# Patient Record
Sex: Female | Born: 1950
Health system: Southern US, Community
[De-identification: ages and names within clinical notes are randomized; demographics above are authoritative.]

## PROBLEM LIST (undated history)

## (undated) DIAGNOSIS — F5104 Psychophysiologic insomnia: Secondary | ICD-10-CM

## (undated) DIAGNOSIS — G4730 Sleep apnea, unspecified: Secondary | ICD-10-CM

## (undated) DIAGNOSIS — F329 Major depressive disorder, single episode, unspecified: Secondary | ICD-10-CM

## (undated) DIAGNOSIS — F32A Depression, unspecified: Secondary | ICD-10-CM

## (undated) DIAGNOSIS — R569 Unspecified convulsions: Secondary | ICD-10-CM

## (undated) DIAGNOSIS — F419 Anxiety disorder, unspecified: Secondary | ICD-10-CM

## (undated) DIAGNOSIS — I10 Essential (primary) hypertension: Secondary | ICD-10-CM

## (undated) DIAGNOSIS — G009 Bacterial meningitis, unspecified: Secondary | ICD-10-CM

## (undated) DIAGNOSIS — E785 Hyperlipidemia, unspecified: Secondary | ICD-10-CM

## (undated) DIAGNOSIS — G459 Transient cerebral ischemic attack, unspecified: Secondary | ICD-10-CM

## (undated) DIAGNOSIS — H269 Unspecified cataract: Secondary | ICD-10-CM

## (undated) DIAGNOSIS — G4733 Obstructive sleep apnea (adult) (pediatric): Secondary | ICD-10-CM

## (undated) DIAGNOSIS — Z9289 Personal history of other medical treatment: Secondary | ICD-10-CM

## (undated) DIAGNOSIS — R269 Unspecified abnormalities of gait and mobility: Secondary | ICD-10-CM

## (undated) DIAGNOSIS — R413 Other amnesia: Secondary | ICD-10-CM

## (undated) HISTORY — DX: Other amnesia: R41.3

## (undated) HISTORY — DX: Major depressive disorder, single episode, unspecified: F32.9

## (undated) HISTORY — DX: Hyperlipidemia, unspecified: E78.5

## (undated) HISTORY — DX: Essential (primary) hypertension: I10

## (undated) HISTORY — DX: Anxiety disorder, unspecified: F41.9

## (undated) HISTORY — DX: Unspecified convulsions: R56.9

## (undated) HISTORY — DX: Unspecified abnormalities of gait and mobility: R26.9

## (undated) HISTORY — DX: Sleep apnea, unspecified: G47.30

## (undated) HISTORY — DX: Depression, unspecified: F32.A

## (undated) HISTORY — DX: Obstructive sleep apnea (adult) (pediatric): G47.33

## (undated) HISTORY — DX: Unspecified cataract: H26.9

## (undated) HISTORY — DX: Bacterial meningitis, unspecified: G00.9

## (undated) HISTORY — DX: Personal history of other medical treatment: Z92.89

## (undated) HISTORY — DX: Psychophysiologic insomnia: F51.04

## (undated) HISTORY — PX: NO PAST SURGERIES: SHX2092

---

## 1999-12-08 ENCOUNTER — Encounter: Admission: RE | Admit: 1999-12-08 | Discharge: 1999-12-08 | Payer: Self-pay | Admitting: Family Medicine

## 1999-12-08 ENCOUNTER — Encounter: Payer: Self-pay | Admitting: Family Medicine

## 2000-12-15 ENCOUNTER — Encounter: Admission: RE | Admit: 2000-12-15 | Discharge: 2000-12-15 | Payer: Self-pay | Admitting: Family Medicine

## 2000-12-15 ENCOUNTER — Encounter: Payer: Self-pay | Admitting: Family Medicine

## 2001-12-19 ENCOUNTER — Encounter: Payer: Self-pay | Admitting: Family Medicine

## 2001-12-19 ENCOUNTER — Encounter: Admission: RE | Admit: 2001-12-19 | Discharge: 2001-12-19 | Payer: Self-pay | Admitting: Family Medicine

## 2002-12-26 ENCOUNTER — Encounter: Payer: Self-pay | Admitting: Family Medicine

## 2002-12-26 ENCOUNTER — Encounter: Admission: RE | Admit: 2002-12-26 | Discharge: 2002-12-26 | Payer: Self-pay | Admitting: Family Medicine

## 2003-07-03 ENCOUNTER — Ambulatory Visit (HOSPITAL_COMMUNITY): Admission: RE | Admit: 2003-07-03 | Discharge: 2003-07-03 | Payer: Self-pay | Admitting: *Deleted

## 2004-01-01 ENCOUNTER — Ambulatory Visit (HOSPITAL_COMMUNITY): Admission: RE | Admit: 2004-01-01 | Discharge: 2004-01-01 | Payer: Self-pay | Admitting: Family Medicine

## 2005-01-21 ENCOUNTER — Encounter: Admission: RE | Admit: 2005-01-21 | Discharge: 2005-01-21 | Payer: Self-pay | Admitting: Internal Medicine

## 2005-03-12 ENCOUNTER — Ambulatory Visit: Payer: Self-pay | Admitting: Internal Medicine

## 2005-04-02 ENCOUNTER — Encounter (INDEPENDENT_AMBULATORY_CARE_PROVIDER_SITE_OTHER): Payer: Self-pay | Admitting: *Deleted

## 2005-04-02 ENCOUNTER — Ambulatory Visit: Payer: Self-pay | Admitting: Internal Medicine

## 2006-01-24 ENCOUNTER — Encounter: Admission: RE | Admit: 2006-01-24 | Discharge: 2006-01-24 | Payer: Self-pay | Admitting: Internal Medicine

## 2007-05-08 ENCOUNTER — Encounter: Admission: RE | Admit: 2007-05-08 | Discharge: 2007-05-08 | Payer: Self-pay | Admitting: Internal Medicine

## 2008-05-08 ENCOUNTER — Encounter: Admission: RE | Admit: 2008-05-08 | Discharge: 2008-05-08 | Payer: Self-pay | Admitting: Internal Medicine

## 2009-05-15 ENCOUNTER — Encounter: Admission: RE | Admit: 2009-05-15 | Discharge: 2009-05-15 | Payer: Self-pay | Admitting: Internal Medicine

## 2010-05-18 ENCOUNTER — Encounter
Admission: RE | Admit: 2010-05-18 | Discharge: 2010-05-18 | Payer: Self-pay | Source: Home / Self Care | Attending: Internal Medicine | Admitting: Internal Medicine

## 2011-04-14 ENCOUNTER — Encounter (INDEPENDENT_AMBULATORY_CARE_PROVIDER_SITE_OTHER): Payer: Self-pay | Admitting: Internal Medicine

## 2011-04-14 DIAGNOSIS — E7800 Pure hypercholesterolemia, unspecified: Secondary | ICD-10-CM

## 2011-04-14 DIAGNOSIS — Z0000 Encounter for general adult medical examination without abnormal findings: Secondary | ICD-10-CM

## 2011-04-14 DIAGNOSIS — I10 Essential (primary) hypertension: Secondary | ICD-10-CM

## 2011-04-21 ENCOUNTER — Other Ambulatory Visit: Payer: Self-pay | Admitting: Internal Medicine

## 2011-04-21 DIAGNOSIS — Z1231 Encounter for screening mammogram for malignant neoplasm of breast: Secondary | ICD-10-CM

## 2011-04-29 ENCOUNTER — Ambulatory Visit (HOSPITAL_COMMUNITY)
Admission: RE | Admit: 2011-04-29 | Discharge: 2011-04-29 | Disposition: A | Discharge status: Home / Self Care | Payer: Federal, State, Local not specified - PPO | Source: Ambulatory Visit | Attending: Internal Medicine | Admitting: Internal Medicine

## 2011-04-29 DIAGNOSIS — E785 Hyperlipidemia, unspecified: Secondary | ICD-10-CM | POA: Insufficient documentation

## 2011-04-29 DIAGNOSIS — R0989 Other specified symptoms and signs involving the circulatory and respiratory systems: Secondary | ICD-10-CM

## 2011-04-29 NOTE — Progress Notes (Signed)
*  PRELIMINARY RESULTS* Carotid Duplex completed. No evidence of hemodynamically significant stenosis. Vertebral arteries are patent and flow is antegrade.  [name] [name], [place] D 04/29/2011, 11:27 AM

## 2011-05-20 ENCOUNTER — Ambulatory Visit
Admission: RE | Admit: 2011-05-20 | Discharge: 2011-05-20 | Disposition: A | Discharge status: Home / Self Care | Payer: Self-pay | Source: Ambulatory Visit | Attending: Internal Medicine | Admitting: Internal Medicine

## 2011-05-20 DIAGNOSIS — Z1231 Encounter for screening mammogram for malignant neoplasm of breast: Secondary | ICD-10-CM

## 2011-10-20 ENCOUNTER — Encounter: Payer: Self-pay | Admitting: Internal Medicine

## 2011-10-20 ENCOUNTER — Ambulatory Visit (INDEPENDENT_AMBULATORY_CARE_PROVIDER_SITE_OTHER): Payer: Federal, State, Local not specified - PPO | Admitting: Internal Medicine

## 2011-10-20 VITALS — BP 128/78 | HR 60 | Temp 97.6°F | Resp 16 | Ht 65.0 in | Wt 196.0 lb

## 2011-10-20 DIAGNOSIS — R252 Cramp and spasm: Secondary | ICD-10-CM

## 2011-10-20 DIAGNOSIS — I10 Essential (primary) hypertension: Secondary | ICD-10-CM

## 2011-10-20 DIAGNOSIS — E785 Hyperlipidemia, unspecified: Secondary | ICD-10-CM | POA: Insufficient documentation

## 2011-10-20 DIAGNOSIS — L259 Unspecified contact dermatitis, unspecified cause: Secondary | ICD-10-CM

## 2011-10-20 DIAGNOSIS — R0989 Other specified symptoms and signs involving the circulatory and respiratory systems: Secondary | ICD-10-CM

## 2011-10-20 DIAGNOSIS — L309 Dermatitis, unspecified: Secondary | ICD-10-CM

## 2011-10-20 DIAGNOSIS — Z6832 Body mass index (BMI) 32.0-32.9, adult: Secondary | ICD-10-CM

## 2011-10-20 DIAGNOSIS — D649 Anemia, unspecified: Secondary | ICD-10-CM

## 2011-10-20 LAB — CBC WITH DIFFERENTIAL/PLATELET
Eosinophils Absolute: 0.1 10*3/uL (ref 0.0–0.7)
Eosinophils Relative: 5 % (ref 0–5)
HCT: 37.6 % (ref 36.0–46.0)
Hemoglobin: 12.4 g/dL (ref 12.0–15.0)
Lymphocytes Relative: 51 % — ABNORMAL HIGH (ref 12–46)
Lymphs Abs: 1.5 10*3/uL (ref 0.7–4.0)
MCH: 25.7 pg — ABNORMAL LOW (ref 26.0–34.0)
MCV: 78 fL (ref 78.0–100.0)
Monocytes Absolute: 0.2 10*3/uL (ref 0.1–1.0)
Monocytes Relative: 8 % (ref 3–12)
Platelets: 257 10*3/uL (ref 150–400)
RBC: 4.82 MIL/uL (ref 3.87–5.11)
WBC: 2.9 10*3/uL — ABNORMAL LOW (ref 4.0–10.5)

## 2011-10-20 MED ORDER — FLUOCINONIDE-E 0.05 % EX CREA: TOPICAL_CREAM | Freq: Two times a day (BID) | CUTANEOUS | Status: AC

## 2011-10-20 NOTE — Progress Notes (Signed)
  Subjective:    Patient ID: [name] [name], female    DOB: December 15, 1950, 61 y.o.   MRN: [id number]  HPIQUIT Smoking!!!! Gained 10 lbs but trying On half 40 zocor Lisinopril-no side effects except recently some leg cramps at night Also has a rash on her left wrist it happens every summer especially wearing watch Lesions above eyelids bilaterally present for many months and she wonders what they are Was not allowed to give blood at the [facility] one month ago? Low iron No tarry stools/no melena/No epigastric distress   Review of Systems No fatigue or night sweats No vision changes/no hearing loss no shortness of breath No chest pain or palpitations No problems with digestion or changes in stool No urinary symptoms No neurological changes    Objective:   Physical Exam  Vital signs normal except weight HEENT clear/? Xanthelasmas Heart regular No edema Patch of eczema on left wrist/slight eczema under each mandible Neurological intact      Assessment & Plan:   1. Hyperlipemia  CBC with Differential, Lipid panel  2. Anemia  CBC with Differential, Iron and TIBC, Hemoccult - Multi Cards (take home)  3. HTN (hypertension)  Comprehensive metabolic panel  4. Leg cramps    5. Eczema  fluocinonide-emollient (LIDEX-E) 0.05 % cream  We'll prescribe lisinopril and Zocor after labs Check FE TIBC Hemoccult Start a walking program Congratulations on stopping smoking

## 2011-10-21 ENCOUNTER — Encounter: Payer: Self-pay | Admitting: Internal Medicine

## 2011-10-21 LAB — COMPREHENSIVE METABOLIC PANEL
Alkaline Phosphatase: 63 U/L (ref 39–117)
BUN: 18 mg/dL (ref 6–23)
CO2: 26 mEq/L (ref 19–32)
Glucose, Bld: 86 mg/dL (ref 70–99)
Sodium: 141 mEq/L (ref 135–145)
Total Bilirubin: 0.4 mg/dL (ref 0.3–1.2)
Total Protein: 6.8 g/dL (ref 6.0–8.3)

## 2011-10-21 LAB — LIPID PANEL
HDL: 77 mg/dL (ref >39)
LDL Cholesterol: 95 mg/dL (ref 0–99)
Total CHOL/HDL Ratio: 2.4 Ratio
VLDL: 14 mg/dL (ref 0–40)

## 2011-10-21 LAB — IRON AND TIBC
Iron: 134 ug/dL (ref 42–145)
UIBC: 229 ug/dL (ref 125–400)

## 2011-11-17 ENCOUNTER — Telehealth: Payer: Self-pay

## 2011-11-17 DIAGNOSIS — R21 Rash and other nonspecific skin eruption: Secondary | ICD-10-CM

## 2011-11-17 NOTE — Telephone Encounter (Signed)
Please refer her to a derm of her choice - or you can use Dr Elease Etienne office.

## 2011-11-17 NOTE — Telephone Encounter (Signed)
PT STATES SHE WAS GIVEN SOME MEDICINE FOR A RASH ON HER WRIST AND IT WENT AWAY, NOW IT HAS COME BY AROUND HER EYES AND SHE WOULD LIKE TO  BE REFERRED TO A DERM. PLEASE CALL [phone]

## 2011-11-17 NOTE — Telephone Encounter (Signed)
Called pt did not get answer, have sent referral to referrals pool and will get set up, just need to advise patient

## 2011-11-22 ENCOUNTER — Other Ambulatory Visit: Payer: Self-pay | Admitting: Internal Medicine

## 2011-11-22 DIAGNOSIS — Z0000 Encounter for general adult medical examination without abnormal findings: Secondary | ICD-10-CM

## 2011-11-23 ENCOUNTER — Other Ambulatory Visit: Payer: Self-pay

## 2011-11-23 DIAGNOSIS — D649 Anemia, unspecified: Secondary | ICD-10-CM

## 2011-11-23 LAB — IFOBT (OCCULT BLOOD): IFOBT: NEGATIVE

## 2012-02-08 ENCOUNTER — Other Ambulatory Visit: Payer: Self-pay | Admitting: Internal Medicine

## 2012-02-08 NOTE — Telephone Encounter (Signed)
PATIENT CALLED REQUESTING RX REFILL FOR 90 DAYS BECAUSE SHE IS LEAVING TO GO OUT OF TOWN. DR. Merla Riches IS HER PCP. THANK YOU!

## 2012-02-10 ENCOUNTER — Telehealth: Payer: Self-pay | Admitting: *Deleted

## 2012-02-10 NOTE — Telephone Encounter (Signed)
[name] [name] 02/08/2012 1:51 PM Signed  PATIENT CALLED REQUESTING RX REFILL FOR 90 DAYS BECAUSE SHE IS LEAVING TO GO OUT OF TOWN. DR. Merla Riches IS HER PCP. THANK YOU!

## 2012-02-10 NOTE — Telephone Encounter (Signed)
Notified pt on VM that her Rxs were sent to pharmacy for 90-day supplies.

## 2012-04-10 ENCOUNTER — Other Ambulatory Visit: Payer: Self-pay | Admitting: Internal Medicine

## 2012-04-10 DIAGNOSIS — Z1231 Encounter for screening mammogram for malignant neoplasm of breast: Secondary | ICD-10-CM

## 2012-04-12 ENCOUNTER — Ambulatory Visit (INDEPENDENT_AMBULATORY_CARE_PROVIDER_SITE_OTHER): Payer: Federal, State, Local not specified - PPO | Admitting: Internal Medicine

## 2012-04-12 ENCOUNTER — Encounter: Payer: Self-pay | Admitting: Internal Medicine

## 2012-04-12 VITALS — BP 121/78 | HR 71 | Temp 98.0°F | Resp 16 | Ht 65.5 in | Wt 220.0 lb

## 2012-04-12 DIAGNOSIS — D649 Anemia, unspecified: Secondary | ICD-10-CM

## 2012-04-12 DIAGNOSIS — E785 Hyperlipidemia, unspecified: Secondary | ICD-10-CM

## 2012-04-12 DIAGNOSIS — Z6832 Body mass index (BMI) 32.0-32.9, adult: Secondary | ICD-10-CM

## 2012-04-12 DIAGNOSIS — D509 Iron deficiency anemia, unspecified: Secondary | ICD-10-CM

## 2012-04-12 DIAGNOSIS — Z0000 Encounter for general adult medical examination without abnormal findings: Secondary | ICD-10-CM

## 2012-04-12 DIAGNOSIS — I10 Essential (primary) hypertension: Secondary | ICD-10-CM

## 2012-04-12 DIAGNOSIS — D709 Neutropenia, unspecified: Secondary | ICD-10-CM

## 2012-04-12 LAB — CBC WITH DIFFERENTIAL/PLATELET
Basophils Absolute: 0 10*3/uL (ref 0.0–0.1)
HCT: 38.3 % (ref 36.0–46.0)
Lymphocytes Relative: 50 % — ABNORMAL HIGH (ref 12–46)
Lymphs Abs: 1.7 10*3/uL (ref 0.7–4.0)
MCV: 78.6 fL (ref 78.0–100.0)
Neutro Abs: 1.4 10*3/uL — ABNORMAL LOW (ref 1.7–7.7)
Platelets: 295 10*3/uL (ref 150–400)
RBC: 4.87 MIL/uL (ref 3.87–5.11)
RDW: 13.8 % (ref 11.5–15.5)
WBC: 3.5 10*3/uL — ABNORMAL LOW (ref 4.0–10.5)

## 2012-04-12 LAB — COMPREHENSIVE METABOLIC PANEL
Alkaline Phosphatase: 68 U/L (ref 39–117)
BUN: 16 mg/dL (ref 6–23)
CO2: 27 mEq/L (ref 19–32)
Creat: 0.8 mg/dL (ref 0.50–1.10)
Glucose, Bld: 92 mg/dL (ref 70–99)
Total Bilirubin: 0.4 mg/dL (ref 0.3–1.2)

## 2012-04-12 LAB — LIPID PANEL
HDL: 83 mg/dL (ref >39)
Total CHOL/HDL Ratio: 2.3 Ratio
Triglycerides: 98 mg/dL (ref <150)

## 2012-04-12 LAB — IRON AND TIBC
%SAT: 30 % (ref 20–55)
Iron: 118 ug/dL (ref 42–145)
TIBC: 398 ug/dL (ref 250–470)
UIBC: 280 ug/dL (ref 125–400)

## 2012-04-12 MED ORDER — SIMVASTATIN 20 MG PO TABS: 20.0000 mg | ORAL_TABLET | Freq: Every day | ORAL | Status: DC

## 2012-04-12 MED ORDER — LISINOPRIL 10 MG PO TABS: 10.0000 mg | ORAL_TABLET | Freq: Every day | ORAL | Status: DC

## 2012-04-12 NOTE — Progress Notes (Signed)
  Subjective:    Patient ID: [name] [name], female    DOB: [birth year], 61 y.o.   MRN: [id number]  HPI    Review of Systems  HENT: Positive for postnasal drip.   Gastrointestinal: Positive for blood in stool.  Psychiatric/Behavioral: Positive for dysphoric mood. The patient is nervous/anxious.        Objective:   Physical Exam        Assessment & Plan:

## 2012-04-12 NOTE — Progress Notes (Signed)
  Subjective:    Patient ID: [name] [name], female    DOB: 08-Aug-1950, 61 y.o.   MRN: [id number]  HPI Patient Active Problem List  Diagnosis  . Hyperlipemia  . Anemia  . HTN (hypertension)  . 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   Gastrointestinal: Positive for blood in stool. once-last week///hx anemia- was [facility] in June/but [facility]//Hemoccult negative in June Cramps in fingers recently, resolved w/ increased water intake Psychiatric/Behavioral: Positive for dysphoric mood. The patient is nervous/anxious.   this is a social anxiety and she prefers to spend her time alone Remainder of ros negative except   eye exam wnl x early cataracts and need for reading glasses  Objective:   Physical Exam Filed Vitals:   04/12/12 1127  BP: 121/78  Pulse: 71  Temp: 98 F (36.7 C)  Resp: 16   weight 220 pounds Skin clear HEENT clear with no thyromegaly or lymphadenopathy Heart regular without murmur rubs or gallops/no carotid bruits even though mild stenosis known on one side through life screening Lungs clear Abdomen benign with no organomegaly or masses Extremities with no edema/good peripheral pulses/no sensory or motor losses Mildly tender over the right lumbar area but straight leg raise to 90 negative bilaterally Neurological intact Psychological-affect is tearful when discussing her unhappiness/depression does not cloud her activities and she has no self injury or suicide ideation        Assessment & Plan:  Annual examination  Patient Active Problem List  Diagnosis  . Hyperlipemia  . Anemia  . HTN (hypertension)  . BMI 32.0-32.9,adult    -  early cataracts   -   Lumbosacral spasm-recurrent   -  History of anemia  Routine labs including iron studies Med refills Focuses on weight loss Discussed happiness/she is against therapy/she wants no medication Pneumovax next year

## 2012-04-14 ENCOUNTER — Encounter: Payer: Self-pay | Admitting: Internal Medicine

## 2012-04-14 DIAGNOSIS — D709 Neutropenia, unspecified: Secondary | ICD-10-CM | POA: Insufficient documentation

## 2012-04-14 NOTE — Addendum Note (Signed)
Addended by: Tonye Pearson on: 04/14/2012 01:24 PM   Modules accepted: Orders

## 2012-05-23 ENCOUNTER — Ambulatory Visit
Admission: RE | Admit: 2012-05-23 | Discharge: 2012-05-23 | Disposition: A | Discharge status: Home / Self Care | Payer: Federal, State, Local not specified - PPO | Source: Ambulatory Visit | Attending: Internal Medicine | Admitting: Internal Medicine

## 2012-05-23 DIAGNOSIS — Z1231 Encounter for screening mammogram for malignant neoplasm of breast: Secondary | ICD-10-CM

## 2012-08-04 ENCOUNTER — Ambulatory Visit (HOSPITAL_COMMUNITY)
Admission: RE | Admit: 2012-08-04 | Discharge: 2012-08-04 | Disposition: A | Discharge status: Home / Self Care | Payer: Federal, State, Local not specified - PPO | Source: Ambulatory Visit | Attending: Family Medicine | Admitting: Family Medicine

## 2012-08-04 ENCOUNTER — Ambulatory Visit (INDEPENDENT_AMBULATORY_CARE_PROVIDER_SITE_OTHER): Payer: Federal, State, Local not specified - PPO | Admitting: Family Medicine

## 2012-08-04 VITALS — BP 140/80 | HR 78 | Temp 98.0°F | Resp 16 | Ht 66.0 in | Wt 234.0 lb

## 2012-08-04 DIAGNOSIS — I10 Essential (primary) hypertension: Secondary | ICD-10-CM

## 2012-08-04 DIAGNOSIS — R601 Generalized edema: Secondary | ICD-10-CM

## 2012-08-04 DIAGNOSIS — E785 Hyperlipidemia, unspecified: Secondary | ICD-10-CM

## 2012-08-04 DIAGNOSIS — M7989 Other specified soft tissue disorders: Secondary | ICD-10-CM

## 2012-08-04 DIAGNOSIS — R609 Edema, unspecified: Secondary | ICD-10-CM

## 2012-08-04 DIAGNOSIS — R635 Abnormal weight gain: Secondary | ICD-10-CM

## 2012-08-04 LAB — POCT CBC
Granulocyte percent: 41.9 %G (ref 37–80)
MCH, POC: 25.4 pg — AB (ref 27–31.2)
MID (cbc): 0.3 (ref 0–0.9)
MPV: 8.6 fL (ref 0–99.8)
POC LYMPH PERCENT: 49.9 %L (ref 10–50)
POC MID %: 8.2 %M (ref 0–12)
Platelet Count, POC: 285 10*3/uL (ref 142–424)
RBC: 4.85 M/uL (ref 4.04–5.48)
RDW, POC: 13.9 %

## 2012-08-04 NOTE — Patient Instructions (Addendum)
Go to admitting at [facility] - 1st floor.  Let them know you are there for an ultrasound and they will call the technologist. We will know the results tonight.    Your should receive a call or letter about your lab results within the next week to 10 days, but likely sooner, but can decrease salt/sodium in diet for now.   Allegra, claritin or zyrtec once per day for possible allergy cause of rash and stop dryer sheets.  Return to the clinic or go to the nearest emergency room if any of your symptoms worsen or new symptoms occur.  Plan on follow up in the next 4-5 days - sooner if swelling is worsening.

## 2012-08-04 NOTE — Progress Notes (Signed)
Subjective:    Patient ID: [name] [name], female    DOB: 18-Feb-1951, 62 y.o.   MRN: [id number]  HPI [name] [name] is a 62 y.o. female Hx of HTN, hyperlipidemia, chronic neutropenia, obesity.   R greater than L ankle swelling - improves in am. Past week noticed swelling, intially in R ankle only, now in L since yesterday. Notes pulling feeling in R calf muscle past few weeks.  Swelling this week - worse past few days. Has had ankle sprain in R side with swelling at times in past, but usually if standing on feet for prolonged time.  No chest pain, palpitations or shortness of breath.   Taken alleve few times for pain in the R calf - pulled muscle feeling in R calf - noticed after train ride form [place] on March 13th. 101/2 hours sitting still most of the time. No hx of blood clots.  No known fh of clotting disorder.    Weights: 196 10/20/11, 220 04/12/12, now 234.  Has started new diet this week, but no regular exercise prior.   Rash on face - off and on since November. Comes and goes next day. Stopped foundation, may be dryer sheet allergy.    No personal hx of MI, but FH of MI - mom in 80's, dad at 80.  No chest pains.    Review of Systems  Constitutional: Positive for unexpected weight change. Negative for fever and chills.  HENT: Negative for facial swelling.   Respiratory: Negative for chest tightness and shortness of breath.   Cardiovascular: Negative for chest pain and palpitations.  Musculoskeletal: Positive for myalgias.  Skin: Positive for rash (face. ).        Objective:   Physical Exam  Vitals reviewed. Constitutional: She is oriented to person, place, and time. She appears well-developed and well-nourished.  HENT:  Head: Normocephalic and atraumatic.  Eyes: Conjunctivae and EOM are normal. Pupils are equal, round, and reactive to light.  Neck: Carotid bruit is not present.  Cardiovascular: Normal rate, regular rhythm, normal heart sounds and intact distal pulses.    Pulmonary/Chest: Effort normal and breath sounds normal.  Abdominal: Soft. She exhibits no pulsatile midline mass. There is no tenderness.  Musculoskeletal:       Right knee: She exhibits normal range of motion and no swelling. No tenderness found.       Left knee: She exhibits normal range of motion and no swelling. No tenderness found.       Right lower leg: She exhibits tenderness and swelling (42 cm circumference at 15cm below patella vs 41 on right. ttp mid calf, negative homan.  nonpitting edema, but edematous lower  R leg. to ankle more than Left. ).  Neurological: She is alert and oriented to person, place, and time.  Skin: Skin is warm and dry. No bruising and no petechiae noted.     Psychiatric: She has a normal mood and affect. Her behavior is normal.   EKG: sinus bradycardia, rate 58.  no acute findings otherwise.      Assessment & Plan:  [name] [name] is a 62 y.o. female  Leg swelling - Plan: EKG 12-Lead, TSH, Basic metabolic panel, Brain natriuretic peptide, POCT CBC, Lower Extremity Venous Duplex Right, CANCELED: TSH, CANCELED: US Venous Img Lower Unilateral Right  Generalized edema - Plan: TSH, Basic metabolic panel, Brain natriuretic peptide, POCT CBC, Lower Extremity Venous Duplex Right, CANCELED: US Venous Img Lower Unilateral Right  Weight gain - Plan: TSH,  Basic metabolic panel, Brain natriuretic peptide, POCT CBC, CANCELED: US Venous Img Lower Unilateral Right  HTN (hypertension) - Plan: TSH, Basic metabolic panel, Brain natriuretic peptide, POCT CBC, CANCELED: US Venous Img Lower Unilateral Right  Other and unspecified hyperlipidemia - Plan: TSH, Basic metabolic panel, Brain natriuretic peptide, POCT CBC, CANCELED: US Venous Img Lower Unilateral Right  Edema - predominant in R lower leg, but noted in other areas,  Subjectively increasing, and increase in weigh over past year by prior weights.  Some of this edema may be stasis, and weight related, but will check  CBC, TSH, BMP, BNP (lungs clear, EKG without acute findings - both reassuring), and watch sodium in diet. Will check doppler of R calf tonight given pain and prior long train ride. Recheck next week to discuss edema workup, includin possible echo, but BNP pending at this point. ER/RTC precautions discussed.   Face rash - ?allergy vs contact derm - recurrent. Trial of antihistamine and recheck at next ov.   Patient Instructions  Go to admitting at [facility] - 1st floor.  Let them know you are there for an ultrasound and they will call the technologist. We will know the results tonight.    Your should receive a call or letter about your lab results within the next week to 10 days, but likely sooner, but can decrease salt/sodium in diet for now.   Allegra, claritin or zyrtec once per day for possible allergy cause of rash and stop dryer sheets.  Return to the clinic or go to the nearest emergency room if any of your symptoms worsen or new symptoms occur.  Plan on follow up in the next 4-5 days - sooner if swelling is worsening.     2040 addendum - called patient, had ultrasound performed and told no blood clot, report not yet available. Advised to rtc next week as planned, and will call with lab results.

## 2012-08-05 LAB — BASIC METABOLIC PANEL
BUN: 18 mg/dL (ref 6–23)
Chloride: 104 mEq/L (ref 96–112)
Glucose, Bld: 91 mg/dL (ref 70–99)
Sodium: 140 mEq/L (ref 135–145)

## 2012-08-05 LAB — TSH: TSH: 1.977 u[IU]/mL (ref 0.350–4.500)

## 2012-08-10 ENCOUNTER — Telehealth: Payer: Self-pay | Admitting: Radiology

## 2012-08-10 ENCOUNTER — Other Ambulatory Visit: Payer: Self-pay | Admitting: Radiology

## 2012-08-10 ENCOUNTER — Ambulatory Visit (INDEPENDENT_AMBULATORY_CARE_PROVIDER_SITE_OTHER): Payer: Federal, State, Local not specified - PPO | Admitting: Family Medicine

## 2012-08-10 VITALS — BP 130/84 | HR 80 | Temp 98.4°F | Resp 18 | Wt 229.0 lb

## 2012-08-10 DIAGNOSIS — M7989 Other specified soft tissue disorders: Secondary | ICD-10-CM

## 2012-08-10 DIAGNOSIS — R609 Edema, unspecified: Secondary | ICD-10-CM

## 2012-08-10 DIAGNOSIS — R600 Localized edema: Secondary | ICD-10-CM

## 2012-08-10 NOTE — Progress Notes (Signed)
Subjective:    Patient ID: Foy Gu[place], female    DOB: Nov 05, 1950, 62 y.o.   MRN: [id number]  HPI LAMAE FOSCO is a 62 y.o. female Hx of HTN, hyperlipidemia, chronic neutropenia, obesity.  See last ov 08/04/12 - in summary -  R greater than L ankle swelling - improves in am. Prior week noticed swelling, intially in R ankle only, then in Left for 1 day then. Had ankle sprain in R side with swelling at times in past, but usually if standing on feet for prolonged time.  No chest pain, palpitations or shortness of breath.   Taken alleve few times for pain in the R calf - pulled muscle feeling in R calf - noticed after train ride form [place] on March 13th. 101/2 hours sitting still most of the time. No hx of blood clots.  No known fh of clotting disorder.    Weights: 196 10/20/11, 220 04/12/12, now 234.  Has started new diet prior week, but no regular exercise prior.   Results for orders placed in visit on 08/04/12  TSH      Result Value Range   TSH 1.977  0.350 - 4.500 uIU/mL  BASIC METABOLIC PANEL      Result Value Range   Sodium 140  135 - 145 mEq/L   Potassium 4.1  3.5 - 5.3 mEq/L   Chloride 104  96 - 112 mEq/L   CO2 27  19 - 32 mEq/L   Glucose, Bld 91  70 - 99 mg/dL   BUN 18  6 - 23 mg/dL   Creat 1.19  1.47 - 8.29 mg/dL   Calcium 9.6  8.4 - 56.2 mg/dL  BRAIN NATRIURETIC PEPTIDE      Result Value Range   Brain Natriuretic Peptide 50.4  0.0 - 100.0 pg/mL  POCT CBC      Result Value Range   WBC 4.1 (*) 4.6 - 10.2 K/uL   Lymph, poc 2.0  0.6 - 3.4   POC LYMPH PERCENT 49.9  10 - 50 %L   MID (cbc) 0.3  0 - 0.9   POC MID % 8.2  0 - 12 %M   POC Granulocyte 1.7 (*) 2 - 6.9   Granulocyte percent 41.9  37 - 80 %G   RBC 4.85  4.04 - 5.48 M/uL   Hemoglobin 12.3  12.2 - 16.2 g/dL   HCT, POC 13.0  86.5 - 47.9 %   MCV 82.9  80 - 97 fL   MCH, POC 25.4 (*) 27 - 31.2 pg   MCHC 30.6 (*) 31.8 - 35.4 g/dL   RDW, POC 78.4     Platelet Count, POC 285  142 - 424 K/uL   MPV 8.6  0 - 99.8  fL   Doppler of RLE negative for DVT by report 08/04/12.    Here for follow up. Weight 234 last ov - 229 today. Swelling has improved. Thinks cause was sea salt and saltine crackers to salads.  Has since cut back on sodium since yesterday.  Better in the mornings, and no swelling.  Slight swelling if on legs all day.  Pain in calf resolved, no chest pain/DOE/SOB.     Review of Systems  Respiratory: Negative for chest tightness and shortness of breath.   Cardiovascular: Negative for chest pain and palpitations. Leg swelling: as above.   Musculoskeletal: Negative for gait problem.       Objective:   Physical Exam  Vitals reviewed. Constitutional: She is  oriented to person, place, and time. She appears well-developed and well-nourished.  HENT:  Head: Normocephalic and atraumatic.  Eyes: Conjunctivae and EOM are normal. Pupils are equal, round, and reactive to light.  Neck: Carotid bruit is not present.  Cardiovascular: Normal rate, regular rhythm, normal heart sounds and intact distal pulses.  Exam reveals no gallop.   No murmur heard. Pulmonary/Chest: Effort normal and breath sounds normal.  Abdominal: She exhibits no pulsatile midline mass. There is no tenderness.  Musculoskeletal: She exhibits edema (trace non-pitting edema r greater than L ankle. ). She exhibits no tenderness (calves nt. ).  Neurological: She is alert and oriented to person, place, and time.  Skin: Skin is warm and dry.  Psychiatric: She has a normal mood and affect. Her behavior is normal.       Assessment & Plan:  RHYA SHAN is a 62 y.o. female Lower leg edema improving.  Suspect weight gain/obesity as contributor, and prior ankle injury for discrepancy of R vs. L sided predominance of edema. reassurring labs and doppler prior. Improving. Cont to watch salt as below, weight loss,  recheck with PCP in 2 months - sooner if any new/worseing of sx's. understanding expressed.   Patient Instructions  Continue  to watch sodium intake.  There is a low sodium diet below for your reference. Follow up with Dr. Merla Riches in the next 2 months. If the swelling is worsening, or feeling more short of breath, or any worsening- recheck sooner.  Return to the clinic or go to the nearest emergency room if any of your symptoms worsen or new symptoms occur.  DASH Diet The DASH diet stands for "Dietary Approaches to Stop Hypertension." It is a healthy eating plan that has been shown to reduce high blood pressure (hypertension) in as little as 14 days, while also possibly providing other significant health benefits. These other health benefits include reducing the risk of breast cancer after menopause and reducing the risk of type 2 diabetes, heart disease, colon cancer, and stroke. Health benefits also include weight loss and slowing kidney failure in patients with chronic kidney disease.  DIET GUIDELINES  Limit salt (sodium). Your diet should contain less than 1500 mg of sodium daily.  Limit refined or processed carbohydrates. Your diet should include mostly whole grains. Desserts and added sugars should be used sparingly.  Include small amounts of heart-healthy fats. These types of fats include nuts, oils, and tub margarine. Limit saturated and trans fats. These fats have been shown to be harmful in the body. CHOOSING FOODS  The following food groups are based on a 2000 calorie diet. See your Registered Dietitian for individual calorie needs. Grains and Grain Products (6 to 8 servings daily)  Eat More Often: Whole-wheat bread, brown rice, whole-grain or wheat pasta, quinoa, popcorn without added fat or salt (air popped).  Eat Less Often: White bread, white pasta, white rice, cornbread. Vegetables (4 to 5 servings daily)  Eat More Often: Fresh, frozen, and canned vegetables. Vegetables may be raw, steamed, roasted, or grilled with a minimal amount of fat.  Eat Less Often/Avoid: Creamed or fried vegetables.  Vegetables in a cheese sauce. Fruit (4 to 5 servings daily)  Eat More Often: All fresh, canned (in natural juice), or frozen fruits. Dried fruits without added sugar. One hundred percent fruit juice ( cup [237 mL] daily).  Eat Less Often: Dried fruits with added sugar. Canned fruit in light or heavy syrup. [employer], Fish, and Poultry (2 servings or less  daily. One serving is 3 to 4 oz [85-114 g]).  Eat More Often: Ninety percent or leaner ground beef, tenderloin, sirloin. Round cuts of beef, chicken breast, [place] breast. All fish. Grill, bake, or broil your meat. Nothing should be fried.  Eat Less Often/Avoid: Fatty cuts of meat, [place], or chicken leg, thigh, or wing. Fried cuts of meat or fish. Dairy (2 to 3 servings)  Eat More Often: Low-fat or fat-free milk, low-fat plain or light yogurt, reduced-fat or part-skim cheese.  Eat Less Often/Avoid: Milk (whole, 2%).Whole milk yogurt. Full-fat cheeses. Nuts, Seeds, and Legumes (4 to 5 servings per week)  Eat More Often: All without added salt.  Eat Less Often/Avoid: Salted nuts and seeds, canned beans with added salt. Fats and Sweets (limited)  Eat More Often: Vegetable oils, tub margarines without trans fats, sugar-free gelatin. Mayonnaise and salad dressings.  Eat Less Often/Avoid: Coconut oils, palm oils, butter, stick margarine, cream, half and half, cookies, candy, pie. FOR MORE INFORMATION The Dash Diet Eating Plan: www.dashdiet.org Document Released: 04/08/2011 Document Revised: 07/12/2011 Document Reviewed: 04/08/2011 [facility] Patient Information 2013 [place], [place].

## 2012-08-10 NOTE — Patient Instructions (Signed)
Continue to watch sodium intake.  There is a low sodium diet below for your reference. Follow up with Dr. Merla Riches in the next 2 months. If the swelling is worsening, or feeling more short of breath, or any worsening- recheck sooner.  Return to the clinic or go to the nearest emergency room if any of your symptoms worsen or new symptoms occur.  DASH Diet The DASH diet stands for "Dietary Approaches to Stop Hypertension." It is a healthy eating plan that has been shown to reduce high blood pressure (hypertension) in as little as 14 days, while also possibly providing other significant health benefits. These other health benefits include reducing the risk of breast cancer after menopause and reducing the risk of type 2 diabetes, heart disease, colon cancer, and stroke. Health benefits also include weight loss and slowing kidney failure in patients with chronic kidney disease.  DIET GUIDELINES  Limit salt (sodium). Your diet should contain less than 1500 mg of sodium daily.  Limit refined or processed carbohydrates. Your diet should include mostly whole grains. Desserts and added sugars should be used sparingly.  Include small amounts of heart-healthy fats. These types of fats include nuts, oils, and tub margarine. Limit saturated and trans fats. These fats have been shown to be harmful in the body. CHOOSING FOODS  The following food groups are based on a 2000 calorie diet. See your Registered Dietitian for individual calorie needs. Grains and Grain Products (6 to 8 servings daily)  Eat More Often: Whole-wheat bread, brown rice, whole-grain or wheat pasta, quinoa, popcorn without added fat or salt (air popped).  Eat Less Often: White bread, white pasta, white rice, cornbread. Vegetables (4 to 5 servings daily)  Eat More Often: Fresh, frozen, and canned vegetables. Vegetables may be raw, steamed, roasted, or grilled with a minimal amount of fat.  Eat Less Often/Avoid: Creamed or fried  vegetables. Vegetables in a cheese sauce. Fruit (4 to 5 servings daily)  Eat More Often: All fresh, canned (in natural juice), or frozen fruits. Dried fruits without added sugar. One hundred percent fruit juice ( cup [237 mL] daily).  Eat Less Often: Dried fruits with added sugar. Canned fruit in light or heavy syrup. [employer], Fish, and Poultry (2 servings or less daily. One serving is 3 to 4 oz [85-114 g]).  Eat More Often: Ninety percent or leaner ground beef, tenderloin, sirloin. Round cuts of beef, chicken breast, [place] breast. All fish. Grill, bake, or broil your meat. Nothing should be fried.  Eat Less Often/Avoid: Fatty cuts of meat, [place], or chicken leg, thigh, or wing. Fried cuts of meat or fish. Dairy (2 to 3 servings)  Eat More Often: Low-fat or fat-free milk, low-fat plain or light yogurt, reduced-fat or part-skim cheese.  Eat Less Often/Avoid: Milk (whole, 2%).Whole milk yogurt. Full-fat cheeses. Nuts, Seeds, and Legumes (4 to 5 servings per week)  Eat More Often: All without added salt.  Eat Less Often/Avoid: Salted nuts and seeds, canned beans with added salt. Fats and Sweets (limited)  Eat More Often: Vegetable oils, tub margarines without trans fats, sugar-free gelatin. Mayonnaise and salad dressings.  Eat Less Often/Avoid: Coconut oils, palm oils, butter, stick margarine, cream, half and half, cookies, candy, pie. FOR MORE INFORMATION The Dash Diet Eating Plan: www.dashdiet.org Document Released: 04/08/2011 Document Revised: 07/12/2011 Document Reviewed: 04/08/2011 [facility] Patient Information 2013 [place], [place].

## 2012-08-11 NOTE — Telephone Encounter (Signed)
Have discussed with vascular lab we can not see the report of the doppler study, they needed order put back in, states was cancelled, reordered so they can attatch the report.

## 2012-10-11 ENCOUNTER — Ambulatory Visit: Payer: Federal, State, Local not specified - PPO

## 2012-10-11 ENCOUNTER — Ambulatory Visit (INDEPENDENT_AMBULATORY_CARE_PROVIDER_SITE_OTHER): Payer: Federal, State, Local not specified - PPO | Admitting: Internal Medicine

## 2012-10-11 VITALS — BP 121/80 | HR 75 | Temp 98.3°F | Resp 18 | Ht 66.0 in | Wt 221.0 lb

## 2012-10-11 DIAGNOSIS — R0602 Shortness of breath: Secondary | ICD-10-CM

## 2012-10-11 DIAGNOSIS — E785 Hyperlipidemia, unspecified: Secondary | ICD-10-CM

## 2012-10-11 DIAGNOSIS — R5383 Other fatigue: Secondary | ICD-10-CM

## 2012-10-11 DIAGNOSIS — I10 Essential (primary) hypertension: Secondary | ICD-10-CM

## 2012-10-11 DIAGNOSIS — Z6832 Body mass index (BMI) 32.0-32.9, adult: Secondary | ICD-10-CM

## 2012-10-11 DIAGNOSIS — F329 Major depressive disorder, single episode, unspecified: Secondary | ICD-10-CM

## 2012-10-11 DIAGNOSIS — R0789 Other chest pain: Secondary | ICD-10-CM

## 2012-10-11 DIAGNOSIS — F341 Dysthymic disorder: Secondary | ICD-10-CM

## 2012-10-11 DIAGNOSIS — D649 Anemia, unspecified: Secondary | ICD-10-CM

## 2012-10-11 DIAGNOSIS — R5381 Other malaise: Secondary | ICD-10-CM
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
## 2012-10-12 DIAGNOSIS — F329 Major depressive disorder, single episode, unspecified: Secondary | ICD-10-CM | POA: Insufficient documentation

## 2012-10-12 LAB — LIPID PANEL
Cholesterol: 154 mg/dL (ref 0–200)
HDL: 68 mg/dL (ref >39)
Total CHOL/HDL Ratio: 2.3 Ratio
Triglycerides: 56 mg/dL (ref <150)
VLDL: 11 mg/dL (ref 0–40)

## 2012-10-12 LAB — COMPREHENSIVE METABOLIC PANEL
ALT: 14 U/L (ref 0–35)
AST: 17 U/L (ref 0–37)
Albumin: 4.3 g/dL (ref 3.5–5.2)
Alkaline Phosphatase: 59 U/L (ref 39–117)
Glucose, Bld: 92 mg/dL (ref 70–99)
Potassium: 4.3 mEq/L (ref 3.5–5.3)
Sodium: 140 mEq/L (ref 135–145)
Total Bilirubin: 0.4 mg/dL (ref 0.3–1.2)
Total Protein: 7.1 g/dL (ref 6.0–8.3)

## 2012-10-12 LAB — CBC WITH DIFFERENTIAL/PLATELET
Basophils Relative: 1 % (ref 0–1)
Eosinophils Absolute: 0.1 10*3/uL (ref 0.0–0.7)
Hemoglobin: 11.9 g/dL — ABNORMAL LOW (ref 12.0–15.0)
MCHC: 32 g/dL (ref 30.0–36.0)
Monocytes Relative: 11 % (ref 3–12)
Neutro Abs: 1.3 10*3/uL — ABNORMAL LOW (ref 1.7–7.7)
Neutrophils Relative %: 41 % — ABNORMAL LOW (ref 43–77)
Platelets: 285 10*3/uL (ref 150–400)
RBC: 4.8 MIL/uL (ref 3.87–5.11)

## 2012-10-12 MED ORDER — ESCITALOPRAM OXALATE 10 MG PO TABS: 10.0000 mg | ORAL_TABLET | Freq: Every day | ORAL | Status: DC

## 2012-10-15 ENCOUNTER — Encounter: Payer: Self-pay | Admitting: Internal Medicine

## 2012-10-19 ENCOUNTER — Telehealth: Payer: Self-pay

## 2012-10-19 NOTE — Telephone Encounter (Signed)
Called her. Patient states she has been crying a lot. She has not taken the medication because she is concerned about the package insert and she states she has taken Lexapro in the past with out problems I advised her the medication given is generic form of Lexapro. She is concerned about her blood pressure with this medication. She states she wants to see the cardiologist prior to starting the Lexapro.I told her this will be fine. She will speak to the cardiologist about it when she sees him.  To you FYI

## 2012-10-19 NOTE — Telephone Encounter (Signed)
Pt left a lengthy message afterhours at the billing office last night. Wanted to talk to Dr. Merla Riches as she has some concerns about a recent anxiety rx he prescribed and also about a referral they discussed.

## 2012-12-01 ENCOUNTER — Telehealth: Payer: Self-pay

## 2012-12-01 NOTE — Telephone Encounter (Signed)
Pt states that she pulled a muscle today and would like to have Dr.Doolittle call her in a muscle relaxer. Pharmacy:[facility] [phone]

## 2012-12-02 MED ORDER — CYCLOBENZAPRINE HCL 5 MG PO TABS: 5.0000 mg | ORAL_TABLET | Freq: Three times a day (TID) | ORAL | Status: DC | PRN

## 2012-12-11 ENCOUNTER — Ambulatory Visit (INDEPENDENT_AMBULATORY_CARE_PROVIDER_SITE_OTHER): Payer: Federal, State, Local not specified - PPO | Admitting: Cardiovascular Disease

## 2012-12-11 ENCOUNTER — Encounter: Payer: Self-pay | Admitting: Cardiovascular Disease

## 2012-12-11 ENCOUNTER — Ambulatory Visit (HOSPITAL_COMMUNITY): Payer: Federal, State, Local not specified - PPO | Attending: Cardiovascular Disease | Admitting: Radiology

## 2012-12-11 ENCOUNTER — Institutional Professional Consult (permissible substitution): Payer: Federal, State, Local not specified - PPO | Admitting: Cardiovascular Disease

## 2012-12-11 VITALS — BP 138/74 | HR 79 | Ht 66.0 in | Wt 225.0 lb

## 2012-12-11 DIAGNOSIS — Z87891 Personal history of nicotine dependence: Secondary | ICD-10-CM | POA: Insufficient documentation

## 2012-12-11 DIAGNOSIS — I10 Essential (primary) hypertension: Secondary | ICD-10-CM | POA: Insufficient documentation

## 2012-12-11 DIAGNOSIS — E785 Hyperlipidemia, unspecified: Secondary | ICD-10-CM | POA: Insufficient documentation

## 2012-12-11 DIAGNOSIS — R0600 Dyspnea, unspecified: Secondary | ICD-10-CM

## 2012-12-11 DIAGNOSIS — R079 Chest pain, unspecified: Secondary | ICD-10-CM

## 2012-12-11 DIAGNOSIS — R0602 Shortness of breath: Secondary | ICD-10-CM | POA: Insufficient documentation

## 2012-12-11 DIAGNOSIS — R0609 Other forms of dyspnea: Secondary | ICD-10-CM

## 2012-12-11 NOTE — Progress Notes (Signed)
Echocardiogram performed.  

## 2012-12-11 NOTE — Patient Instructions (Addendum)
Your physician recommends that you schedule a follow-up appointment in: about 6 weeks.   Your physician has requested that you have an echocardiogram. Echocardiography is a painless test that uses sound waves to create images of your heart. It provides your doctor with information about the size and shape of your heart and how well your heart's chambers and valves are working. This procedure takes approximately one hour. There are no restrictions for this procedure.   Your physician has requested that you have an exercise stress myoview. For further information please visit [email or web address]. Please follow instruction sheet, as given.  

## 2012-12-11 NOTE — Progress Notes (Addendum)
History of Present Illness: 62 yo female with history of depression and anxiety who is referred today for evaluation of fatigue, dyspnea and chest pressure. She tells me that she had swelling in her feet and hands in April 2014, lower ext venous dopplers negative at that time. She describes some chest pressure and dyspnea over the last year. She has a strong family history of CAD. She smoked for 40 years, stopping in 2013. No prior cardiac disease. No near syncope or syncope.   Primary Care Physician: Merla Riches  Last Lipid Profile:Lipid Panel     Component Value Date/Time   CHOL 154 10/11/2012 1211   TRIG 56 10/11/2012 1211   HDL 68 10/11/2012 1211   CHOLHDL 2.3 10/11/2012 1211   VLDL 11 10/11/2012 1211   LDLCALC 75 10/11/2012 1211     Past Medical History  Diagnosis Date  . Cataract   . Depression   . Anxiety   . HTN (hypertension)   . Hyperlipemia     Past Surgical History  Procedure Laterality Date  . Cyst removal bilateral wrists      Current Outpatient Prescriptions  Medication Sig Dispense Refill  . aspirin 81 MG tablet Take 81 mg by mouth daily.      . cyclobenzaprine (FLEXERIL) 5 MG tablet Take 1 tablet (5 mg total) by mouth 3 (three) times daily as needed for muscle spasms.  30 tablet  1  . escitalopram (LEXAPRO) 10 MG tablet Take 1 tablet (10 mg total) by mouth daily.  30 tablet  5  . lisinopril (PRINIVIL,ZESTRIL) 10 MG tablet Take 1 tablet (10 mg total) by mouth daily.  90 tablet  3  . Multiple Vitamin (MULTIVITAMIN) tablet Take 1 tablet by mouth daily.      . simvastatin (ZOCOR) 20 MG tablet Take 1 tablet (20 mg total) by mouth daily.  90 tablet  3   No current facility-administered medications for this visit.    No Known Allergies  History   Social History  . Marital Status: Divorced    Spouse Name: N/A    Number of Children: 1  . Years of Education: N/A   Occupational History  . Retired [employer]    Social History Main Topics  . Smoking status: Former  Smoker -- 0.50 packs/day for 40 years    Types: Cigarettes    Quit date: 07/09/2011  . Smokeless tobacco: Not on file  . Alcohol Use: No  . Drug Use: No  . Sexually Active: No   Other Topics Concern  . Not on file   Social History Narrative  . No narrative on file    Family History  Problem Relation Age of Onset  . Heart attack Mother 73  . Stroke Mother   . Hypertension Mother   . Heart attack Father 54    Review of Systems:  As stated in the HPI and otherwise negative.   BP 138/74  Pulse 79  Ht 5\' 6"  (1.676 m)  Wt 225 lb (102.059 kg)  BMI 36.33 kg/m2  Physical Examination: General: Well developed, well nourished, NAD HEENT: OP clear, mucus membranes moist SKIN: warm, dry. No rashes. Neuro: No focal deficits Musculoskeletal: Muscle strength 5/5 all ext Psychiatric: Mood and affect normal Neck: No JVD, no carotid bruits, no thyromegaly, no lymphadenopathy. Lungs:Clear bilaterally, no wheezes, rhonci, crackles Cardiovascular: Regular rate and rhythm. No murmurs, gallops or rubs. Abdomen:Soft. Bowel sounds present. Non-tender.  Extremities: No lower extremity edema. Pulses are 2 + in the bilateral  DP/PT.  EKG:  NSR, rate 72 bpm.   Assessment and Plan:   1. Chest pain: Many risk factors for CAD including HTN, HLD, tobacco abuse, FH of CAD, obesity. She has had some exertional chest pressure and dyspnea. Given her risk factors, will arrange exercise stress myoview to exclude ischemia and echo to exclude structural heart disease, assess LVEF.   2. Dyspnea: As above.

## 2012-12-13 ENCOUNTER — Ambulatory Visit (HOSPITAL_COMMUNITY): Payer: Federal, State, Local not specified - PPO | Attending: Cardiovascular Disease | Admitting: Radiology

## 2012-12-13 VITALS — BP 138/89 | HR 63 | Ht 66.0 in | Wt 226.0 lb

## 2012-12-13 DIAGNOSIS — R5381 Other malaise: Secondary | ICD-10-CM | POA: Insufficient documentation

## 2012-12-13 DIAGNOSIS — R0600 Dyspnea, unspecified: Secondary | ICD-10-CM

## 2012-12-13 DIAGNOSIS — R0789 Other chest pain: Secondary | ICD-10-CM | POA: Insufficient documentation

## 2012-12-13 DIAGNOSIS — I10 Essential (primary) hypertension: Secondary | ICD-10-CM | POA: Insufficient documentation

## 2012-12-13 DIAGNOSIS — R079 Chest pain, unspecified: Secondary | ICD-10-CM

## 2012-12-13 DIAGNOSIS — R5383 Other fatigue: Secondary | ICD-10-CM | POA: Insufficient documentation

## 2012-12-13 DIAGNOSIS — R0989 Other specified symptoms and signs involving the circulatory and respiratory systems: Secondary | ICD-10-CM | POA: Insufficient documentation

## 2012-12-13 DIAGNOSIS — E669 Obesity, unspecified: Secondary | ICD-10-CM | POA: Insufficient documentation

## 2012-12-13 DIAGNOSIS — E785 Hyperlipidemia, unspecified: Secondary | ICD-10-CM

## 2012-12-13 DIAGNOSIS — R0602 Shortness of breath: Secondary | ICD-10-CM | POA: Insufficient documentation

## 2012-12-13 DIAGNOSIS — R0609 Other forms of dyspnea: Secondary | ICD-10-CM | POA: Insufficient documentation

## 2012-12-13 MED ORDER — TECHNETIUM TC 99M SESTAMIBI GENERIC - CARDIOLITE
33.0000 | Freq: Once | INTRAVENOUS | Status: AC | PRN
  Administered 2012-12-13: 33 via INTRAVENOUS

## 2012-12-13 MED ORDER — TECHNETIUM TC 99M SESTAMIBI GENERIC - CARDIOLITE
11.0000 | Freq: Once | INTRAVENOUS | Status: AC | PRN
  Administered 2012-12-13: 11 via INTRAVENOUS

## 2012-12-13 NOTE — Progress Notes (Signed)
[facility] SITE 3 NUCLEAR MED [address] [place], [place] [address] [phone]    Cardiology Nuclear Med Study  [name] [name] is a 62 y.o. female     MRN : [id number]     DOB: 12/31/1950  Procedure Date: 12/13/2012  Nuclear Med Background Indication for Stress Test:  Evaluation for Ischemia History:  No previously documented CAD. Cardiac Risk Factors: Family History - CAD, History of Smoking, Hypertension, Lipids and Obesity  Symptoms:  Chest Pressure with Exertion (last episode of chest discomfort was in May of this year), DOE, Fatigue and SOB   Nuclear Pre-Procedure Caffeine/Decaff Intake:  None NPO After: 5 pm   Lungs:  Clear. O2 Sat: 97% on room air. IV 0.9% NS with Angio Cath:  22g  IV Site: R Hand  IV Started by:  Bonnita Levan, RN  Chest Size (in):  38 Cup Size: B  Height: 5\' 6"  (1.676 m)  Weight:  226 lb (102.513 kg)  BMI:  Body mass index is 36.49 kg/(m^2). Tech Comments:  N/A    Nuclear Med Study 1 or 2 day study: 1 day  Stress Test Type:  Stress  Reading MD: Charlton Haws, MD  Order Authorizing Provider:  Verne Carrow, MD  Resting Radionuclide: Technetium 59m Sestamibi  Resting Radionuclide Dose: 11.0 mCi   Stress Radionuclide:  Technetium 108m Sestamibi  Stress Radionuclide Dose: 33.0 mCi           Stress Protocol Rest HR: 63 Stress HR: 142  Rest BP: 138/89 Stress BP: 224/77  (pt. on med's)  Exercise Time (min): 7:00 METS: 7.0   Predicted Max HR: 159 bpm % Max HR: 89.31 bpm Rate Pressure Product: 57846   Dose of Adenosine (mg):  n/a Dose of Lexiscan: n/a mg  Dose of Atropine (mg): n/a Dose of Dobutamine: n/a mcg/kg/min (at max HR)  Stress Test Technologist: Smiley Houseman, CMA-N  Nuclear Technologist:  Doyne Keel, CNMT     Rest Procedure:  Myocardial perfusion imaging was performed at rest 45 minutes following the intravenous administration of Technetium 42m Sestamibi.  Rest ECG: NSR - Normal EKG  Stress Procedure:  The  patient exercised on the treadmill utilizing the Bruce Protocol for seven minutes. The patient stopped due to fatigue and denied any chest pain.  Technetium 36m Sestamibi was injected at peak exercise and myocardial perfusion imaging was performed after a brief delay.  Stress ECG: No significant change from baseline ECG  QPS Raw Data Images:  Normal; no motion artifact; normal heart/lung ratio. Stress Images:  Normal homogeneous uptake in all areas of the myocardium. Rest Images:  Normal homogeneous uptake in all areas of the myocardium. Subtraction (SDS):  Normal Transient Ischemic Dilatation (Normal <1.22):  n/a Lung/Heart Ratio (Normal <0.45):  0.38  Quantitative Gated Spect Images QGS EDV:  75 ml QGS ESV:  20 ml  Impression Exercise Capacity:  Fair exercise capacity. BP Response:  Hypertensive blood pressure response. Clinical Symptoms:  There is dyspnea. ECG Impression:  No significant ST segment change suggestive of ischemia. Comparison with Prior Nuclear Study: No previous nuclear study performed  Overall Impression:  Normal stress nuclear study.  LV Ejection Fraction: 74%.  LV Wall Motion:  NL LV Function; NL Wall Motion   Charlton Haws

## 2013-01-25 ENCOUNTER — Ambulatory Visit (INDEPENDENT_AMBULATORY_CARE_PROVIDER_SITE_OTHER): Payer: Federal, State, Local not specified - PPO | Admitting: Cardiovascular Disease

## 2013-01-25 ENCOUNTER — Encounter: Payer: Self-pay | Admitting: Cardiovascular Disease

## 2013-01-25 VITALS — BP 138/90 | HR 66 | Ht 66.0 in | Wt 230.8 lb

## 2013-01-25 DIAGNOSIS — R0600 Dyspnea, unspecified: Secondary | ICD-10-CM

## 2013-01-25 DIAGNOSIS — R079 Chest pain, unspecified: Secondary | ICD-10-CM

## 2013-01-25 DIAGNOSIS — R0609 Other forms of dyspnea: Secondary | ICD-10-CM

## 2013-01-25 NOTE — Patient Instructions (Addendum)
Your physician wants you to follow-up in:  12 months.  You will receive a reminder letter in the mail two months in advance. If you don't receive a letter, please call our office to schedule the follow-up appointment.   

## 2013-01-25 NOTE — Progress Notes (Signed)
History of Present Illness: 62 yo female with history of HTN, HLD, depression, former tobacco abuse and anxiety who is here today for cardiac follow up. I saw her as a new patient 12/11/12 for evaluation of fatigue, dyspnea and chest pressure. She told me that she had swelling in her feet and hands in April 2014, lower ext venous dopplers negative at that time. She described chest pressure and dyspnea over the last year. She has a strong family history of CAD. She smoked for 40 years, stopping in 2013. No prior cardiac disease. No near syncope or syncope. I arranged a stress myoview on 12/15/12 which showed no ischemia. Echo 12/11/12 with no valvular disease, normal LV function.   She is here today for follow up. She has been walking every day. No chest pain or SOB. Feeling great. She is anxious to lose weight.   Primary Care Physician: Merla Riches  Last Lipid Profile:Lipid Panel     Component Value Date/Time   CHOL 154 10/11/2012 1211   TRIG 56 10/11/2012 1211   HDL 68 10/11/2012 1211   CHOLHDL 2.3 10/11/2012 1211   VLDL 11 10/11/2012 1211   LDLCALC 75 10/11/2012 1211     Past Medical History  Diagnosis Date  . Cataract   . Depression   . Anxiety   . HTN (hypertension)   . Hyperlipemia     Past Surgical History  Procedure Laterality Date  . Cyst removal bilateral wrists      Current Outpatient Prescriptions  Medication Sig Dispense Refill  . aspirin 81 MG tablet Take 81 mg by mouth daily.      Marland Kitchen lisinopril (PRINIVIL,ZESTRIL) 10 MG tablet Take 1 tablet (10 mg total) by mouth daily.  90 tablet  3  . Multiple Vitamin (MULTIVITAMIN) tablet Take 1 tablet by mouth daily.      . simvastatin (ZOCOR) 20 MG tablet Take 1 tablet (20 mg total) by mouth daily.  90 tablet  3   No current facility-administered medications for this visit.    No Known Allergies  History   Social History  . Marital Status: Divorced    Spouse Name: N/A    Number of Children: 1  . Years of Education: N/A    Occupational History  . Retired [employer]    Social History Main Topics  . Smoking status: Former Smoker -- 0.50 packs/day for 40 years    Types: Cigarettes    Quit date: 07/09/2011  . Smokeless tobacco: Not on file  . Alcohol Use: No  . Drug Use: No  . Sexual Activity: No   Other Topics Concern  . Not on file   Social History Narrative  . No narrative on file    Family History  Problem Relation Age of Onset  . Heart attack Mother 31  . Stroke Mother   . Hypertension Mother   . Heart attack Father 81    Review of Systems:  As stated in the HPI and otherwise negative.   BP 138/90  Pulse 66  Ht 5\' 6"  (1.676 m)  Wt 230 lb 12.8 oz (104.69 kg)  BMI 37.27 kg/m2  Physical Examination: General: Well developed, well nourished, NAD HEENT: OP clear, mucus membranes moist SKIN: warm, dry. No rashes. Neuro: No focal deficits Musculoskeletal: Muscle strength 5/5 all ext Psychiatric: Mood and affect normal Neck: No JVD, no carotid bruits, no thyromegaly, no lymphadenopathy. Lungs:Clear bilaterally, no wheezes, rhonci, crackles Cardiovascular: Regular rate and rhythm. No murmurs, gallops or rubs. Abdomen:Soft. Bowel  sounds present. Non-tender.  Extremities: No lower extremity edema. Pulses are 2 + in the bilateral DP/PT.  Stress myoview 12/15/12: Stress Procedure: The patient exercised on the treadmill utilizing the Bruce Protocol for seven minutes. The patient stopped due to fatigue and denied any chest pain. Technetium 15m Sestamibi was injected at peak exercise and myocardial perfusion imaging was performed after a brief delay.  Stress ECG: No significant change from baseline ECG  QPS  Raw Data Images: Normal; no motion artifact; normal heart/lung ratio.  Stress Images: Normal homogeneous uptake in all areas of the myocardium.  Rest Images: Normal homogeneous uptake in all areas of the myocardium.  Subtraction (SDS): Normal  Transient Ischemic Dilatation (Normal <1.22): n/a   Lung/Heart Ratio (Normal <0.45): 0.38  Quantitative Gated Spect Images  QGS EDV: 75 ml  QGS ESV: 20 ml  Impression  Exercise Capacity: Fair exercise capacity.  BP Response: Hypertensive blood pressure response.  Clinical Symptoms: There is dyspnea.  ECG Impression: No significant ST segment change suggestive of ischemia.  Comparison with Prior Nuclear Study: No previous nuclear study performed  Overall Impression: Normal stress nuclear study.  LV Ejection Fraction: 74%. LV Wall Motion: NL LV Function; NL Wall Motion  Echo 12/11/12: Left ventricle: The cavity size was normal. Wall thickness was increased in a pattern of mild LVH. Systolic function was normal. The estimated ejection fraction was in the range of 55% to 60%. Doppler parameters are consistent with abnormal left ventricular relaxation (grade 1 diastolic dysfunction). - Left atrium: The atrium was mildly dilated.  Assessment and Plan:   1. Chest pain: Many risk factors for CAD including HTN, HLD, tobacco abuse, FH of CAD, obesity. Stress myoview without evidence of ischemia. Echo overall normal.    2. Dyspnea: Likely non-cardiac.

## 2013-04-18 ENCOUNTER — Ambulatory Visit (INDEPENDENT_AMBULATORY_CARE_PROVIDER_SITE_OTHER): Payer: Federal, State, Local not specified - PPO | Admitting: Internal Medicine

## 2013-04-18 ENCOUNTER — Other Ambulatory Visit: Payer: Self-pay

## 2013-04-18 ENCOUNTER — Encounter: Payer: Self-pay | Admitting: Internal Medicine

## 2013-04-18 VITALS — BP 162/79 | HR 58 | Temp 98.7°F | Resp 18 | Ht 65.5 in | Wt 232.0 lb

## 2013-04-18 DIAGNOSIS — Z23 Encounter for immunization: Secondary | ICD-10-CM

## 2013-04-18 DIAGNOSIS — M67431 Ganglion, right wrist: Secondary | ICD-10-CM

## 2013-04-18 DIAGNOSIS — E785 Hyperlipidemia, unspecified: Secondary | ICD-10-CM

## 2013-04-18 DIAGNOSIS — Z1231 Encounter for screening mammogram for malignant neoplasm of breast: Secondary | ICD-10-CM

## 2013-04-18 DIAGNOSIS — Z6832 Body mass index (BMI) 32.0-32.9, adult: Secondary | ICD-10-CM

## 2013-04-18 DIAGNOSIS — F329 Major depressive disorder, single episode, unspecified: Secondary | ICD-10-CM

## 2013-04-18 DIAGNOSIS — Z0000 Encounter for general adult medical examination without abnormal findings: Secondary | ICD-10-CM

## 2013-04-18 DIAGNOSIS — I10 Essential (primary) hypertension: Secondary | ICD-10-CM

## 2013-04-18 MED ORDER — LISINOPRIL 10 MG PO TABS: 10.0000 mg | ORAL_TABLET | Freq: Every day | ORAL | Status: DC

## 2013-04-18 MED ORDER — SIMVASTATIN 20 MG PO TABS: 20.0000 mg | ORAL_TABLET | Freq: Every day | ORAL | Status: DC

## 2013-04-18 NOTE — Progress Notes (Signed)
Subjective:    Patient ID: [name] [name], female    DOB: 29-Aug-1950, 62 y.o.   MRN: [id number]  HPI Here today for CPE Last colonoscopy- 05/13/2010 Mammo- scheduled for 04/18/13 Dentist- q 3 months Eye exam- annually Flu- 10/14  Had cardiology work up 8/14. Negative nuclear stress test and normal echo. Labs were done then and stable.  Stopped lexapro several years ago; restarted lexapro one month ago to help deal with stressors of demanding, sick cousin (whose cancer is now in remission) and son and his family leaving for [place]. Dealing with things better with less crying spells. Had one episode of feeling like it was difficult for her to get her words out when in a large, uncomfortable gathering. This lasted about 30 seconds and resolved spontaneously. She recalls a similar episode several years ago when under a lot of stress with her mother's illness. She denies any associated symptoms.  Walking 2-3 x week for an hour at a time. This helps her sleep better. Enjoying family, friends and church.  Nocturia 2-3 x . This is unchanged for her. She drinks soda and coffee with caffeine close to bed time.  Has episodic nasal congestion, especially in winter, with clear runny nose and nasal congestion. Occasional headache over right eye. Has been taking OTC decongestants.  Review of Systems  Constitutional: Negative.   HENT: Negative.   Eyes: Negative.   Respiratory: Negative.   Cardiovascular: Negative.   Gastrointestinal: Negative.   Genitourinary: Negative.   Musculoskeletal: Negative.   Skin: Negative.   Allergic/Immunologic: Negative.   Neurological: Negative.   Hematological: Negative.   Psychiatric/Behavioral: Negative.    No chest pain, no SOB, no constipation, no diarrhea.     Objective:   Physical Exam  Nursing note and vitals reviewed. Constitutional: She is oriented to person, place, and time. She appears well-developed and well-nourished.  HENT:  Head:  Normocephalic.  Right Ear: Tympanic membrane, external ear and ear canal normal.  Left Ear: Tympanic membrane, external ear and ear canal normal.  Nose: Nose normal.  Mouth/Throat: Oropharynx is clear and moist. No oropharyngeal exudate.  Eyes: Conjunctivae are normal. Pupils are equal, round, and reactive to light. Right eye exhibits no discharge. Left eye exhibits no discharge.  Neck: Normal range of motion. Neck supple. No thyromegaly present.  Cardiovascular: Normal rate, regular rhythm, normal heart sounds and intact distal pulses.  Exam reveals no gallop and no friction rub.   No murmur heard. Pulmonary/Chest: Effort normal and breath sounds normal. No respiratory distress. She has no wheezes. She has no rales.  Abdominal: Soft. Bowel sounds are normal. She exhibits no distension and no mass. There is no tenderness. There is no rebound and no guarding.  Genitourinary: Rectum normal, vagina normal and uterus normal. Pelvic exam was performed with patient prone. There is no rash, tenderness, lesion or injury on the right labia. There is no rash, tenderness, lesion or injury on the left labia. Cervix exhibits no motion tenderness, no discharge and no friability. Right adnexum displays no mass, no tenderness and no fullness. Left adnexum displays no mass, no tenderness and no fullness. No erythema, tenderness or bleeding around the vagina. No vaginal discharge found.  Musculoskeletal: Normal range of motion.  Lymphadenopathy:    She has no cervical adenopathy.  Neurological: She is alert and oriented to person, place, and time. She has normal reflexes.  Skin: Skin is warm and dry.  Right wrist with ganglion cyst, 1.5 cm, mobile, nontender.  Psychiatric:  She has a normal mood and affect. Her behavior is normal. Judgment and thought content normal.       Assessment & Plan:  Need for prophylactic vaccination against Streptococcus pneumoniae (pneumococcus) - Plan: Pneumococcal polysaccharide  vaccine 23-valent greater than or equal to 2yo subcutaneous/IM  Annual physical exam - Plan: Pap IG w/ reflex to HPV when ASC-U, CANCELED: Pap IG w/ reflex to HPV when ASC-U  Essential hypertension, benign- continue lisinopril, recheck BP in 6 months. Instructed to not use decongestants, can try OTC antihistamines and will consider steroid nasal spray if no improvement.  Other and unspecified hyperlipidemia- continue simvastatin and recheck lipids in 6 months.  BMI 32.0-32.9,adult  Ganglion cyst of wrist, right- patient does not currently wish to have removed. Last aspir didn't last long  Depression- doing much better on lexapro 10 mg.   Meds ordered this encounter  Medications  . escitalopram (LEXAPRO) 10 MG tablet    Sig: Take 10 mg by mouth daily.  Marland Kitchen lisinopril (PRINIVIL,ZESTRIL) 10 MG tablet    Sig: Take 1 tablet (10 mg total) by mouth daily.    Dispense:  90 tablet    Refill:  3  . simvastatin (ZOCOR) 20 MG tablet    Sig: Take 1 tablet (20 mg total) by mouth daily.    Dispense:  90 tablet    Refill:  3  . FLUVIRIN INJ injection    Sig:     -  Pvax  F/u 6 w/ labs/?dexa #1  I have completed the patient encounter in its entirety as documented by FNP Leone Payor, with editing by me where necessary. Savaya Hakes P. Merla Riches, M.D.

## 2013-04-18 NOTE — Patient Instructions (Signed)
Over the counter antihistamine for runny nose/congestion- xyrtec or claritin (store brand is fine)

## 2013-04-18 NOTE — Progress Notes (Deleted)
   Subjective:    Patient ID: [name] [name], female    DOB: Jul 14, 1950, 62 y.o.   MRN: [id number]  HPI    Review of Systems  HENT: Positive for postnasal drip and sinus pressure.        Objective:   Physical Exam        Assessment & Plan:

## 2013-04-19 LAB — PAP IG W/ RFLX HPV ASCU

## 2013-04-24 ENCOUNTER — Encounter: Payer: Self-pay | Admitting: Internal Medicine

## 2013-05-08 ENCOUNTER — Ambulatory Visit (INDEPENDENT_AMBULATORY_CARE_PROVIDER_SITE_OTHER): Payer: Federal, State, Local not specified - PPO | Admitting: Internal Medicine

## 2013-05-08 ENCOUNTER — Telehealth: Payer: Self-pay

## 2013-05-08 VITALS — BP 170/84 | HR 74 | Temp 99.0°F | Resp 16 | Ht 65.0 in | Wt 235.8 lb

## 2013-05-08 DIAGNOSIS — I10 Essential (primary) hypertension: Secondary | ICD-10-CM

## 2013-05-08 DIAGNOSIS — R4701 Aphasia: Secondary | ICD-10-CM
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
## 2013-05-08 NOTE — Telephone Encounter (Signed)
She also stated this has happened before. At least 2 times in 2014. She did discuss at last OV but she doesn't remember what Dr. Merla Riches told her.

## 2013-05-08 NOTE — Telephone Encounter (Signed)
Spoke with patient and she states when she was having lunch with friend she had an episode where she could not get words out or speak. Her friend said it lasted about a min. She is aware when this happens but cant get words out. She does have a HA to of eye. Reviewed with Benny Lennert PA-C and she was advised to go to ED, or call 911, to get evaluated and possibly a scan. She said she would go there or come here for eval and she will go for scan if needed.

## 2013-05-08 NOTE — Telephone Encounter (Signed)
Patient called and said she believes she just experienced a mini- stroke yesterday at a restaurant. Patient is very concerned. She says she was fully aware of her surroundings when it happened but had extreme difficulty speaking and getting her words out. She says this has happened several times before and discussed that with Dr. Merla Riches, but she says she does not remember entirely what she needed to do. She would like to speak to [place] or Eunice Blase- NP, to feel reassured. Patient also is concerned could it be the medication that she is on? Or something else. Please advise.   [phone]

## 2013-05-14 ENCOUNTER — Ambulatory Visit: Payer: Federal, State, Local not specified - PPO | Admitting: Neurology

## 2013-05-17 ENCOUNTER — Ambulatory Visit
Admission: RE | Admit: 2013-05-17 | Discharge: 2013-05-17 | Disposition: A | Discharge status: Home / Self Care | Payer: Federal, State, Local not specified - PPO | Source: Ambulatory Visit | Attending: Internal Medicine | Admitting: Internal Medicine

## 2013-05-17 DIAGNOSIS — R4701 Aphasia: Secondary | ICD-10-CM

## 2013-05-17 MED ORDER — GADOBENATE DIMEGLUMINE 529 MG/ML IV SOLN
19.0000 mL | Freq: Once | INTRAVENOUS | Status: AC | PRN
  Administered 2013-05-17: 19 mL via INTRAVENOUS

## 2013-05-22 ENCOUNTER — Encounter: Payer: Self-pay | Admitting: Neurology

## 2013-05-22 ENCOUNTER — Ambulatory Visit (INDEPENDENT_AMBULATORY_CARE_PROVIDER_SITE_OTHER): Payer: Federal, State, Local not specified - PPO | Admitting: Neurology

## 2013-05-22 VITALS — BP 142/77 | HR 66 | Ht 66.0 in | Wt 236.0 lb

## 2013-05-22 DIAGNOSIS — I10 Essential (primary) hypertension: Secondary | ICD-10-CM

## 2013-05-22 DIAGNOSIS — G459 Transient cerebral ischemic attack, unspecified: Secondary | ICD-10-CM

## 2013-05-22 NOTE — Patient Instructions (Signed)
Overall you are doing fairly well but I do want to suggest a few things today:   Remember to drink plenty of fluid, eat healthy meals and do not skip any meals. Try to eat protein with a every meal and eat a healthy snack such as fruit or nuts in between meals. Try to keep a regular sleep-wake schedule and try to exercise daily, particularly in the form of walking, 20-30 minutes a day, if you can.   As far as your medications are concerned, I would like to suggest the following: 1)Please increase your aspirin to 162mg  daily. You can try taking the enteric coated tablet to help relieve stomach discomfort  As far as diagnostic testing:  1)MRA of the head. We will call you to schedule this 2)Carotid Ultrasound. Please schedule this upon checking out.  Follow up as needed. Please call us with any interim questions, concerns, problems, updates or refill requests.   Please also call us for any test results so we can go over those with you on the phone.  My clinical assistant and will answer any of your questions and relay your messages to me and also relay most of my messages to you.   Our phone number is [phone]. We also have an after hours call service for urgent matters and there is a physician on-call for urgent questions. For any emergencies you know to call 911 or go to the nearest emergency room
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
## 2013-05-30 ENCOUNTER — Encounter (INDEPENDENT_AMBULATORY_CARE_PROVIDER_SITE_OTHER): Payer: Self-pay

## 2013-05-30 ENCOUNTER — Ambulatory Visit (INDEPENDENT_AMBULATORY_CARE_PROVIDER_SITE_OTHER): Payer: Federal, State, Local not specified - PPO

## 2013-05-30 DIAGNOSIS — G459 Transient cerebral ischemic attack, unspecified: Secondary | ICD-10-CM

## 2013-06-05 ENCOUNTER — Ambulatory Visit
Admission: RE | Admit: 2013-06-05 | Discharge: 2013-06-05 | Disposition: A | Discharge status: Home / Self Care | Payer: Federal, State, Local not specified - PPO | Source: Ambulatory Visit

## 2013-06-05 DIAGNOSIS — Z1231 Encounter for screening mammogram for malignant neoplasm of breast: Secondary | ICD-10-CM

## 2013-06-06 ENCOUNTER — Other Ambulatory Visit (INDEPENDENT_AMBULATORY_CARE_PROVIDER_SITE_OTHER): Payer: Federal, State, Local not specified - PPO

## 2013-06-06 ENCOUNTER — Encounter: Payer: Self-pay | Admitting: Internal Medicine

## 2013-06-06 DIAGNOSIS — G459 Transient cerebral ischemic attack, unspecified: Secondary | ICD-10-CM

## 2013-06-07 ENCOUNTER — Other Ambulatory Visit: Payer: Self-pay | Admitting: Neurology

## 2013-06-07 DIAGNOSIS — G459 Transient cerebral ischemic attack, unspecified: Secondary | ICD-10-CM

## 2013-06-08 ENCOUNTER — Telehealth: Payer: Self-pay | Admitting: Neurology

## 2013-06-08 NOTE — Progress Notes (Signed)
Called patient and gave her the results of her MRA.

## 2013-06-08 NOTE — Telephone Encounter (Signed)
Spoke with patient, informed patient of her MRA results, patient states that the carotid ultrasound is complete, can we send results to PCP Dr. Merla Riches at [facility] [phone]

## 2013-06-18 ENCOUNTER — Telehealth: Payer: Self-pay

## 2013-06-18 MED ORDER — LISINOPRIL 10 MG PO TABS: 20.0000 mg | ORAL_TABLET | Freq: Every day | ORAL | Status: DC

## 2013-06-18 NOTE — Telephone Encounter (Signed)
[facility] pharmacy has some questions about the patient doubling her Lisinopril RX. States that the insurance will not allow until another RX is written.   [phone]

## 2013-06-18 NOTE — Telephone Encounter (Signed)
Sent refill for Lisinopril 20mg  to pharmacy per last OV. Advised pt.

## 2013-06-20 MED ORDER — LISINOPRIL 20 MG PO TABS: 20.0000 mg | ORAL_TABLET | Freq: Every day | ORAL | Status: DC

## 2013-06-20 NOTE — Telephone Encounter (Signed)
Pt called back and has not picked up new Rx sent because she needs a Rx for 90 days and the one sent at 2 tablets daily would only cover 15 days. Again checked Dr Doolittle's note which instr'd pt to call for RFs at new dose of 20 mg daily. I will send in a Rx for the 20 mg tabs, 1 QD #90.

## 2013-06-20 NOTE — Telephone Encounter (Signed)
Notified pt of new Rx.  

## 2013-06-20 NOTE — Addendum Note (Signed)
Addended by: Sheppard Plumber A on: 06/20/2013 09:47 AM   Modules accepted: Orders

## 2013-06-22 ENCOUNTER — Telehealth: Payer: Self-pay

## 2013-06-22 NOTE — Telephone Encounter (Signed)
Needs a muscle relaxer that works.    Also, would like the results from Dr. Levada Schilling office.    [place] [facility]    [phone]

## 2013-06-22 NOTE — Telephone Encounter (Signed)
Tell her both the MRI and carotid ultrasounds were totally normal Dr. Levada Schilling office exam did not find any neurological deficit  When does she need muscle relaxer? For neck pain at bedtime?

## 2013-06-28 NOTE — Telephone Encounter (Signed)
It is for lower back spasms.  Cyclobenzaprine 5mg .  What is next step, she has not had an episode at all?  Should she wait until she see in June?

## 2013-06-29 MED ORDER — CYCLOBENZAPRINE HCL 10 MG PO TABS: 10.0000 mg | ORAL_TABLET | Freq: Three times a day (TID) | ORAL | Status: DC | PRN

## 2013-06-29 NOTE — Telephone Encounter (Signed)
Troubleshoot this--she's very anxiuos Can have copy of neuro note if desired i'll be happy to see her next weekifneeded Could double flex to 10 for now

## 2013-06-29 NOTE — Telephone Encounter (Signed)
Pt stated that she does not need to have a copy of neuro notes as long as Dr Merla Riches agrees that it is normal, and she is fine to wait until June as planned to see Dr Merla Riches. She agrees to try the Flexeril 10 mg but needs new Rx sent in. Done. Dr Merla Riches, [place].

## 2013-08-06 ENCOUNTER — Other Ambulatory Visit: Payer: Self-pay | Admitting: Internal Medicine

## 2013-10-17 ENCOUNTER — Encounter: Payer: Self-pay | Admitting: Internal Medicine

## 2013-10-17 ENCOUNTER — Ambulatory Visit (INDEPENDENT_AMBULATORY_CARE_PROVIDER_SITE_OTHER): Payer: Federal, State, Local not specified - PPO | Admitting: Internal Medicine

## 2013-10-17 VITALS — BP 162/90 | HR 66 | Temp 98.9°F | Resp 16 | Ht 66.5 in | Wt 238.0 lb

## 2013-10-17 DIAGNOSIS — F329 Major depressive disorder, single episode, unspecified: Secondary | ICD-10-CM

## 2013-10-17 DIAGNOSIS — Z6832 Body mass index (BMI) 32.0-32.9, adult: Secondary | ICD-10-CM

## 2013-10-17 DIAGNOSIS — F341 Dysthymic disorder: Secondary | ICD-10-CM

## 2013-10-17 DIAGNOSIS — M6740 Ganglion, unspecified site: Secondary | ICD-10-CM

## 2013-10-17 DIAGNOSIS — I10 Essential (primary) hypertension: Secondary | ICD-10-CM

## 2013-10-17 DIAGNOSIS — E785 Hyperlipidemia, unspecified: Secondary | ICD-10-CM

## 2013-10-17 LAB — POCT URINALYSIS DIPSTICK
Bilirubin, UA: NEGATIVE
Glucose, UA: NEGATIVE
Ketones, UA: NEGATIVE
Leukocytes, UA: NEGATIVE
NITRITE UA: NEGATIVE
PH UA: 7
Protein, UA: NEGATIVE
RBC UA: NEGATIVE
Spec Grav, UA: 1.015
Urobilinogen, UA: 0.2

## 2013-10-17 LAB — CBC WITH DIFFERENTIAL/PLATELET
BASOS PCT: 0 % (ref 0–1)
Basophils Absolute: 0 10*3/uL (ref 0.0–0.1)
Eosinophils Absolute: 0.1 10*3/uL (ref 0.0–0.7)
Eosinophils Relative: 3 % (ref 0–5)
HCT: 38.2 % (ref 36.0–46.0)
Hemoglobin: 12.3 g/dL (ref 12.0–15.0)
Lymphocytes Relative: 41 % (ref 12–46)
Lymphs Abs: 1.2 10*3/uL (ref 0.7–4.0)
MCH: 25.6 pg — ABNORMAL LOW (ref 26.0–34.0)
MCHC: 32.2 g/dL (ref 30.0–36.0)
MCV: 79.6 fL (ref 78.0–100.0)
Monocytes Absolute: 0.3 10*3/uL (ref 0.1–1.0)
Monocytes Relative: 9 % (ref 3–12)
NEUTROS PCT: 47 % (ref 43–77)
Neutro Abs: 1.4 10*3/uL — ABNORMAL LOW (ref 1.7–7.7)
PLATELETS: 263 10*3/uL (ref 150–400)
RBC: 4.8 MIL/uL (ref 3.87–5.11)
RDW: 13.7 % (ref 11.5–15.5)
WBC: 3 10*3/uL — ABNORMAL LOW (ref 4.0–10.5)

## 2013-10-17 MED ORDER — LISINOPRIL 20 MG PO TABS: 20.0000 mg | ORAL_TABLET | Freq: Every day | ORAL | Status: DC

## 2013-10-17 MED ORDER — ALPRAZOLAM 0.25 MG PO TABS: ORAL_TABLET | ORAL | Status: DC

## 2013-10-17 MED ORDER — ESCITALOPRAM OXALATE 10 MG PO TABS: 10.0000 mg | ORAL_TABLET | Freq: Every day | ORAL | Status: DC

## 2013-10-17 NOTE — Progress Notes (Signed)
This chart was scribed for Leandrew Koyanagi, MD by Einar Pheasant, ED Scribe. This patient was seen in room 24 and the patient's care was started at 11:40 AM.  Subjective:    Patient ID: [name] [name], female    DOB: 09-21-1950, 63 y.o.   MRN: [id number]  Chief Complaint  Patient presents with   Follow-up    cholesterol and medication refill - LISINOPRIL    HPI HPI Comments: [name] [name] is a 63 y.o. female who presents to the [facility] for her 6 month follow up for her cholesterol. Pt is also requesting a medication refill for her Lisinopril.   Today, pt states that now she only has coffee in the morning. She states that she has decreased her caffeine intake by not drinking as much [employer] soda. Pt states that her Asprin dosage was increased by another physician which caused her to develop an ulcer. When she went back to him he reduced the dosage.   Pt is complaining of her hypertension. She states that her  BP has not gone down, because her diastolic number is still hovering around 90. Pt states that she has gained weight because she has not been eating well lately. She also states that she's increased her salt intake so she is concerned that the increased salt intake is causing her hypertension.   Pt states that next weeks she is traveling to [place]. She states that flying makes her very nervous. Pt would like some medication to deal with the anxiety of of flying. She is also complaining of lower back spasm, which she was prescribed flexeril for.  [name] [name] states that her depression has been really well controlled with her prescribed medication. She states that she hasn't cried in a while.  She is also complaining of a ganglionic cyst to her right wrist. Getting larger over several weeks and now very uncomfortable. This has been aspirated in the past with success. She had aspiration of the left wrist ganglionic cyst in the past and it  resolved.  Patient Active Problem List   Diagnosis Date Noted   Depression, reactive 10/12/2012   Chronic neutropenia 04/14/2012   Hyperlipemia 10/20/2011   Anemia 10/20/2011   HTN (hypertension) 10/20/2011   BMI 32.0-32.9,adult 10/20/2011   Past Medical History  Diagnosis Date   Cataract    Depression    Anxiety    HTN (hypertension)    Hyperlipemia    Past Surgical History  Procedure Laterality Date   Cyst removal bilateral wrists     No Known Allergies Prior to Admission medications   Medication Sig Start Date End Date Taking? Authorizing Provider  aspirin 81 MG tablet Take 81 mg by mouth daily.   Yes Historical Provider, MD  cyclobenzaprine (FLEXERIL) 10 MG tablet Take 1 tablet (10 mg total) by mouth 3 (three) times daily as needed for muscle spasms. 06/29/13  Yes Leandrew Koyanagi, MD  escitalopram (LEXAPRO) 10 MG tablet Take 10 mg by mouth daily.   Yes Historical Provider, MD  lisinopril (PRINIVIL,ZESTRIL) 20 MG tablet Take 1 tablet (20 mg total) by mouth daily. 06/20/13  Yes Leandrew Koyanagi, MD  Multiple Vitamin (MULTIVITAMIN) tablet Take 1 tablet by mouth daily.   Yes Historical Provider, MD  simvastatin (ZOCOR) 20 MG tablet Take 1 tablet (20 mg total) by mouth daily. 04/18/13  Yes Leandrew Koyanagi, MD  escitalopram (LEXAPRO) 10 MG tablet TAKE 1 TABLET BY MOUTH EVERY DAY  Leandrew Koyanagi, MD  FLUVIRIN INJ injection  02/20/13   Historical Provider, MD   History   Social History   Marital Status: Divorced    Spouse Name: N/A    Number of Children: 1   Years of Education: college   Occupational History   Retired [employer]    Social History Main Topics   Smoking status: Former Smoker -- 0.50 packs/day for 40 years    Types: Cigarettes    Quit date: 07/09/2011   Smokeless tobacco: Not on file   Alcohol Use: No   Drug Use: No   Sexual Activity: No   Other Topics Concern   Not on file   Social History Narrative   Patient is single  lives at home alone, has 1 child   Patient is right handed   Education level is 1 year of college   Caffeine consumption is 3-4 cups daily     Review of Systems  Constitutional: Negative for appetite change and fatigue.  HENT: Negative for congestion, ear discharge and sinus pressure.   Eyes: Negative for discharge.  Respiratory: Negative for cough.   Cardiovascular: Negative for chest pain.  Gastrointestinal: Negative for abdominal pain and diarrhea.  Genitourinary: Negative for frequency and hematuria.  Musculoskeletal: Negative for back pain.  Skin: Negative for rash.  Neurological: Negative for seizures and headaches.  Psychiatric/Behavioral: Negative for hallucinations.       Objective:   Physical Exam  Nursing note and vitals reviewed. Constitutional: She appears well-developed and well-nourished. No distress.  HENT:  Head: Normocephalic and atraumatic.  Right Ear: External ear normal.  Left Ear: External ear normal.  Mouth/Throat: Oropharynx is clear and moist.  Eyes: Conjunctivae and EOM are normal. Pupils are equal, round, and reactive to light. Right eye exhibits no discharge. Left eye exhibits no discharge.  Neck: Neck supple. No thyromegaly present.  Cardiovascular: Normal rate, regular rhythm and normal heart sounds.  Exam reveals no gallop and no friction rub.   No murmur heard. Pulmonary/Chest: Effort normal and breath sounds normal. No respiratory distress.  Abdominal: Soft. She exhibits no distension. There is no tenderness.  Musculoskeletal: She exhibits no edema and no tenderness.  On the right wrist volar aspect there is a 1.5 cm fluctuant mass that is tender. Not red. Not pulsatile  Lymphadenopathy:    She has no cervical adenopathy.  Neurological: She is alert.  Skin: Skin is warm and dry.  Psychiatric: She has a normal mood and affect. Her behavior is normal. Thought content normal.    Filed Vitals:   10/17/13 1046  BP: 162/90  Pulse: 66  Temp:  98.9 F (37.2 C)  TempSrc: Oral  Resp: 16  Height: 5' 6.5" (1.689 m)  Weight: 238 lb (107.956 kg)  SpO2: 98%      Procedure= 1 cc 2% epinephrine for anesthesia allowed aspiration of the ganglionic cyst with an 18-gauge needle and several ccs  Of cleared gelatinous material is aspirated and then expressed with almost complete resolution of the cyst and with  resolution of the tenderness   Assessment & Plan:  Unspecified essential hypertension - Plan: POCT urinalysis dipstick, CBC with Differential, COMPLETE METABOLIC PANEL WITH GFR, TSH  Other and unspecified hyperlipidemia - Plan: CBC with Differential, Lipid panel, TSH  BMI 32.0-32.9,adult  Depression, reactive  Ganglion cyst  Results for orders placed in visit on 10/17/13  CBC WITH DIFFERENTIAL      Result Value Ref Range   WBC 3.0 (*) 4.0 -  10.5 K/uL   RBC 4.80  3.87 - 5.11 MIL/uL   Hemoglobin 12.3  12.0 - 15.0 g/dL   HCT 38.2  36.0 - 46.0 %   MCV 79.6  78.0 - 100.0 fL   MCH 25.6 (*) 26.0 - 34.0 pg   MCHC 32.2  30.0 - 36.0 g/dL   RDW 13.7  11.5 - 15.5 %   Platelets 263  150 - 400 K/uL   Neutrophils Relative % 47  43 - 77 %   Neutro Abs 1.4 (*) 1.7 - 7.7 K/uL   Lymphocytes Relative 41  12 - 46 %   Lymphs Abs 1.2  0.7 - 4.0 K/uL   Monocytes Relative 9  3 - 12 %   Monocytes Absolute 0.3  0.1 - 1.0 K/uL   Eosinophils Relative 3  0 - 5 %   Eosinophils Absolute 0.1  0.0 - 0.7 K/uL   Basophils Relative 0  0 - 1 %   Basophils Absolute 0.0  0.0 - 0.1 K/uL   Smear Review Criteria for review not met    COMPLETE METABOLIC PANEL WITH GFR      Result Value Ref Range   Sodium 140  135 - 145 mEq/L   Potassium 4.3  3.5 - 5.3 mEq/L   Chloride 104  96 - 112 mEq/L   CO2 26  19 - 32 mEq/L   Glucose, Bld 84  70 - 99 mg/dL   BUN 17  6 - 23 mg/dL   Creat 0.80  0.50 - 1.10 mg/dL   Total Bilirubin 0.3  0.2 - 1.2 mg/dL   Alkaline Phosphatase 81  39 - 117 U/L   AST 19  0 - 37 U/L   ALT 16  0 - 35 U/L   Total Protein 6.9  6.0 - 8.3  g/dL   Albumin 3.9  3.5 - 5.2 g/dL   Calcium 9.0  8.4 - 10.5 mg/dL   GFR, Est African American >89     GFR, Est Non African American 79    LIPID PANEL      Result Value Ref Range   Cholesterol 163  0 - 200 mg/dL   Triglycerides 62  <150 mg/dL   HDL 66  >39 mg/dL   Total CHOL/HDL Ratio 2.5     VLDL 12  0 - 40 mg/dL   LDL Cholesterol 85  0 - 99 mg/dL  TSH      Result Value Ref Range   TSH 1.390  0.350 - 4.500 uIU/mL  POCT URINALYSIS DIPSTICK      Result Value Ref Range   Color, UA yellow     Clarity, UA clear     Glucose, UA neg     Bilirubin, UA neg     Ketones, UA neg     Spec Grav, UA 1.015     Blood, UA neg     pH, UA 7.0     Protein, UA neg     Urobilinogen, UA 0.2     Nitrite, UA neg     Leukocytes, UA Negative     Continue simvastatin 20 mg, lisinopril 20 mg and follow home blood pressures closely-call if remain high after anxiety resolved, Lexapro 10 mg, and Flexeril 10 mg when necessary. Use Xanax 0.25-0.5 mg for flying Followup six-month     I personally performed the services described in this documentation, which was scribed in my presence. The recorded information has been reviewed and is accurate.

## 2013-10-18 ENCOUNTER — Encounter: Payer: Self-pay | Admitting: Internal Medicine

## 2013-10-18 LAB — COMPLETE METABOLIC PANEL WITH GFR
ALK PHOS: 81 U/L (ref 39–117)
ALT: 16 U/L (ref 0–35)
AST: 19 U/L (ref 0–37)
Albumin: 3.9 g/dL (ref 3.5–5.2)
BILIRUBIN TOTAL: 0.3 mg/dL (ref 0.2–1.2)
BUN: 17 mg/dL (ref 6–23)
CO2: 26 mEq/L (ref 19–32)
Calcium: 9 mg/dL (ref 8.4–10.5)
Chloride: 104 mEq/L (ref 96–112)
Creat: 0.8 mg/dL (ref 0.50–1.10)
GFR, EST NON AFRICAN AMERICAN: 79 mL/min
GFR, Est African American: >89 mL/min
Glucose, Bld: 84 mg/dL (ref 70–99)
Potassium: 4.3 mEq/L (ref 3.5–5.3)
SODIUM: 140 meq/L (ref 135–145)
TOTAL PROTEIN: 6.9 g/dL (ref 6.0–8.3)

## 2013-10-18 LAB — LIPID PANEL
CHOL/HDL RATIO: 2.5 ratio
Cholesterol: 163 mg/dL (ref 0–200)
HDL: 66 mg/dL (ref >39)
LDL Cholesterol: 85 mg/dL (ref 0–99)
Triglycerides: 62 mg/dL (ref <150)
VLDL: 12 mg/dL (ref 0–40)

## 2013-10-18 LAB — TSH: TSH: 1.39 u[IU]/mL (ref 0.350–4.500)

## 2014-01-18 ENCOUNTER — Inpatient Hospital Stay (HOSPITAL_COMMUNITY)
Admission: EM | Admit: 2014-01-18 | Discharge: 2014-01-29 | DRG: 871 | Disposition: A | Discharge status: Rehabilitation Facility | Payer: Federal, State, Local not specified - PPO | Attending: Family Medicine | Admitting: Family Medicine

## 2014-01-18 ENCOUNTER — Emergency Department (HOSPITAL_COMMUNITY): Payer: Federal, State, Local not specified - PPO

## 2014-01-18 ENCOUNTER — Encounter (HOSPITAL_COMMUNITY): Payer: Self-pay | Admitting: Emergency Medicine

## 2014-01-18 DIAGNOSIS — Z7982 Long term (current) use of aspirin: Secondary | ICD-10-CM | POA: Diagnosis not present

## 2014-01-18 DIAGNOSIS — R6520 Severe sepsis without septic shock: Secondary | ICD-10-CM | POA: Diagnosis present

## 2014-01-18 DIAGNOSIS — F329 Major depressive disorder, single episode, unspecified: Secondary | ICD-10-CM | POA: Diagnosis present

## 2014-01-18 DIAGNOSIS — I10 Essential (primary) hypertension: Secondary | ICD-10-CM

## 2014-01-18 DIAGNOSIS — E873 Alkalosis: Secondary | ICD-10-CM | POA: Diagnosis present

## 2014-01-18 DIAGNOSIS — IMO0002 Reserved for concepts with insufficient information to code with codable children: Secondary | ICD-10-CM | POA: Diagnosis present

## 2014-01-18 DIAGNOSIS — R4182 Altered mental status, unspecified: Secondary | ICD-10-CM | POA: Diagnosis present

## 2014-01-18 DIAGNOSIS — B370 Candidal stomatitis: Secondary | ICD-10-CM | POA: Diagnosis not present

## 2014-01-18 DIAGNOSIS — F3289 Other specified depressive episodes: Secondary | ICD-10-CM | POA: Diagnosis present

## 2014-01-18 DIAGNOSIS — Z79899 Other long term (current) drug therapy: Secondary | ICD-10-CM

## 2014-01-18 DIAGNOSIS — G002 Streptococcal meningitis: Secondary | ICD-10-CM | POA: Diagnosis present

## 2014-01-18 DIAGNOSIS — R778 Other specified abnormalities of plasma proteins: Secondary | ICD-10-CM

## 2014-01-18 DIAGNOSIS — J9819 Other pulmonary collapse: Secondary | ICD-10-CM | POA: Diagnosis present

## 2014-01-18 DIAGNOSIS — Z8249 Family history of ischemic heart disease and other diseases of the circulatory system: Secondary | ICD-10-CM | POA: Diagnosis not present

## 2014-01-18 DIAGNOSIS — I219 Acute myocardial infarction, unspecified: Secondary | ICD-10-CM | POA: Diagnosis not present

## 2014-01-18 DIAGNOSIS — B002 Herpesviral gingivostomatitis and pharyngotonsillitis: Secondary | ICD-10-CM | POA: Diagnosis present

## 2014-01-18 DIAGNOSIS — A419 Sepsis, unspecified organism: Secondary | ICD-10-CM | POA: Diagnosis not present

## 2014-01-18 DIAGNOSIS — R509 Fever, unspecified: Secondary | ICD-10-CM | POA: Diagnosis not present

## 2014-01-18 DIAGNOSIS — H532 Diplopia: Secondary | ICD-10-CM | POA: Diagnosis present

## 2014-01-18 DIAGNOSIS — I248 Other forms of acute ischemic heart disease: Secondary | ICD-10-CM | POA: Diagnosis present

## 2014-01-18 DIAGNOSIS — A491 Streptococcal infection, unspecified site: Secondary | ICD-10-CM | POA: Diagnosis not present

## 2014-01-18 DIAGNOSIS — A403 Sepsis due to Streptococcus pneumoniae: Secondary | ICD-10-CM | POA: Diagnosis present

## 2014-01-18 DIAGNOSIS — R400 Somnolence: Secondary | ICD-10-CM

## 2014-01-18 DIAGNOSIS — H9190 Unspecified hearing loss, unspecified ear: Secondary | ICD-10-CM | POA: Diagnosis present

## 2014-01-18 DIAGNOSIS — F341 Dysthymic disorder: Secondary | ICD-10-CM | POA: Diagnosis not present

## 2014-01-18 DIAGNOSIS — F411 Generalized anxiety disorder: Secondary | ICD-10-CM | POA: Diagnosis present

## 2014-01-18 DIAGNOSIS — R5381 Other malaise: Secondary | ICD-10-CM | POA: Diagnosis present

## 2014-01-18 DIAGNOSIS — R0609 Other forms of dyspnea: Secondary | ICD-10-CM | POA: Diagnosis present

## 2014-01-18 DIAGNOSIS — R410 Disorientation, unspecified: Secondary | ICD-10-CM

## 2014-01-18 DIAGNOSIS — D649 Anemia, unspecified: Secondary | ICD-10-CM | POA: Diagnosis not present

## 2014-01-18 DIAGNOSIS — R51 Headache: Secondary | ICD-10-CM | POA: Diagnosis not present

## 2014-01-18 DIAGNOSIS — Z87891 Personal history of nicotine dependence: Secondary | ICD-10-CM | POA: Diagnosis not present

## 2014-01-18 DIAGNOSIS — R0989 Other specified symptoms and signs involving the circulatory and respiratory systems: Secondary | ICD-10-CM | POA: Diagnosis present

## 2014-01-18 DIAGNOSIS — G009 Bacterial meningitis, unspecified: Secondary | ICD-10-CM

## 2014-01-18 DIAGNOSIS — G9341 Metabolic encephalopathy: Secondary | ICD-10-CM | POA: Diagnosis present

## 2014-01-18 DIAGNOSIS — E785 Hyperlipidemia, unspecified: Secondary | ICD-10-CM | POA: Diagnosis present

## 2014-01-18 DIAGNOSIS — R7989 Other specified abnormal findings of blood chemistry: Secondary | ICD-10-CM

## 2014-01-18 DIAGNOSIS — B955 Unspecified streptococcus as the cause of diseases classified elsewhere: Secondary | ICD-10-CM

## 2014-01-18 DIAGNOSIS — Z6832 Body mass index (BMI) 32.0-32.9, adult: Secondary | ICD-10-CM

## 2014-01-18 DIAGNOSIS — R7881 Bacteremia: Secondary | ICD-10-CM

## 2014-01-18 DIAGNOSIS — B953 Streptococcus pneumoniae as the cause of diseases classified elsewhere: Secondary | ICD-10-CM | POA: Diagnosis not present

## 2014-01-18 DIAGNOSIS — G039 Meningitis, unspecified: Secondary | ICD-10-CM | POA: Diagnosis not present

## 2014-01-18 DIAGNOSIS — I2489 Other forms of acute ischemic heart disease: Secondary | ICD-10-CM | POA: Diagnosis present

## 2014-01-18 HISTORY — DX: Bacterial meningitis, unspecified: G00.9

## 2014-01-18 LAB — CBC WITH DIFFERENTIAL/PLATELET
BASOS ABS: 0 10*3/uL (ref 0.0–0.1)
Basophils Relative: 0 % (ref 0–1)
EOS ABS: 0 10*3/uL (ref 0.0–0.7)
EOS PCT: 0 % (ref 0–5)
HEMATOCRIT: 43.2 % (ref 36.0–46.0)
Hemoglobin: 13.6 g/dL (ref 12.0–15.0)
Lymphocytes Relative: 2 % — ABNORMAL LOW (ref 12–46)
Lymphs Abs: 0.3 10*3/uL — ABNORMAL LOW (ref 0.7–4.0)
MCH: 25.9 pg — AB (ref 26.0–34.0)
MCHC: 31.5 g/dL (ref 30.0–36.0)
MCV: 82.1 fL (ref 78.0–100.0)
Monocytes Absolute: 0.8 10*3/uL (ref 0.1–1.0)
Monocytes Relative: 6 % (ref 3–12)
Neutro Abs: 12.8 10*3/uL — ABNORMAL HIGH (ref 1.7–7.7)
Neutrophils Relative %: 92 % — ABNORMAL HIGH (ref 43–77)
Platelets: 232 10*3/uL (ref 150–400)
RBC: 5.26 MIL/uL — ABNORMAL HIGH (ref 3.87–5.11)
RDW: 12.7 % (ref 11.5–15.5)
WBC: 13.9 10*3/uL — ABNORMAL HIGH (ref 4.0–10.5)

## 2014-01-18 LAB — MRSA PCR SCREENING: MRSA by PCR: NEGATIVE

## 2014-01-18 LAB — RAPID URINE DRUG SCREEN, HOSP PERFORMED
AMPHETAMINES: NOT DETECTED
BENZODIAZEPINES: NOT DETECTED
Barbiturates: NOT DETECTED
COCAINE: NOT DETECTED
OPIATES: NOT DETECTED
TETRAHYDROCANNABINOL: NOT DETECTED

## 2014-01-18 LAB — CSF CELL COUNT WITH DIFFERENTIAL
EOS CSF: 0 % (ref 0–1)
EOS CSF: 0 % (ref 0–1)
LYMPHS CSF: 1 % — AB (ref 40–80)
LYMPHS CSF: 1 % — AB (ref 40–80)
Monocyte-Macrophage-Spinal Fluid: 7 % — ABNORMAL LOW (ref 15–45)
Monocyte-Macrophage-Spinal Fluid: 9 % — ABNORMAL LOW (ref 15–45)
OTHER CELLS CSF: 0
OTHER CELLS CSF: 0
RBC Count, CSF: 585 /mm3 — ABNORMAL HIGH
RBC Count, CSF: 695 /mm3 — ABNORMAL HIGH
SEGMENTED NEUTROPHILS-CSF: 90 % — AB (ref 0–6)
Segmented Neutrophils-CSF: 92 % — ABNORMAL HIGH (ref 0–6)
TUBE #: 1
Tube #: 4
WBC, CSF: 11321 /mm3 (ref 0–5)
WBC, CSF: 7810 /mm3 (ref 0–5)

## 2014-01-18 LAB — AMMONIA: Ammonia: 47 umol/L (ref 11–60)

## 2014-01-18 LAB — I-STAT ARTERIAL BLOOD GAS, ED
ACID-BASE DEFICIT: 2 mmol/L (ref 0.0–2.0)
Bicarbonate: 20.6 mEq/L (ref 20.0–24.0)
O2 Saturation: 95 %
PO2 ART: 69 mmHg — AB (ref 80.0–100.0)
TCO2: 21 mmol/L (ref 0–100)
pCO2 arterial: 28.1 mmHg — ABNORMAL LOW (ref 35.0–45.0)
pH, Arterial: 7.474 — ABNORMAL HIGH (ref 7.350–7.450)

## 2014-01-18 LAB — COMPREHENSIVE METABOLIC PANEL
ALBUMIN: 3.9 g/dL (ref 3.5–5.2)
ALT: 25 U/L (ref 0–35)
AST: 33 U/L (ref 0–37)
Alkaline Phosphatase: 105 U/L (ref 39–117)
Anion gap: 18 — ABNORMAL HIGH (ref 5–15)
BUN: 16 mg/dL (ref 6–23)
CALCIUM: 9.6 mg/dL (ref 8.4–10.5)
CO2: 25 mEq/L (ref 19–32)
Chloride: 95 mEq/L — ABNORMAL LOW (ref 96–112)
Creatinine, Ser: 0.94 mg/dL (ref 0.50–1.10)
GFR calc Af Amer: 74 mL/min — ABNORMAL LOW (ref >90)
GFR calc non Af Amer: 64 mL/min — ABNORMAL LOW (ref >90)
Glucose, Bld: 185 mg/dL — ABNORMAL HIGH (ref 70–99)
Potassium: 3.3 mEq/L — ABNORMAL LOW (ref 3.7–5.3)
Sodium: 138 mEq/L (ref 137–147)
Total Bilirubin: 0.7 mg/dL (ref 0.3–1.2)
Total Protein: 8.6 g/dL — ABNORMAL HIGH (ref 6.0–8.3)

## 2014-01-18 LAB — INFLUENZA PANEL BY PCR (TYPE A & B)
H1N1 flu by pcr: NOT DETECTED
Influenza A By PCR: NEGATIVE
Influenza B By PCR: NEGATIVE

## 2014-01-18 LAB — PROTEIN, CSF

## 2014-01-18 LAB — URINALYSIS, ROUTINE W REFLEX MICROSCOPIC
Bilirubin Urine: NEGATIVE
Glucose, UA: NEGATIVE mg/dL
KETONES UR: NEGATIVE mg/dL
LEUKOCYTES UA: NEGATIVE
NITRITE: NEGATIVE
PH: 6.5 (ref 5.0–8.0)
Protein, ur: 100 mg/dL — AB
SPECIFIC GRAVITY, URINE: 1.016 (ref 1.005–1.030)
Urobilinogen, UA: 0.2 mg/dL (ref 0.0–1.0)

## 2014-01-18 LAB — APTT: APTT: 37 s (ref 24–37)

## 2014-01-18 LAB — GRAM STAIN

## 2014-01-18 LAB — URINE MICROSCOPIC-ADD ON

## 2014-01-18 LAB — ETHANOL: Alcohol, Ethyl (B): <11 mg/dL (ref 0–11)

## 2014-01-18 LAB — TSH: TSH: 2.21 u[IU]/mL (ref 0.350–4.500)

## 2014-01-18 LAB — I-STAT CG4 LACTIC ACID, ED
LACTIC ACID, VENOUS: 3.59 mmol/L — AB (ref 0.5–2.2)
Lactic Acid, Venous: 5.69 mmol/L — ABNORMAL HIGH (ref 0.5–2.2)

## 2014-01-18 LAB — SALICYLATE LEVEL: Salicylate Lvl: <2 mg/dL — ABNORMAL LOW (ref 2.8–20.0)

## 2014-01-18 LAB — GLUCOSE, CSF

## 2014-01-18 LAB — ACETAMINOPHEN LEVEL

## 2014-01-18 LAB — PROTIME-INR
INR: 1.21 (ref 0.00–1.49)
Prothrombin Time: 15.3 seconds — ABNORMAL HIGH (ref 11.6–15.2)

## 2014-01-18 LAB — I-STAT TROPONIN, ED: Troponin i, poc: 0.63 ng/mL (ref 0.00–0.08)

## 2014-01-18 MED ORDER — FENTANYL CITRATE 0.05 MG/ML IJ SOLN
50.0000 ug | Freq: Once | INTRAMUSCULAR | Status: AC
  Administered 2014-01-18: 50 ug via INTRAVENOUS
  Filled 2014-01-18: qty 2

## 2014-01-18 MED ORDER — DEXTROSE 5 % IV SOLN
10.0000 mg/kg | Freq: Three times a day (TID) | INTRAVENOUS | Status: DC
  Administered 2014-01-18 – 2014-01-19 (×2): 605 mg via INTRAVENOUS
  Filled 2014-01-18 (×5): qty 12.1

## 2014-01-18 MED ORDER — PIPERACILLIN-TAZOBACTAM 3.375 G IVPB 30 MIN
3.3750 g | Freq: Once | INTRAVENOUS | Status: AC
  Administered 2014-01-18: 3.375 g via INTRAVENOUS
  Filled 2014-01-18: qty 50

## 2014-01-18 MED ORDER — SODIUM CHLORIDE 0.9 % IV BOLUS (SEPSIS)
30.0000 mL/kg | Freq: Once | INTRAVENOUS | Status: AC
  Administered 2014-01-18: 3240 mL via INTRAVENOUS

## 2014-01-18 MED ORDER — SODIUM CHLORIDE 0.9 % IV BOLUS (SEPSIS)
1000.0000 mL | Freq: Once | INTRAVENOUS | Status: AC
  Administered 2014-01-18: 1000 mL via INTRAVENOUS

## 2014-01-18 MED ORDER — HEPARIN SODIUM (PORCINE) 5000 UNIT/ML IJ SOLN
5000.0000 [IU] | Freq: Three times a day (TID) | INTRAMUSCULAR | Status: DC
  Administered 2014-01-18 – 2014-01-29 (×33): 5000 [IU] via SUBCUTANEOUS
  Filled 2014-01-18 (×35): qty 1

## 2014-01-18 MED ORDER — LORAZEPAM 2 MG/ML IJ SOLN
1.0000 mg | Freq: Once | INTRAMUSCULAR | Status: AC
  Administered 2014-01-18: 1 mg via INTRAVENOUS
  Filled 2014-01-18: qty 1

## 2014-01-18 MED ORDER — VANCOMYCIN HCL IN DEXTROSE 1-5 GM/200ML-% IV SOLN
1000.0000 mg | Freq: Two times a day (BID) | INTRAVENOUS | Status: DC
  Administered 2014-01-19 – 2014-01-20 (×3): 1000 mg via INTRAVENOUS
  Filled 2014-01-18 (×6): qty 200

## 2014-01-18 MED ORDER — ACETAMINOPHEN 650 MG RE SUPP
650.0000 mg | Freq: Once | RECTAL | Status: AC
  Administered 2014-01-18: 650 mg via RECTAL
  Filled 2014-01-18: qty 1

## 2014-01-18 MED ORDER — ACETAMINOPHEN 325 MG PO TABS
650.0000 mg | ORAL_TABLET | Freq: Four times a day (QID) | ORAL | Status: DC | PRN
  Administered 2014-01-24 – 2014-01-26 (×5): 650 mg via ORAL
  Filled 2014-01-18 (×5): qty 2

## 2014-01-18 MED ORDER — PIPERACILLIN-TAZOBACTAM 3.375 G IVPB
3.3750 g | Freq: Three times a day (TID) | INTRAVENOUS | Status: DC
Start: 2014-01-19 — End: 2014-01-19
  Administered 2014-01-19: 3.375 g via INTRAVENOUS
  Filled 2014-01-18 (×3): qty 50

## 2014-01-18 MED ORDER — SODIUM CHLORIDE 0.9 % IV SOLN
1000.0000 mL | INTRAVENOUS | Status: DC
  Administered 2014-01-18: 1000 mL via INTRAVENOUS

## 2014-01-18 MED ORDER — ACETAMINOPHEN 650 MG RE SUPP
650.0000 mg | Freq: Four times a day (QID) | RECTAL | Status: DC | PRN
  Filled 2014-01-18: qty 1

## 2014-01-18 MED ORDER — VANCOMYCIN HCL IN DEXTROSE 1-5 GM/200ML-% IV SOLN
1000.0000 mg | Freq: Once | INTRAVENOUS | Status: AC
  Administered 2014-01-18: 1000 mg via INTRAVENOUS
  Filled 2014-01-18: qty 200

## 2014-01-18 NOTE — ED Notes (Signed)
Called floor lumbar puncture completed and transporting patient.

## 2014-01-18 NOTE — ED Notes (Signed)
Lab results reported to Dr.Docherty. 

## 2014-01-18 NOTE — ED Provider Notes (Signed)
CSN: [id number]     Arrival date & time 01/18/14  1557 History   First MD Initiated Contact with Patient 01/18/14 [phone]     Chief Complaint  Patient presents with  . Altered Mental Status     (Consider location/radiation/quality/duration/timing/severity/associated sxs/prior Treatment) HPI Comments: Patient is a 63 year old female with a past medical history depression, anxiety, hypertension and hyperlipidemia presents to the emergency department via EMS from home with altered mental status. Patient's sister is present in the room with her, the last spoke with patient at 9:00 PM last night. Her sister got concerned today when she had not heard from the patient and found out that she missed her hair appointment. The sister states patient lives alone and is very independent, she drives and is usually on time to everything. When she went to her home today, she found the patient sitting on a couch downstairs in her underpants and a T-shirt. It is abnormal for patient to go downstairs without getting dressed in regular clothes. Patient was making words that did not make sense. Sister reports pt has had a cold for the past week. Unknown if she'd had fevers. Over the past year, pt had a few episodes of where she would space out and be tremulous, had negative workups for stroke and seizures. Level V caveat due to AMS.  Patient is a 63 y.o. female presenting with altered mental status. The history is provided by a relative.  Altered Mental Status   Past Medical History  Diagnosis Date  . Cataract   . Depression   . Anxiety   . HTN (hypertension)   . Hyperlipemia    Past Surgical History  Procedure Laterality Date  . Cyst removal bilateral wrists     Family History  Problem Relation Age of Onset  . Heart attack Mother 62  . Stroke Mother   . Hypertension Mother   . Heart attack Father 42   History  Substance Use Topics  . Smoking status: Former Smoker -- 0.50 packs/day for 40 years     Types: Cigarettes    Quit date: 07/09/2011  . Smokeless tobacco: Not on file  . Alcohol Use: No   OB History   Grav Para Term Preterm Abortions TAB SAB Ect Mult Living                 Review of Systems  Unable to perform ROS: Mental status change      Allergies  Review of patient's allergies indicates no known allergies.  Home Medications   Prior to Admission medications   Medication Sig Start Date End Date Taking? Authorizing Provider  escitalopram (LEXAPRO) 10 MG tablet Take 10 mg by mouth daily.   Yes Historical Provider, MD  FLUVIRIN INJ injection Inject 0.5 mLs into the muscle once.  02/20/13  Yes Historical Provider, MD  lisinopril (PRINIVIL,ZESTRIL) 20 MG tablet Take 1 tablet (20 mg total) by mouth daily. 10/17/13  Yes Tonye Pearson, MD  simvastatin (ZOCOR) 20 MG tablet Take 1 tablet (20 mg total) by mouth daily. 04/18/13  Yes Tonye Pearson, MD  sodium fluoride (PREVIDENT 5000 PLUS) 1.1 % CREA dental cream Place 1 application onto teeth every evening.   Yes Historical Provider, MD   BP 158/98  Pulse 86  Temp(Src) 101.3 F (38.5 C) (Rectal)  Resp 20  Wt 242 lb 9 oz (110.026 kg)  SpO2 96% Physical Exam  Nursing note and vitals reviewed. Constitutional: She appears ill.  Flailing around.  HENT:  Head: Normocephalic and atraumatic.  Nose: Nose normal.  Dry MM.  Eyes: Conjunctivae are normal. Pupils are equal, round, and reactive to light.  Neck: Trachea normal. Neck supple.  Cardiovascular: Regular rhythm and intact distal pulses.  Tachycardia present.   Pulmonary/Chest: Effort normal and breath sounds normal. No respiratory distress.  Abdominal: Normal appearance. She exhibits no distension.  Musculoskeletal: She exhibits no edema.  Neurological: GCS eye subscore is 4. GCS verbal subscore is 3. GCS motor subscore is 4.  Skin:  Skin cool, clammy.    ED Course  LUMBAR PUNCTURE Date/Time: 01/18/2014 9:18 PM Performed by: Trevor Mace Authorized  by: Trevor Mace Consent: written consent obtained. Risks and benefits: risks, benefits and alternatives were discussed Consent given by: relative (sister) Procedure consent: procedure consent matches procedure scheduled Relevant documents: relevant documents present and verified Test results: test results available and properly labeled Site marked: the operative site was marked Patient identity confirmed: arm band Time out: Immediately prior to procedure a "time out" was called to verify the correct patient, procedure, equipment, support staff and site/side marked as required. Indications: evaluation for infection and evaluation for altered mental status Anesthesia: local infiltration Local anesthetic: lidocaine 1% without epinephrine Anesthetic total: 3 ml Patient sedated: no Preparation: Patient was prepped and draped in the usual sterile fashion. Lumbar space: L3-L4 interspace Patient's position: right lateral decubitus Needle gauge: 18 Needle type: spinal needle - Quincke tip Needle length: 3.5 in Number of attempts: 2 Fluid appearance: cloudy Tubes of fluid: 4 Total volume: 8 ml Post-procedure: site cleaned and adhesive bandage applied Patient tolerance: Patient tolerated the procedure well with no immediate complications.   (including critical care time)  CRITICAL CARE Performed by: Johnnette Gourd   Total critical care time: 90 minutes  Critical care time was exclusive of separately billable procedures and treating other patients.  Critical care was necessary to treat or prevent imminent or life-threatening deterioration.  Critical care was time spent personally by me on the following activities: development of treatment plan with patient and/or surrogate as well as nursing, discussions with consultants, evaluation of patient's response to treatment, examination of patient, obtaining history from patient or surrogate, ordering and performing treatments and  interventions, ordering and review of laboratory studies, ordering and review of radiographic studies, pulse oximetry and re-evaluation of patient's condition.  Labs Review Labs Reviewed  CBC WITH DIFFERENTIAL - Abnormal; Notable for the following:    WBC 13.9 (*)    RBC 5.26 (*)    MCH 25.9 (*)    Neutrophils Relative % 92 (*)    Neutro Abs 12.8 (*)    Lymphocytes Relative 2 (*)    Lymphs Abs 0.3 (*)    All other components within normal limits  COMPREHENSIVE METABOLIC PANEL - Abnormal; Notable for the following:    Potassium 3.3 (*)    Chloride 95 (*)    Glucose, Bld 185 (*)    Total Protein 8.6 (*)    GFR calc non Af Amer 64 (*)    GFR calc Af Amer 74 (*)    Anion gap 18 (*)    All other components within normal limits  URINALYSIS, ROUTINE W REFLEX MICROSCOPIC - Abnormal; Notable for the following:    Hgb urine dipstick MODERATE (*)    Protein, ur 100 (*)    All other components within normal limits  SALICYLATE LEVEL - Abnormal; Notable for the following:    Salicylate Lvl <2.0 (*)    All other components  within normal limits  I-STAT CG4 LACTIC ACID, ED - Abnormal; Notable for the following:    Lactic Acid, Venous 5.69 (*)    All other components within normal limits  I-STAT TROPOININ, ED - Abnormal; Notable for the following:    Troponin i, poc 0.63 (*)    All other components within normal limits  CULTURE, BLOOD (ROUTINE X 2)  CULTURE, BLOOD (ROUTINE X 2)  URINE CULTURE  ACETAMINOPHEN LEVEL  URINE RAPID DRUG SCREEN (HOSP PERFORMED)  URINE MICROSCOPIC-ADD ON  INFLUENZA PANEL BY PCR (TYPE A & B, H1N1)    Imaging Review Ct Head Wo Contrast  01/18/2014   CLINICAL DATA:  Indication a, acting inappropriately  EXAM: CT HEAD WITHOUT CONTRAST  TECHNIQUE: Contiguous axial images were obtained from the base of the skull through the vertex without intravenous contrast.  COMPARISON:  MRI brain 05/17/2013  FINDINGS: There is no evidence of mass effect, midline shift or  extra-axial fluid collections. There is no evidence of a space-occupying lesion or intracranial hemorrhage. There is no evidence of a cortical-based area of acute infarction. There is slight asymmetry of the cerebellar peduncles with the left cerebellar peduncle slightly lower in density relative to the right.  The ventricles and sulci are appropriate for the patient's age. The basal cisterns are patent.  Visualized portions of the orbits are unremarkable. The visualized portions of the paranasal sinuses and mastoid air cells are unremarkable.  The osseous structures are unremarkable.  IMPRESSION: There is slight asymmetry of the cerebellar peduncles with the left cerebellar peduncle slightly lower in density relative to the right. This may reflect normal variance versus an infarct. If there is further clinical concern recommend MRI.  Otherwise no acute intracranial pathology.   Electronically Signed   By: Elige Ko   On: 01/18/2014 17:28   Dg Chest Port 1 View  01/18/2014   CLINICAL DATA:  Altered mental status  EXAM: PORTABLE CHEST - 1 VIEW  COMPARISON:  10/11/2012  FINDINGS: Hypoventilation with bibasilar atelectasis. Mild cardiac enlargement without heart failure or effusion.  IMPRESSION: Hypoventilation with mild bibasilar atelectasis.   Electronically Signed   By: Marlan Palau M.D.   On: 01/18/2014 16:45     EKG Interpretation   Date/Time:  Friday January 18 2014 16:06:52 EDT Ventricular Rate:  114 PR Interval:  170 QRS Duration: 75 QT Interval:  337 QTC Calculation: 464 R Axis:   -4 Text Interpretation:  Sinus tachycardia Probable left atrial enlargement  Minimal ST depression, anterolateral leads ST elevation, consider inferior  injury in lead III Baseline wander in lead(s) III aVF V6 Confirmed by  DOCHERTY  MD, MEGAN ([id number]) on 01/18/2014 4:13:10 PM      MDM   Final diagnoses:  Sepsis, due to unspecified organism  Altered mental status, unspecified altered mental status type    Patient presenting with altered mental status. On arrival she is febrile and tachycardic. Level II code sepsis initiated. Given an initial EKG, troponin was obtained. During this EKG, patient was flailing around. Repeat EKG normal. Troponin noted to be elevated at 0.63. Urinalysis clear. Lactic acid 5.69. Leukocytosis of 13.9. Chest x-ray clear. Head CT showing concern for possible infarct versus normal variant. I spoke with Dr. Roseanne Reno with neurology who states this is most likely artifact versus infarct, however advise is to order MRI. I also spoke with Dr. Jens Som with cardiology regarding troponin, and with a normal EKG and sepsis, troponin most likely elevated due to sepsis. Unknown source of sepsis. Doubt  meningitis. Patient was flailing around, moving her neck and back without any difficulty. Broad-spectrum antibiotics given. Patient will be admitted for further care. Admission accepted by family practice to step down.  Case discussed with attending Dr. Micheline Maze who also evaluated patient and agrees with plan of care.  Addendum: LP requested by family medicine resident. Pt given ativan and fentanyl, was cooperative for LP. Performed by me with Dr. Micheline Maze at bedside. Cloudy CSF. Suspicion for meningitis now high on differential. Family medicine updated. Admitted to step-down.  Trevor Mace, PA-C 01/18/14 1831  Trevor Mace, PA-C 01/19/14 [phone]

## 2014-01-18 NOTE — ED Notes (Signed)
Lab results given to Robin, PA 

## 2014-01-18 NOTE — ED Notes (Signed)
EDP, PA, and Nurse at bedside when given result of i-stat cg4

## 2014-01-18 NOTE — ED Notes (Signed)
Admit Doctor at bedside while IV team and ED nurse attempting IV.  Stated the need of lumbar puncture to be performed before patient is transported to floor.

## 2014-01-18 NOTE — ED Notes (Signed)
Called and transported patient to CT by Nurse and Nurse tech.

## 2014-01-18 NOTE — ED Notes (Signed)
Attempted 3rd IV with [place] unable to obtain IV spoke with IV team who will attempt IV. Provider at bedside ordered ativan patient continued to flex arms.

## 2014-01-18 NOTE — ED Notes (Signed)
Provider at bedside

## 2014-01-18 NOTE — H&P (Signed)
Family Medicine Teaching [facility] Admission History and Physical Service Pager: [phone]  Patient name: [name] [name] Medical record number: [id number] Date of birth: [birth year] Age: 63 y.o. Gender: female  Primary Care Provider: Tonye Pearson, MD Consultants: Cardiology, Neurology  Code Status: full   Chief Complaint: Altered mental status  Assessment and Plan: [name] [name] is a 64 y.o. female presenting with altered mental status suspected due to meningitis. PMH is significant for depression, anxiety, hypertension and hyperlipidemia.   #Altered mental status 2/2 to sepsis with unclear source: Initially meeding SIRS with tachycardia (114), fever 101.3 with a mild leukocytosis (13.9).  CT head showed possible infarct versus normal variant. Dr. Roseanne Reno with neurology consulted in ED advised to order MRI. CXR and UA not showing any signs of infection. Lactic acid (5.69) elevated but trending down.  Flu PCR negative. PT/INR and APTT were within normal range. Tylenol and salicylate levels were normal. Ammonia levels were normal. TSH in normal range. ABG showing a respiratory alkalosis. UDS and ethanol level were normal. Patient is freely moving with no loss of coordination to suggest stroke.  Not appearing to be a seizure and not post ictal.  Initially tachycardic but now regular rate and no extra work of breathing or hemoptysis to suggest PE. Presuming bacterial meningitis with fever, change in mental status and pain with head movements.  - Admitted to step down, Dr. Lum Babe attending  - Appreciated Neurology rec's  - CSF cultures, gram stain, cell studies, enterovirus pcr, HSV PCR - Blood cultures  - urine cultures  - HIV, RPR  - Vanc, zosyn and acyclovir per pharmacy  - neuro check Q2 - neurovascular checks Q4  - received 2 L bolus in ED; bolus 1 L on floor - f/u MRI   #Elevated Troponin: Dr. Jens Som with cardiology, called in ED, thought elevated troponin, and with a  normal EKG and sepsis, it is most likely elevated due to sepsis. - appreciate Cardiology rec's  - EKG AM  - Cycle troponin's   #HTN: appears stable currently. Holding lisinopril 20 mg daily  #HLD: stable. On simvastatin 20 mg daily  #Depression/Anxiety: holding all home (lexapro 10 mg) medications.   FEN/GI: NPO, NS 100 mL/hr Prophylaxis: Heparin subq  Disposition: admitted to family medicine teaching service for treatment for meningitis  History of Present Illness: [name] [name] is a 63 y.o. female presenting with altered mental status.  She arrived to the ED via EMs after she was found at home at 3 pm today by her sister. Her sister last spoke with her around 9 pm the night before and she was talking normally. Her sister found her in her house today, in her underwear and a t-shirt. She was sitting in her own fecal matter and was not responding to stimuli.  She was only complaining of URI type symptoms during this past week but said she was feeling better last night. She denied any fevers or chills to her sister. She did complain of a headache to her sister during this past week. She has not traveled recently. Her sister doesn't report any rashes or bug bites.   Review Of Systems: Per HPI with the following additions: see HPI  Otherwise 12 point review of systems was performed and was unremarkable.  Patient Active Problem List   Diagnosis Date Noted  . Sepsis 01/18/2014  . Depression, reactive 10/12/2012  . Chronic neutropenia 04/14/2012  . Hyperlipemia 10/20/2011  . Anemia 10/20/2011  . HTN (hypertension) 10/20/2011  .  BMI 32.0-32.9,adult 10/20/2011   Past Medical History: Past Medical History  Diagnosis Date  . Cataract   . Depression   . Anxiety   . HTN (hypertension)   . Hyperlipemia    Past Surgical History: Past Surgical History  Procedure Laterality Date  . Cyst removal bilateral wrists     Social History: History  Substance Use Topics  . Smoking status: Former  Smoker -- 0.50 packs/day for 40 years    Types: Cigarettes    Quit date: 07/09/2011  . Smokeless tobacco: Not on file  . Alcohol Use: No   Additional social history: quit smoking a few years ago, never drank EtOH or used illicit drugs.  Please also refer to relevant sections of EMR.  Family History: Family History  Problem Relation Age of Onset  . Heart attack Mother 62  . Stroke Mother   . Hypertension Mother   . Heart attack Father 20   Allergies and Medications: No Known Allergies No current facility-administered medications on file prior to encounter.   Current Outpatient Prescriptions on File Prior to Encounter  Medication Sig Dispense Refill  . escitalopram (LEXAPRO) 10 MG tablet Take 10 mg by mouth daily.      Marland Kitchen FLUVIRIN INJ injection Inject 0.5 mLs into the muscle once.       Marland Kitchen lisinopril (PRINIVIL,ZESTRIL) 20 MG tablet Take 1 tablet (20 mg total) by mouth daily.  90 tablet  1  . simvastatin (ZOCOR) 20 MG tablet Take 1 tablet (20 mg total) by mouth daily.  90 tablet  3    Objective: BP 158/98  Pulse 86  Temp(Src) 101.3 F (38.5 C) (Rectal)  Resp 20  Wt 242 lb 9 oz (110.026 kg)  SpO2 96% Exam: General: flailing around, appears toxic  HEENT: pupils sluggish to light, patient with no gag reflex, brudzinski's sign positive, pupils were equal, dry MM, oropharynx clear, TM's unable to examined.  Cardiovascular: Tachycardic, regular rhythm, S1S2, no m,r,g Respiratory: CTAB, no extra work of breathing, on Room air,  Abdomen: soft, NTND, +BS, no HSM  Extremities: moving all extremities freely, +2 pulses in UE/LE b/l, no edema, 5/5 strength in UE/LE b/l, negative babinski's sign  Skin: no rashes, cool and clammy  Neuro: moving or pressing on patient's head causes and elucidates a response. Otherwise she was not responding to stimuli from sternal rub, gag reflex.   Labs and Imaging: CBC BMET   Recent Labs Lab 01/18/14 1615  WBC 13.9*  HGB 13.6  HCT 43.2  PLT 232     Recent Labs Lab 01/18/14 1615  NA 138  K 3.3*  CL 95*  CO2 25  BUN 16  CREATININE 0.94  GLUCOSE 185*  CALCIUM 9.6      Recent Labs Lab 01/18/14 2011  PHART 7.474*  PCO2ART 28.1*  PO2ART 69.0*  HCO3 20.6  TCO2 21  O2SAT 95.0   Urinalysis    Component Value Date/Time   COLORURINE YELLOW 01/18/2014 1632   APPEARANCEUR CLEAR 01/18/2014 1632   LABSPEC 1.016 01/18/2014 1632   PHURINE 6.5 01/18/2014 1632   GLUCOSEU NEGATIVE 01/18/2014 1632   HGBUR MODERATE* 01/18/2014 1632   BILIRUBINUR NEGATIVE 01/18/2014 1632   BILIRUBINUR neg 10/17/2013 1327   KETONESUR NEGATIVE 01/18/2014 1632   PROTEINUR 100* 01/18/2014 1632   PROTEINUR neg 10/17/2013 1327   UROBILINOGEN 0.2 01/18/2014 1632   UROBILINOGEN 0.2 10/17/2013 1327   NITRITE NEGATIVE 01/18/2014 1632   NITRITE neg 10/17/2013 1327   LEUKOCYTESUR NEGATIVE 01/18/2014 1632  Drugs of Abuse     Component Value Date/Time   LABOPIA NONE DETECTED 01/18/2014 1632   COCAINSCRNUR NONE DETECTED 01/18/2014 1632   LABBENZ NONE DETECTED 01/18/2014 1632   AMPHETMU NONE DETECTED 01/18/2014 1632   THCU NONE DETECTED 01/18/2014 1632   LABBARB NONE DETECTED 01/18/2014 1632    Influenza PCR: negative     CT head:  IMPRESSION:  There is slight asymmetry of the cerebellar peduncles with the left  cerebellar peduncle slightly lower in density relative to the right.  This may reflect normal variance versus an infarct. If there is  further clinical concern recommend MRI.  Otherwise no acute intracranial pathology.  CXR:  IMPRESSION:  Hypoventilation with mild bibasilar atelectasis.  EKG: sinus tachycardia   Myra Rude, MD 01/18/2014, 6:30 PM PGY-2, [facility] Family Medicine FPTS Intern pager: [phone], text pages welcome

## 2014-01-18 NOTE — ED Notes (Signed)
First set of blood cultures x1 at 1649

## 2014-01-18 NOTE — ED Notes (Signed)
Returned from CT.

## 2014-01-18 NOTE — ED Notes (Signed)
Pt to ED via [facility] from home.  Pt was found by family at 1430 today- pt noted to be aphasic and acting inappropriately.  Pt lives by herself at home- family last spoke to pt was last night at 9pm.  Pt currently responding to pain and moving all extremities.  CBG 170.  Family at bedside.

## 2014-01-18 NOTE — Progress Notes (Addendum)
ANTIBIOTIC CONSULT NOTE - INITIAL  Pharmacy Consult for Vancomycin / Zosyn / Acyclovir Indication: rule out sepsis  No Known Allergies  Patient Measurements: Weight: 238 lb 1.6 oz (108 kg)  Medical History: Past Medical History  Diagnosis Date  . Cataract   . Depression   . Anxiety   . HTN (hypertension)   . Hyperlipemia    Assessment: 63 year old female found at home by family.  Starting broad spectrum antibiotics for ? Sepsis  Goal of Therapy:  Vancomycin trough level 15-20 mcg/ml Appropriate Zosyn dosing  Plan:  Zosyn 3.375 grams iv Q 8 hours - 4 hr infusion Vancomycin 1 gram iv Q 12 hours Follow up Scr, progress, fever trend, cultures  Thank you. Okey Regal, PharmD [phone]  01/18/2014,4:23 PM  Adding acyclovir  Acyclovir 605 mg iv Q 8 hours (based on IBW)  Thank you. Okey Regal, PharmD

## 2014-01-19 DIAGNOSIS — R509 Fever, unspecified: Secondary | ICD-10-CM

## 2014-01-19 DIAGNOSIS — E785 Hyperlipidemia, unspecified: Secondary | ICD-10-CM

## 2014-01-19 DIAGNOSIS — I10 Essential (primary) hypertension: Secondary | ICD-10-CM

## 2014-01-19 DIAGNOSIS — F329 Major depressive disorder, single episode, unspecified: Secondary | ICD-10-CM

## 2014-01-19 DIAGNOSIS — R4182 Altered mental status, unspecified: Secondary | ICD-10-CM

## 2014-01-19 DIAGNOSIS — R51 Headache: Secondary | ICD-10-CM

## 2014-01-19 DIAGNOSIS — I519 Heart disease, unspecified: Secondary | ICD-10-CM

## 2014-01-19 DIAGNOSIS — R7989 Other specified abnormal findings of blood chemistry: Secondary | ICD-10-CM

## 2014-01-19 DIAGNOSIS — F3289 Other specified depressive episodes: Secondary | ICD-10-CM

## 2014-01-19 LAB — BASIC METABOLIC PANEL
Anion gap: 17 — ABNORMAL HIGH (ref 5–15)
BUN: 16 mg/dL (ref 6–23)
CALCIUM: 8.1 mg/dL — AB (ref 8.4–10.5)
CO2: 17 mEq/L — ABNORMAL LOW (ref 19–32)
Chloride: 105 mEq/L (ref 96–112)
Creatinine, Ser: 0.67 mg/dL (ref 0.50–1.10)
GLUCOSE: 122 mg/dL — AB (ref 70–99)
Potassium: 3.7 mEq/L (ref 3.7–5.3)
Sodium: 139 mEq/L (ref 137–147)

## 2014-01-19 LAB — CBC
HCT: 37.2 % (ref 36.0–46.0)
HEMOGLOBIN: 11.8 g/dL — AB (ref 12.0–15.0)
MCH: 25.4 pg — AB (ref 26.0–34.0)
MCHC: 31.7 g/dL (ref 30.0–36.0)
MCV: 80 fL (ref 78.0–100.0)
Platelets: 213 10*3/uL (ref 150–400)
RBC: 4.65 MIL/uL (ref 3.87–5.11)
RDW: 12.7 % (ref 11.5–15.5)
WBC: 16.7 10*3/uL — ABNORMAL HIGH (ref 4.0–10.5)

## 2014-01-19 LAB — STREP PNEUMONIAE URINARY ANTIGEN: Strep Pneumo Urinary Antigen: POSITIVE — AB

## 2014-01-19 LAB — TROPONIN I
Troponin I: 2.83 ng/mL (ref <0.30)
Troponin I: 3.27 ng/mL (ref <0.30)
Troponin I: 5.31 ng/mL (ref <0.30)

## 2014-01-19 LAB — HEMOGLOBIN A1C
HEMOGLOBIN A1C: 6.3 % — AB (ref <5.7)
Mean Plasma Glucose: 134 mg/dL — ABNORMAL HIGH (ref <117)

## 2014-01-19 LAB — HERPES SIMPLEX VIRUS(HSV) DNA BY PCR
HSV 1 DNA: NOT DETECTED
HSV 2 DNA: NOT DETECTED

## 2014-01-19 LAB — LACTIC ACID, PLASMA: LACTIC ACID, VENOUS: 1.5 mmol/L (ref 0.5–2.2)

## 2014-01-19 LAB — URINE CULTURE
CULTURE: NO GROWTH
Colony Count: NO GROWTH

## 2014-01-19 LAB — GLUCOSE, CAPILLARY: Glucose-Capillary: 109 mg/dL — ABNORMAL HIGH (ref 70–99)

## 2014-01-19 LAB — HIV ANTIBODY (ROUTINE TESTING W REFLEX): HIV 1&2 Ab, 4th Generation: NONREACTIVE

## 2014-01-19 LAB — RPR

## 2014-01-19 MED ORDER — SODIUM CHLORIDE 0.9 % IV SOLN
2.0000 g | INTRAVENOUS | Status: DC
Start: 2014-01-19 — End: 2014-01-19
  Administered 2014-01-19: 2 g via INTRAVENOUS
  Filled 2014-01-19 (×4): qty 2000

## 2014-01-19 MED ORDER — SODIUM CHLORIDE 0.9 % IV SOLN
2.0000 g | INTRAVENOUS | Status: DC
  Administered 2014-01-19 – 2014-01-20 (×5): 2 g via INTRAVENOUS
  Filled 2014-01-19 (×9): qty 2000

## 2014-01-19 MED ORDER — MORPHINE SULFATE 2 MG/ML IJ SOLN
1.0000 mg | INTRAMUSCULAR | Status: DC | PRN
Start: 2014-01-19 — End: 2014-01-29
  Administered 2014-01-19 – 2014-01-23 (×7): 1 mg via INTRAVENOUS
  Filled 2014-01-19 (×7): qty 1

## 2014-01-19 MED ORDER — DEXTROSE 5 % IV SOLN
2.0000 g | Freq: Two times a day (BID) | INTRAVENOUS | Status: DC
  Administered 2014-01-19 – 2014-01-29 (×21): 2 g via INTRAVENOUS
  Filled 2014-01-19 (×25): qty 2

## 2014-01-19 MED ORDER — DEXAMETHASONE SODIUM PHOSPHATE 10 MG/ML IJ SOLN
10.0000 mg | Freq: Four times a day (QID) | INTRAMUSCULAR | Status: DC
  Administered 2014-01-19 – 2014-01-23 (×15): 10 mg via INTRAVENOUS
  Filled 2014-01-19 (×18): qty 1

## 2014-01-19 MED ORDER — LORAZEPAM 2 MG/ML IJ SOLN
1.0000 mg | Freq: Four times a day (QID) | INTRAMUSCULAR | Status: DC | PRN
  Administered 2014-01-19 – 2014-01-24 (×8): 1 mg via INTRAVENOUS
  Filled 2014-01-19 (×9): qty 1

## 2014-01-19 MED ORDER — SODIUM CHLORIDE 0.9 % IV SOLN
INTRAVENOUS | Status: DC
  Administered 2014-01-21: 1 mL via INTRAVENOUS
  Administered 2014-01-21 – 2014-01-23 (×3): via INTRAVENOUS

## 2014-01-19 MED ORDER — CETYLPYRIDINIUM CHLORIDE 0.05 % MT LIQD
7.0000 mL | Freq: Two times a day (BID) | OROMUCOSAL | Status: DC
  Administered 2014-01-19 – 2014-01-29 (×19): 7 mL via OROMUCOSAL

## 2014-01-19 MED ORDER — LORAZEPAM 2 MG/ML IJ SOLN
1.0000 mg | Freq: Once | INTRAMUSCULAR | Status: AC
  Administered 2014-01-19: 1 mg via INTRAVENOUS
  Filled 2014-01-19: qty 1

## 2014-01-19 NOTE — Consult Note (Signed)
CONSULT NOTE  Date: 01/19/2014               Patient Name:  [name] [name] MRN: [id number]  DOB: 1951/04/25 Age / Sex: 63 y.o., female        PCP: Tonye Pearson Primary Cardiologist: Clifton James            Referring Physician: Lum Babe              Reason for Consult: + Troponin            History of Present Illness: Patient is a 63 y.o. female with a PMHx of hypertension, hyperlipidemia, history of cigarette smoking, who was admitted to [facility] on 01/18/2014 for evaluation of altered mental status. She was diagnosed as having meningitis.  She was found to have a positive troponin level and we were asked to see her in consultation.  No history was obtainable from the patient . I was able to talk with her daughter. She's not had any episodes of chest pain or shortness of breath. She has a headache and a bad cold for the past week.    She reported had some chest pain back in August, 2014. A stress Myoview study was negative for ischemia.  2 normal left ventricular systolic function with an ejection fraction of 74%.  Medications: Outpatient medications: Prescriptions prior to admission  Medication Sig Dispense Refill  . diphenhydramine-acetaminophen (TYLENOL PM) 25-500 MG TABS Take 2 tablets by mouth at bedtime as needed (for sleep).      Marland Kitchen escitalopram (LEXAPRO) 10 MG tablet Take 10 mg by mouth daily.      Marland Kitchen lisinopril (PRINIVIL,ZESTRIL) 20 MG tablet Take 1 tablet (20 mg total) by mouth daily.  90 tablet  1  . simvastatin (ZOCOR) 20 MG tablet Take 1 tablet (20 mg total) by mouth daily.  90 tablet  3    Current medications: Current Facility-Administered Medications  Medication Dose Route Frequency Provider Last Rate Last Dose  . 0.9 %  sodium chloride infusion   Intravenous Continuous Tommie Sams, DO 100 mL/hr at 01/19/14 1000    . acetaminophen (TYLENOL) tablet 650 mg  650 mg Oral Q6H PRN Myra Rude, MD       Or  . acetaminophen (TYLENOL) suppository 650 mg  650 mg  Rectal Q6H PRN Myra Rude, MD      . ampicillin (OMNIPEN) 2 g in sodium chloride 0.9 % 50 mL IVPB  2 g Intravenous 6 times per day Margie Billet, RPH      . antiseptic oral rinse (CPC / CETYLPYRIDINIUM CHLORIDE 0.05%) solution 7 mL  7 mL Mouth Rinse BID Myra Rude, MD   7 mL at 01/19/14 0800  . cefTRIAXone (ROCEPHIN) 2 g in dextrose 5 % 50 mL IVPB  2 g Intravenous Q12H Jayce G Cook, DO   2 g at 01/19/14 1236  . dexamethasone (DECADRON) injection 10 mg  10 mg Intravenous 4 times per day Judyann Munson, MD      . heparin injection 5,000 Units  5,000 Units Subcutaneous 3 times per day Myra Rude, MD   5,000 Units at 01/19/14 [phone]  . LORazepam (ATIVAN) injection 1 mg  1 mg Intravenous Q6H PRN Tommie Sams, DO      . vancomycin (VANCOCIN) IVPB 1000 mg/200 mL premix  1,000 mg Intravenous Q12H Toy Cookey, MD   1,000 mg at 01/19/14 0430     No Known  Allergies   Past Medical History  Diagnosis Date  . Cataract   . Depression   . Anxiety   . HTN (hypertension)   . Hyperlipemia     Past Surgical History  Procedure Laterality Date  . Cyst removal bilateral wrists      Family History  Problem Relation Age of Onset  . Heart attack Mother 33  . Stroke Mother   . Hypertension Mother   . Heart attack Father 21    Social History:  reports that she quit smoking about 2 years ago. Her smoking use included Cigarettes. She has a 20 pack-year smoking history. She does not have any smokeless tobacco history on file. She reports that she does not drink alcohol or use illicit drugs.   Review of Systems: ROS was unobtainable because of her mental status changes.   Physical Exam: BP 143/55  Pulse 41  Temp(Src) 100.3 F (37.9 C) (Oral)  Resp 25  Ht 5' 6.53" (1.69 m)  Wt 242 lb 9 oz (110.026 kg)  BMI 38.52 kg/m2  SpO2 100%  Wt Readings from Last 3 Encounters:  01/18/14 242 lb 9 oz (110.026 kg)  10/17/13 238 lb (107.956 kg)  05/22/13 236 lb (107.049 kg)    General:  Vital signs reviewed and noted. somulent  Head: Normocephalic, atraumatic, sclera anicteric,   Neck: Supple. Negative for carotid bruits. No JVD   Lungs:  Clear bilaterally, no  wheezes, rales, or rhonchi. Breathing is normal   Heart: RRR with S1 S2. No murmurs, rubs, or gallops   Abdomen:  Soft, non-tender, non-distended with normoactive bowel sounds. No hepatomegaly. No rebound/guarding. No obvious abdominal masses   MSK: Strength and the appear normal for age.   Extremities: No clubbing or cyanosis. No edema.  Distal pedal pulses are 2+ and equal   Neurologic: Lethargic, unresponsive   Psych: NA    Lab results: Basic Metabolic Panel:  Recent Labs Lab 01/18/14 1615 01/19/14 0253  NA 138 139  K 3.3* 3.7  CL 95* 105  CO2 25 17*  GLUCOSE 185* 122*  BUN 16 16  CREATININE 0.94 0.67  CALCIUM 9.6 8.1*    Liver Function Tests:  Recent Labs Lab 01/18/14 1615  AST 33  ALT 25  ALKPHOS 105  BILITOT 0.7  PROT 8.6*  ALBUMIN 3.9   No results found for this basename: LIPASE, AMYLASE,  in the last 168 hours  Recent Labs Lab 01/18/14 1939  AMMONIA 47    CBC:  Recent Labs Lab 01/18/14 1615 01/19/14 0253  WBC 13.9* 16.7*  NEUTROABS 12.8*  --   HGB 13.6 11.8*  HCT 43.2 37.2  MCV 82.1 80.0  PLT 232 213    Cardiac Enzymes:  Recent Labs Lab 01/18/14 2338 01/19/14 0253  TROPONINI 2.83* 3.27*    BNP: No components found with this basename: POCBNP,   CBG:  Recent Labs Lab 01/19/14 0024  GLUCAP 109*    Coagulation Studies:  Recent Labs  01/18/14 1943  LABPROT 15.3*  INR 1.21     Other results:  EKG: Normal sinus rhythm at 97. She has nonspecific ST-T wave changes.   Imaging: Ct Head Wo Contrast  01/18/2014   CLINICAL DATA:  Indication a, acting inappropriately  EXAM: CT HEAD WITHOUT CONTRAST  TECHNIQUE: Contiguous axial images were obtained from the base of the skull through the vertex without intravenous contrast.  COMPARISON:  MRI brain  05/17/2013  FINDINGS: There is no evidence of mass effect, midline shift or extra-axial  fluid collections. There is no evidence of a space-occupying lesion or intracranial hemorrhage. There is no evidence of a cortical-based area of acute infarction. There is slight asymmetry of the cerebellar peduncles with the left cerebellar peduncle slightly lower in density relative to the right.  The ventricles and sulci are appropriate for the patient's age. The basal cisterns are patent.  Visualized portions of the orbits are unremarkable. The visualized portions of the paranasal sinuses and mastoid air cells are unremarkable.  The osseous structures are unremarkable.  IMPRESSION: There is slight asymmetry of the cerebellar peduncles with the left cerebellar peduncle slightly lower in density relative to the right. This may reflect normal variance versus an infarct. If there is further clinical concern recommend MRI.  Otherwise no acute intracranial pathology.   Electronically Signed   By: Elige Ko   On: 01/18/2014 17:28   Dg Chest Port 1 View  01/18/2014   CLINICAL DATA:  Altered mental status  EXAM: PORTABLE CHEST - 1 VIEW  COMPARISON:  10/11/2012  FINDINGS: Hypoventilation with bibasilar atelectasis. Mild cardiac enlargement without heart failure or effusion.  IMPRESSION: Hypoventilation with mild bibasilar atelectasis.   Electronically Signed   By: Marlan Palau M.D.   On: 01/18/2014 16:45      Assessment & Plan:  1. Positive troponin levels:   patient has a history of cigarette smoking in the past. She also has a history of hypertension and hyperlipidemia. She denies any episodes of angina. She had a negative Myoview study in August, 2014. I suspect that her troponin elevation is do to demand ischemia.  At this point she still has severely altered mental status it is not a candidate for catheterization. Her EKG appears to be nonacute.  When she recovers, we can decide whether she needs a repeat Myoview  study or needs to have a heart catheterization.   Would recommend an echo to assess LV function.    2. Hypertension: The patient's blood pressure is fairly well controlled.  3. Meningitis:  Blood cultures show GPC in pairs.  CSF is no growth yet.  Plans per medicine service.     Vesta Mixer, Montez Hageman., MD, [facility] 01/19/2014, 1:23 PM Office - [phone] Pager [phone]

## 2014-01-19 NOTE — Progress Notes (Signed)
Family Medicine Teaching Service Daily Progress Note Intern Pager: [phone]  Patient name: [name] [name] Medical record number: [id number] Date of birth: 09-Jul-1950 Age: 63 y.o. Gender: female  Primary Care Provider: Tonye Pearson, MD Consultants: Cardiology, Neurology Code Status: Full Code  Pt Overview and Major Events to Date:  9/18 - Admitted for suspected meningitis 9/19 - Coverage changed to Vanc/CTX/Amp, ID consulted  Micro: Blood cx (9/18) CSF cx (9/18) Urine cx (9/18)  Abx: Vanc (9/18 >>>) Zosyn (9/18 - 9/19) CTX (9/19 >>>) Ampicillin (9/19>>>) Acyclovir (9/18 - 9/19)  Assessment and Plan: MALLI VALK is a 63 y.o. female presenting with altered mental status suspected due to meningitis. PMH is significant for depression, anxiety, hypertension and hyperlipidemia.   #Acute encephalopathy secondary to Sepsis from meningitis - Work up consistent with bacterial meningitis (CSF - >600 protein, <2 glucose, WBC 11321 (90% neutrophils).  - Patient continues to be altered and ill appearing - Empiric coverage overnight (Vanc/Zosyn/Acyclovir).  Changed to Vanc/CTX/Ampicillin.  Discussed with ID today who recommended D/C of Acyclovir.  ID to see today. - Awaiting cultures  - Blood, CSF, Urine, enterovirus pcr, HSV PCR  - HIV, RPR pending - Will continue neuro checks - Will need MRI during hospitalization (pending clinical improvement)  #Elevated Troponin - Likely secondary to demand ischemia in setting of Sepsis - Troponin trending up (see below) - Cardiology consulted this am. We appreciate their help in managing this patient.  #HTN - BP stable this am - Holding home ACEI  #HLD:  On simvastatin 20 mg daily  - Holding in setting of critical illness and NPO status.  #Depression/Anxiety: holding all home (lexapro 10 mg) medications.   FEN/GI: NPO, NS @ 100 mL/hr PPx: Heparin  Disposition: Stepdown. Patient ill appearing. Continuing Abx and will monitor  closely for improvement.   Subjective:  Unresponsive to commands. No acute overnight events. Family reports that she remains very agitated.  Objective: Temp:  [98.7 F (37.1 C)-101.3 F (38.5 C)] 99.4 F (37.4 C) (09/19 0300) Pulse Rate:  [41-104] 89 (09/19 0300) Resp:  [16-41] 26 (09/19 0600) BP: (124-168)/(73-126) 153/82 mmHg (09/19 0600) SpO2:  [94 %-100 %] 94 % (09/19 0000) Weight:  [238 lb 1.6 oz (108 kg)-242 lb 9 oz (110.026 kg)] 242 lb 9 oz (110.026 kg) (09/18 1630)  Physical Exam: General: ill appearing female, restless.  HEENT: PERRLA. Leftward gaze noted.  Cardiovascular: Tachycardic. No murmur noted. Respiratory: Increased RR. No adventitious lung sounds noted. Abdomen: soft, nontender, nondistended. Extremities: warm, well perfused. No edema noted.  Neuro: PEERLA. Leftward gaze.  Moves all extremities freely. Not following commands.   Laboratory:  Recent Labs Lab 01/18/14 1615 01/19/14 0253  WBC 13.9* 16.7*  HGB 13.6 11.8*  HCT 43.2 37.2  PLT 232 213    Recent Labs Lab 01/18/14 1615 01/19/14 0253  NA 138 139  K 3.3* 3.7  CL 95* 105  CO2 25 17*  BUN 16 16  CREATININE 0.94 0.67  CALCIUM 9.6 8.1*  PROT 8.6*  --   BILITOT 0.7  --   ALKPHOS 105  --   ALT 25  --   AST 33  --   GLUCOSE 185* 122*   POC Troponin - 0.63  Cardiac Panel (last 3 results)  Recent Labs  01/18/14 2338 01/19/14 0253  TROPONINI 2.83* 3.27*   Lactic acid - 3.27.  CSF - >600 protein, <2 glucose, WBC 11321 (90% neutrophils).  Micro: Blood cx (9/18) CSF cx (9/18) Urine cx (9/18)  Imaging/Diagnostic Tests:  Ct Head Wo Contrast 01/18/2014    IMPRESSION: There is slight asymmetry of the cerebellar peduncles with the left cerebellar peduncle slightly lower in density relative to the right. This may reflect normal variance versus an infarct. If there is further clinical concern recommend MRI.  Otherwise no acute intracranial pathology.    In ED, findings were discussed  with Neurologist Dr. Roseanne Reno.  He felt that this was artifact. Recommended MRI.  Dg Chest Port 1 View 01/18/2014   CLINICAL DATA:  Altered mental status  EXAM: PORTABLE CHEST - 1 VIEW  COMPARISON:  10/11/2012  FINDINGS: Hypoventilation with bibasilar atelectasis. Mild cardiac enlargement without heart failure or effusion.  IMPRESSION: Hypoventilation with mild bibasilar atelectasis.   Tommie Sams, DO 01/19/2014, 8:57 AM PGY-3, [place] [facility] FPTS Intern pager: [phone], text pages welcome

## 2014-01-19 NOTE — H&P (Signed)
[facility] ATTENDING ADMISSION NOTE Dellis Voght,MD I  have seen and examined this patient, reviewed their chart. I have discussed this patient with the resident. I agree with the resident's findings, assessment and care plan.  63 Y/O F with Pmx of HTN, HLD, Depression, Anemia presenting with change in mental status since yesterday. She was found at home by her sister. As per her sister she had URI which she has been battling for about one week associated with headache and orbital pain with sneezing, her sister is uncertain whether she had fever or not. There is hx of sick contact with a baby with URI, her sis also had URI but theirs resolved with no sequelae.Patient's sister mentioned that in Feb she had an episode of change in mental status where she will just gaze into the space not responding to people, she had MRI done then which was normal, all test done were normal then.  No current facility-administered medications on file prior to encounter.   Current Outpatient Prescriptions on File Prior to Encounter  Medication Sig Dispense Refill  . escitalopram (LEXAPRO) 10 MG tablet Take 10 mg by mouth daily.      Marland Kitchen lisinopril (PRINIVIL,ZESTRIL) 20 MG tablet Take 1 tablet (20 mg total) by mouth daily.  90 tablet  1  . simvastatin (ZOCOR) 20 MG tablet Take 1 tablet (20 mg total) by mouth daily.  90 tablet  3   Past Medical History  Diagnosis Date  . Cataract   . Depression   . Anxiety   . HTN (hypertension)   . Hyperlipemia    Filed Vitals:   01/19/14 0300 01/19/14 0400 01/19/14 0500 01/19/14 0600  BP: 124/74 137/73 146/73 153/82  Pulse: 89     Temp: 99.4 F (37.4 C)     TempSrc: Oral     Resp: 30 39 23 26  Height:      Weight:      SpO2:       Exam: Gen: Drowsy but arousable. A little restless as well. Neuro: Drowsy as above,does not respond to name call but moves my hand away when chest pressure was applied. She squeezed my hand with command. ++ DTR. Resp: Air entry equal and clear  B/L CV: S1 S2 normal, no murmur. Abd: Soft, NT/ND, BS+ normal. Ext: No edema.  A/P: 63 Y/O F with 1. AMS: /Sepsis/Meningitis vs CVA.     LP performed yesterday with WBC of 35701, protein > 600, Gluc <2 and PMN 92, this fit pic of bacterial meningitis.     Temp of 101.3 on admission and elevated WBC.     CFS culture and gram stain pending.     Blood cx pending.     Covered with Vanc and Zosyn and Acyclovir initially but may adjust this based on CFS analysis report.     Recommended HIV test in addition.     CT head reviewed ( Cannot r/o ischemia) I agree with obtaining MRI brain.     Neuro checks, fall precautions.     Neurology consult.  2. Elevated troponin: This might be due to increased cardiac demand from sepsis. EKG reviewed;not suggestive of ischemia.     Cycle troponin.      Repeat EKG. Continue telemetry monitoring, currently at step down for close monitoring.      Will benefit from ASA and Statin once able to swallow.  3. HTN/HLD: Permissible HTN in case of stroke but not > 180/100.     Start Statin when  awake and able to take oral agents.

## 2014-01-19 NOTE — Progress Notes (Signed)
  Echocardiogram 2D Echocardiogram has been performed.  Arvil Chaco 01/19/2014, 3:34 PM

## 2014-01-19 NOTE — Progress Notes (Signed)
CRITICAL VALUE ALERT  Critical value received:  Troponins 2.83  Date of notification:  01/19/14  Time of notification:  0100  Critical value read back:Yes.    Nurse who received alert:  Bolivar Haw, RN  MD notified (1st page):  Dr. Jordan Likes  Time of first page:  0130

## 2014-01-19 NOTE — Progress Notes (Signed)
CRITICAL VALUE ALERT  Critical value received:  Glucose CSF <<2    Date of notification:  01/18/14  Time of notification:  2350  Critical value read back:Yes.    Nurse who received alert:  Bufford Lope, RN  MD notified (1st page):  Dr. Macy Mis  Time of first page:  01/19/14 0016

## 2014-01-19 NOTE — Progress Notes (Signed)
Utilization Review Completed.[name] [name] T9/19/2015  

## 2014-01-19 NOTE — Progress Notes (Signed)
[facility]S ATTENDING  NOTE [name] Eniola,MD I  have seen and examined this patient, reviewed their chart. I have discussed this patient with the resident. I agree with the resident's findings, assessment and care plan. 

## 2014-01-19 NOTE — Progress Notes (Signed)
CRITICAL VALUE ALERT  Critical value received:  WBC's from CSF 11,321  Date of notification:  01/19/14  Time of notification:  1115  Critical value read back:Yes.    Nurse who received alert:  Bufford Lope, RN  MD notified (1st page):  Dr. Macy Mis  Time of first page:  1115-MD was at bedside

## 2014-01-19 NOTE — H&P (Addendum)
[facility]  Total days of antibiotics 2        Day 2 vanco        Day 2 ampicillin        Day 1 ceftriaxone (recvd 1 dose piptazo, acy)       Reason for Consult: bacterial meningitis    Referring Physician: eniola  Active Problems:   Sepsis   Altered mental status    HPI: [name] [name] is a 63 y.o. female  with HTN, HLD, depression presents with 1 day history of altered mental status, headache, with 1 week prodrome of URI symptoms. She was found to difficult to arousal/encephalopathic with nuchal rigidity,with fever of 101.3. Albs show leukocytosis of 16.3, LA of 5.6, LP shows neutrophilic pleocytosis of WBC 11,300 with 92%N, elevated protein ,and low glucose gram stain negative for organisms but still very concerning for bacterial meningitis. 2 of 2 sets of Blood cx + within 24hrs showing GPC in pairs. Remains critically ill still encephalopathic. Having elevated troponins in setting sepsis, likely demand ischemia per cardiology. Family reports last seen normal yesterday morning at 9am. Family reports that she has not been coherent since they saw her yesterday morning, has been moving all extremities, but right arm less so.  Past Medical History  Diagnosis Date  . Cataract   . Depression   . Anxiety   . HTN (hypertension)   . Hyperlipemia     Allergies: No Known Allergies  Current antibiotics:   MEDICATIONS: . ampicillin (OMNIPEN) IV  2 g Intravenous 6 times per day  . antiseptic oral rinse  7 mL Mouth Rinse BID  . cefTRIAXone (ROCEPHIN)  IV  2 g Intravenous Q12H  . dexamethasone  10 mg Intravenous 4 times per day  . heparin  5,000 Units Subcutaneous 3 times per day  . vancomycin  1,000 mg Intravenous Q12H    History  Substance Use Topics  . Smoking status: Former Smoker -- 0.50 packs/day for 40 years    Types: Cigarettes    Quit date: 07/09/2011  . Smokeless tobacco: Not on file  . Alcohol Use: No    Family History  Problem Relation  Age of Onset  . Heart attack Mother 27  . Stroke Mother   . Hypertension Mother   . Heart attack Father 30    Review of Systems -  Unable to obtain due to being encephalopathic  OBJECTIVE: Temp:  [98.7 F (37.1 C)-101.3 F (38.5 C)] 99.4 F (37.4 C) (09/19 0300) Pulse Rate:  [41-104] 89 (09/19 0300) Resp:  [16-41] 26 (09/19 0600) BP: (124-168)/(73-126) 153/82 mmHg (09/19 0600) SpO2:  [94 %-100 %] 94 % (09/19 0000) Weight:  [238 lb 1.6 oz (108 kg)-242 lb 9 oz (110.026 kg)] 242 lb 9 oz (110.026 kg) (09/18 1630) Physical Exam  Constitutional:  Appears stated age, appears mildly uncomfortable since she is moving/twisting in bed HENT: no thrush Neck = supple Mouth/Throat: Oropharynx is clear and moist. No oropharyngeal exudate.  Cardiovascular: Normal rate, regular rhythm and normal heart sounds. Exam reveals no gallop and no friction rub.  No murmur heard.  Pulmonary/Chest: Effort normal and breath sounds normal. No respiratory distress.  has no wheezes.  Abdominal: Soft. Bowel sounds are normal.  exhibits no distension. There is no tenderness.  Lymphadenopathy: no cervical adenopathy.  Neurological: encephalopathic, no following directions, moves all appendages. Opened eyes spontaneously Skin: Skin is warm and dry. No rash noted. No erythema. Cool feet and right arm,  possibly due to being uncovered from bedding. Not mottled   LABS: Results for orders placed during the hospital encounter of 01/18/14 (from the past 48 hour(s))  CBC WITH DIFFERENTIAL     Status: Abnormal   Collection Time    01/18/14  4:15 PM      Result Value Ref Range   WBC 13.9 (*) 4.0 - 10.5 K/uL   RBC 5.26 (*) 3.87 - 5.11 MIL/uL   Hemoglobin 13.6  12.0 - 15.0 g/dL   HCT 43.2  36.0 - 46.0 %   MCV 82.1  78.0 - 100.0 fL   MCH 25.9 (*) 26.0 - 34.0 pg   MCHC 31.5  30.0 - 36.0 g/dL   RDW 12.7  11.5 - 15.5 %   Platelets 232  150 - 400 K/uL   Neutrophils Relative % 92 (*) 43 - 77 %   Neutro Abs 12.8 (*) 1.7 -  7.7 K/uL   Lymphocytes Relative 2 (*) 12 - 46 %   Lymphs Abs 0.3 (*) 0.7 - 4.0 K/uL   Monocytes Relative 6  3 - 12 %   Monocytes Absolute 0.8  0.1 - 1.0 K/uL   Eosinophils Relative 0  0 - 5 %   Eosinophils Absolute 0.0  0.0 - 0.7 K/uL   Basophils Relative 0  0 - 1 %   Basophils Absolute 0.0  0.0 - 0.1 K/uL  COMPREHENSIVE METABOLIC PANEL     Status: Abnormal   Collection Time    01/18/14  4:15 PM      Result Value Ref Range   Sodium 138  137 - 147 mEq/L   Potassium 3.3 (*) 3.7 - 5.3 mEq/L   Chloride 95 (*) 96 - 112 mEq/L   CO2 25  19 - 32 mEq/L   Glucose, Bld 185 (*) 70 - 99 mg/dL   BUN 16  6 - 23 mg/dL   Creatinine, Ser 0.94  0.50 - 1.10 mg/dL   Calcium 9.6  8.4 - 10.5 mg/dL   Total Protein 8.6 (*) 6.0 - 8.3 g/dL   Albumin 3.9  3.5 - 5.2 g/dL   AST 33  0 - 37 U/L   ALT 25  0 - 35 U/L   Alkaline Phosphatase 105  39 - 117 U/L   Total Bilirubin 0.7  0.3 - 1.2 mg/dL   GFR calc non Af Amer 64 (*) >90 mL/min   GFR calc Af Amer 74 (*) >90 mL/min   Comment: (NOTE)     The eGFR has been calculated using the CKD EPI equation.     This calculation has not been validated in all clinical situations.     eGFR's persistently <90 mL/min signify possible Chronic Kidney     Disease.   Anion gap 18 (*) 5 - 15  SALICYLATE LEVEL     Status: Abnormal   Collection Time    01/18/14  4:15 PM      Result Value Ref Range   Salicylate Lvl <6.3 (*) 2.8 - 20.0 mg/dL  ACETAMINOPHEN LEVEL     Status: None   Collection Time    01/18/14  4:15 PM      Result Value Ref Range   Acetaminophen (Tylenol), Serum <15.0  10 - 30 ug/mL   Comment:            THERAPEUTIC CONCENTRATIONS VARY     SIGNIFICANTLY. A RANGE OF 10-30     ug/mL MAY BE AN EFFECTIVE     CONCENTRATION FOR  MANY PATIENTS.     HOWEVER, SOME ARE BEST TREATED     AT CONCENTRATIONS OUTSIDE THIS     RANGE.     ACETAMINOPHEN CONCENTRATIONS     >150 ug/mL AT 4 HOURS AFTER     INGESTION AND >50 ug/mL AT 12     HOURS AFTER INGESTION ARE      OFTEN ASSOCIATED WITH TOXIC     REACTIONS.  I-STAT CG4 LACTIC ACID, ED     Status: Abnormal   Collection Time    01/18/14  4:25 PM      Result Value Ref Range   Lactic Acid, Venous 5.69 (*) 0.5 - 2.2 mmol/L  URINALYSIS, ROUTINE W REFLEX MICROSCOPIC     Status: Abnormal   Collection Time    01/18/14  4:32 PM      Result Value Ref Range   Color, Urine YELLOW  YELLOW   APPearance CLEAR  CLEAR   Specific Gravity, Urine 1.016  1.005 - 1.030   pH 6.5  5.0 - 8.0   Glucose, UA NEGATIVE  NEGATIVE mg/dL   Hgb urine dipstick MODERATE (*) NEGATIVE   Bilirubin Urine NEGATIVE  NEGATIVE   Ketones, ur NEGATIVE  NEGATIVE mg/dL   Protein, ur 100 (*) NEGATIVE mg/dL   Urobilinogen, UA 0.2  0.0 - 1.0 mg/dL   Nitrite NEGATIVE  NEGATIVE   Leukocytes, UA NEGATIVE  NEGATIVE  URINE RAPID DRUG SCREEN (HOSP PERFORMED)     Status: None   Collection Time    01/18/14  4:32 PM      Result Value Ref Range   Opiates NONE DETECTED  NONE DETECTED   Cocaine NONE DETECTED  NONE DETECTED   Benzodiazepines NONE DETECTED  NONE DETECTED   Amphetamines NONE DETECTED  NONE DETECTED   Tetrahydrocannabinol NONE DETECTED  NONE DETECTED   Barbiturates NONE DETECTED  NONE DETECTED   Comment:            DRUG SCREEN FOR MEDICAL PURPOSES     ONLY.  IF CONFIRMATION IS NEEDED     FOR ANY PURPOSE, NOTIFY LAB     WITHIN 5 DAYS.                LOWEST DETECTABLE LIMITS     FOR URINE DRUG SCREEN     Drug Class       Cutoff (ng/mL)     Amphetamine      1000     Barbiturate      200     Benzodiazepine   242     Tricyclics       353     Opiates          300     Cocaine          300     THC              50  URINE MICROSCOPIC-ADD ON     Status: None   Collection Time    01/18/14  4:32 PM      Result Value Ref Range   Squamous Epithelial / LPF RARE  RARE   WBC, UA 0-2  <3 WBC/hpf   RBC / HPF 11-20  <3 RBC/hpf   Bacteria, UA RARE  RARE  CULTURE, BLOOD (ROUTINE X 2)     Status: None   Collection Time    01/18/14  4:49 PM       Result Value Ref Range   Specimen Description BLOOD ARM RIGHT  Special Requests BOTTLES DRAWN AEROBIC AND ANAEROBIC 5CC     Culture  Setup Time       Value: 01/18/2014 21:55     Performed at [facility]   Culture       Value: GRAM POSITIVE COCCI IN PAIRS     Note: Gram Stain Report Called to,Read Back By and Verified With: CHRIS A@ [id number] ON [id number] BY [facility]     Performed at [facility]   Report Status PENDING    Randolm Idol, ED     Status: Abnormal   Collection Time    01/18/14  5:07 PM      Result Value Ref Range   Troponin i, poc 0.63 (*) 0.00 - 0.08 ng/mL   Comment NOTIFIED PHYSICIAN     Comment 3            Comment: Due to the release kinetics of cTnI,     a negative result within the first hours     of the onset of symptoms does not rule out     myocardial infarction with certainty.     If myocardial infarction is still suspected,     repeat the test at appropriate intervals.  INFLUENZA PANEL BY PCR (TYPE A & B, H1N1)     Status: None   Collection Time    01/18/14  5:29 PM      Result Value Ref Range   Influenza A By PCR NEGATIVE  NEGATIVE   Influenza B By PCR NEGATIVE  NEGATIVE   H1N1 flu by pcr NOT DETECTED  NOT DETECTED   Comment:            The Xpert Flu assay (FDA approved for     nasal aspirates or washes and     nasopharyngeal swab specimens), is     intended as an aid in the diagnosis of     influenza and should not be used as     a sole basis for treatment.  AMMONIA     Status: None   Collection Time    01/18/14  7:39 PM      Result Value Ref Range   Ammonia 47  11 - 60 umol/L  ETHANOL     Status: None   Collection Time    01/18/14  7:43 PM      Result Value Ref Range   Alcohol, Ethyl (B) <11  0 - 11 mg/dL   Comment:            LOWEST DETECTABLE LIMIT FOR     SERUM ALCOHOL IS 11 mg/dL     FOR MEDICAL PURPOSES ONLY  PROTIME-INR     Status: Abnormal   Collection Time    01/18/14  7:43 PM      Result Value Ref Range    Prothrombin Time 15.3 (*) 11.6 - 15.2 seconds   INR 1.21  0.00 - 1.49  APTT     Status: None   Collection Time    01/18/14  7:43 PM      Result Value Ref Range   aPTT 37  24 - 37 seconds   Comment:            IF BASELINE aPTT IS ELEVATED,     SUGGEST PATIENT RISK ASSESSMENT     BE USED TO DETERMINE APPROPRIATE     ANTICOAGULANT THERAPY.  TSH     Status: None   Collection Time    01/18/14  7:45  PM      Result Value Ref Range   TSH 2.210  0.350 - 4.500 uIU/mL  I-STAT ARTERIAL BLOOD GAS, ED     Status: Abnormal   Collection Time    01/18/14  8:11 PM      Result Value Ref Range   pH, Arterial 7.474 (*) 7.350 - 7.450   pCO2 arterial 28.1 (*) 35.0 - 45.0 mmHg   pO2, Arterial 69.0 (*) 80.0 - 100.0 mmHg   Bicarbonate 20.6  20.0 - 24.0 mEq/L   TCO2 21  0 - 100 mmol/L   O2 Saturation 95.0     Acid-base deficit 2.0  0.0 - 2.0 mmol/L   Collection site RADIAL, ALLEN'S TEST ACCEPTABLE     Drawn by Operator     Sample type ARTERIAL    I-STAT CG4 LACTIC ACID, ED     Status: Abnormal   Collection Time    01/18/14  9:09 PM      Result Value Ref Range   Lactic Acid, Venous 3.59 (*) 0.5 - 2.2 mmol/L  CSF CELL COUNT WITH DIFFERENTIAL     Status: Abnormal   Collection Time    01/18/14  9:20 PM      Result Value Ref Range   Tube # 1     Color, CSF STRAW (*) COLORLESS   Appearance, CSF CLOUDY (*) CLEAR   Supernatant NOT INDICATED     RBC Count, CSF 695 (*) 0 /cu mm   WBC, CSF 7810 (*) 0 - 5 /cu mm   Comment: CRITICAL RESULT CALLED TO, READ BACK BY AND VERIFIED WITH:     A WEATHERFORD,RN [id number] 01/18/14 WBOND   Segmented Neutrophils-CSF 90 (*) 0 - 6 %   Lymphs, CSF 1 (*) 40 - 80 %   Monocyte-Macrophage-Spinal Fluid 9 (*) 15 - 45 %   Eosinophils, CSF 0  0 - 1 %   Other Cells, CSF 0    CSF CELL COUNT WITH DIFFERENTIAL     Status: Abnormal   Collection Time    01/18/14  9:20 PM      Result Value Ref Range   Tube # 4     Color, CSF STRAW (*) COLORLESS   Appearance, CSF CLOUDY (*) CLEAR    Supernatant NOT INDICATED     RBC Count, CSF 585 (*) 0 /cu mm   WBC, CSF 11321 (*) 0 - 5 /cu mm   Comment: CRITICAL RESULT CALLED TO, READ BACK BY AND VERIFIED WITH:     A WEATERFORD,RN 2257 01/18/14 WBOND   Segmented Neutrophils-CSF 92 (*) 0 - 6 %   Lymphs, CSF 1 (*) 40 - 80 %   Monocyte-Macrophage-Spinal Fluid 7 (*) 15 - 45 %   Eosinophils, CSF 0  0 - 1 %   Other Cells, CSF 0    CSF CULTURE     Status: None   Collection Time    01/18/14  9:20 PM      Result Value Ref Range   Specimen Description CSF     Special Requests NO 2 2CC     Gram Stain       Value: WBC PRESENT,BOTH PMN AND MONONUCLEAR     NO ORGANISMS SEEN     CYTOSPIN Performed at [facility]     Performed at [facility]   Culture       Value: NO GROWTH     Performed at [facility]   Report Status PENDING  GRAM STAIN     Status: None   Collection Time    01/18/14  9:20 PM      Result Value Ref Range   Specimen Description CSF     Special Requests NO 2 2CC     Gram Stain       Value: WBC PRESENT,BOTH PMN AND MONONUCLEAR     NO ORGANISMS SEEN     CYTOSPIN SLIDE   Report Status 01/18/2014 FINAL    GLUCOSE, CSF     Status: Abnormal   Collection Time    01/18/14  9:20 PM      Result Value Ref Range   Glucose, CSF <2 (*) 43 - 76 mg/dL   Comment: CRITICAL RESULT CALLED TO, READ BACK BY AND VERIFIED WITH:     A WEATHERFORD,RN [id number] [address]  PROTEIN, CSF     Status: Abnormal   Collection Time    01/18/14  9:20 PM      Result Value Ref Range   Total  Protein, CSF >600 (*) 15 - 45 mg/dL  MRSA PCR SCREENING     Status: None   Collection Time    01/18/14 10:18 PM      Result Value Ref Range   MRSA by PCR NEGATIVE  NEGATIVE   Comment:            The GeneXpert MRSA Assay (FDA     approved for NASAL specimens     only), is one component of a     comprehensive MRSA colonization     surveillance program. It is not     intended to diagnose MRSA     infection nor to guide or      monitor treatment for     MRSA infections.  TROPONIN I     Status: Abnormal   Collection Time    01/18/14 11:38 PM      Result Value Ref Range   Troponin I 2.83 (*) <0.30 ng/mL   Comment:            Due to the release kinetics of cTnI,     a negative result within the first hours     of the onset of symptoms does not rule out     myocardial infarction with certainty.     If myocardial infarction is still suspected,     repeat the test at appropriate intervals.     CRITICAL RESULT CALLED TO, READ BACK BY AND VERIFIED WITH:     Ollen Gross [id number] [address]  GLUCOSE, CAPILLARY     Status: Abnormal   Collection Time    01/19/14 12:24 AM      Result Value Ref Range   Glucose-Capillary 109 (*) 70 - 99 mg/dL   Comment 1 Capillary Sample    CBC     Status: Abnormal   Collection Time    01/19/14  2:53 AM      Result Value Ref Range   WBC 16.7 (*) 4.0 - 10.5 K/uL   RBC 4.65  3.87 - 5.11 MIL/uL   Hemoglobin 11.8 (*) 12.0 - 15.0 g/dL   HCT 37.2  36.0 - 46.0 %   MCV 80.0  78.0 - 100.0 fL   MCH 25.4 (*) 26.0 - 34.0 pg   MCHC 31.7  30.0 - 36.0 g/dL   RDW 12.7  11.5 - 15.5 %   Platelets 213  150 - 400 K/uL  BASIC METABOLIC PANEL     Status: Abnormal  Collection Time    01/19/14  2:53 AM      Result Value Ref Range   Sodium 139  137 - 147 mEq/L   Potassium 3.7  3.7 - 5.3 mEq/L   Chloride 105  96 - 112 mEq/L   Comment: DELTA CHECK NOTED   CO2 17 (*) 19 - 32 mEq/L   Glucose, Bld 122 (*) 70 - 99 mg/dL   BUN 16  6 - 23 mg/dL   Creatinine, Ser 0.67  0.50 - 1.10 mg/dL   Calcium 8.1 (*) 8.4 - 10.5 mg/dL   GFR calc non Af Amer >90  >90 mL/min   GFR calc Af Amer >90  >90 mL/min   Comment: (NOTE)     The eGFR has been calculated using the CKD EPI equation.     This calculation has not been validated in all clinical situations.     eGFR's persistently <90 mL/min signify possible Chronic Kidney     Disease.   Anion gap 17 (*) 5 - 15  TROPONIN I     Status: Abnormal   Collection Time      01/19/14  2:53 AM      Result Value Ref Range   Troponin I 3.27 (*) <0.30 ng/mL   Comment:            Due to the release kinetics of cTnI,     a negative result within the first hours     of the onset of symptoms does not rule out     myocardial infarction with certainty.     If myocardial infarction is still suspected,     repeat the test at appropriate intervals.     CRITICAL VALUE NOTED.  VALUE IS CONSISTENT WITH PREVIOUSLY REPORTED AND CALLED VALUE.  LACTIC ACID, PLASMA     Status: None   Collection Time    01/19/14  2:53 AM      Result Value Ref Range   Lactic Acid, Venous 1.5  0.5 - 2.2 mmol/L  HEMOGLOBIN A1C     Status: Abnormal   Collection Time    01/19/14  6:02 AM      Result Value Ref Range   Hemoglobin A1C 6.3 (*) <5.7 %   Comment: (NOTE)                                                                               According to the ADA Clinical Practice Recommendations for 2011, when     HbA1c is used as a screening test:      >=6.5%   Diagnostic of Diabetes Mellitus               (if abnormal result is confirmed)     5.7-6.4%   Increased risk of developing Diabetes Mellitus     References:Diagnosis and Classification of Diabetes Mellitus,Diabetes     BDZH,2992,42(ASTMH 1):S62-S69 and Standards of Medical Care in             Diabetes - 2011,Diabetes Care,2011,34 (Suppl 1):S11-S61.   Mean Plasma Glucose 134 (*) <117 mg/dL   Comment: Performed at [facility]: 9/18 blood cx GPC in prs 9/18 csf  pending IMAGING: Ct Head Wo Contrast  01/18/2014   CLINICAL DATA:  Indication a, acting inappropriately  EXAM: CT HEAD WITHOUT CONTRAST  TECHNIQUE: Contiguous axial images were obtained from the base of the skull through the vertex without intravenous contrast.  COMPARISON:  MRI brain 05/17/2013  FINDINGS: There is no evidence of mass effect, midline shift or extra-axial fluid collections. There is no evidence of a space-occupying lesion or intracranial  hemorrhage. There is no evidence of a cortical-based area of acute infarction. There is slight asymmetry of the cerebellar peduncles with the left cerebellar peduncle slightly lower in density relative to the right.  The ventricles and sulci are appropriate for the patient's age. The basal cisterns are patent.  Visualized portions of the orbits are unremarkable. The visualized portions of the paranasal sinuses and mastoid air cells are unremarkable.  The osseous structures are unremarkable.  IMPRESSION: There is slight asymmetry of the cerebellar peduncles with the left cerebellar peduncle slightly lower in density relative to the right. This may reflect normal variance versus an infarct. If there is further clinical concern recommend MRI.  Otherwise no acute intracranial pathology.   Electronically Signed   By: Kathreen Devoid   On: 01/18/2014 17:28   Dg Chest Port 1 View  01/18/2014   CLINICAL DATA:  Altered mental status  EXAM: PORTABLE CHEST - 1 VIEW  COMPARISON:  10/11/2012  FINDINGS: Hypoventilation with bibasilar atelectasis. Mild cardiac enlargement without heart failure or effusion.  IMPRESSION: Hypoventilation with mild bibasilar atelectasis.   Electronically Signed   By: Franchot Gallo M.D.   On: 01/18/2014 16:45    HISTORICAL MICRO/IMAGING  Assessment/Plan:  63yo F with HTN, HLD, depression presents with 1 day history of altered mental status, headache, with 1 week prodrome of URI symptoms. She was found to difficult to arouse/encephalopathic/with nuchal rigidity,with fever of 101.3. Albs show leukocytosis of 16.3, LA of 5.6, LP shows neutrophilic pleocytosis of WBC 11,300 with 92%N, elevated protein ,and low glucose concerning for bacterial meningitis. Also has evidence of bacteremia Blood cx showing GPC in pairs.  - continue on vancomycin, ceftriaxone and ampicillin for bacterial meningitis - will add steroids, although it is typically started prior to antibiotics. Since GPC in prs are found in  blood culture, concern that it may be due to strep pneumo related meningitis. We will continue steroids for now. - will repeat blood cx in 48hr to document clearance of bacteremia - await csf and blood culture results to narrow regimen - can discontinue acyclovir since this is likely bacterial meningitis rather than viral. - recommend 2 D echo, and may need to do TEE depending on further blood culture results - will recommend MRI if neuro exam shows focal deficits, concerning for stroke 2/2 septic emboli. It does not appear that she has visible deficit at this time.  Elzie [facility] [phone]

## 2014-01-19 NOTE — Progress Notes (Signed)
Pt admitted from ED to unit. Pt fidgety, restless, and unable to speak. BP in 160's. Pt on RA with O2 sats in 90's; HR in 100's. Pt continues to move around in bed. Will continue to monitor. Family is at bedside.

## 2014-01-19 NOTE — ED Provider Notes (Signed)
Medical screening examination/treatment/procedure(s) were conducted as a shared visit with non-physician practitioner(s) and myself.  I personally evaluated the patient during the encounter. Pt presents w/ AMS, meeting sepsis criteria due to fever, tachycardia, tachypnea. She is ill appearing, moaning. Airway protected.  GCS 4, 2, 5, moving all extremities equally. She appears agitated, is rolling around in the bed. Sister reports she was last seen nml about 9pm yesterday and had been complaining of flu-like symptoms recently, but had otherwise been well. Vanc/zosyn started empirically after code sepsis initiated.  No clear source of infection found on CXR or UA, but both LA, and WBC elevated.  CT head w/ possible infarct versus normal variant with asymmetry of cerebellar peduncles, however Dr. Roseanne Reno with neurology felt this was likely artifact and rec MRI when able.  Trop elevated, but repeat EKG w/o acute ischemic changes. I feel trop elevation secondary to sepsis.  PA Mathis Fare spoke w/ family medicine RE admission to stepdown, they requested LP which we considered to be difficult given agitation and inability to follow commands. IV acyclovir to be added. Initially meningitis thought to be low on differential given pt did not appears to have dec ROM of head/neck as she was failing about in the bed. I spoke w/ radiology about possibility for sedation and LP under fluoro, but radiology stating that would not be available until tomorrow morning. LP attempt supervised by myself was successful after sedation with ativan/fentanyl in ED several hours after ABx administration and revealed cloudy CSF concerning for meningitis.  Pt admitted to stepdown directly after LP. Second LA improved after 2L NS, however mental status unchanged.   1. Sepsis, due to unspecified organism   2. Altered mental status, unspecified altered mental status type   3. BMI 32.0-32.9,adult   4. Essential hypertension   5. Hyperlipemia   6.  Meningitis, bacterial       EKG Interpretation   Date/Time:  Friday January 18 2014 16:06:52 EDT Ventricular Rate:  114 PR Interval:  170 QRS Duration: 75 QT Interval:  337 QTC Calculation: 464 R Axis:   -4 Text Interpretation:  Sinus tachycardia Probable left atrial enlargement  Minimal ST depression, anterolateral leads ST elevation, consider inferior  injury in lead III Baseline wander in lead(s) III aVF V6 Confirmed by  Cecille Mcclusky  MD, Jonice Cerra ([id number]) on 01/18/2014 4:13:10 PM        Toy Cookey, MD 01/19/14 1041

## 2014-01-20 DIAGNOSIS — G039 Meningitis, unspecified: Secondary | ICD-10-CM

## 2014-01-20 DIAGNOSIS — R7881 Bacteremia: Secondary | ICD-10-CM

## 2014-01-20 LAB — DIFFERENTIAL
BASOS ABS: 0 10*3/uL (ref 0.0–0.1)
BASOS PCT: 0 % (ref 0–1)
Eosinophils Absolute: 0 10*3/uL (ref 0.0–0.7)
Eosinophils Relative: 0 % (ref 0–5)
Lymphocytes Relative: 4 % — ABNORMAL LOW (ref 12–46)
Lymphs Abs: 0.8 10*3/uL (ref 0.7–4.0)
MONO ABS: 0.8 10*3/uL (ref 0.1–1.0)
Monocytes Relative: 4 % (ref 3–12)
NEUTROS ABS: 17 10*3/uL — AB (ref 1.7–7.7)
Neutrophils Relative %: 92 % — ABNORMAL HIGH (ref 43–77)

## 2014-01-20 LAB — BASIC METABOLIC PANEL
Anion gap: 15 (ref 5–15)
BUN: 16 mg/dL (ref 6–23)
CALCIUM: 8.5 mg/dL (ref 8.4–10.5)
CO2: 21 mEq/L (ref 19–32)
Chloride: 107 mEq/L (ref 96–112)
Creatinine, Ser: 0.74 mg/dL (ref 0.50–1.10)
GFR calc Af Amer: >90 mL/min (ref >90)
GFR, EST NON AFRICAN AMERICAN: 89 mL/min — AB (ref >90)
GLUCOSE: 118 mg/dL — AB (ref 70–99)
Potassium: 3.4 mEq/L — ABNORMAL LOW (ref 3.7–5.3)
SODIUM: 143 meq/L (ref 137–147)

## 2014-01-20 LAB — CBC
HCT: 35.5 % — ABNORMAL LOW (ref 36.0–46.0)
HEMOGLOBIN: 11.4 g/dL — AB (ref 12.0–15.0)
MCH: 25.8 pg — AB (ref 26.0–34.0)
MCHC: 32.1 g/dL (ref 30.0–36.0)
MCV: 80.3 fL (ref 78.0–100.0)
Platelets: 206 10*3/uL (ref 150–400)
RBC: 4.42 MIL/uL (ref 3.87–5.11)
RDW: 12.9 % (ref 11.5–15.5)
WBC: 17.9 10*3/uL — ABNORMAL HIGH (ref 4.0–10.5)

## 2014-01-20 LAB — TROPONIN I: Troponin I: 1.52 ng/mL (ref <0.30)

## 2014-01-20 LAB — VANCOMYCIN, TROUGH: Vancomycin Tr: 8 ug/mL — ABNORMAL LOW (ref 10.0–20.0)

## 2014-01-20 MED ORDER — VANCOMYCIN HCL IN DEXTROSE 1-5 GM/200ML-% IV SOLN
1000.0000 mg | Freq: Three times a day (TID) | INTRAVENOUS | Status: DC
  Administered 2014-01-21 (×2): 1000 mg via INTRAVENOUS
  Filled 2014-01-20 (×3): qty 200

## 2014-01-20 MED ORDER — VANCOMYCIN HCL IN DEXTROSE 1-5 GM/200ML-% IV SOLN
1000.0000 mg | INTRAVENOUS | Status: AC
  Administered 2014-01-20: 1000 mg via INTRAVENOUS
  Filled 2014-01-20: qty 200

## 2014-01-20 NOTE — Progress Notes (Signed)
FMTS ATTENDING  NOTE [name],MD I  have seen and examined this patient, reviewed their chart. I have discussed this patient with the resident. I agree with the resident's findings, assessment and care plan. 

## 2014-01-20 NOTE — Progress Notes (Addendum)
[facility]    Date of Admission:  01/18/2014   Total days of antibiotics 3        Day 3 vanco        Day 2 ctx        Day 2 amp   ID: [name] [name] is a 63 y.o. female presents with sepsis from bacteremia and meningitis  Active Problems:   Sepsis   Altered mental status    Subjective: Afebrile, but remains encephalopathic, now intermittently opens eyes recognizes family  Medications:  . ampicillin (OMNIPEN) IV  2 g Intravenous 6 times per day  . antiseptic oral rinse  7 mL Mouth Rinse BID  . cefTRIAXone (ROCEPHIN)  IV  2 g Intravenous Q12H  . dexamethasone  10 mg Intravenous 4 times per day  . heparin  5,000 Units Subcutaneous 3 times per day  . vancomycin  1,000 mg Intravenous Q12H    Objective: Vital signs in last 24 hours: Temp:  [97.4 F (36.3 C)-100.3 F (37.9 C)] 97.4 F (36.3 C) (09/20 0711) Pulse Rate:  [87-104] 87 (09/20 0711) Resp:  [18-20] 18 (09/20 0404) BP: (137-165)/(55-92) 137/75 mmHg (09/20 0711) SpO2:  [97 %-100 %] 99 % (09/20 0711)  Physical Exam  Constitutional:   appears well-developed and well-nourished. No distress. Does not follow commands consistently HENT:  Neck: + mild nuchal rigiditiy Cardiovascular: Normal rate, regular rhythm and normal heart sounds. Exam reveals no gallop and no friction rub.  No murmur heard.  Pulmonary/Chest: Effort normal and breath sounds normal. No respiratory distress.  has no wheezes.  Abdominal: Soft. Bowel sounds are decreased.  exhibits no distension. There is no tenderness.  Lymphadenopathy: no cervical adenopathy.   Skin: Skin is warm and dry. No rash noted. No erythema. echymosis to left dorsum of hand   Lab Results  Recent Labs  01/19/14 0253 01/20/14 0335  WBC 16.7* 17.9*  HGB 11.8* 11.4*  HCT 37.2 35.5*  NA 139 143  K 3.7 3.4*  CL 105 107  CO2 17* 21  BUN 16 16  CREATININE 0.67 0.74   Liver Panel  Recent Labs  01/18/14 1615  PROT 8.6*  ALBUMIN 3.9   AST 33  ALT 25  ALKPHOS 105  BILITOT 0.7    Microbiology: 9/18 blood cx + strep species - alpha strep family per discussion with micro lab 9/19 urine - + strep pneumoniae antigen 9/18 csf cx still NGTD in 36hr. 9/18 hsv pcr on CSF NEGATIVE  Studies/Results: Ct Head Wo Contrast  01/18/2014   CLINICAL DATA:  Indication a, acting inappropriately  EXAM: CT HEAD WITHOUT CONTRAST  TECHNIQUE: Contiguous axial images were obtained from the base of the skull through the vertex without intravenous contrast.  COMPARISON:  MRI brain 05/17/2013  FINDINGS: There is no evidence of mass effect, midline shift or extra-axial fluid collections. There is no evidence of a space-occupying lesion or intracranial hemorrhage. There is no evidence of a cortical-based area of acute infarction. There is slight asymmetry of the cerebellar peduncles with the left cerebellar peduncle slightly lower in density relative to the right.  The ventricles and sulci are appropriate for the patient's age. The basal cisterns are patent.  Visualized portions of the orbits are unremarkable. The visualized portions of the paranasal sinuses and mastoid air cells are unremarkable.  The osseous structures are unremarkable.  IMPRESSION: There is slight asymmetry of the cerebellar peduncles with the left cerebellar peduncle slightly lower in density relative  to the right. This may reflect normal variance versus an infarct. If there is further clinical concern recommend MRI.  Otherwise no acute intracranial pathology.   Electronically Signed   By: Elige Ko   On: 01/18/2014 17:28   Dg Chest Port 1 View  01/18/2014   CLINICAL DATA:  Altered mental status  EXAM: PORTABLE CHEST - 1 VIEW  COMPARISON:  10/11/2012  FINDINGS: Hypoventilation with bibasilar atelectasis. Mild cardiac enlargement without heart failure or effusion.  IMPRESSION: Hypoventilation with mild bibasilar atelectasis.   Electronically Signed   By: Marlan Palau M.D.   On:  01/18/2014 16:45    Assessment/Plan: Bacteremia = per discussion with microbiologist appears to be c/w strep pneumo which would clinically make sense with the patients prodrome of uri, and her urine antigen being positive. Will continue with vanco and ceftriaxone for now and discontinue ampicillin. Will repeat blood cultures today. 2 D echo suggest no murmurs/vegetations  Meningitis = likely 2nd process, still no growth on culture at 36hr. Will discontinue ampicillin since it is not likely to be listeria. Continue with steroids for a total of 4 days, currently on day 2. Continue on vancomycin and ceftriaxone until we have identification/sensitivities from blood  And csf cultures. This is not meningococcal meningitis, so staff do not need prophylaxis. Still encephalopathic. Would recommend keeping in step down and reassess in the morning. Can discontinue respiratory precautions.  Demand ischemia/nstemi = will add troponin to labs to see if she has peaked  [facility], [facility] for Infectious Diseases Cell: [phone] Pager: [phone]  01/20/2014, 10:46 AM

## 2014-01-20 NOTE — Progress Notes (Signed)
ANTIBIOTIC CONSULT NOTE - FOLLOW UP  Pharmacy Consult for vancomycin Indication: sepsis and meningitis  No Known Allergies  Patient Measurements: Height: 5' 6.53" (169 cm) Weight: 242 lb 9 oz (110.026 kg) IBW/kg (Calculated) : 60.53  Vital Signs: Temp: 98 F (36.7 C) (09/20 1707) Temp src: Oral (09/20 1707) BP: 146/103 mmHg (09/20 1707) Pulse Rate: 88 (09/20 1707) Intake/Output from previous day: 09/19 0701 - 09/20 0700 In: 2425 [I.V.:1725; IV Piggyback:700] Out: 2475 [Urine:2475] Intake/Output from this shift: Total I/O In: 900 [I.V.:800; IV Piggyback:100] Out: 625 [Urine:625]  Labs:  Recent Labs  01/18/14 1615 01/19/14 0253 01/20/14 0335  WBC 13.9* 16.7* 17.9*  HGB 13.6 11.8* 11.4*  PLT 232 213 206  CREATININE 0.94 0.67 0.74   Estimated Creatinine Clearance: 92.4 ml/min (by C-G formula based on Cr of 0.74).  Recent Labs  01/20/14 1626  VANCOTROUGH 8.0*     Microbiology: Recent Results (from the past 720 hour(s))  URINE CULTURE     Status: None   Collection Time    01/18/14  4:32 PM      Result Value Ref Range Status   Specimen Description URINE, CATHETERIZED   Final   Special Requests NONE   Final   Culture  Setup Time     Final   Value: 01/18/2014 17:43     Performed at [facility] Count     Final   Value: NO GROWTH     Performed at [facility]   Culture     Final   Value: NO GROWTH     Performed at [facility]   Report Status 01/19/2014 FINAL   Final  CULTURE, BLOOD (ROUTINE X 2)     Status: None   Collection Time    01/18/14  4:49 PM      Result Value Ref Range Status   Specimen Description BLOOD ARM RIGHT   Final   Special Requests BOTTLES DRAWN AEROBIC AND ANAEROBIC 5CC   Final   Culture  Setup Time     Final   Value: 01/18/2014 21:55     Performed at [facility]   Culture     Final   Value: STREPTOCOCCUS SPECIES     Note: Gram Stain Report Called to,Read Back By and Verified With: CHRIS A@  1034 ON [id number] BY [facility]     Performed at [facility]   Report Status PENDING   Incomplete  CULTURE, BLOOD (ROUTINE X 2)     Status: None   Collection Time    01/18/14  5:01 PM      Result Value Ref Range Status   Specimen Description BLOOD HAND RIGHT   Final   Special Requests BOTTLES DRAWN AEROBIC AND ANAEROBIC 4CC   Final   Culture  Setup Time     Final   Value: 01/18/2014 21:57     Performed at [facility]   Culture     Final   Value: STREPTOCOCCUS SPECIES     Note: Gram Stain Report Called to,Read Back By and Verified With: CHRIS A@ 1034 ON [id number] BY [facility]     Performed at [facility]   Report Status PENDING   Incomplete  CSF CULTURE     Status: None   Collection Time    01/18/14  9:20 PM      Result Value Ref Range Status   Specimen Description CSF   Final   Special Requests NO 2 2CC  Final   Gram Stain     Final   Value: WBC PRESENT,BOTH PMN AND MONONUCLEAR     NO ORGANISMS SEEN     CYTOSPIN Performed at [facility]     Performed at [facility]   Culture     Final   Value: NO GROWTH 2 DAYS     Performed at [facility]   Report Status PENDING   Incomplete  GRAM STAIN     Status: None   Collection Time    01/18/14  9:20 PM      Result Value Ref Range Status   Specimen Description CSF   Final   Special Requests NO 2 2CC   Final   Gram Stain     Final   Value: WBC PRESENT,BOTH PMN AND MONONUCLEAR     NO ORGANISMS SEEN     CYTOSPIN SLIDE   Report Status 01/18/2014 FINAL   Final  MRSA PCR SCREENING     Status: None   Collection Time    01/18/14 10:18 PM      Result Value Ref Range Status   MRSA by PCR NEGATIVE  NEGATIVE Final   Comment:            The GeneXpert MRSA Assay (FDA     approved for NASAL specimens     only), is one component of a     comprehensive MRSA colonization     surveillance program. It is not     intended to diagnose MRSA     infection nor to guide or     monitor treatment for     MRSA  infections.    Anti-infectives   Start     Dose/Rate Route Frequency Ordered Stop   01/19/14 1115  ampicillin (OMNIPEN) 2 g in sodium chloride 0.9 % 50 mL IVPB  Status:  Discontinued     2 g 150 mL/hr over 20 Minutes Intravenous 6 times per day 01/19/14 1107 01/20/14 1056   01/19/14 1000  cefTRIAXone (ROCEPHIN) 2 g in dextrose 5 % 50 mL IVPB     2 g 100 mL/hr over 30 Minutes Intravenous Every 12 hours 01/19/14 0827     01/19/14 0830  ampicillin (OMNIPEN) 2 g in sodium chloride 0.9 % 50 mL IVPB  Status:  Discontinued     2 g 150 mL/hr over 20 Minutes Intravenous 6 times per day 01/19/14 0828 01/19/14 1107   01/19/14 0500  vancomycin (VANCOCIN) IVPB 1000 mg/200 mL premix     1,000 mg 200 mL/hr over 60 Minutes Intravenous Every 12 hours 01/18/14 1627     01/19/14 0000  piperacillin-tazobactam (ZOSYN) IVPB 3.375 g  Status:  Discontinued     3.375 g 12.5 mL/hr over 240 Minutes Intravenous Every 8 hours 01/18/14 1627 01/19/14 0827   01/18/14 2100  acyclovir (ZOVIRAX) 605 mg in dextrose 5 % 100 mL IVPB  Status:  Discontinued     10 mg/kg  60.5 kg (Ideal) 112.1 mL/hr over 60 Minutes Intravenous 3 times per day 01/18/14 2000 01/19/14 0911   01/18/14 1630  piperacillin-tazobactam (ZOSYN) IVPB 3.375 g     3.375 g 100 mL/hr over 30 Minutes Intravenous  Once 01/18/14 1616 01/18/14 1815   01/18/14 1630  vancomycin (VANCOCIN) IVPB 1000 mg/200 mL premix     1,000 mg 200 mL/hr over 60 Minutes Intravenous  Once 01/18/14 1616 01/18/14 2050      Assessment: 63 y/o female on day 2 vancomycin and ceftriaxone  for presumed Strep pneumo bacteremia and meningitis. Strep species growing in blodd cultures. Vancomycin trough is subtherapeutic at 8. Renal function is stable.  Goal of Therapy:  Vancomycin trough level 15-20 mcg/ml Eradication of infection  Plan:  - Increase vancomycin to 1 g IV q8h - Monitor renal function and culture data  [facility], Pharm.D., BCPS Clinical Pharmacist Pager:  [phone] 01/20/2014 6:08 PM

## 2014-01-20 NOTE — Consult Note (Signed)
CONSULT NOTE  Date: 01/20/2014               Patient Name:  [name] [name] MRN: [id number]  DOB: Mar 15, 1951 Age / Sex: 63 y.o., female        PCP: Tonye Pearson Primary Cardiologist: Clifton James            Referring Physician: Lum Babe              Reason for Consult: + Troponin            History of Present Illness: Patient is a 63 y.o. female with a PMHx of hypertension, hyperlipidemia, history of cigarette smoking, who was admitted to [facility] on 01/18/2014 for evaluation of altered mental status. She was diagnosed as having meningitis.  She was found to have a positive troponin level and we were asked to see her in consultation.  No history was obtainable from the patient . I was able to talk with her daughter. She's not had any episodes of chest pain or shortness of breath. She has a headache and a bad cold for the past week.    She reported had some chest pain back in August, 2014. A stress Myoview study was negative for ischemia.  She had  normal left ventricular systolic function with an ejection fraction of 74%.  Echo yesterday shows EF 55-60%.   She remains minimally responsive  Medications: Outpatient medications: Prescriptions prior to admission  Medication Sig Dispense Refill  . aspirin 81 MG chewable tablet Chew 81 mg by mouth daily.      . diphenhydramine-acetaminophen (TYLENOL PM) 25-500 MG TABS Take 2 tablets by mouth at bedtime as needed (for sleep).      Marland Kitchen escitalopram (LEXAPRO) 10 MG tablet Take 10 mg by mouth daily.      Marland Kitchen lisinopril (PRINIVIL,ZESTRIL) 20 MG tablet Take 20 mg by mouth daily.      . Multiple Vitamins-Minerals (MULTIVITAMIN PO) Take 1 tablet by mouth daily.      . simvastatin (ZOCOR) 20 MG tablet Take 20 mg by mouth daily.        Current medications: Current Facility-Administered Medications  Medication Dose Route Frequency Provider Last Rate Last Dose  . 0.9 %  sodium chloride infusion   Intravenous Continuous Tommie Sams, DO 100  mL/hr at 01/19/14 2000    . acetaminophen (TYLENOL) tablet 650 mg  650 mg Oral Q6H PRN Myra Rude, MD       Or  . acetaminophen (TYLENOL) suppository 650 mg  650 mg Rectal Q6H PRN Myra Rude, MD      . ampicillin (OMNIPEN) 2 g in sodium chloride 0.9 % 50 mL IVPB  2 g Intravenous 6 times per day Margie Billet, RPH   2 g at 01/20/14 4098  . antiseptic oral rinse (CPC / CETYLPYRIDINIUM CHLORIDE 0.05%) solution 7 mL  7 mL Mouth Rinse BID Myra Rude, MD   7 mL at 01/20/14 1000  . cefTRIAXone (ROCEPHIN) 2 g in dextrose 5 % 50 mL IVPB  2 g Intravenous Q12H Jayce G Cook, DO   2 g at 01/20/14 1032  . dexamethasone (DECADRON) injection 10 mg  10 mg Intravenous 4 times per day Judyann Munson, MD   10 mg at 01/20/14 0608  . heparin injection 5,000 Units  5,000 Units Subcutaneous 3 times per day Myra Rude, MD   5,000 Units at 01/20/14 [phone]  . LORazepam (ATIVAN) injection 1  mg  1 mg Intravenous Q6H PRN Tommie Sams, DO   1 mg at 01/20/14 0431  . morphine 2 MG/ML injection 1 mg  1 mg Intravenous Q2H PRN Tommie Sams, DO   1 mg at 01/20/14 0432  . vancomycin (VANCOCIN) IVPB 1000 mg/200 mL premix  1,000 mg Intravenous Q12H Toy Cookey, MD   1,000 mg at 01/20/14 0431     No Known Allergies   Past Medical History  Diagnosis Date  . Cataract   . Depression   . Anxiety   . HTN (hypertension)   . Hyperlipemia     Past Surgical History  Procedure Laterality Date  . Cyst removal bilateral wrists      Family History  Problem Relation Age of Onset  . Heart attack Mother 25  . Stroke Mother   . Hypertension Mother   . Heart attack Father 72    Social History:  reports that she quit smoking about 2 years ago. Her smoking use included Cigarettes. She has a 20 pack-year smoking history. She does not have any smokeless tobacco history on file. She reports that she does not drink alcohol or use illicit drugs.   Physical Exam: BP 137/75  Pulse 87  Temp(Src) 97.4 F (36.3  C) (Oral)  Resp 18  Ht 5' 6.53" (1.69 m)  Wt 242 lb 9 oz (110.026 kg)  BMI 38.52 kg/m2  SpO2 99%  Wt Readings from Last 3 Encounters:  01/18/14 242 lb 9 oz (110.026 kg)  10/17/13 238 lb (107.956 kg)  05/22/13 236 lb (107.049 kg)    General: Vital signs reviewed and noted. Somulent/ lethargic   Head: Normocephalic, atraumatic, sclera anicteric,   Neck: Supple. Negative for carotid bruits. No JVD   Lungs:  Clear bilaterally, no  wheezes, rales, or rhonchi. Breathing is normal   Heart: RRR with S1 S2. No murmurs, rubs, or gallops   Abdomen:  Soft, non-tender, non-distended with normoactive bowel sounds. No hepatomegaly. No rebound/guarding. No obvious abdominal masses   MSK: Strength and the appear normal for age.   Extremities: No clubbing or cyanosis. No edema.  Distal pedal pulses are 2+ and equal   Neurologic: Lethargic, minimally responsive   Psych: NA    Lab results: Basic Metabolic Panel:  Recent Labs Lab 01/18/14 1615 01/19/14 0253 01/20/14 0335  NA 138 139 143  K 3.3* 3.7 3.4*  CL 95* 105 107  CO2 25 17* 21  GLUCOSE 185* 122* 118*  BUN CREATININE 0.94 0.67 0.74  CALCIUM 9.6 8.1* 8.5    Liver Function Tests:  Recent Labs Lab 01/18/14 1615  AST 33  ALT 25  ALKPHOS 105  BILITOT 0.7  PROT 8.6*  ALBUMIN 3.9   No results found for this basename: LIPASE, AMYLASE,  in the last 168 hours  Recent Labs Lab 01/18/14 1939  AMMONIA 47    CBC:  Recent Labs Lab 01/18/14 1615 01/19/14 0253 01/20/14 0335  WBC 13.9* 16.7* 17.9*  NEUTROABS 12.8*  --   --   HGB 13.6 11.8* 11.4*  HCT 43.2 37.2 35.5*  MCV 82.1 80.0 80.3  PLT 232 213 206    Cardiac Enzymes:  Recent Labs Lab 01/18/14 2338 01/19/14 0253 01/19/14 1320  TROPONINI 2.83* 3.27* 5.31*    BNP: No components found with this basename: POCBNP,   CBG:  Recent Labs Lab 01/19/14 0024  GLUCAP 109*    Coagulation Studies:  Recent Labs  01/18/14 1943  LABPROT 15.3*  INR  1.21     Other results:  EKG: Normal sinus rhythm at 97. She has nonspecific ST-T wave changes.   Imaging: Ct Head Wo Contrast  01/18/2014   CLINICAL DATA:  Indication a, acting inappropriately  EXAM: CT HEAD WITHOUT CONTRAST  TECHNIQUE: Contiguous axial images were obtained from the base of the skull through the vertex without intravenous contrast.  COMPARISON:  MRI brain 05/17/2013  FINDINGS: There is no evidence of mass effect, midline shift or extra-axial fluid collections. There is no evidence of a space-occupying lesion or intracranial hemorrhage. There is no evidence of a cortical-based area of acute infarction. There is slight asymmetry of the cerebellar peduncles with the left cerebellar peduncle slightly lower in density relative to the right.  The ventricles and sulci are appropriate for the patient's age. The basal cisterns are patent.  Visualized portions of the orbits are unremarkable. The visualized portions of the paranasal sinuses and mastoid air cells are unremarkable.  The osseous structures are unremarkable.  IMPRESSION: There is slight asymmetry of the cerebellar peduncles with the left cerebellar peduncle slightly lower in density relative to the right. This may reflect normal variance versus an infarct. If there is further clinical concern recommend MRI.  Otherwise no acute intracranial pathology.   Electronically Signed   By: Elige Ko   On: 01/18/2014 17:28   Dg Chest Port 1 View  01/18/2014   CLINICAL DATA:  Altered mental status  EXAM: PORTABLE CHEST - 1 VIEW  COMPARISON:  10/11/2012  FINDINGS: Hypoventilation with bibasilar atelectasis. Mild cardiac enlargement without heart failure or effusion.  IMPRESSION: Hypoventilation with mild bibasilar atelectasis.   Electronically Signed   By: Marlan Palau M.D.   On: 01/18/2014 16:45     Assessment & Plan:  1. Positive troponin levels:   patient has a history of cigarette smoking in the past. She also has a history of  hypertension and hyperlipidemia. She denies any episodes of angina. She had a negative Myoview study in August, 2014. I suspect that her troponin elevation is do to demand ischemia.  At this point she still has severely altered mental status it is not a candidate for catheterization. Her EKG appears to be nonacute.  When she recovers, we can decide whether she needs a repeat Myoview study or needs to have a heart catheterization.   He echo reveals normal LV function.  No segmental wall motion abnormalities. The EF is lower than the previous evaluation done by [facility] but I do not think we can make anything significant from this change because it was done with a different techniques.    She needs to make significant improvement before we can decide how aggressive to be in our evaluation of her coronary status.    2. Hypertension: The patient's blood pressure is fairly well controlled.  3. Meningitis:  Blood cultures show GPC in pairs.  CSF is no growth yet.  Plans per medicine service.     Vesta Mixer, Montez Hageman., MD, [facility] 01/20/2014, 10:48 AM Office - [phone] Pager [phone]

## 2014-01-20 NOTE — Progress Notes (Signed)
Family Medicine Teaching Service Daily Progress Note Intern Pager: [phone]  Patient name: [name] [name] Medical record number: [id number] Date of birth: 1950/10/13 Age: 63 y.o. Gender: female  Primary Care Provider: Tonye Pearson, MD Consultants: Cardiology, Neurology, ID Code Status: Full Code  Pt Overview and Major Events to Date:  9/18 - Admitted for suspected meningitis 9/19 - Coverage changed to Vanc/CTX/Amp, ID consulted  Assessment and Plan: [name] [name] is a 63 y.o. female presenting with altered mental status suspected due to meningitis. PMH is significant for depression, anxiety, hypertension and hyperlipidemia.   #Acute encephalopathy secondary to Sepsis from meningitis - Work up consistent with bacterial meningitis (CSF - >600 protein, <2 glucose, WBC 11321 (90% neutrophils).  - Initially covered with Vanc/Zosyn/Acyclovir.  Changed to Vanc/CTX/Ampicillin on 9/19.  Ampicillin discontinued on 9/20.  ID consulted and following recs.  - Vanc (9/18 >>>)  - CTX (9/19 >>>)  - Ampicillin (9/19-9/20)  - Zosyn (9/18 - 9/19)  - Acyclovir (9/18 - 9/19) - Steroids started on 9/19  -Day #2 of 4 day regimen -Micro:  - Blood Cx:  Gram Positive Cocci in Pairs, concerning for Strep. pneumo   -Repeat Blood Cx today   -Awaiting sensitivities to narrow abx regimen  - Urine Cx- no growth to date  - CSF Cx- WBC present both PMN and mononuclear; no organisms seen  - RPR- nonreactive  - HIV- nonreactive  - Strep Pneumo Urinary Antigen- Postive  - HSV- none detected  - Influenza- negative - Will continue neuro checks - Echo showed no signs of murmurs or vegetations - Will need MRI during hospitalization (pending clinical improvement)  #Elevated Troponin - Likely secondary to demand ischemia in setting of Sepsis - Troponin trending down--1.52 today down from 5.31 yesterday  - Negative Myoview study in August 2014 - Cardiology consulted and appreciate recs  -Consider repeat  Myoview vs. Catheterization pending recovery from acute illness  -Echo to determine LV function: EF 55-60%; abnormal left ventricular relaxation (grade 1 diastolic dysfunction).  #HTN - BP stable this am - Holding home ACEI  #HLD:  On simvastatin 20 mg daily  - Holding in setting of critical illness and NPO status.  #Depression/Anxiety: holding all home (lexapro 10 mg) medications.   FEN/GI: NPO, NS @ 100 mL/hr PPx: Heparin  Disposition: Stepdown. Patient ill appearing. Continuing Abx and will monitor closely for improvement.   Subjective:  No acute complaints overnight.  Family states she has woken up several times and asked where she was and why she was at the hospital.  Family states she seems to open her left eye wider and while she is moving both arms, she seems to be able to squeeze better with the left.  No further complaints.  Objective: Temp:  [97.4 F (36.3 C)-100.3 F (37.9 C)] 97.4 F (36.3 C) (09/20 0711) Pulse Rate:  [41-104] 87 (09/20 0711) Resp:  [18-20] 18 (09/20 0404) BP: (120-165)/(55-92) 137/75 mmHg (09/20 0711) SpO2:  [16 %-100 %] 99 % (09/20 0711)  Physical Exam: General: 62yo female resting comfortably; arousable; nonverbal HEENT: PERRLA. Dry mucous membranes. Cardiovascular: S1 and S2 noted. No murmurs noted. Regular rhythm. Tachycardic.  Respiratory: Appears clear, but difficult to auscultate secondary to snoring. No increased work of breathing noted. Abdomen: Bowel sounds noted. Soft and non-distended. No tenderness or masses to palpation. Extremities: Warm and well perfused. No edema noted.  Neuro: PEERLA. Moves all extremities freely. Not following commands. Normal Babinski's.  Laboratory:  Recent Labs Lab 01/18/14 1615  01/19/14 0253 01/20/14 0335  WBC 13.9* 16.7* 17.9*  HGB 13.6 11.8* 11.4*  HCT 43.2 37.2 35.5*  PLT 232 213 206    Recent Labs Lab 01/18/14 1615 01/19/14 0253 01/20/14 0335  NA 138 139 143  K 3.3* 3.7 3.4*  CL 95*  105 107  CO2 25 17* 21  BUN CREATININE 0.94 0.67 0.74  CALCIUM 9.6 8.1* 8.5  PROT 8.6*  --   --   BILITOT 0.7  --   --   ALKPHOS 105  --   --   ALT 25  --   --   AST 33  --   --   GLUCOSE 185* 122* 118*   POC Troponin - 0.63  Cardiac Panel (last 3 results)  Recent Labs  01/18/14 2338 01/19/14 0253 01/19/14 1320  TROPONINI 2.83* 3.27* 5.31*   Lactic acid - 3.27  CSF - >600 protein, <2 glucose, WBC 11321 (90% neutrophils).  Micro: Blood cx (9/18) CSF cx (9/18) Urine cx (9/18) RPR- nonreactive HIV- nonreactive Strep Pneumo urinary antigen- positive  Hemoglobin A1C- 6.3  Imaging/Diagnostic Tests:  Ct Head Wo Contrast 01/18/2014    IMPRESSION: There is slight asymmetry of the cerebellar peduncles with the left cerebellar peduncle slightly lower in density relative to the right. This may reflect normal variance versus an infarct. If there is further clinical concern recommend MRI.  Otherwise no acute intracranial pathology.    In ED, findings were discussed with Neurologist Dr. Roseanne Reno.  He felt that this was artifact. Recommended MRI.  Dg Chest Port 1 View 01/18/2014   CLINICAL DATA:  Altered mental status  EXAM: PORTABLE CHEST - 1 VIEW  COMPARISON:  10/11/2012  FINDINGS: Hypoventilation with bibasilar atelectasis. Mild cardiac enlargement without heart failure or effusion.  IMPRESSION: Hypoventilation with mild bibasilar atelectasis.   Echo- Left ventricle: EF 55-60%; abnormal left ventricular relaxation (grade 1 diastolic dysfunction).  [address] [place], [place] 01/20/2014, 7:22 AM PGY-1, [facility] Family Medicine FPTS Intern pager: [phone], text pages welcome

## 2014-01-21 ENCOUNTER — Inpatient Hospital Stay (HOSPITAL_COMMUNITY): Payer: Federal, State, Local not specified - PPO

## 2014-01-21 ENCOUNTER — Encounter: Payer: Federal, State, Local not specified - PPO | Admitting: Cardiovascular Disease

## 2014-01-21 DIAGNOSIS — A491 Streptococcal infection, unspecified site: Secondary | ICD-10-CM

## 2014-01-21 DIAGNOSIS — G009 Bacterial meningitis, unspecified: Secondary | ICD-10-CM

## 2014-01-21 DIAGNOSIS — A419 Sepsis, unspecified organism: Secondary | ICD-10-CM

## 2014-01-21 LAB — CULTURE, BLOOD (ROUTINE X 2)

## 2014-01-21 LAB — BASIC METABOLIC PANEL
ANION GAP: 14 (ref 5–15)
BUN: 25 mg/dL — ABNORMAL HIGH (ref 6–23)
CALCIUM: 8.4 mg/dL (ref 8.4–10.5)
CO2: 20 mEq/L (ref 19–32)
Chloride: 105 mEq/L (ref 96–112)
Creatinine, Ser: 0.66 mg/dL (ref 0.50–1.10)
GFR calc Af Amer: >90 mL/min (ref >90)
GFR calc non Af Amer: >90 mL/min (ref >90)
Glucose, Bld: 153 mg/dL — ABNORMAL HIGH (ref 70–99)
Potassium: 3.8 mEq/L (ref 3.7–5.3)
Sodium: 139 mEq/L (ref 137–147)

## 2014-01-21 LAB — CBC
HCT: 35.7 % — ABNORMAL LOW (ref 36.0–46.0)
Hemoglobin: 11.4 g/dL — ABNORMAL LOW (ref 12.0–15.0)
MCH: 25.4 pg — ABNORMAL LOW (ref 26.0–34.0)
MCHC: 31.9 g/dL (ref 30.0–36.0)
MCV: 79.7 fL (ref 78.0–100.0)
PLATELETS: 249 10*3/uL (ref 150–400)
RBC: 4.48 MIL/uL (ref 3.87–5.11)
RDW: 13 % (ref 11.5–15.5)
WBC: 15.4 10*3/uL — AB (ref 4.0–10.5)

## 2014-01-21 LAB — GLUCOSE, CAPILLARY: Glucose-Capillary: 188 mg/dL — ABNORMAL HIGH (ref 70–99)

## 2014-01-21 MED ORDER — ASPIRIN 300 MG RE SUPP
300.0000 mg | Freq: Every day | RECTAL | Status: DC
  Filled 2014-01-21: qty 1

## 2014-01-21 MED ORDER — ASPIRIN 81 MG PO CHEW
81.0000 mg | CHEWABLE_TABLET | Freq: Every day | ORAL | Status: DC
  Administered 2014-01-21 – 2014-01-29 (×9): 81 mg via ORAL
  Filled 2014-01-21 (×9): qty 1

## 2014-01-21 NOTE — Progress Notes (Signed)
CONSULT NOTE  Date: 01/21/2014               Patient Name:  [name] [name] MRN: [id number]  DOB: Dec 09, 1950 Age / Sex: 63 y.o., female        PCP: Tonye Pearson Primary Cardiologist: Clifton James            Referring Physician: Lum Babe              Reason for Consult: + Troponin            History of Present Illness: Patient is a 63 y.o. female with a PMHx of hypertension, hyperlipidemia, history of cigarette smoking, who was admitted to [facility] on 01/18/2014 for evaluation of altered mental status. She was diagnosed as having meningitis.  She was found to have a positive troponin level and we were asked to see her in consultation.  No history was obtainable from the patient . I was able to talk with her daughter. She's not had any episodes of chest pain or shortness of breath. She has a headache and a bad cold for the past week.    She reported had some chest pain back in August, 2014. A stress Myoview study was negative for ischemia.  She had  normal left ventricular systolic function with an ejection fraction of 74%.  Echo yesterday shows EF 55-60%.   She remains minimally responsive - a bit better today   Medications: Outpatient medications: Prescriptions prior to admission  Medication Sig Dispense Refill  . aspirin 81 MG chewable tablet Chew 81 mg by mouth daily.      . diphenhydramine-acetaminophen (TYLENOL PM) 25-500 MG TABS Take 2 tablets by mouth at bedtime as needed (for sleep).      Marland Kitchen escitalopram (LEXAPRO) 10 MG tablet Take 10 mg by mouth daily.      Marland Kitchen lisinopril (PRINIVIL,ZESTRIL) 20 MG tablet Take 20 mg by mouth daily.      . Multiple Vitamins-Minerals (MULTIVITAMIN PO) Take 1 tablet by mouth daily.      . simvastatin (ZOCOR) 20 MG tablet Take 20 mg by mouth daily.        Current medications: Current Facility-Administered Medications  Medication Dose Route Frequency Provider Last Rate Last Dose  . 0.9 %  sodium chloride infusion   Intravenous Continuous  Tommie Sams, DO 100 mL/hr at 01/21/14 0430 1 mL at 01/21/14 0430  . acetaminophen (TYLENOL) tablet 650 mg  650 mg Oral Q6H PRN Myra Rude, MD       Or  . acetaminophen (TYLENOL) suppository 650 mg  650 mg Rectal Q6H PRN Myra Rude, MD      . antiseptic oral rinse (CPC / CETYLPYRIDINIUM CHLORIDE 0.05%) solution 7 mL  7 mL Mouth Rinse BID Myra Rude, MD   7 mL at 01/21/14 1000  . aspirin suppository 300 mg  300 mg Rectal Daily Chalfant N Rumley, DO      . cefTRIAXone (ROCEPHIN) 2 g in dextrose 5 % 50 mL IVPB  2 g Intravenous Q12H Tommie Sams, DO   2 g at 01/21/14 0959  . dexamethasone (DECADRON) injection 10 mg  10 mg Intravenous 4 times per day Judyann Munson, MD   10 mg at 01/21/14 0555  . heparin injection 5,000 Units  5,000 Units Subcutaneous 3 times per day Myra Rude, MD   5,000 Units at 01/21/14 0555  . LORazepam (ATIVAN) injection 1 mg  1 mg Intravenous Q6H  PRN Tommie Sams, DO   1 mg at 01/20/14 2247  . morphine 2 MG/ML injection 1 mg  1 mg Intravenous Q2H PRN Tommie Sams, DO   1 mg at 01/21/14 0209     No Known Allergies   Past Medical History  Diagnosis Date  . Cataract   . Depression   . Anxiety   . HTN (hypertension)   . Hyperlipemia     Past Surgical History  Procedure Laterality Date  . Cyst removal bilateral wrists      Family History  Problem Relation Age of Onset  . Heart attack Mother 89  . Stroke Mother   . Hypertension Mother   . Heart attack Father 24    Social History:  reports that she quit smoking about 2 years ago. Her smoking use included Cigarettes. She has a 20 pack-year smoking history. She does not have any smokeless tobacco history on file. She reports that she does not drink alcohol or use illicit drugs.   Physical Exam: BP 149/73  Pulse 77  Temp(Src) 98.4 F (36.9 C) (Oral)  Resp 20  Ht 5' 6.53" (1.69 m)  Wt 242 lb 9 oz (110.026 kg)  BMI 38.52 kg/m2  SpO2 96%  Wt Readings from Last 3 Encounters:  01/18/14  242 lb 9 oz (110.026 kg)  10/17/13 238 lb (107.956 kg)  05/22/13 236 lb (107.049 kg)    General: Vital signs reviewed and noted. Somulent/ lethargic   Head: Normocephalic, atraumatic, sclera anicteric,   Neck: Supple. Negative for carotid bruits. No JVD   Lungs:  Clear bilaterally, no  wheezes, rales, or rhonchi. Breathing is normal   Heart: RRR with S1 S2. No murmurs, rubs, or gallops   Abdomen:  Soft, non-tender, non-distended with normoactive bowel sounds. No hepatomegaly. No rebound/guarding. No obvious abdominal masses   MSK: Strength and the appear normal for age.   Extremities: No clubbing or cyanosis. No edema.  Distal pedal pulses are 2+ and equal   Neurologic: Lethargic, minimally responsive   Psych: NA    Lab results: Basic Metabolic Panel:  Recent Labs Lab 01/19/14 0253 01/20/14 0335 01/21/14 0315  NA 139 143 139  K 3.7 3.4* 3.8  CL 105 107 105  CO2 17* 21 20  GLUCOSE 122* 118* 153*  BUN 16 16 25*  CREATININE 0.67 0.74 0.66  CALCIUM 8.1* 8.5 8.4    Liver Function Tests:  Recent Labs Lab 01/18/14 1615  AST 33  ALT 25  ALKPHOS 105  BILITOT 0.7  PROT 8.6*  ALBUMIN 3.9   No results found for this basename: LIPASE, AMYLASE,  in the last 168 hours  Recent Labs Lab 01/18/14 1939  AMMONIA 47    CBC:  Recent Labs Lab 01/18/14 1615 01/19/14 0253 01/20/14 0335 01/20/14 1222 01/21/14 0315  WBC 13.9* 16.7* 17.9*  --  15.4*  NEUTROABS 12.8*  --   --  17.0*  --   HGB 13.6 11.8* 11.4*  --  11.4*  HCT 43.2 37.2 35.5*  --  35.7*  MCV 82.1 80.0 80.3  --  79.7  PLT 232 213 206  --  249    Cardiac Enzymes:  Recent Labs Lab 01/18/14 2338 01/19/14 0253 01/19/14 1320 01/20/14 1222  TROPONINI 2.83* 3.27* 5.31* 1.52*    BNP: No components found with this basename: POCBNP,   CBG:  Recent Labs Lab 01/18/14 1557 01/19/14 0024  GLUCAP 188* 109*    Coagulation Studies:  Recent Labs  01/18/14  1943  LABPROT 15.3*  INR 1.21     Other  results:  EKG: Normal sinus rhythm at 97. She has nonspecific ST-T wave changes.   Imaging: No results found.   Assessment & Plan:  1. Positive troponin levels:   patient has a history of cigarette smoking in the past. She also has a history of hypertension and hyperlipidemia. She denies any episodes of angina. She had a negative Myoview study in August, 2014. I suspect that her troponin elevation is do to demand ischemia.  At this point she still has severely altered mental status it is not a candidate for catheterization. Her EKG appears to be nonacute.  When she recovers, we can decide whether she needs a repeat Myoview study or needs to have a heart catheterization.   He echo reveals normal LV function.  No segmental wall motion abnormalities. The EF is lower than the previous evaluation done by [facility] but I do not think we can make anything significant from this change because it was done with a different techniques.    She needs to make significant improvement before we can decide how aggressive to be in our evaluation of her coronary status.    She is waking up some but still needs to make significant progress before we can make a decision on her work up  She may go down to MRI off tele.   2. Hypertension: The patient's blood pressure is fairly well controlled.  3. Meningitis:  Blood cultures show GPC in pairs.  CSF is no growth yet.  Plans per medicine service.     Vesta Mixer, Montez Hageman., MD, [facility] 01/21/2014, 10:54 AM Office - [phone] Pager [phone]

## 2014-01-21 NOTE — Progress Notes (Signed)
FMTS Attending Note Patient seen and examined by me, discussed with resident team.  The patient is accompanied by sister at bedside, who reports that patient is making progress and is more alert today than previously. Still sleeps a lot. Is alert and able to respond verbally to basic questions.  Continue treatment for Strep pneumo meningitis; MRI/MRA brain done today with findings consistent with CNS infection.  Cardiology workup as patient continues to improve from infectious point of view. Begin to think about eventual disposition. To progress to out-of-chair when able to tolerate, PT/OT etc. Paula Compton, MD

## 2014-01-21 NOTE — Evaluation (Signed)
Clinical/Bedside Swallow Evaluation Patient Details  Name: DHRUVI PFLANZ MRN: [id number] Date of Birth: Jul 21, 1950  Today's Date: 01/21/2014 Time: 1430-1510 SLP Time Calculation (min): 40 min  Past Medical History:  Past Medical History  Diagnosis Date  . Cataract   . Depression   . Anxiety   . HTN (hypertension)   . Hyperlipemia    Past Surgical History:  Past Surgical History  Procedure Laterality Date  . Cyst removal bilateral wrists     HPI:  63 year old female admitted 01/18/14 due to AMS, sepsis due to meningitis. PMH significant for depression, anxiety, HTN. MRI revealed acute meningitis with possible right inferior cerebellar infarct.   Assessment / Plan / Recommendation Clinical Impression  Oral care completed with assistance. Oral motor strength and function appear adequate. No overt s/s aspiration observed with any consistency tested. Pt's sister present, and reported loose teeth and painful gums.  For this reason, will begin dys 2 (finely chopped) diet with thin liquids. Anticipate advancing diet as tolerated. No further ST intervention recommended at this time. Please reconsult if needs arise.    Aspiration Risk  Mild    Diet Recommendation Dysphagia 2 (Fine chop);Thin liquid   Liquid Administration via: Straw;Spoon Medication Administration: Whole meds with liquid Supervision: Patient able to self feed;Staff to assist with self feeding Compensations: Slow rate;Small sips/bites Postural Changes and/or Swallow Maneuvers: Seated upright 90 degrees    Other  Recommendations Oral Care Recommendations: Oral care BID Other Recommendations: Clarify dietary restrictions   Follow Up Recommendations  None    Frequency and Duration        Pertinent Vitals/Pain VSS, no pain reported    SLP Swallow Goals  n/a   Swallow Study Prior Functional Status   No history of swallowing difficulty    General Date of Onset: 01/18/14 HPI: 63 year old female admitted  01/18/14 due to AMS, sepsis due to meningitis. PMH significant for depression, anxiety, HTN. MRI revealed acute meningitis with possible right inferior cerebellar infarct. Type of Study: Bedside swallow evaluation Previous Swallow Assessment: none Diet Prior to this Study: NPO Temperature Spikes Noted: No Respiratory Status: Room air History of Recent Intubation: No Behavior/Cognition: Alert;Hard of hearing;Cooperative;Pleasant mood Oral Cavity - Dentition: Adequate natural dentition Self-Feeding Abilities: Able to feed self;Needs assist Patient Positioning: Upright in bed Baseline Vocal Quality: Clear Volitional Cough: Weak Volitional Swallow: Able to elicit    Oral/Motor/Sensory Function Overall Oral Motor/Sensory Function: Appears within functional limits for tasks assessed   Ice Chips Ice chips: Within functional limits Presentation: Spoon   Thin Liquid Thin Liquid: Within functional limits Presentation: Straw;Cup    Nectar Thick Nectar Thick Liquid: Not tested   Honey Thick Honey Thick Liquid: Not tested   Puree Puree: Within functional limits Presentation: Spoon   Solid   GO   Celia B. Bueche, MSP, CCC-SLP [phone] Solid: Within functional limits       Leigh Aurora 01/21/2014,3:10 PM

## 2014-01-21 NOTE — Progress Notes (Signed)
Family Medicine Teaching Service Daily Progress Note Intern Pager: [phone]  Patient name: [name] [name] Medical record number: [id number] Date of birth: [birth year] Age: 63 y.o. Gender: female  Primary Care Provider: Tonye Pearson, MD Consultants: Cardiology, Neurology, ID Code Status: Full Code  Pt Overview and Major Events to Date:  9/18 - Admitted for suspected meningitis 9/19 - Coverage changed to Vanc/CTX/Amp, ID consulted 9/20 - Coverage changed to Vanc/CTX per ID  Assessment and Plan: [name] [name] is a 63 y.o. female presenting with altered mental status suspected due to meningitis. PMH is significant for depression, anxiety, hypertension and hyperlipidemia.   #Acute encephalopathy secondary to Sepsis from meningitis - Work up consistent with bacterial meningitis (CSF - >600 protein, <2 glucose, WBC 11321 (90% neutrophils).  - Initially covered with Vanc/Zosyn/Acyclovir.  Changed to Vanc/CTX/Ampicillin on 9/19.  Ampicillin discontinued on 9/20.  ID consulted and following recs.  - CTX (9/19 >>>) (Day#3)  - Vanc (9/18-9/21)- stopped today by ID  - Ampicillin (9/19-9/20)  - Zosyn (9/18 - 9/19)  - Acyclovir (9/18 - 9/19) - Steroids started on 9/19  -Day #3 of 4 day regimen  -Glucose of 153 today -Micro:  - Blood Cx:  Gram Positive Cocci in Pairs, concerning for Strep. pneumo   -Repeat Blood Cx drawn on 9/20   -Sensitive to Ceftriaxone (0.19) and Levofloxacin (1.5)  - Urine Cx- no growth to date  - CSF Cx- WBC present both PMN and mononuclear; no organisms seen  - RPR- nonreactive  - HIV- nonreactive  - Strep Pneumo Urinary Antigen- Postive  - HSV- none detected  - Influenza- negative  -HIV pending - WBC trended down to 15.4 from 17.9 yesterday - Will continue neuro checks - Echo showed no signs of murmurs or vegetations - Attempt MRI today - Speech evaluation today for possible initiation of diet  #Elevated Troponin - Likely secondary to demand ischemia  in setting of Sepsis - Troponin trending down--1.52 yesterday down from 5.31   - Negative Myoview study in August 2014 - Cardiology consulted and appreciate recs  -Consider repeat Myoview vs. Catheterization pending recovery from acute illness  -Echo to determine LV function: EF 55-60%; abnormal left ventricular relaxation (grade 1 diastolic dysfunction).  #HTN- 24hr range of 141-158 / 83-126 - BP mildly elevated this am at 158/86 - Holding home ACEI  #HLD:  On simvastatin 20 mg daily  - Holding in setting of critical illness and NPO status.  #Depression/Anxiety: holding all home (lexapro 10 mg) medications.   FEN/GI: NPO, NS @ 100 mL/hr PPx: Heparin  Disposition: Stepdown. Patient ill appearing. Continuing Abx and will monitor closely for improvement.   Subjective:  No acute complaints overnight.  Family states she has been communicating with them periodically and seems to be moving both upper and lower extremities equally.  They have noted increased difficulty in hearing. Per nursing, when lucid [name] [name] is noted to be AAOx3. No further complaints today.  Objective: Temp:  [97.8 F (36.6 C)-98.1 F (36.7 C)] 98 F (36.7 C) (09/21 0000) Pulse Rate:  [83-88] 86 (09/20 2200) BP: (141-158)/(83-126) 158/86 mmHg (09/21 0400) SpO2:  [94 %-97 %] 95 % (09/21 0400)  Physical Exam: General: 62yo female resting comfortably; arousable; nonverbal Cardiovascular: S1 and S2 noted. No murmurs noted. Regular rhythm. Tachycardic.  Respiratory: Appears clear, but difficult to auscultate secondary to snoring. No increased work of breathing noted. Abdomen: Bowel sounds noted. Soft and non-distended. No tenderness or masses to palpation. Extremities: Warm and well  perfused. No edema noted.  Neuro: Awakes and opens eyes when prompted, but not following commands   Laboratory:  Recent Labs Lab 01/19/14 0253 01/20/14 0335 01/21/14 0315  WBC 16.7* 17.9* 15.4*  HGB 11.8* 11.4* 11.4*  HCT  37.2 35.5* 35.7*  PLT 213 206 249    Recent Labs Lab 01/18/14 1615 01/19/14 0253 01/20/14 0335 01/21/14 0315  NA 138 139 143 139  K 3.3* 3.7 3.4* 3.8  CL 95* 105 107 105  CO2 25 17* 21 20  BUN 25*  CREATININE 0.94 0.67 0.74 0.66  CALCIUM 9.6 8.1* 8.5 8.4  PROT 8.6*  --   --   --   BILITOT 0.7  --   --   --   ALKPHOS 105  --   --   --   ALT 25  --   --   --   AST 33  --   --   --   GLUCOSE 185* 122* 118* 153*   POC Troponin - 0.63  Cardiac Panel (last 3 results)  Recent Labs  01/19/14 0253 01/19/14 1320 01/20/14 1222  TROPONINI 3.27* 5.31* 1.52*   Lactic acid - 3.27  CSF - >600 protein, <2 glucose, WBC 11321 (90% neutrophils).  Micro: Blood cx (9/18)- gram positive cocci; Strep pneumonia CSF cx (9/18)- WBC present both PMN and mononuclear; no organisms seen Urine cx (9/18)- no growth to date RPR- nonreactive HIV- nonreactive Strep Pneumo urinary antigen- positive  Hemoglobin A1C- 6.3  Imaging/Diagnostic Tests:  Ct Head Wo Contrast 01/18/2014    IMPRESSION: There is slight asymmetry of the cerebellar peduncles with the left cerebellar peduncle slightly lower in density relative to the right. This may reflect normal variance versus an infarct. If there is further clinical concern recommend MRI.  Otherwise no acute intracranial pathology.    In ED, findings were discussed with Neurologist Dr. Roseanne Reno.  He felt that this was artifact. Recommended MRI.  Dg Chest Port 1 View 01/18/2014   CLINICAL DATA:  Altered mental status  EXAM: PORTABLE CHEST - 1 VIEW  COMPARISON:  10/11/2012  FINDINGS: Hypoventilation with bibasilar atelectasis. Mild cardiac enlargement without heart failure or effusion.  IMPRESSION: Hypoventilation with mild bibasilar atelectasis.   Echo- Left ventricle: EF 55-60%; abnormal left ventricular relaxation (grade 1 diastolic dysfunction).  [address] [place], [place] 01/21/2014, 8:00 AM PGY-1, [facility] Family Medicine FPTS Intern pager:  [phone], text pages welcome

## 2014-01-21 NOTE — Progress Notes (Signed)
c 

## 2014-01-21 NOTE — Progress Notes (Addendum)
INFECTIOUS DISEASE PROGRESS NOTE  ID: [name] [name] is a 63 y.o. female with  Active Problems:   Sepsis   Altered mental status  Subjective: Fatigued. Difficulty with hearing in R ear.  Still some weakness in R hand.   Abtx:  Anti-infectives   Start     Dose/Rate Route Frequency Ordered Stop   01/21/14 0200  vancomycin (VANCOCIN) IVPB 1000 mg/200 mL premix     1,000 mg 200 mL/hr over 60 Minutes Intravenous Every 8 hours 01/20/14 1811     01/20/14 1815  vancomycin (VANCOCIN) IVPB 1000 mg/200 mL premix     1,000 mg 200 mL/hr over 60 Minutes Intravenous NOW 01/20/14 1812 01/20/14 1930   01/19/14 1115  ampicillin (OMNIPEN) 2 g in sodium chloride 0.9 % 50 mL IVPB  Status:  Discontinued     2 g 150 mL/hr over 20 Minutes Intravenous 6 times per day 01/19/14 1107 01/20/14 1056   01/19/14 1000  cefTRIAXone (ROCEPHIN) 2 g in dextrose 5 % 50 mL IVPB     2 g 100 mL/hr over 30 Minutes Intravenous Every 12 hours 01/19/14 0827     01/19/14 0830  ampicillin (OMNIPEN) 2 g in sodium chloride 0.9 % 50 mL IVPB  Status:  Discontinued     2 g 150 mL/hr over 20 Minutes Intravenous 6 times per day 01/19/14 0828 01/19/14 1107   01/19/14 0500  vancomycin (VANCOCIN) IVPB 1000 mg/200 mL premix  Status:  Discontinued     1,000 mg 200 mL/hr over 60 Minutes Intravenous Every 12 hours 01/18/14 1627 01/20/14 1811   01/19/14 0000  piperacillin-tazobactam (ZOSYN) IVPB 3.375 g  Status:  Discontinued     3.375 g 12.5 mL/hr over 240 Minutes Intravenous Every 8 hours 01/18/14 1627 01/19/14 0827   01/18/14 2100  acyclovir (ZOVIRAX) 605 mg in dextrose 5 % 100 mL IVPB  Status:  Discontinued     10 mg/kg  60.5 kg (Ideal) 112.1 mL/hr over 60 Minutes Intravenous 3 times per day 01/18/14 2000 01/19/14 0911   01/18/14 1630  piperacillin-tazobactam (ZOSYN) IVPB 3.375 g     3.375 g 100 mL/hr over 30 Minutes Intravenous  Once 01/18/14 1616 01/18/14 1815   01/18/14 1630  vancomycin (VANCOCIN) IVPB 1000 mg/200 mL premix      1,000 mg 200 mL/hr over 60 Minutes Intravenous  Once 01/18/14 1616 01/18/14 2050      Medications:  Scheduled: . antiseptic oral rinse  7 mL Mouth Rinse BID  . aspirin  300 mg Rectal Daily  . cefTRIAXone (ROCEPHIN)  IV  2 g Intravenous Q12H  . dexamethasone  10 mg Intravenous 4 times per day  . heparin  5,000 Units Subcutaneous 3 times per day  . vancomycin  1,000 mg Intravenous Q8H    Objective: Vital signs in last 24 hours: Temp:  [97.8 F (36.6 C)-98.4 F (36.9 C)] 98.4 F (36.9 C) (09/21 0800) Pulse Rate:  [77-88] 77 (09/21 1000) BP: (141-170)/(73-126) 149/73 mmHg (09/21 1000) SpO2:  [94 %-97 %] 96 % (09/21 1000)   General appearance: fatigued and no distress Neck: resists movement of her neck Resp: clear to auscultation bilaterally Cardio: regular rate and rhythm GI: normal findings: bowel sounds normal and soft, non-tender  Lab Results  Recent Labs  01/20/14 0335 01/21/14 0315  WBC 17.9* 15.4*  HGB 11.4* 11.4*  HCT 35.5* 35.7*  NA 143 139  K 3.4* 3.8  CL 107 105  CO2 21 20  BUN 16 25*  CREATININE  0.74 0.66   Liver Panel  Recent Labs  01/18/14 1615  PROT 8.6*  ALBUMIN 3.9  AST 33  ALT 25  ALKPHOS 105  BILITOT 0.7   Sedimentation Rate No results found for this basename: ESRSEDRATE,  in the last 72 hours C-Reactive Protein No results found for this basename: CRP,  in the last 72 hours  Microbiology: Recent Results (from the past 240 hour(s))  URINE CULTURE     Status: None   Collection Time    01/18/14  4:32 PM      Result Value Ref Range Status   Specimen Description URINE, CATHETERIZED   Final   Special Requests NONE   Final   Culture  Setup Time     Final   Value: 01/18/2014 17:43     Performed at [facility] Count     Final   Value: NO GROWTH     Performed at [facility]   Culture     Final   Value: NO GROWTH     Performed at [facility]   Report Status 01/19/2014 FINAL   Final  CULTURE,  BLOOD (ROUTINE X 2)     Status: None   Collection Time    01/18/14  4:49 PM      Result Value Ref Range Status   Specimen Description BLOOD ARM RIGHT   Final   Special Requests BOTTLES DRAWN AEROBIC AND ANAEROBIC 5CC   Final   Culture  Setup Time     Final   Value: 01/18/2014 21:55     Performed at [facility]   Culture     Final   Value: STREPTOCOCCUS PNEUMONIAE     Note: Gram Stain Report Called to,Read Back By and Verified With: CHRIS A@ 1034 ON [id number] BY [facility]     Performed at [facility]   Report Status 01/21/2014 FINAL   Final   Organism ID, Bacteria STREPTOCOCCUS PNEUMONIAE   Final  CULTURE, BLOOD (ROUTINE X 2)     Status: None   Collection Time    01/18/14  5:01 PM      Result Value Ref Range Status   Specimen Description BLOOD HAND RIGHT   Final   Special Requests BOTTLES DRAWN AEROBIC AND ANAEROBIC 4CC   Final   Culture  Setup Time     Final   Value: 01/18/2014 21:57     Performed at [facility]   Culture     Final   Value: STREPTOCOCCUS PNEUMONIAE     Note: SUSCEPTIBILITIES PERFORMED ON PREVIOUS CULTURE WITHIN THE LAST 5 DAYS.     Note: Gram Stain Report Called to,Read Back By and Verified With: CHRIS A@ 1034 ON [id number] BY [facility]     Performed at [facility]   Report Status 01/21/2014 FINAL   Final  CSF CULTURE     Status: None   Collection Time    01/18/14  9:20 PM      Result Value Ref Range Status   Specimen Description CSF   Final   Special Requests NO 2 2CC   Final   Gram Stain     Final   Value: WBC PRESENT,BOTH PMN AND MONONUCLEAR     NO ORGANISMS SEEN     CYTOSPIN Performed at [facility]     Performed at [facility]   Culture     Final   Value: NO GROWTH 2 DAYS  Performed at [facility]   Report Status PENDING   Incomplete  GRAM STAIN     Status: None   Collection Time    01/18/14  9:20 PM      Result Value Ref Range Status   Specimen Description CSF   Final   Special Requests NO 2 2CC    Final   Gram Stain     Final   Value: WBC PRESENT,BOTH PMN AND MONONUCLEAR     NO ORGANISMS SEEN     CYTOSPIN SLIDE   Report Status 01/18/2014 FINAL   Final  MRSA PCR SCREENING     Status: None   Collection Time    01/18/14 10:18 PM      Result Value Ref Range Status   MRSA by PCR NEGATIVE  NEGATIVE Final   Comment:            The GeneXpert MRSA Assay (FDA     approved for NASAL specimens     only), is one component of a     comprehensive MRSA colonization     surveillance program. It is not     intended to diagnose MRSA     infection nor to guide or     monitor treatment for     MRSA infections.  CULTURE, BLOOD (ROUTINE X 2)     Status: None   Collection Time    01/20/14 12:22 PM      Result Value Ref Range Status   Specimen Description BLOOD RIGHT ARM   Final   Special Requests BOTTLES DRAWN AEROBIC ONLY 8CC   Final   Culture  Setup Time     Final   Value: 01/20/2014 18:15     Performed at [facility]   Culture     Final   Value:        BLOOD CULTURE RECEIVED NO GROWTH TO DATE CULTURE WILL BE HELD FOR 5 DAYS BEFORE ISSUING A FINAL NEGATIVE REPORT     Performed at [facility]   Report Status PENDING   Incomplete  CULTURE, BLOOD (ROUTINE X 2)     Status: None   Collection Time    01/20/14  2:10 PM      Result Value Ref Range Status   Specimen Description BLOOD RIGHT ARM   Final   Special Requests BOTTLES DRAWN AEROBIC AND ANAEROBIC 5CC   Final   Culture  Setup Time     Final   Value: 01/20/2014 18:14     Performed at [facility]   Culture     Final   Value:        BLOOD CULTURE RECEIVED NO GROWTH TO DATE CULTURE WILL BE HELD FOR 5 DAYS BEFORE ISSUING A FINAL NEGATIVE REPORT     Performed at [facility]   Report Status PENDING   Incomplete    Studies/Results: No results found.   Assessment/Plan: Meningitis Bacteremia Sepsis  Total days of antibiotics: 4 ceftriaxone/vanco/steroids  Isolate is pneumococcus (I- PEN) Will  stop vanco Will watch her course. Her hearing loss may be an ongoing issue. Does she need MRI? Check HIV         Johny Sax Infectious Diseases (pager) [phone] [email or web address] 01/21/2014, 10:39 AM  LOS: 3 days

## 2014-01-22 LAB — BASIC METABOLIC PANEL
Anion gap: 11 (ref 5–15)
BUN: 31 mg/dL — AB (ref 6–23)
CHLORIDE: 108 meq/L (ref 96–112)
CO2: 23 meq/L (ref 19–32)
CREATININE: 0.83 mg/dL (ref 0.50–1.10)
Calcium: 8.5 mg/dL (ref 8.4–10.5)
GFR calc Af Amer: 86 mL/min — ABNORMAL LOW (ref >90)
GFR calc non Af Amer: 74 mL/min — ABNORMAL LOW (ref >90)
GLUCOSE: 127 mg/dL — AB (ref 70–99)
POTASSIUM: 3.6 meq/L — AB (ref 3.7–5.3)
Sodium: 142 mEq/L (ref 137–147)

## 2014-01-22 LAB — PATHOLOGIST SMEAR REVIEW

## 2014-01-22 LAB — CBC
HEMATOCRIT: 36.2 % (ref 36.0–46.0)
HEMOGLOBIN: 11.6 g/dL — AB (ref 12.0–15.0)
MCH: 25.7 pg — ABNORMAL LOW (ref 26.0–34.0)
MCHC: 32 g/dL (ref 30.0–36.0)
MCV: 80.1 fL (ref 78.0–100.0)
Platelets: 280 10*3/uL (ref 150–400)
RBC: 4.52 MIL/uL (ref 3.87–5.11)
RDW: 13 % (ref 11.5–15.5)
WBC: 11.9 10*3/uL — AB (ref 4.0–10.5)

## 2014-01-22 LAB — CSF CULTURE W GRAM STAIN: Culture: NO GROWTH

## 2014-01-22 MED ORDER — LISINOPRIL 20 MG PO TABS
20.0000 mg | ORAL_TABLET | Freq: Every day | ORAL | Status: DC
  Administered 2014-01-22 – 2014-01-23 (×2): 20 mg via ORAL
  Filled 2014-01-22 (×4): qty 1

## 2014-01-22 MED ORDER — LISINOPRIL 20 MG PO TABS: 20.0000 mg | ORAL_TABLET | Freq: Every day | ORAL | Status: DC

## 2014-01-22 MED ORDER — POTASSIUM CHLORIDE CRYS ER 20 MEQ PO TBCR
40.0000 meq | EXTENDED_RELEASE_TABLET | Freq: Once | ORAL | Status: AC
  Administered 2014-01-22: 40 meq via ORAL
  Filled 2014-01-22: qty 2

## 2014-01-22 NOTE — Progress Notes (Signed)
CONSULT NOTE  Date: 01/22/2014               Patient Name:  [name] [name] MRN: [id number]  DOB: Oct 29, 1950 Age / Sex: 63 y.o., female        PCP: Tonye Pearson Primary Cardiologist: Clifton James            Referring Physician: Lum Babe              Reason for Consult: + Troponin            History of Present Illness: Patient is a 63 y.o. female with a PMHx of hypertension, hyperlipidemia, history of cigarette smoking, who was admitted to [facility] on 01/18/2014 for evaluation of altered mental status. She was diagnosed as having meningitis.  She was found to have a positive troponin level and we were asked to see her in consultation.  No history was obtainable from the patient . I was able to talk with her daughter. She's not had any episodes of chest pain or shortness of breath. She has a headache and a bad cold for the past week.    She reported had some chest pain back in August, 2014. A stress Myoview study was negative for ischemia.  She had  normal left ventricular systolic function with an ejection fraction of 74%.  Echo yesterday shows EF 55-60%.   Her neuro status is clearly better today.  Still too early to consider cardiac work up.  Medications: Outpatient medications: Prescriptions prior to admission  Medication Sig Dispense Refill  . aspirin 81 MG chewable tablet Chew 81 mg by mouth daily.      . diphenhydramine-acetaminophen (TYLENOL PM) 25-500 MG TABS Take 2 tablets by mouth at bedtime as needed (for sleep).      Marland Kitchen escitalopram (LEXAPRO) 10 MG tablet Take 10 mg by mouth daily.      Marland Kitchen lisinopril (PRINIVIL,ZESTRIL) 20 MG tablet Take 20 mg by mouth daily.      . Multiple Vitamins-Minerals (MULTIVITAMIN PO) Take 1 tablet by mouth daily.      . simvastatin (ZOCOR) 20 MG tablet Take 20 mg by mouth daily.        Current medications: Current Facility-Administered Medications  Medication Dose Route Frequency Provider Last Rate Last Dose  . 0.9 %  sodium chloride  infusion   Intravenous Continuous Tommie Sams, DO 100 mL/hr at 01/21/14 2143    . acetaminophen (TYLENOL) tablet 650 mg  650 mg Oral Q6H PRN Myra Rude, MD       Or  . acetaminophen (TYLENOL) suppository 650 mg  650 mg Rectal Q6H PRN Myra Rude, MD      . antiseptic oral rinse (CPC / CETYLPYRIDINIUM CHLORIDE 0.05%) solution 7 mL  7 mL Mouth Rinse BID Myra Rude, MD   7 mL at 01/21/14 2200  . aspirin chewable tablet 81 mg  81 mg Oral Daily Jacquiline Doe, MD   81 mg at 01/21/14 1523  . cefTRIAXone (ROCEPHIN) 2 g in dextrose 5 % 50 mL IVPB  2 g Intravenous Q12H Tommie Sams, DO   2 g at 01/21/14 2113  . dexamethasone (DECADRON) injection 10 mg  10 mg Intravenous 4 times per day Judyann Munson, MD   10 mg at 01/22/14 0500  . heparin injection 5,000 Units  5,000 Units Subcutaneous 3 times per day Myra Rude, MD   5,000 Units at 01/22/14 0500  . LORazepam (ATIVAN) injection 1  mg  1 mg Intravenous Q6H PRN Tommie Sams, DO   1 mg at 01/21/14 2256  . morphine 2 MG/ML injection 1 mg  1 mg Intravenous Q2H PRN Tommie Sams, DO   1 mg at 01/22/14 0207     No Known Allergies   Past Medical History  Diagnosis Date  . Cataract   . Depression   . Anxiety   . HTN (hypertension)   . Hyperlipemia     Past Surgical History  Procedure Laterality Date  . Cyst removal bilateral wrists      Family History  Problem Relation Age of Onset  . Heart attack Mother 20  . Stroke Mother   . Hypertension Mother   . Heart attack Father 76    Social History:  reports that she quit smoking about 2 years ago. Her smoking use included Cigarettes. She has a 20 pack-year smoking history. She does not have any smokeless tobacco history on file. She reports that she does not drink alcohol or use illicit drugs.   Physical Exam: BP 164/72  Pulse 71  Temp(Src) 98.8 F (37.1 C) (Oral)  Resp 20  Ht 5' 6.53" (1.69 m)  Wt 242 lb 9 oz (110.026 kg)  BMI 38.52 kg/m2  SpO2 92%  Wt Readings from  Last 3 Encounters:  01/18/14 242 lb 9 oz (110.026 kg)  10/17/13 238 lb (107.956 kg)  05/22/13 236 lb (107.049 kg)    General: Vital signs reviewed and noted.    Head: Normocephalic, atraumatic, sclera anicteric,   Neck: Supple. Negative for carotid bruits. No JVD   Lungs:  Clear bilaterally, no  wheezes, rales, or rhonchi. Breathing is normal   Heart: RRR with S1 S2. No murmurs, rubs, or gallops   Abdomen:  Soft, non-tender, non-distended with normoactive bowel sounds. No hepatomegaly. No rebound/guarding. No obvious abdominal masses   MSK: Strength and the appear normal for age.   Extremities: No clubbing or cyanosis. No edema.  Distal pedal pulses are 2+ and equal   Neurologic: Awake, answers questions with some help / coaching    Psych:     Lab results: Basic Metabolic Panel:  Recent Labs Lab 01/20/14 0335 01/21/14 0315 01/22/14 0227  NA 143 139 142  K 3.4* 3.8 3.6*  CL 107 105 108  CO2 21 20 23   GLUCOSE 118* 153* 127*  BUN 16 25* 31*  CREATININE 0.74 0.66 0.83  CALCIUM 8.5 8.4 8.5    Liver Function Tests:  Recent Labs Lab 01/18/14 1615  AST 33  ALT 25  ALKPHOS 105  BILITOT 0.7  PROT 8.6*  ALBUMIN 3.9   No results found for this basename: LIPASE, AMYLASE,  in the last 168 hours  Recent Labs Lab 01/18/14 1939  AMMONIA 47    CBC:  Recent Labs Lab 01/18/14 1615 01/19/14 0253 01/20/14 0335 01/20/14 1222 01/21/14 0315 01/22/14 0227  WBC 13.9* 16.7* 17.9*  --  15.4* 11.9*  NEUTROABS 12.8*  --   --  17.0*  --   --   HGB 13.6 11.8* 11.4*  --  11.4* 11.6*  HCT 43.2 37.2 35.5*  --  35.7* 36.2  MCV 82.1 80.0 80.3  --  79.7 80.1  PLT 232 213 206  --  249 280    Cardiac Enzymes:  Recent Labs Lab 01/18/14 2338 01/19/14 0253 01/19/14 1320 01/20/14 1222  TROPONINI 2.83* 3.27* 5.31* 1.52*    BNP: No components found with this basename: POCBNP,   CBG:  Recent Labs Lab 01/18/14 1557 01/19/14 0024  GLUCAP 188* 109*    Coagulation  Studies: No results found for this basename: LABPROT, INR,  in the last 72 hours   Other results:  EKG: Normal sinus rhythm at 97. She has nonspecific ST-T wave changes.   Imaging: Mr Shirlee Latch GN Contrast  01/21/2014   ADDENDUM REPORT: 01/21/2014 14:06  ADDENDUM: Study discussed by telephone with House Staff Dr. Jimmey Ralph On 01/21/2014 at 1354 hours. He acknowledges a clinical diagnosis of Streptococcus pneumoniae sepsis and meningitis.  We discussed repeat brain MRI without and with contrast if the patient does not improve as expected on treatment.   Electronically Signed   By: Augusto Gamble M.D.   On: 01/21/2014 14:06   01/21/2014   CLINICAL DATA:  63 year old female with altered mental status, headache, fever of 101, encephalopathy. Upper respiratory infection prodrome. Initial encounter.  EXAM: MRI HEAD WITHOUT CONTRAST  MRA HEAD WITHOUT CONTRAST  TECHNIQUE: Multiplanar, multiecho pulse sequences of the brain and surrounding structures were obtained without intravenous contrast. Angiographic images of the head were obtained using MRA technique without contrast.  COMPARISON:  Head CT without contrast 918 1,015. Brain MRI 05/17/2013.  FINDINGS: MRI HEAD FINDINGS  Major intracranial vascular flow voids are stable.  There are multiple scattered small foci of restricted diffusion, seemingly not corresponding to vascular territories. These include areas around the atria of the lateral ventricles (series 3, image 16) and anterior to both frontal horns (seen on series 4, image 28). Also there is suggestion of numerous small foci of restricted diffusion in the subarachnoid spaces at the vertex (series 3, image 27).  There is also a more confluent small focus of restricted diffusion in the inferior right cerebellar tonsil laterally. However, there was some preexisting T2 hyperintensity in this region previously. This seems to be near a small emissary vein or venous lake in the overlying bone (see series 2, image 11 of  the recent CT).  At the same time there is suggestion of some T2 hypo intense material with in the atria of the lateral ventricle, and more convincingly within the chronically enlarged left Meckel cave (series 5, image 7). The latter also shows restricted diffusion (series 3, image 8).  However, there is no convincing cerebral edema. No intracranial mass effect. No acute intracranial hemorrhage identified. Grossly Stable pituitary. Grossly negative visualized cervical spine.  New left greater than right mastoid fluid, sphenoid sinus fluid levels and mucosal thickening, and other paranasal sinus mucosal thickening. Visualized orbit soft tissues are within normal limits. Bone marrow signal is stable and within normal limits. No acute scalp soft tissue finding identified.  MRA HEAD FINDINGS  Antegrade flow in the posterior circulation with codominant distal vertebral arteries. Normal vertebrobasilar junction. Normal left PICA origin. Dominant appearing right AICA. No basilar artery stenosis. SCA and PCA origins are within normal limits. Diminutive posterior communicating arteries. Bilateral PCA branches are within normal limits.  Antegrade flow in both ICA siphons. No siphon stenosis. There are 2 mm or smaller superior hypophyseal infundibula or small aneurysms (e.g. on the right). See series 7, image 68.  Patent carotid termini. MCA and ACA origins are patent. Motion artifact degrades some detail of the bilateral MCA and ACA branches, which appear to remain within normal limits.  IMPRESSION: 1. Diffusion and other more subtle signal abnormalities in the brain, new compared to 05/17/2013, most suggestive of acute meningitis / ventriculitis in this setting. 2. It is possible there is a superimposed small right  inferior cerebellar infarct located near an emissary vein. However, favor instead this is infectious and related to #1. 3. No intracranial mass effect or ventriculomegaly. 4. Negative intracranial MRA, except for  ICA small 2 mm superior hypophyseal aneurysms or infundibula.  Electronically Signed: By: Augusto Gamble M.D. On: 01/21/2014 13:39   Mr Brain Wo Contrast  01/21/2014   ADDENDUM REPORT: 01/21/2014 14:06  ADDENDUM: Study discussed by telephone with House Staff Dr. Jimmey Ralph On 01/21/2014 at 1354 hours. He acknowledges a clinical diagnosis of Streptococcus pneumoniae sepsis and meningitis.  We discussed repeat brain MRI without and with contrast if the patient does not improve as expected on treatment.   Electronically Signed   By: Augusto Gamble M.D.   On: 01/21/2014 14:06   01/21/2014   CLINICAL DATA:  63 year old female with altered mental status, headache, fever of 101, encephalopathy. Upper respiratory infection prodrome. Initial encounter.  EXAM: MRI HEAD WITHOUT CONTRAST  MRA HEAD WITHOUT CONTRAST  TECHNIQUE: Multiplanar, multiecho pulse sequences of the brain and surrounding structures were obtained without intravenous contrast. Angiographic images of the head were obtained using MRA technique without contrast.  COMPARISON:  Head CT without contrast 918 1,015. Brain MRI 05/17/2013.  FINDINGS: MRI HEAD FINDINGS  Major intracranial vascular flow voids are stable.  There are multiple scattered small foci of restricted diffusion, seemingly not corresponding to vascular territories. These include areas around the atria of the lateral ventricles (series 3, image 16) and anterior to both frontal horns (seen on series 4, image 28). Also there is suggestion of numerous small foci of restricted diffusion in the subarachnoid spaces at the vertex (series 3, image 27).  There is also a more confluent small focus of restricted diffusion in the inferior right cerebellar tonsil laterally. However, there was some preexisting T2 hyperintensity in this region previously. This seems to be near a small emissary vein or venous lake in the overlying bone (see series 2, image 11 of the recent CT).  At the same time there is suggestion of some  T2 hypo intense material with in the atria of the lateral ventricle, and more convincingly within the chronically enlarged left Meckel cave (series 5, image 7). The latter also shows restricted diffusion (series 3, image 8).  However, there is no convincing cerebral edema. No intracranial mass effect. No acute intracranial hemorrhage identified. Grossly Stable pituitary. Grossly negative visualized cervical spine.  New left greater than right mastoid fluid, sphenoid sinus fluid levels and mucosal thickening, and other paranasal sinus mucosal thickening. Visualized orbit soft tissues are within normal limits. Bone marrow signal is stable and within normal limits. No acute scalp soft tissue finding identified.  MRA HEAD FINDINGS  Antegrade flow in the posterior circulation with codominant distal vertebral arteries. Normal vertebrobasilar junction. Normal left PICA origin. Dominant appearing right AICA. No basilar artery stenosis. SCA and PCA origins are within normal limits. Diminutive posterior communicating arteries. Bilateral PCA branches are within normal limits.  Antegrade flow in both ICA siphons. No siphon stenosis. There are 2 mm or smaller superior hypophyseal infundibula or small aneurysms (e.g. on the right). See series 7, image 68.  Patent carotid termini. MCA and ACA origins are patent. Motion artifact degrades some detail of the bilateral MCA and ACA branches, which appear to remain within normal limits.  IMPRESSION: 1. Diffusion and other more subtle signal abnormalities in the brain, new compared to 05/17/2013, most suggestive of acute meningitis / ventriculitis in this setting. 2. It is possible there is a superimposed  small right inferior cerebellar infarct located near an emissary vein. However, favor instead this is infectious and related to #1. 3. No intracranial mass effect or ventriculomegaly. 4. Negative intracranial MRA, except for ICA small 2 mm superior hypophyseal aneurysms or infundibula.   Electronically Signed: By: Augusto Gamble M.D. On: 01/21/2014 13:39     Assessment & Plan:  1. Positive troponin levels:   patient has a history of cigarette smoking in the past. She also has a history of hypertension and hyperlipidemia. She denies any episodes of angina. She had a negative Myoview study in August, 2014. I suspect that her troponin elevation is do to demand ischemia.  At this point she still has severely altered mental status it is not a candidate for catheterization. Her EKG appears to be nonacute.  At this point, she is completely asymptomatic from a cardiac standpoint.    She needs to make significant improvement before we can decide how aggressive to be in our evaluation of her coronary status.   Fortunately, she is very stable from a cardiology standpoint and there is no urgent need to do the [facility]  / or cath.   I would wait until she has recovered and is ambulatory.     Vesta Mixer, Montez Hageman., MD, [facility] 01/22/2014, 9:55 AM Office - [phone] Pager [phone]

## 2014-01-22 NOTE — Progress Notes (Signed)
Family Medicine Teaching Service Daily Progress Note Intern Pager: [phone]  Patient name: [name] [name] Medical record number: [id number] Date of birth: Jan 12, 1951 Age: 63 y.o. Gender: female  Primary Care Provider: Tonye Pearson, MD Consultants: Cardiology, Neurology, ID Code Status: Full Code  Pt Overview and Major Events to Date:  9/18 - Admitted for suspected meningitis 9/19 - Coverage changed to Vanc/CTX/Amp, ID consulted 9/20 - Coverage changed to Vanc/CTX per ID  Assessment and Plan: [name] [name] is a 63 y.o. female presenting with altered mental status suspected due to meningitis. PMH is significant for depression, anxiety, hypertension and hyperlipidemia.   #Acute encephalopathy secondary to Sepsis from meningitis - Work up consistent with bacterial meningitis (CSF - >600 protein, <2 glucose, WBC 11321 (90% neutrophils).  - Abx: CTX (9/19 >>>) (Day#3)  - Vanc (9/18-9/21)- stopped on 9/21  - Ampicillin (9/19-9/20)  - Zosyn (9/18 - 9/19)  - Acyclovir (9/18 - 9/19) - Steroids started on 9/19  -Day #4 of 4 day regimen  -Glucose of 127 today -Micro:  - Blood Cx:  Gram Positive Cocci in Pairs, concerning for Strep. pneumo   -Repeat Blood Cx drawn on 9/20   -Sensitive to Ceftriaxone (0.19) and Levofloxacin (1.5)  - Urine Cx- no growth to date  - CSF Cx- WBC present both PMN and mononuclear; no organisms seen  - RPR- nonreactive  - HIV- nonreactive  - Strep Pneumo Urinary Antigen- Postive  - HSV- none detected  - Influenza- negative - WBC trended down to 11.9 from 15.4 yesterday - Will continue neuro checks - Echo showed no signs of murmurs or vegetations - MRI- Diffusion and other more subtle signal abnormalities in the brain, new compared to 05/17/2013, most suggestive of acute meningitis / ventriculitis in this setting. It is possible there is a superimposed small right inferior  cerebellar infarct located near an emissary vein. However, favor  instead this  is infectious and related to #1.  - Speech Eval- Dys 2 (Finely Chopped) Diet with thin liquids. Advance as tolerated.  #Elevated Troponin - Likely secondary to demand ischemia in setting of Sepsis - Troponin trending down to 1.52 - Negative Myoview study in August 2014 - Cardiology consulted and appreciate recs  -Consider repeat Myoview vs. Catheterization pending recovery from acute illness  -Echo to determine LV function: EF 55-60%; abnormal left ventricular relaxation (grade 1 diastolic dysfunction).  #HTN- 24hr range of 149-181 / 72-98 - BP mildly elevated this am at 164/72 - Holding home ACEI  #HLD:  On simvastatin 20 mg daily  - Holding in setting of critical illness and NPO status.  #Depression/Anxiety: holding all home (lexapro 10 mg) medications.   FEN/GI: Dys 2 (Finely Chopped) Diet with thin liquids, NS @ 100 mL/hr PPx: Heparin  Disposition: Stepdown. Patient ill appearing. Continuing Abx and will monitor closely for improvement. Most likely IR vs. SNF at discharge.  Subjective:  No acute complaints overnight.  She is much more alert today.  Complains of pain in neck and back.  She continues to have hearing loss and complains of seeing double today.  Sisters are present in room and states she is doing much better today.  Objective: Temp:  [98.1 F (36.7 C)-98.4 F (36.9 C)] 98.2 F (36.8 C) (09/22 0400) Pulse Rate:  [71-81] 71 (09/22 0400) BP: (149-181)/(72-102) 164/72 mmHg (09/22 0400) SpO2:  [94 %-96 %] 94 % (09/22 0400)  Physical Exam: General: 62yo female resting comfortably and in no apparent distress. Alert and conversing today.  Cardiovascular: S1 and S2 noted. No murmurs noted. Regular rhythm. Tachycardic.  Respiratory: Clear to auscultation bialterally. No wheezing noted. No increased work of breathing noted. Abdomen: Bowel sounds noted. Soft and non-distended. No tenderness or masses to palpation. Extremities: Warm and well perfused. No edema noted.   Neuro: Muscle strength equal bilaterally in upper and lower extremities.  No sensory deficits noted.   Laboratory:  Recent Labs Lab 01/20/14 0335 01/21/14 0315 01/22/14 0227  WBC 17.9* 15.4* 11.9*  HGB 11.4* 11.4* 11.6*  HCT 35.5* 35.7* 36.2  PLT 206 249 280    Recent Labs Lab 01/18/14 1615  01/20/14 0335 01/21/14 0315 01/22/14 0227  NA 138  < > 143 139 142  K 3.3*  < > 3.4* 3.8 3.6*  CL 95*  < > 107 105 108  CO2 25  < > 21 20 23   BUN 16  < > 16 25* 31*  CREATININE 0.94  < > 0.74 0.66 0.83  CALCIUM 9.6  < > 8.5 8.4 8.5  PROT 8.6*  --   --   --   --   BILITOT 0.7  --   --   --   --   ALKPHOS 105  --   --   --   --   ALT 25  --   --   --   --   AST 33  --   --   --   --   GLUCOSE 185*  < > 118* 153* 127*  < > = values in this interval not displayed. POC Troponin - 0.63  Cardiac Panel (last 3 results)  Recent Labs  01/19/14 1320 01/20/14 1222  TROPONINI 5.31* 1.52*   Lactic acid - 3.27  CSF - >600 protein, <2 glucose, WBC 11321 (90% neutrophils).  Micro: Blood cx (9/18)- gram positive cocci; Strep pneumonia CSF cx (9/18)- WBC present both PMN and mononuclear; no organisms seen Urine cx (9/18)- no growth to date RPR- nonreactive HIV- nonreactive Strep Pneumo urinary antigen- positive  Hemoglobin A1C- 6.3  Imaging/Diagnostic Tests:  Ct Head Wo Contrast 01/18/2014    IMPRESSION: There is slight asymmetry of the cerebellar peduncles with the left cerebellar peduncle slightly lower in density relative to the right. This may reflect normal variance versus an infarct. If there is further clinical concern recommend MRI.  Otherwise no acute intracranial pathology.    In ED, findings were discussed with Neurologist Dr. Roseanne Reno.  He felt that this was artifact. Recommended MRI.  Dg Chest Port 1 View 01/18/2014   CLINICAL DATA:  Altered mental status  EXAM: PORTABLE CHEST - 1 VIEW  COMPARISON:  10/11/2012  FINDINGS: Hypoventilation with bibasilar atelectasis. Mild  cardiac enlargement without heart failure or effusion.  IMPRESSION: Hypoventilation with mild bibasilar atelectasis.   Echo- Left ventricle: EF 55-60%; abnormal left ventricular relaxation (grade 1 diastolic dysfunction).  01/21/2014:  - MRI Head- Diffusion and other more subtle signal abnormalities in the brain, new compared to 05/17/2013, most suggestive of acute meningitis / ventriculitis in this setting. It is possible there is a superimposed small right inferior  cerebellar infarct located near an emissary vein. However, favor  instead this is infectious and related to #1.   [address] [place], [place] 01/22/2014, 7:24 AM PGY-1, [facility] Family Medicine FPTS Intern pager: [phone], text pages welcome

## 2014-01-22 NOTE — Progress Notes (Signed)
FMTS attending note Patient seen and examined by me today, discussed with resident team and I agree with Dr Richarda Overlie assessment/plan. Patient to be transferred from SDU to telemetry. To complete her abx/steroid course as per ID recs. For Cardiology workup as determined by the Cardiology service.  Paula Compton, MD

## 2014-01-22 NOTE — Progress Notes (Signed)
INFECTIOUS DISEASE PROGRESS NOTE  ID: [name] [name] is a 63 y.o. female with  Active Problems:   Sepsis   Altered mental status  Subjective: More talkative this AM.   Abtx:  Anti-infectives   Start     Dose/Rate Route Frequency Ordered Stop   01/21/14 0200  vancomycin (VANCOCIN) IVPB 1000 mg/200 mL premix  Status:  Discontinued     1,000 mg 200 mL/hr over 60 Minutes Intravenous Every 8 hours 01/20/14 1811 01/21/14 1050   01/20/14 1815  vancomycin (VANCOCIN) IVPB 1000 mg/200 mL premix     1,000 mg 200 mL/hr over 60 Minutes Intravenous NOW 01/20/14 1812 01/20/14 1930   01/19/14 1115  ampicillin (OMNIPEN) 2 g in sodium chloride 0.9 % 50 mL IVPB  Status:  Discontinued     2 g 150 mL/hr over 20 Minutes Intravenous 6 times per day 01/19/14 1107 01/20/14 1056   01/19/14 1000  cefTRIAXone (ROCEPHIN) 2 g in dextrose 5 % 50 mL IVPB     2 g 100 mL/hr over 30 Minutes Intravenous Every 12 hours 01/19/14 0827     01/19/14 0830  ampicillin (OMNIPEN) 2 g in sodium chloride 0.9 % 50 mL IVPB  Status:  Discontinued     2 g 150 mL/hr over 20 Minutes Intravenous 6 times per day 01/19/14 0828 01/19/14 1107   01/19/14 0500  vancomycin (VANCOCIN) IVPB 1000 mg/200 mL premix  Status:  Discontinued     1,000 mg 200 mL/hr over 60 Minutes Intravenous Every 12 hours 01/18/14 1627 01/20/14 1811   01/19/14 0000  piperacillin-tazobactam (ZOSYN) IVPB 3.375 g  Status:  Discontinued     3.375 g 12.5 mL/hr over 240 Minutes Intravenous Every 8 hours 01/18/14 1627 01/19/14 0827   01/18/14 2100  acyclovir (ZOVIRAX) 605 mg in dextrose 5 % 100 mL IVPB  Status:  Discontinued     10 mg/kg  60.5 kg (Ideal) 112.1 mL/hr over 60 Minutes Intravenous 3 times per day 01/18/14 2000 01/19/14 0911   01/18/14 1630  piperacillin-tazobactam (ZOSYN) IVPB 3.375 g     3.375 g 100 mL/hr over 30 Minutes Intravenous  Once 01/18/14 1616 01/18/14 1815   01/18/14 1630  vancomycin (VANCOCIN) IVPB 1000 mg/200 mL premix     1,000  mg 200 mL/hr over 60 Minutes Intravenous  Once 01/18/14 1616 01/18/14 2050      Medications:  Scheduled: . antiseptic oral rinse  7 mL Mouth Rinse BID  . aspirin  81 mg Oral Daily  . cefTRIAXone (ROCEPHIN)  IV  2 g Intravenous Q12H  . dexamethasone  10 mg Intravenous 4 times per day  . heparin  5,000 Units Subcutaneous 3 times per day    Objective: Vital signs in last 24 hours: Temp:  [98.1 F (36.7 C)-98.8 F (37.1 C)] 98.8 F (37.1 C) (09/22 0800) Pulse Rate:  [65-82] 82 (09/22 1000) BP: (156-185)/(72-124) 185/124 mmHg (09/22 0800) SpO2:  [89 %-96 %] 90 % (09/22 1000)   General appearance: alert, cooperative and no distress Neck: no adenopathy, supple, symmetrical, trachea midline and FROM laterally, limited flexion Resp: clear to auscultation bilaterally Cardio: regular rate and rhythm GI: normal findings: bowel sounds normal and soft, non-tender RUE grip strength much better  Lab Results  Recent Labs  01/21/14 0315 01/22/14 0227  WBC 15.4* 11.9*  HGB 11.4* 11.6*  HCT 35.7* 36.2  NA 139 142  K 3.8 3.6*  CL 105 108  CO2 20 23  BUN 25* 31*  CREATININE 0.66 0.83  Liver Panel No results found for this basename: PROT, ALBUMIN, AST, ALT, ALKPHOS, BILITOT, BILIDIR, IBILI,  in the last 72 hours Sedimentation Rate No results found for this basename: ESRSEDRATE,  in the last 72 hours C-Reactive Protein No results found for this basename: CRP,  in the last 72 hours  Microbiology: Recent Results (from the past 240 hour(s))  URINE CULTURE     Status: None   Collection Time    01/18/14  4:32 PM      Result Value Ref Range Status   Specimen Description URINE, CATHETERIZED   Final   Special Requests NONE   Final   Culture  Setup Time     Final   Value: 01/18/2014 17:43     Performed at [facility] Count     Final   Value: NO GROWTH     Performed at [facility]   Culture     Final   Value: NO GROWTH     Performed at [facility]   Report Status 01/19/2014 FINAL   Final  CULTURE, BLOOD (ROUTINE X 2)     Status: None   Collection Time    01/18/14  4:49 PM      Result Value Ref Range Status   Specimen Description BLOOD ARM RIGHT   Final   Special Requests BOTTLES DRAWN AEROBIC AND ANAEROBIC 5CC   Final   Culture  Setup Time     Final   Value: 01/18/2014 21:55     Performed at [facility]   Culture     Final   Value: STREPTOCOCCUS PNEUMONIAE     Note: Gram Stain Report Called to,Read Back By and Verified With: CHRIS A@ 1034 ON [id number] BY [facility]     Performed at [facility]   Report Status 01/21/2014 FINAL   Final   Organism ID, Bacteria STREPTOCOCCUS PNEUMONIAE   Final  CULTURE, BLOOD (ROUTINE X 2)     Status: None   Collection Time    01/18/14  5:01 PM      Result Value Ref Range Status   Specimen Description BLOOD HAND RIGHT   Final   Special Requests BOTTLES DRAWN AEROBIC AND ANAEROBIC 4CC   Final   Culture  Setup Time     Final   Value: 01/18/2014 21:57     Performed at [facility]   Culture     Final   Value: STREPTOCOCCUS PNEUMONIAE     Note: SUSCEPTIBILITIES PERFORMED ON PREVIOUS CULTURE WITHIN THE LAST 5 DAYS.     Note: Gram Stain Report Called to,Read Back By and Verified With: CHRIS A@ 1034 ON [id number] BY [facility]     Performed at [facility]   Report Status 01/21/2014 FINAL   Final  CSF CULTURE     Status: None   Collection Time    01/18/14  9:20 PM      Result Value Ref Range Status   Specimen Description CSF   Final   Special Requests NO 2 2CC   Final   Gram Stain     Final   Value: WBC PRESENT,BOTH PMN AND MONONUCLEAR     NO ORGANISMS SEEN     CYTOSPIN Performed at [facility]     Performed at [facility]   Culture     Final   Value: NO GROWTH 3 DAYS     Performed at [facility]   Report Status  01/22/2014 FINAL   Final  GRAM STAIN     Status: None   Collection Time    01/18/14  9:20 PM      Result Value Ref Range  Status   Specimen Description CSF   Final   Special Requests NO 2 2CC   Final   Gram Stain     Final   Value: WBC PRESENT,BOTH PMN AND MONONUCLEAR     NO ORGANISMS SEEN     CYTOSPIN SLIDE   Report Status 01/18/2014 FINAL   Final  VIRAL CULTURE VIRC     Status: None   Collection Time    01/18/14  9:20 PM      Result Value Ref Range Status   Specimen Description CSF   Final   Special Requests NONE   Final   Culture     Final   Value: Culture has been initiated.     Performed at [facility]   Report Status PENDING   Incomplete  MRSA PCR SCREENING     Status: None   Collection Time    01/18/14 10:18 PM      Result Value Ref Range Status   MRSA by PCR NEGATIVE  NEGATIVE Final   Comment:            The GeneXpert MRSA Assay (FDA     approved for NASAL specimens     only), is one component of a     comprehensive MRSA colonization     surveillance program. It is not     intended to diagnose MRSA     infection nor to guide or     monitor treatment for     MRSA infections.  CULTURE, BLOOD (ROUTINE X 2)     Status: None   Collection Time    01/20/14 12:22 PM      Result Value Ref Range Status   Specimen Description BLOOD RIGHT ARM   Final   Special Requests BOTTLES DRAWN AEROBIC ONLY 8CC   Final   Culture  Setup Time     Final   Value: 01/20/2014 18:15     Performed at [facility]   Culture     Final   Value:        BLOOD CULTURE RECEIVED NO GROWTH TO DATE CULTURE WILL BE HELD FOR 5 DAYS BEFORE ISSUING A FINAL NEGATIVE REPORT     Performed at [facility]   Report Status PENDING   Incomplete  CULTURE, BLOOD (ROUTINE X 2)     Status: None   Collection Time    01/20/14  2:10 PM      Result Value Ref Range Status   Specimen Description BLOOD RIGHT ARM   Final   Special Requests BOTTLES DRAWN AEROBIC AND ANAEROBIC 5CC   Final   Culture  Setup Time     Final   Value: 01/20/2014 18:14     Performed at [facility]   Culture     Final   Value:         BLOOD CULTURE RECEIVED NO GROWTH TO DATE CULTURE WILL BE HELD FOR 5 DAYS BEFORE ISSUING A FINAL NEGATIVE REPORT     Performed at [facility]   Report Status PENDING   Incomplete    Studies/Results: Mr Shirlee Latch ZO Contrast  01/21/2014   ADDENDUM REPORT: 01/21/2014 14:06  ADDENDUM: Study discussed by telephone with House Staff Dr. Jimmey Ralph On 01/21/2014 at 1354 hours. He acknowledges  a clinical diagnosis of Streptococcus pneumoniae sepsis and meningitis.  We discussed repeat brain MRI without and with contrast if the patient does not improve as expected on treatment.   Electronically Signed   By: Augusto Gamble M.D.   On: 01/21/2014 14:06   01/21/2014   CLINICAL DATA:  63 year old female with altered mental status, headache, fever of 101, encephalopathy. Upper respiratory infection prodrome. Initial encounter.  EXAM: MRI HEAD WITHOUT CONTRAST  MRA HEAD WITHOUT CONTRAST  TECHNIQUE: Multiplanar, multiecho pulse sequences of the brain and surrounding structures were obtained without intravenous contrast. Angiographic images of the head were obtained using MRA technique without contrast.  COMPARISON:  Head CT without contrast 918 1,015. Brain MRI 05/17/2013.  FINDINGS: MRI HEAD FINDINGS  Major intracranial vascular flow voids are stable.  There are multiple scattered small foci of restricted diffusion, seemingly not corresponding to vascular territories. These include areas around the atria of the lateral ventricles (series 3, image 16) and anterior to both frontal horns (seen on series 4, image 28). Also there is suggestion of numerous small foci of restricted diffusion in the subarachnoid spaces at the vertex (series 3, image 27).  There is also a more confluent small focus of restricted diffusion in the inferior right cerebellar tonsil laterally. However, there was some preexisting T2 hyperintensity in this region previously. This seems to be near a small emissary vein or venous lake in the overlying bone  (see series 2, image 11 of the recent CT).  At the same time there is suggestion of some T2 hypo intense material with in the atria of the lateral ventricle, and more convincingly within the chronically enlarged left Meckel cave (series 5, image 7). The latter also shows restricted diffusion (series 3, image 8).  However, there is no convincing cerebral edema. No intracranial mass effect. No acute intracranial hemorrhage identified. Grossly Stable pituitary. Grossly negative visualized cervical spine.  New left greater than right mastoid fluid, sphenoid sinus fluid levels and mucosal thickening, and other paranasal sinus mucosal thickening. Visualized orbit soft tissues are within normal limits. Bone marrow signal is stable and within normal limits. No acute scalp soft tissue finding identified.  MRA HEAD FINDINGS  Antegrade flow in the posterior circulation with codominant distal vertebral arteries. Normal vertebrobasilar junction. Normal left PICA origin. Dominant appearing right AICA. No basilar artery stenosis. SCA and PCA origins are within normal limits. Diminutive posterior communicating arteries. Bilateral PCA branches are within normal limits.  Antegrade flow in both ICA siphons. No siphon stenosis. There are 2 mm or smaller superior hypophyseal infundibula or small aneurysms (e.g. on the right). See series 7, image 68.  Patent carotid termini. MCA and ACA origins are patent. Motion artifact degrades some detail of the bilateral MCA and ACA branches, which appear to remain within normal limits.  IMPRESSION: 1. Diffusion and other more subtle signal abnormalities in the brain, new compared to 05/17/2013, most suggestive of acute meningitis / ventriculitis in this setting. 2. It is possible there is a superimposed small right inferior cerebellar infarct located near an emissary vein. However, favor instead this is infectious and related to #1. 3. No intracranial mass effect or ventriculomegaly. 4. Negative  intracranial MRA, except for ICA small 2 mm superior hypophyseal aneurysms or infundibula.  Electronically Signed: By: Augusto Gamble M.D. On: 01/21/2014 13:39   Mr Brain Wo Contrast  01/21/2014   ADDENDUM REPORT: 01/21/2014 14:06  ADDENDUM: Study discussed by telephone with House Staff Dr. Jimmey Ralph On 01/21/2014 at 1354 hours.  He acknowledges a clinical diagnosis of Streptococcus pneumoniae sepsis and meningitis.  We discussed repeat brain MRI without and with contrast if the patient does not improve as expected on treatment.   Electronically Signed   By: Augusto Gamble M.D.   On: 01/21/2014 14:06   01/21/2014   CLINICAL DATA:  63 year old female with altered mental status, headache, fever of 101, encephalopathy. Upper respiratory infection prodrome. Initial encounter.  EXAM: MRI HEAD WITHOUT CONTRAST  MRA HEAD WITHOUT CONTRAST  TECHNIQUE: Multiplanar, multiecho pulse sequences of the brain and surrounding structures were obtained without intravenous contrast. Angiographic images of the head were obtained using MRA technique without contrast.  COMPARISON:  Head CT without contrast 918 1,015. Brain MRI 05/17/2013.  FINDINGS: MRI HEAD FINDINGS  Major intracranial vascular flow voids are stable.  There are multiple scattered small foci of restricted diffusion, seemingly not corresponding to vascular territories. These include areas around the atria of the lateral ventricles (series 3, image 16) and anterior to both frontal horns (seen on series 4, image 28). Also there is suggestion of numerous small foci of restricted diffusion in the subarachnoid spaces at the vertex (series 3, image 27).  There is also a more confluent small focus of restricted diffusion in the inferior right cerebellar tonsil laterally. However, there was some preexisting T2 hyperintensity in this region previously. This seems to be near a small emissary vein or venous lake in the overlying bone (see series 2, image 11 of the recent CT).  At the same time  there is suggestion of some T2 hypo intense material with in the atria of the lateral ventricle, and more convincingly within the chronically enlarged left Meckel cave (series 5, image 7). The latter also shows restricted diffusion (series 3, image 8).  However, there is no convincing cerebral edema. No intracranial mass effect. No acute intracranial hemorrhage identified. Grossly Stable pituitary. Grossly negative visualized cervical spine.  New left greater than right mastoid fluid, sphenoid sinus fluid levels and mucosal thickening, and other paranasal sinus mucosal thickening. Visualized orbit soft tissues are within normal limits. Bone marrow signal is stable and within normal limits. No acute scalp soft tissue finding identified.  MRA HEAD FINDINGS  Antegrade flow in the posterior circulation with codominant distal vertebral arteries. Normal vertebrobasilar junction. Normal left PICA origin. Dominant appearing right AICA. No basilar artery stenosis. SCA and PCA origins are within normal limits. Diminutive posterior communicating arteries. Bilateral PCA branches are within normal limits.  Antegrade flow in both ICA siphons. No siphon stenosis. There are 2 mm or smaller superior hypophyseal infundibula or small aneurysms (e.g. on the right). See series 7, image 68.  Patent carotid termini. MCA and ACA origins are patent. Motion artifact degrades some detail of the bilateral MCA and ACA branches, which appear to remain within normal limits.  IMPRESSION: 1. Diffusion and other more subtle signal abnormalities in the brain, new compared to 05/17/2013, most suggestive of acute meningitis / ventriculitis in this setting. 2. It is possible there is a superimposed small right inferior cerebellar infarct located near an emissary vein. However, favor instead this is infectious and related to #1. 3. No intracranial mass effect or ventriculomegaly. 4. Negative intracranial MRA, except for ICA small 2 mm superior  hypophyseal aneurysms or infundibula.  Electronically Signed: By: Augusto Gamble M.D. On: 01/21/2014 13:39     Assessment/Plan: Meningitis  Bacteremia- pneumococcus Sepsis   MRI: acute meningitis / ventriculitis   Total days of antibiotics: 5 ceftriaxone/steroids   Isolate  is pneumococcus (I- PEN) Would continue current rx.  Stop droplet isolation Family concerned about CVA (hx of ? TIA in February), diplopia today. I reassured them that her MRI did not conclusively show CVA, a repeat may be helpful.              Johny Sax Infectious Diseases (pager) [phone] [email or web address] 01/22/2014, 11:18 AM  LOS: 4 days

## 2014-01-22 NOTE — Progress Notes (Addendum)
Patient developed skin sore from paper tape used by Phleb. Tech. Dressing was carefull removed, site was cleaned and mepilex dressing applied to RAC site were blood was drawn from.

## 2014-01-22 NOTE — Progress Notes (Signed)
Patient tx to 2w8 with family. Patient tolerated tx well.

## 2014-01-23 DIAGNOSIS — B953 Streptococcus pneumoniae as the cause of diseases classified elsewhere: Secondary | ICD-10-CM

## 2014-01-23 DIAGNOSIS — I219 Acute myocardial infarction, unspecified: Secondary | ICD-10-CM

## 2014-01-23 LAB — BASIC METABOLIC PANEL
Anion gap: 14 (ref 5–15)
BUN: 22 mg/dL (ref 6–23)
CO2: 19 meq/L (ref 19–32)
CREATININE: 0.57 mg/dL (ref 0.50–1.10)
Calcium: 8.6 mg/dL (ref 8.4–10.5)
Chloride: 106 mEq/L (ref 96–112)
GFR calc non Af Amer: >90 mL/min (ref >90)
GLUCOSE: 128 mg/dL — AB (ref 70–99)
Potassium: 3.8 mEq/L (ref 3.7–5.3)
Sodium: 139 mEq/L (ref 137–147)

## 2014-01-23 LAB — ENTEROVIRUS PCR: Enterovirus PCR: NOT DETECTED

## 2014-01-23 LAB — CBC
HEMATOCRIT: 41.4 % (ref 36.0–46.0)
HEMOGLOBIN: 13.5 g/dL (ref 12.0–15.0)
MCH: 25.7 pg — ABNORMAL LOW (ref 26.0–34.0)
MCHC: 32.6 g/dL (ref 30.0–36.0)
MCV: 78.9 fL (ref 78.0–100.0)
Platelets: 319 10*3/uL (ref 150–400)
RBC: 5.25 MIL/uL — AB (ref 3.87–5.11)
RDW: 13 % (ref 11.5–15.5)
WBC: 13.9 10*3/uL — AB (ref 4.0–10.5)

## 2014-01-23 LAB — VIRAL CULTURE VIRC: CULTURE: NOT DETECTED

## 2014-01-23 MED ORDER — ENSURE COMPLETE PO LIQD
237.0000 mL | Freq: Two times a day (BID) | ORAL | Status: DC
  Administered 2014-01-23 – 2014-01-29 (×11): 237 mL via ORAL

## 2014-01-23 MED ORDER — DEXAMETHASONE SODIUM PHOSPHATE 4 MG/ML IJ SOLN
10.0000 mg | Freq: Four times a day (QID) | INTRAMUSCULAR | Status: AC
  Administered 2014-01-23: 10 mg via INTRAVENOUS
  Filled 2014-01-23: qty 2.5

## 2014-01-23 MED ORDER — HYDRALAZINE HCL 20 MG/ML IJ SOLN: 5.0000 mg | Freq: Four times a day (QID) | INTRAMUSCULAR | Status: DC | PRN

## 2014-01-23 MED ORDER — HYDRALAZINE HCL 20 MG/ML IJ SOLN
5.0000 mg | Freq: Once | INTRAMUSCULAR | Status: AC
  Administered 2014-01-23: 5 mg via INTRAVENOUS
  Filled 2014-01-23: qty 1

## 2014-01-23 MED ORDER — SENNA 8.6 MG PO TABS
1.0000 | ORAL_TABLET | Freq: Every day | ORAL | Status: DC
  Administered 2014-01-23 – 2014-01-26 (×4): 8.6 mg via ORAL
  Filled 2014-01-23 (×4): qty 1

## 2014-01-23 MED ORDER — LISINOPRIL 40 MG PO TABS
40.0000 mg | ORAL_TABLET | Freq: Every day | ORAL | Status: DC
  Filled 2014-01-23: qty 1

## 2014-01-23 MED ORDER — MUPIROCIN CALCIUM 2 % EX CREA
TOPICAL_CREAM | Freq: Three times a day (TID) | CUTANEOUS | Status: DC
  Administered 2014-01-23 (×2): via TOPICAL
  Filled 2014-01-23: qty 15

## 2014-01-23 MED ORDER — CARVEDILOL 3.125 MG PO TABS
3.1250 mg | ORAL_TABLET | Freq: Two times a day (BID) | ORAL | Status: DC
  Administered 2014-01-23 – 2014-01-29 (×12): 3.125 mg via ORAL
  Filled 2014-01-23 (×16): qty 1

## 2014-01-23 MED ORDER — POTASSIUM CHLORIDE CRYS ER 20 MEQ PO TBCR
40.0000 meq | EXTENDED_RELEASE_TABLET | Freq: Once | ORAL | Status: AC
  Administered 2014-01-23: 40 meq via ORAL
  Filled 2014-01-23: qty 2

## 2014-01-23 NOTE — Evaluation (Signed)
Physical Therapy Evaluation Patient Details Name: [name] [name] MRN: [id number] DOB: Apr 05, 1951 Today's Date: 01/23/2014   History of Present Illness  63 y.o. female with a PMHx of hypertension, hyperlipidemia, history of cigarette smoking, who was admitted to [facility] on 01/18/2014 for evaluation of altered mental status. She was diagnosed as having meningitis and sepsis.  Clinical Impression  Pt admitted with above. Pt currently with functional limitations due to the deficits listed below (see PT Problem List). Pt fatigued quickly today and was worn out after sitting EOB, transferring to 3N1 and sitting for 20 minutes on 3N1.  Had to get extra help to get pt back to bed with Stedy.  Pt has not been sleeping per family and nursing.  Feel that pt is a good rehab candidate as she most likely will progress with therapy.   Pt will benefit from skilled PT to increase their independence and safety with mobility to allow discharge to the venue listed below.     Follow Up Recommendations CIR;Supervision/Assistance - 24 hour    Equipment Recommendations  Other (comment) (TBA)    Recommendations for Other Services Rehab consult     Precautions / Restrictions Precautions Precautions: Fall Restrictions Weight Bearing Restrictions: No      Mobility  Bed Mobility Overal bed mobility: +2 for physical assistance;Needs Assistance Bed Mobility: Supine to Sit;Sit to Supine     Supine to sit: Mod assist;+2 for physical assistance Sit to supine: Total assist;+2 for physical assistance   General bed mobility comments: Pt needed assist to move LEs and for elevation of trunk.  Delayed in following commands.  Pt initiating sit - sdielyingOverall delay in initiation of movement needing incr cues and time to perform activities.  Also fatigued quickly.   Transfers Overall transfer level: Needs assistance Equipment used: Rolling walker (2 wheeled) Transfers: Sit to/from [employer] Sit to  Stand: Total assist;+2 physical assistance Stand pivot transfers: Mod assist;+2 physical assistance       General transfer comment: Pt able to stand with mod assist initially and use RW to take pivotal steps to get to 3N1.  Once on 3N1 pt became more and more lethargic.  VSS.  After sitting 20 min on 3N1 pt states she finished.  Could not get pt up to RW with 2 persons.  Obtained Stedy and with 3 persons was able to get pt to semi upright to use Stedy to get pt back in bed.  Pt with delayed initiation of all movements to include standing, moving LEs, etc.    Ambulation/Gait                Stairs            Wheelchair Mobility    Modified Rankin (Stroke Patients Only)       Balance Overall balance assessment: Needs assistance;History of Falls Sitting-balance support: Bilateral upper extremity supported;Feet supported Sitting balance-Leahy Scale: Zero Sitting balance - Comments: Needed max to mod assist for sitting EOB initially.  Did get to a point where pt sat with min guard asssit for 45 seconds.   Postural control: Posterior lean Standing balance support: Bilateral upper extremity supported;During functional activity Standing balance-Leahy Scale: Poor Standing balance comment: Needed mod assist for standing with RW.                              Pertinent Vitals/Pain Pain Assessment: No/denies pain VSS    Home Living Family/patient  expects to be discharged to:: Inpatient rehab Living Arrangements: Alone Available Help at Discharge: Family;Available PRN/intermittently Type of Home: House Home Access: Stairs to enter Entrance Stairs-Rails: None Entrance Stairs-Number of Steps: 4 Home Layout: Multi-level Home Equipment: None      Prior Function Level of Independence: Independent               Hand Dominance   Dominant Hand: Right    Extremity/Trunk Assessment   Upper Extremity Assessment: Defer to OT evaluation RUE Deficits / Details:  difficulty lifting BUE against gravity due to apparent weakness.Able to grasp bar of lift. Able to grasp cup. difficulty with achieving hand to mouth pattern         Lower Extremity Assessment: RLE deficits/detail RLE Deficits / Details: grossly 2-/5    Cervical / Trunk Assessment: Kyphotic;Other exceptions (unable to maintain midline)  Communication   Communication: Expressive difficulties;Other (comment) (most likely due to lethargy)  Cognition Arousal/Alertness: Lethargic Behavior During Therapy: Flat affect Overall Cognitive Status: Impaired/Different from baseline Area of Impairment: Orientation;Attention;Memory;Following commands;Awareness;Safety/judgement;Problem solving Orientation Level: Disoriented to;Place;Time;Situation Current Attention Level: Focused Memory: Decreased short-term memory Following Commands: Follows one step commands inconsistently;Follows one step commands with increased time Safety/Judgement: Decreased awareness of safety;Decreased awareness of deficits   Problem Solving: Slow processing;Decreased initiation;Difficulty sequencing;Requires verbal cues;Requires tactile cues General Comments: greater than 10 second delay in processing. very lethargic. Family states pt did not sleep well last night    General Comments      Exercises General Exercises - Lower Extremity Ankle Circles/Pumps: AROM;Both;10 reps;Supine Long Arc Quad: AROM;Both;10 reps;Seated      Assessment/Plan    PT Assessment Patient needs continued PT services  PT Diagnosis Generalized weakness   PT Problem List Decreased activity tolerance;Decreased strength;Decreased balance;Decreased mobility;Decreased knowledge of use of DME;Decreased safety awareness;Decreased knowledge of precautions  PT Treatment Interventions DME instruction;Gait training;Functional mobility training;Therapeutic activities;Therapeutic exercise;Balance training;Patient/family education   PT Goals (Current  goals can be found in the Care Plan section) Acute Rehab PT Goals Patient Stated Goal: unable to state PT Goal Formulation: With patient/family Time For Goal Achievement: 02/06/14 Potential to Achieve Goals: Good    Frequency Min 3X/week   Barriers to discharge        Co-evaluation PT/OT/SLP Co-Evaluation/Treatment: Yes Reason for Co-Treatment: Complexity of the patient's impairments (multi-system involvement) PT goals addressed during session: Mobility/safety with mobility OT goals addressed during session: ADL's and self-care       End of Session Equipment Utilized During Treatment: Gait belt Activity Tolerance: Patient limited by fatigue Patient left: with call bell/phone within reach;with family/visitor present;in bed Nurse Communication: Mobility status         Time: 8756-4332 PT Time Calculation (min): 51 min   Charges:   PT Evaluation $Initial PT Evaluation Tier I: 1 Procedure PT Treatments $Therapeutic Exercise: 8-22 mins $Therapeutic Activity: 8-22 mins $Self Care/Home Management: 8-22   PT G Codes:          INGOLD,Avrian Delfavero 28-Jan-2014, 1:32 PM [facility] Acute Rehabilitation [phone] (pager)

## 2014-01-23 NOTE — Progress Notes (Signed)
FMTS Attending Note Patient seen and examined by me, discussed with resident team and I agree with Dr Richarda Overlie note for today. She is more awake and alert for my exam today.  Skin eruption philtrum under nose and around mouth, ?impetigo. Agree with mupirocin topical.   Persistence of diplopia and decreased hearing acuity. MRI 09/21 findings noted.  If related to patient's strep pneumo I am unclear as to how long these may persist, or if another explanation for these shouuld be sought. Paula Compton, MD

## 2014-01-23 NOTE — Progress Notes (Addendum)
INFECTIOUS DISEASE PROGRESS NOTE  ID: [name] [name] is a 63 y.o. female with  Active Problems:   Sepsis   Altered mental status   Demand myocardial ischemic related infarction  Subjective: "Are you my doctor?"  "have I seen you before?"  Abtx:  Anti-infectives   Start     Dose/Rate Route Frequency Ordered Stop   01/21/14 0200  vancomycin (VANCOCIN) IVPB 1000 mg/200 mL premix  Status:  Discontinued     1,000 mg 200 mL/hr over 60 Minutes Intravenous Every 8 hours 01/20/14 1811 01/21/14 1050   01/20/14 1815  vancomycin (VANCOCIN) IVPB 1000 mg/200 mL premix     1,000 mg 200 mL/hr over 60 Minutes Intravenous NOW 01/20/14 1812 01/20/14 1930   01/19/14 1115  ampicillin (OMNIPEN) 2 g in sodium chloride 0.9 % 50 mL IVPB  Status:  Discontinued     2 g 150 mL/hr over 20 Minutes Intravenous 6 times per day 01/19/14 1107 01/20/14 1056   01/19/14 1000  cefTRIAXone (ROCEPHIN) 2 g in dextrose 5 % 50 mL IVPB     2 g 100 mL/hr over 30 Minutes Intravenous Every 12 hours 01/19/14 0827     01/19/14 0830  ampicillin (OMNIPEN) 2 g in sodium chloride 0.9 % 50 mL IVPB  Status:  Discontinued     2 g 150 mL/hr over 20 Minutes Intravenous 6 times per day 01/19/14 0828 01/19/14 1107   01/19/14 0500  vancomycin (VANCOCIN) IVPB 1000 mg/200 mL premix  Status:  Discontinued     1,000 mg 200 mL/hr over 60 Minutes Intravenous Every 12 hours 01/18/14 1627 01/20/14 1811   01/19/14 0000  piperacillin-tazobactam (ZOSYN) IVPB 3.375 g  Status:  Discontinued     3.375 g 12.5 mL/hr over 240 Minutes Intravenous Every 8 hours 01/18/14 1627 01/19/14 0827   01/18/14 2100  acyclovir (ZOVIRAX) 605 mg in dextrose 5 % 100 mL IVPB  Status:  Discontinued     10 mg/kg  60.5 kg (Ideal) 112.1 mL/hr over 60 Minutes Intravenous 3 times per day 01/18/14 2000 01/19/14 0911   01/18/14 1630  piperacillin-tazobactam (ZOSYN) IVPB 3.375 g     3.375 g 100 mL/hr over 30 Minutes Intravenous  Once 01/18/14 1616 01/18/14 1815   01/18/14  1630  vancomycin (VANCOCIN) IVPB 1000 mg/200 mL premix     1,000 mg 200 mL/hr over 60 Minutes Intravenous  Once 01/18/14 1616 01/18/14 2050      Medications:  Scheduled: . antiseptic oral rinse  7 mL Mouth Rinse BID  . aspirin  81 mg Oral Daily  . carvedilol  3.125 mg Oral BID WC  . cefTRIAXone (ROCEPHIN)  IV  2 g Intravenous Q12H  . feeding supplement (ENSURE COMPLETE)  237 mL Oral BID BM  . heparin  5,000 Units Subcutaneous 3 times per day  . [START ON 01/24/2014] lisinopril  40 mg Oral Daily  . mupirocin cream   Topical TID  . senna  1 tablet Oral Daily    Objective: Vital signs in last 24 hours: Temp:  [97.9 F (36.6 C)-98.7 F (37.1 C)] 98.7 F (37.1 C) (09/23 1405) Pulse Rate:  [65-73] 67 (09/23 1405) Resp:  [17-18] 17 (09/23 1405) BP: (145-179)/(85-113) 145/86 mmHg (09/23 1405) SpO2:  [95 %-98 %] 98 % (09/23 1405) Weight:  [113.7 kg (250 lb 10.6 oz)] 113.7 kg (250 lb 10.6 oz) (09/23 0331)   General appearance: alert, cooperative, no distress and confused Resp: clear to auscultation bilaterally Cardio: regular rate and rhythm GI:  normal findings: bowel sounds normal and soft, non-tender Strength 5/5 RUE. Hearing better  Lab Results  Recent Labs  01/22/14 0227 01/23/14 1220  WBC 11.9* 13.9*  HGB 11.6* 13.5  HCT 36.2 41.4  NA 142 139  K 3.6* 3.8  CL 108 106  CO2 23 19  BUN 31* 22  CREATININE 0.83 0.57   Liver Panel No results found for this basename: PROT, ALBUMIN, AST, ALT, ALKPHOS, BILITOT, BILIDIR, IBILI,  in the last 72 hours Sedimentation Rate No results found for this basename: ESRSEDRATE,  in the last 72 hours C-Reactive Protein No results found for this basename: CRP,  in the last 72 hours  Microbiology: Recent Results (from the past 240 hour(s))  URINE CULTURE     Status: None   Collection Time    01/18/14  4:32 PM      Result Value Ref Range Status   Specimen Description URINE, CATHETERIZED   Final   Special Requests NONE   Final    Culture  Setup Time     Final   Value: 01/18/2014 17:43     Performed at [facility] Count     Final   Value: NO GROWTH     Performed at [facility]   Culture     Final   Value: NO GROWTH     Performed at [facility]   Report Status 01/19/2014 FINAL   Final  CULTURE, BLOOD (ROUTINE X 2)     Status: None   Collection Time    01/18/14  4:49 PM      Result Value Ref Range Status   Specimen Description BLOOD ARM RIGHT   Final   Special Requests BOTTLES DRAWN AEROBIC AND ANAEROBIC 5CC   Final   Culture  Setup Time     Final   Value: 01/18/2014 21:55     Performed at [facility]   Culture     Final   Value: STREPTOCOCCUS PNEUMONIAE     Note: Gram Stain Report Called to,Read Back By and Verified With: CHRIS A@ 1034 ON [id number] BY [facility]     Performed at [facility]   Report Status 01/21/2014 FINAL   Final   Organism ID, Bacteria STREPTOCOCCUS PNEUMONIAE   Final  CULTURE, BLOOD (ROUTINE X 2)     Status: None   Collection Time    01/18/14  5:01 PM      Result Value Ref Range Status   Specimen Description BLOOD HAND RIGHT   Final   Special Requests BOTTLES DRAWN AEROBIC AND ANAEROBIC 4CC   Final   Culture  Setup Time     Final   Value: 01/18/2014 21:57     Performed at [facility]   Culture     Final   Value: STREPTOCOCCUS PNEUMONIAE     Note: SUSCEPTIBILITIES PERFORMED ON PREVIOUS CULTURE WITHIN THE LAST 5 DAYS.     Note: Gram Stain Report Called to,Read Back By and Verified With: CHRIS A@ 1034 ON [id number] BY [facility]     Performed at [facility]   Report Status 01/21/2014 FINAL   Final  CSF CULTURE     Status: None   Collection Time    01/18/14  9:20 PM      Result Value Ref Range Status   Specimen Description CSF   Final   Special Requests NO 2 2CC   Final   Gram Stain     Final  Value: WBC PRESENT,BOTH PMN AND MONONUCLEAR     NO ORGANISMS SEEN     CYTOSPIN Performed at [facility]     Performed at  [facility]   Culture     Final   Value: NO GROWTH 3 DAYS     Performed at [facility]   Report Status 01/22/2014 FINAL   Final  GRAM STAIN     Status: None   Collection Time    01/18/14  9:20 PM      Result Value Ref Range Status   Specimen Description CSF   Final   Special Requests NO 2 2CC   Final   Gram Stain     Final   Value: WBC PRESENT,BOTH PMN AND MONONUCLEAR     NO ORGANISMS SEEN     CYTOSPIN SLIDE   Report Status 01/18/2014 FINAL   Final  VIRAL CULTURE VIRC     Status: None   Collection Time    01/18/14  9:20 PM      Result Value Ref Range Status   Specimen Description CSF   Final   Special Requests NONE   Final   Culture     Final   Value: Culture has been initiated.     Performed at [facility]   Report Status PENDING   Incomplete  MRSA PCR SCREENING     Status: None   Collection Time    01/18/14 10:18 PM      Result Value Ref Range Status   MRSA by PCR NEGATIVE  NEGATIVE Final   Comment:            The GeneXpert MRSA Assay (FDA     approved for NASAL specimens     only), is one component of a     comprehensive MRSA colonization     surveillance program. It is not     intended to diagnose MRSA     infection nor to guide or     monitor treatment for     MRSA infections.  CULTURE, BLOOD (ROUTINE X 2)     Status: None   Collection Time    01/20/14 12:22 PM      Result Value Ref Range Status   Specimen Description BLOOD RIGHT ARM   Final   Special Requests BOTTLES DRAWN AEROBIC ONLY 8CC   Final   Culture  Setup Time     Final   Value: 01/20/2014 18:15     Performed at [facility]   Culture     Final   Value:        BLOOD CULTURE RECEIVED NO GROWTH TO DATE CULTURE WILL BE HELD FOR 5 DAYS BEFORE ISSUING A FINAL NEGATIVE REPORT     Performed at [facility]   Report Status PENDING   Incomplete  CULTURE, BLOOD (ROUTINE X 2)     Status: None   Collection Time    01/20/14  2:10 PM      Result Value Ref Range  Status   Specimen Description BLOOD RIGHT ARM   Final   Special Requests BOTTLES DRAWN AEROBIC AND ANAEROBIC 5CC   Final   Culture  Setup Time     Final   Value: 01/20/2014 18:14     Performed at [facility]   Culture     Final   Value:        BLOOD CULTURE RECEIVED NO GROWTH TO DATE CULTURE WILL BE HELD FOR  5 DAYS BEFORE ISSUING A FINAL NEGATIVE REPORT     Performed at [facility]   Report Status PENDING   Incomplete    Studies/Results: No results found.   Assessment/Plan: Meningitis  Bacteremia- pneumococcus  Sepsis  MRI: acute meningitis / ventriculitis  Total days of antibiotics: 6 ceftriaxone  Isolate is pneumococcus (I- PEN)  Would continue her current rx (ceftriaxone, steroids completed) Repeat BCx 9-20 are ngtd Appreciate neuro eval for thoughts on diplopia, need for repeat MRI         Johny Sax Infectious Diseases (pager) [phone] [email or web address] 01/23/2014, 2:42 PM  LOS: 5 days

## 2014-01-23 NOTE — Progress Notes (Signed)
Patient is a 63 y.o. female with a PMHx of hypertension, hyperlipidemia, history of cigarette smoking, who was admitted to [facility] on 01/18/2014 for evaluation of altered mental status. She was diagnosed as having meningitis. She was found to have a positive troponin level and we were asked to see her in consultation.  No history was obtainable from the patient . I was able to talk with her daughter. She's not had any episodes of chest pain or shortness of breath. She has a headache and a bad cold for the past week.  She reported had some chest pain back in August, 2014. A stress Myoview study was negative for ischemia. She had normal left ventricular systolic function with an ejection fraction of 74%.  Echo yesterday shows EF 55-60%.  Her neuro status is clearly better today. Still too early to consider cardiac work up.   Subjective: Denies chest pain  Objective: Vital signs in last 24 hours: Temp:  [97.9 F (36.6 C)-98.2 F (36.8 C)] 98 F (36.7 C) (09/23 1000) Pulse Rate:  [65-73] 71 (09/23 1000) Resp:  [18] 18 (09/23 0331) BP: (137-179)/(81-113) 161/94 mmHg (09/23 1000) SpO2:  [93 %-96 %] 96 % (09/23 1000) Weight:  [250 lb 10.6 oz (113.7 kg)] 250 lb 10.6 oz (113.7 kg) (09/23 0331) Weight change:  Last BM Date: 01/22/14 Intake/Output from previous day: -1210 09/22 0701 - 09/23 0700 In: 840 [P.O.:240; I.V.:600] Out: 2050 [Urine:2050] Intake/Output this shift: Total I/O In: 120 [P.O.:120] Out: 650 [Urine:650]  PE: General:lethargic but answers questions when asked Skin:Warm and dry, brisk capillary refill HEENT:normocephalic, sclera clear, mucus membranes moist Heart:S1S2 RRR without murmur, gallup, rub or click Lungs:clear ant. without rales, rhonchi, or wheezes ZOX:WRUE, non tender, + BS, do not palpate liver spleen or masses Ext:no lower ext edema, 2+ pedal pulses, 2+ radial pulses Neuro:alert and oriented, MAE, follows commands, + facial symmetry  tele:  SR Lab  Results:  Recent Labs  01/21/14 0315 01/22/14 0227  WBC 15.4* 11.9*  HGB 11.4* 11.6*  HCT 35.7* 36.2  PLT 249 280   BMET  Recent Labs  01/21/14 0315 01/22/14 0227  NA 139 142  K 3.8 3.6*  CL 105 108  CO2 20 23  GLUCOSE 153* 127*  BUN 25* 31*  CREATININE 0.66 0.83  CALCIUM 8.4 8.5    Recent Labs  01/20/14 1222  TROPONINI 1.52*    Lab Results  Component Value Date   CHOL 163 10/17/2013   HDL 66 10/17/2013   LDLCALC 85 10/17/2013   TRIG 62 10/17/2013   CHOLHDL 2.5 10/17/2013   Lab Results  Component Value Date   HGBA1C 6.3* 01/19/2014     Lab Results  Component Value Date   TSH 2.210 01/18/2014     Studies/Results: Mr Maxine Glenn Head Wo Contrast  01/21/2014   ADDENDUM REPORT: 01/21/2014 14:06  ADDENDUM: Study discussed by telephone with House Staff Dr. Jimmey Ralph On 01/21/2014 at 1354 hours. He acknowledges a clinical diagnosis of Streptococcus pneumoniae sepsis and meningitis.  We discussed repeat brain MRI without and with contrast if the patient does not improve as expected on treatment.   Electronically Signed   By: Augusto Gamble M.D.   On: 01/21/2014 14:06   01/21/2014   CLINICAL DATA:  63 year old female with altered mental status, headache, fever of 101, encephalopathy. Upper respiratory infection prodrome. Initial encounter.  EXAM: MRI HEAD WITHOUT CONTRAST  MRA HEAD WITHOUT CONTRAST  TECHNIQUE: Multiplanar, multiecho pulse sequences of the  brain and surrounding structures were obtained without intravenous contrast. Angiographic images of the head were obtained using MRA technique without contrast.  COMPARISON:  Head CT without contrast 918 1,015. Brain MRI 05/17/2013.  FINDINGS: MRI HEAD FINDINGS  Major intracranial vascular flow voids are stable.  There are multiple scattered small foci of restricted diffusion, seemingly not corresponding to vascular territories. These include areas around the atria of the lateral ventricles (series 3, image 16) and anterior to both frontal  horns (seen on series 4, image 28). Also there is suggestion of numerous small foci of restricted diffusion in the subarachnoid spaces at the vertex (series 3, image 27).  There is also a more confluent small focus of restricted diffusion in the inferior right cerebellar tonsil laterally. However, there was some preexisting T2 hyperintensity in this region previously. This seems to be near a small emissary vein or venous lake in the overlying bone (see series 2, image 11 of the recent CT).  At the same time there is suggestion of some T2 hypo intense material with in the atria of the lateral ventricle, and more convincingly within the chronically enlarged left Meckel cave (series 5, image 7). The latter also shows restricted diffusion (series 3, image 8).  However, there is no convincing cerebral edema. No intracranial mass effect. No acute intracranial hemorrhage identified. Grossly Stable pituitary. Grossly negative visualized cervical spine.  New left greater than right mastoid fluid, sphenoid sinus fluid levels and mucosal thickening, and other paranasal sinus mucosal thickening. Visualized orbit soft tissues are within normal limits. Bone marrow signal is stable and within normal limits. No acute scalp soft tissue finding identified.  MRA HEAD FINDINGS  Antegrade flow in the posterior circulation with codominant distal vertebral arteries. Normal vertebrobasilar junction. Normal left PICA origin. Dominant appearing right AICA. No basilar artery stenosis. SCA and PCA origins are within normal limits. Diminutive posterior communicating arteries. Bilateral PCA branches are within normal limits.  Antegrade flow in both ICA siphons. No siphon stenosis. There are 2 mm or smaller superior hypophyseal infundibula or small aneurysms (e.g. on the right). See series 7, image 68.  Patent carotid termini. MCA and ACA origins are patent. Motion artifact degrades some detail of the bilateral MCA and ACA branches, which appear  to remain within normal limits.  IMPRESSION: 1. Diffusion and other more subtle signal abnormalities in the brain, new compared to 05/17/2013, most suggestive of acute meningitis / ventriculitis in this setting. 2. It is possible there is a superimposed small right inferior cerebellar infarct located near an emissary vein. However, favor instead this is infectious and related to #1. 3. No intracranial mass effect or ventriculomegaly. 4. Negative intracranial MRA, except for ICA small 2 mm superior hypophyseal aneurysms or infundibula.  Electronically Signed: By: Augusto Gamble M.D. On: 01/21/2014 13:39   Mr Brain Wo Contrast  01/21/2014   ADDENDUM REPORT: 01/21/2014 14:06  ADDENDUM: Study discussed by telephone with House Staff Dr. Jimmey Ralph On 01/21/2014 at 1354 hours. He acknowledges a clinical diagnosis of Streptococcus pneumoniae sepsis and meningitis.  We discussed repeat brain MRI without and with contrast if the patient does not improve as expected on treatment.   Electronically Signed   By: Augusto Gamble M.D.   On: 01/21/2014 14:06   01/21/2014   CLINICAL DATA:  63 year old female with altered mental status, headache, fever of 101, encephalopathy. Upper respiratory infection prodrome. Initial encounter.  EXAM: MRI HEAD WITHOUT CONTRAST  MRA HEAD WITHOUT CONTRAST  TECHNIQUE: Multiplanar, multiecho pulse sequences  of the brain and surrounding structures were obtained without intravenous contrast. Angiographic images of the head were obtained using MRA technique without contrast.  COMPARISON:  Head CT without contrast 918 1,015. Brain MRI 05/17/2013.  FINDINGS: MRI HEAD FINDINGS  Major intracranial vascular flow voids are stable.  There are multiple scattered small foci of restricted diffusion, seemingly not corresponding to vascular territories. These include areas around the atria of the lateral ventricles (series 3, image 16) and anterior to both frontal horns (seen on series 4, image 28). Also there is suggestion of  numerous small foci of restricted diffusion in the subarachnoid spaces at the vertex (series 3, image 27).  There is also a more confluent small focus of restricted diffusion in the inferior right cerebellar tonsil laterally. However, there was some preexisting T2 hyperintensity in this region previously. This seems to be near a small emissary vein or venous lake in the overlying bone (see series 2, image 11 of the recent CT).  At the same time there is suggestion of some T2 hypo intense material with in the atria of the lateral ventricle, and more convincingly within the chronically enlarged left Meckel cave (series 5, image 7). The latter also shows restricted diffusion (series 3, image 8).  However, there is no convincing cerebral edema. No intracranial mass effect. No acute intracranial hemorrhage identified. Grossly Stable pituitary. Grossly negative visualized cervical spine.  New left greater than right mastoid fluid, sphenoid sinus fluid levels and mucosal thickening, and other paranasal sinus mucosal thickening. Visualized orbit soft tissues are within normal limits. Bone marrow signal is stable and within normal limits. No acute scalp soft tissue finding identified.  MRA HEAD FINDINGS  Antegrade flow in the posterior circulation with codominant distal vertebral arteries. Normal vertebrobasilar junction. Normal left PICA origin. Dominant appearing right AICA. No basilar artery stenosis. SCA and PCA origins are within normal limits. Diminutive posterior communicating arteries. Bilateral PCA branches are within normal limits.  Antegrade flow in both ICA siphons. No siphon stenosis. There are 2 mm or smaller superior hypophyseal infundibula or small aneurysms (e.g. on the right). See series 7, image 68.  Patent carotid termini. MCA and ACA origins are patent. Motion artifact degrades some detail of the bilateral MCA and ACA branches, which appear to remain within normal limits.  IMPRESSION: 1. Diffusion and  other more subtle signal abnormalities in the brain, new compared to 05/17/2013, most suggestive of acute meningitis / ventriculitis in this setting. 2. It is possible there is a superimposed small right inferior cerebellar infarct located near an emissary vein. However, favor instead this is infectious and related to #1. 3. No intracranial mass effect or ventriculomegaly. 4. Negative intracranial MRA, except for ICA small 2 mm superior hypophyseal aneurysms or infundibula.  Electronically Signed: By: Augusto Gamble M.D. On: 01/21/2014 13:39   2D Echo: Study Conclusions - Left ventricle: The cavity size was normal. Systolic function was normal. The estimated ejection fraction was in the range of 55% to 60%. Wall motion was normal; there were no regional wall motion abnormalities. Doppler parameters are consistent with abnormal left ventricular relaxation (grade 1 diastolic dysfunction   Medications: I have reviewed the patient's current medications. Scheduled Meds: . antiseptic oral rinse  7 mL Mouth Rinse BID  . aspirin  81 mg Oral Daily  . cefTRIAXone (ROCEPHIN)  IV  2 g Intravenous Q12H  . heparin  5,000 Units Subcutaneous 3 times per day  . lisinopril  20 mg Oral Daily  . senna  1 tablet Oral Daily   Continuous Infusions: . sodium chloride 50 mL/hr (01/23/14 1003)   PRN Meds:.acetaminophen, acetaminophen, LORazepam, morphine injection  Assessment/Plan: 1. Positive troponin levels:  Pk troponin is 5.31 patient has a history of cigarette smoking in the past. She also has a history of hypertension and hyperlipidemia. She denies any episodes of angina. She had a negative Myoview study in August, 2014 & relatively normal Echo this hospitalization. It is suspected that her troponin elevation is do to demand ischemia. At this point she still has  altered mental status from meningitis, though improving but not yet a candidate for catheterization. Her EKG appears to be nonacute.   She needs to make  significant improvement before we can decide how aggressive to be in our evaluation of her coronary status.  Fortunately, she is very stable from a cardiology standpoint and there is no urgent need to do the [facility] / or cath. -- can even consider OP Myoview after d/c.  2. Meningitis 3.  Bacteremia- pneumococcus 4. HTN stable, on ACE-I, would add BB given +Troponin. 5. HLD- on simvastatin 20 mg daily on hold for nwo.    LOS: 5 days   Time spent with pt. :15 minutes. [facility] R  Nurse Practitioner Certified Pager [phone] or after 5pm and on weekends call [phone] 01/23/2014, 10:39 AM   I have seen and evaluated the patient this AM along with Nada Boozer, NP. I agree with her findings, examination as well as impression recommendations.  At this point, Demand Ischemia  (Deman Infarction) is a more accurate diagnosis than NSTEMI.  + Troponin remains a poor prognostic indicator. At this point, she is completely asymptomatic from a cardiac standpoint. Would plan on OP Myoview.  Add BB for cardioprotective effect.  Is already on ACE-I.   Statin on hold per Primary Svc.   We will be available for ?s & acute issues.  Will see every other day, unless acute issues arise. Will help arrange OP f/u & Stress Test.   Marykay Lex, M.D., M.S. Interventional Cardiologist   Pager # [phone]

## 2014-01-23 NOTE — Progress Notes (Signed)
INITIAL NUTRITION ASSESSMENT  DOCUMENTATION CODES Per approved criteria  -Obesity Unspecified   INTERVENTION: Ensure Complete po BID, each supplement provides 350 kcal and 13 grams of protein RD to follow for nutrition care plan  NUTRITION DIAGNOSIS: Inadequate oral intake related to poor appetite as evidenced by PO intake 10%  Goal: Pt to meet >/= 90% of their estimated nutrition needs   Monitor:  PO & supplemental intake, weight, labs, I/O's  Reason for Assessment: Low Braden  64 y.o. female  Admitting Dx: altered mental status  ASSESSMENT: 63 y.o. Female with HTN, HLD, depression presents with 1 day history of altered mental status, headache; was found difficult to arouse/encephalopathic with nuchal rigidity and fever of 101.3; suspected meningitis.  Pt sleeping soundly upon RD visit; unable to wake; RD unable to obtain nutrition hx; s/p bedside swallow evaluation 9/21 -- no signs/symptoms of aspiration, however, pt's sister reported loose teeth & painful gums; PO intake very poor at 10% per flowsheet records; would benefit from addition of oral nutrition supplements; RD to order.  Low braden score places patient at risk for skin breakdown.  No muscle or subcutaneous fat depletion noticed.  Height: Ht Readings from Last 1 Encounters:  01/18/14 5' 6.53" (1.69 m)    Weight: Wt Readings from Last 1 Encounters:  01/23/14 250 lb 10.6 oz (113.7 kg)    Ideal Body Weight: 130 lb  % Ideal Body Weight: 192%  Wt Readings from Last 10 Encounters:  01/23/14 250 lb 10.6 oz (113.7 kg)  10/17/13 238 lb (107.956 kg)  05/22/13 236 lb (107.049 kg)  05/08/13 235 lb 12.8 oz (106.958 kg)  04/18/13 232 lb (105.235 kg)  01/25/13 230 lb 12.8 oz (104.69 kg)  12/13/12 226 lb (102.513 kg)  12/11/12 225 lb (102.059 kg)  10/11/12 221 lb (100.245 kg)  08/10/12 229 lb (103.874 kg)    Usual Body Weight: 238 lb  % Usual Body Weight: 105%  BMI:  Body mass index is 39.81  kg/(m^2).  Estimated Nutritional Needs: Kcal: 1600-1800 Protein: 80-90 gm Fluid: 1.6-1.8 L  Skin: Intact  Diet Order: Dysphagia 2, thin liquids  EDUCATION NEEDS: -No education needs identified at this time   Intake/Output Summary (Last 24 hours) at 01/23/14 1405 Last data filed at 01/23/14 1025  Gross per 24 hour  Intake    560 ml  Output   2200 ml  Net  -1640 ml    Labs:   Recent Labs Lab 01/21/14 0315 01/22/14 0227 01/23/14 1220  NA 139 142 139  K 3.8 3.6* 3.8  CL 105 108 106  CO2 20 23 19   BUN 25* 31* 22  CREATININE 0.66 0.83 0.57  CALCIUM 8.4 8.5 8.6  GLUCOSE 153* 127* 128*    Scheduled Meds: . antiseptic oral rinse  7 mL Mouth Rinse BID  . aspirin  81 mg Oral Daily  . carvedilol  3.125 mg Oral BID WC  . cefTRIAXone (ROCEPHIN)  IV  2 g Intravenous Q12H  . heparin  5,000 Units Subcutaneous 3 times per day  . [START ON 01/24/2014] lisinopril  40 mg Oral Daily  . mupirocin cream   Topical TID  . senna  1 tablet Oral Daily    Continuous Infusions: . sodium chloride 10 mL/hr (01/23/14 1209)    Past Medical History  Diagnosis Date  . Cataract   . Depression   . Anxiety   . HTN (hypertension)   . Hyperlipemia     Past Surgical History  Procedure Laterality Date  .  Cyst removal bilateral wrists      Maureen Chatters, RD, LDN Pager #: [phone] After-Hours Pager #: [phone]

## 2014-01-23 NOTE — Progress Notes (Signed)
Family Medicine Teaching Service Daily Progress Note Intern Pager: [phone]  Patient name: [name] [name] Medical record number: [id number] Date of birth: 1950-07-05 Age: 63 y.o. Gender: female  Primary Care Provider: Tonye Pearson, MD Consultants: Cardiology, Neurology, ID Code Status: Full Code  Pt Overview and Major Events to Date:  9/18 - Admitted for suspected meningitis 9/19 - Coverage changed to Vanc/CTX/Amp, ID consulted 9/20 - Coverage changed to Vanc/CTX per ID  Assessment and Plan: [name] [name] is a 63 y.o. female presenting with altered mental status suspected due to meningitis. PMH is significant for depression, anxiety, hypertension and hyperlipidemia.   #Acute encephalopathy secondary to Sepsis from meningitis - Work up consistent with bacterial meningitis (CSF - >600 protein, <2 glucose, WBC 11321 (90% neutrophils).  - Abx: CTX (9/19 >>>) (Day#5)  - Vanc (9/18-9/21)  - Ampicillin (9/19-9/20)  - Zosyn (9/18 - 9/19)  - Acyclovir (9/18 - 9/19) - Steroids started on 9/19 (Day #5) -Micro:  - Blood Cx:  Gram Positive Cocci in Pairs, concerning for Strep. pneumo   -Repeat Blood Cx drawn on 9/20   -Sensitive to Ceftriaxone (0.19) and Levofloxacin (1.5)  - Urine Cx- no growth to date  - CSF Cx- WBC present both PMN and mononuclear; no organisms seen  - RPR- nonreactive  - HIV- nonreactive  - Strep Pneumo Urinary Antigen- Postive  - HSV- none detected  - Influenza- negative - WBC  11.9 yesterday - Will continue neuro checks - Echo showed no signs of murmurs or vegetations - MRI- Diffusion and other more subtle signal abnormalities in the brain, new compared to 05/17/2013, most suggestive of acute meningitis / ventriculitis in this setting. It is possible there is a superimposed small right inferior  cerebellar infarct located near an emissary vein. However, favor  instead this is infectious and related to #1.  - Speech Eval- Dys 2 (Finely Chopped) Diet with  thin liquids. Advance as tolerated.  #Elevated Troponin - Likely secondary to demand ischemia in setting of Sepsis - Troponin trended down to 1.52 - Negative Myoview study in August 2014 - Cardiology consulted and appreciate recs  -Consider repeat Myoview vs. Catheterization pending recovery from acute illness  -Echo to determine LV function: EF 55-60%; abnormal left ventricular relaxation (grade 1 diastolic dysfunction).  #HTN- 24hr range of 137-185 / 81-124 - BP mildly elevated this am at 164/72 - Restarted home Lisinopril on 9/23 - Given a dose of Hydralazine this morning  #HLD:  On simvastatin 20 mg daily  - Holding in setting of critical illness and NPO status.  #Depression/Anxiety: holding all home (lexapro 10 mg) medications.   FEN/GI: Dys 2 (Finely Chopped) Diet with thin liquids, NS @ 50 mL/hr, Senna PPx: Heparin  Disposition: Stepdown. Patient ill appearing. Continuing Abx and will monitor closely for improvement. Most likely IR vs. SNF at discharge.  Subjective:  Complains of high blood pressure over night and this morning and given hydralazine.  Also notes frequent urination at night, urinating every 10-15 minutes, which nursing staff verifies. Family also notes new rash around mouth and up into right nostril.  [name] [name] states rash is painful.  Trouble sleeping last night secondary to increase in urination and resulting in increased restlessness today. Continues to note decreased hearing and double vision (although improved). Continues to note neck pain.  No bowel movements in five days.  No further complaints today.  Objective: Temp:  [97.9 F (36.6 C)-98.8 F (37.1 C)] 98.2 F (36.8 C) (09/23 0331) Pulse  Rate:  [65-82] 68 (09/23 0503) Resp:  [18] 18 (09/23 0331) BP: (137-185)/(81-124) 170/93 mmHg (09/23 0503) SpO2:  [89 %-96 %] 95 % (09/23 0331) Weight:  [250 lb 10.6 oz (113.7 kg)] 250 lb 10.6 oz (113.7 kg) (09/23 0331)  Physical Exam: General: 62yo female  resting comfortably and in no apparent distress. Alert and conversing today. Neck: tender to palpation Cardiovascular: S1 and S2 noted. No murmurs noted. Regular rhythm. Tachycardic.  Respiratory: Clear to auscultation bialterally. No wheezing noted. No increased work of breathing noted. Abdomen: Bowel sounds noted. Soft and non-distended. No tenderness or masses to palpation. Extremities: Warm and well perfused. No edema noted.  Neuro: Muscle strength equal bilaterally in upper and lower extremities.  No sensory deficits noted. Double vision with distance improved since yesterday. Skin: erythematous and blistering rash noted around mouth and up into right nostril  Laboratory:  Recent Labs Lab 01/20/14 0335 01/21/14 0315 01/22/14 0227  WBC 17.9* 15.4* 11.9*  HGB 11.4* 11.4* 11.6*  HCT 35.5* 35.7* 36.2  PLT 206 249 280    Recent Labs Lab 01/18/14 1615  01/20/14 0335 01/21/14 0315 01/22/14 0227  NA 138  < > 143 139 142  K 3.3*  < > 3.4* 3.8 3.6*  CL 95*  < > 107 105 108  CO2 25  < > BUN 16  < > 16 25* 31*  CREATININE 0.94  < > 0.74 0.66 0.83  CALCIUM 9.6  < > 8.5 8.4 8.5  PROT 8.6*  --   --   --   --   BILITOT 0.7  --   --   --   --   ALKPHOS 105  --   --   --   --   ALT 25  --   --   --   --   AST 33  --   --   --   --   GLUCOSE 185*  < > 118* 153* 127*  < > = values in this interval not displayed. POC Troponin - 0.63  Cardiac Panel (last 3 results)  Recent Labs  01/20/14 1222  TROPONINI 1.52*   Lactic acid - 3.27  CSF - >600 protein, <2 glucose, WBC 11321 (90% neutrophils).  Micro: Blood cx (9/18)- gram positive cocci; Strep pneumonia CSF cx (9/18)- WBC present both PMN and mononuclear; no organisms seen Urine cx (9/18)- no growth to date RPR- nonreactive HIV- nonreactive Strep Pneumo urinary antigen- positive  Hemoglobin A1C- 6.3  Imaging/Diagnostic Tests:  Ct Head Wo Contrast 01/18/2014    IMPRESSION: There is slight asymmetry of the  cerebellar peduncles with the left cerebellar peduncle slightly lower in density relative to the right. This may reflect normal variance versus an infarct. If there is further clinical concern recommend MRI.  Otherwise no acute intracranial pathology.    In ED, findings were discussed with Neurologist Dr. Roseanne Reno.  He felt that this was artifact. Recommended MRI.  Dg Chest Port 1 View 01/18/2014   CLINICAL DATA:  Altered mental status  EXAM: PORTABLE CHEST - 1 VIEW  COMPARISON:  10/11/2012  FINDINGS: Hypoventilation with bibasilar atelectasis. Mild cardiac enlargement without heart failure or effusion.  IMPRESSION: Hypoventilation with mild bibasilar atelectasis.   Echo- Left ventricle: EF 55-60%; abnormal left ventricular relaxation (grade 1 diastolic dysfunction).  01/21/2014:  - MRI Head- Diffusion and other more subtle signal abnormalities in the brain, new compared to 05/17/2013, most suggestive of acute meningitis / ventriculitis in this setting.  It is possible there is a superimposed small right inferior  cerebellar infarct located near an emissary vein. However, favor  instead this is infectious and related to #1.   [address] [place], [place] 01/23/2014, 7:15 AM PGY-1, [facility] Family Medicine FPTS Intern pager: [phone], text pages welcome

## 2014-01-23 NOTE — Progress Notes (Signed)
Inpatient Rehabilitation  We received a screen request for possible CIR consult.  Note that cardiology is awaiting her progression for determining how aggressive to be in evaluation of her cardiac status.  We will await further progress in her therapies and decision on cardiac workup  before requesting PMR consult.  Will keep a watchful eye on her progress.    Weldon Picking PT Inpatient Rehab Admissions Coordinator Cell [phone] Office [phone]

## 2014-01-23 NOTE — Progress Notes (Signed)
Occupational Therapy Evaluation Patient Details Name: [name] [name] MRN: [id number] DOB: [birth year] Today's Date: 01/23/2014    History of Present Illness 63 y.o. female with a PMHx of hypertension, hyperlipidemia, history of cigarette smoking, who was admitted to [facility] on 01/18/2014 for evaluation of altered mental status. She was diagnosed as having meningitis   Clinical Impression   PTA, pt lived alone and was independent with ADL and mobility. Very active per friends. Pt currently requires total A +2 with mobility and total A with ADL. Per family. Pt did not sleep last night and was lethargic during evaluation. At this time, if pt progresses, recommend CIR; however, unsure of available support. Pt will benefit from skilled OT services to facilitate D/C to next venue due to below deficits.    Follow Up Recommendations  CIR;Supervision/Assistance - 24 hour    Equipment Recommendations  3 in 1 bedside comode;Tub/shower bench    Recommendations for Other Services Rehab consult     Precautions / Restrictions Precautions Precautions: Fall      Mobility Bed Mobility Overal bed mobility: +2 for physical assistance;Needs Assistance Bed Mobility: Sit to Supine       Sit to supine: Total assist;+2 for physical assistance   General bed mobility comments: Pt initiating sit - sdielying  Transfers Overall transfer level: Needs assistance   Transfers: Sit to/from Stand Sit to Stand: Total assist;+2 physical assistance         General transfer comment: using "bariatric technique with gait belt and Stedy    Balance Overall balance assessment: Needs assistance Sitting-balance support: Feet supported;Bilateral upper extremity supported Sitting balance-Leahy Scale: Zero   Postural control: Posterior lean Standing balance support: During functional activity;Bilateral upper extremity supported Standing balance-Leahy Scale: Zero                              ADL  Overall ADL's : Needs assistance/impaired Eating/Feeding: Maximal assistance Eating/Feeding Details (indicate cue type and reason): family trying to feed pt. Educated family/friends on not trying to feed pt when she is so lethargic due to risk of aspiration. Friends verbalized understanding. Encouraged family to "assist" pt by having pt sit completely upright and have pt try to feed self and hold cup, etc. Grooming: Maximal assistance;Sitting (supported)   Upper Body Bathing: Total assistance;Bed level   Lower Body Bathing: Total assistance;Bed level   Upper Body Dressing : Total assistance;Sitting   Lower Body Dressing: Total assistance;Bed level   Toilet Transfer: +2 for physical assistance;+2 for safety/equipment;Stand-pivot;Total assistance Toilet Transfer Details (indicate cue type and reason): use of Stedy Toileting- Clothing Manipulation and Hygiene: Total assistance;+2 for physical assistance       Functional mobility during ADLs: Total assistance;+2 for physical assistance (with use of Stedy) General ADL Comments: Per PT, pt participating at beginning of session. Pt easily fatigued and became less able to participate.     Vision                 Additional Comments: will assess   Perception Perception Comments: will assess   Praxis Praxis Praxis tested?: Deficits Deficits: Initiation (> 10 second delay)    Pertinent Vitals/Pain Pain Assessment: No/denies pain     Hand Dominance Right   Extremity/Trunk Assessment Upper Extremity Assessment Upper Extremity Assessment: RUE deficits/detail;LUE deficits/detail RUE Deficits / Details: difficulty lifting BUE against gravity due to apparent weakness.Able to grasp bar of lift. Able to grasp cup. difficulty with achieving hand to  mouth pattern RUE Coordination: decreased fine motor;decreased gross motor LUE Coordination: decreased fine motor;decreased gross motor   Lower Extremity Assessment Lower Extremity  Assessment: Defer to PT evaluation   Cervical / Trunk Assessment Cervical / Trunk Assessment: Kyphotic;Other exceptions (unable to maintain midline)   Communication Communication Communication: Expressive difficulties;Other (comment) (most likely due to lethargy)   Cognition Arousal/Alertness: Lethargic Behavior During Therapy: Flat affect Overall Cognitive Status: Impaired/Different from baseline Area of Impairment: Orientation;Attention;Memory;Following commands;Awareness;Safety/judgement;Problem solving Orientation Level: Disoriented to;Place;Time;Situation Current Attention Level: Focused Memory: Decreased short-term memory Following Commands: Follows one step commands inconsistently;Follows one step commands with increased time Safety/Judgement: Decreased awareness of safety;Decreased awareness of deficits   Problem Solving: Slow processing;Decreased initiation;Difficulty sequencing;Requires verbal cues;Requires tactile cues General Comments: greater than 10 second delay in processing. very lethargic. Family states pt did not sleep well last night; had been on [facility] for @ 20 minutes prior to entering room   General Comments       Exercises       Shoulder Instructions      Home Living Family/patient expects to be discharged to:: Inpatient rehab Living Arrangements: Alone                                      Prior Functioning/Environment Level of Independence: Independent             OT Diagnosis: Generalized weakness;Cognitive deficits;Altered mental status   OT Problem List: Decreased strength;Decreased range of motion;Decreased activity tolerance;Impaired balance (sitting and/or standing);Decreased coordination;Decreased cognition;Decreased safety awareness;Decreased knowledge of use of DME or AE;Impaired tone;Obesity;Impaired UE functional use   OT Treatment/Interventions: Self-care/ADL training;Therapeutic exercise;DME and/or AE  instruction;Therapeutic activities;Cognitive remediation/compensation;Patient/family education;Balance training    OT Goals(Current goals can be found in the care plan section) Acute Rehab OT Goals Patient Stated Goal: unable to state OT Goal Formulation: With family Time For Goal Achievement: 02/06/14 Potential to Achieve Goals: Good  OT Frequency: Min 2X/week   Barriers to D/C: Other (comment) (unsure of available support)          Co-evaluation PT/OT/SLP Co-Evaluation/Treatment: Yes Reason for Co-Treatment: Complexity of the patient's impairments (multi-system involvement);Necessary to address cognition/behavior during functional activity;For patient/therapist safety   OT goals addressed during session: ADL's and self-care      End of Session Equipment Utilized During Treatment: Gait belt;Other (comment) Antony Salmon) Nurse Communication: Mobility status;Need for lift equipment (need to use maximove at this time)  Activity Tolerance: Patient limited by lethargy;Patient limited by fatigue Patient left: in bed;with call bell/phone within reach;with family/visitor present   Time: 8119-1478 OT Time Calculation (min): 17 min Charges:  OT General Charges $OT Visit: 1 Procedure OT Evaluation $Initial OT Evaluation Tier I: 1 Procedure OT Treatments $Self Care/Home Management : 8-22 mins G-Codes:    Jayliana Valencia,HILLARY February 22, 2014, 12:31 PM   [facility], OTR/L  [phone] 22-Feb-2014

## 2014-01-23 NOTE — Consult Note (Addendum)
NEURO HOSPITALIST CONSULT NOTE    Reason for Consult: Decreased hearing, double vision and right arm weakness.   HPI:                                                                                                                                          [name] [name] is an 63 y.o. female who was admitted to the hospital secondary to AMS and found to have bacterial meningitis. Patient was initially placed on Vancomycin, CTX, Amp but later changed to Vancomycin and CTX.  Currently only on Ceftriaxone. Per family patient was admitted on Friday.  On admission she was not coherent and extremely lethargic. On Monday when she was more awake they noted she was complaining of double vision at times.  The double vision was more noticeable when looking at objects and people far away. Family also noted she had decreased hearing compared to usual. (that said, her sister states she often asks people to repeat themselves prior to being admitted and feels she will zone out and not listen to what people are stating). Over the last few days family also feels she is weaker on the right arm and seems to not use the arm as much as usual.  When patient is asked about these symptoms, she denies right arm weakness but feels generally weak all over.  She stats her hearing might be a little decreased but is not "really sure if it has worsened." As far as her double vision, she admits to having double vision but states this is only intermittent.  She denies any past injury to her right shoulder or arm, denies ringing or rushing sounds in her ears, denies prior episodes of double vision in the past.    Past Medical History  Diagnosis Date  . Cataract   . Depression   . Anxiety   . HTN (hypertension)   . Hyperlipemia     Past Surgical History  Procedure Laterality Date  . Cyst removal bilateral wrists      Family History  Problem Relation Age of Onset  . Heart attack Mother 50  . Stroke Mother    . Hypertension Mother   . Heart attack Father 58     Social History:  reports that she quit smoking about 2 years ago. Her smoking use included Cigarettes. She has a 20 pack-year smoking history. She does not have any smokeless tobacco history on file. She reports that she does not drink alcohol or use illicit drugs.  No Known Allergies  MEDICATIONS:  Prior to Admission:  Prescriptions prior to admission  Medication Sig Dispense Refill  . aspirin 81 MG chewable tablet Chew 81 mg by mouth daily.      . diphenhydramine-acetaminophen (TYLENOL PM) 25-500 MG TABS Take 2 tablets by mouth at bedtime as needed (for sleep).      Marland Kitchen escitalopram (LEXAPRO) 10 MG tablet Take 10 mg by mouth daily.      Marland Kitchen lisinopril (PRINIVIL,ZESTRIL) 20 MG tablet Take 20 mg by mouth daily.      . Multiple Vitamins-Minerals (MULTIVITAMIN PO) Take 1 tablet by mouth daily.      . simvastatin (ZOCOR) 20 MG tablet Take 20 mg by mouth daily.       Scheduled: . antiseptic oral rinse  7 mL Mouth Rinse BID  . aspirin  81 mg Oral Daily  . carvedilol  3.125 mg Oral BID WC  . cefTRIAXone (ROCEPHIN)  IV  2 g Intravenous Q12H  . feeding supplement (ENSURE COMPLETE)  237 mL Oral BID BM  . heparin  5,000 Units Subcutaneous 3 times per day  . [START ON 01/24/2014] lisinopril  40 mg Oral Daily  . mupirocin cream   Topical TID  . senna  1 tablet Oral Daily     ROS:                                                                                                                                       History obtained from the patient and and family  General ROS: negative for - chills, fatigue, fever, night sweats, weight gain or weight loss Psychological ROS: negative for - behavioral disorder, hallucinations, memory difficulties, mood swings or suicidal ideation Ophthalmic ROS: positive for -  double vision, ENT  ROS: negative for - epistaxis, nasal discharge, oral lesions, sore throat, tinnitus or vertigo Allergy and Immunology ROS: negative for - hives or itchy/watery eyes Hematological and Lymphatic ROS: negative for - bleeding problems, bruising or swollen lymph nodes Endocrine ROS: negative for - galactorrhea, hair pattern changes, polydipsia/polyuria or temperature intolerance Respiratory ROS: negative for - cough, hemoptysis, shortness of breath or wheezing Cardiovascular ROS: negative for - chest pain, dyspnea on exertion, edema or irregular heartbeat Gastrointestinal ROS: negative for - abdominal pain, diarrhea, hematemesis, nausea/vomiting or stool incontinence Genito-Urinary ROS: negative for - dysuria, hematuria, incontinence or urinary frequency/urgency Musculoskeletal ROS: negative for - joint swelling or muscular weakness Neurological ROS: as noted in HPI Dermatological ROS: negative for rash and skin lesion changes   Blood pressure 145/86, pulse 67, temperature 98.7 F (37.1 C), temperature source Oral, resp. rate 17, height 5' 6.54" (1.69 m), weight 113.7 kg (250 lb 10.6 oz), SpO2 98.00%.   Neurologic Examination:  General: Generally fatigued Mental Status: Alert, oriented, thought content appropriate.  Speech fluent without evidence of aphasia.  Able to follow 3 step commands without difficulty. Cranial Nerves: II: Discs flat bilaterally; Visual fields grossly normal, pupils equal, round, reactive to light and accommodation III,IV, VI: ptosis not present, extra-ocular motions intact bilaterally V,VII: smile symmetric, facial light touch sensation normal bilaterally VIII: had difficulty hearing me when speaking in low voice,  BC<AC, Webber test normal IX,X: gag reflex present XI: bilateral shoulder shrug XII: midline tongue extension without atrophy or  fasciculations  Motor: Right : Upper extremity   4/5    Left:     Upper extremity   4/5  Lower extremity   5/5     Lower extremity   5/5 Tone and bulk:normal tone throughout; no atrophy noted Sensory: Pinprick and light touch intact throughout, bilaterally Deep Tendon Reflexes:  Right: Upper Extremity   Left: Upper extremity   biceps (C-5 to C-6) 2/4   biceps (C-5 to C-6) 2/4 tricep (C7) 2/4    triceps (C7) 2/4 Brachioradialis (C6) 2/4  Brachioradialis (C6) 2/4  Lower Extremity Lower Extremity  quadriceps (L-2 to L-4) 2/4   quadriceps (L-2 to L-4) 2/4 Achilles (S1) 1/4   Achilles (S1) 1/4  Plantars: Right: downgoing   Left: up going Cerebellar: normal finger-to-nose,  Normal heel-to-shin CV: pulses palpable throughout    Lab Results: Basic Metabolic Panel:  Recent Labs Lab 01/19/14 0253 01/20/14 0335 01/21/14 0315 01/22/14 0227 01/23/14 1220  NA 139 143 139 142 139  K 3.7 3.4* 3.8 3.6* 3.8  CL 105 107 105 108 106  CO2 17* 21 20 23 19   GLUCOSE 122* 118* 153* 127* 128*  BUN 16 16 25* 31* 22  CREATININE 0.67 0.74 0.66 0.83 0.57  CALCIUM 8.1* 8.5 8.4 8.5 8.6    Liver Function Tests:  Recent Labs Lab 01/18/14 1615  AST 33  ALT 25  ALKPHOS 105  BILITOT 0.7  PROT 8.6*  ALBUMIN 3.9   No results found for this basename: LIPASE, AMYLASE,  in the last 168 hours  Recent Labs Lab 01/18/14 1939  AMMONIA 47    CBC:  Recent Labs Lab 01/18/14 1615 01/19/14 0253 01/20/14 0335 01/20/14 1222 01/21/14 0315 01/22/14 0227 01/23/14 1220  WBC 13.9* 16.7* 17.9*  --  15.4* 11.9* 13.9*  NEUTROABS 12.8*  --   --  17.0*  --   --   --   HGB 13.6 11.8* 11.4*  --  11.4* 11.6* 13.5  HCT 43.2 37.2 35.5*  --  35.7* 36.2 41.4  MCV 82.1 80.0 80.3  --  79.7 80.1 78.9  PLT 232 213 206  --  249 280 319    Cardiac Enzymes:  Recent Labs Lab 01/18/14 2338 01/19/14 0253 01/19/14 1320 01/20/14 1222  TROPONINI 2.83* 3.27* 5.31* 1.52*    Lipid Panel: No results found  for this basename: CHOL, TRIG, HDL, CHOLHDL, VLDL, LDLCALC,  in the last 168 hours  CBG:  Recent Labs Lab 01/18/14 1557 01/19/14 0024  GLUCAP 188* 109*    Microbiology: Results for orders placed during the hospital encounter of 01/18/14  URINE CULTURE     Status: None   Collection Time    01/18/14  4:32 PM      Result Value Ref Range Status   Specimen Description URINE, CATHETERIZED   Final   Special Requests NONE   Final   Culture  Setup Time     Final   Value: 01/18/2014  17:43     Performed at [facility] Count     Final   Value: NO GROWTH     Performed at [facility]   Culture     Final   Value: NO GROWTH     Performed at [facility]   Report Status 01/19/2014 FINAL   Final  CULTURE, BLOOD (ROUTINE X 2)     Status: None   Collection Time    01/18/14  4:49 PM      Result Value Ref Range Status   Specimen Description BLOOD ARM RIGHT   Final   Special Requests BOTTLES DRAWN AEROBIC AND ANAEROBIC 5CC   Final   Culture  Setup Time     Final   Value: 01/18/2014 21:55     Performed at [facility]   Culture     Final   Value: STREPTOCOCCUS PNEUMONIAE     Note: Gram Stain Report Called to,Read Back By and Verified With: CHRIS A@ 1034 ON [id number] BY [facility]     Performed at [facility]   Report Status 01/21/2014 FINAL   Final   Organism ID, Bacteria STREPTOCOCCUS PNEUMONIAE   Final  CULTURE, BLOOD (ROUTINE X 2)     Status: None   Collection Time    01/18/14  5:01 PM      Result Value Ref Range Status   Specimen Description BLOOD HAND RIGHT   Final   Special Requests BOTTLES DRAWN AEROBIC AND ANAEROBIC 4CC   Final   Culture  Setup Time     Final   Value: 01/18/2014 21:57     Performed at [facility]   Culture     Final   Value: STREPTOCOCCUS PNEUMONIAE     Note: SUSCEPTIBILITIES PERFORMED ON PREVIOUS CULTURE WITHIN THE LAST 5 DAYS.     Note: Gram Stain Report Called to,Read Back By and Verified With: CHRIS A@  1034 ON [id number] BY [facility]     Performed at [facility]   Report Status 01/21/2014 FINAL   Final  CSF CULTURE     Status: None   Collection Time    01/18/14  9:20 PM      Result Value Ref Range Status   Specimen Description CSF   Final   Special Requests NO 2 2CC   Final   Gram Stain     Final   Value: WBC PRESENT,BOTH PMN AND MONONUCLEAR     NO ORGANISMS SEEN     CYTOSPIN Performed at [facility]     Performed at [facility]   Culture     Final   Value: NO GROWTH 3 DAYS     Performed at [facility]   Report Status 01/22/2014 FINAL   Final  GRAM STAIN     Status: None   Collection Time    01/18/14  9:20 PM      Result Value Ref Range Status   Specimen Description CSF   Final   Special Requests NO 2 2CC   Final   Gram Stain     Final   Value: WBC PRESENT,BOTH PMN AND MONONUCLEAR     NO ORGANISMS SEEN     CYTOSPIN SLIDE   Report Status 01/18/2014 FINAL   Final  VIRAL CULTURE VIRC     Status: None   Collection Time    01/18/14  9:20 PM      Result Value Ref Range Status  Specimen Description CSF   Final   Special Requests NONE   Final   Culture     Final   Value: No Herpes Simplex Detected in Cell Culture     Performed at [facility]   Report Status 01/23/2014 FINAL   Final  MRSA PCR SCREENING     Status: None   Collection Time    01/18/14 10:18 PM      Result Value Ref Range Status   MRSA by PCR NEGATIVE  NEGATIVE Final   Comment:            The GeneXpert MRSA Assay (FDA     approved for NASAL specimens     only), is one component of a     comprehensive MRSA colonization     surveillance program. It is not     intended to diagnose MRSA     infection nor to guide or     monitor treatment for     MRSA infections.  CULTURE, BLOOD (ROUTINE X 2)     Status: None   Collection Time    01/20/14 12:22 PM      Result Value Ref Range Status   Specimen Description BLOOD RIGHT ARM   Final   Special Requests BOTTLES DRAWN AEROBIC  ONLY 8CC   Final   Culture  Setup Time     Final   Value: 01/20/2014 18:15     Performed at [facility]   Culture     Final   Value:        BLOOD CULTURE RECEIVED NO GROWTH TO DATE CULTURE WILL BE HELD FOR 5 DAYS BEFORE ISSUING A FINAL NEGATIVE REPORT     Performed at [facility]   Report Status PENDING   Incomplete  CULTURE, BLOOD (ROUTINE X 2)     Status: None   Collection Time    01/20/14  2:10 PM      Result Value Ref Range Status   Specimen Description BLOOD RIGHT ARM   Final   Special Requests BOTTLES DRAWN AEROBIC AND ANAEROBIC 5CC   Final   Culture  Setup Time     Final   Value: 01/20/2014 18:14     Performed at [facility]   Culture     Final   Value:        BLOOD CULTURE RECEIVED NO GROWTH TO DATE CULTURE WILL BE HELD FOR 5 DAYS BEFORE ISSUING A FINAL NEGATIVE REPORT     Performed at [facility]   Report Status PENDING   Incomplete    Coagulation Studies: No results found for this basename: LABPROT, INR,  in the last 72 hours  Imaging: MRI of brain without contrast (01/21/2014):  IMPRESSION:  1. Diffusion and other more subtle signal abnormalities in the  brain, new compared to 05/17/2013, most suggestive of acute  meningitis / ventriculitis in this setting.  2. It is possible there is a superimposed small right inferior  cerebellar infarct located near an emissary vein. However, favor  instead this is infectious and related to #1.  3. No intracranial mass effect or ventriculomegaly.  4. Negative intracranial MRA, except for ICA small 2 mm superior  hypophyseal aneurysms or infundibula.     Felicie Morn PA-C [facility] [phone]  01/23/2014, 5:24 PM  Patient seen and examined.  Clinical course and management discussed.  Necessary edits performed.  I agree with the above.  Assessment and plan of care developed and discussed  below.     Assessment/Plan: 63 year old female diagnosed with meningitis.  MRI of the  brain reviewed and is consistent with meningitis and coexistent ventriculitis.  Contrast was not administered for better visualization of the meninges.  Has improved since initiation of antibiotics but has complaints of diplopia, hearing loss and right arm weakness.  This is likely secondary to meningeal involvement of her infection.  These symptoms can be commonly seen in meningitis.  There is no way to determine at this point if they will further improve or if they are indicative of permanent neurological damage which can occur with meningitis as well.  This was explained to family.  Patient on appropriate antibiotics and on Decadron as well.    Recommendations: 1.  Agree with current treatment.  No further neurological intervention recommended at this time.    Thana Farr, MD [facility] [phone]  01/23/2014  5:26 PM

## 2014-01-24 DIAGNOSIS — B002 Herpesviral gingivostomatitis and pharyngotonsillitis: Secondary | ICD-10-CM

## 2014-01-24 DIAGNOSIS — G8250 Quadriplegia, unspecified: Secondary | ICD-10-CM

## 2014-01-24 DIAGNOSIS — A403 Sepsis due to Streptococcus pneumoniae: Principal | ICD-10-CM

## 2014-01-24 LAB — BASIC METABOLIC PANEL
Anion gap: 14 (ref 5–15)
BUN: 28 mg/dL — ABNORMAL HIGH (ref 6–23)
CALCIUM: 8.6 mg/dL (ref 8.4–10.5)
CO2: 21 mEq/L (ref 19–32)
CREATININE: 0.66 mg/dL (ref 0.50–1.10)
Chloride: 106 mEq/L (ref 96–112)
GFR calc Af Amer: >90 mL/min (ref >90)
GLUCOSE: 99 mg/dL (ref 70–99)
Potassium: 5.1 mEq/L (ref 3.7–5.3)
SODIUM: 141 meq/L (ref 137–147)

## 2014-01-24 LAB — CBC
HEMATOCRIT: 43.6 % (ref 36.0–46.0)
Hemoglobin: 13.9 g/dL (ref 12.0–15.0)
MCH: 25.4 pg — ABNORMAL LOW (ref 26.0–34.0)
MCHC: 31.9 g/dL (ref 30.0–36.0)
MCV: 79.7 fL (ref 78.0–100.0)
PLATELETS: 296 10*3/uL (ref 150–400)
RBC: 5.47 MIL/uL — ABNORMAL HIGH (ref 3.87–5.11)
RDW: 13.1 % (ref 11.5–15.5)
WBC: 13.9 10*3/uL — AB (ref 4.0–10.5)

## 2014-01-24 MED ORDER — VALACYCLOVIR HCL 1 G PO TABS
1000.0000 mg | ORAL_TABLET | Freq: Three times a day (TID) | ORAL | Status: AC
Start: 2014-01-24 — End: 2014-01-30

## 2014-01-24 MED ORDER — LISINOPRIL 20 MG PO TABS
20.0000 mg | ORAL_TABLET | Freq: Every day | ORAL | Status: DC
  Administered 2014-01-24 – 2014-01-29 (×6): 20 mg via ORAL
  Filled 2014-01-24 (×6): qty 1

## 2014-01-24 MED ORDER — VITAMINS A & D EX OINT
TOPICAL_OINTMENT | CUTANEOUS | Status: DC | PRN
  Administered 2014-01-26: 21:00:00 via TOPICAL
  Filled 2014-01-24: qty 5

## 2014-01-24 MED ORDER — SENNA 8.6 MG PO TABS: 1.0000 | ORAL_TABLET | Freq: Every day | ORAL | Status: DC

## 2014-01-24 MED ORDER — DEXTROSE 5 % IV SOLN: 2.0000 g | Freq: Two times a day (BID) | INTRAVENOUS | Status: AC

## 2014-01-24 MED ORDER — VALACYCLOVIR HCL 500 MG PO TABS
1000.0000 mg | ORAL_TABLET | Freq: Three times a day (TID) | ORAL | Status: DC
  Administered 2014-01-24 – 2014-01-29 (×16): 1000 mg via ORAL
  Filled 2014-01-24 (×19): qty 2

## 2014-01-24 MED ORDER — SIMVASTATIN 20 MG PO TABS
20.0000 mg | ORAL_TABLET | Freq: Every day | ORAL | Status: DC
  Administered 2014-01-24 – 2014-01-28 (×5): 20 mg via ORAL
  Filled 2014-01-24 (×6): qty 1

## 2014-01-24 MED ORDER — CARVEDILOL 3.125 MG PO TABS: 3.1250 mg | ORAL_TABLET | Freq: Two times a day (BID) | ORAL | Status: DC

## 2014-01-24 MED ORDER — VITAMINS A & D EX OINT: TOPICAL_OINTMENT | CUTANEOUS | Status: DC | PRN

## 2014-01-24 MED ORDER — NYSTATIN 100000 UNIT/ML MT SUSP: 5.0000 mL | Freq: Four times a day (QID) | OROMUCOSAL | Status: DC

## 2014-01-24 MED ORDER — ESCITALOPRAM OXALATE 10 MG PO TABS
10.0000 mg | ORAL_TABLET | Freq: Every day | ORAL | Status: DC
  Administered 2014-01-24 – 2014-01-29 (×6): 10 mg via ORAL
  Filled 2014-01-24 (×6): qty 1

## 2014-01-24 MED ORDER — NYSTATIN 100000 UNIT/ML MT SUSP
5.0000 mL | Freq: Four times a day (QID) | OROMUCOSAL | Status: DC
  Administered 2014-01-24 – 2014-01-26 (×9): 500000 [IU] via ORAL
  Filled 2014-01-24 (×12): qty 5

## 2014-01-24 NOTE — Discharge Summary (Signed)
[facility] Discharge Summary  Patient name: [name] [name] Medical record number: [id number] Date of birth: August 30, 1950 Age: 63 y.o. Gender: female Date of Admission: 01/18/2014  Date of Discharge: 01/29/14 Admitting Physician: Andrena Mews, MD  Primary Care Provider: Leandrew Koyanagi, MD Consultants: Infectious Disease, Cardiology, Neurology  Indication for Hospitalization: Bacterial Meningitis  Discharge Diagnoses/Problem List:  Bacterial Meningitis Sepsis secondary to Streptococcus Pneumonia Hypertension Elevated Troponin Depression/Anxiety HSV Stomatitis Thrush Hyperlipidemia  Disposition: Discharge to CIR  Discharge Condition: Stable  Brief Hospital Course:  Mrs. Alviar is a 63yo female with history of depression, anxiety, HTN, and hyperlipidemia who presented to the ED on 01/18/14 with altered mental status and history of headache for one week.  She met SIRS criteria at presentation with tachycardia of 114, fever of 101.3, and leukocytosis of 13.9.  CT of the head showed possible infarct vs. normal variant and neurology recommended MRI.  CXR and UA were clear. Lactic Acid elevated at 5.69. Flu PCR was negative.Tylenol and salicylate levels were normal. UDS and ethanol level was normal. Ammonia level was normal. TSH normal. Initial troponin was negative and Cardiology was consulted; believed to be elevated secondary to sepsis. ABG showed respiratory alkalosis.  Lumbar puncture showed >600protein, <2 glucose, and WBC 11321 (90% neutrophils), which is consistent with bacterial meningitis. She was admitted to the Step Down Unit by the [facility] Medicine Teaching Service.  Empiric coverage was initiated on 9/18 with Vancomycin, Zosyn, and Acyclovir.  Infectious Disease was consulted and antibiotic regimen was changed to Vancomycin, Ceftriaxone, and Ampicillin on 9/19.  Steroids were added on 9/19 and continued for a five day course.  Blood culture showed  Gram positive cocci in pairs and grew Streptococcus pneumonia. Ampicillin was discontinued on 9/20 due to decreased likelihood of Listeria. CSF culture showed WBC present with both PMN and mononuclear, but no organisms were seen.  Urine culture showed no growth to date.  Strep pneumoni urinary antigen was positive.  HIV and RPR were both nonreactive.   Influenza was negative. Culture sensitivities returned on 9/21 showing sensitivity to Ceftriaxone, so Vancomycin was discontinued.  Infectious disease recommended continuing course of Ceftriaxone for 14 days.  Cardiology was consulted concerning elevated troponins of 5.31.  Troponins trended down to 1.52 by end of hospitalization  Due to altered mental status, Mrs. Horsford was not initially a candidate for heart catherization. Echo was recommended and showed no signs of murmurs or vegetations, EF of 55-60%, and abnormal left ventricular relaxation concerning for grade 1 diastolic dysfunction.  Recommend Myoview as an outpatient.  Mrs. Emanuele had a speech evaluation on 9/21 and she was paced on a finely chopped diet with thin liquids.  MRI on 9/22 showed diffusion and more subtle signal abnormalities in the brain suggestive and acute meningitis/ventriculitis. Follow up MRI on 9/27 showed findings consistent with known meningitis and no signs of infarct. Home lisinopril was initiated on 9/23.  Coreg was added on 9/24 to further control blood pressure. Her home medication Lexapro 36m was reinitiated on 9/24. Rash appeared around mouth up to right nostril concerning for Impetigo vs. HSV Stomatitis.  Initiated on Mupirocin on 9/23, but this was discontinued on 9/24 when it was determined stomatitis was more likely and Mrs. Chura was started on Valtrex for a seven day course at this time.  Concern for thrush on 9/24 so initiated on Nystatin mouthwash.  Mrs. Volante was evaluated by Inpatient Rehab on 9/24 and then again on 9/28 and was determined to  be an appropriate  candidate for Inpatient Rehab.  Issues for Follow Up:  - Cardiology recommends Myoview as an outpatient. - Initiated on Coreg 3.186m.  If BP is still elevated, consider increasing dose of Lisinopril vs. Coreg. - Continue Ceftriaxone until 10/1 - Discharged on seven day course of Valtrex for HSV stomatitis. Last dose on 9/30. - A1C 6.3.  Counsel on diet and exercise to prevent progression into diabetes. - Debrox given for cerumen in left ear.   Significant Procedures: None  Significant Labs and Imaging:   Recent Labs Lab 01/24/14 0425 01/26/14 0825 01/28/14 1118  WBC 13.9* 10.0 7.1  HGB 13.9 12.2 11.8*  HCT 43.6 37.7 37.2  PLT 296 245 278    Recent Labs Lab 01/23/14 1220 01/24/14 0425 01/26/14 0825 01/28/14 1118  NA 139 141 137 134*  K 3.8 5.1 4.1 4.4  CL 106 106 100 101  CO2 19 21 26 24   GLUCOSE 128* 99 97 104*  BUN 22 28* 22 18  CREATININE 0.57 0.66 0.68 0.62  CALCIUM 8.6 8.6 8.3* 8.4   Troponin: 2.83>3.27>5.31>1.52 Ammonia- 47 Lactic Acid- 3.59 A1C 6.3 CSF - >600 protein, <2 glucose, WBC 11321 (90% neutrophils).  Micro:   -Blood cx (9/18)- gram positive cocci; Strep pneumonia   -CSF cx (9/18)- WBC present both PMN and mononuclear; no organisms seen   -Urine cx (9/18)- no growth to date   -RPR- nonreactive   -HIV- nonreactive   -Strep Pneumo urinary antigen- positive   -Flu negative  -Enterovirus not detected  -HSV not detected Hemoglobin A1C- 6.3   Imaging/Diagnostic Tests:  Ct Head Wo Contrast  01/18/2014   CLINICAL DATA:  Indication a, acting inappropriately  EXAM: CT HEAD WITHOUT CONTRAST  TECHNIQUE: Contiguous axial images were obtained from the base of the skull through the vertex without intravenous contrast.  COMPARISON:  MRI brain 05/17/2013  FINDINGS: There is no evidence of mass effect, midline shift or extra-axial fluid collections. There is no evidence of a space-occupying lesion or intracranial hemorrhage. There is no evidence of a  cortical-based area of acute infarction. There is slight asymmetry of the cerebellar peduncles with the left cerebellar peduncle slightly lower in density relative to the right.  The ventricles and sulci are appropriate for the patient's age. The basal cisterns are patent.  Visualized portions of the orbits are unremarkable. The visualized portions of the paranasal sinuses and mastoid air cells are unremarkable.  The osseous structures are unremarkable.  IMPRESSION: There is slight asymmetry of the cerebellar peduncles with the left cerebellar peduncle slightly lower in density relative to the right. This may reflect normal variance versus an infarct. If there is further clinical concern recommend MRI.  Otherwise no acute intracranial pathology.   Electronically Signed   By: HKathreen Devoid  On: 01/18/2014 17:28   Mr MJodene NamHead Wo Contrast  01/21/2014   ADDENDUM REPORT: 01/21/2014 14:06  ADDENDUM: Study discussed by telephone with House Staff Dr. PJerline PainOn 01/21/2014 at 1354 hours. He acknowledges a clinical diagnosis of Streptococcus pneumoniae sepsis and meningitis.  We discussed repeat brain MRI without and with contrast if the patient does not improve as expected on treatment.   Electronically Signed   By: LLars PinksM.D.   On: 01/21/2014 14:06   01/21/2014   CLINICAL DATA:  63year old female with altered mental status, headache, fever of 101, encephalopathy. Upper respiratory infection prodrome. Initial encounter.  EXAM: MRI HEAD WITHOUT CONTRAST  MRA HEAD WITHOUT CONTRAST  TECHNIQUE: Multiplanar,  multiecho pulse sequences of the brain and surrounding structures were obtained without intravenous contrast. Angiographic images of the head were obtained using MRA technique without contrast.  COMPARISON:  Head CT without contrast 918 1,015. Brain MRI 05/17/2013.  FINDINGS: MRI HEAD FINDINGS  Major intracranial vascular flow voids are stable.  There are multiple scattered small foci of restricted diffusion, seemingly  not corresponding to vascular territories. These include areas around the atria of the lateral ventricles (series 3, image 16) and anterior to both frontal horns (seen on series 4, image 28). Also there is suggestion of numerous small foci of restricted diffusion in the subarachnoid spaces at the vertex (series 3, image 27).  There is also a more confluent small focus of restricted diffusion in the inferior right cerebellar tonsil laterally. However, there was some preexisting T2 hyperintensity in this region previously. This seems to be near a small emissary vein or venous lake in the overlying bone (see series 2, image 11 of the recent CT).  At the same time there is suggestion of some T2 hypo intense material with in the atria of the lateral ventricle, and more convincingly within the chronically enlarged left Meckel cave (series 5, image 7). The latter also shows restricted diffusion (series 3, image 8).  However, there is no convincing cerebral edema. No intracranial mass effect. No acute intracranial hemorrhage identified. Grossly Stable pituitary. Grossly negative visualized cervical spine.  New left greater than right mastoid fluid, sphenoid sinus fluid levels and mucosal thickening, and other paranasal sinus mucosal thickening. Visualized orbit soft tissues are within normal limits. Bone marrow signal is stable and within normal limits. No acute scalp soft tissue finding identified.  MRA HEAD FINDINGS  Antegrade flow in the posterior circulation with codominant distal vertebral arteries. Normal vertebrobasilar junction. Normal left PICA origin. Dominant appearing right AICA. No basilar artery stenosis. SCA and PCA origins are within normal limits. Diminutive posterior communicating arteries. Bilateral PCA branches are within normal limits.  Antegrade flow in both ICA siphons. No siphon stenosis. There are 2 mm or smaller superior hypophyseal infundibula or small aneurysms (e.g. on the right). See series 7,  image 68.  Patent carotid termini. MCA and ACA origins are patent. Motion artifact degrades some detail of the bilateral MCA and ACA branches, which appear to remain within normal limits.  IMPRESSION: 1. Diffusion and other more subtle signal abnormalities in the brain, new compared to 05/17/2013, most suggestive of acute meningitis / ventriculitis in this setting. 2. It is possible there is a superimposed small right inferior cerebellar infarct located near an emissary vein. However, favor instead this is infectious and related to #1. 3. No intracranial mass effect or ventriculomegaly. 4. Negative intracranial MRA, except for ICA small 2 mm superior hypophyseal aneurysms or infundibula.  Electronically Signed: By: Lars Pinks M.D. On: 01/21/2014 13:39   Mr Brain Wo Contrast  01/21/2014   ADDENDUM REPORT: 01/21/2014 14:06  ADDENDUM: Study discussed by telephone with House Staff Dr. Jerline Pain On 01/21/2014 at 1354 hours. He acknowledges a clinical diagnosis of Streptococcus pneumoniae sepsis and meningitis.  We discussed repeat brain MRI without and with contrast if the patient does not improve as expected on treatment.   Electronically Signed   By: Lars Pinks M.D.   On: 01/21/2014 14:06   01/21/2014   CLINICAL DATA:  63 year old female with altered mental status, headache, fever of 101, encephalopathy. Upper respiratory infection prodrome. Initial encounter.  EXAM: MRI HEAD WITHOUT CONTRAST  MRA HEAD WITHOUT CONTRAST  TECHNIQUE: Multiplanar, multiecho pulse sequences of the brain and surrounding structures were obtained without intravenous contrast. Angiographic images of the head were obtained using MRA technique without contrast.  COMPARISON:  Head CT without contrast 918 1,015. Brain MRI 05/17/2013.  FINDINGS: MRI HEAD FINDINGS  Major intracranial vascular flow voids are stable.  There are multiple scattered small foci of restricted diffusion, seemingly not corresponding to vascular territories. These include areas  around the atria of the lateral ventricles (series 3, image 16) and anterior to both frontal horns (seen on series 4, image 28). Also there is suggestion of numerous small foci of restricted diffusion in the subarachnoid spaces at the vertex (series 3, image 27).  There is also a more confluent small focus of restricted diffusion in the inferior right cerebellar tonsil laterally. However, there was some preexisting T2 hyperintensity in this region previously. This seems to be near a small emissary vein or venous lake in the overlying bone (see series 2, image 11 of the recent CT).  At the same time there is suggestion of some T2 hypo intense material with in the atria of the lateral ventricle, and more convincingly within the chronically enlarged left Meckel cave (series 5, image 7). The latter also shows restricted diffusion (series 3, image 8).  However, there is no convincing cerebral edema. No intracranial mass effect. No acute intracranial hemorrhage identified. Grossly Stable pituitary. Grossly negative visualized cervical spine.  New left greater than right mastoid fluid, sphenoid sinus fluid levels and mucosal thickening, and other paranasal sinus mucosal thickening. Visualized orbit soft tissues are within normal limits. Bone marrow signal is stable and within normal limits. No acute scalp soft tissue finding identified.  MRA HEAD FINDINGS  Antegrade flow in the posterior circulation with codominant distal vertebral arteries. Normal vertebrobasilar junction. Normal left PICA origin. Dominant appearing right AICA. No basilar artery stenosis. SCA and PCA origins are within normal limits. Diminutive posterior communicating arteries. Bilateral PCA branches are within normal limits.  Antegrade flow in both ICA siphons. No siphon stenosis. There are 2 mm or smaller superior hypophyseal infundibula or small aneurysms (e.g. on the right). See series 7, image 68.  Patent carotid termini. MCA and ACA origins are  patent. Motion artifact degrades some detail of the bilateral MCA and ACA branches, which appear to remain within normal limits.  IMPRESSION: 1. Diffusion and other more subtle signal abnormalities in the brain, new compared to 05/17/2013, most suggestive of acute meningitis / ventriculitis in this setting. 2. It is possible there is a superimposed small right inferior cerebellar infarct located near an emissary vein. However, favor instead this is infectious and related to #1. 3. No intracranial mass effect or ventriculomegaly. 4. Negative intracranial MRA, except for ICA small 2 mm superior hypophyseal aneurysms or infundibula.  Electronically Signed: By: Lars Pinks M.D. On: 01/21/2014 13:39   Mr Jeri Cos EP Contrast  01/27/2014   CLINICAL DATA:  Acute encephalopathy secondary to sepsis from bacterial meningitis.  EXAM: MRI HEAD WITHOUT AND WITH CONTRAST  TECHNIQUE: Multiplanar, multiecho pulse sequences of the brain and surrounding structures were obtained without and with intravenous contrast.  CONTRAST:  73m MULTIHANCE GADOBENATE DIMEGLUMINE 529 MG/ML IV SOLN  COMPARISON:  Noncontrast brain MRI 01/21/2014  FINDINGS: Small, scattered foci of restricted diffusion at the atria of the lateral ventricles and right occipital horn, in the frontal lobes, and in the subarachnoid spaces, greatest at the vertex, overall appear stable to slightly decreased in conspicuity. Small focus of restricted diffusion  in the inferior right cerebellum appears to have essentially resolved. No new areas of parenchymal restricted diffusion are identified.  There is no intracranial hemorrhage, mass, or midline shift. Small, scattered foci of T2 hyperintensity throughout both cerebral hemispheres appear mildly increased from 05/17/2013 and also likely slightly increased from 01/21/2014, although the most recent prior study was degraded by motion. Small focus of T2 hyperintensity in the inferior right cerebellum is unchanged, and minimal  patchy T2 hyperintensity is again seen in the pons.  Small focus of enhancement is seen in the inferior right cerebellum at the site of previously described diffusion-weighted signal abnormality. There is mild, diffuse dural thickening and enhancement. Leptomeningeal enhancement is a less dominant finding, most notable at the vertex. Mild ependymal enhancement is noted involving the atria and occipital horns of the lateral ventricles. There is no brain parenchymal abscess. Ventricles are unchanged in size. [place] are again noted to be enlarged, left greater than right, with some abnormal/complex material again seen on the left.  Orbits are unremarkable. Right sphenoid sinus fluid is noted. There is a is small to moderate-sized left mastoid effusion, similar to prior. Major intracranial vascular flow voids are preserved.  IMPRESSION: Findings consistent with known meningitis. Scattered foci of diffusion restriction are stable to slightly decreased in conspicuity. No evidence of acute infarct or brain abscess.   Electronically Signed   By: Logan Bores   On: 01/27/2014 11:51   Dg Chest Port 1 View  01/18/2014   CLINICAL DATA:  Altered mental status  EXAM: PORTABLE CHEST - 1 VIEW  COMPARISON:  10/11/2012  FINDINGS: Hypoventilation with bibasilar atelectasis. Mild cardiac enlargement without heart failure or effusion.  IMPRESSION: Hypoventilation with mild bibasilar atelectasis.   Electronically Signed   By: Franchot Gallo M.D.   On: 01/18/2014 16:45   Results/Tests Pending at Time of Discharge: Blood Culture  Discharge Medications:    Medication List         aspirin 81 MG chewable tablet  Chew 81 mg by mouth daily.     carvedilol 3.125 MG tablet  Commonly known as:  COREG  Take 1 tablet (3.125 mg total) by mouth 2 (two) times daily with a meal.     cefTRIAXone 2 g in dextrose 5 % 50 mL  Inject 2 g into the vein every 12 (twelve) hours.     diphenhydramine-acetaminophen 25-500 MG Tabs   Commonly known as:  TYLENOL PM  Take 2 tablets by mouth at bedtime as needed (for sleep).     escitalopram 10 MG tablet  Commonly known as:  LEXAPRO  Take 10 mg by mouth daily.     lisinopril 20 MG tablet  Commonly known as:  PRINIVIL,ZESTRIL  Take 20 mg by mouth daily.     MULTIVITAMIN PO  Take 1 tablet by mouth daily.     nystatin 100000 UNIT/ML suspension  Commonly known as:  MYCOSTATIN  Take 5 mLs (500,000 Units total) by mouth 4 (four) times daily.     senna 8.6 MG Tabs tablet  Commonly known as:  SENOKOT  Take 1 tablet (8.6 mg total) by mouth daily.     simvastatin 20 MG tablet  Commonly known as:  ZOCOR  Take 20 mg by mouth daily.     valACYclovir 1000 MG tablet  Commonly known as:  VALTREX  Take 1 tablet (1,000 mg total) by mouth 3 (three) times daily.     vitamin A & D ointment  Apply topically as needed for  dry skin.       Discharge Instructions: Please refer to Patient Instructions section of EMR for full details.  Patient was counseled important signs and symptoms that should prompt return to medical care, changes in medications, dietary instructions, activity restrictions, and follow up appointments.   Follow-Up Appointments: Follow-up Information   Follow up with DOOLITTLE, Linton Ham, MD. (Please follow up upon discharge from Inpatient Rehab for Hospital Follow up)    Specialties:  Internal Medicine, Adolescent Medicine   Contact information:   [place] [address] [phone]       Follow up with Richardson Dopp, PA-C On 03/06/2014. (11:30 am (with Dr. Camillia Herter PA))    Specialty:  Physician Assistant   Contact information:   [address] [place] [address] [phone]      Lorna Few, [place] 01/29/2014, 12:26 PM PGY-1, [facility]

## 2014-01-24 NOTE — Consult Note (Signed)
Physical Medicine and Rehabilitation Consult  Reason for Consult: Bacterial meningitis with mental status changes, RUE weakness, double vision and difficulty walking Referring Physician: Dr. Lum Babe   HPI: [name] [name] is a 63 y.o. female with history of HTN, depression, anxiety disorder who was admitted on 01/18/14 with lethargy, fever 101.3 with leucocytosis and elevated troponin levels. She was started on IV antibiotics for SIRS and blood cultures done positive for strep Pneumo.  LP with elevated protein >600 and WBC 11321. She was started on steroids as well as Vanc, Ceftriaxone and ampicillin for bacterial meningitis.  CT head with slight asymmetry of cerebellar peduncles and Dr. Roseanne Reno recommended MRI brain for work up.  MRI brain with diffusion abnormality around atrial of lateral ventricles and anterior  to both frontal horns suggestive of meningitis and ventriculitis. ID consulted for input and recommends 14 days of IV ceftriaxone  for treatment of Meningitis, ventriculitis  and pneumococcal bacteremia. 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  Cardiology feels that patient with demand ischemia rather than NSTEMI. Patient with normal Myoview in August and 2 D echo with EF 55-60% with no wall abnormality and recommends outpatient Myoview as patient asymptomatic.   Patient is up in a chair but tired and dosing off. Sisters are in the room.   Review of Systems  HENT: Negative for hearing loss.   Eyes: Positive for blurred vision and double vision. Negative for photophobia.  Respiratory: Negative for cough and shortness of breath.   Cardiovascular: Negative for chest pain and palpitations.  Gastrointestinal: Negative for heartburn, nausea and abdominal pain.  Musculoskeletal: Positive for myalgias.  Neurological: Negative for dizziness and headaches.  Psychiatric/Behavioral: Positive for memory loss. The patient is not nervous/anxious.      Past Medical History    Diagnosis Date  . Cataract   . Depression   . Anxiety   . HTN (hypertension)   . Hyperlipemia    Past Surgical History  Procedure Laterality Date  . Cyst removal bilateral wrists     Family History  Problem Relation Age of Onset  . Heart attack Mother 45  . Stroke Mother   . Hypertension Mother   . Heart attack Father 62   Social History:  Lives alone. Has two sisters in town. Retired--used to work for [employer]. She  reports that she quit smoking about 2 years ago. Her smoking use included Cigarettes. She has a 20 pack-year smoking history. She does not have any smokeless tobacco history on file. She reports that she does not drink alcohol or use illicit drugs.   Allergies: No Known Allergies Medications Prior to Admission  Medication Sig Dispense Refill  . aspirin 81 MG chewable tablet Chew 81 mg by mouth daily.      . diphenhydramine-acetaminophen (TYLENOL PM) 25-500 MG TABS Take 2 tablets by mouth at bedtime as needed (for sleep).      Marland Kitchen escitalopram (LEXAPRO) 10 MG tablet Take 10 mg by mouth daily.      Marland Kitchen lisinopril (PRINIVIL,ZESTRIL) 20 MG tablet Take 20 mg by mouth daily.      . Multiple Vitamins-Minerals (MULTIVITAMIN PO) Take 1 tablet by mouth daily.      . simvastatin (ZOCOR) 20 MG tablet Take 20 mg by mouth daily.        Home: Home Living Family/patient expects to be discharged to:: Inpatient rehab Living Arrangements: Alone Available Help at Discharge: Family;Available PRN/intermittently Type of Home: House Home Access: Stairs to enter [employer]  of Steps: 4 Entrance Stairs-Rails: None Home Layout: Multi-level Alternate Level Stairs-Number of Steps: 8 Home Equipment: None  Functional History: Prior Function Level of Independence: Independent Functional Status:  Mobility: Bed Mobility Overal bed mobility: Needs Assistance;+ 2 for safety/equipment Bed Mobility: Supine to Sit Supine to sit: Min assist;+2 for safety/equipment Sit to supine: Total  assist;+2 for physical assistance General bed mobility comments: Pt needed assist to move LEs and for elevation of trunk.  Delayed in following commands.  Pt initiating sit - sdielyingOverall delay in initiation of movement needing incr cues and time to perform activities.  Also fatigued quickly.  Transfers Overall transfer level: Needs assistance Equipment used:  (Stedy used) Transfers: Sit to/from [employer] to Stand: Mod assist;+2 physical assistance;From elevated surface Stand pivot transfers:  (used Stedy) General transfer comment: Pt able to stand with mod assist initially and use and use Stedy to stand but had difficulty standing fully upright until tactilely cues as well as verbally cued.  Pt stood for 35 seconds to put the flaps under her bottom.  Then she rested for 2 minutes and stood again but only for 10 seconds.  On last attempt at stand, moved flaps and pt stood 35 seconds and then sat back onto chair.  Pt fully fatigued at this point and was falling asleep as well as needed max verbal cues to scoot back in chair.  Pt continues with  delayed initiation of all movements to include standing, moving LEs, etc.        ADL: ADL Overall ADL's : Needs assistance/impaired Eating/Feeding: Maximal assistance Eating/Feeding Details (indicate cue type and reason): family trying to feed pt. Educated family/friends on not trying to feed pt when she is so lethargic due to risk of aspiration. Friends verbalized understanding. Encouraged family to "assist" pt by having pt sit completely upright and have pt try to feed self and hold cup, etc. Grooming: Maximal assistance;Sitting (supported) Upper Body Bathing: Total assistance;Bed level Lower Body Bathing: Total assistance;Bed level Upper Body Dressing : Total assistance;Sitting Lower Body Dressing: Total assistance;Bed level Toilet Transfer: +2 for physical assistance;+2 for safety/equipment;Stand-pivot;Total assistance Toilet Transfer Details  (indicate cue type and reason): use of Stedy Toileting- Clothing Manipulation and Hygiene: Total assistance;+2 for physical assistance Functional mobility during ADLs: Total assistance;+2 for physical assistance (with use of Stedy) General ADL Comments: Per PT, pt participating at beginning of session. Pt easily fatigued and became less able to participate.  Cognition: Cognition Overall Cognitive Status: Impaired/Different from baseline Orientation Level: Oriented to person;Oriented to place;Oriented to time;Disoriented to situation Cognition Arousal/Alertness: Lethargic Behavior During Therapy: Flat affect Overall Cognitive Status: Impaired/Different from baseline Area of Impairment: Orientation;Attention;Memory;Following commands;Awareness;Safety/judgement;Problem solving Orientation Level: Disoriented to;Place;Time;Situation Current Attention Level: Focused Memory: Decreased short-term memory Following Commands: Follows one step commands inconsistently;Follows one step commands with increased time Safety/Judgement: Decreased awareness of safety;Decreased awareness of deficits Problem Solving: Slow processing;Decreased initiation;Difficulty sequencing;Requires verbal cues;Requires tactile cues General Comments: greater than 10 second delay in processing. very lethargic. Family states pt did not sleep well last night.  Pt family actually shared that pt has trouble sleeping at night at home and sleeps during day even when home.    Blood pressure 141/69, pulse 67, temperature 97.5 F (36.4 C), temperature source Oral, resp. rate 18, height 5' 6.54" (1.69 m), weight 113.7 kg (250 lb 10.6 oz), SpO2 97.00%. Physical Exam  Nursing note and vitals reviewed. Constitutional: She is oriented to person, place, and time. She appears well-developed and well-nourished.  HENT:  Head: Normocephalic and atraumatic.  Eyes: Conjunctivae are normal. Pupils are equal, round, and reactive to light.  Neck:  Normal range of motion. Neck supple.  Cardiovascular: Normal rate and regular rhythm.   Respiratory: Effort normal and breath sounds normal. No respiratory distress. She has no wheezes.  GI: Soft. Bowel sounds are normal. She exhibits no distension. There is no tenderness.  Musculoskeletal: She exhibits no edema and no tenderness.  Neurological: She is alert and oriented to person, place, and time.  Slow measured speech. Able to follow one and two step commands.  Inconsistent answers. Question sensory deficits BLE.   Skin: Skin is warm and dry.   motor strength is 4/5 bilateral deltoid, biceps, triceps, grip 3 minus bilateral hip flexors knee extensors ankle dorsiflexors and plantar flexors Sensation intact to light touch bilateral upper and lower limbs Due to reflexes 3+ bilateral biceps, triceps, brachioradialis, patellar, 1+ at the ankles  Results for orders placed during the hospital encounter of 01/18/14 (from the past 24 hour(s))  BASIC METABOLIC PANEL     Status: Abnormal   Collection Time    01/24/14  4:25 AM      Result Value Ref Range   Sodium 141  137 - 147 mEq/L   Potassium 5.1  3.7 - 5.3 mEq/L   Chloride 106  96 - 112 mEq/L   CO2 21  19 - 32 mEq/L   Glucose, Bld 99  70 - 99 mg/dL   BUN 28 (*) 6 - 23 mg/dL   Creatinine, Ser 1.61  0.50 - 1.10 mg/dL   Calcium 8.6  8.4 - 09.6 mg/dL   GFR calc non Af Amer >90  >90 mL/min   GFR calc Af Amer >90  >90 mL/min   Anion gap 14  5 - 15  CBC     Status: Abnormal   Collection Time    01/24/14  4:25 AM      Result Value Ref Range   WBC 13.9 (*) 4.0 - 10.5 K/uL   RBC 5.47 (*) 3.87 - 5.11 MIL/uL   Hemoglobin 13.9  12.0 - 15.0 g/dL   HCT 04.5  40.9 - 81.1 %   MCV 79.7  78.0 - 100.0 fL   MCH 25.4 (*) 26.0 - 34.0 pg   MCHC 31.9  30.0 - 36.0 g/dL   RDW 91.4  78.2 - 95.6 %   Platelets 296  150 - 400 K/uL   No results found.  Assessment/Plan: Diagnosis: Bacterial meningitis with decreased balance and lower shimmy strength 1. Does  the need for close, 24 hr/day medical supervision in concert with the patient's rehab needs make it unreasonable for this patient to be served in a less intensive setting? Yes 2. Co-Morbidities requiring supervision/potential complications: Hypertension, demand ischemia, anemia 3. Due to bladder management, bowel management, safety, skin/wound care, disease management, medication administration, pain management and patient education, does the patient require 24 hr/day rehab nursing? Yes 4. Does the patient require coordinated care of a physician, rehab nurse, PT (1-2 hrs/day, 5 days/week), OT (1-2 hrs/day, 5 days/week) and SLP (0.5-1 hrs/day, 5 days/week) to address physical and functional deficits in the context of the above medical diagnosis(es)? Yes Addressing deficits in the following areas: balance, endurance, locomotion, strength, transferring, bathing, dressing, feeding, grooming, toileting and cognition 5. Can the patient actively participate in an intensive therapy program of at least 3 hrs of therapy per day at least 5 days per week? Potentially and May need to 15/7 schedule at first  6. The potential for patient to make measurable gains while on inpatient rehab is good 7. Anticipated functional outcomes upon discharge from inpatient rehab are supervision  with PT, supervision with OT, supervision with SLP. 8. Estimated rehab length of stay to reach the above functional goals is: 14-18 days 9. Does the patient have adequate social supports to accommodate these discharge functional goals? Potentially 10. Anticipated D/C setting: Home 11. Anticipated post D/C treatments: HH therapy 12. Overall Rehab/Functional Prognosis: good  RECOMMENDATIONS: This patient's condition is appropriate for continued rehabilitative care in the following setting: CIR Patient has agreed to participate in recommended program. Yes Note that insurance prior authorization may be required for reimbursement for  recommended care.  Comment:     01/24/2014

## 2014-01-24 NOTE — Progress Notes (Signed)
Physical Therapy Treatment Patient Details Name: [name] [name] MRN: [id number] DOB: 08/03/1950 Today's Date: 01/24/2014    History of Present Illness 63 y.o. female with a PMHx of hypertension, hyperlipidemia, history of cigarette smoking, who was admitted to [facility] on 01/18/2014 for evaluation of altered mental status. She was diagnosed as having meningitis    PT Comments    Pt admitted with above. Pt currently with functional limitations due to lethargy affecting mobility as well as pt with weakness.  Pt did do slightly better today.  Pt will benefit from skilled PT to increase their independence and safety with mobility to allow discharge to the venue listed below.   Follow Up Recommendations  CIR;Supervision/Assistance - 24 hour     Equipment Recommendations  Other (comment) (TBA)    Recommendations for Other Services Rehab consult     Precautions / Restrictions Precautions Precautions: Fall Restrictions Weight Bearing Restrictions: No    Mobility  Bed Mobility Overal bed mobility: Needs Assistance;+ 2 for safety/equipment Bed Mobility: Supine to Sit     Supine to sit: Min assist;+2 for safety/equipment     General bed mobility comments: Pt needed assist to move LEs and for elevation of trunk.  Delayed in following commands.  Pt initiating sit - sdielyingOverall delay in initiation of movement needing incr cues and time to perform activities.  Also fatigued quickly.   Transfers Overall transfer level: Needs assistance Equipment used:  (Stedy used) Transfers: Sit to/from [employer] to Stand: Mod assist;+2 physical assistance;From elevated surface Stand pivot transfers:  (used Stedy)       General transfer comment: Pt able to stand with mod assist initially and use and use Stedy to stand but had difficulty standing fully upright until tactilely cues as well as verbally cued.  Pt stood for 35 seconds to put the flaps under her bottom.  Then she rested for 2 minutes and  stood again but only for 10 seconds.  On last attempt at stand, moved flaps and pt stood 35 seconds and then sat back onto chair.  Pt fully fatigued at this point and was falling asleep as well as needed max verbal cues to scoot back in chair.  Pt continues with  delayed initiation of all movements to include standing, moving LEs, etc.    Ambulation/Gait                 Stairs            Wheelchair Mobility    Modified Rankin (Stroke Patients Only)       Balance Overall balance assessment: Needs assistance;History of Falls Sitting-balance support: Bilateral upper extremity supported;Feet supported Sitting balance-Leahy Scale: Poor Sitting balance - Comments: Needed mod to min assist for sitting EOB for 4 minutes.   Postural control: Posterior lean Standing balance support: Bilateral upper extremity supported;During functional activity Standing balance-Leahy Scale: Poor Standing balance comment: Needed mod assist to stand with STedy.                    Cognition Arousal/Alertness: Lethargic Behavior During Therapy: Flat affect Overall Cognitive Status: Impaired/Different from baseline Area of Impairment: Orientation;Attention;Memory;Following commands;Awareness;Safety/judgement;Problem solving Orientation Level: Disoriented to;Place;Time;Situation Current Attention Level: Focused Memory: Decreased short-term memory Following Commands: Follows one step commands inconsistently;Follows one step commands with increased time Safety/Judgement: Decreased awareness of safety;Decreased awareness of deficits   Problem Solving: Slow processing;Decreased initiation;Difficulty sequencing;Requires verbal cues;Requires tactile cues General Comments: greater than 10 second delay in processing. very lethargic. Family states  pt did not sleep well last night.  Pt family actually shared that pt has trouble sleeping at night at home and sleeps during day even when home.       Exercises General Exercises - Upper Extremity Shoulder Flexion: AROM;Both;10 reps;Seated Shoulder ABduction: AROM;Both;10 reps;Seated Elbow Flexion: AROM;Both;10 reps;Seated General Exercises - Lower Extremity Ankle Circles/Pumps: AROM;Both;10 reps;Supine Short Arc Quad: AROM;Both;10 reps;Supine Long Arc Quad: AROM;Both;10 reps;Seated Heel Slides: AROM;Both;10 reps;Supine Hip ABduction/ADduction: AROM;Both;10 reps;Supine Hip Flexion/Marching: AROM;Both;10 reps;Seated    General Comments General comments (skin integrity, edema, etc.): Gave pt and sisters handouts with exercises on them.  Reviewed to do exercises 3xday.Discussed to perform when pt is alert and cue pt as needed as she needed a lot of cues to stay awake when doing them today.        Pertinent Vitals/Pain Pain Assessment: No/denies pain VSS    Home Living                      Prior Function            PT Goals (current goals can now be found in the care plan section) Progress towards PT goals: Progressing toward goals    Frequency  Min 3X/week    PT Plan Current plan remains appropriate    Co-evaluation             End of Session Equipment Utilized During Treatment: Gait belt Activity Tolerance: Patient limited by fatigue Patient left: in chair;with call bell/phone within reach;with family/visitor present     Time: 1410-3013 PT Time Calculation (min): 49 min  Charges:  $Therapeutic Exercise: 8-22 mins $Therapeutic Activity: 23-37 mins                    G Codes:      [name] [name] [name] 18-Feb-2014, 2:23 PM [facility] Acute Rehabilitation [phone] (pager)

## 2014-01-24 NOTE — Progress Notes (Signed)
Family Medicine Teaching Service Daily Progress Note Intern Pager: [phone]  Patient name: [name] [name] Medical record number: [id number] Date of birth: [birth year] Age: 63 y.o. Gender: female  Primary Care Provider: Tonye Pearson, MD Consultants: Cardiology, Neurology, ID Code Status: Full Code  Assessment and Plan: [name] [name] is a 63 y.o. female presenting with altered mental status suspected due to meningitis. PMH is significant for depression, anxiety, hypertension and hyperlipidemia.   #Acute encephalopathy secondary to Sepsis from meningitis - Work up consistent with bacterial meningitis (CSF - >600 protein, <2 glucose, WBC 11321 (90% neutrophils).  - Abx: CTX (9/19 >>>) (Day#7 of 14 day course)  - Vanc (9/18-9/21)  - Ampicillin (9/19-9/20)  - Zosyn (9/18 - 9/19)  - Acyclovir (9/18 - 9/19) - Steroids stopped on 9/23 -Micro:  - Blood Cx:  Gram Positive Cocci in Pairs, concerning for Strep. pneumo   -Repeat Blood Cx drawn on 9/20   -Sensitive to Ceftriaxone (0.19) and Levofloxacin (1.5)  - Urine Cx- no growth to date  - CSF Cx- WBC present both PMN and mononuclear; no organisms seen  - RPR- nonreactive  - HIV- nonreactive  - Strep Pneumo Urinary Antigen- Postive  - HSV- none detected  - Influenza- negative - WBC  13.9 today - Neurology consulted: continue with current treatment - Echo showed no signs of murmurs or vegetations - MRI- Diffusion and other more subtle signal abnormalities in the brain, new compared to 05/17/2013, most suggestive of acute meningitis / ventriculitis in this setting. It is possible there is a superimposed small right inferior  cerebellar infarct located near an emissary vein. However, favor instead this is infectious and related to #1.  -Repeat MRI in 5-7 days from MRI (9/21)  - Speech Eval- Dys 2 (Finely Chopped) Diet with thin liquids. Advance as tolerated.  #Impetigo vs. HSV Stomatitis- noted around mouth and right nostril -  Mupirocin 2% TID - ID consulted- Valtrex x7days  #Thrush- Nystatin mouthwash   #Elevated Troponin - Likely secondary to demand ischemia in setting of Sepsis - Troponin trended down to 1.52 - Negative Myoview study in August 2014 - Cardiology consulted and appreciate recs  -Plan for Myoview as outpatient.  -Echo to determine LV function: EF 55-60%; abnormal left ventricular relaxation (grade 1 diastolic dysfunction).  #HTN- 24hr range of 145-161 / 74-94 - BP mildly elevated this am at 164/72 - Restarted home Lisinopril on 9/23.   -Initially increased dose of Lisinopril to .  Cardiology recommended initiation of Carvedilol at 3.125mg , so Lisinopril was decreased back to home dose of  until effect of Coreg on BP can be evaluated.  Consider increase of Lisinopril if BP not controlled by Coreg. - Given a dose of Hydralazine this morning  #HLD:  On simvastatin 20 mg daily  - Holding in setting of critical illness and NPO status.  #Depression/Anxiety: holding all home (lexapro 10 mg) medications.   FEN/GI: Dys 2 (Finely Chopped) Diet with thin liquids, NS KVO, Senna PPx: Heparin  Disposition: Transfer to CIR at discharge.  Subjective:  No acute complaints overnight.  States her urination is much improved and she was able to get some sleep last night.  Hearing is improved and double vision while still present is also improved.  Mouth is sore secondary to the rash and she notes a new rash on the upper roof of her mouth.  No further complaints today.  Objective: Temp:  [97.5 F (36.4 C)-98.7 F (37.1 C)] 97.5 F (36.4 C) (09/24  0440) Pulse Rate:  [67-77] 67 (09/24 0440) Resp:  [17-18] 18 (09/24 0440) BP: (145-161)/(74-94) 153/90 mmHg (09/24 0440) SpO2:  [95 %-98 %] 97 % (09/24 0440)  Physical Exam: General: 62yo female resting comfortably and in no apparent distress. Alert and conversing today. Neck: tender to palpation HEENT:  Thrush noted on upper roof of  mouth Cardiovascular: S1 and S2 noted. No murmurs noted. Regular rhythm. Tachycardic.  Respiratory: Clear to auscultation bialterally. No wheezing noted. No increased work of breathing noted. Abdomen: Bowel sounds noted. Soft and non-distended. No tenderness or masses to palpation. Extremities: Warm and well perfused. No edema noted.  Neuro: Muscle strength equal bilaterally in upper and lower extremities.  No sensory deficits noted. Double vision with distance improved since yesterday. Skin: erythematous and blistering rash noted around mouth and up into right nostril, less erythematous than last exam  Laboratory:  Recent Labs Lab 01/22/14 0227 01/23/14 1220 01/24/14 0425  WBC 11.9* 13.9* 13.9*  HGB 11.6* 13.5 13.9  HCT 36.2 41.4 43.6  PLT 280 319 296    Recent Labs Lab 01/18/14 1615  01/22/14 0227 01/23/14 1220 01/24/14 0425  NA 138  < > 142 139 141  K 3.3*  < > 3.6* 3.8 5.1  CL 95*  < > 108 106 106  CO2 25  < > BUN 16  < > 31* 22 28*  CREATININE 0.94  < > 0.83 0.57 0.66  CALCIUM 9.6  < > 8.5 8.6 8.6  PROT 8.6*  --   --   --   --   BILITOT 0.7  --   --   --   --   ALKPHOS 105  --   --   --   --   ALT 25  --   --   --   --   AST 33  --   --   --   --   GLUCOSE 185*  < > 127* 128* 99  < > = values in this interval not displayed. POC Troponin - 0.63  Cardiac Panel (last 3 results) No results found for this basename: CKTOTAL, CKMB, TROPONINI, RELINDX,  in the last 72 hours Lactic acid - 3.27  CSF - >600 protein, <2 glucose, WBC 11321 (90% neutrophils).  Micro: Blood cx (9/18)- gram positive cocci; Strep pneumonia CSF cx (9/18)- WBC present both PMN and mononuclear; no organisms seen Urine cx (9/18)- no growth to date RPR- nonreactive HIV- nonreactive Strep Pneumo urinary antigen- positive  Hemoglobin A1C- 6.3  Imaging/Diagnostic Tests:  Ct Head Wo Contrast 01/18/2014    IMPRESSION: There is slight asymmetry of the cerebellar peduncles with the left  cerebellar peduncle slightly lower in density relative to the right. This may reflect normal variance versus an infarct. If there is further clinical concern recommend MRI.  Otherwise no acute intracranial pathology.    In ED, findings were discussed with Neurologist Dr. Roseanne Reno.  He felt that this was artifact. Recommended MRI.  Dg Chest Port 1 View 01/18/2014   CLINICAL DATA:  Altered mental status  EXAM: PORTABLE CHEST - 1 VIEW  COMPARISON:  10/11/2012  FINDINGS: Hypoventilation with bibasilar atelectasis. Mild cardiac enlargement without heart failure or effusion.  IMPRESSION: Hypoventilation with mild bibasilar atelectasis.   Echo- Left ventricle: EF 55-60%; abnormal left ventricular relaxation (grade 1 diastolic dysfunction).  01/21/2014:  - MRI Head- Diffusion and other more subtle signal abnormalities in the brain, new compared to 05/17/2013, most suggestive of acute meningitis /  ventriculitis in this setting. It is possible there is a superimposed small right inferior  cerebellar infarct located near an emissary vein. However, favor  instead this is infectious and related to #1.   [id number] [place], [place] 01/24/2014, 7:24 AM PGY-1, [facility] FPTS Intern pager: [phone], text pages welcome

## 2014-01-24 NOTE — Progress Notes (Signed)
FMTS Attending Note Patient seen and examined by me, discussed with resident team and I agree with Dr Richarda Overlie note for today. Patient is much more awake and alert, conversant.  Does not appear to have difficulties hearing in casual conversation.  Reports improvement in diplopia this morning.  Appreciate ID and Neurology consults.  A/P: Strep pneumo meningitis, continuing to improve. Remains on ceftriaxone, to complete a 14-day course.  For follow up MRI at 7 days (last MRI brain 09/21) if continues with sequelae.  For evaluation for possible CIR.  Cardiology has indicated outpatient cardiac workup is appropriate.  Paula Compton, MD

## 2014-01-24 NOTE — Progress Notes (Signed)
INFECTIOUS DISEASE PROGRESS NOTE  ID: [name] [name] is a 63 y.o. female with  Active Problems:   Sepsis   Altered mental status   Demand myocardial ischemic related infarction  Subjective: C/o mouth pain.   Abtx:  Anti-infectives   Start     Dose/Rate Route Frequency Ordered Stop   01/21/14 0200  vancomycin (VANCOCIN) IVPB 1000 mg/200 mL premix  Status:  Discontinued     1,000 mg 200 mL/hr over 60 Minutes Intravenous Every 8 hours 01/20/14 1811 01/21/14 1050   01/20/14 1815  vancomycin (VANCOCIN) IVPB 1000 mg/200 mL premix     1,000 mg 200 mL/hr over 60 Minutes Intravenous NOW 01/20/14 1812 01/20/14 1930   01/19/14 1115  ampicillin (OMNIPEN) 2 g in sodium chloride 0.9 % 50 mL IVPB  Status:  Discontinued     2 g 150 mL/hr over 20 Minutes Intravenous 6 times per day 01/19/14 1107 01/20/14 1056   01/19/14 1000  cefTRIAXone (ROCEPHIN) 2 g in dextrose 5 % 50 mL IVPB     2 g 100 mL/hr over 30 Minutes Intravenous Every 12 hours 01/19/14 0827     01/19/14 0830  ampicillin (OMNIPEN) 2 g in sodium chloride 0.9 % 50 mL IVPB  Status:  Discontinued     2 g 150 mL/hr over 20 Minutes Intravenous 6 times per day 01/19/14 0828 01/19/14 1107   01/19/14 0500  vancomycin (VANCOCIN) IVPB 1000 mg/200 mL premix  Status:  Discontinued     1,000 mg 200 mL/hr over 60 Minutes Intravenous Every 12 hours 01/18/14 1627 01/20/14 1811   01/19/14 0000  piperacillin-tazobactam (ZOSYN) IVPB 3.375 g  Status:  Discontinued     3.375 g 12.5 mL/hr over 240 Minutes Intravenous Every 8 hours 01/18/14 1627 01/19/14 0827   01/18/14 2100  acyclovir (ZOVIRAX) 605 mg in dextrose 5 % 100 mL IVPB  Status:  Discontinued     10 mg/kg  60.5 kg (Ideal) 112.1 mL/hr over 60 Minutes Intravenous 3 times per day 01/18/14 2000 01/19/14 0911   01/18/14 1630  piperacillin-tazobactam (ZOSYN) IVPB 3.375 g     3.375 g 100 mL/hr over 30 Minutes Intravenous  Once 01/18/14 1616 01/18/14 1815   01/18/14 1630  vancomycin (VANCOCIN)  IVPB 1000 mg/200 mL premix     1,000 mg 200 mL/hr over 60 Minutes Intravenous  Once 01/18/14 1616 01/18/14 2050      Medications:  Scheduled: . antiseptic oral rinse  7 mL Mouth Rinse BID  . aspirin  81 mg Oral Daily  . carvedilol  3.125 mg Oral BID WC  . cefTRIAXone (ROCEPHIN)  IV  2 g Intravenous Q12H  . feeding supplement (ENSURE COMPLETE)  237 mL Oral BID BM  . heparin  5,000 Units Subcutaneous 3 times per day  . lisinopril  20 mg Oral Daily  . mupirocin cream   Topical TID  . senna  1 tablet Oral Daily    Objective: Vital signs in last 24 hours: Temp:  [97.5 F (36.4 C)-98.7 F (37.1 C)] 97.5 F (36.4 C) (09/24 0440) Pulse Rate:  [67-77] 67 (09/24 0440) Resp:  [17-18] 18 (09/24 0440) BP: (145-161)/(74-94) 153/90 mmHg (09/24 0440) SpO2:  [95 %-98 %] 97 % (09/24 0440)   General appearance: alert, cooperative and no distress Throat: vesicles on mouth, lips, nares Resp: clear to auscultation bilaterally Cardio: regular rate and rhythm GI: normal findings: bowel sounds normal and soft, non-tender  Lab Results  Recent Labs  01/23/14 1220 01/24/14 0425  WBC 13.9* 13.9*  HGB 13.5 13.9  HCT 41.4 43.6  NA 139 141  K 3.8 5.1  CL 106 106  CO2 19 21  BUN 22 28*  CREATININE 0.57 0.66   Liver Panel No results found for this basename: PROT, ALBUMIN, AST, ALT, ALKPHOS, BILITOT, BILIDIR, IBILI,  in the last 72 hours Sedimentation Rate No results found for this basename: ESRSEDRATE,  in the last 72 hours C-Reactive Protein No results found for this basename: CRP,  in the last 72 hours  Microbiology: Recent Results (from the past 240 hour(s))  URINE CULTURE     Status: None   Collection Time    01/18/14  4:32 PM      Result Value Ref Range Status   Specimen Description URINE, CATHETERIZED   Final   Special Requests NONE   Final   Culture  Setup Time     Final   Value: 01/18/2014 17:43     Performed at [facility] Count     Final   Value: NO  GROWTH     Performed at [facility]   Culture     Final   Value: NO GROWTH     Performed at [facility]   Report Status 01/19/2014 FINAL   Final  CULTURE, BLOOD (ROUTINE X 2)     Status: None   Collection Time    01/18/14  4:49 PM      Result Value Ref Range Status   Specimen Description BLOOD ARM RIGHT   Final   Special Requests BOTTLES DRAWN AEROBIC AND ANAEROBIC 5CC   Final   Culture  Setup Time     Final   Value: 01/18/2014 21:55     Performed at [facility]   Culture     Final   Value: STREPTOCOCCUS PNEUMONIAE     Note: Gram Stain Report Called to,Read Back By and Verified With: CHRIS A@ 1034 ON [id number] BY [facility]     Performed at [facility]   Report Status 01/21/2014 FINAL   Final   Organism ID, Bacteria STREPTOCOCCUS PNEUMONIAE   Final  CULTURE, BLOOD (ROUTINE X 2)     Status: None   Collection Time    01/18/14  5:01 PM      Result Value Ref Range Status   Specimen Description BLOOD HAND RIGHT   Final   Special Requests BOTTLES DRAWN AEROBIC AND ANAEROBIC 4CC   Final   Culture  Setup Time     Final   Value: 01/18/2014 21:57     Performed at [facility]   Culture     Final   Value: STREPTOCOCCUS PNEUMONIAE     Note: SUSCEPTIBILITIES PERFORMED ON PREVIOUS CULTURE WITHIN THE LAST 5 DAYS.     Note: Gram Stain Report Called to,Read Back By and Verified With: CHRIS A@ 1034 ON [id number] BY [facility]     Performed at [facility]   Report Status 01/21/2014 FINAL   Final  CSF CULTURE     Status: None   Collection Time    01/18/14  9:20 PM      Result Value Ref Range Status   Specimen Description CSF   Final   Special Requests NO 2 2CC   Final   Gram Stain     Final   Value: WBC PRESENT,BOTH PMN AND MONONUCLEAR     NO ORGANISMS SEEN     CYTOSPIN Performed at [facility]  Performed at [facility]     Final   Value: NO GROWTH 3 DAYS     Performed at [facility]   Report Status 01/22/2014  FINAL   Final  GRAM STAIN     Status: None   Collection Time    01/18/14  9:20 PM      Result Value Ref Range Status   Specimen Description CSF   Final   Special Requests NO 2 2CC   Final   Gram Stain     Final   Value: WBC PRESENT,BOTH PMN AND MONONUCLEAR     NO ORGANISMS SEEN     CYTOSPIN SLIDE   Report Status 01/18/2014 FINAL   Final  VIRAL CULTURE VIRC     Status: None   Collection Time    01/18/14  9:20 PM      Result Value Ref Range Status   Specimen Description CSF   Final   Special Requests NONE   Final   Culture     Final   Value: No Herpes Simplex Detected in Cell Culture     Performed at [facility]   Report Status 01/23/2014 FINAL   Final  MRSA PCR SCREENING     Status: None   Collection Time    01/18/14 10:18 PM      Result Value Ref Range Status   MRSA by PCR NEGATIVE  NEGATIVE Final   Comment:            The GeneXpert MRSA Assay (FDA     approved for NASAL specimens     only), is one component of a     comprehensive MRSA colonization     surveillance program. It is not     intended to diagnose MRSA     infection nor to guide or     monitor treatment for     MRSA infections.  CULTURE, BLOOD (ROUTINE X 2)     Status: None   Collection Time    01/20/14 12:22 PM      Result Value Ref Range Status   Specimen Description BLOOD RIGHT ARM   Final   Special Requests BOTTLES DRAWN AEROBIC ONLY 8CC   Final   Culture  Setup Time     Final   Value: 01/20/2014 18:15     Performed at [facility]   Culture     Final   Value:        BLOOD CULTURE RECEIVED NO GROWTH TO DATE CULTURE WILL BE HELD FOR 5 DAYS BEFORE ISSUING A FINAL NEGATIVE REPORT     Performed at [facility]   Report Status PENDING   Incomplete  CULTURE, BLOOD (ROUTINE X 2)     Status: None   Collection Time    01/20/14  2:10 PM      Result Value Ref Range Status   Specimen Description BLOOD RIGHT ARM   Final   Special Requests BOTTLES DRAWN AEROBIC AND ANAEROBIC 5CC   Final    Culture  Setup Time     Final   Value: 01/20/2014 18:14     Performed at [facility]   Culture     Final   Value:        BLOOD CULTURE RECEIVED NO GROWTH TO DATE CULTURE WILL BE HELD FOR 5 DAYS BEFORE ISSUING A FINAL NEGATIVE REPORT     Performed at [facility]   Report Status PENDING  Incomplete    Studies/Results: No results found.   Assessment/Plan: Meningitis  Bacteremia- pneumococcus  Sepsis  MRI: acute meningitis / ventriculitis  HSV stomatitis  Total days of antibiotics: 7 ceftriaxone  Isolate is pneumococcus (I- PEN)  Will add valtrex for 7 days.  My great appreciation to Dr Thad Ranger.  No change in anbx, plan for 14 days.   Available if questions         [name] [name] Infectious Diseases (pager) [phone] [email or web address] 01/24/2014, 9:35 AM  LOS: 6 days

## 2014-01-24 NOTE — Care Management Note (Signed)
    Page 1 of 1   01/29/2014     2:04:38 PM CARE MANAGEMENT NOTE 01/29/2014  Patient:  [name] [name], [name] [name]   Account Number:  [id number]  Date Initiated:  01/24/2014  Documentation initiated by:  Anica Alcaraz  Subjective/Objective Assessment:   Pt adm on 01/18/14 with acute encephalopathy secondary to sepsis from meningitis.  PTA, pt resided at home alone.     Action/Plan:   Will follow for dc needs as pt progresses.  CIR consult in progress.  CSW to follow for SNF backup should pt not be approved for CIR.   Anticipated DC Date:  01/29/2014   Anticipated DC Plan:  IP REHAB FACILITY  In-house referral  Clinical Social Worker      DC Planning Services  CM consult      Choice offered to / List presented to:             Status of service:  Completed, signed off Medicare Important Message given?   (If response is "NO", the following Medicare IM given date fields will be blank) Date Medicare IM given:   Medicare IM given by:   Date Additional Medicare IM given:   Additional Medicare IM given by:    Discharge Disposition:  IP REHAB FACILITY  Per UR Regulation:  Reviewed for med. necessity/level of care/duration of stay  If discussed at Long Length of Stay Meetings, dates discussed:   01/24/2014  01/29/2014    Comments:  01/29/14 Sidney Ace, RN, BSN [phone] CIR approved by pt's insurance; pt to dc to IP rehab today.

## 2014-01-24 NOTE — Discharge Instructions (Signed)
Bacterial Meningitis °Meningitis is an inflammation of the fluid and membranes surrounding the spinal cord and brain. It is sometimes referred to as spinal meningitis. Bacterial meningitis is caused by a bacterial infection. It is important to know whether a virus or bacterium is causing your meningitis. This is because the severity of illness and the treatment differ. Viral meningitis is generally less severe and can go away without specific treatment. Bacterial meningitis is treated with antibiotics. Meningitis can also be caused by fungi, parasites, certain medicines, cancer, and autoimmune disorders. °People with lowered immune systems are at greater risk for poor outcomes from meningitis. It is important to get treatment as soon as possible to minimize the impact of a meningitis infection. Long-term complications could include seizures, hearing loss, weakness, paralysis, blindness, or cognitive impairment. °CAUSES °Bacterial meningitis occurs when certain bacteria reach the membranes covering the brain. These bacteria may enter the bloodstream and travel to the brain. They may also reach the brain directly through the nasal cavity or through an open wound in the skull. It is important to know which type of bacteria is causing the meningitis. Antibiotics can prevent some types of meningitis from spreading and infecting other people. °The types of bacteria that commonly cause meningitis vary by age group. Common causes by age group are listed below. °· Premature babies and newborns up to 3 months: °¨ Group B streptococcus, which exists in the vaginal tract. °¨ Escherichia coli, which exists in the digestive tract. °¨ Listeria monocytogenes. °· Older children: °¨ Neisseria meningitidis, often referred to as meningococcus. °¨ Streptococcus pneumoniae. °¨ Haemophilus influenzae type B, in children under 5 years old. °· Adults: °¨ Neisseria meningitidis. °¨ Streptococcus pneumoniae. °¨ Listeria monocytogenes, in  adults over 50 years old. °People who have a cerebral shunt, cochlear implant, or any similar device are at increased risk of infection. Rarely, an infection in the head and neck area (such as otitis media or mastoiditis) can lead to meningitis. Tuberculous meningitis is caused by an infection with Mycobacterium tuberculosis. This type of meningitis is more common in countries where tuberculosis is common. It may also occur in people with immune system problems, such as AIDS.  °SYMPTOMS  °Symptoms can develop over several hours. They may even take 1 to 2 days to develop. Common symptoms of meningitis in people over the age of 2 years include: °· High fever. °· Headache. °· Stiff neck. °· Irritability. °· Nausea and vomiting. °· Decreased appetite. °· Fatigue. °· Discomfort from exposure to light. °· Discomfort from exposure to loud noise. °· Trouble walking. °· Altered mental status. °· Seizures. °A rash may occur in severe cases of meningitis. Meningitis caused by the bacterium Neisseria meningitidis (known as "meningococcal meningitis") may cause a rapidly spreading rash, which appears before other symptoms. The rash consists of many small, irregular purple or red spots (petechiae) on the trunk, lower extremities, mucous membranes, conjunctiva of the eye, and sometimes on the palms of the hands or soles of the feet. °DIAGNOSIS  °Early diagnosis and treatment are very important. If you have symptoms, you should see your caregiver right away. Your evaluation may include lab tests of your blood and spinal fluid. A spinal fluid sample is taken through a procedure called a lumbar puncture or spinal tap. During the procedure, a needle is inserted into an area in the lower back. The fluid is examined and cultures are done. The type of bacteria causing the infection will be identified. You may also have a CT scan of   your brain as part of your evaluation. If there is suspicion of an infection or inflammation of the brain  (encephalitis), an MRI scan may be done. °PREVENTION °Bacterial meningitis is contagious. The bacteria spread from person to person in secretions from the lungs and throat. This may happen when you cough, sneeze, or kiss someone. None of the bacteria that cause meningitis are as contagious as the common cold or the flu. The bacteria are not spread by simply breathing the same air as a person who has meningitis. °The bacteria that cause meningitis can spread through close or prolonged contact with a patient who has certain types of bacterial meningitis. People who are at high risk should receive antibiotics to help prevent them from getting the disease. People at increased risk of getting the infection include: °· Health care workers. °· People who share a household with the patient. °· Day care center workers. °· Anyone who has direct contact with the patient's oral secretions. °Some forms of meningitis (such as those associated with meningococcus,  Haemophilus influenzae type B, pneumococci, or mumps virus infections) may be prevented by immunization. Talk to your caregiver about specific vaccines that are recommended for adults and children.  °TREATMENT  °Bacterial meningitis can be treated with many antibiotics. Treatment must be started early in the course of the disease. Antibiotics must be started immediately, even before the exact cause is determined. You will be given specific antibiotics once the exact cause is known. Steroids may also be used to limit swelling of the brain. °HOME CARE INSTRUCTIONS  °· Rest. °· Drink enough water and fluids to keep your urine clear or pale yellow. °· Take all medicine as prescribed. °· Practice good hygiene to prevent others from getting sick. °· Follow up with your caregiver as directed. °SEEK IMMEDIATE MEDICAL CARE IF:  °· You develop a high fever, neck stiffness, or confusion. °· You have a headache that becomes severe or does not respond to pain medicine. °· You develop  dizziness. °· You have a fast heartbeat. °· You have a seizure. °· You develop a rash. °· You are taking antibiotics and are not getting better. °· You develop severe vomiting or are unable to tolerate food or fluids. °· You have any worsening symptoms. °MAKE SURE YOU:  °· Understand these instructions. °· Will watch your condition. °· Will get help right away if you are not doing well or get worse. °Document Released: 06/28/2001 Document Revised: 09/03/2013 Document Reviewed: 08/05/2010 °Exit[employer]® Patient Information ©2015 [employer]. This information is not intended to replace advice given to you by your health care provider. Make sure you discuss any questions you have with your health care provider. ° °

## 2014-01-25 MED ORDER — MAGIC MOUTHWASH W/LIDOCAINE
2.0000 mL | Freq: Three times a day (TID) | ORAL | Status: DC | PRN
Start: 2014-01-25 — End: 2014-01-26
  Administered 2014-01-25: 2 mL via ORAL
  Filled 2014-01-25 (×2): qty 5

## 2014-01-25 NOTE — Progress Notes (Signed)
I met with pt, her son, and sister at bedside. We discussed an inpt rehab admission pending [employer] approval and bed availability early next week. They are in agreement. I will begin authorization and follow up on Monday. [phone]

## 2014-01-25 NOTE — Progress Notes (Signed)
Family Medicine Teaching Service Daily Progress Note Intern Pager: [phone]  Patient name: [name] [name] Medical record number: [id number] Date of birth: 1950-05-27 Age: 63 y.o. Gender: female  Primary Care Provider: Tonye Pearson, MD Consultants: Cardiology, Neurology, ID Code Status: Full Code  Assessment and Plan: [name] [name] is a 64 y.o. female presenting with altered mental status suspected due to meningitis. PMH is significant for depression, anxiety, hypertension and hyperlipidemia.   #Acute encephalopathy secondary to Sepsis from meningitis - Work up consistent with bacterial meningitis (CSF - >600 protein, <2 glucose, WBC 11321 (90% neutrophils).  - Abx: CTX (9/19 >>>) (Day#7)  -Antibiotic Day #8 of of 14 day course; last dose due 10/1  - Vanc (9/18-9/21)  - Ampicillin (9/19-9/20)  - Zosyn (9/18 - 9/19)  - Acyclovir (9/18 - 9/19) - Steroids stopped on 9/23 -Micro:  - Blood Cx:  Gram Positive Cocci in Pairs, concerning for Strep. pneumo   -Repeat Blood Cx drawn on 9/20   -Sensitive to Ceftriaxone (0.19) and Levofloxacin (1.5)  - Urine Cx- no growth to date  - CSF Cx- WBC present both PMN and mononuclear; no organisms seen  - RPR- nonreactive  - HIV- nonreactive  - Strep Pneumo Urinary Antigen- Postive  - HSV- none detected  - Influenza- negative - Neurology consulted: continue with current treatment - Echo showed no signs of murmurs or vegetations - MRI- Diffusion and other more subtle signal abnormalities in the brain, new compared to 05/17/2013, most suggestive of acute meningitis / ventriculitis in this setting. It is possible there is a superimposed small right inferior  cerebellar infarct located near an emissary vein. However, favor instead this is infectious and related to #1.  -Repeat MRI in 5-7 days from MRI (9/21)  - Speech Eval- Dys 2 (Finely Chopped) Diet with thin liquids. Advance as tolerated.  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  -Echo to determine LV function: EF 55-60%; abnormal left ventricular relaxation (grade 1 diastolic dysfunction).  #HTN- 24hr range of 127-143 / 67-86 - Last measured: 143/86 - Restarted home Lisinopril on 9/23.  - Coreg initiated by Cardiology on 9/23  #HLD:  On simvastatin 20 mg daily  - Holding in setting of critical illness and NPO status.  #Depression/Anxiety: restarted home medication lexapro 10 mg on 9/24  FEN/GI: Dys 2 (Finely Chopped) Diet with thin liquids, NS KVO, Senna PPx: Heparin  Disposition: Transfer to CIR at discharge once bed is available.  Subjective:  No acute complaints overnight.  Notes headache and pain in mouth today. Family states rash around mouth has improved.  States diplopia has resolved. No complaints with urination and states she is no longer constipated. IR nurse in room discussing disposition with family and anticipated placement in CIR next week. No further complaints today.  Objective: Temp:  [97.6 F (36.4 C)-98 F (36.7 C)] 97.6 F (36.4 C) (09/25 0500) Pulse Rate:  [67-74] 70 (09/25 0500) Resp:  [17-18] 18 (09/25 0500) BP: (127-143)/(67-86) 143/86 mmHg (09/25 0500) SpO2:  [99 %] 99 % (09/25 0500) Weight:  [246 lb 7.6 oz (111.8 kg)] 246 lb 7.6 oz (111.8 kg) (09/25 0500)  Physical Exam: General: 62yo female  resting comfortably and in no apparent distress. Alert and conversing today. Neck: tender to palpation HEENT:  Thrush noted on upper roof of mouth Cardiovascular: S1 and S2 noted. No murmurs noted. Regular rhythm.  Respiratory: Clear to auscultation bialterally. No wheezing noted. No increased work of breathing  noted. Abdomen: Bowel sounds noted. Soft and non-distended. No tenderness or masses to palpation.  Extremities: Warm and well perfused. No edema noted.  Neuro: Muscle strength equal bilaterally in upper and lower extremities.  No sensory deficits noted.No double vision noted. Skin: erythematous and blistering rash noted around mouth and up into right nostril, less erythematous and swollen since last exam  Laboratory:  Recent Labs Lab 01/22/14 0227 01/23/14 1220 01/24/14 0425  WBC 11.9* 13.9* 13.9*  HGB 11.6* 13.5 13.9  HCT 36.2 41.4 43.6  PLT 280 319 296    Recent Labs Lab 01/18/14 1615  01/22/14 0227 01/23/14 1220 01/24/14 0425  NA 138  < > 142 139 141  K 3.3*  < > 3.6* 3.8 5.1  CL 95*  < > 108 106 106  CO2 25  < > 23 19 21   BUN 16  < > 31* 22 28*  CREATININE 0.94  < > 0.83 0.57 0.66  CALCIUM 9.6  < > 8.5 8.6 8.6  PROT 8.6*  --   --   --   --   BILITOT 0.7  --   --   --   --   ALKPHOS 105  --   --   --   --   ALT 25  --   --   --   --   AST 33  --   --   --   --   GLUCOSE 185*  < > 127* 128* 99  < > = values in this interval not displayed. POC Troponin - 0.63  Cardiac Panel (last 3 results) No results found for this basename: CKTOTAL, CKMB, TROPONINI, RELINDX,  in the last 72 hours Lactic acid - 3.27  CSF - >600 protein, <2 glucose, WBC 11321 (90% neutrophils).  Micro: Blood cx (9/18)- gram positive cocci; Strep pneumonia CSF cx (9/18)- WBC present both PMN and mononuclear; no organisms seen Urine cx (9/18)- no growth to date RPR- nonreactive HIV- nonreactive Strep Pneumo urinary antigen- positive  Hemoglobin A1C- 6.3  Imaging/Diagnostic Tests:  Ct Head Wo Contrast 01/18/2014    IMPRESSION: There is slight asymmetry of the cerebellar peduncles with the left cerebellar peduncle slightly lower in density relative to the right. This may reflect normal variance versus an infarct. If there is further clinical concern recommend MRI.  Otherwise no acute  intracranial pathology.    In ED, findings were discussed with Neurologist Dr. Roseanne Reno.  He felt that this was artifact. Recommended MRI.  Dg Chest Port 1 View 01/18/2014   CLINICAL DATA:  Altered mental status  EXAM: PORTABLE CHEST - 1 VIEW  COMPARISON:  10/11/2012  FINDINGS: Hypoventilation with bibasilar atelectasis. Mild cardiac enlargement without heart failure or effusion.  IMPRESSION: Hypoventilation with mild bibasilar atelectasis.   Echo- Left ventricle: EF 55-60%; abnormal left ventricular relaxation (grade 1 diastolic dysfunction).  01/21/2014:  - MRI Head- Diffusion and other more subtle signal abnormalities in the brain, new compared to 05/17/2013, most suggestive of acute meningitis / ventriculitis in this setting. It is possible there is a superimposed small right inferior  cerebellar infarct located near an emissary vein. However, favor  instead this is infectious and related to #1.   [facility]  Bonnell Public, DO 01/25/2014, 7:14 AM PGY-1, [facility] FPTS Intern pager: [phone], text pages welcome

## 2014-01-25 NOTE — Progress Notes (Signed)
Patient Profile: PMHx of hypertension, hyperlipidemia, history of cigarette smoking, who was admitted to [facility] on 01/18/2014 for evaluation of altered mental status and diagnosed with meningitis. She was found to have a positive troponin level and we were asked to see her in consultation.   Cardiac history is significant for negative Myoview in August 2014 with normal EF at that time. 2D echo this admission on 9/19 with normal EF at 55-60% w/o WMA. No history of chest pain.    Subjective: Somnolent this am. Sister and son both by bedside. No complaints.   Objective: Vital signs in last 24 hours: Temp:  [97.6 F (36.4 C)-98 F (36.7 C)] 97.6 F (36.4 C) (09/25 0500) Pulse Rate:  [67-74] 70 (09/25 0500) Resp:  [17-18] 18 (09/25 0500) BP: (127-143)/(67-86) 143/86 mmHg (09/25 0500) SpO2:  [99 %] 99 % (09/25 0500) Weight:  [246 lb 7.6 oz (111.8 kg)] 246 lb 7.6 oz (111.8 kg) (09/25 0500) Last BM Date: 01/24/14  Intake/Output from previous day: 09/24 0701 - 09/25 0700 In: 480 [P.O.:480] Out: 400 [Urine:400] Intake/Output this shift: Total I/O In: -  Out: 125 [Urine:125]  Medications Current Facility-Administered Medications  Medication Dose Route Frequency Provider Last Rate Last Dose  . 0.9 %  sodium chloride infusion   Intravenous Continuous Myra Rude, MD 10 mL/hr at 01/23/14 1209 10 mL/hr at 01/23/14 1209  . acetaminophen (TYLENOL) tablet 650 mg  650 mg Oral Q6H PRN Myra Rude, MD   650 mg at 01/25/14 5465   Or  . acetaminophen (TYLENOL) suppository 650 mg  650 mg Rectal Q6H PRN Myra Rude, MD      . antiseptic oral rinse (CPC / CETYLPYRIDINIUM CHLORIDE 0.05%) solution 7 mL  7 mL Mouth Rinse BID Myra Rude, MD   7 mL at 01/24/14 1000  . aspirin chewable tablet 81 mg  81 mg Oral Daily Jacquiline Doe, MD   81 mg at 01/24/14 1106  . carvedilol (COREG) tablet 3.125 mg  3.125 mg Oral BID WC Marykay Lex, MD   3.125 mg at 01/24/14 1725  . cefTRIAXone  (ROCEPHIN) 2 g in dextrose 5 % 50 mL IVPB  2 g Intravenous Q12H Tommie Sams, DO   2 g at 01/24/14 2308  . escitalopram (LEXAPRO) tablet 10 mg  10 mg Oral Daily [facility], DO   10 mg at 01/24/14 1117  . feeding supplement (ENSURE COMPLETE) (ENSURE COMPLETE) liquid 237 mL  237 mL Oral BID BM Ailene Ards, RD   237 mL at 01/24/14 1535  . heparin injection 5,000 Units  5,000 Units Subcutaneous 3 times per day Myra Rude, MD   5,000 Units at 01/25/14 [phone]  . hydrALAZINE (APRESOLINE) injection 5 mg  5 mg Intravenous Q6H PRN [place] N Rum[place], DO      . lisinopril (PRINIVIL,ZESTRIL) tablet 20 mg  20 mg Oral Daily [place] N Rumley, DO   20 mg at 01/24/14 1106  . LORazepam (ATIVAN) injection 1 mg  1 mg Intravenous Q6H PRN Tommie Sams, DO   1 mg at 01/24/14 0025  . magic mouthwash w/lidocaine  2 mL Oral TID PRN Araceli Bouche, DO      . morphine 2 MG/ML injection 1 mg  1 mg Intravenous Q2H PRN Tommie Sams, DO   1 mg at 01/23/14 0016  . nystatin (MYCOSTATIN) 100000 UNIT/ML suspension 500,000 Units  5 mL Oral QID [place] N Rumley, DO   500,000 Units at  01/24/14 2309  . senna (SENOKOT) tablet 8.6 mg  1 tablet Oral Daily [place] N Rumley, DO   8.6 mg at 01/24/14 1106  . simvastatin (ZOCOR) tablet 20 mg  20 mg Oral q1800 [facility], DO   20 mg at 01/24/14 1725  . valACYclovir (VALTREX) tablet 1,000 mg  1,000 mg Oral TID Ginnie Smart, MD   1,000 mg at 01/24/14 2308  . vitamin A & D ointment   Topical PRN Janit Pagan, MD        PE: General appearance: alert, cooperative and no distress Lungs: clear to auscultation bilaterally Heart: regular rate and rhythm Extremities: no LEE Pulses: 2+ and symmetric Skin: warm and dry Neurologic: Grossly normal  Lab Results:   Recent Labs  01/23/14 1220 01/24/14 0425  WBC 13.9* 13.9*  HGB 13.5 13.9  HCT 41.4 43.6  PLT 319 296   BMET  Recent Labs  01/23/14 1220 01/24/14 0425  NA 139 141  K 3.8 5.1  CL 106 106  CO2  19 21  GLUCOSE 128* 99  BUN 22 28*  CREATININE 0.57 0.66  CALCIUM 8.6 8.6   Assessment/Plan    Active Problems:   Sepsis   Altered mental status   Demand myocardial ischemic related infarction  1. Elevated Troponin: Felt due to demand ischemia. Myoview in 2014 was negative for ischemia. 2D echo this admission with normal EF and no WMA. Patient has been w/o chest pain. HR and BP have been stable. No events on telemetry. She is stable from a cardiac standpoint. No further inpatient cardiac w/u is indicated. We will sign off for now and will arrange for OP f/u with her primary cardiologist Dr. Clifton James. Recommend continuation of ASA, BB, ACE and statin for primary prevention.    LOS: 7 days    Brittainy M. Delmer Islam 01/25/2014 10:31 AM  I have seen and evaluated the patient today along with Boyce Medici, PA-C. The patient remained stable from a cardiac standpoint. 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  In the setting of acute illness she did have positive troponin levels consistent with demand ischemia. No complaints of chest tightness or pressure to suggest angina. Echocardiographically her LV function appears to be stable with no regional wall motion abnormalities which would argue against a large infarct.  At this point would not pursue any further cardiac evaluation during hospitalization. She is on carvedilol and lisinopril as well as aspirin and statin for cardiac protection.  We will arrange outpatient followup. We'll defer actually to Dr. Clifton James as to whether he would consider an outpatient Myoview. She will need several months to recover from this acute illness, and if she does not have any active anginal symptoms, there would be no clear indication to check a stress test and based on the positive troponins.  Cardiology we'll sign off now. Remains stable from cardiac standpoint. Followup will be arranged.  Please do not hesitate  to contact us over the weekend if need.     Marykay Lex, M.D., M.S. Interventional Cardiologist   Pager # [phone]

## 2014-01-25 NOTE — Progress Notes (Signed)
FMTS Attending Note Patient seen and examined by me today, discussed with resident team and I agree with Dr Richarda Overlie note for today.  Patient reports resolution of diplopia, improvement in hearing. Appreciate CIR consult, plans for transfer when arrangements in order.  Plan for continuation of abx therapy for total 14 days.  Paula Compton, MD

## 2014-01-26 LAB — CULTURE, BLOOD (ROUTINE X 2)
Culture: NO GROWTH
Culture: NO GROWTH

## 2014-01-26 LAB — BASIC METABOLIC PANEL
Anion gap: 11 (ref 5–15)
BUN: 22 mg/dL (ref 6–23)
CALCIUM: 8.3 mg/dL — AB (ref 8.4–10.5)
CO2: 26 mEq/L (ref 19–32)
Chloride: 100 mEq/L (ref 96–112)
Creatinine, Ser: 0.68 mg/dL (ref 0.50–1.10)
GLUCOSE: 97 mg/dL (ref 70–99)
Potassium: 4.1 mEq/L (ref 3.7–5.3)
SODIUM: 137 meq/L (ref 137–147)

## 2014-01-26 LAB — CBC
HCT: 37.7 % (ref 36.0–46.0)
Hemoglobin: 12.2 g/dL (ref 12.0–15.0)
MCH: 26 pg (ref 26.0–34.0)
MCHC: 32.4 g/dL (ref 30.0–36.0)
MCV: 80.2 fL (ref 78.0–100.0)
PLATELETS: 245 10*3/uL (ref 150–400)
RBC: 4.7 MIL/uL (ref 3.87–5.11)
RDW: 13.1 % (ref 11.5–15.5)
WBC: 10 10*3/uL (ref 4.0–10.5)

## 2014-01-26 MED ORDER — MAGIC MOUTHWASH W/LIDOCAINE
2.0000 mL | Freq: Three times a day (TID) | ORAL | Status: DC
  Administered 2014-01-26 – 2014-01-29 (×9): 2 mL via ORAL
  Filled 2014-01-26 (×11): qty 5

## 2014-01-26 MED ORDER — SENNA 8.6 MG PO TABS
1.0000 | ORAL_TABLET | Freq: Every day | ORAL | Status: DC | PRN
  Filled 2014-01-26: qty 1

## 2014-01-26 MED ORDER — ACYCLOVIR 5 % EX OINT
TOPICAL_OINTMENT | CUTANEOUS | Status: DC
Start: 2014-01-26 — End: 2014-01-29
  Administered 2014-01-26 (×3): via TOPICAL
  Administered 2014-01-26: 1 via TOPICAL
  Administered 2014-01-26: 21:00:00 via TOPICAL
  Administered 2014-01-27: 1 via TOPICAL
  Administered 2014-01-27 (×2): via TOPICAL
  Administered 2014-01-27: 1 via TOPICAL
  Administered 2014-01-27 (×3): via TOPICAL
  Administered 2014-01-28 (×3): 1 via TOPICAL
  Administered 2014-01-28: 13:00:00 via TOPICAL
  Administered 2014-01-28: 1 via TOPICAL
  Administered 2014-01-28: 17:00:00 via TOPICAL
  Administered 2014-01-29: 1 via TOPICAL
  Administered 2014-01-29: 12:00:00 via TOPICAL
  Administered 2014-01-29: 1 via TOPICAL
  Administered 2014-01-29 (×2): via TOPICAL
  Filled 2014-01-26 (×2): qty 30

## 2014-01-26 NOTE — Progress Notes (Signed)
Family Medicine Teaching Service Daily Progress Note Intern Pager: [phone]  Patient name: [name] [name] Medical record number: [id number] Date of birth: [birth year] Age: 63 y.o. Gender: female  Primary Care Provider: Tonye Pearson, MD Consultants: Cardiology, Neurology, ID Code Status: Full Code  Assessment and Plan: [name] [name] is a 63 y.o. female presenting with altered mental status suspected due to meningitis. PMH is significant for depression, anxiety, hypertension and hyperlipidemia.   #Acute encephalopathy secondary to Sepsis from meningitis - Work up consistent with bacterial meningitis (CSF - >600 protein, <2 glucose, WBC 11321 (90% neutrophils).  - Abx: CTX (9/19 >>>) (Day#8)  -Antibiotic Day #9 of of 14 day course; last dose due 10/1  - Vanc (9/18-9/21)  - Ampicillin (9/19-9/20)  - Zosyn (9/18 - 9/19)  - Acyclovir (9/18 - 9/19) - Steroids stopped on 9/23 -Micro:  - Blood Cx:  Gram Positive Cocci in Pairs, concerning for Strep. pneumo   -Repeat Blood Cx drawn on 9/20   -Sensitive to Ceftriaxone (0.19) and Levofloxacin (1.5)  - Urine Cx- no growth to date  - CSF Cx- WBC present both PMN and mononuclear; no organisms seen  - RPR- nonreactive  - HIV- nonreactive  - Strep Pneumo Urinary Antigen- Postive  - HSV- none detected  - Influenza- negative - Neurology consulted: continue with current treatment - Echo showed no signs of murmurs or vegetations - MRI- Diffusion and other more subtle signal abnormalities in the brain, new compared to 05/17/2013, most suggestive of acute meningitis / ventriculitis in this setting. It is possible there is a superimposed small right inferior  cerebellar infarct located near an emissary vein. However, favor instead this is infectious and related to #1.  -Repeat MRI in 5-7 days from MRI on 9/21 (plan for 9/28)  - Speech Eval- Dys 2 (Finely Chopped) Diet with thin liquids. Advance as tolerated.  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  -Echo to determine LV function: EF 55-60%; abnormal left ventricular relaxation (grade 1 diastolic dysfunction).  #HTN- 24hr range of 120-137 / 49-75 - Last measured: 137/69 - Restarted home Lisinopril on 9/23.  - Coreg initiated by Cardiology on 9/23  FEN/GI: Dys 2 (Finely Chopped) Diet with thin liquids, NS KVO, Senna PRN PPx: Heparin  Disposition: Transfer to CIR at discharge once bed is available.  Subjective:  No acute complaints overnight.  States headache has improved. Continues to complain of mouth pain. Continues to feel weak; sisters appear enthusiastic about working with her to increase her strength.  Complains of nonproductive cough since yesterday.  No problems with urination and states she is having 2-3 loose stools/day.  No further complaints today.  Objective: Temp:  [97 F (36.1 C)-98.7 F (37.1 C)] 98.1 F (36.7 C) (09/26 0521) Pulse Rate:  [65-73] 73 (09/26 0521) Resp:  [17-18] 18 (09/26 0521) BP: (120-137)/(49-75) 137/69 mmHg (09/26 0521) SpO2:  [98 %-100 %] 99 % (09/26 0521)  Physical Exam: General: 62yo female resting comfortably and in no apparent distress. Alert and conversing today. Neck: tender to palpation Cardiovascular: S1 and S2 noted. No murmurs noted. Regular rhythm.  Respiratory: Clear to auscultation bialterally. No wheezing noted. No increased  work of breathing noted. Abdomen: Bowel sounds noted. Soft and non-distended. No tenderness or masses to palpation.  Extremities: Warm and well perfused. No edema noted.  Skin: Crusty vesicular rash noted around mouth and up to right nostril  Laboratory:  Recent Labs Lab  01/22/14 0227 01/23/14 1220 01/24/14 0425  WBC 11.9* 13.9* 13.9*  HGB 11.6* 13.5 13.9  HCT 36.2 41.4 43.6  PLT 280 319 296    Recent Labs Lab 01/22/14 0227 01/23/14 1220 01/24/14 0425  NA 142 139 141  K 3.6* 3.8 5.1  CL 108 106 106  CO2 23 19 21   BUN 31* 22 28*  CREATININE 0.83 0.57 0.66  CALCIUM 8.5 8.6 8.6  GLUCOSE 127* 128* 99   POC Troponin - 0.63  Cardiac Panel (last 3 results) No results found for this basename: CKTOTAL, CKMB, TROPONINI, RELINDX,  in the last 72 hours Lactic acid - 3.27  CSF - >600 protein, <2 glucose, WBC 11321 (90% neutrophils).  Micro: Blood cx (9/18)- gram positive cocci; Strep pneumonia CSF cx (9/18)- WBC present both PMN and mononuclear; no organisms seen Urine cx (9/18)- no growth to date RPR- nonreactive HIV- nonreactive Strep Pneumo urinary antigen- positive  Hemoglobin A1C- 6.3  Imaging/Diagnostic Tests:  Ct Head Wo Contrast 01/18/2014    IMPRESSION: There is slight asymmetry of the cerebellar peduncles with the left cerebellar peduncle slightly lower in density relative to the right. This may reflect normal variance versus an infarct. If there is further clinical concern recommend MRI.  Otherwise no acute intracranial pathology.    In ED, findings were discussed with Neurologist Dr. Roseanne Reno.  He felt that this was artifact. Recommended MRI.  Dg Chest Port 1 View 01/18/2014   CLINICAL DATA:  Altered mental status  EXAM: PORTABLE CHEST - 1 VIEW  COMPARISON:  10/11/2012  FINDINGS: Hypoventilation with bibasilar atelectasis. Mild cardiac enlargement without heart failure or effusion.  IMPRESSION: Hypoventilation with mild bibasilar atelectasis.   Echo- Left ventricle: EF 55-60%; abnormal left ventricular relaxation (grade 1 diastolic dysfunction).  01/21/2014:  - MRI Head- Diffusion and other more subtle signal abnormalities in the brain, new compared to 05/17/2013, most suggestive of acute meningitis / ventriculitis in this setting. It  is possible there is a superimposed small right inferior  cerebellar infarct located near an emissary vein. However, favor  instead this is infectious and related to #1.   [address] [place], [place] 01/26/2014, 7:28 AM PGY-1, [facility] FPTS Intern pager: [phone], text pages welcome

## 2014-01-26 NOTE — Progress Notes (Signed)
FMTS Attending Note Patient seen and examined by me, discussed with resident team and I agree with Dr Richarda Overlie note of today. Patient is awake and alert, reports resolution in diplopia.  Speaking more fluidly, better enunciation.  Crusting of HSV stomatitis.  Plan to continue abx treatment as per ID recommendations. Valtrex for HSV stomatitis. PT for strengthening until transfer to CIR. Paula Compton, MD

## 2014-01-27 ENCOUNTER — Inpatient Hospital Stay (HOSPITAL_COMMUNITY): Payer: Federal, State, Local not specified - PPO

## 2014-01-27 MED ORDER — GADOBENATE DIMEGLUMINE 529 MG/ML IV SOLN
20.0000 mL | Freq: Once | INTRAVENOUS | Status: AC | PRN
  Administered 2014-01-27: 20 mL via INTRAVENOUS

## 2014-01-27 NOTE — Progress Notes (Signed)
Family Medicine Teaching Service Daily Progress Note Intern Pager: [phone]  Patient name: [name] [name] Medical record number: [id number] Date of birth: [birth year] Age: 63 y.o. Gender: female  Primary Care Provider: Tonye Pearson, MD Consultants: Cardiology, Neurology, ID Code Status: Full Code  Assessment and Plan: [name] [name] is a 63 y.o. female presenting with altered mental status suspected due to meningitis. PMH is significant for depression, anxiety, hypertension and hyperlipidemia.   #Acute encephalopathy secondary to Sepsis from meningitis - Work up consistent with bacterial meningitis (CSF - >600 protein, <2 glucose, WBC 11321 (90% neutrophils).  - Abx: CTX (9/19 >>>) (Day#9)  -Antibiotic Day #10 of of 14 day course; last dose due 10/1  - Vanc (9/18-9/21)  - Ampicillin (9/19-9/20)  - Zosyn (9/18 - 9/19)  - Acyclovir (9/18 - 9/19) - Steroids stopped on 9/23 - Neurology consulted: continue with current treatment -Repeat MRI in 5-7 days from MRI on 9/21 (MRI today: 01/27/14)  - Speech Eval- Dys 2 (Finely Chopped) Diet with thin liquids. - Advance diet as tolerated  # HSV Stomatitis- noted around mouth and right nostril - ID consulted- Valtrex Day #4 of 7 day course - Magic Mouthwash TID for pain  #Elevated Troponin - Likely secondary to demand ischemia in setting of Sepsis - Troponin trended down to 1.52 - Negative Myoview study in August 2014 - Cardiology consulted and appreciate recs  -Plan for Myoview as outpatient.  -Echo to determine LV function: EF 55-60%; abnormal left ventricular relaxation (grade 1 diastolic dysfunction).  #HTN- normotensive - continue home Lisinopril  - continue Coreg  FEN/GI: Dys 2 (Finely Chopped) Diet with thin liquids, NS KVO, Senna PRN PPx: Heparin  Disposition: Transfer to CIR at discharge once bed is available.  Subjective:  No acute complaints overnight. Son at bedside. Overall, mobility and strength improving. No  diplopia. Eating better today. Has been ambulating to commode and to recliner with assistance. She has some mouth pain. Overall, she, and her son, feel like she is doing better.  Objective: Temp:  [97.7 F (36.5 C)-99 F (37.2 C)] 97.7 F (36.5 C) (09/27 0426) Pulse Rate:  [65-71] 65 (09/27 0426) Resp:  [18] 18 (09/27 0426) BP: (123-144)/(62-66) 134/62 mmHg (09/27 0426) SpO2:  [99 %-100 %] 100 % (09/27 0426) Weight:  [239 lb 3.2 oz (108.5 kg)] 239 lb 3.2 oz (108.5 kg) (09/27 0137)  Physical Exam: General: 63yo female resting comfortably and in no apparent distress. Performing a crossword puzzle. She is alert and conversing today. Son at bedside Cardiovascular: S1 and S2 noted. No murmurs noted. Regular rhythm.  Respiratory: Transmitted upper airway sounds heard bilaterally that are loudest around nose and neck. No increased work of breathing noted. Abdomen: Bowel sounds noted. Soft and non-distended. No tenderness or masses to palpation.  Extremities: Warm and well perfused. No edema noted.  Neuro: 5/5 strength of upper extremity. 3/5 strength of bilateral lower extremity Skin: Crusted rash noted around mouth and up to right nostril  Laboratory:  Recent Labs Lab 01/23/14 1220 01/24/14 0425 01/26/14 0825  WBC 13.9* 13.9* 10.0  HGB 13.5 13.9 12.2  HCT 41.4 43.6 37.7  PLT 319 296 245    Recent Labs Lab 01/23/14 1220 01/24/14 0425 01/26/14 0825  NA 139 141 137  K 3.8 5.1 4.1  CL 106 106 100  CO2 19 21 26   BUN 22 28* 22  CREATININE 0.57 0.66 0.68  CALCIUM 8.6 8.6 8.3*  GLUCOSE 128* 99 97   Lactic acid -  3.27  CSF - >600 protein, <2 glucose, WBC 11321 (90% neutrophils).  Micro: Blood cx (9/18)- gram positive cocci; Strep pneumonia CSF cx (9/18)- WBC present both PMN and mononuclear; no organisms seen Urine cx (9/18)- no growth to date RPR- nonreactive HIV- nonreactive Strep Pneumo urinary antigen- positive  Hemoglobin A1C- 6.3  Imaging/Diagnostic  Tests:    Jacquelin Hawking, MD 01/27/2014, 8:28 AM PGY-2, [facility] FPTS Intern pager: [phone], text pages welcome

## 2014-01-27 NOTE — Progress Notes (Signed)
FMTS Attending Note Patient seen and examined by me, discussed with resident team and I agree with Dr Dennison Nancy note for today. Patient is continuing to make improvements. Speech more fluid.  For repeat MRI today to compare with prior on 09/21, with findings thought most likely consistent with bacterial meningitis at that time.  Plan for CIR when arrangements are made.  Paula Compton, MD

## 2014-01-28 LAB — CBC
HCT: 37.2 % (ref 36.0–46.0)
HEMOGLOBIN: 11.8 g/dL — AB (ref 12.0–15.0)
MCH: 26 pg (ref 26.0–34.0)
MCHC: 31.7 g/dL (ref 30.0–36.0)
MCV: 82.1 fL (ref 78.0–100.0)
Platelets: 278 10*3/uL (ref 150–400)
RBC: 4.53 MIL/uL (ref 3.87–5.11)
RDW: 13.2 % (ref 11.5–15.5)
WBC: 7.1 10*3/uL (ref 4.0–10.5)

## 2014-01-28 LAB — BASIC METABOLIC PANEL
Anion gap: 9 (ref 5–15)
BUN: 18 mg/dL (ref 6–23)
CHLORIDE: 101 meq/L (ref 96–112)
CO2: 24 meq/L (ref 19–32)
Calcium: 8.4 mg/dL (ref 8.4–10.5)
Creatinine, Ser: 0.62 mg/dL (ref 0.50–1.10)
GFR calc Af Amer: >90 mL/min (ref >90)
Glucose, Bld: 104 mg/dL — ABNORMAL HIGH (ref 70–99)
Potassium: 4.4 mEq/L (ref 3.7–5.3)
SODIUM: 134 meq/L — AB (ref 137–147)

## 2014-01-28 NOTE — Progress Notes (Signed)
Seen and examined.  Agree with the documentation and management by Dr. Caroleen Hamman.  [name] [name] continues to make steady progress.  Ready for transfer to CIR when bed available to complete antibiotic RX.

## 2014-01-28 NOTE — Progress Notes (Addendum)
I await [employer] approval for a possible inpt rehab \\admission  today. I have asked P.T. For updated assessment to send to [facility] today. [phone]

## 2014-01-28 NOTE — Progress Notes (Signed)
Physical Therapy Treatment Patient Details Name: [name] [name] MRN: [id number] DOB: [birth year] Today's Date: 01/28/2014    History of Present Illness 63 y.o. female with a PMHx of hypertension, hyperlipidemia, history of cigarette smoking, who was admitted to [facility] on 01/18/2014 for evaluation of altered mental status. She was diagnosed as having meningitis    PT Comments    Pt much more alert today with improved sitting balance.  Able to initiate gait training with close recliner follow due to fatigue.  Sisters present and supportive. Con't to recommend CIR for continued rehab.  Follow Up Recommendations  CIR;Supervision/Assistance - 24 hour     Equipment Recommendations  None recommended by PT    Recommendations for Other Services       Precautions / Restrictions Precautions Precautions: Fall    Mobility  Bed Mobility Overal bed mobility: Needs Assistance Bed Mobility: Supine to Sit     Supine to sit: Min assist;HOB elevated     General bed mobility comments: ABle to move legs and needed A to get trunk upright.  Transfers Overall transfer level: Needs assistance Equipment used: Rolling walker (2 wheeled) Transfers: Sit to/from Stand Sit to Stand: Min assist;+2 safety/equipment         General transfer comment: Cues for proper hand placement  Ambulation/Gait Ambulation/Gait assistance: Min assist;+2 safety/equipment Ambulation Distance (Feet): 12 Feet Assistive device: Rolling walker (2 wheeled) Gait Pattern/deviations: Decreased step length - right;Decreased step length - left;Trunk flexed Gait velocity: decreased   General Gait Details: A with maneuvering RW with turn around bed.  Cues to look up.  Fatigued after gait.   Stairs            Wheelchair Mobility    Modified Rankin (Stroke Patients Only)       Balance Overall balance assessment: Needs assistance Sitting-balance support: Bilateral upper extremity supported Sitting balance-Leahy  Scale: Fair     Standing balance support: Bilateral upper extremity supported Standing balance-Leahy Scale: Fair Standing balance comment: fair balance with RW                    Cognition Arousal/Alertness: Awake/alert Behavior During Therapy: Flat affect Overall Cognitive Status: Impaired/Different from baseline Area of Impairment: Memory     Memory: Decreased short-term memory Following Commands: Follows one step commands with increased time (Pt also HOH)     Problem Solving: Slow processing;Decreased initiation;Difficulty sequencing;Requires verbal cues;Requires tactile cues      Exercises      General Comments General comments (skin integrity, edema, etc.): Sores on mouth that have scabbed up.      Pertinent Vitals/Pain Pain Assessment: Faces Faces Pain Scale: Hurts a little bit Pain Location: R hip    Home Living                      Prior Function            PT Goals (current goals can now be found in the care plan section) Acute Rehab PT Goals PT Goal Formulation: With patient/family Time For Goal Achievement: 02/06/14 Potential to Achieve Goals: Good Progress towards PT goals: Progressing toward goals    Frequency  Min 3X/week    PT Plan Current plan remains appropriate    Co-evaluation             End of Session Equipment Utilized During Treatment: Gait belt Activity Tolerance: Patient tolerated treatment well;Patient limited by fatigue Patient left: in chair;with call bell/phone within reach;with family/visitor  present     Time: 1322-1336 PT Time Calculation (min): 14 min  Charges:  $Gait Training: 8-22 mins                    G Codes:      Kushi Kun LUBECK 01/28/2014, 1:45 PM

## 2014-01-28 NOTE — Progress Notes (Signed)
Occupational Therapy Treatment Patient Details Name: [name] [name] MRN: [id number] DOB: 11-29-1950 Today's Date: 01/28/2014    History of present illness 63 y.o. female with a PMHx of hypertension, hyperlipidemia, history of cigarette smoking, who was admitted to [facility] on 01/18/2014 for evaluation of altered mental status. She was diagnosed as having meningitis   OT comments  Pt demonstrates significant improvements with activity tolerance.  She was able to perform toilet transfer to [facility] and toileting hygiene with min a.  She requires several rest breaks, but is very motivated to continue to work with OT.  She will be an excellent rehab candidate, and should progress nicely to allow her to return home modified independently   Follow Up Recommendations  CIR;Supervision/Assistance - 24 hour    Equipment Recommendations  3 in 1 bedside comode;Tub/shower bench    Recommendations for Other Services Rehab consult    Precautions / Restrictions Precautions Precautions: Fall       Mobility Bed Mobility                  Transfers Overall transfer level: Needs assistance Equipment used: 1 person hand held assist Transfers: Sit to/from [employer] Sit to Stand: Min assist Stand pivot transfers: Min assist       General transfer comment: cues for hand placement.  Pt stood x 4.5 mins    Balance Overall balance assessment: Needs assistance Sitting-balance support: Feet supported Sitting balance-Leahy Scale: Fair     Standing balance support: During functional activity Standing balance-Leahy Scale: Poor                     ADL                           Toilet Transfer: Minimal assistance;Stand-pivot;BSC Toilet Transfer Details (indicate cue type and reason): increased time and asisst for balance  Toileting- Clothing Manipulation and Hygiene: Minimal assistance;Sit to/from stand       Functional mobility during ADLs: Minimal  assistance (stand pivot transfers) General ADL Comments: Pt significantly improved.  She is very motivated.  Requires several rest breaks, but motivated to continue      Vision Eye Alignment: Within Functional Limits   Ocular Range of Motion: Within Functional Limits Tracking/Visual Pursuits: Able to track stimulus in all quads without difficulty Saccades: Within functional limits           Perception     Praxis      Cognition   Behavior During Therapy: Flat affect Overall Cognitive Status: Impaired/Different from baseline Area of Impairment: Memory;Problem solving     Memory: Decreased short-term memory  Following Commands: Follows one step commands consistently;Follows one step commands with increased time     Problem Solving: Slow processing;Decreased initiation      Extremity/Trunk Assessment               Exercises General Exercises - Upper Extremity Shoulder Flexion: AROM;Both;10 reps   Shoulder Instructions       General Comments      Pertinent Vitals/ Pain       Pain Assessment: Faces Faces Pain Scale: No hurt  Home Living                                          Prior Functioning/Environment  Frequency Min 2X/week     Progress Toward Goals  OT Goals(current goals can now be found in the care plan section)  Progress towards OT goals: Progressing toward goals  ADL Goals Pt Will Perform Grooming: sitting;with supervision Pt Will Perform Upper Body Bathing: with supervision;sitting Pt Will Transfer to Toilet: with mod assist;bedside commode;stand pivot transfer Additional ADL Goal #1: Demonstrate emergent awreness during ADL task in non distracting environment with min redirectional cues.  Plan Discharge plan remains appropriate    Co-evaluation                 End of Session     Activity Tolerance Patient tolerated treatment well   Patient Left in chair;with call bell/phone within  reach;with family/visitor present   Nurse Communication Mobility status        Time: 8453-6468 OT Time Calculation (min): 24 min  Charges: OT General Charges $OT Visit: 1 Procedure OT Treatments $Self Care/Home Management : 8-22 mins $Therapeutic Activity: 8-22 mins  Dylin Breeden M 01/28/2014, 9:20 PM

## 2014-01-28 NOTE — Progress Notes (Signed)
Family Medicine Teaching Service Daily Progress Note Intern Pager: [phone]  Patient name: [name] [name] Medical record number: [id number] Date of birth: June 04, 1950 Age: 63 y.o. Gender: female  Primary Care Provider: Tonye Pearson, MD Consultants: Cardiology, Neurology, ID Code Status: Full Code  Assessment and Plan: [name] [name] is a 63 y.o. female presenting with altered mental status suspected due to meningitis. PMH is significant for depression, anxiety, hypertension and hyperlipidemia.   #Acute encephalopathy secondary to Sepsis from meningitis - Work up consistent with bacterial meningitis (CSF - >600 protein, <2 glucose, WBC 11321 (90% neutrophils).  - Abx: CTX (9/19 >>>) (Day#10)  -Antibiotic Day #11 of of 14 day course; last dose due 10/1  - Vanc (9/18-9/21)  - Ampicillin (9/19-9/20)  - Zosyn (9/18 - 9/19)  - Acyclovir (9/18 - 9/19) - Steroids stopped on 9/23 - Neurology consulted: continue with current treatment - MRI- Findings consistent with known meningitis. Scattered foci of diffusion restriction are stable to slightly decreased in conspicuity. No evidence of acute infarct or brain abscess - Speech Eval- Dys 2 (Finely Chopped) Diet with thin liquids. 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  -Echo to determine LV function: EF 55-60%; abnormal left ventricular relaxation (grade 1 diastolic dysfunction).  #HTN- normotensive - continue home Lisinopril  - continue Coreg  FEN/GI: Dys 2 (Finely Chopped) Diet with thin liquids, NS KVO, Senna PRN PPx: Heparin  Disposition: Transfer to CIR at discharge once bed is  available.  Subjective:  No acute complaints overnight. States cough has improved.  No bowel movements in two days. Complains of new right sided hip pain since last night.  Complains of "thumping" in left ear.  Family feels that hearing has gotten mildly worse. Has been ambulating to bed-side toilet and to chair. No further complaints today.  Objective: Temp:  [98.3 F (36.8 C)-99 F (37.2 C)] 98.3 F (36.8 C) (09/28 0519) Pulse Rate:  [64-69] 64 (09/28 0519) Resp:  [18] 18 (09/28 0519) BP: (127-145)/(58-60) 127/58 mmHg (09/28 0519) SpO2:  [98 %-100 %] 98 % (09/28 0519)  Physical Exam: General: 62yo female resting comfortably and in no apparent distress. Performing a crossword puzzle. She is alert and conversing today. Son at bedside Cardiovascular: S1 and S2 noted. No murmurs noted. Regular rhythm.  Respiratory: Transmitted upper airway sounds heard bilaterally that are loudest around nose and neck. No increased work of breathing noted. Abdomen: Bowel sounds noted. Soft and non-distended. No tenderness or masses to palpation.  Extremities: Warm and well perfused. No edema noted.  Neuro: 5/5 strength of upper extremity. 3/5 strength of bilateral lower extremity Skin: Crusted rash noted around mouth and up to right nostril. Area of right hip examined and showed no signs of pressure ulcer.   Laboratory:  Recent Labs Lab 01/23/14 1220 01/24/14 0425 01/26/14 0825  WBC 13.9* 13.9* 10.0  HGB 13.5 13.9 12.2  HCT 41.4 43.6 37.7  PLT 319 296 245    Recent Labs Lab 01/23/14 1220 01/24/14 0425 01/26/14 0825  NA 139 141 137  K 3.8 5.1 4.1  CL 106 106 100  CO2 BUN 22  28* 22  CREATININE 0.57 0.66 0.68  CALCIUM 8.6 8.6 8.3*  GLUCOSE 128* 99 97   Lactic acid - 3.27  CSF - >600 protein, <2 glucose, WBC 11321 (90% neutrophils).  Micro: Blood cx (9/18)- gram positive cocci; Strep pneumonia CSF cx (9/18)- WBC present both PMN and mononuclear; no organisms seen Urine cx  (9/18)- no growth to date RPR- nonreactive HIV- nonreactive Strep Pneumo urinary antigen- positive  Hemoglobin A1C- 6.3  Imaging/Diagnostic Tests: --01/18/2014 Head CT with Contrast: There is slight asymmetry of the cerebellar peduncles with the left cerebellar peduncle slightly lower in density relative to the right. This may reflect normal variance versus an infarct. If there is further clinical concern recommend MRI. Otherwise no acute intracranial pathology.   --01/18/2014 CXR: Hypoventilation with mild bibasilar atelectasis.   --Echo- Left ventricle: EF 55-60%; abnormal left ventricular relaxation (grade 1 diastolic dysfunction).   --01/21/2014 MRI Head- Diffusion and other more subtle signal abnormalities in the brain, new compared to 05/17/2013, most suggestive of acute meningitis / ventriculitis in this setting. It is possible there is a superimposed small right inferior cerebellar infarct located near an emissary vein. However, favor instead this is infectious and related to #1.   --01/27/14 MRI- Findings consistent with known meningitis. Scattered foci of diffusion restriction are stable to slightly decreased in conspicuity. No evidence of acute infarct or brain abscess  Araceli Bouche, DO 01/28/2014, 7:22 AM PGY-2, [facility] Family Medicine FPTS Intern pager: [phone], text pages welcome

## 2014-01-28 NOTE — PMR Pre-admission (Signed)
PMR Admission Coordinator Pre-Admission Assessment  Patient: [name] [name] is an 63 y.o., female MRN: [id number] DOB: 15-Oct-1950 Height: 5' 6.53" (169 cm) Weight: 108.5 kg (239 lb 3.2 oz)              Insurance Information HMO:     PPO:      PCP:      IPA:      80/20:      OTHER: Fee for service/standard option PRIMARY: Sharen Counter      Policy#: [id number]      Subscriber: pt CM Name: Maryruth Hancock      Phone#: [phone]     Fax#: [phone] Pre-Cert#: [id number]      Employer: retired IRS cert for 7 days Benefits:  Phone #: [phone]     Name: 9/25 Eff. Date: 05/2006     Deduct: $350      Out of Pocket Max: $5000      Life Max: none CIR: $250 copay per admission and then covers 100%      SNF: no benefit Outpatient: $20 copay per visit     Co-Pay: 75 combined visits Home Health: 85%      Co-Pay: 50 visits combined DME: 85%     Co-Pay: 15% Providers: in network  SECONDARY: none       Medicaid Application Date:       Case Manager:  Disability Application Date:       Case Worker:   Emergency Conservator, museum/gallery Information   Name Relation Home Work Mobile   [place] Sister [phone]  [phone]   Roberson,Reginald Relative   [phone]     Current Medical History  Patient Admitting Diagnosis:Bacterial Meningitis  History of Present Illness: [name] [name] is a 63 y.o. female with history of HTN, depression, anxiety disorder who was admitted on 01/18/14 with lethargy, fever 101.3 with leucocytosis and elevated troponin levels. She was started on IV antibiotics for SIRS and blood cultures done positive for strep Pneumo. LP with elevated protein >600 and WBC 11321. She was started on steroids as well as Vanc, Ceftriaxone and ampicillin for bacterial meningitis. CT head with slight asymmetry of cerebellar peduncles and Dr. Roseanne Reno recommended MRI brain for work up. MRI brain with diffusion abnormality around atrial of lateral ventricles and anterior to both  frontal horns suggestive of meningitis and ventriculitis. ID consulted for input and recommends 14 days of IV ceftriaxone for treatment of Meningitis, ventriculitis and pneumococcal bacteremia. 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 Cardiology feels that patient with demand ischemia rather than NSTEMI. Patient with normal Myoview in August and 2 D echo with EF 55-60% with no wall abnormality and recommends outpatient Myoview as patient asymptomatic.   Antibioitic day 11 of 14 day course 9/28. Steroids stopped 9/23. MRI- Findings consistent with known meningitis. Scattered foci of diffusion restriction are stable to slightly decreased in conspicuity. No evidence of acute infarct or brain abscess. On 01/27/14.   Past Medical History  Past Medical History  Diagnosis Date  . Cataract   . Depression   . Anxiety   . HTN (hypertension)   . Hyperlipemia     Family History  family history includes Heart attack (age of onset: 54) in her father; Heart attack (age of onset: 67) in her mother; Hypertension in her mother; Stroke in her mother.  Prior Rehab/Hospitalizations: none   Current Medications  Current facility-administered medications:0.9 %  sodium chloride infusion, , Intravenous, Continuous, Myra Rude, MD, Last Rate:  10 mL/hr at 01/23/14 1209, 10 mL/hr at 01/23/14 1209;  acetaminophen (TYLENOL) suppository 650 mg, 650 mg, Rectal, Q6H PRN, Myra Rude, MD;  acetaminophen (TYLENOL) tablet 650 mg, 650 mg, Oral, Q6H PRN, Myra Rude, MD, 650 mg at 01/26/14 1514 acyclovir ointment (ZOVIRAX) 5 %, , Topical, Q3H, Jinger Neighbors, NP;  antiseptic oral rinse (CPC / CETYLPYRIDINIUM CHLORIDE 0.05%) solution 7 mL, 7 mL, Mouth Rinse, BID, Myra Rude, MD, 7 mL at 01/29/14 1000;  aspirin chewable tablet 81 mg, 81 mg, Oral, Daily, Jacquiline Doe, MD, 81 mg at 01/29/14 1044;  carbamide peroxide (DEBROX) 6.5 % otic solution 5 drop, 5 drop, Left Ear, BID, [place] N Rumley, DO carvedilol  (COREG) tablet 3.125 mg, 3.125 mg, Oral, BID WC, Marykay Lex, MD, 3.125 mg at 01/29/14 1610;  cefTRIAXone (ROCEPHIN) 2 g in dextrose 5 % 50 mL IVPB, 2 g, Intravenous, Q12H, Jayce G Cook, DO, 2 g at 01/29/14 1045;  escitalopram (LEXAPRO) tablet 10 mg, 10 mg, Oral, Daily, [place] N Rumley, DO, 10 mg at 01/29/14 1044 feeding supplement (ENSURE COMPLETE) (ENSURE COMPLETE) liquid 237 mL, 237 mL, Oral, BID BM, Ailene Ards, RD, 237 mL at 01/29/14 1000;  heparin injection 5,000 Units, 5,000 Units, Subcutaneous, 3 times per day, Myra Rude, MD, 5,000 Units at 01/29/14 0631;  hydrALAZINE (APRESOLINE) injection 5 mg, 5 mg, Intravenous, Q6H PRN, [place] N Rumley, DO lisinopril (PRINIVIL,ZESTRIL) tablet 20 mg, 20 mg, Oral, Daily, [place] N Rumley, DO, 20 mg at 01/29/14 1044;  LORazepam (ATIVAN) injection 1 mg, 1 mg, Intravenous, Q6H PRN, Tommie Sams, DO, 1 mg at 01/24/14 0025;  magic mouthwash w/lidocaine, 2 mL, Oral, TID, [place] N Rumley, DO, 2 mL at 01/29/14 1000;  morphine 2 MG/ML injection 1 mg, 1 mg, Intravenous, Q2H PRN, Tommie Sams, DO, 1 mg at 01/23/14 0016 senna (SENOKOT) tablet 8.6 mg, 1 tablet, Oral, Daily PRN, [place] N Rumley, DO;  simvastatin (ZOCOR) tablet 20 mg, 20 mg, Oral, q1800, [place] N Rumley, DO, 20 mg at 01/28/14 1759;  valACYclovir (VALTREX) tablet 1,000 mg, 1,000 mg, Oral, TID, Ginnie Smart, MD, 1,000 mg at 01/29/14 1045;  vitamin A & D ointment, , Topical, PRN, Janit Pagan, MD  Patients Current Diet: Dysphagia 2 diet with thin liquids  Precautions / Restrictions Precautions Precautions: Fall Restrictions Weight Bearing Restrictions: No   Prior Activity Level Community (5-7x/wk): independent and living alone pta; driving  Journalist, newspaper / Equipment Home Assistive Devices/Equipment: None Home Equipment: None  Prior Functional Level Prior Function Level of Independence: Independent  Current Functional Level Cognition  Overall Cognitive  Status: Impaired/Different from baseline Current Attention Level: Focused Orientation Level: Oriented X4 Following Commands: Follows one step commands consistently;Follows one step commands with increased time Safety/Judgement: Decreased awareness of safety;Decreased awareness of deficits General Comments: greater than 10 second delay in processing. very lethargic. Family states pt did not sleep well last night.  Pt family actually shared that pt has trouble sleeping at night at home and sleeps during day even when home.      Extremity Assessment (includes Sensation/Coordination)          ADLs  Overall ADL's : Needs assistance/impaired Eating/Feeding: Maximal assistance Eating/Feeding Details (indicate cue type and reason): family trying to feed pt. Educated family/friends on not trying to feed pt when she is so lethargic due to risk of aspiration. Friends verbalized understanding. Encouraged family to "assist" pt by having pt sit completely upright and have pt try  to feed self and hold cup, etc. Grooming: Maximal assistance;Sitting (supported) Upper Body Bathing: Total assistance;Bed level Lower Body Bathing: Total assistance;Bed level Upper Body Dressing : Total assistance;Sitting Lower Body Dressing: Total assistance;Bed level Toilet Transfer: Minimal assistance;Stand-pivot;BSC Toilet Transfer Details (indicate cue type and reason): increased time and asisst for balance  Toileting- Clothing Manipulation and Hygiene: Minimal assistance;Sit to/from stand Functional mobility during ADLs: Minimal assistance (stand pivot transfers) General ADL Comments: Pt significantly improved.  She is very motivated.  Requires several rest breaks, but motivated to continue    Mobility  Overal bed mobility: Needs Assistance Bed Mobility: Supine to Sit Supine to sit: Supervision;HOB elevated Sit to supine: Total assist;+2 for physical assistance General bed mobility comments: Able to initiate movement  with rail and HOB elevated    Transfers  Overall transfer level: Needs assistance Equipment used: Rolling walker (2 wheeled) Transfers: Sit to/from Stand Sit to Stand: Min assist Stand pivot transfers: Min assist General transfer comment: cueing for proper hand placement.  Pt with decreased recall for hand placement within session.    Ambulation / Gait / Stairs / Wheelchair Mobility  Ambulation/Gait Ambulation/Gait assistance: Architect (Feet): 25 Feet Assistive device: Rolling walker (2 wheeled) Gait Pattern/deviations: Step-to pattern;Decreased step length - right;Decreased step length - left;Trunk flexed Gait velocity: decreased General Gait Details: Slow cadence with a definite step to/ start-stop pattern    Posture / Balance Dynamic Sitting Balance Sitting balance - Comments: Needed mod to min assist for sitting EOB for 4 minutes.      Special needs/care consideration Skin blistering around lips, now drying. On Valtrex Bowel mgmt: continent Bladder mgmt: continent Pt noted HOH new in left ear. Question wax build up. Iv antibiotics day 8 of planned 14. Sister questions if she will be on home anitbiotics per discussion with Dr. Leveda Anna on 01/29/2104   Previous Home Environment Living Arrangements: Alone  Lives With: Alone Available Help at Discharge: Family;Friend(s);Available 24 hours/day Type of Home: House Home Layout: Two level;Bed/bath upstairs;1/2 bath on main level Alternate Level Stairs-Rails: Right;Left Alternate Level Stairs-Number of Steps: 5 steps  a landing and then 8 steps Home Access: Stairs to enter Entrance Stairs-Rails: None Entrance Stairs-Number of Steps: 1 step from the garage into the kitchen Bathroom Shower/Tub: Engineer, manufacturing systems: Standard Bathroom Accessibility: Yes How Accessible: Accessible via walker Home Care Services: No Additional Comments: 1/2 bath downstairs  Discharge Living Setting Plans for Discharge  Living Setting: Patient's home;Alone Type of Home at Discharge: House Discharge Home Layout: Two level;1/2 bath on main level;Bed/bath upstairs Alternate Level Stairs-Rails: Right;Left Alternate Level Stairs-Number of Steps: 5 steps then a landing and then 8 steps Discharge Home Access: Stairs to enter Entrance Stairs-Rails: None Entrance Stairs-Number of Steps: 1 step entry from the garage into the kitchen Discharge Bathroom Shower/Tub: Tub/shower unit Discharge Bathroom Toilet: Standard Discharge Bathroom Accessibility: Yes How Accessible: Accessible via walker Does the patient have any problems obtaining your medications?: No  Social/Family/Support Systems Patient Roles: Parent Contact Information: Rosaura Carpenter, sister Anticipated Caregiver: sisters in pt's home Anticipated Caregiver's Contact Information: see above Ability/Limitations of Caregiver: sisters will care for pt in her own home Caregiver Availability: Other (Comment) (but Dondra Spry seems hesitant to totally commit to 24/7. Lives clo) Discharge Plan Discussed with Primary Caregiver: Yes Is Caregiver In Agreement with Plan?: Yes Does Caregiver/Family have Issues with Lodging/Transportation while Pt is in Rehab?: No Sister, Dondra Spry, lives close by. She seems hesitant to commit to 24/7 supervision, but pt's mentation  has greatly improved over past three days  Goals/Additional Needs Patient/Family Goal for Rehab: supervision wiht PT, OT and SLP Expected length of stay: ELOS 14-18 days Special Service Needs: sisters have been stayin gwith pt 24/7. Dondra Spry from [place], another sister from [facility], son is here for a week who is a Arts development officer stationed in [place] Pt/Family Agrees to Admission and willing to participate: Yes Program Orientation Provided & Reviewed with Pt/Caregiver Including Roles  & Responsibilities: Yes  Decrease burden of Care through IP rehab admission: n/a  Possible need for SNF placement upon discharge:not  anticipated  Patient Condition: This patient's medical and functional status has changed since the consult dated: 01/24/2014 in which the Rehabilitation Physician determined and documented that the patient's condition is appropriate for intensive rehabilitative care in an inpatient rehabilitation facility. See "History of Present Illness" (above) for medical update. Functional changes are: overall min to mod assist. Patient's medical and functional status update has been discussed with the Rehabilitation physician and patient remains appropriate for inpatient rehabilitation. Will admit to inpatient rehab today.  Preadmission Screen Completed By:  Clois Dupes, 01/29/2014 12:04 PM ______________________________________________________________________   Discussed status with Dr. Riley Kill on 01/29/2014 at  1206 and received telephone approval for admission today.  Admission Coordinator:  Clois Dupes, time 1610 Date 01/29/2014.

## 2014-01-29 ENCOUNTER — Inpatient Hospital Stay (HOSPITAL_COMMUNITY)
Admission: RE | Admit: 2014-01-29 | Discharge: 2014-02-09 | DRG: 947 | Disposition: A | Discharge status: Home Health | Payer: Federal, State, Local not specified - PPO | Source: Intra-hospital | Attending: Physical Medicine & Rehabilitation | Admitting: Physical Medicine & Rehabilitation

## 2014-01-29 ENCOUNTER — Encounter (HOSPITAL_COMMUNITY): Payer: Self-pay | Admitting: *Deleted

## 2014-01-29 DIAGNOSIS — I951 Orthostatic hypotension: Secondary | ICD-10-CM | POA: Diagnosis not present

## 2014-01-29 DIAGNOSIS — R5381 Other malaise: Secondary | ICD-10-CM | POA: Diagnosis not present

## 2014-01-29 DIAGNOSIS — K5900 Constipation, unspecified: Secondary | ICD-10-CM | POA: Diagnosis not present

## 2014-01-29 DIAGNOSIS — B002 Herpesviral gingivostomatitis and pharyngotonsillitis: Secondary | ICD-10-CM

## 2014-01-29 DIAGNOSIS — F329 Major depressive disorder, single episode, unspecified: Secondary | ICD-10-CM | POA: Diagnosis not present

## 2014-01-29 DIAGNOSIS — E871 Hypo-osmolality and hyponatremia: Secondary | ICD-10-CM | POA: Diagnosis not present

## 2014-01-29 DIAGNOSIS — H40009 Preglaucoma, unspecified, unspecified eye: Secondary | ICD-10-CM

## 2014-01-29 DIAGNOSIS — I10 Essential (primary) hypertension: Secondary | ICD-10-CM

## 2014-01-29 DIAGNOSIS — B9689 Other specified bacterial agents as the cause of diseases classified elsewhere: Secondary | ICD-10-CM

## 2014-01-29 DIAGNOSIS — D509 Iron deficiency anemia, unspecified: Secondary | ICD-10-CM

## 2014-01-29 DIAGNOSIS — G009 Bacterial meningitis, unspecified: Secondary | ICD-10-CM

## 2014-01-29 DIAGNOSIS — F341 Dysthymic disorder: Secondary | ICD-10-CM | POA: Diagnosis not present

## 2014-01-29 DIAGNOSIS — I248 Other forms of acute ischemic heart disease: Secondary | ICD-10-CM

## 2014-01-29 DIAGNOSIS — Z79899 Other long term (current) drug therapy: Secondary | ICD-10-CM

## 2014-01-29 DIAGNOSIS — R413 Other amnesia: Secondary | ICD-10-CM

## 2014-01-29 DIAGNOSIS — Z87891 Personal history of nicotine dependence: Secondary | ICD-10-CM | POA: Diagnosis not present

## 2014-01-29 DIAGNOSIS — D649 Anemia, unspecified: Secondary | ICD-10-CM

## 2014-01-29 DIAGNOSIS — A403 Sepsis due to Streptococcus pneumoniae: Secondary | ICD-10-CM | POA: Diagnosis not present

## 2014-01-29 DIAGNOSIS — R531 Weakness: Principal | ICD-10-CM

## 2014-01-29 DIAGNOSIS — I219 Acute myocardial infarction, unspecified: Secondary | ICD-10-CM | POA: Diagnosis present

## 2014-01-29 DIAGNOSIS — R262 Difficulty in walking, not elsewhere classified: Secondary | ICD-10-CM

## 2014-01-29 DIAGNOSIS — E785 Hyperlipidemia, unspecified: Secondary | ICD-10-CM | POA: Diagnosis not present

## 2014-01-29 DIAGNOSIS — Z7982 Long term (current) use of aspirin: Secondary | ICD-10-CM | POA: Diagnosis not present

## 2014-01-29 MED ORDER — VITAMINS A & D EX OINT
TOPICAL_OINTMENT | CUTANEOUS | Status: DC | PRN
  Filled 2014-01-29: qty 5

## 2014-01-29 MED ORDER — VALACYCLOVIR HCL 500 MG PO TABS
1000.0000 mg | ORAL_TABLET | Freq: Three times a day (TID) | ORAL | Status: DC
  Administered 2014-01-29 – 2014-01-30 (×4): 1000 mg via ORAL
  Filled 2014-01-29 (×8): qty 2

## 2014-01-29 MED ORDER — CARBAMIDE PEROXIDE 6.5 % OT SOLN
5.0000 [drp] | Freq: Two times a day (BID) | OTIC | Status: DC
  Administered 2014-01-29 – 2014-02-09 (×22): 5 [drp] via OTIC
  Filled 2014-01-29: qty 15

## 2014-01-29 MED ORDER — DIPHENHYDRAMINE HCL 12.5 MG/5ML PO ELIX: 12.5000 mg | ORAL_SOLUTION | Freq: Four times a day (QID) | ORAL | Status: DC | PRN

## 2014-01-29 MED ORDER — TRAZODONE HCL 50 MG PO TABS
25.0000 mg | ORAL_TABLET | Freq: Every evening | ORAL | Status: DC | PRN
  Administered 2014-02-03 – 2014-02-04 (×2): 50 mg via ORAL
  Filled 2014-01-29 (×2): qty 1

## 2014-01-29 MED ORDER — ENSURE COMPLETE PO LIQD
237.0000 mL | Freq: Two times a day (BID) | ORAL | Status: DC
  Administered 2014-01-30 – 2014-02-05 (×2): 237 mL via ORAL

## 2014-01-29 MED ORDER — SENNA 8.6 MG PO TABS: 1.0000 | ORAL_TABLET | Freq: Every day | ORAL | Status: DC | PRN

## 2014-01-29 MED ORDER — ASPIRIN 81 MG PO CHEW
81.0000 mg | CHEWABLE_TABLET | Freq: Every day | ORAL | Status: DC
Start: 2014-01-30 — End: 2014-02-09
  Administered 2014-01-30 – 2014-02-09 (×11): 81 mg via ORAL
  Filled 2014-01-29 (×11): qty 1

## 2014-01-29 MED ORDER — PROCHLORPERAZINE EDISYLATE 5 MG/ML IJ SOLN
5.0000 mg | Freq: Four times a day (QID) | INTRAMUSCULAR | Status: DC | PRN
  Filled 2014-01-29: qty 2

## 2014-01-29 MED ORDER — MAGIC MOUTHWASH W/LIDOCAINE
2.0000 mL | Freq: Three times a day (TID) | ORAL | Status: DC
  Administered 2014-01-29 – 2014-02-09 (×32): 2 mL via ORAL
  Filled 2014-01-29 (×35): qty 5

## 2014-01-29 MED ORDER — ENOXAPARIN SODIUM 40 MG/0.4ML ~~LOC~~ SOLN
40.0000 mg | SUBCUTANEOUS | Status: DC
  Administered 2014-01-29 – 2014-02-08 (×11): 40 mg via SUBCUTANEOUS
  Filled 2014-01-29 (×12): qty 0.4

## 2014-01-29 MED ORDER — ESCITALOPRAM OXALATE 10 MG PO TABS
10.0000 mg | ORAL_TABLET | Freq: Every day | ORAL | Status: DC
  Administered 2014-01-30 – 2014-02-09 (×11): 10 mg via ORAL
  Filled 2014-01-29 (×13): qty 1

## 2014-01-29 MED ORDER — ACYCLOVIR 5 % EX OINT
TOPICAL_OINTMENT | CUTANEOUS | Status: AC
  Administered 2014-01-29 – 2014-01-31 (×15): via TOPICAL
  Filled 2014-01-29 (×2): qty 30

## 2014-01-29 MED ORDER — CETYLPYRIDINIUM CHLORIDE 0.05 % MT LIQD
7.0000 mL | Freq: Two times a day (BID) | OROMUCOSAL | Status: DC
  Administered 2014-01-29 – 2014-02-09 (×21): 7 mL via OROMUCOSAL

## 2014-01-29 MED ORDER — ACETAMINOPHEN 325 MG PO TABS
325.0000 mg | ORAL_TABLET | ORAL | Status: DC | PRN
  Administered 2014-01-31: 650 mg via ORAL
  Filled 2014-01-29 (×2): qty 2

## 2014-01-29 MED ORDER — SIMVASTATIN 20 MG PO TABS
20.0000 mg | ORAL_TABLET | Freq: Every day | ORAL | Status: DC
  Administered 2014-01-29 – 2014-02-08 (×11): 20 mg via ORAL
  Filled 2014-01-29 (×12): qty 1

## 2014-01-29 MED ORDER — ALUM & MAG HYDROXIDE-SIMETH 200-200-20 MG/5ML PO SUSP: 30.0000 mL | ORAL | Status: DC | PRN

## 2014-01-29 MED ORDER — PROCHLORPERAZINE 25 MG RE SUPP
12.5000 mg | Freq: Four times a day (QID) | RECTAL | Status: DC | PRN
  Filled 2014-01-29: qty 1

## 2014-01-29 MED ORDER — CARVEDILOL 3.125 MG PO TABS
3.1250 mg | ORAL_TABLET | Freq: Two times a day (BID) | ORAL | Status: DC
  Administered 2014-01-29 – 2014-02-09 (×21): 3.125 mg via ORAL
  Filled 2014-01-29 (×24): qty 1

## 2014-01-29 MED ORDER — ACYCLOVIR 5 % EX OINT: TOPICAL_OINTMENT | CUTANEOUS | Status: DC

## 2014-01-29 MED ORDER — DEXTROSE 5 % IV SOLN
2.0000 g | Freq: Two times a day (BID) | INTRAVENOUS | Status: AC
  Administered 2014-01-29 – 2014-01-31 (×5): 2 g via INTRAVENOUS
  Filled 2014-01-29 (×8): qty 2

## 2014-01-29 MED ORDER — GUAIFENESIN-DM 100-10 MG/5ML PO SYRP: 5.0000 mL | ORAL_SOLUTION | Freq: Four times a day (QID) | ORAL | Status: DC | PRN

## 2014-01-29 MED ORDER — FLEET ENEMA 7-19 GM/118ML RE ENEM: 1.0000 | ENEMA | Freq: Once | RECTAL | Status: AC | PRN

## 2014-01-29 MED ORDER — MAGIC MOUTHWASH W/LIDOCAINE: 2.0000 mL | Freq: Three times a day (TID) | ORAL | Status: DC

## 2014-01-29 MED ORDER — PROCHLORPERAZINE MALEATE 5 MG PO TABS
5.0000 mg | ORAL_TABLET | Freq: Four times a day (QID) | ORAL | Status: DC | PRN
  Filled 2014-01-29: qty 2

## 2014-01-29 MED ORDER — BISACODYL 10 MG RE SUPP
10.0000 mg | Freq: Every day | RECTAL | Status: DC | PRN
  Filled 2014-01-29: qty 1

## 2014-01-29 MED ORDER — CARBAMIDE PEROXIDE 6.5 % OT SOLN
5.0000 [drp] | Freq: Two times a day (BID) | OTIC | Status: DC
  Administered 2014-01-29: 5 [drp] via OTIC
  Filled 2014-01-29 (×2): qty 15

## 2014-01-29 MED ORDER — LISINOPRIL 20 MG PO TABS
20.0000 mg | ORAL_TABLET | Freq: Every day | ORAL | Status: DC
  Administered 2014-01-30: 20 mg via ORAL
  Filled 2014-01-29 (×3): qty 1

## 2014-01-29 MED ORDER — CARBAMIDE PEROXIDE 6.5 % OT SOLN: 5.0000 [drp] | Freq: Two times a day (BID) | OTIC | Status: DC

## 2014-01-29 MED ORDER — ACYCLOVIR 5 % EX OINT: TOPICAL_OINTMENT | CUTANEOUS | Status: AC

## 2014-01-29 NOTE — Progress Notes (Signed)
I have insurance approval to admit pt to inpt rehab and will make the arrangements to admit today. RN CM and SW are aware. [phone]

## 2014-01-29 NOTE — Progress Notes (Signed)
Family Medicine Teaching Service Daily Progress Note Intern Pager: [phone]  Patient name: [name] [name] Medical record number: [id number] Date of birth: [birth year] Age: 63 y.o. Gender: female  Primary Care Provider: Tonye Pearson, MD Consultants: Cardiology, Neurology, ID Code Status: Full Code  Assessment and Plan: [name] [name] is a 63 y.o. female presenting with altered mental status suspected due to meningitis. PMH is significant for depression, anxiety, hypertension and hyperlipidemia.   #Acute encephalopathy secondary to Sepsis from meningitis - Work up consistent with bacterial meningitis (CSF - >600 protein, <2 glucose, WBC 11321 (90% neutrophils).  - Abx: CTX (9/19 >>>) (Day#11)  -Antibiotic Day #12 of of 14 day course; last dose due 10/1  - Vanc (9/18-9/21)  - Ampicillin (9/19-9/20)  - Zosyn (9/18 - 9/19)  - Acyclovir (9/18 - 9/19) - Steroids stopped on 9/23 - Neurology consulted: continue with current treatment - MRI- Findings consistent with known meningitis. Scattered foci of diffusion restriction are stable to slightly decreased in conspicuity. No evidence of acute infarct or brain abscess - Speech Eval- Dys 2 (Finely Chopped) Diet with thin liquids. - Advance diet as tolerated  # HSV Stomatitis- noted around mouth and right nostril - ID consulted- Valtrex Day #6 of 7 day course - Magic Mouthwash TID for pain  #Elevated Troponin - Likely secondary to demand ischemia in setting of Sepsis - Troponin trended down to 1.52 - Negative Myoview study in August 2014 - Cardiology consulted and appreciate recs  -Plan for Myoview as outpatient.  -Echo to determine LV function: EF 55-60%; abnormal left ventricular relaxation (grade 1 diastolic dysfunction).  #HTN- normotensive - continue home Lisinopril  - continue Coreg  FEN/GI: Dys 2 (Finely Chopped) Diet with thin liquids, NS KVO, Senna PRN PPx: Heparin  Disposition: Transfer to CIR at discharge once bed is  available.  Subjective:  No acute complaints overnight. Right sided hip pain has improved since yesterday. "Thumping" in left ear has resolved, however she still notes muffled sounds in left ear.  Has been ambulating to bed-side toilet and to chair. No further complaints today.  Objective: Temp:  [97.9 F (36.6 C)-98.2 F (36.8 C)] 97.9 F (36.6 C) (09/29 0528) Pulse Rate:  [62-66] 63 (09/29 0528) Resp:  [18] 18 (09/29 0528) BP: (125-137)/(49-78) 137/77 mmHg (09/29 0528) SpO2:  [99 %-100 %] 100 % (09/29 0528)  Physical Exam: General: 62yo female resting comfortably and in no apparent distress. Performing a crossword puzzle. She is alert and conversing today. Son at bedside HEENT: Hard cerumen noted in left ear, left tympanic membrane not visualized. Right tympanic membrane normal. Cardiovascular: S1 and S2 noted. No murmurs noted. Regular rhythm.  Respiratory: Transmitted upper airway sounds heard bilaterally that are loudest around nose and neck. No increased work of breathing noted. Abdomen: Bowel sounds noted. Soft and non-distended. No tenderness or masses to palpation.  Extremities: Warm and well perfused. No edema noted.  Neuro: 5/5 strength of upper extremity. 3/5 strength of bilateral lower extremity Skin: Crusted rash noted around mouth and up to right nostril. Area of right hip examined and showed no signs of pressure ulcer.   Laboratory:  Recent Labs Lab 01/24/14 0425 01/26/14 0825 01/28/14 1118  WBC 13.9* 10.0 7.1  HGB 13.9 12.2 11.8*  HCT 43.6 37.7 37.2  PLT 296 245 278    Recent Labs Lab 01/24/14 0425 01/26/14 0825 01/28/14 1118  NA 141 137 134*  K 5.1 4.1 4.4  CL 106 100 101  CO2 21 26 24  BUN 28* 22 18  CREATININE 0.66 0.68 0.62  CALCIUM 8.6 8.3* 8.4  GLUCOSE 99 97 104*   Lactic acid - 3.27  CSF - >600 protein, <2 glucose, WBC 11321 (90% neutrophils).  Micro: Blood cx (9/18)- gram positive cocci; Strep pneumonia CSF cx (9/18)- WBC present both  PMN and mononuclear; no organisms seen Urine cx (9/18)- no growth to date RPR- nonreactive HIV- nonreactive Strep Pneumo urinary antigen- positive  Hemoglobin A1C- 6.3  Imaging/Diagnostic Tests: --01/18/2014 Head CT with Contrast: There is slight asymmetry of the cerebellar peduncles with the left cerebellar peduncle slightly lower in density relative to the right. This may reflect normal variance versus an infarct. If there is further clinical concern recommend MRI. Otherwise no acute intracranial pathology.   --01/18/2014 CXR: Hypoventilation with mild bibasilar atelectasis.   --Echo- Left ventricle: EF 55-60%; abnormal left ventricular relaxation (grade 1 diastolic dysfunction).   --01/21/2014 MRI Head- Diffusion and other more subtle signal abnormalities in the brain, new compared to 05/17/2013, most suggestive of acute meningitis / ventriculitis in this setting. It is possible there is a superimposed small right inferior cerebellar infarct located near an emissary vein. However, favor instead this is infectious and related to #1.   --01/27/14 MRI- Findings consistent with known meningitis. Scattered foci of diffusion restriction are stable to slightly decreased in conspicuity. No evidence of acute infarct or brain abscess  [name] Bouchealeigh N Rumley, DO 01/29/2014, 9:31 AM PGY-1, [facility] FPTS Intern pager: [phone], text pages welcome

## 2014-01-29 NOTE — Progress Notes (Signed)
Physical Therapy Treatment Patient Details Name: [name] [name] MRN: [id number] DOB: 11/26/1950 Today's Date: 01/29/2014    History of Present Illness 63 y.o. female with a PMHx of hypertension, hyperlipidemia, history of cigarette smoking, who was admitted to [facility] on 01/18/2014 for evaluation of altered mental status. She was diagnosed as having meningitis    PT Comments    Pt progressing well but with decreased processing time.  Cues for safety with hand placement with transfers and slow step to gait pattern. Con't to recommend CIR.   Follow Up Recommendations  CIR;Supervision/Assistance - 24 hour     Equipment Recommendations  None recommended by PT    Recommendations for Other Services       Precautions / Restrictions Precautions Precautions: Fall Restrictions Weight Bearing Restrictions: No    Mobility  Bed Mobility   Bed Mobility: Supine to Sit     Supine to sit: Supervision;HOB elevated     General bed mobility comments: Able to initiate movement with rail and HOB elevated  Transfers Overall transfer level: Needs assistance Equipment used: Rolling walker (2 wheeled) Transfers: Sit to/from Stand Sit to Stand: Min assist Stand pivot transfers: Min assist       General transfer comment: cueing for proper hand placement.  Pt with decreased recall for hand placement within session.  Ambulation/Gait Ambulation/Gait assistance: Min assist Ambulation Distance (Feet): 25 Feet Assistive device: Rolling walker (2 wheeled) Gait Pattern/deviations: Step-to pattern;Decreased step length - right;Decreased step length - left;Trunk flexed Gait velocity: decreased   General Gait Details: Slow cadence with a definite step to/ start-stop pattern   Stairs            Wheelchair Mobility    Modified Rankin (Stroke Patients Only)       Balance   Sitting-balance support: Feet supported Sitting balance-Leahy Scale: Fair     Standing balance support:  During functional activity Standing balance-Leahy Scale: Fair Standing balance comment: Able to clean self at [facility] with 1 UE on RW.                    Cognition Arousal/Alertness: Awake/alert Behavior During Therapy: Flat affect Overall Cognitive Status: Impaired/Different from baseline Area of Impairment: Memory;Problem solving     Memory: Decreased short-term memory Following Commands: Follows one step commands consistently;Follows one step commands with increased time     Problem Solving: Slow processing      Exercises General Exercises - Lower Extremity Ankle Circles/Pumps: Both;15 reps;Seated Long Arc Quad: AROM;Both;15 reps;Seated Hip ABduction/ADduction: AROM;Both;15 reps;Seated Hip Flexion/Marching: AROM;Both;15 reps;Seated    General Comments General comments (skin integrity, edema, etc.): Pt reported some dizziness at end of gait that subsided with sitting in recliner.      Pertinent Vitals/Pain Pain Assessment: Faces Pain Score: 0-No pain Faces Pain Scale: Hurts a little bit Pain Location: R hip with ther ex Pain Descriptors / Indicators: Other (Comment) (grabbing) Pain Intervention(s): Monitored during session    Home Living                      Prior Function            PT Goals (current goals can now be found in the care plan section) Acute Rehab PT Goals Patient Stated Goal: to go to rehab PT Goal Formulation: With patient/family Time For Goal Achievement: 02/06/14 Potential to Achieve Goals: Good Progress towards PT goals: Progressing toward goals    Frequency  Min 3X/week    PT Plan  Current plan remains appropriate    Co-evaluation             End of Session Equipment Utilized During Treatment: Gait belt Activity Tolerance: Patient tolerated treatment well Patient left: in chair;with call bell/phone within reach;with family/visitor present     Time: [id number] PT Time Calculation (min): 29 min  Charges:  $Gait  Training: 8-22 mins $Therapeutic Exercise: 8-22 mins                    G Codes:      Rayfield Beem LUBECK 01/29/2014, 11:01 AM

## 2014-01-29 NOTE — Progress Notes (Signed)
Patient arrived to [address] at [address] with her two sisters. Explained rehab routines to patient and safety plan explained. Patient verbalized understanding. No c/o pain. Cont plan of care. Batul Diego, Phill Mutter

## 2014-01-29 NOTE — Progress Notes (Signed)
Subjective: Patient continues to improve per patient and family.  She has been able to transfer independently.  Seems cognitively improved.  Does complain of some decreased hearing from the left ear.    Objective: Current vital signs: BP 137/77  Pulse 63  Temp(Src) 97.9 F (36.6 C) (Oral)  Resp 18  Ht 5' 6.53" (1.69 m)  Wt 108.5 kg (239 lb 3.2 oz)  BMI 37.99 kg/m2  SpO2 100% Vital signs in last 24 hours: Temp:  [97.9 F (36.6 C)-98.2 F (36.8 C)] 97.9 F (36.6 C) (09/29 0528) Pulse Rate:  [62-66] 63 (09/29 0528) Resp:  [18] 18 (09/29 0528) BP: (125-137)/(49-78) 137/77 mmHg (09/29 0528) SpO2:  [99 %-100 %] 100 % (09/29 0528)  Intake/Output from previous day: 09/28 0701 - 09/29 0700 In: 360 [P.O.:360] Out: 1050 [Urine:1050] Intake/Output this shift:   Nutritional status: Dysphagia  Neurologic Exam: Mental Status:  Alert, oriented, thought content appropriate. Speech fluent without evidence of aphasia. Able to follow 3 step commands without difficulty.  Cranial Nerves:  II: Discs flat bilaterally; Visual fields grossly normal, pupils equal, round, reactive to light and accommodation  III,IV, VI: ptosis not present, extra-ocular motions intact bilaterally  V,VII: smile symmetric, facial light touch sensation normal bilaterally  VIII: decreased from the left ear IX,X: gag reflex present  XI: bilateral shoulder shrug  XII: midline tongue extension without atrophy or fasciculations  Motor:  Patient able to lift all extremities against gravity Sensory: Pinprick and light touch intact throughout, bilaterally  Deep Tendon Reflexes:  2+ throughout with 1+ AJ's bilaterally  Plantars:  Right: downgoing   Left: up going   Lab Results: Basic Metabolic Panel:  Recent Labs Lab 01/23/14 1220 01/24/14 0425 01/26/14 0825 01/28/14 1118  NA 139 141 137 134*  K 3.8 5.1 4.1 4.4  CL 106 106 100 101  CO2 19 21 26 24   GLUCOSE 128* 99 97 104*  BUN 22 28* 22 18  CREATININE 0.57  0.66 0.68 0.62  CALCIUM 8.6 8.6 8.3* 8.4    Liver Function Tests: No results found for this basename: AST, ALT, ALKPHOS, BILITOT, PROT, ALBUMIN,  in the last 168 hours No results found for this basename: LIPASE, AMYLASE,  in the last 168 hours No results found for this basename: AMMONIA,  in the last 168 hours  CBC:  Recent Labs Lab 01/23/14 1220 01/24/14 0425 01/26/14 0825 01/28/14 1118  WBC 13.9* 13.9* 10.0 7.1  HGB 13.5 13.9 12.2 11.8*  HCT 41.4 43.6 37.7 37.2  MCV 78.9 79.7 80.2 82.1  PLT 319 296 245 278    Cardiac Enzymes: No results found for this basename: CKTOTAL, CKMB, CKMBINDEX, TROPONINI,  in the last 168 hours  Lipid Panel: No results found for this basename: CHOL, TRIG, HDL, CHOLHDL, VLDL, LDLCALC,  in the last 168 hours  CBG: No results found for this basename: GLUCAP,  in the last 168 hours  Microbiology: Results for orders placed during the hospital encounter of 01/18/14  URINE CULTURE     Status: None   Collection Time    01/18/14  4:32 PM      Result Value Ref Range Status   Specimen Description URINE, CATHETERIZED   Final   Special Requests NONE   Final   Culture  Setup Time     Final   Value: 01/18/2014 17:43     Performed at [facility]   Colony Count     Final   Value: NO GROWTH  Performed at [facility]     Final   Value: NO GROWTH     Performed at [facility]   Report Status 01/19/2014 FINAL   Final  CULTURE, BLOOD (ROUTINE X 2)     Status: None   Collection Time    01/18/14  4:49 PM      Result Value Ref Range Status   Specimen Description BLOOD ARM RIGHT   Final   Special Requests BOTTLES DRAWN AEROBIC AND ANAEROBIC 5CC   Final   Culture  Setup Time     Final   Value: 01/18/2014 21:55     Performed at [facility]   Culture     Final   Value: STREPTOCOCCUS PNEUMONIAE     Note: Gram Stain Report Called to,Read Back By and Verified With: CHRIS A@ 1034 ON [id number] BY [facility]     Performed  at [facility]   Report Status 01/21/2014 FINAL   Final   Organism ID, Bacteria STREPTOCOCCUS PNEUMONIAE   Final  CULTURE, BLOOD (ROUTINE X 2)     Status: None   Collection Time    01/18/14  5:01 PM      Result Value Ref Range Status   Specimen Description BLOOD HAND RIGHT   Final   Special Requests BOTTLES DRAWN AEROBIC AND ANAEROBIC 4CC   Final   Culture  Setup Time     Final   Value: 01/18/2014 21:57     Performed at [facility]   Culture     Final   Value: STREPTOCOCCUS PNEUMONIAE     Note: SUSCEPTIBILITIES PERFORMED ON PREVIOUS CULTURE WITHIN THE LAST 5 DAYS.     Note: Gram Stain Report Called to,Read Back By and Verified With: CHRIS A@ 1034 ON [id number] BY [facility]     Performed at [facility]   Report Status 01/21/2014 FINAL   Final  CSF CULTURE     Status: None   Collection Time    01/18/14  9:20 PM      Result Value Ref Range Status   Specimen Description CSF   Final   Special Requests NO 2 2CC   Final   Gram Stain     Final   Value: WBC PRESENT,BOTH PMN AND MONONUCLEAR     NO ORGANISMS SEEN     CYTOSPIN Performed at [facility]     Performed at [facility]   Culture     Final   Value: NO GROWTH 3 DAYS     Performed at [facility]   Report Status 01/22/2014 FINAL   Final  GRAM STAIN     Status: None   Collection Time    01/18/14  9:20 PM      Result Value Ref Range Status   Specimen Description CSF   Final   Special Requests NO 2 2CC   Final   Gram Stain     Final   Value: WBC PRESENT,BOTH PMN AND MONONUCLEAR     NO ORGANISMS SEEN     CYTOSPIN SLIDE   Report Status 01/18/2014 FINAL   Final  VIRAL CULTURE VIRC     Status: None   Collection Time    01/18/14  9:20 PM      Result Value Ref Range Status   Specimen Description CSF   Final   Special Requests NONE   Final   Culture     Final   Value: No  Herpes Simplex Detected in Cell Culture     Performed at [facility]   Report Status 01/23/2014 FINAL    Final  MRSA PCR SCREENING     Status: None   Collection Time    01/18/14 10:18 PM      Result Value Ref Range Status   MRSA by PCR NEGATIVE  NEGATIVE Final   Comment:            The GeneXpert MRSA Assay (FDA     approved for NASAL specimens     only), is one component of a     comprehensive MRSA colonization     surveillance program. It is not     intended to diagnose MRSA     infection nor to guide or     monitor treatment for     MRSA infections.  CULTURE, BLOOD (ROUTINE X 2)     Status: None   Collection Time    01/20/14 12:22 PM      Result Value Ref Range Status   Specimen Description BLOOD RIGHT ARM   Final   Special Requests BOTTLES DRAWN AEROBIC ONLY 8CC   Final   Culture  Setup Time     Final   Value: 01/20/2014 18:15     Performed at [facility]   Culture     Final   Value: NO GROWTH 5 DAYS     Performed at [facility]   Report Status 01/26/2014 FINAL   Final  CULTURE, BLOOD (ROUTINE X 2)     Status: None   Collection Time    01/20/14  2:10 PM      Result Value Ref Range Status   Specimen Description BLOOD RIGHT ARM   Final   Special Requests BOTTLES DRAWN AEROBIC AND ANAEROBIC 5CC   Final   Culture  Setup Time     Final   Value: 01/20/2014 18:14     Performed at [facility]   Culture     Final   Value: NO GROWTH 5 DAYS     Performed at [facility]   Report Status 01/26/2014 FINAL   Final    Coagulation Studies: No results found for this basename: LABPROT, INR,  in the last 72 hours  Imaging: Mr Laqueta Jean Wo Contrast  01/27/2014   CLINICAL DATA:  Acute encephalopathy secondary to sepsis from bacterial meningitis.  EXAM: MRI HEAD WITHOUT AND WITH CONTRAST  TECHNIQUE: Multiplanar, multiecho pulse sequences of the brain and surrounding structures were obtained without and with intravenous contrast.  CONTRAST:  75mL MULTIHANCE GADOBENATE DIMEGLUMINE 529 MG/ML IV SOLN  COMPARISON:  Noncontrast brain MRI 01/21/2014  FINDINGS:  Small, scattered foci of restricted diffusion at the atria of the lateral ventricles and right occipital horn, in the frontal lobes, and in the subarachnoid spaces, greatest at the vertex, overall appear stable to slightly decreased in conspicuity. Small focus of restricted diffusion in the inferior right cerebellum appears to have essentially resolved. No new areas of parenchymal restricted diffusion are identified.  There is no intracranial hemorrhage, mass, or midline shift. Small, scattered foci of T2 hyperintensity throughout both cerebral hemispheres appear mildly increased from 05/17/2013 and also likely slightly increased from 01/21/2014, although the most recent prior study was degraded by motion. Small focus of T2 hyperintensity in the inferior right cerebellum is unchanged, and minimal patchy T2 hyperintensity is again seen in the pons.  Small focus of enhancement is seen in the inferior right  cerebellum at the site of previously described diffusion-weighted signal abnormality. There is mild, diffuse dural thickening and enhancement. Leptomeningeal enhancement is a less dominant finding, most notable at the vertex. Mild ependymal enhancement is noted involving the atria and occipital horns of the lateral ventricles. There is no brain parenchymal abscess. Ventricles are unchanged in size. Meckel's caves are again noted to be enlarged, left greater than right, with some abnormal/complex material again seen on the left.  Orbits are unremarkable. Right sphenoid sinus fluid is noted. There is a is small to moderate-sized left mastoid effusion, similar to prior. Major intracranial vascular flow voids are preserved.  IMPRESSION: Findings consistent with known meningitis. Scattered foci of diffusion restriction are stable to slightly decreased in conspicuity. No evidence of acute infarct or brain abscess.   Electronically Signed   By: Sebastian Ache   On: 01/27/2014 11:51    Medications:  I have reviewed the  patient's current medications. Scheduled: . acyclovir ointment   Topical Q3H  . antiseptic oral rinse  7 mL Mouth Rinse BID  . aspirin  81 mg Oral Daily  . carbamide peroxide  5 drop Left Ear BID  . carvedilol  3.125 mg Oral BID WC  . cefTRIAXone (ROCEPHIN)  IV  2 g Intravenous Q12H  . escitalopram  10 mg Oral Daily  . feeding supplement (ENSURE COMPLETE)  237 mL Oral BID BM  . heparin  5,000 Units Subcutaneous 3 times per day  . lisinopril  20 mg Oral Daily  . magic mouthwash w/lidocaine  2 mL Oral TID  . simvastatin  20 mg Oral q1800  . valACYclovir  1,000 mg Oral TID    Assessment/Plan: Patient doing quite well.  Is not completely back to baseline but this is to be expected.  Family questions addressed.  Recommendations: 1.  PT 2.  No further neurologic intervention is recommended at this time.  If further questions arise, please call or page at that time.  Thank you for allowing neurology to participate in the care of this patient.    LOS: 11 days   Thana Farr, MD [facility] [phone] 01/29/2014  10:34 AM

## 2014-01-29 NOTE — H&P (Signed)
Physical Medicine and Rehabilitation Admission H&P  Chief Complaint   Patient presents with   .  Bacterial meningitis with mental status changes, BLE weakness, difficulty walking   HPI: [name] [name] is a 63 y.o. female with history of HTN, depression, anxiety disorder who was admitted on 01/18/14 with lethargy, fever 101.3 with leucocytosis and elevated troponin levels. She was started on IV antibiotics for SIRS and blood cultures done positive for strep Pneumo. LP with elevated protein >600 and WBC 11321. She was started on steroids as well as Vanc, Ceftriaxone and ampicillin for bacterial meningitis. CT head with slight asymmetry of cerebellar peduncles and Dr. Nicole Kindred recommended MRI brain for work up. MRI brain with diffusion abnormality around atrial of lateral ventricles and anterior to both frontal horns suggestive of meningitis and ventriculitis. ID consulted for input and recommends 14 days of IV ceftriaxone for treatment of Meningitis, ventriculitis and pneumococcal bacteremia. 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 Cardiology feels that patient with demand ischemia rather than NSTEMI. Patient with normal Myoview in August and 2 D echo with EF 55-60% with no wall abnormality and recommends outpatient Myoview as patient asymptomatic. Follow up MRI brain done 09/28 with slight improvement in scattered restriction foci and no acute infarct or abscess.    Review of Systems  HENT: Positive for hearing loss (left ear ).  Eyes: Negative for blurred vision and double vision.  Respiratory: Negative for cough and shortness of breath.  Cardiovascular: Negative for chest pain and palpitations.  Gastrointestinal: Positive for diarrhea. Negative for heartburn and nausea.  Genitourinary: Positive for urgency and frequency.  Musculoskeletal: Positive for back pain and myalgias.  Neurological: Negative for dizziness, tingling and headaches.  Psychiatric/Behavioral: The patient has insomnia  (chronic).   Past Medical History   Diagnosis  Date   .  Cataract    .  Depression    .  Anxiety    .  HTN (hypertension)    .  Hyperlipemia     Past Surgical History   Procedure  Laterality  Date   .  Cyst removal bilateral wrists      Family History   Problem  Relation  Age of Onset   .  Heart attack  Mother  68   .  Stroke  Mother    .  Hypertension  Mother    .  Heart attack  Father  35    Social History: Lives alone. Has two sisters in town. Retired--used to work for [employer]. She reports that she quit smoking about 2 years ago. Her smoking use included Cigarettes. She has a 20 pack-year smoking history. She does not have any smokeless tobacco history on file. She reports that she does not drink alcohol or use illicit drugs.    Allergies: No Known Allergies  Medications Prior to Admission   Medication  Sig  Dispense  Refill   .  aspirin 81 MG chewable tablet  Chew 81 mg by mouth daily.     .  diphenhydramine-acetaminophen (TYLENOL PM) 25-500 MG TABS  Take 2 tablets by mouth at bedtime as needed (for sleep).     Marland Kitchen  escitalopram (LEXAPRO) 10 MG tablet  Take 10 mg by mouth daily.     Marland Kitchen  lisinopril (PRINIVIL,ZESTRIL) 20 MG tablet  Take 20 mg by mouth daily.     .  Multiple Vitamins-Minerals (MULTIVITAMIN PO)  Take 1 tablet by mouth daily.     .  simvastatin (ZOCOR) 20 MG tablet  Take 20 mg by mouth daily.      Home:  Home Living  Family/patient expects to be discharged to:: Inpatient rehab  Living Arrangements: Alone  Available Help at Discharge: Family;Friend(s);Available 24 hours/day  Type of Home: House  Home Access: Stairs to enter  [employer] of Steps: 1 step from the garage into the kitchen  Entrance Stairs-Rails: None  Home Layout: Two level;Bed/bath upstairs;1/2 bath on main level  Alternate Level Stairs-Number of Steps: 5 steps a landing and then 8 steps  Alternate Level Stairs-Rails: Right;Left  Home Equipment: None  Additional Comments: 1/2 bath  downstairs  Lives With: Alone  Functional History:  Prior Function  Level of Independence: Independent  Functional Status:  Mobility:  Bed Mobility  Overal bed mobility: Needs Assistance  Bed Mobility: Supine to Sit  Supine to sit: Supervision;HOB elevated  Sit to supine: Total assist;+2 for physical assistance  General bed mobility comments: Able to initiate movement with rail and HOB elevated  Transfers  Overall transfer level: Needs assistance  Equipment used: Rolling walker (2 wheeled)  Transfers: Sit to/from Stand  Sit to Stand: Min assist  Stand pivot transfers: Min assist  General transfer comment: cueing for proper hand placement. Pt with decreased recall for hand placement within session.  Ambulation/Gait  Ambulation/Gait assistance: Min assist  Ambulation Distance (Feet): 25 Feet  Assistive device: Rolling walker (2 wheeled)  Gait Pattern/deviations: Step-to pattern;Decreased step length - right;Decreased step length - left;Trunk flexed  Gait velocity: decreased  General Gait Details: Slow cadence with a definite step to/ start-stop pattern   ADL:  ADL  Overall ADL's : Needs assistance/impaired  Eating/Feeding: Maximal assistance  Eating/Feeding Details (indicate cue type and reason): family trying to feed pt. Educated family/friends on not trying to feed pt when she is so lethargic due to risk of aspiration. Friends verbalized understanding. Encouraged family to "assist" pt by having pt sit completely upright and have pt try to feed self and hold cup, etc.  Grooming: Maximal assistance;Sitting (supported)  Upper Body Bathing: Total assistance;Bed level  Lower Body Bathing: Total assistance;Bed level  Upper Body Dressing : Total assistance;Sitting  Lower Body Dressing: Total assistance;Bed level  Toilet Transfer: Minimal assistance;Stand-pivot;BSC  Toilet Transfer Details (indicate cue type and reason): increased time and asisst for balance  Toileting- Clothing  Manipulation and Hygiene: Minimal assistance;Sit to/from stand  Functional mobility during ADLs: Minimal assistance (stand pivot transfers)  General ADL Comments: Pt significantly improved. She is very motivated. Requires several rest breaks, but motivated to continue  Cognition:  Cognition  Overall Cognitive Status: Impaired/Different from baseline  Orientation Level: Oriented X4  Cognition  Arousal/Alertness: Awake/alert  Behavior During Therapy: Flat affect  Overall Cognitive Status: Impaired/Different from baseline  Area of Impairment: Memory;Problem solving  Orientation Level: Disoriented to;Place;Time;Situation  Current Attention Level: Focused  Memory: Decreased short-term memory  Following Commands: Follows one step commands consistently;Follows one step commands with increased time  Safety/Judgement: Decreased awareness of safety;Decreased awareness of deficits  Problem Solving: Slow processing  General Comments: greater than 10 second delay in processing. very lethargic. Family states pt did not sleep well last night. Pt family actually shared that pt has trouble sleeping at night at home and sleeps during day even when home.    Physical Exam:  Blood pressure 137/77, pulse 63, temperature 97.9 F (36.6 C), temperature source Oral, resp. rate 18, height 5' 6.54" (1.69 m), weight 108.5 kg (239 lb 3.2 oz), SpO2 100.00%.   Constitutional: She is oriented to  person, place, and time. She appears well-developed and well-nourished. Multiple scabbed herpetic lesions on lips.  HENT: oral mucosa pink and moist Head: Normocephalic and atraumatic.  Eyes: Conjunctivae are normal. Pupils are equal, round, and reactive to light.  Neck: Normal range of motion. Neck supple.  Cardiovascular: Normal rate and regular rhythm.  Respiratory: Effort normal and breath sounds normal. No respiratory distress. She has no wheezes.  GI: Soft. Bowel sounds are normal. She exhibits no distension. There is  no tenderness.  Musculoskeletal: She exhibits no edema and no tenderness.  Neurological: She is alert and oriented to person, place, and time.  Speech more spontaneous today. Improved insight and awareness. motor strength is 4/5 bilateral deltoid, biceps, triceps, grip. 3 minus bilateral hip flexors, 3 knee extensors ankle dorsiflexors and plantar flexors  Sensation intact to light touch bilateral upper and lower limbs  Deep tendon reflexes 3+ bilateral biceps, triceps, brachioradialis, patellar, 1+ at the ankles  Skin: Skin is warm and dry.  Results for orders placed during the hospital encounter of 01/18/14 (from the past 48 hour(s))   CBC Status: Abnormal    Collection Time    01/28/14 11:18 AM   Result  Value  Ref Range    WBC  7.1  4.0 - 10.5 K/uL    RBC  4.53  3.87 - 5.11 MIL/uL    Hemoglobin  11.8 (*)  12.0 - 15.0 g/dL    HCT  37.2  36.0 - 46.0 %    MCV  82.1  78.0 - 100.0 fL    MCH  26.0  26.0 - 34.0 pg    MCHC  31.7  30.0 - 36.0 g/dL    RDW  13.2  11.5 - 15.5 %    Platelets  278  150 - 400 K/uL   BASIC METABOLIC PANEL Status: Abnormal    Collection Time    01/28/14 11:18 AM   Result  Value  Ref Range    Sodium  134 (*)  137 - 147 mEq/L    Potassium  4.4  3.7 - 5.3 mEq/L    Chloride  101  96 - 112 mEq/L    CO2  24  19 - 32 mEq/L    Glucose, Bld  104 (*)  70 - 99 mg/dL    BUN  18  6 - 23 mg/dL    Creatinine, Ser  0.62  0.50 - 1.10 mg/dL    Calcium  8.4  8.4 - 10.5 mg/dL    GFR calc non Af Amer  >90  >90 mL/min    GFR calc Af Amer  >90  >90 mL/min    Comment:  (NOTE)     The eGFR has been calculated using the CKD EPI equation.     This calculation has not been validated in all clinical situations.     eGFR's persistently <90 mL/min signify possible Chronic Kidney     Disease.    Anion gap  9  5 - 15    No results found.  Medical Problem List and Plan:  1. Functional deficits secondary to Bacterial meningitis with decreased balance and LE strength.  2. DVT  Prophylaxis/Anticoagulation: Pharmaceutical: Lovenox  3. Pain Management: Tylenol prn pain.  4. Mood: Will continue Lexapro. LCSW to follow for evaluation and support.  5. Neuropsych: This patient is capable of making decisions on her own behalf.  6. Skin/Wound Care: Routine pressure measures. Maintain adequate nutrition and hydration status.  7. Fluids/Electrolytes/Nutrition: Monitor I/O. Continue  ensure supplements bid. Check lytes in am.  8. Sepsis due to bacterial meningitis: IV ceftriaxone to continue thorough 01/31/14  9. HSV stomatitis: Continue Valtrex D # 6 of 7  10. HTN: Monitor BP every 8 hours. Continue lisinopril and coreg.  11. Elevated troponin: Negatie Myoview study Aug 2014. Follow up study past discharge.  12. Hyponatermia: Will recheck lytes in am.  Post Admission Physician Evaluation:  1. Functional deficits secondary to Bacterial meningitis.  2. Patient is admitted to receive collaborative, interdisciplinary care between the physiatrist, rehab nursing staff, and therapy team. 3. Patient's level of medical complexity and substantial therapy needs in context of that medical necessity cannot be provided at a lesser intensity of care such as a SNF. 4. Patient has experienced substantial functional loss from his/her baseline which was documented above under the "Functional History" and "Functional Status" headings. Judging by the patient's diagnosis, physical exam, and functional history, the patient has potential for functional progress which will result in measurable gains while on inpatient rehab. These gains will be of substantial and practical use upon discharge in facilitating mobility and self-care at the household level. 5. Physiatrist will provide 24 hour management of medical needs as well as oversight of the therapy plan/treatment and provide guidance as appropriate regarding the interaction of the two. 6. 24 hour rehab nursing will assist with bladder management, bowel  management, safety, skin/wound care, disease management, medication administration, pain management and patient education and help integrate therapy concepts, techniques,education, etc. 7. PT will assess and treat for/with: Lower extremity strength, range of motion, stamina, balance, functional mobility, safety, adaptive techniques and equipment, NMR, cognitive perceptual awareness, family education, ego support, leisure awareness. Goals are: mod I to supervision . 8. OT will assess and treat for/with: ADL's, functional mobility, safety, upper extremity strength, adaptive techniques and equipment, NMR, cognitive perceptual awareness, community reintegration. Goals are: mod I to supervision. Therapy may proceed with showering this patient. 9. SLP will assess and treat for/with: cognition, communication. Goals are: mod I to supervision. 10. Case Management and Social Worker will assess and treat for psychological issues and discharge planning. 11. Team conference will be held weekly to assess progress toward goals and to determine barriers to discharge. 12. Patient will receive at least 3 hours of therapy per day at least 5 days per week. 13. ELOS: 10-13 days  14. Prognosis: excellent  Meredith Staggers, MD, [facility] 01/29/2014   01/29/2014

## 2014-01-29 NOTE — Progress Notes (Signed)
Seen and examined.  Agree with the documentation and management of Dr. Caroleen Hamman.  Patient is stable and ready for CIR when insurance approved and bed available.  Can complete antibiotic course at CIR.

## 2014-01-30 ENCOUNTER — Inpatient Hospital Stay (HOSPITAL_COMMUNITY): Payer: Federal, State, Local not specified - PPO

## 2014-01-30 ENCOUNTER — Inpatient Hospital Stay (HOSPITAL_COMMUNITY): Payer: Federal, State, Local not specified - PPO | Admitting: Speech Pathology

## 2014-01-30 ENCOUNTER — Inpatient Hospital Stay (HOSPITAL_COMMUNITY): Payer: Federal, State, Local not specified - PPO | Admitting: Occupational Therapy

## 2014-01-30 DIAGNOSIS — D649 Anemia, unspecified: Secondary | ICD-10-CM

## 2014-01-30 DIAGNOSIS — I10 Essential (primary) hypertension: Secondary | ICD-10-CM

## 2014-01-30 DIAGNOSIS — G009 Bacterial meningitis, unspecified: Secondary | ICD-10-CM

## 2014-01-30 LAB — COMPREHENSIVE METABOLIC PANEL
ALT: 33 U/L (ref 0–35)
AST: 22 U/L (ref 0–37)
Albumin: 2.6 g/dL — ABNORMAL LOW (ref 3.5–5.2)
Alkaline Phosphatase: 77 U/L (ref 39–117)
Anion gap: 12 (ref 5–15)
BUN: 16 mg/dL (ref 6–23)
CALCIUM: 9 mg/dL (ref 8.4–10.5)
CO2: 24 meq/L (ref 19–32)
CREATININE: 0.72 mg/dL (ref 0.50–1.10)
Chloride: 101 mEq/L (ref 96–112)
GLUCOSE: 123 mg/dL — AB (ref 70–99)
Potassium: 4.1 mEq/L (ref 3.7–5.3)
SODIUM: 137 meq/L (ref 137–147)
Total Bilirubin: 0.4 mg/dL (ref 0.3–1.2)
Total Protein: 6.7 g/dL (ref 6.0–8.3)

## 2014-01-30 LAB — CBC WITH DIFFERENTIAL/PLATELET
Basophils Absolute: 0 10*3/uL (ref 0.0–0.1)
Basophils Relative: 0 % (ref 0–1)
Eosinophils Absolute: 0 10*3/uL (ref 0.0–0.7)
Eosinophils Relative: 1 % (ref 0–5)
HEMATOCRIT: 37 % (ref 36.0–46.0)
HEMOGLOBIN: 11.6 g/dL — AB (ref 12.0–15.0)
LYMPHS ABS: 1.4 10*3/uL (ref 0.7–4.0)
LYMPHS PCT: 26 % (ref 12–46)
MCH: 25.9 pg — ABNORMAL LOW (ref 26.0–34.0)
MCHC: 31.4 g/dL (ref 30.0–36.0)
MCV: 82.6 fL (ref 78.0–100.0)
MONOS PCT: 10 % (ref 3–12)
Monocytes Absolute: 0.6 10*3/uL (ref 0.1–1.0)
Neutro Abs: 3.6 10*3/uL (ref 1.7–7.7)
Neutrophils Relative %: 63 % (ref 43–77)
Platelets: 306 10*3/uL (ref 150–400)
RBC: 4.48 MIL/uL (ref 3.87–5.11)
RDW: 13.3 % (ref 11.5–15.5)
WBC: 5.6 10*3/uL (ref 4.0–10.5)

## 2014-01-30 MED ORDER — SORBITOL 70 % SOLN
30.0000 mL | Status: AC
  Administered 2014-01-30: 30 mL via ORAL
  Filled 2014-01-30: qty 30

## 2014-01-30 NOTE — Progress Notes (Signed)
Physical Medicine and Rehabilitation Consult   Reason for Consult: Bacterial meningitis with mental status changes, RUE weakness, double vision and difficulty walking Referring Physician: Dr. Lum Babe     HPI: [name] [name] is a 63 y.o. female with history of HTN, depression, anxiety disorder who was admitted on 01/18/14 with lethargy, fever 101.3 with leucocytosis and elevated troponin levels. She was started on IV antibiotics for SIRS and blood cultures done positive for strep Pneumo.  LP with elevated protein >600 and WBC 11321. She was started on steroids as well as Vanc, Ceftriaxone and ampicillin for bacterial meningitis.  CT head with slight asymmetry of cerebellar peduncles and Dr. Roseanne Reno recommended MRI brain for work up.  MRI brain with diffusion abnormality around atrial of lateral ventricles and anterior  to both frontal horns suggestive of meningitis and ventriculitis. ID consulted for input and recommends 14 days of IV ceftriaxone  for treatment of Meningitis, ventriculitis  and pneumococcal bacteremia. 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  Cardiology feels that patient with demand ischemia rather than NSTEMI. Patient with normal Myoview in August and 2 D echo with EF 55-60% with no wall abnormality and recommends outpatient Myoview as patient asymptomatic.    Patient is up in a chair but tired and dosing off. Sisters are in the room.   Review of Systems  HENT: Negative for hearing loss.   Eyes: Positive for blurred vision and double vision. Negative for photophobia.  Respiratory: Negative for cough and shortness of breath.   Cardiovascular: Negative for chest pain and palpitations.  Gastrointestinal: Negative for heartburn, nausea and abdominal pain.  Musculoskeletal: Positive for myalgias.  Neurological: Negative for dizziness and headaches.  Psychiatric/Behavioral: Positive for memory loss. The patient is not nervous/anxious.        Past Medical History    Diagnosis  Date   .  Cataract     .  Depression     .  Anxiety     .  HTN (hypertension)     .  Hyperlipemia      Past Surgical History   Procedure  Laterality  Date   .  Cyst removal bilateral wrists        Family History   Problem  Relation  Age of Onset   .  Heart attack  Mother  30   .  Stroke  Mother     .  Hypertension  Mother     .  Heart attack  Father  48    Social History:  Lives alone. Has two sisters in town. Retired--used to work for [employer]. She reports that she quit smoking about 2 years ago. Her smoking use included Cigarettes. She has a 20 pack-year smoking history. She does not have any smokeless tobacco history on file. She reports that she does not drink alcohol or use illicit drugs.     Allergies: No Known Allergies Medications Prior to Admission   Medication  Sig  Dispense  Refill   .  aspirin 81 MG chewable tablet  Chew 81 mg by mouth daily.         .  diphenhydramine-acetaminophen (TYLENOL PM) 25-500 MG TABS  Take 2 tablets by mouth at bedtime as needed (for sleep).         Marland Kitchen  escitalopram (LEXAPRO) 10 MG tablet  Take 10 mg by mouth daily.         Marland Kitchen  lisinopril (PRINIVIL,ZESTRIL) 20 MG tablet  Take 20 mg by mouth daily.         Marland Kitchen  Multiple Vitamins-Minerals (MULTIVITAMIN PO)  Take 1 tablet by mouth daily.         .  simvastatin (ZOCOR) 20 MG tablet  Take 20 mg by mouth daily.            Home: Home Living Family/patient expects to be discharged to:: Inpatient rehab Living Arrangements: Alone Available Help at Discharge: Family;Available PRN/intermittently Type of Home: House Home Access: Stairs to enter Entrance Stairs-Number of Steps: 4 Entrance Stairs-Rails: None Home Layout: Multi-level Alternate Level Stairs-Number of Steps: 8 Home Equipment: None   Functional History: Prior Function Level of Independence: Independent Functional Status:   Mobility: Bed Mobility Overal bed mobility: Needs Assistance;+ 2 for safety/equipment Bed Mobility:  Supine to Sit Supine to sit: Min assist;+2 for safety/equipment Sit to supine: Total assist;+2 for physical assistance General bed mobility comments: Pt needed assist to move LEs and for elevation of trunk.  Delayed in following commands.  Pt initiating sit - sdielyingOverall delay in initiation of movement needing incr cues and time to perform activities.  Also fatigued quickly.  Transfers Overall transfer level: Needs assistance Equipment used:  (Stedy used) Transfers: Sit to/from [employer] to Stand: Mod assist;+2 physical assistance;From elevated surface Stand pivot transfers:  (used Stedy) General transfer comment: Pt able to stand with mod assist initially and use and use Stedy to stand but had difficulty standing fully upright until tactilely cues as well as verbally cued.  Pt stood for 35 seconds to put the flaps under her bottom.  Then she rested for 2 minutes and stood again but only for 10 seconds.  On last attempt at stand, moved flaps and pt stood 35 seconds and then sat back onto chair.  Pt fully fatigued at this point and was falling asleep as well as needed max verbal cues to scoot back in chair.  Pt continues with  delayed initiation of all movements to include standing, moving LEs, etc.     ADL: ADL Overall ADL's : Needs assistance/impaired Eating/Feeding: Maximal assistance Eating/Feeding Details (indicate cue type and reason): family trying to feed pt. Educated family/friends on not trying to feed pt when she is so lethargic due to risk of aspiration. Friends verbalized understanding. Encouraged family to "assist" pt by having pt sit completely upright and have pt try to feed self and hold cup, etc. Grooming: Maximal assistance;Sitting (supported) Upper Body Bathing: Total assistance;Bed level Lower Body Bathing: Total assistance;Bed level Upper Body Dressing : Total assistance;Sitting Lower Body Dressing: Total assistance;Bed level Toilet Transfer: +2 for physical  assistance;+2 for safety/equipment;Stand-pivot;Total assistance Toilet Transfer Details (indicate cue type and reason): use of Stedy Toileting- Clothing Manipulation and Hygiene: Total assistance;+2 for physical assistance Functional mobility during ADLs: Total assistance;+2 for physical assistance (with use of Stedy) General ADL Comments: Per PT, pt participating at beginning of session. Pt easily fatigued and became less able to participate.   Cognition: Cognition Overall Cognitive Status: Impaired/Different from baseline Orientation Level: Oriented to person;Oriented to place;Oriented to time;Disoriented to situation Cognition Arousal/Alertness: Lethargic Behavior During Therapy: Flat affect Overall Cognitive Status: Impaired/Different from baseline Area of Impairment: Orientation;Attention;Memory;Following commands;Awareness;Safety/judgement;Problem solving Orientation Level: Disoriented to;Place;Time;Situation Current Attention Level: Focused Memory: Decreased short-term memory Following Commands: Follows one step commands inconsistently;Follows one step commands with increased time Safety/Judgement: Decreased awareness of safety;Decreased awareness of deficits Problem Solving: Slow processing;Decreased initiation;Difficulty sequencing;Requires verbal cues;Requires tactile cues General Comments: greater than 10 second delay in processing. very lethargic. Family states pt did not sleep well last night.  Pt family actually  shared that pt has trouble sleeping at night at home and sleeps during day even when home.     Blood pressure 141/69, pulse 67, temperature 97.5 F (36.4 C), temperature source Oral, resp. rate 18, height 5' 6.54" (1.69 m), weight 113.7 kg (250 lb 10.6 oz), SpO2 97.00%. Physical Exam  Nursing note and vitals reviewed. Constitutional: She is oriented to person, place, and time. She appears well-developed and well-nourished.  HENT:   Head: Normocephalic and  atraumatic.  Eyes: Conjunctivae are normal. Pupils are equal, round, and reactive to light.  Neck: Normal range of motion. Neck supple.  Cardiovascular: Normal rate and regular rhythm.   Respiratory: Effort normal and breath sounds normal. No respiratory distress. She has no wheezes.  GI: Soft. Bowel sounds are normal. She exhibits no distension. There is no tenderness.  Musculoskeletal: She exhibits no edema and no tenderness.  Neurological: She is alert and oriented to person, place, and time.  Slow measured speech. Able to follow one and two step commands.  Inconsistent answers. Question sensory deficits BLE.   Skin: Skin is warm and dry.  motor strength is 4/5 bilateral deltoid, biceps, triceps, grip 3 minus bilateral hip flexors knee extensors ankle dorsiflexors and plantar flexors Sensation intact to light touch bilateral upper and lower limbs Due to reflexes 3+ bilateral biceps, triceps, brachioradialis, patellar, 1+ at the ankles    Results for orders placed during the hospital encounter of 01/18/14 (from the past 24 hour(s))   BASIC METABOLIC PANEL     Status: Abnormal     Collection Time      01/24/14  4:25 AM       Result  Value  Ref Range     Sodium  141   137 - 147 mEq/L     Potassium  5.1   3.7 - 5.3 mEq/L     Chloride  106   96 - 112 mEq/L     CO2  21   19 - 32 mEq/L     Glucose, Bld  99   70 - 99 mg/dL     BUN  28 (*)  6 - 23 mg/dL     Creatinine, Ser  1.610.66   0.50 - 1.10 mg/dL     Calcium  8.6   8.4 - 10.5 mg/dL     GFR calc non Af Amer  >90   >90 mL/min     GFR calc Af Amer  >90   >90 mL/min     Anion gap  14   5 - 15   CBC     Status: Abnormal     Collection Time      01/24/14  4:25 AM       Result  Value  Ref Range     WBC  13.9 (*)  4.0 - 10.5 K/uL     RBC  5.47 (*)  3.87 - 5.11 MIL/uL     Hemoglobin  13.9   12.0 - 15.0 g/dL     HCT  09.643.6   04.536.0 - 46.0 %     MCV  79.7   78.0 - 100.0 fL     MCH  25.4 (*)  26.0 - 34.0 pg     MCHC  31.9   30.0 - 36.0 g/dL      RDW  40.913.1   81.111.5 - 15.5 %     Platelets  296   150 - 400 K/uL    No results found.  Assessment/Plan: Diagnosis: Bacterial meningitis with decreased balance and lower shimmy strength Does the need for close, 24 hr/day medical supervision in concert with the patient's rehab needs make it unreasonable for this patient to be served in a less intensive setting? Yes Co-Morbidities requiring supervision/potential complications: Hypertension, demand ischemia, anemia Due to bladder management, bowel management, safety, skin/wound care, disease management, medication administration, pain management and patient education, does the patient require 24 hr/day rehab nursing? Yes Does the patient require coordinated care of a physician, rehab nurse, PT (1-2 hrs/day, 5 days/week), OT (1-2 hrs/day, 5 days/week) and SLP (0.5-1 hrs/day, 5 days/week) to address physical and functional deficits in the context of the above medical diagnosis(es)? Yes Addressing deficits in the following areas: balance, endurance, locomotion, strength, transferring, bathing, dressing, feeding, grooming, toileting and cognition Can the patient actively participate in an intensive therapy program of at least 3 hrs of therapy per day at least 5 days per week? Potentially and May need to 15/7 schedule at first The potential for patient to make measurable gains while on inpatient rehab is good Anticipated functional outcomes upon discharge from inpatient rehab are supervision with PT, supervision with OT, supervision with SLP. Estimated rehab length of stay to reach the above functional goals is: 14-18 days Does the patient have adequate social supports to accommodate these discharge functional goals? Potentially Anticipated D/C setting: Home Anticipated post D/C treatments: HH therapy Overall Rehab/Functional Prognosis: good   RECOMMENDATIONS: This patient's condition is appropriate for continued rehabilitative care in the following  setting: CIR Patient has agreed to participate in recommended program. Yes Note that insurance prior authorization may be required for reimbursement for recommended care.   Comment:        01/24/2014    Revision History...     Date/Time User Action   01/24/2014 3:46 PM Erick Colace, MD Sign   01/24/2014 2:32 PM Jacquelynn Cree, PA-C Share  View Details Report   Routing History...     Date/Time From To Method   01/24/2014 3:46 PM Erick Colace, MD Erick Colace, MD In Basket   01/24/2014 3:46 PM Erick Colace, MD Tonye Pearson, MD In [facility]

## 2014-01-30 NOTE — Progress Notes (Signed)
[facility] PHYSICAL MEDICINE & REHABILITATION     PROGRESS NOTE    Subjective/Complaints: No new issues overnight. Appears to have slept well. No bm  Objective: Vital Signs: Blood pressure 118/59, pulse 76, temperature 98.6 F (37 C), temperature source Oral, resp. rate 18, height 5\' 6"  (1.676 m), weight 105.87 kg (233 lb 6.4 oz), SpO2 97.00%. No results found.  Recent Labs  01/28/14 1118  WBC 7.1  HGB 11.8*  HCT 37.2  PLT 278    Recent Labs  01/28/14 1118  NA 134*  K 4.4  CL 101  GLUCOSE 104*  BUN 18  CREATININE 0.62  CALCIUM 8.4   CBG (last 3)  No results found for this basename: GLUCAP,  in the last 72 hours  Wt Readings from Last 3 Encounters:  01/29/14 105.87 kg (233 lb 6.4 oz)  01/27/14 108.5 kg (239 lb 3.2 oz)  10/17/13 107.956 kg (238 lb)    Physical Exam:   Constitutional: She is oriented to person, place, and time. She appears well-developed and well-nourished. Multiple scabbed herpetic lesions on lips.  HENT: oral mucosa pink and moist  Head: Normocephalic and atraumatic.  Eyes: Conjunctivae are normal. Pupils are equal, round, and reactive to light.  Neck: Normal range of motion. Neck supple.  Cardiovascular: Normal rate and regular rhythm.  Respiratory: Effort normal and breath sounds normal. No respiratory distress. She has no wheezes.  GI: Soft. Bowel sounds are normal. She exhibits no distension. There is no tenderness.  Musculoskeletal: She exhibits no edema and no tenderness.  Neurological: She is alert and oriented to person, place, and time.  Speech normal.  Improved insight and awareness.  motor strength is 4/5 bilateral deltoid, biceps, triceps, grip. 3 minus bilateral hip flexors, 3 knee extensors ankle dorsiflexors and plantar flexors  Sensation intact to light touch bilateral upper and lower limbs  Deep tendon reflexes 3+ bilateral biceps, triceps, brachioradialis, patellar, 1+ at the ankles  Skin: Skin is warm and dry.    Assessment/Plan: 1. Functional deficits secondary to bacterial meningitis which require 3+ hours per day of interdisciplinary therapy in a comprehensive inpatient rehab setting. Physiatrist is providing close team supervision and 24 hour management of active medical problems listed below. Physiatrist and rehab team continue to assess barriers to discharge/monitor patient progress toward functional and medical goals. FIM:                   Comprehension Comprehension Mode: Auditory Comprehension: 5-Understands complex 90% of the time/Cues < 10% of the time  Expression Expression Mode: Verbal Expression: 5-Expresses basic needs/ideas: With no assist  Social Interaction Social Interaction: 6-Interacts appropriately with others with medication or extra time (anti-anxiety, antidepressant).        Medical Problem List and Plan:  1. Functional deficits secondary to Bacterial meningitis with decreased balance and LE strength.  2. DVT Prophylaxis/Anticoagulation: Pharmaceutical: Lovenox  3. Pain Management: Tylenol prn pain.  4. Mood: Will continue Lexapro. LCSW to follow for evaluation and support.  5. Neuropsych: This patient is capable of making decisions on her own behalf.  6. Skin/Wound Care: Routine pressure measures. Maintain adequate nutrition and hydration status.  7. Fluids/Electrolytes/Nutrition: Monitor I/O. Continue ensure supplements bid. Check lytes today.  8. Sepsis due to bacterial meningitis: IV ceftriaxone to continue thorough 01/31/14  9. HSV stomatitis: Continue Valtrex D # 7 of 7  10. HTN: Monitor BP every 8 hours. Continue lisinopril and coreg.  11. Elevated troponin: Negatie Myoview study Aug 2014. Follow up  study past discharge.  12. Hyponatermia: Will recheck lytes today 13. Constipation: rx   LOS (Days) 1 A FACE TO FACE EVALUATION WAS PERFORMED  SWARTZ,ZACHARY T 01/30/2014 8:10 AM

## 2014-01-30 NOTE — Discharge Summary (Signed)
Seen and examined on the day of DC.  Agree with the discharge and with the management and documentation of Dr. Caroleen Hamman.

## 2014-01-30 NOTE — Evaluation (Signed)
Occupational Therapy Assessment and Plan  Patient Details  Name: [name] [name] MRN: [id number] Date of Birth: [birth year]  OT Diagnosis: cognitive deficits and muscle weakness (generalized) Rehab Potential: Rehab Potential: Excellent ELOS: 7-10 days   Today's Date: 01/30/2014 OT Individual Time: 1100-1200 OT Individual Time Calculation (min): 60 min     Problem List:  Patient Active Problem List   Diagnosis Date Noted  . Meningitis due to bacteria 01/29/2014  . Demand myocardial ischemic related infarction 01/23/2014    Class: Diagnosis of  . Sepsis 01/18/2014  . Altered mental status 01/18/2014  . Depression, reactive 10/12/2012  . Chronic neutropenia 04/14/2012  . Hyperlipemia 10/20/2011  . Anemia 10/20/2011  . HTN (hypertension) 10/20/2011  . BMI 32.0-32.9,adult 10/20/2011    Past Medical History:  Past Medical History  Diagnosis Date  . Cataract   . Depression   . Anxiety   . HTN (hypertension)   . Hyperlipemia    Past Surgical History:  Past Surgical History  Procedure Laterality Date  . Cyst removal bilateral wrists      Assessment & Plan Clinical Impression: [name] [name] is a 63 y.o. female with history of HTN, depression, anxiety disorder who was admitted on 01/18/14 with lethargy, fever 101.3 with leucocytosis and elevated troponin levels. She was started on IV antibiotics for SIRS and blood cultures done positive for strep Pneumo. LP with elevated protein >600 and WBC 11321. She was started on steroids as well as Vanc, Ceftriaxone and ampicillin for bacterial meningitis. CT head with slight asymmetry of cerebellar peduncles and Dr. Nicole Kindred recommended MRI brain for work up. MRI brain with diffusion abnormality around atrial of lateral ventricles and anterior to both frontal horns suggestive of meningitis and ventriculitis. ID consulted for input and recommends 14 days of IV ceftriaxone for treatment of Meningitis, ventriculitis and pneumococcal bacteremia.  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 Cardiology feels that patient with demand ischemia rather than NSTEMI. Patient with normal Myoview in August and 2 D echo with EF 55-60% with no wall abnormality and recommends outpatient Myoview as patient asymptomatic. Follow up MRI brain done 09/28 with slight improvement in scattered restriction foci and no acute infarct or abscess. Patient transferred to CIR on 01/29/2014 .    Patient currently requires min with basic self-care skills secondary to muscle weakness, decreased safety awareness and decreased standing balance.  Prior to hospitalization, patient could complete ADLs and IADLs with independent .  Patient will benefit from skilled intervention to increase independence with basic self-care skills and increase level of independence with iADL prior to discharge home independently.  Anticipate patient will require intermittent supervision and no further OT follow recommended.  OT - End of Session Activity Tolerance: Tolerates 30+ min activity with multiple rests Endurance Deficit: Yes OT Assessment Rehab Potential: Excellent OT Patient demonstrates impairments in the following area(s): Balance;Cognition;Endurance;Safety;Skin Integrity OT Basic ADL's Functional Problem(s): Grooming;Bathing;Dressing;Toileting OT Advanced ADL's Functional Problem(s): Simple Meal Preparation;Laundry OT Transfers Functional Problem(s): Toilet;Tub/Shower OT Plan OT Intensity: Minimum of 1-2 x/day, 45 to 90 minutes OT Frequency: 5 out of 7 days OT Duration/Estimated Length of Stay: 7-10 days OT Treatment/Interventions: [employer] reintegration;Discharge planning;Cognitive remediation/compensation;Psychosocial support;Functional mobility training;DME/adaptive equipment instruction;Patient/family education;Self Care/advanced ADL retraining;Therapeutic Activities OT Basic Self-Care Anticipated Outcome(s): Mod I OT Toileting Anticipated Outcome(s): Mod I OT Bathroom  Transfers Anticipated Outcome(s): Mod I OT Recommendation Patient destination: Home Follow Up Recommendations: None Equipment Recommended: To be determined   Skilled Therapeutic Intervention Pt supine in bed with 2 sisters in  room when arriving. Pt reported 0/10 pain. Pt reported poor hearing, particularly in L ear, and that she had "foggy" vision this morning that resolved w/ time. Pt t/f from supine to sit and then reported light-headedness, particularly when looking up, RN notified. Pt t/f from sit to stand and ambulated to sink and performed hand washing using RW. Pt ambulated and t/f to toilet using walker and grab bar. Pt voided in toilet and completed hygiene and stood w/ RW. Pt ambulated to bed and sat to complete UB/LB dressing. Pt ambulated to sink and completed grooming tasks of washing hands and washing face while standing w./ RW. Pt sat in w/c and brushed hair and brushed teeth. Pt walked slowly w/ RW throughout session and demonstrated decreased dynamic standing balance. Pt educated on safe use of RW throughout session, including hand placement when sitting and standing. Pt seated in w/c w/ all needs nearby when leaving.  OT Evaluation General Chart Reviewed: Yes Family/Caregiver Present: Yes  Home Living/Prior Functioning Home Living Living Arrangements: Alone Available Help at Discharge: Family (sister can help 24 hours/day initially, intermittent after that) Type of Home: House (Townhouse) Home Access: Stairs to enter [employer] of Steps: 1 step from the garage into the kitchen Entrance Stairs-Rails: None Home Layout: Two level;Bed/bath upstairs;1/2 bath on main level Alternate Level Stairs-Number of Steps: 5 steps to landing and then 8 steps Alternate Level Stairs-Rails: Right;Left Additional Comments: 1/2 bath downstairs  Lives With: Alone IADL History Homemaking Responsibilities: Yes Meal Prep Responsibility: Primary (Makes breakfast, eats out regularly  for other meals) Laundry Responsibility: Primary Cleaning Responsibility: Primary Bill Paying/Finance Responsibility: Primary Shopping Responsibility: Primary Child Care Responsibility: No Current License: Yes Mode of Transportation: Car Education: 1 year college Occupation: Retired Type of Occupation: [employer] Leisure and Hobbies: cross word puzzles, reading, watching old movies  Prior Function Level of Independence: Independent with basic ADLs;Independent with homemaking with ambulation  Able to Take Stairs?: Yes Driving: Yes Vocation: Retired Leisure: Hobbies-yes (Comment)  ADL See FIM  Vision/Perception  Vision- History Baseline Vision/History: Wears glasses Wears Glasses: Reading only Patient Visual Report: Other (comment);Diplopia ("foggy" in the morning but then it went away, double vision went away) Vision- Assessment Vision Assessment?: No apparent visual deficits   Cognition Overall Cognitive Status: Impaired/Different from baseline Arousal/Alertness: Awake/alert Orientation Level: Oriented X4 Attention: Alternating Sustained Attention: Appears intact Alternating Attention: Appears intact Memory: Impaired Memory Impairment: Decreased recall of new information;Decreased short term memory;Storage deficit;Retrieval deficit Decreased Short Term Memory: Verbal basic;Functional basic Awareness: Impaired Awareness Impairment: Emergent impairment Problem Solving: Appears intact Safety/Judgment: Appears intact  Sensation Sensation Light Touch: Not tested Stereognosis: Not tested Hot/Cold: Not tested Proprioception: Not tested Coordination Gross Motor Movements are Fluid and Coordinated: Not tested Fine Motor Movements are Fluid and Coordinated: Not tested  Motor  Motor Motor: Within Functional Limits  Mobility  Bed Mobility Bed Mobility: Rolling Right;Supine to Sit Rolling Right: 5: Supervision Sit to Supine: 4: Min guard Transfers Transfers: Sit to  Stand;Stand to Sit Sit to Stand: 4: Min assist Sit to Stand Details: Verbal cues for safe use of DME/AE Stand to Sit: 4: Min guard;To bed;To toilet   Trunk/Postural Assessment  See PT evaluation note.  Balance See PT evaluation note  Extremity/Trunk Assessment RUE Assessment RUE Assessment: Within Functional Limits LUE Assessment LUE Assessment: Within Functional Limits  FIM:  FIM - Eating Eating Activity: 6: Swallowing techniques: self-managed FIM - Grooming Grooming Steps: Wash, rinse, dry face;Wash, rinse, dry hands;Oral care, brush teeth, clean dentures;Brush,  comb hair Grooming: 5: Set-up assist to obtain items FIM - Upper Body Dressing/Undressing Upper body dressing/undressing steps patient completed: Thread/unthread right bra strap;Thread/unthread left bra strap;Hook/unhook bra;Thread/unthread right sleeve of pullover shirt/dresss;Thread/unthread left sleeve of pullover shirt/dress;Put head through opening of pull over shirt/dress;Pull shirt over trunk Upper body dressing/undressing: 5: Set-up assist to: Obtain clothing/put away FIM - Lower Body Dressing/Undressing Lower body dressing/undressing steps patient completed: Thread/unthread right pants leg;Thread/unthread left pants leg;Pull pants up/down;Don/Doff right sock;Don/Doff left sock Lower body dressing/undressing: 5: Set-up assist to: Obtain clothing FIM - Toileting Toileting steps completed by patient: Adjust clothing prior to toileting;Performs perineal hygiene;Adjust clothing after toileting Toileting Assistive Devices: Grab bar or rail for support Toileting: 4: Steadying assist FIM - Control and instrumentation engineer Devices: Walker;Bed rails Bed/Chair Transfer: 4: Supine > Sit: Min A (steadying Pt. > 75%/lift 1 leg);4: Bed > Chair or W/C: Min A (steadying Pt. > 75%) FIM - Radio producer Devices: Mining engineer Transfers: 4-To toilet/BSC: Min A (steadying Pt. >  75%);4-From toilet/BSC: Min A (steadying Pt. > 75%)   Refer to Care Plan for Long Term Goals  Recommendations for other services: Neuropsych  Discharge Criteria: Patient will be discharged from OT if patient refuses treatment 3 consecutive times without medical reason, if treatment goals not met, if there is a change in medical status, if patient makes no progress towards goals or if patient is discharged from hospital.  The above assessment, treatment plan, treatment alternatives and goals were discussed and mutually agreed upon: by patient  Nahomi Hegner [name] 01/30/2014, 1:16 PM

## 2014-01-30 NOTE — Evaluation (Signed)
Evaluation note and session note reviewed and both accurately reflect assessment & plan and treatment session.    

## 2014-01-30 NOTE — Progress Notes (Signed)
PMR Admission Coordinator Pre-Admission Assessment  Patient: [name] [name] is an 63 y.o., female  MRN: [id number]  DOB: 24-May-1950  Height: 5' 6.53" (169 cm)  Weight: 108.5 kg (239 lb 3.2 oz)  Insurance Information  HMO: PPO: PCP: IPA: 80/20: OTHER: Fee for service/standard option  PRIMARY: Sharen CounterFederal BCBS Policy#: [id number] Subscriber: pt  CM Name: Maryruth Hancocknn Marie Phone#: [phone] Fax#: [phone]  Pre-Cert#: [id number] Employer: retired IRS cert for 7 days  Benefits: Phone #: [phone] Name: 9/25  Eff. Date: 05/2006 Deduct: $350 Out of Pocket Max: $5000 Life Max: none  CIR: $250 copay per admission and then covers 100% SNF: no benefit  Outpatient: $20 copay per visit Co-Pay: 75 combined visits  Home Health: 85% Co-Pay: 50 visits combined  DME: 85% Co-Pay: 15%  Providers: in network   SECONDARY: none  Medicaid Application Date: Case Manager:  Disability Application Date: Case Worker:  Emergency Games developerContact Information    Contact Information     Name  Relation  Home  Work  Mobile     [place] Arrowhead[place]erson,Gail  Sister  [phone]   [phone]     Roberson,Reginald  Relative    [phone]        Current Medical History  Patient Admitting Diagnosis:Bacterial Meningitis  History of Present Illness: [name] [name] is a 63 y.o. female with history of HTN, depression, anxiety disorder who was admitted on 01/18/14 with lethargy, fever 101.3 with leucocytosis and elevated troponin levels. She was started on IV antibiotics for SIRS and blood cultures done positive for strep Pneumo. LP with elevated protein >600 and WBC 11321. She was started on steroids as well as Vanc, Ceftriaxone and ampicillin for bacterial meningitis. CT head with slight asymmetry of cerebellar peduncles and Dr. Roseanne RenoStewart recommended MRI brain for work up. MRI brain with diffusion abnormality around atrial of lateral ventricles and anterior to both frontal horns suggestive of meningitis and ventriculitis. ID consulted for input and  recommends 14 days of IV ceftriaxone for treatment of Meningitis, ventriculitis and pneumococcal bacteremia. 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 Cardiology feels that patient with demand ischemia rather than NSTEMI. Patient with normal Myoview in August and 2 D echo with EF 55-60% with no wall abnormality and recommends outpatient Myoview as patient asymptomatic.  Antibioitic day 11 of 14 day course 9/28. Steroids stopped 9/23. MRI- Findings consistent with known meningitis. Scattered foci of diffusion restriction are stable to slightly decreased in conspicuity. No evidence of acute infarct or brain abscess. On 01/27/14.  Past Medical History    Past Medical History    Diagnosis  Date    .  Cataract     .  Depression     .  Anxiety     .  HTN (hypertension)     .  Hyperlipemia      Family History  family history includes Heart attack (age of onset: 5558) in her father; Heart attack (age of onset: 6970) in her mother; Hypertension in her mother; Stroke in her mother.  Prior Rehab/Hospitalizations: none  Current Medications  Current facility-administered medications:0.9 % sodium chloride infusion, , Intravenous, Continuous, Myra RudeJeremy E Schmitz, MD, Last Rate: 10 mL/hr at 01/23/14 1209, 10 mL/hr at 01/23/14 1209; acetaminophen (TYLENOL) suppository 650 mg, 650 mg, Rectal, Q6H PRN, Myra RudeJeremy E Schmitz, MD; acetaminophen (TYLENOL) tablet 650 mg, 650 mg, Oral, Q6H PRN, Myra RudeJeremy E Schmitz, MD, 650 mg at 01/26/14 1514  acyclovir ointment (ZOVIRAX) 5 %, , Topical, Q3H, Jinger NeighborsMary A Lynch, NP; antiseptic oral rinse (CPC /  CETYLPYRIDINIUM CHLORIDE 0.05%) solution 7 mL, 7 mL, Mouth Rinse, BID, Myra Rude, MD, 7 mL at 01/29/14 1000; aspirin chewable tablet 81 mg, 81 mg, Oral, Daily, Jacquiline Doe, MD, 81 mg at 01/29/14 1044; carbamide peroxide (DEBROX) 6.5 % otic solution 5 drop, 5 drop, Left Ear, BID, [place] N Rumley, DO  carvedilol (COREG) tablet 3.125 mg, 3.125 mg, Oral, BID WC, Marykay Lex, MD, 3.125 mg at  01/29/14 0240; cefTRIAXone (ROCEPHIN) 2 g in dextrose 5 % 50 mL IVPB, 2 g, Intravenous, Q12H, Jayce G Cook, DO, 2 g at 01/29/14 1045; escitalopram (LEXAPRO) tablet 10 mg, 10 mg, Oral, Daily, [place] N Rumley, DO, 10 mg at 01/29/14 1044  feeding supplement (ENSURE COMPLETE) (ENSURE COMPLETE) liquid 237 mL, 237 mL, Oral, BID BM, Ailene Ards, RD, 237 mL at 01/29/14 1000; heparin injection 5,000 Units, 5,000 Units, Subcutaneous, 3 times per day, Myra Rude, MD, 5,000 Units at 01/29/14 0631; hydrALAZINE (APRESOLINE) injection 5 mg, 5 mg, Intravenous, Q6H PRN, [place] N Rumley, DO  lisinopril (PRINIVIL,ZESTRIL) tablet 20 mg, 20 mg, Oral, Daily, [place] N Rumley, DO, 20 mg at 01/29/14 1044; LORazepam (ATIVAN) injection 1 mg, 1 mg, Intravenous, Q6H PRN, Tommie Sams, DO, 1 mg at 01/24/14 0025; magic mouthwash w/lidocaine, 2 mL, Oral, TID, [place] N Rumley, DO, 2 mL at 01/29/14 1000; morphine 2 MG/ML injection 1 mg, 1 mg, Intravenous, Q2H PRN, Tommie Sams, DO, 1 mg at 01/23/14 0016  senna (SENOKOT) tablet 8.6 mg, 1 tablet, Oral, Daily PRN, [place] N Rumley, DO; simvastatin (ZOCOR) tablet 20 mg, 20 mg, Oral, q1800, [place] N Rumley, DO, 20 mg at 01/28/14 1759; valACYclovir (VALTREX) tablet 1,000 mg, 1,000 mg, Oral, TID, Ginnie Smart, MD, 1,000 mg at 01/29/14 1045; vitamin A & D ointment, , Topical, PRN, Janit Pagan, MD  Patients Current Diet: Dysphagia 2 diet with thin liquids  Precautions / Restrictions  Precautions  Precautions: Fall  Restrictions  Weight Bearing Restrictions: No  Prior Activity Level  Community (5-7x/wk): independent and living alone pta; driving  Journalist, newspaper / Equipment  Home Assistive Devices/Equipment: None  Home Equipment: None  Prior Functional Level  Prior Function  Level of Independence: Independent  Current Functional Level    Cognition  Overall Cognitive Status: Impaired/Different from baseline  Current Attention Level: Focused  Orientation  Level: Oriented X4  Following Commands: Follows one step commands consistently;Follows one step commands with increased time  Safety/Judgement: Decreased awareness of safety;Decreased awareness of deficits  General Comments: greater than 10 second delay in processing. very lethargic. Family states pt did not sleep well last night. Pt family actually shared that pt has trouble sleeping at night at home and sleeps during day even when home.    Extremity Assessment  (includes Sensation/Coordination)      ADLs  Overall ADL's : Needs assistance/impaired  Eating/Feeding: Maximal assistance  Eating/Feeding Details (indicate cue type and reason): family trying to feed pt. Educated family/friends on not trying to feed pt when she is so lethargic due to risk of aspiration. Friends verbalized understanding. Encouraged family to "assist" pt by having pt sit completely upright and have pt try to feed self and hold cup, etc.  Grooming: Maximal assistance;Sitting (supported)  Upper Body Bathing: Total assistance;Bed level  Lower Body Bathing: Total assistance;Bed level  Upper Body Dressing : Total assistance;Sitting  Lower Body Dressing: Total assistance;Bed level  Toilet Transfer: Minimal assistance;Stand-pivot;BSC  Toilet Transfer Details (indicate cue type and reason): increased time and asisst for  balance  Toileting- Clothing Manipulation and Hygiene: Minimal assistance;Sit to/from stand  Functional mobility during ADLs: Minimal assistance (stand pivot transfers)  General ADL Comments: Pt significantly improved. She is very motivated. Requires several rest breaks, but motivated to continue    Mobility  Overal bed mobility: Needs Assistance  Bed Mobility: Supine to Sit  Supine to sit: Supervision;HOB elevated  Sit to supine: Total assist;+2 for physical assistance  General bed mobility comments: Able to initiate movement with rail and HOB elevated    Transfers  Overall transfer level: Needs assistance   Equipment used: Rolling walker (2 wheeled)  Transfers: Sit to/from Stand  Sit to Stand: Min assist  Stand pivot transfers: Min assist  General transfer comment: cueing for proper hand placement. Pt with decreased recall for hand placement within session.    Ambulation / Gait / Stairs / Wheelchair Mobility  Ambulation/Gait  Ambulation/Gait assistance: Occupational psychologist (Feet): 25 Feet  Assistive device: Rolling walker (2 wheeled)  Gait Pattern/deviations: Step-to pattern;Decreased step length - right;Decreased step length - left;Trunk flexed  Gait velocity: decreased  General Gait Details: Slow cadence with a definite step to/ start-stop pattern    Posture / Balance  Dynamic Sitting Balance  Sitting balance - Comments: Needed mod to min assist for sitting EOB for 4 minutes.    Special needs/care consideration  Skin blistering around lips, now drying. On Valtrex  Bowel mgmt: continent  Bladder mgmt: continent  Pt noted HOH new in left ear. Question wax build up.  Iv antibiotics day 8 of planned 14. Sister questions if she will be on home anitbiotics per discussion with Dr. Leveda Anna on 01/29/2104    Previous Home Environment  Living Arrangements: Alone  Lives With: Alone  Available Help at Discharge: Family;Friend(s);Available 24 hours/day  Type of Home: House  Home Layout: Two level;Bed/bath upstairs;1/2 bath on main level  Alternate Level Stairs-Rails: Right;Left  Alternate Level Stairs-Number of Steps: 5 steps a landing and then 8 steps  Home Access: Stairs to enter  Entrance Stairs-Rails: None  Entrance Stairs-Number of Steps: 1 step from the garage into the kitchen  Bathroom Shower/Tub: Medical sales representative: Standard  Bathroom Accessibility: Yes  How Accessible: Accessible via walker  Home Care Services: No  Additional Comments: 1/2 bath downstairs  Discharge Living Setting  Plans for Discharge Living Setting: Patient's home;Alone  Type of Home at  Discharge: House  Discharge Home Layout: Two level;1/2 bath on main level;Bed/bath upstairs  Alternate Level Stairs-Rails: Right;Left  Alternate Level Stairs-Number of Steps: 5 steps then a landing and then 8 steps  Discharge Home Access: Stairs to enter  Entrance Stairs-Rails: None  Entrance Stairs-Number of Steps: 1 step entry from the garage into the kitchen  Discharge Bathroom Shower/Tub: Tub/shower unit  Discharge Bathroom Toilet: Standard  Discharge Bathroom Accessibility: Yes  How Accessible: Accessible via walker  Does the patient have any problems obtaining your medications?: No  Social/Family/Support Systems  Patient Roles: Parent  Contact Information: Rosaura Carpenter, sister  Anticipated Caregiver: sisters in pt's home  Anticipated Caregiver's Contact Information: see above  Ability/Limitations of Caregiver: sisters will care for pt in her own home  Caregiver Availability: Other (Comment) (but Dondra Spry seems hesitant to totally commit to 24/7. Lives clo)  Discharge Plan Discussed with Primary Caregiver: Yes  Is Caregiver In Agreement with Plan?: Yes  Does Caregiver/Family have Issues with Lodging/Transportation while Pt is in Rehab?: No  Sister, Dondra Spry, lives close by. She seems hesitant to commit to  24/7 supervision, but pt's mentation has greatly improved over past three days  Goals/Additional Needs  Patient/Family Goal for Rehab: supervision wiht PT, OT and SLP  Expected length of stay: ELOS 14-18 days  Special Service Needs: sisters have been stayin gwith pt 24/7. Dondra Spry from [place], another sister from [facility] [place], son is here for a week who is a Arts development officer stationed in [place]  Pt/Family Agrees to Admission and willing to participate: Yes  Program Orientation Provided & Reviewed with Pt/Caregiver Including Roles & Responsibilities: Yes  Decrease burden of Care through IP rehab admission: n/a  Possible need for SNF placement upon discharge:not anticipated  Patient Condition: This patient's  medical and functional status has changed since the consult dated: 01/24/2014 in which the Rehabilitation Physician determined and documented that the patient's condition is appropriate for intensive rehabilitative care in an inpatient rehabilitation facility. See "History of Present Illness" (above) for medical update. Functional changes are: overall min to mod assist. Patient's medical and functional status update has been discussed with the Rehabilitation physician and patient remains appropriate for inpatient rehabilitation. Will admit to inpatient rehab today.  Preadmission Screen Completed By: Clois Dupes, 01/29/2014 12:04 PM  ______________________________________________________________________  Discussed status with Dr. Riley Kill on 01/29/2014 at 1206 and received telephone approval for admission today.  Admission Coordinator: Clois Dupes, time 4098 Date 01/29/2014.    Cosigned by: Ranelle Oyster, MD [01/29/2014 12:40 PM]

## 2014-01-30 NOTE — Evaluation (Signed)
Speech Language Pathology Assessment and Plan  Patient Details  Name: [name] [name] MRN: [id number] Date of Birth: 1951/02/13  SLP Diagnosis: Cognitive Impairments  Rehab Potential: Excellent ELOS: 7-10 days    Today's Date: 01/30/2014 SLP Individual Time: 0800-0900 SLP Individual Time Calculation (min): 60 min   Problem List:  Patient Active Problem List   Diagnosis Date Noted  . Meningitis due to bacteria 01/29/2014  . Demand myocardial ischemic related infarction 01/23/2014    Class: Diagnosis of  . Sepsis 01/18/2014  . Altered mental status 01/18/2014  . Depression, reactive 10/12/2012  . Chronic neutropenia 04/14/2012  . Hyperlipemia 10/20/2011  . Anemia 10/20/2011  . HTN (hypertension) 10/20/2011  . BMI 32.0-32.9,adult 10/20/2011   Past Medical History:  Past Medical History  Diagnosis Date  . Cataract   . Depression   . Anxiety   . HTN (hypertension)   . Hyperlipemia    Past Surgical History:  Past Surgical History  Procedure Laterality Date  . Cyst removal bilateral wrists      Assessment / Plan / Recommendation Clinical Impression [name] [name] is a 63 y.o. female with history of HTN, depression, anxiety disorder who was admitted on 01/18/14 with lethargy, fever 101.3 with leucocytosis and elevated troponin levels. She was started on IV antibiotics for SIRS and blood cultures done positive for strep Pneumo. LP with elevated protein >600 and WBC 11321. She was started on steroids as well as Vanc, Ceftriaxone and ampicillin for bacterial meningitis. CT head with slight asymmetry of cerebellar peduncles and Dr. Nicole Kindred recommended MRI brain for work up. MRI brain with diffusion abnormality around atrial of lateral ventricles and anterior to both frontal horns suggestive of meningitis and ventriculitis. ID consulted for input and recommends 14 days of IV ceftriaxone for treatment of Meningitis, ventriculitis and pneumococcal bacteremia. 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 Cardiology feels that patient with demand ischemia rather than NSTEMI. Patient with normal Myoview in August and 2 D echo with EF 55-60% with no wall abnormality and recommends outpatient Myoview as patient asymptomatic. Follow up MRI brain done 09/28 with slight improvement in scattered restriction foci and no acute infarct or abscess. Patient admitted to CIR on 01/29/14 and was administered a cognitive-linguistic evaluation and demonstrated mild cognitive impairments characterized by decreased emergent awareness and working memory which impacts her overall safety with functional tasks. Patient was also administered a BSE and did not demonstrate any overt s/s of aspiration with thin liquids via straw or trials of Dys. 3 textures. Patient demonstrated efficient mastication, however, reported sensitive gums and dentition due to a previous surgery and requested softer textures, therefore, recommend upgrading diet to Dys. 3 textures with thin liquids. Patient would benefit from skilled SLP intervention to maximize memory and awareness in order to maximize her functional independence prior to discharge.  Skilled Therapeutic Interventions          Patient administered a cognitive-linguistics and bedside swallow evaluation. Please see above for details. Patient educated in regards to current cognitive function and goals of skilled SLP intervention. Patient verbalized understanding.   SLP Assessment  Patient will need skilled Speech Lanaguage Pathology Services during CIR admission    Recommendations  Diet Recommendations: Dysphagia 3 (Mechanical Soft);Thin liquid (per patient preference) Liquid Administration via: Straw;Cup Medication Administration: Whole meds with liquid Supervision: Patient able to self feed;Staff to assist with self feeding Compensations: Slow rate;Small sips/bites Postural Changes and/or Swallow Maneuvers: Seated upright 90 degrees Oral Care Recommendations:  Oral care BID Recommendations  for Other Services: Neuropsych consult Patient destination: Home Follow up Recommendations: None Equipment Recommended: None recommended by SLP    SLP Frequency 3 out of 7 days   SLP Treatment/Interventions Cognitive remediation/compensation;Cueing hierarchy;Internal/external aids    Pain Pain Assessment Pain Assessment: No/denies pain  Prior Functioning Type of Home: House (Townhouse)  Lives With: Alone Available Help at Discharge: Family (sister can help 24 hours/day initially, intermittent after that) Education: 1 year college Vocation: Retired  Industrial/product designer Term Goals: Week 1: SLP Short Term Goal 1 (Week 1): Patient will utilize memory compensatory strategies to recall new, daily information with Mod I. SLP Short Term Goal 2 (Week 1): Patient will demonstrate emergent awareness of current functional level by identifying appropriate strategies to increase overall safety with functional tasks with Mod I.  See FIM for current functional status Refer to Care Plan for Long Term Goals  Recommendations for other services: Neuropsych  Discharge Criteria: Patient will be discharged from SLP if patient refuses treatment 3 consecutive times without medical reason, if treatment goals not met, if there is a change in medical status, if patient makes no progress towards goals or if patient is discharged from hospital.  The above assessment, treatment plan, treatment alternatives and goals were discussed and mutually agreed upon: by patient  Deshawn Skelley 01/30/2014, 12:17 PM

## 2014-01-30 NOTE — Progress Notes (Signed)
Patient information reviewed and entered into eRehab system by Birda Didonato, RN, CRRN, PPS Coordinator.  Information including medical coding and functional independence measure will be reviewed and updated through discharge.    

## 2014-01-30 NOTE — Evaluation (Signed)
Physical Therapy Assessment and Plan  Patient Details  Name: [name] [name] MRN: [id number] Date of Birth: [birth year]  PT Diagnosis: Difficulty walking, Edema and Muscle weakness Rehab Potential: Good ELOS: 7 days   Today's Date: 01/30/2014 PT Individual Time: 1400-1500 PT Individual Time Calculation (min): 60 min    Problem List:  Patient Active Problem List   Diagnosis Date Noted  . Meningitis due to bacteria 01/29/2014  . Demand myocardial ischemic related infarction 01/23/2014    Class: Diagnosis of  . Sepsis 01/18/2014  . Altered mental status 01/18/2014  . Depression, reactive 10/12/2012  . Chronic neutropenia 04/14/2012  . Hyperlipemia 10/20/2011  . Anemia 10/20/2011  . HTN (hypertension) 10/20/2011  . BMI 32.0-32.9,adult 10/20/2011    Past Medical History:  Past Medical History  Diagnosis Date  . Cataract   . Depression   . Anxiety   . HTN (hypertension)   . Hyperlipemia    Past Surgical History:  Past Surgical History  Procedure Laterality Date  . Cyst removal bilateral wrists      Assessment & Plan Clinical Impression: Patient is a 63 y.o. year old female with recent admission to the hospital history of HTN, depression, anxiety disorder who was admitted on 01/18/14 with lethargy, fever 101.3 with leucocytosis and elevated troponin levels. She was started on IV antibiotics for SIRS and blood cultures done positive for strep Pneumo. LP with elevated protein >600 and WBC 11321. She was started on steroids as well as Vanc, Ceftriaxone and ampicillin for bacterial meningitis. CT head with slight asymmetry of cerebellar peduncles and Dr. Nicole Kindred recommended MRI brain for work up. MRI brain with diffusion abnormality around atrial of lateral ventricles and anterior to both frontal horns suggestive of meningitis and ventriculitis. ID consulted for input and recommends 14 days of IV ceftriaxone for treatment of Meningitis, ventriculitis and pneumococcal bacteremia.  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 Cardiology feels that patient with demand ischemia rather than NSTEMI. Patient with normal Myoview in August and 2 D echo with EF 55-60% with no wall abnormality and recommends outpatient Myoview as patient asymptomatic. Follow up MRI brain done 09/28 with slight improvement in scattered restriction foci and no acute infarct or abscess.  Patient transferred to CIR on 01/29/2014 .   Patient currently requires min with mobility secondary to muscle weakness, decreased cardiorespiratoy endurance and decreased sitting balance, decreased standing balance and decreased balance strategies.  Prior to hospitalization, patient was independent  with mobility and lived with Alone in a House home.  Home access is 1 step from the garage into the kitchenStairs to enter.  Patient will benefit from skilled PT intervention to maximize safe functional mobility, minimize fall risk and decrease caregiver burden for planned discharge home with 24 hour supervision intially and then will have intermittent A.  Anticipate patient will benefit from follow up [place] at discharge.  PT - End of Session Activity Tolerance: Decreased this session (Pt orthostatic throughout session) Endurance Deficit: Yes PT Assessment Rehab Potential: Good PT Patient demonstrates impairments in the following area(s): Balance;Edema;Endurance;Other (comment) (strength) PT Transfers Functional Problem(s): Bed Mobility;Bed to Chair;Car;Furniture PT Locomotion Functional Problem(s): Ambulation;Stairs PT Plan PT Intensity: Minimum of 1-2 x/day ,45 to 90 minutes PT Frequency: 5 out of 7 days PT Duration Estimated Length of Stay: 7 days PT Treatment/Interventions: Ambulation/gait training;Balance/vestibular training;Community reintegration;Discharge planning;Disease management/prevention;DME/adaptive equipment instruction;Functional mobility training;Neuromuscular re-education;Pain management;Patient/family  education;Psychosocial support;Stair training;Therapeutic Activities;Therapeutic Exercise;UE/LE Strength taining/ROM;UE/LE Coordination activities;Wheelchair propulsion/positioning PT Transfers Anticipated Outcome(s): mod I  PT Locomotion Anticipated  Outcome(s): mod I gait; S stairs PT Recommendation Follow Up Recommendations: Home health PT Patient destination: Home Equipment Recommended: Rolling walker with 5" wheels  Skilled Therapeutic Intervention Individual treatment initiated with focus on transfer technique with cues for hand placement during sit to stands, gait training with RW, stair negotiation to prepare for home (bedroom on second level), and overall activity tolerance. Pt requiring rest breaks due to orthostatic symptoms (vitals taken. See below) and limited standing tolerance. End of session completed supine therex for LE strengthening including heel slides, hip abduction, bridging, and SAQ x 10 reps x 2 sets each BLE.  PT Evaluation Precautions/Restrictions Precautions Precautions: Fall Restrictions Weight Bearing Restrictions: No Pain  No complaints of pain. Vital Signs 110/37 mmHg seated (symptomatic) 102/61 mmHg seated rest break (symptoms improved) 97/62 mmHg after stairs (asymptomatic) Home Living/Prior Functioning Home Living Available Help at Discharge: Family (pt's sister can help 24/7 initially and then intermittent A) Type of Home: House Home Access: Stairs to enter [employer] of Steps: 1 step from the garage into the kitchen Entrance Stairs-Rails: None Home Layout: Two level;Bed/bath upstairs;1/2 bath on main level Alternate Level Stairs-Number of Steps: 5 steps to landing and then 8 steps Alternate Level Stairs-Rails: Right;Left  Lives With: Alone Prior Function Level of Independence: Independent with basic ADLs;Independent with homemaking with ambulation Driving: Yes  Cognition Overall Cognitive Status: Within Functional Limits for tasks  assessed Arousal/Alertness: Awake/alert Orientation Level: Oriented X4 Safety/Judgment: Appears intact Sensation Sensation Light Touch: Appears Intact Stereognosis: Not tested Hot/Cold: Not tested Proprioception: Not tested Coordination Gross Motor Movements are Fluid and Coordinated: Yes Fine Motor Movements are Fluid and Coordinated: Not tested Motor  Motor Motor: Within Functional Limits (general deconditioning)  Locomotion  Ambulation Ambulation/Gait Assistance: 4: Min guard  Trunk/Postural Assessment  Cervical Assessment Cervical Assessment: Within Functional Limits Thoracic Assessment Thoracic Assessment:  (slightly flexed posture ) Lumbar Assessment Lumbar Assessment:  (posterior pelvic tilt) Postural Control Postural Control: Within Functional Limits  Balance Balance Balance Assessed: Yes Static Sitting Balance Static Sitting - Level of Assistance: 5: Stand by assistance Dynamic Sitting Balance Dynamic Sitting - Level of Assistance: 5: Stand by assistance Static Standing Balance Static Standing - Level of Assistance: 4: Min assist Dynamic Standing Balance Dynamic Standing - Level of Assistance: 4: Min assist Extremity Assessment  RUE Assessment RUE Assessment: Within Functional Limits LUE Assessment LUE Assessment: Within Functional Limits RLE Assessment RLE Assessment: Exceptions to [facility] (grossly 4/5; decreased muscular endurance) LLE Assessment LLE Assessment: Exceptions to [facility] (grossly 4/5; decreased muscular endurance)  FIM:  FIM - Bed/Chair Transfer Bed/Chair Transfer Assistive Devices: Copy: 5: Supine > Sit: Supervision (verbal cues/safety issues);5: Sit > Supine: Supervision (verbal cues/safety issues);4: Bed > Chair or W/C: Min A (steadying Pt. > 75%);4: Chair or W/C > Bed: Min A (steadying Pt. > 75%) FIM - Locomotion: Wheelchair Locomotion: Wheelchair: 2: Travels 50 - 149 ft with supervision, cueing or coaxing FIM -  Locomotion: Ambulation Locomotion: Ambulation Assistive Devices: Administrator Ambulation/Gait Assistance: 4: Min guard Locomotion: Ambulation: 2: Travels 50 - 149 ft with minimal assistance (Pt.>75%) FIM - Locomotion: Stairs Locomotion: Scientist, physiological: Hand rail - 2 Locomotion: Stairs: 1: Up and Down < 4 stairs with minimal assistance (Pt.>75%)   Refer to Care Plan for Long Term Goals  Recommendations for other services: None  Discharge Criteria: Patient will be discharged from PT if patient refuses treatment 3 consecutive times without medical reason, if treatment goals not met, if there is a change  in medical status, if patient makes no progress towards goals or if patient is discharged from hospital.  The above assessment, treatment plan, treatment alternatives and goals were discussed and mutually agreed upon: by patient and by family  Allayne Gitelman 01/30/2014, 4:00 PM

## 2014-01-31 ENCOUNTER — Inpatient Hospital Stay (HOSPITAL_COMMUNITY): Payer: Federal, State, Local not specified - PPO

## 2014-01-31 ENCOUNTER — Inpatient Hospital Stay (HOSPITAL_COMMUNITY): Payer: Federal, State, Local not specified - PPO | Admitting: Speech Pathology

## 2014-01-31 ENCOUNTER — Encounter (HOSPITAL_COMMUNITY): Payer: Federal, State, Local not specified - PPO | Admitting: Occupational Therapy

## 2014-01-31 DIAGNOSIS — I951 Orthostatic hypotension: Secondary | ICD-10-CM

## 2014-01-31 DIAGNOSIS — I10 Essential (primary) hypertension: Secondary | ICD-10-CM

## 2014-01-31 DIAGNOSIS — F341 Dysthymic disorder: Secondary | ICD-10-CM

## 2014-01-31 MED ORDER — SORBITOL 70 % SOLN
60.0000 mL | Status: AC
  Administered 2014-01-31: 60 mL via ORAL
  Filled 2014-01-31: qty 60

## 2014-01-31 NOTE — Progress Notes (Signed)
Occupational Therapy Session Note  Patient Details  Name: [name] [name] MRN: [id number] Date of Birth: 01-20-1951  Today's Date: 01/31/2014 OT Individual Time: 1000-1100 OT Individual Time Calculation (min): 60 min    Short Term Goals: Week 1:  OT Short Term Goal 1 (Week 1): Short term goals= long term goals  Skilled Therapeutic Interventions/Progress Updates:  Pt seated in recliner and SW in room when arriving. Pt reporting no pain. Pt ambulated to bathroom w/ RW and t/f to toilet and voided. Pt doffed pants, underwear, and socks and ambulated and t/f to TTB in shower. Pt completed UB/LB bathing completing 2 sit to stand t/f. Pt ambulated to toilet, voided, and performed hygiene. Pt ambulated to room w/ RW and performed stand to sit t/f to bed. Pt performed UB/LB dressing, completing 2 sit to stand t/f. Pt brushed hair while seated on bed and ambulated to recliner w/ RW. Pt given 3 words to remember and was able to verbalize 3/3 after therapist stated them but remembered only 2/3 words 10 minutes later. Pt seated in recliner w/ all needs nearby and RN in room when leaving.   Therapy Documentation Precautions:  Precautions Precautions: Fall Restrictions Weight Bearing Restrictions: No  See FIM for current functional status  Therapy/Group: Individual Therapy  Sultan Pargas [name] 01/31/2014, 12:08 PM

## 2014-01-31 NOTE — Evaluation (Signed)
The skilled treatment note has been reviewed and SLP is in agreement.  Morningstar Toft, M.A., CCC-SLP  [phone]   

## 2014-01-31 NOTE — Progress Notes (Signed)
Speech Language Pathology Daily Session Note  Patient Details  Name: [name] [name] MRN: [id number] Date of Birth: 1950/09/10  Today's Date: 01/31/2014 SLP Individual Time: 1300-1400 SLP Individual Time Calculation (min): 60 min  Short Term Goals: Week 1: SLP Short Term Goal 1 (Week 1): Patient will utilize memory compensatory strategies to recall new, daily information with Mod I. SLP Short Term Goal 2 (Week 1): Patient will demonstrate emergent awareness of current functional level by identifying appropriate strategies to increase overall safety with functional tasks with Mod I.  Skilled Therapeutic Interventions: Skilled treatment session focused on cognitive-linguistic goals. Patient administered Montreal Cognitive Assessment [facility]) and scored a 24 out of 30 points which is indicative of mild cognitive impairments which was most evident in recall and short term memory. Patient reports baseline memory impairments with use of compensatory strategies at home and family members agreed. Patient independently reported need to use commode and required supervision verbal cues for safety with self care tasks. Continue with current plan of care.    FIM:  Comprehension Comprehension Mode: Auditory Comprehension: 5-Understands complex 90% of the time/Cues < 10% of the time Expression Expression Mode: Verbal Expression: 5-Expresses complex 90% of the time/cues < 10% of the time Social Interaction Social Interaction: 6-Interacts appropriately with others with medication or extra time (anti-anxiety, antidepressant). Problem Solving Problem Solving: 5-Solves basic 90% of the time/requires cueing < 10% of the time Memory Memory: 5-Recognizes or recalls 90% of the time/requires cueing < 10% of the time  Pain Pain Assessment Pain Assessment: No/denies pain  Therapy/Group: Individual Therapy  Welborn Keena 01/31/2014, 3:26 PM

## 2014-01-31 NOTE — Progress Notes (Signed)
[facility] Individual Statement of Services  Patient Name:  [name] [name]  Date:  01/31/2014  Welcome to the [facility].  Our goal is to provide you with an individualized program based on your diagnosis and situation, designed to meet your specific needs.  With this comprehensive rehabilitation program, you will be expected to participate in at least 3 hours of rehabilitation therapies Monday-Friday, with modified therapy programming on the weekends.  Your rehabilitation program will include the following services:  Physical Therapy (PT), Occupational Therapy (OT), Speech Therapy (ST), 24 hour per day rehabilitation nursing, Neuropsychology, Case Management (Social Worker), Rehabilitation Medicine, Nutrition Services and Pharmacy Services  Weekly team conferences will be held on Tuesdays to discuss your progress.  Your Social Worker will talk with you frequently to get your input and to update you on team discussions.  Team conferences with you and your family in attendance may also be held.  Expected length of stay:  7 to 10 days Overall anticipated outcome:  Modified Independent with supervision for car transfers and stairs  Depending on your progress and recovery, your program may change. Your Social Worker will coordinate services and will keep you informed of any changes. Your Social Worker's name and contact numbers are listed  below.  The following services may also be recommended but are not provided by the [facility]:   Driving Evaluations  Home Health Rehabiltiation Services  Outpatient Rehabilitation Services   Arrangements will be made to provide these services after discharge if needed.  Arrangements include referral to agencies that provide these services.  Your insurance has been verified to be:  [employer] Your primary doctor is:  Dr. Ellamae Sia  Pertinent information will be shared with your doctor  and your insurance company.  Social Worker:  Staci Acosta, LCSW  [phone] or ([phone]  Information discussed with and copy given to patient by: Elvera Lennox, 01/31/2014, 12:38 PM

## 2014-01-31 NOTE — Progress Notes (Signed)
The skilled treatment note has been reviewed and SLP is in agreement.  Selby Slovacek, M.A., CCC-SLP  [phone]   

## 2014-01-31 NOTE — IPOC Note (Signed)
Overall Plan of Care [facility]) Patient Details Name: MYIA KEGEL MRN: [id number] DOB: 1950/05/18  Admitting Diagnosis: Bacterial meningitis  Hospital Problems: Active Problems:   Meningitis due to bacteria     Functional Problem List: Nursing Medication Management;Endurance;Safety;Skin Integrity  PT Balance;Edema;Endurance;Other (comment) (strength)  OT Balance;Cognition;Endurance;Safety;Skin Integrity  SLP Cognition  TR         Basic ADL's: OT Grooming;Bathing;Dressing;Toileting     Advanced  ADL's: OT Simple Meal Preparation;Laundry     Transfers: PT Bed Mobility;Bed to Chair;Car;Furniture  OT Toilet;Tub/Shower     Locomotion: PT Ambulation;Stairs     Additional Impairments: OT    SLP Social Cognition   Memory;Awareness  TR      Anticipated Outcomes Item Anticipated Outcome  Self Feeding    Swallowing      Basic self-care  Mod I  Toileting  Mod I   Bathroom Transfers Mod I  Bowel/Bladder  MOD I  Transfers  mod I   Locomotion  mod I gait; S stairs  Communication     Cognition  Mod I   Pain  Pain less than 3  Safety/Judgment  Remain safe while on the unit   Therapy Plan: PT Intensity: Minimum of 1-2 x/day ,45 to 90 minutes PT Frequency: 5 out of 7 days PT Duration Estimated Length of Stay: 7 days OT Intensity: Minimum of 1-2 x/day, 45 to 90 minutes OT Frequency: 5 out of 7 days OT Duration/Estimated Length of Stay: 7-10 days SLP Intensity: Minumum of 1-2 x/day, 30 to 90 minutes SLP Frequency: 3 out of 7 days SLP Duration/Estimated Length of Stay: 7-10 days       Team Interventions: Nursing Interventions Patient/Family Education;Medication Management;Skin Care/Wound Management;Discharge Planning;Psychosocial Support;Dysphagia/Aspiration Precaution Training  PT interventions Ambulation/gait training;Balance/vestibular training;Community reintegration;Discharge planning;Disease management/prevention;DME/adaptive equipment  instruction;Functional mobility training;Neuromuscular re-education;Pain management;Patient/family education;Psychosocial support;Stair training;Therapeutic Activities;Therapeutic Exercise;UE/LE Strength taining/ROM;UE/LE Coordination activities;Wheelchair propulsion/positioning  OT Interventions Community reintegration;Discharge planning;Cognitive remediation/compensation;Psychosocial support;Functional mobility training;DME/adaptive equipment instruction;Patient/family education;Self Care/advanced ADL retraining;Therapeutic Activities  SLP Interventions Cognitive remediation/compensation;Cueing hierarchy;Internal/external aids  TR Interventions    SW/CM Interventions Discharge Planning;Psychosocial Support;Patient/Family Education    Team Discharge Planning: Destination: PT-Home ,OT- Home , SLP-Home Projected Follow-up: PT-Home health PT, OT-  None, SLP-None Projected Equipment Needs: PT-Rolling walker with 5" wheels, OT- To be determined, SLP-None recommended by SLP Equipment Details: PT- , OT-  Patient/family involved in discharge planning: PT- Patient;Family member/caregiver,  OT-Patient, SLP-Patient  MD ELOS: 7-10 days Medical Rehab Prognosis:  Excellent Assessment: The patient has been admitted for CIR therapies with the diagnosis of bacterial meningitis. The team will be addressing functional mobility, strength, stamina, balance, safety, adaptive techniques and equipment, self-care, bowel and bladder mgt, patient and caregiver education, NMR, cognitive perceptual rx, pain control, community reintegration, . Goals have been set at mod I or mobility, self-care and cognition. She has a supportive family unit.Ranelle Oyster, MD, FAAPMR      See Team Conference Notes for weekly updates to the plan of care

## 2014-01-31 NOTE — Progress Notes (Signed)
Social Work Assessment and Plan  Patient Details  Name: [name] [name] MRN: [id number] Date of Birth: [birth year]  Today's Date: 01/31/2014  Problem List:  Patient Active Problem List   Diagnosis Date Noted  . Meningitis due to bacteria 01/29/2014  . Demand myocardial ischemic related infarction 01/23/2014    Class: Diagnosis of  . Sepsis 01/18/2014  . Altered mental status 01/18/2014  . Depression, reactive 10/12/2012  . Chronic neutropenia 04/14/2012  . Hyperlipemia 10/20/2011  . Anemia 10/20/2011  . HTN (hypertension) 10/20/2011  . BMI 32.0-32.9,adult 10/20/2011   Past Medical History:  Past Medical History  Diagnosis Date  . Cataract   . Depression   . Anxiety   . HTN (hypertension)   . Hyperlipemia    Past Surgical History:  Past Surgical History  Procedure Laterality Date  . Cyst removal bilateral wrists     Social History:  reports that she quit smoking about 2 years ago. Her smoking use included Cigarettes. She has a 20 pack-year smoking history. She does not have any smokeless tobacco history on file. She reports that she does not drink alcohol or use illicit drugs.  Family / Support Systems Marital Status: Divorced How Long?: over 30 years Patient Roles: Parent (son is a Company secretary stationed in [place]) Other Supports: Maxine Glenn - sister  [phone] Anticipated Caregiver: sisters in pt's home, although pt's goals are mostly mod I Ability/Limitations of Caregiver: sisters will care for pt in her own home, if she needs assistance, but pt does not want to be a burden Caregiver Availability: Intermittent Family Dynamics: large, close family, but many members live in other cities/states  Social History Preferred language: English Religion: Baptist Read: Yes Write: Yes Employment Status: Retired Date Retired/Disabled/Unemployed: January 2008 Age Retired: 89 Public relations account executive Issues: None reported Guardian/Conservator: N/A   Abuse/Neglect Physical  Abuse: Denies Verbal Abuse: Denies Sexual Abuse: Denies Exploitation of patient/patient's resources: Denies Self-Neglect: Denies  Emotional Status Pt's affect, behavior and adjustment status: Pt was tearful as she thought about being a burden to her sisters.  Otherwise, she is taking things as they come. Recent Psychosocial Issues: None reported Psychiatric History: Pt with a hx of depression and anxiety and is on Lexapro. Substance Abuse History: None reported  Patient / Family Perceptions, Expectations & Goals Pt/Family understanding of illness & functional limitations: Pt reports a good understanding of her illness and is rightfully frustrated by it. Premorbid pt/family roles/activities: Pt enjoys reading, going to lunch with friends, doing puzzles, attending and volunteering at church.  She just started going to an exercise class with a neighbor. Anticipated changes in roles/activities/participation: Pt wants to do her activities as she is able. Pt/family expectations/goals: Pt doesn't "want to rush things."  She is glad to have the opportunity to receive rehabilitation.  Community Resources Express Scripts: None Premorbid Home Care/DME Agencies: None Transportation available at discharge: family Resource referrals recommended: Neuropsychology;Support group (specify)  Discharge Planning Living Arrangements: Alone Support Systems: Children;Other relatives Type of Residence: Private residence Insurance Resources: Multimedia programmer (specify) Cabin crew) Museum/gallery curator Resources: Fish farm manager;Other (Comment) (retirement) Financial Screen Referred: No Living Expenses: Mortgage Money Management: Patient Does the patient have any problems obtaining your medications?: No Home Management: Pt has been doing the inside home management.  She lives in a townhome, so they take care of the exterior. Patient/Family Preliminary Plans: Pt plans to return her home with support from her  sisters, as needed, but she does not want to be a burden to them. Barriers  to Discharge: Steps Social Work Anticipated Follow Up Needs: HH/OP Expected length of stay: ELOS 14-18 days  Clinical Impression CSW met with pt to introduce self and role of CSW, as well as complete assessment.  Pt was appreciative of CSW visit and was tearful as she talked about being worried she was going to be a burden to her family.  She is used to being independent and wants to be a burden to no one.  CSW provided support and acknowledged her feelings.  Pt's goals are set for mostly mod I, so pt's needs for her family will hopefully be minimal.  Neuropsychology set to see pt next week.  CSW will continue to follow pt and assist as needed.    Mare Ludtke, Silvestre Mesi 01/31/2014, 2:45 PM

## 2014-01-31 NOTE — Progress Notes (Signed)
Note reviewed and accurately reflects treatment session.   

## 2014-01-31 NOTE — Progress Notes (Signed)
Physical Therapy Session Note  Patient Details  Name: [name] [name] MRN: [id number] Date of Birth: August 02, 1950  Today's Date: 01/31/2014 PT Individual Time:  -      Short Term Goals: Week 1:  PT Short Term Goal 1 (Week 1): = LTGs due to short LOS  Skilled Therapeutic Interventions/Progress Updates:  Therapeutic activity: sitting, donning shoes, socks, pants, bra and top in sitting> standing.  SOBOE 3/4 requiring rest breaks.  Recliner>< w/c>< mat transfers, stand pivot with min guard assist.  Scooting forward/backward on mat x 3 for transfer training. Standing during bil hand task, x 2 minutes for standing tolerance.  Standing at end of session x 1 minute during R hand task- BP dropped.  C/o worse lightheadedness  Pt c/o lightheadedness throughout tx, worse with standing.  See vitals.  Therapeutic exercise performed with trunk and LEs to increase strength for functional mobility: 10 x 1 each:bil hip adduction in sitting, marching in standing, R and L knee long arc quad extension with 2# ankle weights, DF and isometric quad ext at end range.  Diaphragmatic breathing instruction; pt performed x 5 each.  Sidestepping at railing in hallway, x 5 steps R and L each.  Pt exhausted, mod assist to sit due to poor eccentric control.  Returned to room; stand pivot transfer to recliner with min/mod assist, due to slight LOB.  Legs elevated and all needs placed within reach.  Pt reported that she had stood up from the toilet by herself this AM, because she was left alone on the toilet.  Fall precautions discussed, and RN informed.    Therapy Documentation Precautions:  Precautions Precautions: Fall Restrictions Weight Bearing Restrictions: No   Vital Signs: Therapy Vitals Pulse Rate: 77 BP: 115/57 mmHg, later 106/61 in sitting Patient Position (if appropriate): Sitting Pain: Pain Assessment Pain Assessment: No/denies pain Pain Score: 0-No pain      See FIM for current functional  status  Therapy/Group: Individual Therapy  Elleanor Guyett 01/31/2014, 8:25 AM

## 2014-01-31 NOTE — Progress Notes (Signed)
[facility] PHYSICAL MEDICINE & REHABILITATION     PROGRESS NOTE    Subjective/Complaints: Overall feeling well. Had some low bp's yesterday.   Objective: Vital Signs: Blood pressure 99/64, pulse 83, temperature 98.2 F (36.8 C), temperature source Oral, resp. rate 19, height 5\' 6"  (1.676 m), weight 106.414 kg (234 lb 9.6 oz), SpO2 100.00%. No results found.  Recent Labs  01/28/14 1118 01/30/14 0810  WBC 7.1 5.6  HGB 11.8* 11.6*  HCT 37.2 37.0  PLT 278 306    Recent Labs  01/28/14 1118 01/30/14 0810  NA 134* 137  K 4.4 4.1  CL 101 101  GLUCOSE 104* 123*  BUN 18 16  CREATININE 0.62 0.72  CALCIUM 8.4 9.0   CBG (last 3)  No results found for this basename: GLUCAP,  in the last 72 hours  Wt Readings from Last 3 Encounters:  01/30/14 106.414 kg (234 lb 9.6 oz)  01/27/14 108.5 kg (239 lb 3.2 oz)  10/17/13 107.956 kg (238 lb)    Physical Exam:   Constitutional: She is oriented to person, place, and time. She appears well-developed and well-nourished. Multiple scabbed herpetic lesions on lips.  HENT: oral mucosa pink and moist  Head: Normocephalic and atraumatic.  Eyes: Conjunctivae are normal. Pupils are equal, round, and reactive to light.  Neck: Normal range of motion. Neck supple.  Cardiovascular: Normal rate and regular rhythm.  Respiratory: Effort normal and breath sounds normal. No respiratory distress. She has no wheezes.  GI: Soft. Bowel sounds are normal. She exhibits no distension. There is no tenderness.  Musculoskeletal: She exhibits no edema and no tenderness.  Neurological: She is alert and oriented to person, place, and time.  Speech normal.  Improved insight and awareness.  motor strength is 4/5 bilateral deltoid, biceps, triceps, grip. 3 minus bilateral hip flexors, 3 knee extensors ankle dorsiflexors and plantar flexors  Sensation intact to light touch bilateral upper and lower limbs  Deep tendon reflexes 3+ bilateral biceps, triceps,  brachioradialis, patellar, 1+ at the ankles  Skin: Skin is warm and dry.   Assessment/Plan: 1. Functional deficits secondary to bacterial meningitis which require 3+ hours per day of interdisciplinary therapy in a comprehensive inpatient rehab setting. Physiatrist is providing close team supervision and 24 hour management of active medical problems listed below. Physiatrist and rehab team continue to assess barriers to discharge/monitor patient progress toward functional and medical goals. FIM:    FIM - Upper Body Dressing/Undressing Upper body dressing/undressing steps patient completed: Thread/unthread right bra strap;Thread/unthread left bra strap;Hook/unhook bra;Thread/unthread right sleeve of pullover shirt/dresss;Thread/unthread left sleeve of pullover shirt/dress;Put head through opening of pull over shirt/dress;Pull shirt over trunk Upper body dressing/undressing: 5: Set-up assist to: Obtain clothing/put away FIM - Lower Body Dressing/Undressing Lower body dressing/undressing steps patient completed: Thread/unthread right pants leg;Thread/unthread left pants leg;Pull pants up/down;Don/Doff right sock;Don/Doff left sock Lower body dressing/undressing: 5: Set-up assist to: Obtain clothing  FIM - Toileting Toileting steps completed by patient: Adjust clothing prior to toileting;Performs perineal hygiene;Adjust clothing after toileting Toileting Assistive Devices: Grab bar or rail for support Toileting: 5: Supervision: Safety issues/verbal cues  FIM - Diplomatic Services operational officerToilet Transfers Toilet Transfers Assistive Devices: Best boyWalker;Grab bars Toilet Transfers: 4-To toilet/BSC: Min A (steadying Pt. > 75%);4-From toilet/BSC: Min A (steadying Pt. > 75%)  FIM - Bed/Chair Transfer Bed/Chair Transfer Assistive Devices: Therapist, occupationalWalker Bed/Chair Transfer: 5: Supine > Sit: Supervision (verbal cues/safety issues);5: Sit > Supine: Supervision (verbal cues/safety issues);4: Bed > Chair or W/C: Min A (steadying Pt. > 75%);4:  Chair  or W/C > Bed: Min A (steadying Pt. > 75%)  FIM - Locomotion: Wheelchair Locomotion: Wheelchair: 2: Travels 50 - 149 ft with supervision, cueing or coaxing FIM - Locomotion: Ambulation Locomotion: Ambulation Assistive Devices: Designer, industrial/product Ambulation/Gait Assistance: 4: Min guard Locomotion: Ambulation: 2: Travels 50 - 149 ft with minimal assistance (Pt.>75%)  Comprehension Comprehension Mode: Auditory Comprehension: 5-Understands complex 90% of the time/Cues < 10% of the time  Expression Expression Mode: Verbal Expression: 5-Expresses basic needs/ideas: With no assist  Social Interaction Social Interaction: 6-Interacts appropriately with others with medication or extra time (anti-anxiety, antidepressant).  Problem Solving Problem Solving: 5-Solves complex 90% of the time/cues < 10% of the time  Memory Memory: 5-Recognizes or recalls 90% of the time/requires cueing < 10% of the time  Medical Problem List and Plan:  1. Functional deficits secondary to Bacterial meningitis with decreased balance and LE strength.  2. DVT Prophylaxis/Anticoagulation: Pharmaceutical: Lovenox  3. Pain Management: Tylenol prn pain.  4. Mood: Will continue Lexapro. LCSW to follow for evaluation and support.  5. Neuropsych: This patient is capable of making decisions on her own behalf.  6. Skin/Wound Care: Routine pressure measures. Maintain adequate nutrition and hydration status.  7. Fluids/Electrolytes/Nutrition: Monitor I/O. Continue ensure supplements bid. Check lytes today.  8. Sepsis due to bacterial meningitis: IV ceftriaxone to continue thorough today 9. HSV stomatitis: Continue Valtrex D # 7 of 7   10. HTN: Monitor BP every 8 hours. Continue   coreg. Lisinopril held 11. Elevated troponin: Negatie Myoview study Aug 2014. Follow up study past discharge.  12. Hyponatermia: labs improved 13. Constipation: rx with sorbitol 60 today, f/u with sse if needed  LOS (Days) 2 A FACE TO FACE  EVALUATION WAS PERFORMED  SWARTZ,ZACHARY T 01/31/2014 11:15 AM

## 2014-02-01 ENCOUNTER — Encounter (HOSPITAL_COMMUNITY): Payer: Federal, State, Local not specified - PPO | Admitting: Occupational Therapy

## 2014-02-01 ENCOUNTER — Inpatient Hospital Stay (HOSPITAL_COMMUNITY): Payer: Federal, State, Local not specified - PPO | Admitting: Occupational Therapy

## 2014-02-01 ENCOUNTER — Inpatient Hospital Stay (HOSPITAL_COMMUNITY): Payer: Federal, State, Local not specified - PPO

## 2014-02-01 ENCOUNTER — Inpatient Hospital Stay (HOSPITAL_COMMUNITY): Payer: Federal, State, Local not specified - PPO | Admitting: Speech Pathology

## 2014-02-01 DIAGNOSIS — R5381 Other malaise: Secondary | ICD-10-CM | POA: Diagnosis present

## 2014-02-01 MED ORDER — SENNOSIDES-DOCUSATE SODIUM 8.6-50 MG PO TABS
2.0000 | ORAL_TABLET | Freq: Every day | ORAL | Status: DC
  Administered 2014-02-01 – 2014-02-07 (×7): 2 via ORAL
  Filled 2014-02-01 (×8): qty 2

## 2014-02-01 NOTE — Progress Notes (Deleted)
Patient Details  Name: [name] [name]  MRN: [id number]  Date of Birth: Feb 14, 1951   Today's Date: 02/01/2014  OT Individual Time: 2:30-3:00  OT Individual Time Calculation (min): 30 min   Short Term Goals:  Week 1: OT Short Term Goal 1 (Week 1): Short term goals= long term goals   Skilled Therapeutic Interventions/Progress Updates:  Pt seated in recliner w/ sister in room when arriving. Pt doffed shirt, donned bra, and donned shirt while seated in recliner. Pt ambulated to w/c using RW and self-propelled w/c down hallway until reaching door outside of apartment. Pt brought downstairs to gift shop and ambulated throughout aisles using RW, to work on endurance, safety w/ RW, navigating in community w/ RW, and dynamic standing balance. Pt had 1 LOB when sidestepping through aisle. Pt educated on using RW safely in community and deciding when sidestepping through a space, such as a bathroom door, would be necessary. Pt reported lightheadedness and fatigue and required 2 seated rest breaks. Pt brought to 4th floor and self-propelled down hallway until needing a break. Pt brought to room and ambulated to bathroom using RW, completed toilet t/f using grab bars, voided, ambulated to sink and washed hands, and ambulated to recliner. Pt demonstrated problems w/ short term memory throughout session, including being unable to recall therapist's name, which direction rehab gym was in hallway, and if she had any following sessions. Pt seated in recliner w/ all needs within reach when leaving.   Therapy Documentation  Precautions:  Precautions  Precautions: Fall  Restrictions  Weight Bearing Restrictions: No   See FIM for current functional status   Therapy/Group: Individual Therapy  Stephanee Barcomb [name]  02/01/2014, 3:03 PM

## 2014-02-01 NOTE — Progress Notes (Signed)
Occupational Therapy Session Note  Patient Details  Name: [name] [name] MRN: [id number] Date of Birth: 1950-12-19  Today's Date: 02/01/2014 OT Individual Time: 10:00-11:00 OT Individual Calculated Time (min): 60 min   Short Term Goals: Week 1:  OT Short Term Goal 1 (Week 1): Short term goals= long term goals  Skilled Therapeutic Interventions/Progress Updates:  Pt seated in recliner when arriving. Pt reporting no pain. Pt recalled 1/3 words given to her yesterday (tree, cat, paper), however recalled 2 words that were given to her by SLP. Pt demonstrating decreased short term memory, as requiring min v/c for recalling when next session would be, even when reminded by therapist. Pt donned socks and ambulated to toilet, voided, and ambulated to TTB in shower using RW. Pt completed UB/LB bathing, performing 2 sit to stand t/f, and ambulated to bed to complete UB/LB dressing. Pt gathered clothing from closet while ambulating to her bed. Pt ambulated to w/c and completed grooming tasks of brushing teeth and brushing hair while seated in w/c at sink. Pt self-propelled w/c to tub room and ambulated to TTB using RW. Pt became lightheaded and rested while seated on TTB for 5 min. Lightheadedness subsided, and complained of fatigue. Pt completed shower t/f on TTB and was educated on shower safety, including installing grab bars in shower at home. Pt t/f out of tub and ambulated to w/c, was brought to room, and ambulated to recliner using RW. Pt seated in recliner w/ all needs nearby when leaving.   Therapy Documentation Precautions:  Precautions Precautions: Fall Restrictions Weight Bearing Restrictions: No  See FIM for current functional status  Therapy/Group: Individual Therapy  Jevan Gaunt [name] 02/01/2014, 11:03 AM

## 2014-02-01 NOTE — Progress Notes (Signed)
Note reviewed and accurately reflects treatment session.   

## 2014-02-01 NOTE — Progress Notes (Signed)
Patient Details  Name: [name] [name]  MRN: [id number]  Date of Birth: [birth year]   Today's Date: 02/01/2014  OT Individual Time: 2:00-2:30  OT Individual Time Calculation (min): 30 min   Short Term Goals:  Week 1: OT Short Term Goal 1 (Week 1): Short term goals= long term goals   Skilled Therapeutic Interventions/Progress Updates:  Pt seated in recliner w/ sister in room when arriving. Pt doffed shirt, donned bra, and donned shirt while seated in recliner. Pt ambulated to w/c using RW and self-propelled w/c down hallway until reaching door outside of apartment. Pt brought downstairs to gift shop and ambulated throughout aisles using RW, to work on endurance, safety w/ RW, navigating in community w/ RW, and dynamic standing balance. Pt had 1 LOB when sidestepping through aisle. Pt educated on using RW safely in community and deciding when sidestepping through a space, such as a bathroom door, would be necessary. Pt reported lightheadedness and fatigue and required 2 seated rest breaks. Pt brought to 4th floor and self-propelled down hallway until needing a break. Pt brought to room and ambulated to bathroom using RW, completed toilet t/f using grab bars, voided, ambulated to sink and washed hands, and ambulated to recliner. Pt demonstrated problems w/ short term memory throughout session, including being unable to recall therapist's name, which direction rehab gym was in hallway, and if she had any following sessions. Pt seated in recliner w/ all needs within reach when leaving.   Therapy Documentation  Precautions:  Precautions  Precautions: Fall  Restrictions  Weight Bearing Restrictions: No   See FIM for current functional status  Therapy/Group: Individual Therapy  Akshat Minehart [name]  02/01/2014, 3:03 PM

## 2014-02-01 NOTE — Progress Notes (Signed)
Speech Language Pathology Session Note & Discharge Summary  Patient Details  Name: [name] [name] MRN: [id number] Date of Birth: 07-Jun-1950  Today's Date: 02/01/2014 SLP Individual Time: 0900-0930 SLP Individual Time Calculation (min): 30 min   Skilled Therapeutic Interventions:  Skilled treatment session focused on cognitive-linguistic goals. Patient was Mod I for problem solving and organization with a complex money management task and recalled events from previous therapy sessions with Mod I. Patient and family report that the patient is at her baseline level of cognitive function, therefore, skilled SLP intervention is no longer warranted at this time.   Patient has met 2 of 2 long term goals.  Patient to discharge at overall Modified Independent level.   Reasons goals not met: N/A   Clinical Impression/Discharge Summary: Patient has made functional gains and has met 2 of 2 LTG's this admission. Currently, patient is at her baseline level of cognitive-linguistic function and is overall Mod I to perform functional and familiar tasks safely. Patient is also consuming Dys. 3 textures with without difficulty and reported she would prefer to consume these textures for comfort due to a prior oral surgery.  Patient/family education complete and patient will be discharged from skilled SLP intervention due to patient being at her baseline level of cognitive functioning and all current goals being met.  Skilled f/u SLP intervention is not warranted at this time.    Recommendation:  None      Equipment: N/A   Reasons for discharge: Treatment goals met   Patient/Family Agrees with Progress Made and Goals Achieved: Yes   See FIM for current functional status  Roen Macgowan 02/01/2014, 9:45 AM

## 2014-02-01 NOTE — Progress Notes (Signed)
Physical Therapy Session Note  Patient Details  Name: [name] [name] MRN: [id number] Date of Birth: 12/04/1950  Today's Date: 02/01/2014 PT Individual Time:0805  - 0905, 60 min    Short Term Goals: Week 1:  PT Short Term Goal 1 (Week 1): = LTGs due to short LOS  Skilled Therapeutic Interventions/Progress Updates:  Therapeutic activities of dressing in sitting and standing with extra time and supervision; toilet transfer in room.  See FIM.  SOBOE of dressing, requiring sitting rest break.  Toilet transfer and toileting- see FIM.  Gait in room and gym on level tile, x 20', x 70' with min guard assist.  C/o lightheadedness, no worse when ambulating.  See vitals. Up/down 3 (5" ) steps, 2 rails, step to method leading with R foot, min guard assist, without either knee buckling.  Transfer to NuStep without use of RW; slight LOB when turning to place hand on armrest of NuStep, min assist to recover. Activity tolerance on NuStep at level 4, rated 12 on BORG scale, x 8 minutes, very slowly.  Returned to room; pt transferred to recliner with min assist.  All needs left within reach.    Therapy Documentation Precautions:  Precautions Precautions: Fall Restrictions Weight Bearing Restrictions: No   Vital Signs: Pulse Rate: 66 BP: 125/44 mmHg Patient Position (if appropriate): Sitting Oxygen Therapy SpO2: 100 % O2 Device: None (Room air) Pain:- none reported      See FIM for current functional status  Therapy/Group: Individual Therapy  Savan Ruta 02/01/2014, 5:06 PM

## 2014-02-01 NOTE — Progress Notes (Deleted)
Occupational Therapy Session Note  Patient Details  Name: [name] [name] MRN: [id number] Date of Birth: 1950/09/15  Today's Date: 02/01/2014 OT Individual Time: 2:30-3:30 OT Individual Time Calculation (min): 60 min    Short Term Goals: Week 1:  OT Short Term Goal 1 (Week 1): Short term goals= long term goals  Skilled Therapeutic Interventions/Progress Updates:  Pt seated in recliner w/ sister in room when arriving. Pt doffed shirt, donned bra, and donned shirt while seated in recliner. Pt ambulated to w/c using RW and self-propelled w/c down hallway until reaching door outside of apartment. Pt brought downstairs to gift shop and ambulated throughout aisles using RW, to work on endurance, safety w/ RW, navigating in community w/ RW, and dynamic standing balance. Pt had 1 LOB when sidestepping through aisle. Pt educated on using RW safely in community and deciding when sidestepping through a space, such as a bathroom door, would be necessary. Pt reported lightheadedness and fatigue and required 2 seated rest breaks. Pt brought to 4th floor and self-propelled down hallway until needing a break. Pt brought to room and ambulated to bathroom using RW, completed toilet t/f using grab bars, voided, ambulated to sink and washed hands, and ambulated to recliner. Pt demonstrated problems w/ short term memory throughout session, including being unable to recall therapist's name, which direction rehab gym was in hallway, and if she had any following sessions. Pt seated in recliner w/ all needs within reach when leaving.   Therapy Documentation Precautions:  Precautions Precautions: Fall Restrictions Weight Bearing Restrictions: No  See FIM for current functional status  Therapy/Group: Individual Therapy  [name] [name] 02/01/2014, 3:03 PM

## 2014-02-01 NOTE — Progress Notes (Signed)
[place] PHYSICAL MEDICINE & REHABILITATION     PROGRESS NOTE    Subjective/Complaints: bp's better today. Had a good day with therapies yesterday    Objective: Vital Signs: Blood pressure 126/70, pulse 74, temperature 98.3 F (36.8 C), temperature source Oral, resp. rate 18, height 5\' 6"  (1.676 m), weight 106.414 kg (234 lb 9.6 oz), SpO2 99.00%. No results found.  Recent Labs  01/30/14 0810  WBC 5.6  HGB 11.6*  HCT 37.0  PLT 306    Recent Labs  01/30/14 0810  NA 137  K 4.1  CL 101  GLUCOSE 123*  BUN 16  CREATININE 0.72  CALCIUM 9.0   CBG (last 3)  No results found for this basename: GLUCAP,  in the last 72 hours  Wt Readings from Last 3 Encounters:  01/30/14 106.414 kg (234 lb 9.6 oz)  01/27/14 108.5 kg (239 lb 3.2 oz)  10/17/13 107.956 kg (238 lb)    Physical Exam:   Constitutional: She is oriented to person, place, and time. She appears well-developed and well-nourished. Multiple scabbed herpetic lesions on lips.  HENT: oral mucosa pink and moist  Head: Normocephalic and atraumatic.  Eyes: Conjunctivae are normal. Pupils are equal, round, and reactive to light.  Neck: Normal range of motion. Neck supple.  Cardiovascular: Normal rate and regular rhythm.  Respiratory: Effort normal and breath sounds normal. No respiratory distress. She has no wheezes.  GI: Soft. Bowel sounds are normal. She exhibits no distension. There is no tenderness.  Musculoskeletal: She exhibits no edema and no tenderness.  Neurological: She is alert and oriented to person, place, and time.  Speech normal.  Improved insight and awareness.  motor strength is 4/5 bilateral deltoid, biceps, triceps, grip. 3 minus bilateral hip flexors, 3 knee extensors ankle dorsiflexors and plantar flexors  Sensation intact to light touch bilateral upper and lower limbs  Deep tendon reflexes 3+ bilateral biceps, triceps, brachioradialis, patellar, 1+ at the ankles  Skin: Skin is warm and dry.    Assessment/Plan: 1. Functional deficits secondary to bacterial meningitis which require 3+ hours per day of interdisciplinary therapy in a comprehensive inpatient rehab setting. Physiatrist is providing close team supervision and 24 hour management of active medical problems listed below. Physiatrist and rehab team continue to assess barriers to discharge/monitor patient progress toward functional and medical goals. FIM: FIM - Bathing Bathing Steps Patient Completed: Chest;Right Arm;Left Arm;Abdomen;Front perineal area;Buttocks;Right upper leg;Left upper leg;Right lower leg (including foot);Left lower leg (including foot) Bathing: 4: Steadying assist  FIM - Upper Body Dressing/Undressing Upper body dressing/undressing steps patient completed: Thread/unthread right bra strap;Thread/unthread left bra strap;Hook/unhook bra;Thread/unthread right sleeve of pullover shirt/dresss;Thread/unthread left sleeve of pullover shirt/dress;Put head through opening of pull over shirt/dress;Pull shirt over trunk Upper body dressing/undressing: 5: Set-up assist to: Obtain clothing/put away FIM - Lower Body Dressing/Undressing Lower body dressing/undressing steps patient completed: Thread/unthread right pants leg;Thread/unthread left pants leg;Pull pants up/down;Don/Doff right sock;Don/Doff left sock;Thread/unthread left underwear leg;Thread/unthread right underwear leg;Pull underwear up/down Lower body dressing/undressing: 5: Set-up assist to: Obtain clothing  FIM - Toileting Toileting steps completed by patient: Adjust clothing prior to toileting;Adjust clothing after toileting;Performs perineal hygiene Toileting Assistive Devices: Grab bar or rail for support Toileting: 5: Supervision: Safety issues/verbal cues  FIM - Diplomatic Services operational officer Devices: Best boy Transfers: 5-To toilet/BSC: Supervision (verbal cues/safety issues);5-From toilet/BSC: Supervision (verbal  cues/safety issues)  FIM - Bed/Chair Transfer Bed/Chair Transfer Assistive Devices: Manufacturing systems engineer Transfer: 5: Supine > Sit: Supervision (verbal cues/safety issues);5: Sit >  Supine: Supervision (verbal cues/safety issues);4: Bed > Chair or W/C: Min A (steadying Pt. > 75%);4: Chair or W/C > Bed: Min A (steadying Pt. > 75%)  FIM - Locomotion: Wheelchair Locomotion: Wheelchair: 2: Travels 50 - 149 ft with supervision, cueing or coaxing FIM - Locomotion: Ambulation Locomotion: Ambulation Assistive Devices: Designer, industrial/productWalker - Rolling Ambulation/Gait Assistance: 4: Min guard Locomotion: Ambulation: 2: Travels 50 - 149 ft with minimal assistance (Pt.>75%)  Comprehension Comprehension Mode: Auditory Comprehension: 6-Follows complex conversation/direction: With extra time/assistive device  Expression Expression Mode: Verbal Expression: 6-Expresses complex ideas: With extra time/assistive device  Social Interaction Social Interaction: 6-Interacts appropriately with others with medication or extra time (anti-anxiety, antidepressant).  Problem Solving Problem Solving: 6-Solves complex problems: With extra time  Memory Memory: 6-More than reasonable amt of time  Medical Problem List and Plan:  1. Functional deficits secondary to Bacterial meningitis with decreased balance and LE strength.  2. DVT Prophylaxis/Anticoagulation: Pharmaceutical: Lovenox  3. Pain Management: Tylenol prn pain.  4. Mood: Will continue Lexapro. LCSW to follow for evaluation and support.  5. Neuropsych: This patient is capable of making decisions on her own behalf.  6. Skin/Wound Care: Routine pressure measures. Maintain adequate nutrition and hydration status.  7. Fluids/Electrolytes/Nutrition: Monitor I/O. Continue ensure supplements bid. Check lytes today.  8. Sepsis due to bacterial meningitis: IV ceftriaxone to continue thorough today 9. HSV stomatitis: valtrex completed   10. HTN: Monitor BP every 8 hours. Continue    coreg. Lisinopril held 11. Elevated troponin: Negatie Myoview study Aug 2014. Follow up study after discharge.  12. Hyponatermia: labs improved 13. Constipation: + results yesterday  -maintenance medications  LOS (Days) 3 A FACE TO FACE EVALUATION WAS PERFORMED  Lynnann Knudsen T 02/01/2014 10:07 AM

## 2014-02-02 ENCOUNTER — Inpatient Hospital Stay (HOSPITAL_COMMUNITY): Payer: Federal, State, Local not specified - PPO | Admitting: Physical Therapy

## 2014-02-02 ENCOUNTER — Inpatient Hospital Stay (HOSPITAL_COMMUNITY): Payer: Federal, State, Local not specified - PPO | Admitting: Occupational Therapy

## 2014-02-02 ENCOUNTER — Inpatient Hospital Stay (HOSPITAL_COMMUNITY): Payer: Federal, State, Local not specified - PPO | Admitting: *Deleted

## 2014-02-02 NOTE — Progress Notes (Signed)
Occupational Therapy Session Note  Patient Details  Name: ADDELINE KASTLE MRN: [id number] Date of Birth: 04/02/1951  Today's Date: 02/02/2014 OT Individual Time: 1140-1210 OT Individual Time Calculation (min): 30 min    Skilled Therapeutic Interventions/Progress Updates: Patient completed dynamic standing activities and turning and reaching actitivities to work on balance and possible vestibular light headness symptoms (she also stated she has "wax build up" in her left ear).   Patient with no losses of balance.  Complained of fatigue and light headedness.    Therapy Documentation Precautions:  Precautions Precautions: Fall Restrictions Weight Bearing Restrictions: No  Pain: Pain Assessment Pain Assessment: No/denies pain  See FIM for current functional status  Therapy/Group: Individual Therapy  Bud Face [facility] 02/02/2014, 3:16 PM

## 2014-02-02 NOTE — Progress Notes (Signed)
Occupational Therapy Session Note  Patient Details  Name: [name] [name] MRN: [id number] Date of Birth: 11-01-1950  Today's Date: 02/02/2014 OT Individual Time: 4097-3532 OT Individual Time Calculation (min): 45 min    Skilled Therapeutic Interventions/Progress Updates: Patient c/o fatigue and light headedness.  Seated BP taken as follows:  BP taken per monitor=128/41  However when RN alerted and taken=128/58 10 minutes later.   Patient completed toileting with S walker level and UE exercises seated in recliner as she stated she was very tired.    Therapy Documentation Precautions:  Precautions Precautions: Fall Restrictions Weight Bearing Restrictions: No Pain: Pain Assessment Pain Assessment: No/denies pain  See FIM for current functional status  Therapy/Group: Individual Therapy  Bud Face [facility] 02/02/2014, 3:20 PM

## 2014-02-02 NOTE — Plan of Care (Signed)
Problem: RH KNOWLEDGE DEFICIT Goal: RH STG INCREASE KNOWLEDGE OF HYPERTENSION Patient verbalizes management of hypertension with min assistance  Outcome: Progressing Pt is keeping track of how her BP is running and asking questions about meds and fluctuations in BP.

## 2014-02-02 NOTE — Progress Notes (Signed)
Physical Therapy Session Note  Patient Details  Name: [name] [name] MRN: [id number] Date of Birth: 1951/01/15  Today's Date: 02/02/2014 PT Individual Time: 1410-1457 PT Individual Time Calculation (min): 47 min   Short Term Goals: Week 1:  PT Short Term Goal 1 (Week 1): = LTGs due to short LOS  Skilled Therapeutic Interventions/Progress Updates:    Pt received seated in recliner; agreeable to therapy. Pt reporting "lightheadedness" and "hazy vision" in seated and standing today. Pt suspects that BP is decreasing when standing up; however, no significant change in BP, lightheadedness with sit>stand at this time. See vital signs below for detailed readings. Per RN, pt has been receiving medication in L ear to address congestion. Brief oculomotor and vestibular assessment revealed the following: L Head Thrust Test (+) and symptomatic (consistent with L unilateral vestibular hypofunction); dynamic visual acuity abnormal with increased symptoms of visual "haziness". No abnormal oculomotor findings. Explained, demonstrated dynamic visual acuity exercises and provided paper handout and eye charts for carryover. Pt gave effective return demonstration of exercises. Pt ambulated to/from bathroom with rolling walker and close supervision to min guard (to negotiate small threshold in bathroom). Departed with pt semi reclined in bed with 3 rails up, family members present, and all needs within reach.  Therapy Documentation Precautions:  Precautions Precautions: Fall Restrictions Weight Bearing Restrictions: No Vital Signs: Therapy Vitals Sitting: Pulse Rate: 62 BP: 122/52 mmHg Standing: Pulse Rate: 85 BP: 113/65 mmHg Pain: Pain Assessment Pain Assessment: No/denies pain Locomotion : Ambulation Ambulation/Gait Assistance: 5: Supervision;4: Min guard   See FIM for current functional status  Therapy/Group: Individual Therapy  Hobble, Lorenda Ishihara 02/02/2014, 3:16 PM

## 2014-02-02 NOTE — Progress Notes (Signed)
Occupational Therapy Session Note  Patient Details  Name: [name] [name] MRN: [id number] Date of Birth: 10/16/1950  Today's Date: 02/02/2014 OT Individual Time: 1600-1700  (60 min)  OT Individual Time Calculation (min): 60 min    Short Term Goals: Week 1:  OT Short Term Goal 1 (Week 1): Short term goals= long term goals      Skilled Therapeutic Interventions/Progress Updates:    Pt. Lying in bed upon OT arrival.  BP=  135/75  Supine.  BP sitting= 133/71.  Transferred to wc with min assist.  Propelled wc to bym.  Transferred to NuStep.  Did 5 minutes for 18=94 step at 2 workload.  BP= 126/60.  Did 5 more minutes on nustep for 205 steps.  BP=  129/54.  Propelled pt back to room.  Pt. Sat in wc and washed face at sink and positioned wc for dinner tray.    Therapy Documentation Precautions:  Precautions Precautions: Fall Restrictions Weight Bearing Restrictions: No Vital Signs:  Pain: Pain Assessment Pain Assessment: No/denies pain           See FIM for current functional status  Therapy/Group: Individual Therapy  Humberto Seals 02/02/2014, 4:45 PM

## 2014-02-02 NOTE — Plan of Care (Signed)
Problem: RH BOWEL ELIMINATION Goal: RH STG MANAGE BOWEL W/MEDICATION W/ASSISTANCE STG Manage Bowel with Medication with Assistance. MOD I  Outcome: Not Progressing LBM 10/1 after SSE per notes

## 2014-02-02 NOTE — Progress Notes (Signed)
[place] PHYSICAL MEDICINE & REHABILITATION     PROGRESS NOTE    Subjective/Complaints: Had a good day yesterday. Pt noted that bp's were up. Denies dizzines    Objective: Vital Signs: Blood pressure 120/47, pulse 65, temperature 97.9 F (36.6 C), temperature source Oral, resp. rate 18, height 5\' 6"  (1.676 m), weight 106.414 kg (234 lb 9.6 oz), SpO2 97.00%. No results found. No results found for this basename: WBC, HGB, HCT, PLT,  in the last 72 hours No results found for this basename: NA, K, CL, CO, GLUCOSE, BUN, CREATININE, CALCIUM,  in the last 72 hours CBG (last 3)  No results found for this basename: GLUCAP,  in the last 72 hours  Wt Readings from Last 3 Encounters:  01/30/14 106.414 kg (234 lb 9.6 oz)  01/27/14 108.5 kg (239 lb 3.2 oz)  10/17/13 107.956 kg (238 lb)    Physical Exam:   Constitutional: She is oriented to person, place, and time. She appears well-developed and well-nourished. Multiple scabbed herpetic lesions on lips have almost resolved HENT: oral mucosa pink and moist  Head: Normocephalic and atraumatic.  Eyes: Conjunctivae are normal. Pupils are equal, round, and reactive to light.  Neck: Normal range of motion. Neck supple.  Cardiovascular: Normal rate and regular rhythm.  Respiratory: Effort normal and breath sounds normal. No respiratory distress. She has no wheezes.  GI: Soft. Bowel sounds are normal. She exhibits no distension. There is no tenderness.  Musculoskeletal: She exhibits no edema and no tenderness.  Neurological: She is alert and oriented to person, place, and time.  Speech normal.  Improved insight and awareness.  motor strength is 4/5 bilateral deltoid, biceps, triceps, grip. 3 minus bilateral hip flexors, 3 knee extensors ankle dorsiflexors and plantar flexors  Sensation intact to light touch bilateral upper and lower limbs  Deep tendon reflexes 3+ bilateral biceps, triceps, brachioradialis, patellar, 1+ at the ankles  Skin: Skin  is warm and dry.   Assessment/Plan: 1. Functional deficits secondary to bacterial meningitis which require 3+ hours per day of interdisciplinary therapy in a comprehensive inpatient rehab setting. Physiatrist is providing close team supervision and 24 hour management of active medical problems listed below. Physiatrist and rehab team continue to assess barriers to discharge/monitor patient progress toward functional and medical goals. FIM: FIM - Bathing Bathing Steps Patient Completed: Chest;Right Arm;Left Arm;Abdomen;Front perineal area;Buttocks;Right upper leg;Left upper leg;Right lower leg (including foot);Left lower leg (including foot) Bathing: 5: Supervision: Safety issues/verbal cues  FIM - Upper Body Dressing/Undressing Upper body dressing/undressing steps patient completed: Thread/unthread right bra strap;Thread/unthread left bra strap;Hook/unhook bra;Thread/unthread right sleeve of pullover shirt/dresss;Thread/unthread left sleeve of pullover shirt/dress;Put head through opening of pull over shirt/dress;Pull shirt over trunk Upper body dressing/undressing: 5: Supervision: Safety issues/verbal cues FIM - Lower Body Dressing/Undressing Lower body dressing/undressing steps patient completed: Thread/unthread right pants leg;Thread/unthread left pants leg;Pull pants up/down;Don/Doff right sock;Don/Doff left sock;Thread/unthread left underwear leg;Thread/unthread right underwear leg;Pull underwear up/down Lower body dressing/undressing: 5: Supervision: Safety issues/verbal cues  FIM - Toileting Toileting steps completed by patient: Performs perineal hygiene;Adjust clothing prior to toileting;Adjust clothing after toileting Toileting Assistive Devices: Grab bar or rail for support Toileting: 5: Supervision: Safety issues/verbal cues  FIM - Diplomatic Services operational officer Devices: Grab bars Toilet Transfers: 5-From toilet/BSC: Supervision (verbal cues/safety issues);5-To  toilet/BSC: Supervision (verbal cues/safety issues)  FIM - Banker Devices: Manufacturing systems engineer Transfer: 4: Chair or W/C > Bed: Min A (steadying Pt. > 75%);4: Bed > Chair or W/C: Min  A (steadying Pt. > 75%)  FIM - Locomotion: Wheelchair Locomotion: Wheelchair: 0: Activity did not occur FIM - Locomotion: Ambulation Locomotion: Ambulation Assistive Devices: Designer, industrial/productWalker - Rolling Ambulation/Gait Assistance: 4: Min guard Locomotion: Ambulation: 2: Travels 50 - 149 ft with minimal assistance (Pt.>75%)  Comprehension Comprehension Mode: Auditory Comprehension: 6-Follows complex conversation/direction: With extra time/assistive device  Expression Expression Mode: Verbal Expression: 6-Expresses complex ideas: With extra time/assistive device  Social Interaction Social Interaction: 6-Interacts appropriately with others with medication or extra time (anti-anxiety, antidepressant).  Problem Solving Problem Solving: 6-Solves complex problems: With extra time  Memory Memory: 5-Recognizes or recalls 90% of the time/requires cueing < 10% of the time  Medical Problem List and Plan:  1. Functional deficits secondary to Bacterial meningitis with decreased balance and LE strength.  2. DVT Prophylaxis/Anticoagulation: Pharmaceutical: Lovenox  3. Pain Management: Tylenol prn pain.  4. Mood: Will continue Lexapro. LCSW to follow for evaluation and support.  5. Neuropsych: This patient is capable of making decisions on her own behalf.  6. Skin/Wound Care: Routine pressure measures. Maintain adequate nutrition and hydration status.  7. Fluids/Electrolytes/Nutrition: Monitor I/O. Continue ensure supplements bid.   8. Sepsis due to bacterial meningitis: IV ceftriaxone to continue thorough today 9. HSV stomatitis: valtrex completed   10. HTN: Monitor BP every 8 hours. Continue   coreg. Lisinopril held--no need to restart yet 11. Elevated troponin: Negatie Myoview study  Aug 2014. Follow up study after discharge.  12. Hyponatermia: labs improved 13. Constipation:    -maintenance medications to continue  LOS (Days) 4 A FACE TO FACE EVALUATION WAS PERFORMED  SWARTZ,ZACHARY T 02/02/2014 9:36 AM

## 2014-02-02 NOTE — Plan of Care (Signed)
Problem: RH SKIN INTEGRITY Goal: RH STG SKIN FREE OF INFECTION/BREAKDOWN Skin free of infection/breakdown with min assistance  Outcome: Progressing Valtrex course complete for ulcerations on lips. Areas crusted over.

## 2014-02-03 ENCOUNTER — Inpatient Hospital Stay (HOSPITAL_COMMUNITY): Payer: Federal, State, Local not specified - PPO | Admitting: Occupational Therapy

## 2014-02-03 NOTE — Progress Notes (Signed)
Occupational Therapy Session Note  Patient Details  Name: [name] [name] MRN: [id number] Date of Birth: [birth year]  Today's Date: 02/03/2014 OT Individual Time: 1345-1500 OT Individual Time Calculation (min): 75 min     Skilled Therapeutic Interventions/Progress Updates: Patient not scheduled for shower but asked this clinician to assist her with one.   She completed the shower and transfer via tub transfer bench, RW, long handled hose with close S and with extra time due to c/o fatigue.   She also completed toileting with overall distant S via grab bars and walker for support.  She dressed with distant S as well.   Her sister who plans to help her upon d/c was present for the session until [name] went to shower.   Focus was on endurance and lower body dressing.   Her BP sitting EOB per autommatic BP monitor was 131/66 (as compared to 128/41 around the same therapy time on yesterday).    Patient stated she was not light headed today as compared to yesterday.    Therapy Documentation Precautions:  Precautions Precautions: Fall Restrictions Weight Bearing Restrictions: No    Vital Signs: 131/66 sitting EOB with automated BP monitor   Pain: stated her low back hurt today for the first time (intermittent) but did not voice need for pain meds.,  She did not rate her pain level   See FIM for current functional status  Therapy/Group: Individual Therapy  Bud Face [facility] 02/03/2014, 3:29 PM

## 2014-02-03 NOTE — Progress Notes (Signed)
[place] PHYSICAL MEDICINE & REHABILITATION     PROGRESS NOTE    Subjective/Complaints: No new issues. Sleeping well  Objective: Vital Signs: Blood pressure 151/62, pulse 65, temperature 98.6 F (37 C), temperature source Oral, resp. rate 18, height 5\' 6"  (1.676 m), weight 106.414 kg (234 lb 9.6 oz), SpO2 98.00%. No results found. No results found for this basename: WBC, HGB, HCT, PLT,  in the last 72 hours No results found for this basename: NA, K, CL, CO, GLUCOSE, BUN, CREATININE, CALCIUM,  in the last 72 hours CBG (last 3)  No results found for this basename: GLUCAP,  in the last 72 hours  Wt Readings from Last 3 Encounters:  01/30/14 106.414 kg (234 lb 9.6 oz)  01/27/14 108.5 kg (239 lb 3.2 oz)  10/17/13 107.956 kg (238 lb)    Physical Exam:   Constitutional: She is oriented to person, place, and time. She appears well-developed and well-nourished. Multiple scabbed herpetic lesions on lips have almost resolved HENT: oral mucosa pink and moist  Head: Normocephalic and atraumatic.  Eyes: Conjunctivae are normal. Pupils are equal, round, and reactive to light.  Neck: Normal range of motion. Neck supple.  Cardiovascular: Normal rate and regular rhythm.  Respiratory: Effort normal and breath sounds normal. No respiratory distress. She has no wheezes.  GI: Soft. Bowel sounds are normal. She exhibits no distension. There is no tenderness.  Musculoskeletal: She exhibits no edema and no tenderness.  Neurological: She is alert and oriented to person, place, and time.  Speech normal.  Improved insight and awareness.  motor strength is 4/5 bilateral deltoid, biceps, triceps, grip. 3 minus bilateral hip flexors, 3 knee extensors ankle dorsiflexors and plantar flexors  Sensation intact to light touch bilateral upper and lower limbs  Deep tendon reflexes 3+ bilateral biceps, triceps, brachioradialis, patellar, 1+ at the ankles  Skin: Skin is warm and dry.   Assessment/Plan: 1.  Functional deficits secondary to bacterial meningitis which require 3+ hours per day of interdisciplinary therapy in a comprehensive inpatient rehab setting. Physiatrist is providing close team supervision and 24 hour management of active medical problems listed below. Physiatrist and rehab team continue to assess barriers to discharge/monitor patient progress toward functional and medical goals. FIM: FIM - Bathing Bathing Steps Patient Completed: Chest;Right Arm;Left Arm;Abdomen;Front perineal area;Buttocks;Right upper leg;Left upper leg;Right lower leg (including foot);Left lower leg (including foot) Bathing: 5: Supervision: Safety issues/verbal cues  FIM - Upper Body Dressing/Undressing Upper body dressing/undressing steps patient completed: Thread/unthread right bra strap;Thread/unthread left bra strap;Hook/unhook bra;Thread/unthread right sleeve of pullover shirt/dresss;Thread/unthread left sleeve of pullover shirt/dress;Put head through opening of pull over shirt/dress;Pull shirt over trunk Upper body dressing/undressing: 5: Supervision: Safety issues/verbal cues FIM - Lower Body Dressing/Undressing Lower body dressing/undressing steps patient completed: Thread/unthread right pants leg;Thread/unthread left pants leg;Pull pants up/down;Don/Doff right sock;Don/Doff left sock;Thread/unthread left underwear leg;Thread/unthread right underwear leg;Pull underwear up/down Lower body dressing/undressing: 5: Supervision: Safety issues/verbal cues  FIM - Toileting Toileting steps completed by patient: Adjust clothing prior to toileting;Performs perineal hygiene;Adjust clothing after toileting Toileting Assistive Devices: Grab bar or rail for support Toileting: 5: Supervision: Safety issues/verbal cues  FIM - Diplomatic Services operational officerToilet Transfers Toilet Transfers Assistive Devices: Grab bars;Walker Toilet Transfers: 5-From toilet/BSC: Supervision (verbal cues/safety issues);5-To toilet/BSC: Supervision (verbal  cues/safety issues)  FIM - BankerBed/Chair Transfer Bed/Chair Transfer Assistive Devices: Therapist, occupationalWalker Bed/Chair Transfer: 5: Chair or W/C > Bed: Supervision (verbal cues/safety issues);5: Bed > Chair or W/C: Supervision (verbal cues/safety issues)  FIM - Locomotion: Wheelchair Locomotion: Wheelchair: 0: Activity  did not occur FIM - Locomotion: Ambulation Locomotion: Ambulation Assistive Devices: Walker - Rolling Ambulation/Gait Assistance: 5: Supervision;4: Min guard Locomotion: Ambulation: 1: Travels less than 50 ft with minimal assistance (Pt.>75%)  Comprehension Comprehension Mode: Auditory Comprehension: 6-Follows complex conversation/direction: With extra time/assistive device  Expression Expression Mode: Verbal Expression: 6-Expresses complex ideas: With extra time/assistive device  Social Interaction Social Interaction: 6-Interacts appropriately with others with medication or extra time (anti-anxiety, antidepressant).  Problem Solving Problem Solving: 6-Solves complex problems: With extra time  Memory Memory: 5-Recognizes or recalls 90% of the time/requires cueing < 10% of the time  Medical Problem List and Plan:  1. Functional deficits secondary to Bacterial meningitis with decreased balance and LE strength.  2. DVT Prophylaxis/Anticoagulation: Pharmaceutical: Lovenox  3. Pain Management: Tylenol prn pain.  4. Mood: Will continue Lexapro. LCSW to follow for evaluation and support.  5. Neuropsych: This patient is capable of making decisions on her own behalf.  6. Skin/Wound Care: Routine pressure measures. Maintain adequate nutrition and hydration status.  7. Fluids/Electrolytes/Nutrition: Monitor I/O. Continue ensure supplements bid.   8. Sepsis due to bacterial meningitis: IV ceftriaxone to continue thorough today 9. HSV stomatitis: valtrex completed   10. HTN: Monitor BP every 8 hours. Continue   coreg. Lisinopril held-will resume at 10mg  daily 11. Elevated troponin: Negatie  Myoview study Aug 2014. Follow up study after discharge.  12. Hyponatermia: labs improved 13. Constipation:    -maintenance medications to continue  LOS (Days) 5 A FACE TO FACE EVALUATION WAS PERFORMED  SWARTZ,ZACHARY T 02/03/2014 8:33 AM

## 2014-02-04 ENCOUNTER — Inpatient Hospital Stay (HOSPITAL_COMMUNITY): Payer: Federal, State, Local not specified - PPO

## 2014-02-04 DIAGNOSIS — R5381 Other malaise: Secondary | ICD-10-CM

## 2014-02-04 DIAGNOSIS — G009 Bacterial meningitis, unspecified: Secondary | ICD-10-CM

## 2014-02-04 MED ORDER — LISINOPRIL 10 MG PO TABS
10.0000 mg | ORAL_TABLET | Freq: Every day | ORAL | Status: DC
  Administered 2014-02-04 – 2014-02-09 (×6): 10 mg via ORAL
  Filled 2014-02-04 (×7): qty 1

## 2014-02-04 NOTE — Progress Notes (Signed)
[place] PHYSICAL MEDICINE & REHABILITATION     PROGRESS NOTE    Subjective/Complaints: Complains of some fullness behind eyes. No blurred or double vision. bp's up a little higher. Tired from all the visitors she had yesterday  Objective: Vital Signs: Blood pressure 125/52, pulse 72, temperature 98.9 F (37.2 C), temperature source Oral, resp. rate 20, height 5\' 6"  (1.676 m), weight 106.414 kg (234 lb 9.6 oz), SpO2 100.00%. No results found. No results found for this basename: WBC, HGB, HCT, PLT,  in the last 72 hours No results found for this basename: NA, K, CL, CO, GLUCOSE, BUN, CREATININE, CALCIUM,  in the last 72 hours CBG (last 3)  No results found for this basename: GLUCAP,  in the last 72 hours  Wt Readings from Last 3 Encounters:  01/30/14 106.414 kg (234 lb 9.6 oz)  01/27/14 108.5 kg (239 lb 3.2 oz)  10/17/13 107.956 kg (238 lb)    Physical Exam:   Constitutional: She is oriented to person, place, and time. She appears well-developed and well-nourished. Multiple scabbed herpetic lesions on lips have almost resolved HENT: oral mucosa pink and moist. ?ear wax Head: Normocephalic and atraumatic.  Eyes: Conjunctivae are normal. Pupils are equal, round, and reactive to light. Anicteric. Visual acuity intact Neck: Normal range of motion. Neck supple.  Cardiovascular: Normal rate and regular rhythm.  Respiratory: Effort normal and breath sounds normal. No respiratory distress. She has no wheezes.  GI: Soft. Bowel sounds are normal. She exhibits no distension. There is no tenderness.  Musculoskeletal: She exhibits no edema and no tenderness.  Neurological: She is alert and oriented to person, place, and time.  Speech normal.  Improved insight and awareness.  motor strength is 4/5 bilateral deltoid, biceps, triceps, grip. 3 minus bilateral hip flexors, 3 knee extensors ankle dorsiflexors and plantar flexors  Sensation intact to light touch bilateral upper and lower limbs   Deep tendon reflexes 3+ bilateral biceps, triceps, brachioradialis, patellar, 1+ at the ankles  Skin: Skin is warm and dry.   Assessment/Plan: 1. Functional deficits secondary to bacterial meningitis which require 3+ hours per day of interdisciplinary therapy in a comprehensive inpatient rehab setting. Physiatrist is providing close team supervision and 24 hour management of active medical problems listed below. Physiatrist and rehab team continue to assess barriers to discharge/monitor patient progress toward functional and medical goals. FIM: FIM - Bathing Bathing Steps Patient Completed: Chest;Right Arm;Left Arm;Abdomen;Front perineal area;Buttocks;Right upper leg;Left upper leg;Right lower leg (including foot);Left lower leg (including foot) Bathing: 5: Supervision: Safety issues/verbal cues  FIM - Upper Body Dressing/Undressing Upper body dressing/undressing steps patient completed: Thread/unthread right bra strap;Thread/unthread left bra strap;Hook/unhook bra;Thread/unthread right sleeve of pullover shirt/dresss;Thread/unthread left sleeve of pullover shirt/dress;Put head through opening of pull over shirt/dress;Pull shirt over trunk Upper body dressing/undressing: 5: Supervision: Safety issues/verbal cues FIM - Lower Body Dressing/Undressing Lower body dressing/undressing steps patient completed: Thread/unthread right pants leg;Thread/unthread left pants leg;Pull pants up/down;Don/Doff right sock;Don/Doff left sock;Thread/unthread left underwear leg;Thread/unthread right underwear leg;Pull underwear up/down Lower body dressing/undressing: 5: Supervision: Safety issues/verbal cues  FIM - Toileting Toileting steps completed by patient: Adjust clothing prior to toileting;Performs perineal hygiene;Adjust clothing after toileting Toileting Assistive Devices: Grab bar or rail for support Toileting: 5: Supervision: Safety issues/verbal cues  FIM - Scientist, research (physical sciences) Devices: Grab bars;Walker Toilet Transfers: 5-From toilet/BSC: Supervision (verbal cues/safety issues);5-To toilet/BSC: Supervision (verbal cues/safety issues)  FIM - Press photographer Assistive Devices: Manufacturing systems engineer Transfer: 5: Chair or W/C >  Bed: Supervision (verbal cues/safety issues);5: Bed > Chair or W/C: Supervision (verbal cues/safety issues)  FIM - Locomotion: Wheelchair Locomotion: Wheelchair: 0: Activity did not occur FIM - Locomotion: Ambulation Locomotion: Ambulation Assistive Devices: Designer, industrial/productWalker - Rolling Ambulation/Gait Assistance: 5: Supervision;4: Min guard Locomotion: Ambulation: 1: Travels less than 50 ft with minimal assistance (Pt.>75%)  Comprehension Comprehension Mode: Auditory Comprehension: 6-Follows complex conversation/direction: With extra time/assistive device  Expression Expression Mode: Verbal Expression: 6-Expresses complex ideas: With extra time/assistive device  Social Interaction Social Interaction: 6-Interacts appropriately with others with medication or extra time (anti-anxiety, antidepressant).  Problem Solving Problem Solving: 6-Solves complex problems: With extra time  Memory Memory: 5-Recognizes or recalls 90% of the time/requires cueing < 10% of the time  Medical Problem List and Plan:  1. Functional deficits secondary to Bacterial meningitis with decreased balance and LE strength.  2. DVT Prophylaxis/Anticoagulation: Pharmaceutical: Lovenox  3. Pain Management: Tylenol prn pain.  4. Mood: Will continue Lexapro. LCSW to follow for evaluation and support.  5. Neuropsych: This patient is capable of making decisions on her own behalf.  6. Skin/Wound Care: Routine pressure measures. Maintain adequate nutrition and hydration status.  7. Fluids/Electrolytes/Nutrition: Monitor I/O. Continue ensure supplements bid.   8. Sepsis due to bacterial meningitis: IV ceftriaxone completed. No oral abx course 9. HSV  stomatitis: valtrex completed   10. HTN: Monitor BP every 8 hours. Continue   coreg. Lisinopril resumed at 10mg  daily 11. Elevated troponin: Negative Myoview study Aug 2014. Follow up study after discharge.  12. Hyponatermia: labs improved 13. Constipation:    -maintenance medications to continue 14. Eye pressure--?borderline glaucoma---outpt follow up  LOS (Days) 6 A FACE TO FACE EVALUATION WAS PERFORMED  Deyci Gesell T 02/04/2014 8:12 AM

## 2014-02-04 NOTE — Progress Notes (Signed)
Occupational Therapy Session Note  Patient Details  Name: [name] [name] MRN: [id number] Date of Birth: 1951-02-20  Today's Date: 02/04/2014 OT Individual Time: 1100-1200 and 1400-1500 OT Individual Time Calculation (min): 60 min and 60 min     Short Term Goals: Week 1:  OT Short Term Goal 1 (Week 1): Short term goals= long term goals  Skilled Therapeutic Interventions/Progress Updates:    Session 1: Pt seen for ADL retraining with focus on functional mobility, dynamic standing balance, and activity tolerance. Pt received sitting in recliner chair. Ambulated throughout room and bathroom with RW at supervision to retrieve clothing items and complete bathing. Completed bathing at supervision with pt demonstrating good safety awareness (e.g placing towel at feet following shower, keeping socks donned). Completed dressing sitting EOB with increased time d/t rest breaks. Pt completed oral care at sink in standing at supervision level. Discussed home setup and completing "dry runs" of transfers to toilet and shower upon returning home to increase safety. Completed BUE strengthening exercises while seated with use of green theraband. Pt reporting some dizziness and BP 126/61 with HR 63. Pt reporting dizziness subsided within 1 min. Returned to exercises and no episodes of dizziness reported. Pt left sitting in recliner chair with all needs in reach.   Session 2: Pt seen for 1:1 OT session with focus on functional transfers, activity tolerance, safety awareness, and standing balance/tolerance. Pt received sitting in recliner. Ambulated to bathroom and completed toilet task at supervision level. Practiced tub transfer using TTB at supervision level with min cues for safety with RW. Engaged standing balance task in kitchen, reaching into cabinets out of BOS at supervision level. Provided pt with 3 items to recall before going to gift shop. Pt recalled 3/3 items approx 15 min later at gift shop. Pt propelled  self throughout gift shop with emphasis on activity tolerance and awareness. Engaged in standing balance/tolerance task of watering plants up to 8 min at close supervision level with pt side stepping to challenge balance at times. Pt returned to room and left sitting in recliner chair with all needs in reach.   Therapy Documentation Precautions:  Precautions Precautions: Fall Precaution Comments: HOH L ear since meningitis Restrictions Weight Bearing Restrictions: No General:   Vital Signs: Therapy Vitals Pulse Rate: 63 BP: 126/61 mmHg Patient Position (if appropriate): Sitting Pain: Pain Assessment Pain Assessment: No/denies pain Pain Score: 0-No pain  See FIM for current functional status  Therapy/Group: Individual Therapy  Daneil Dan 02/04/2014, 12:04 PM

## 2014-02-04 NOTE — Progress Notes (Signed)
Physical Therapy Weekly Progress Note  Patient Details  Name: [name] [name] MRN: [id number] Date of Birth: November 04, 1950  Beginning of progress report period: January 29, 2014 End of progress report period: February 04, 2014  Today's Date: 02/04/2014 PT Individual Time: 0800-0900 PT Individual Time Calculation (min): 60 min   Patient has met 0 of 8 long term goals.  Short term goals not set due to estimated length of stay.   Patient continues to demonstrate the following deficits: activity tolerance, balance, vestibular, strength,  and therefore will continue to benefit from skilled PT intervention to enhance overall performance with activity tolerance, balance, postural control, ability to compensate for deficits and functional use of  right upper extremity, right lower extremity, left upper extremity and left lower extremity.  See Patient's Care Plan for progression toward long term goals.  Patient progressing toward long term goals..  Continue plan of care. Pt's sister plans to stay with her,  at least at night, at d/c.  Skilled Therapeutic Interventions/Progress Updates:  Therapeutic activity: dressing on EO recliner with supervision, extra time; basic transfers with supervision, without cues. Toilet transfer as per FIM.  Pt continues to use wall grab bar, but does not have one at home.  Unsure if pt has a counter or some other surface she can use sit>< stand from toilet at home.  (see note from [employer], PT for brief vestibular eval). neuromuscular re-education for gaze stabilization: in sitting, reading eye chart aloud while moving head horizontally and vertically, with report of slight blurriness, but 100% accurate on top 7 lines at arm's length.  Gait with Rw in room and in PT area, x 20' and x 36' with supervision.  Velocity quite slow: 1.1 '/second. Down/up 8 steps sideways in stair well, using both hands on L rail descending, R ascending, with min assist.  Pt very anxious and  tearful when faced with flight down.  Emotional support provided, and option of not doing steps offered, but pt wanted to attempt them.  Pt L knee buckled slightly on first step ascending, recovered independently.    Upon return to room, pt transferred to recliner; all needs within reach.      Therapy Documentation Precautions:  Precautions Precautions: Fall Precaution Comments: HOH L ear since meningitis Restrictions Weight Bearing Restrictions: No   Vital Signs: Therapy Vitals Pulse Rate: 71 BP: 142/70 mmHg Patient Position (if appropriate): Sitting after w/c propulsion Pain:- none reported     Locomotion : Ambulation Ambulation/Gait Assistance: 5: Supervision    See FIM for current functional status Therapy/Group: Individual Therapy  Danesha Kirchoff 02/04/2014, 1:35 PM

## 2014-02-04 NOTE — Consult Note (Signed)
INITIAL DIAGNOSTIC EVALUATION - [facility]   MEDICAL NECESSITY:  [name] [name] was seen on the [facility] Unit for an initial diagnostic evaluation owing to the patient's diagnosis of bacterial meningitis.   According to medical records, Mrs. Jeangilles was admitted to the rehab unit owing to "Functional deficits secondary to bacterial meningitis."  She has a history of.  "HTN, depression, anxiety disorder who was admitted on 01/18/14 with lethargy, fever 101.3 with leucocytosis and elevated troponin levels. She was started on IV antibiotics for SIRS and blood cultures done positive for strep Pneumo. LP with elevated protein >600 and WBC 11321. She was started on steroids as well as Vanc, Ceftriaxone and ampicillin for bacterial meningitis. CT head with slight asymmetry of cerebellar peduncles and Dr. Roseanne Reno recommended MRI brain for work up. MRI brain with diffusion abnormality around atrial of lateral ventricles and anterior to both frontal horns suggestive of meningitis and ventriculitis. ID consulted for input and recommends 14 days of IV ceftriaxone for treatment of Meningitis, ventriculitis and pneumococcal bacteremia. 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 Cardiology feels that patient with demand ischemia rather than NSTEMI.Follow up MRI brain done 09/28 with slight improvement in scattered restriction foci and no acute infarct or abscess."  During today's visit, Mrs. Raysor reported experiencing mild memory loss prior to suffering bacterial meningitis. She feels that these symptoms have since worsened. No other cognitive difficulties endorsed. From an emotional standpoint, Mrs. Galliano reported being treated for well-controlled depression via an antidepressant. She described her current situation as "depressing" but does not feel that her mood symptoms are above what would be considered normal for the situation. I concur.  No adjustment issues endorsed. Suicidal/homicidal ideation, plan or intent was denied. No manic or hypomanic episodes were reported. The patient denied ever experiencing any auditory/visual hallucinations. No major behavioral or personality changes were endorsed.   In regard to therapy, Mrs. Ederer feels that she has been making progress and she described the rehab staff as "wonderful." No barriers to therapy identified except that she exhibits a lack of confidence in her abilities that seems to be causing her to not want to discharge home yet. She has an abundance of family and friend to support her throughout this endeavor.   PROCEDURES ADMINISTERED: [1 unit [id number] on 02/04/14] Diagnostic clinical interview  Review of available records  Behavioral Evaluation: Mrs. Lanius was appropriately dressed for season and situation, and she appeared tidy and well-groomed. Normal posture was noted. She was friendly and rapport was easily established. Her speech was as expected and she was able to express ideas effectively. Her affect was appropriately modulated. Attention and motivation were good.    SUMMARY & IMPRESSION: Overall, Mrs. Demario endorsed suffering from mild memory troubles. No major mood symptoms reported except for normal depressive symptoms in reaction to an unfortunate situation, as well as a loss in confidence about her physical abilities. Overall, Mrs. Limback depression appears to be well controlled via her antidepressant.   I spent time providing psychoeducation and support, as well as discussing possibly undergoing neuropsychological assessment as an outpatient. Time was also spent reinforcing to the patient that her care providers would not discharge her home until she is ready (or without the necessary resources). Staff may wish to follow suit. At this time, no formal follow-up required unless requested by the staff or patient. Patient plans to contact us if she wishes her memory to be  evaluated upon discharge.    RECOMMENDATIONS  Continued follow-up regarding her antidepressant.    Patient will contact me if she wishes to undergo cognitive testing for perceived memory loss.   DIAGNOSES:  Bacterial meningitis Memory lapses or loss    Debbe MountsAdam T. Katerina Zurn, Psy.D.  Clinical Neuropsychologist  Rehabilitation psychologist

## 2014-02-05 ENCOUNTER — Inpatient Hospital Stay (HOSPITAL_COMMUNITY): Payer: Federal, State, Local not specified - PPO

## 2014-02-05 ENCOUNTER — Inpatient Hospital Stay (HOSPITAL_COMMUNITY): Payer: Federal, State, Local not specified - PPO | Admitting: *Deleted

## 2014-02-05 ENCOUNTER — Inpatient Hospital Stay (HOSPITAL_COMMUNITY): Payer: Federal, State, Local not specified - PPO | Admitting: Occupational Therapy

## 2014-02-05 NOTE — Progress Notes (Signed)
Physical Therapy Session Note  Patient Details  Name: [name] [name] MRN: [id number] Date of Birth: [birth year]  Today's Date: 02/05/2014 PT Individual Time:  13:00-14:00 ( )     Skilled Therapeutic Interventions/Progress Updates:  Tx focused on Berg balance test, gait with RW, visual acuity testing, and balance training.  Pt up in recliner, agreeable to PT. Sister arrived end of tx, and observed pt on stairs and gait; planning to attend family training 1 day prior to DC.   Gait training in controlled setting with RW 2x150' with S only and cues for posture. Stair training x5 with 1 rail (R ascending, L descending) sidestepping in step-to-pattern. Pt needed only close S, and had no anxiety in this condition of just a few steps. Pt declined attempting flight today, reporting being too fatigued.   Administered Berg balance assessment; see score below. Educated pt on increased risk of falls, functional implications, and targeted goals of testing. Pt had most difficulty with dynamic movements to L side, as well as tasks with narrow BOS and SLS. Pt very steady in static conditions. Pt will benefit from continued dynamic balance challenges.  Dynamic gait and balance performed to challenge pt's functional balance including HHA for forward walking, retro walking, and side-stepping (at rail) x25' each. Pt had most difficulty with retro walking, needing Min A for 1 LOB when step too long.   Performed neuromuscular re-education for gaze stabilization: in sitting and standing, reading eye chart aloud while moving head horizontally and vertically, with report of slight blurriness, but 100% accurate on top 7 lines at arm's length.   Pt left up in recliner with all needs in reach.        Therapy Documentation Precautions:  Precautions Precautions: Fall Precaution Comments: HOH L ear since meningitis Restrictions Weight Bearing Restrictions: No Pain: Pain Assessment Pain Assessment: No/denies  pain  Balance: Berg Balance Test Sit to Stand: Able to stand  independently using hands Standing Unsupported: Able to stand 2 minutes with supervision Sitting with Back Unsupported but Feet Supported on Floor or Stool: Able to sit safely and securely 2 minutes Stand to Sit: Controls descent by using hands Transfers: Able to transfer safely, definite need of hands Standing Unsupported with Eyes Closed: Able to stand 10 seconds safely Standing Ubsupported with Feet Together: Able to place feet together independently and stand for 1 minute with supervision From Standing, Reach Forward with Outstretched Arm: Can reach confidently >25 cm (10") From Standing Position, Pick up Object from Floor: Unable to try/needs assist to keep balance From Standing Position, Turn to Look Behind Over each Shoulder: Turn sideways only but maintains balance Turn 360 Degrees: Needs close supervision or verbal cueing Standing Unsupported, Alternately Place Feet on Step/Stool: Needs assistance to keep from falling or unable to try Standing Unsupported, One Foot in Front: Able to take small step independently and hold 30 seconds Standing on One Leg: Tries to lift leg/unable to hold 3 seconds but remains standing independently Total Score: 33  See FIM for current functional status  Therapy/Group: Individual Therapy Clydene Laming, PT, DPT  02/05/2014, 1:33 PM

## 2014-02-05 NOTE — Progress Notes (Signed)
Occupational Therapy Session Note  Patient Details  Name: [name] [name] MRN: [id number] Date of Birth: June 24, 1950  Today's Date: 02/05/2014 OT Individual Time: 0900-1000 OT Individual Time Calculation (min): 60 min    Short Term Goals: Week 1:  OT Short Term Goal 1 (Week 1): Short term goals= long term goals  Skilled Therapeutic Interventions/Progress Updates:    Pt amb with RW from room to ADL apartment.  Pt engaged in cooking scrambled eggs including gathering supplies and cleaning up.  Pt completed all tasks at supervision level.  Pt amb with RW in bedroom to complete home mgmt tasks. Pt educated on RW safety in kitchen. Pt required appropriate rest breaks throughout session.  Continued discharge planning and recommendations.  Pt stated she is anxious about going home because she doesn't want to be a burden on family members.    Therapy Documentation Precautions:  Precautions Precautions: Fall Precaution Comments: HOH L ear since meningitis Restrictions Weight Bearing Restrictions: No   Pain: Pain Assessment Pain Assessment: No/denies pain Pain Score: 0-No pain  See FIM for current functional status  Therapy/Group: Individual Therapy  Rich Brave 02/05/2014, 10:05 AM

## 2014-02-05 NOTE — Progress Notes (Signed)
[facility]     PROGRESS NOTE    Subjective/Complaints: Had a great day with therapy. Still some wax in ears. Slept well last night.  Objective: Vital Signs: Blood pressure 139/61, pulse 78, temperature 98.6 F (37 C), temperature source Oral, resp. rate 18, height 5\' 6"  (1.676 m), weight 106.414 kg (234 lb 9.6 oz), SpO2 98.00%. No results found. No results found for this basename: WBC, HGB, HCT, PLT,  in the last 72 hours No results found for this basename: NA, K, CL, CO, GLUCOSE, BUN, CREATININE, CALCIUM,  in the last 72 hours CBG (last 3)  No results found for this basename: GLUCAP,  in the last 72 hours  Wt Readings from Last 3 Encounters:  01/30/14 106.414 kg (234 lb 9.6 oz)  01/27/14 108.5 kg (239 lb 3.2 oz)  10/17/13 107.956 kg (238 lb)    Physical Exam:   Constitutional: She is oriented to person, place, and time. She appears well-developed and well-nourished. Multiple scabbed herpetic lesions on lips have almost resolved HENT: oral mucosa pink and moist. ?ear wax Head: Normocephalic and atraumatic.  Eyes: Conjunctivae are normal. Pupils are equal, round, and reactive to light. Anicteric. Visual acuity intact Neck: Normal range of motion. Neck supple.  Cardiovascular: Normal rate and regular rhythm.  Respiratory: Effort normal and breath sounds normal. No respiratory distress. She has no wheezes.  GI: Soft. Bowel sounds are normal. She exhibits no distension. There is no tenderness.  Musculoskeletal: She exhibits no edema and no tenderness.  Neurological: She is alert and oriented to person, place, and time.  Speech normal.     motor strength is 4/5 bilateral deltoid, biceps, triceps, grip. 4 minus bilateral hip flexors, 4 to 4+ knee extensors ankle dorsiflexors and plantar flexors  Sensation intact to light touch bilateral upper and lower limbs  Deep tendon reflexes 2+ bilateral biceps, triceps, brachioradialis, patellar, 1+ at the  ankles  Skin: Skin is warm and dry.   Assessment/Plan: 1. Functional deficits secondary to bacterial meningitis which require 3+ hours per day of interdisciplinary therapy in a comprehensive inpatient rehab setting. Physiatrist is providing close team supervision and 24 hour management of active medical problems listed below. Physiatrist and rehab team continue to assess barriers to discharge/monitor patient progress toward functional and medical goals. FIM: FIM - Bathing Bathing Steps Patient Completed: Chest;Right Arm;Left Arm;Abdomen;Front perineal area;Buttocks;Right upper leg;Left upper leg;Right lower leg (including foot);Left lower leg (including foot) Bathing: 5: Supervision: Safety issues/verbal cues  FIM - Upper Body Dressing/Undressing Upper body dressing/undressing steps patient completed: Thread/unthread right bra strap;Thread/unthread left bra strap;Hook/unhook bra;Thread/unthread right sleeve of pullover shirt/dresss;Thread/unthread left sleeve of pullover shirt/dress;Put head through opening of pull over shirt/dress;Pull shirt over trunk Upper body dressing/undressing: 5: Supervision: Safety issues/verbal cues FIM - Lower Body Dressing/Undressing Lower body dressing/undressing steps patient completed: Thread/unthread right pants leg;Thread/unthread left pants leg;Pull pants up/down;Don/Doff right sock;Don/Doff left sock;Thread/unthread left underwear leg;Thread/unthread right underwear leg;Pull underwear up/down Lower body dressing/undressing: 5: Supervision: Safety issues/verbal cues  FIM - Toileting Toileting steps completed by patient: Adjust clothing prior to toileting;Performs perineal hygiene;Adjust clothing after toileting Toileting Assistive Devices: Grab bar or rail for support Toileting: 5: Supervision: Safety issues/verbal cues  FIM - Diplomatic Services operational officer Devices: Grab bars;Walker Toilet Transfers: 5-From toilet/BSC: Supervision (verbal  cues/safety issues);5-To toilet/BSC: Supervision (verbal cues/safety issues)  FIM - Bed/Chair Transfer Bed/Chair Transfer Assistive Devices: Manufacturing systems engineer Transfer: 5: Bed > Chair or W/C: Supervision (verbal cues/safety issues);5: Chair or W/C >  Bed: Supervision (verbal cues/safety issues)  FIM - Locomotion: Wheelchair Distance: 150 Locomotion: Wheelchair: 5: Travels 150 ft or more: maneuvers on rugs and over door sills with supervision, cueing or coaxing FIM - Locomotion: Ambulation Locomotion: Ambulation Assistive Devices: Designer, industrial/productWalker - Rolling Ambulation/Gait Assistance: 5: Supervision Locomotion: Ambulation: 2: Travels 50 - 149 ft with supervision/safety issues  Comprehension Comprehension Mode: Auditory Comprehension: 6-Follows complex conversation/direction: With extra time/assistive device  Expression Expression Mode: Verbal Expression: 6-Expresses complex ideas: With extra time/assistive device  Social Interaction Social Interaction: 6-Interacts appropriately with others with medication or extra time (anti-anxiety, antidepressant).  Problem Solving Problem Solving: 6-Solves complex problems: With extra time  Memory Memory: 5-Recognizes or recalls 90% of the time/requires cueing < 10% of the time  Medical Problem List and Plan:  1. Functional deficits secondary to Bacterial meningitis with decreased balance and LE strength.  2. DVT Prophylaxis/Anticoagulation: Pharmaceutical: Lovenox  3. Pain Management: Tylenol prn pain.  4. Mood: Will continue Lexapro. LCSW to follow for evaluation and support.  5. Neuropsych: This patient is capable of making decisions on her own behalf.  6. Skin/Wound Care: Routine pressure measures. Maintain adequate nutrition and hydration status.  7. Fluids/Electrolytes/Nutrition: Monitor I/O. Continue ensure supplements bid.   8. Sepsis due to bacterial meningitis: IV ceftriaxone completed. No oral abx course recommended 9. HSV stomatitis: valtrex  completed   10. HTN: Monitor BP every 8 hours. Continue   coreg. Lisinopril resumed at 10mg  daily with some improvement 11. Elevated troponin: Negative Myoview study Aug 2014. Follow up study after discharge.  12. Hyponatermia: labs improved 13. Constipation:    -maintenance medications to continue 14. Eye pressure--?borderline glaucoma---outpt follow up  LOS (Days) 7 A FACE TO FACE EVALUATION WAS PERFORMED  Konstantin Lehnen T 02/05/2014 8:08 AM

## 2014-02-05 NOTE — Progress Notes (Signed)
Recreational Therapy Session Note  Patient Details  Name: TEQUIA WOLMAN MRN: [id number] Date of Birth: 1950/07/17 Today's Date: 02/05/2014  Pt with anticipated discharge date of 10/10.  Met with pt and discussed TR services.  No formal TR treatment plan implemented.  Will monitor through team for community reintegration as appropriate.  Aislynn Cifelli 02/05/2014, 4:53 PM

## 2014-02-05 NOTE — Progress Notes (Signed)
Occupational Therapy Session Note  Patient Details  Name: [name] [name] MRN: [id number] Date of Birth: 02-08-1951  Today's Date: 02/05/2014 OT Individual Time: 4944-9675 OT Individual Time Calculation (min): 60 min    Short Term Goals: Week 1:  OT Short Term Goal 1 (Week 1): Short term goals= long term goals  Skilled Therapeutic Interventions/Progress Updates:   Received Pt sitting in chair recliner and agreeable to treatment session consisting of ADL self-care tasks.  Ambulated using RW within room to complete toileting task and to shower bench for bathing task, as well as to retrieve clothing items before dressing task while seated at EOB.  Pt took her time with ambulation, transfers, and while engaging in tasks while demonstrating safety awareness at all times.  Pt c/o slight dizziness after completing ADL self-care tasks, encouraged Pt to take short rest breaks as needed and until dizziness was no longer present.  Participated in UB strengthening exercises using theraband while seated in chair recliner.  Discussed home environment upon d/c and addressed areas of concern regarding safety and with homemaking tasks.   Therapy Documentation Precautions:  Precautions Precautions: Fall Precaution Comments: HOH L ear since meningitis Restrictions Weight Bearing Restrictions: No General:   Vital Signs:   Pain: Pain Assessment Pain Assessment: No/denies pain Pain Score: 0-No pain ADL:    See FIM for current functional status  Therapy/Group: Individual Therapy  Rowe Warman 02/05/2014, 10:43 AM

## 2014-02-05 NOTE — Progress Notes (Signed)
I have reviewed and agree with the attached treatment note.  Jahni Paul, OTR/L 

## 2014-02-06 ENCOUNTER — Encounter (HOSPITAL_COMMUNITY): Payer: Federal, State, Local not specified - PPO | Admitting: Occupational Therapy

## 2014-02-06 ENCOUNTER — Inpatient Hospital Stay (HOSPITAL_COMMUNITY): Payer: Federal, State, Local not specified - PPO

## 2014-02-06 ENCOUNTER — Inpatient Hospital Stay (HOSPITAL_COMMUNITY): Payer: Federal, State, Local not specified - PPO | Admitting: Physical Therapy

## 2014-02-06 NOTE — Progress Notes (Signed)
Occupational Therapy Session Note  Patient Details  Name: [name] [name] MRN: [id number] Date of Birth: Sep 03, 1950  Today's Date: 02/06/2014 OT Individual Time: 2707-8675 OT Individual Time Calculation (min): 60 min    Short Term Goals: Week 1:  OT Short Term Goal 1 (Week 1): Short term goals= long term goals  Skilled Therapeutic Interventions/Progress Updates:  Pt supine in bed and awake when arriving. Pt reporting no pain but did state that she was very tired. Pt t/f to EOB, donned socks, and ambulated to toilet w/ RW. Pt voided, doffed clothing, and ambulated to TTB w/ RW. Pt performed UB/LB dressing, w/ 1 sit<>stand t/f. Pt gathered clothing using RW and sat on EOB to complete UB/LB dressing. Pt educated on safety w/ walker and to use walker bag to store objects instead of carrying them when ambulating w/ rolling walker. Pt ambulated to recliner and donned shoes and brushed hair. Pt then ambulated down hallway, requiring one rest break and complaining of light headedness, BP at 128/61 after sitting. Pt ambulated back to room using RW. Pt seated in recliner and set-up w/ breakfast and all needs nearby when leaving.   Therapy Documentation Precautions:  Precautions Precautions: Fall Precaution Comments: HOH L ear since meningitis Restrictions Weight Bearing Restrictions: No  See FIM for current functional status  Therapy/Group: Individual Therapy  Elanora Quin [name] 02/06/2014, 11:41 AM

## 2014-02-06 NOTE — Progress Notes (Signed)
Physical Therapy Session Note  Patient Details  Name: [name] [name] MRN: [id number] Date of Birth: [birth year]  Today's Date: 02/06/2014 PT Individual Time: 0905-1005 PT Individual Time Calculation (min): 60 min   Short Term Goals: Week 1:  PT Short Term Goal 1 (Week 1): = LTGs due to short LOS  Skilled Therapeutic Interventions/Progress Updates:  Gait in room with RW to/from sink.  Gait x 150' x 2 with supervision, cues for upright posture, forward gaze and decreased pressure on bil UEs.  Standing endurance at sink, leaning against it lgithly, x 5 minutes during oral car. Toilet transfer in room at end of session- see FIM. Retrieving items from floor from standing, x 8 with min guard assist, no LOB. Sit>< stand throughout session without use of hands, focusing on forward wt shifting.   neuromuscular re-education for balance strategies via forced use, demo, VCs for [place] A exs: calf raises with L or R arm raised, visually tracking hand while raising up, mini squats, toe raises- al 10 x 1 each, R and L hip abduction "nudging" BOSU ball x 10; marching in place . Visual gaze stabilization activity: reading cards at 3' away (no reading glasses) during vertical and horizontal head movements.  Constant cues to keep moving head, and c/o haziness of vision throughout. When stepping backwards x 2 step before sitting, LOB backwards, requiring assist to prevent fall 1/3 occurences.    Therapy Documentation Precautions:  Precautions Precautions: Fall Precaution Comments: HOH L ear since meningitis Restrictions Weight Bearing Restrictions: No Pain: Pain Assessment Pain Assessment: No/denies pain Pain Score: 0-No pain   Locomotion : Ambulation Ambulation/Gait Assistance: 5: Supervision     See FIM for current functional status  Therapy/Group: Individual Therapy  [name] [name] 02/06/2014, 10:16 AM

## 2014-02-06 NOTE — Progress Notes (Signed)
Physical Therapy Session Note  Patient Details  Name: [name] [name] MRN: [id number] Date of Birth: 1951/01/09  Today's Date: 02/06/2014 PT Individual Time: 1400-1500 PT Individual Time Calculation (min): 60 min   Short Term Goals: Week 1:  PT Short Term Goal 1 (Week 1): = LTGs due to short LOS  Skilled Therapeutic Interventions/Progress Updates:    Pt received seated in recliner; agreeable to therapy. Session focused on adaptation to peripheral vestibular impairments, increasing gait stability. Pt performed functional ambulation 150' x2 in controlled environment with rolling walker and supervision. With encouragement, pt performed gait x30' in controlled environment with L HHA demonstrating limited lateral weight shift to L side, decreased RLE step length. Pt with increased anxiety performing functional mobility without rolling walker. Provided education on the importance of removing somatosensory input (rolling walker, noncompliant surfaces) during therapies to increase reliance on vestibular input. Explained the progression from using compensatory strategies for safety to then challenging vestibular system while on rehab unit. Pt verbalized understanding. See below for detailed description of NMR interventions. Ambulated to/from bathroom with rolling walker and supervision, cueing to maintain walker in contact with ground during turning. Toilet transfer with rolling walker and supervision. Departed with pt seated in recliner with all needs within reach.  Therapy Documentation Precautions:  Precautions Precautions: Fall Precaution Comments: HOH L ear since meningitis Restrictions Weight Bearing Restrictions: No General:   Vital Signs: Therapy Vitals Temp: 98.8 F (37.1 C) Temp Source: Oral Pulse Rate: 80 Resp: 17 BP: 110/62 mmHg Patient Position (if appropriate): Sitting Oxygen Therapy SpO2: 100 % O2 Device: None (Room air) Pain:  Pt reports no pain during PT  session. Locomotion : Ambulation Ambulation/Gait Assistance: 4: Min assist;5: Supervision  Trunk/Postural Assessment : Postural Control Righting Reactions: With posterior LOB, ankle strategy intact but no presence of hip or stepping strategies. With anterior LOB, ankle and stepping strategies intact, but no presence of hip strategy.  NMR: Focused on decreasing visual and somatosensory input to increase reliance on vestibular input. At parallel bars, pt performed multiple trials of static standing on Airex foam with eyes closed without UE support with close supervision to min guard. Longest trial: x32 seconds prior to LOB to L side. Also performed semi tandem on Airex foam without UE support >30 seconds with close supervision with eyes open.  See FIM for current functional status  Therapy/Group: Individual Therapy  [name] [name], [name] [name] 02/06/2014, 7:02 PM

## 2014-02-06 NOTE — Progress Notes (Signed)
Social Work Patient ID: [name] [name], female   DOB: Feb 23, 1951, 63 y.o.   MRN: [id number]  CSW met with pt and her sister, Baker Janus, and her cousin from [place] to update them on team conference discussion.  Pt's d/c date was set for Saturday with family education to occur on Friday.  Pt's sister asked a lot of questions about DME and follow up care needs.  CSW and PT both explained that pt is not going to need 24/7 supervision.  She will need supervision with car transfers and stairs, but can otherwise be modified independent at time of d/c.  CSW will work on getting pt necessary DME and f/u therapies.  CSW will also ask OT to tell sister where grab bars are recommended in the bathrooms and will alert team to family education schedule.

## 2014-02-06 NOTE — Progress Notes (Signed)
[facility]ABILIT[facility]     PROGRESS NOTE    Subjective/Complaints: Had a busy day. Has more confidence in her abilities.  Objective: Vital Signs: Blood pressure 140/69, pulse 68, temperature 98.3 F (36.8 C), temperature source Oral, resp. rate 18, height 5\' 6"  (1.676 m), weight 106.414 kg (234 lb 9.6 oz), SpO2 100.00%. No results found. No results found for this basename: WBC, HGB, HCT, PLT,  in the last 72 hours No results found for this basename: NA, K, CL, CO, GLUCOSE, BUN, CREATININE, CALCIUM,  in the last 72 hours CBG (last 3)  No results found for this basename: GLUCAP,  in the last 72 hours  Wt Readings from Last 3 Encounters:  01/30/14 106.414 kg (234 lb 9.6 oz)  01/27/14 108.5 kg (239 lb 3.2 oz)  10/17/13 107.956 kg (238 lb)    Physical Exam:   Constitutional: She is oriented to person, place, and time. She appears well-developed and well-nourished. Multiple scabbed herpetic lesions on lips have almost resolved HENT: oral mucosa pink and moist. Lesions on lips resolving Head: Normocephalic and atraumatic.  Eyes: Conjunctivae are normal. Pupils are equal, round, and reactive to light. Anicteric. Visual acuity intact Neck: Normal range of motion. Neck supple.  Cardiovascular: Normal rate and regular rhythm.  Respiratory: Effort normal and breath sounds normal. No respiratory distress. She has no wheezes.  GI: Soft. Bowel sounds are normal. She exhibits no distension. There is no tenderness.  Musculoskeletal: She exhibits no edema and no tenderness.  Neurological: She is alert and oriented to person, place, and time.  Speech normal.     motor strength is 4/5 bilateral deltoid, biceps, triceps, grip. 4 minus bilateral hip flexors, 4 to 4+ knee extensors ankle dorsiflexors and plantar flexors  Sensation intact to light touch bilateral upper and lower limbs  Deep tendon reflexes 2+ bilateral biceps, triceps, brachioradialis, patellar, 1+ at the  ankles  Skin: Skin is warm and dry.   Assessment/Plan: 1. Functional deficits secondary to bacterial meningitis which require 3+ hours per day of interdisciplinary therapy in a comprehensive inpatient rehab setting. Physiatrist is providing close team supervision and 24 hour management of active medical problems listed below. Physiatrist and rehab team continue to assess barriers to discharge/monitor patient progress toward functional and medical goals.  FIM: FIM - Bathing Bathing Steps Patient Completed: Chest;Right Arm;Left Arm;Abdomen;Front perineal area;Buttocks;Right upper leg;Left upper leg;Right lower leg (including foot);Left lower leg (including foot) Bathing: 5: Supervision: Safety issues/verbal cues  FIM - Upper Body Dressing/Undressing Upper body dressing/undressing steps patient completed: Thread/unthread right bra strap;Thread/unthread left bra strap;Hook/unhook bra;Thread/unthread right sleeve of pullover shirt/dresss;Thread/unthread left sleeve of pullover shirt/dress;Put head through opening of pull over shirt/dress;Pull shirt over trunk Upper body dressing/undressing: 5: Supervision: Safety issues/verbal cues FIM - Lower Body Dressing/Undressing Lower body dressing/undressing steps patient completed: Thread/unthread right underwear leg;Thread/unthread left underwear leg;Pull underwear up/down;Thread/unthread right pants leg;Thread/unthread left pants leg;Pull pants up/down;Don/Doff right sock;Don/Doff left sock;Don/Doff right shoe;Don/Doff left shoe;Fasten/unfasten right shoe;Fasten/unfasten left shoe Lower body dressing/undressing: 5: Supervision: Safety issues/verbal cues  FIM - Toileting Toileting steps completed by patient: Adjust clothing prior to toileting;Performs perineal hygiene;Adjust clothing after toileting Toileting Assistive Devices: Grab bar or rail for support Toileting: 6: Assistive device: No helper  FIM - Diplomatic Services operational officer  Devices: Grab bars;Walker Toilet Transfers: 5-To toilet/BSC: Supervision (verbal cues/safety issues);5-From toilet/BSC: Supervision (verbal cues/safety issues)  FIM - Banker Devices: Walker;Arm rests Bed/Chair Transfer: 5: Bed > Chair or W/C: Supervision (  verbal cues/safety issues);5: Chair or W/C > Bed: Supervision (verbal cues/safety issues)  FIM - Locomotion: Wheelchair Distance: 150 Locomotion: Wheelchair: 0: Activity did not occur FIM - Locomotion: Ambulation Locomotion: Ambulation Assistive Devices: Designer, industrial/productWalker - Rolling Ambulation/Gait Assistance: 5: Supervision Locomotion: Ambulation: 5: Travels 150 ft or more with supervision/safety issues  Comprehension Comprehension Mode: Auditory Comprehension: 6-Follows complex conversation/direction: With extra time/assistive device  Expression Expression Mode: Verbal Expression: 6-Expresses complex ideas: With extra time/assistive device  Social Interaction Social Interaction: 6-Interacts appropriately with others with medication or extra time (anti-anxiety, antidepressant).  Problem Solving Problem Solving: 6-Solves complex problems: With extra time  Memory Memory: 5-Recognizes or recalls 90% of the time/requires cueing < 10% of the time  Medical Problem List and Plan:  1. Functional deficits secondary to Bacterial meningitis with decreased balance and LE strength.  2. DVT Prophylaxis/Anticoagulation: Pharmaceutical: Lovenox  3. Pain Management: Tylenol prn pain.  4. Mood: Will continue Lexapro. LCSW to follow for evaluation and support.  5. Neuropsych: This patient is capable of making decisions on her own behalf.  6. Skin/Wound Care: Routine pressure measures. Maintain adequate nutrition and hydration status.  7. Fluids/Electrolytes/Nutrition: Monitor I/O. Continue ensure supplements bid.   8. Sepsis due to bacterial meningitis: IV ceftriaxone completed. No oral abx course recommended 9.  HSV stomatitis: valtrex completed   10. HTN: Monitor BP every 8 hours. Continue   coreg. Lisinopril resumed at 10mg  daily with improvement 11. Elevated troponin: Negative Myoview study Aug 2014. Follow up study after discharge.  12. Hyponatermia: labs improved 13. Constipation:    -maintenance medications to continue 14. Eye pressure--?borderline glaucoma---outpt follow up  -symptoms seem to have improved  LOS (Days) 8 A FACE TO FACE EVALUATION WAS PERFORMED  SWARTZ,ZACHARY T 02/06/2014 9:03 AM

## 2014-02-06 NOTE — Progress Notes (Signed)
Note reviewed and accurately reflects treatment session.   

## 2014-02-06 NOTE — Patient Care Conference (Signed)
Inpatient RehabilitationTeam Conference and Plan of Care Update Date: 02/05/2014   Time: 2:00 PM    Patient Name: [name] [name]      Medical Record Number: [id number]  Date of Birth: [birth year] Sex: Female         Room/Bed: 4W21C/4W21C-01 Payor Info: Payor: BLUE [employer] BLUE [employer]IELD / Plan: [employer] PPO / Product Type: *No Product type* /    Admitting Diagnosis: Bacterial meningitis  Admit Date/Time:  01/29/2014  4:32 PM Admission Comments: No comment available   Primary Diagnosis:  <principal problem not specified> Principal Problem: <principal problem not specified>  Patient Active Problem List   Diagnosis Date Noted  . Debility 02/01/2014  . Meningitis due to bacteria 01/29/2014  . Demand myocardial ischemic related infarction 01/23/2014    Class: Diagnosis of  . Sepsis 01/18/2014  . Altered mental status 01/18/2014  . Depression, reactive 10/12/2012  . Chronic neutropenia 04/14/2012  . Hyperlipemia 10/20/2011  . Anemia 10/20/2011  . HTN (hypertension) 10/20/2011  . BMI 32.0-32.9,adult 10/20/2011    Expected Discharge Date: Expected Discharge Date: 02/09/14  Team Members Present: Physician leading conference: Dr. Faith RogueZachary Swartz Social Worker Present: Staci AcostaJenny Keziah Drotar, LCSW Nurse Present: Keturah BarreEd Knisley, RN PT Present: Edman CircleAudra Hall, PT OT Present: Donzetta KohutFrank Barthold, OT SLP Present: Feliberto Gottronourtney [name], SLP PPS Coordinator present : Tora DuckMarie Noel, RN, CRRN     Current Status/Progress Goal Weekly Team Focus  Medical   bacterial meningitis. weakness. cognition improved. some anxiety  stabilize medically for dc  education, anxiety mgt   Bowel/Bladder   Continent of bowel and bladder; LBM 10/4  mod I  Continue current regimen   Swallow/Nutrition/ Hydration             ADL's   supervision   mod I  dynamic standing balance, functional mobility, functional transfers, activity tolerance, safety awareness, education   Mobility   supervision basic transfers, supervision gait with  RW x 90' and w/c x 150'; limited by decreased balance and lack of confidence  modified independent bed and basic transfer, gait 150' controlled and home env; supervision car transfer and up/down flight of stairs 1 railing  activity tolerance, vestibular/balance retraining, strengthening, pt and family ed, d/c planning   Communication             Safety/Cognition/ Behavioral Observations            Pain   No complains of pain  < 4  Assess and treat for pain q shift and prn   Skin   Blisters to lips-cold sore- scabbed over  mod I  Assess skin q shift and prn    Rehab Goals Patient on target to meet rehab goals: Yes Rehab Goals Revised: none *See Care Plan and progress notes for long and short-term goals.  Barriers to Discharge: anxiety, pt and family awareness    Possible Resolutions to Barriers:  pt and family ed. continued therapy---mod I goals    Discharge Planning/Teaching Needs:  Pt plans to return to her home with assistance from her sister.  Pt's sister, Dondra SpryGail, to receive family education on 02-08-14.  Pt will also be responsible for directing aspects of her care and assistance needed.   Team Discussion:  Pt is doing well medically and improving cognitively per MD.  She has completed her antibiotics.  Pt continues to have hearing impairment in her left ear.  PT reported that pt is making progress, but slowly.  PT has identified a vestibular problem and they are working with pt  for that.  Pt continues to work with PT on stairs, balance, and endurance.  OT is also working with pt on endurance and she is progressing toward her goals.  Revisions to Treatment Plan:  None   Continued Need for Acute Rehabilitation Level of Care: The patient requires daily medical management by a physician with specialized training in physical medicine and rehabilitation for the following conditions: Daily direction of a multidisciplinary physical rehabilitation program to ensure safe treatment while  eliciting the highest outcome that is of practical value to the patient.: Yes Daily medical management of patient stability for increased activity during participation in an intensive rehabilitation regime.: Yes Daily analysis of laboratory values and/or radiology reports with any subsequent need for medication adjustment of medical intervention for : Neurological problems;Other  Humbert Morozov, [place] 02/06/2014, 9:28 AM

## 2014-02-07 ENCOUNTER — Inpatient Hospital Stay (HOSPITAL_COMMUNITY): Payer: Federal, State, Local not specified - PPO

## 2014-02-07 ENCOUNTER — Encounter (HOSPITAL_COMMUNITY): Payer: Federal, State, Local not specified - PPO | Admitting: Occupational Therapy

## 2014-02-07 MED ORDER — CARVEDILOL 3.125 MG PO TABS: 3.1250 mg | ORAL_TABLET | Freq: Two times a day (BID) | ORAL | Status: DC

## 2014-02-07 MED ORDER — SENNA 8.6 MG PO TABS: 2.0000 | ORAL_TABLET | Freq: Every day | ORAL | Status: DC

## 2014-02-07 MED ORDER — ALUM & MAG HYDROXIDE-SIMETH 200-200-20 MG/5ML PO SUSP: 30.0000 mL | ORAL | Status: DC | PRN

## 2014-02-07 MED ORDER — ESCITALOPRAM OXALATE 10 MG PO TABS: 10.0000 mg | ORAL_TABLET | Freq: Every day | ORAL | Status: DC

## 2014-02-07 MED ORDER — LISINOPRIL 20 MG PO TABS: 10.0000 mg | ORAL_TABLET | Freq: Every day | ORAL | Status: DC

## 2014-02-07 NOTE — Progress Notes (Signed)
Occupational Therapy Session Note  Patient Details  Name: [name] [name] MRN: [id number] Date of Birth: 1950-06-05  Today's Date: 02/07/2014 OT Individual Time: 9323-5573 OT Individual Time Calculation (min): 60 min    Short Term Goals: Week 1:  OT Short Term Goal 1 (Week 1): Short term goals= long term goals  Skilled Therapeutic Interventions/Progress Updates:  Pt supine in bed and awake when arriving. Pt reporting no pain, but fatigue from little sleep during the night. Pt t/f to EOB and ambulated to bathroom w/ RW, t/f to toilet, voided, and doffed shirt and underwear. Pt ambulated to shower and t/f to TTB. Pt performed UB/LB bathing, w/ 1 sit<>stand t/f. Pt ambulated to closet, gathered clothing, and performed UB/LB dressing on EOB. Pt ambulated to recliner to don shoes and ambulated to sink to perform hair and teeth brushing while standing w/ RW. Pt reported that she would have hospital bed when she was discharged home. Pt completed home management activity of making bed while using RW in safe manner. Pt educated on energy conservation techniques for home and community. Pt seated in recliner, breakfast set-up, and all needs nearby when leaving.  Therapy Documentation Precautions:  Precautions Precautions: Fall Precaution Comments: HOH L ear since meningitis Restrictions Weight Bearing Restrictions: No  See FIM for current functional status  Therapy/Group: Individual Therapy  [name] [name] 02/07/2014, 11:39 AM

## 2014-02-07 NOTE — Progress Notes (Signed)
[facility] PHYSICAL MEDICINE & REHABILITATION     PROGRESS NOTE    Subjective/Complaints: Rested well. Had another busy.  Objective: Vital Signs: Blood pressure 114/53, pulse 78, temperature 98.9 F (37.2 C), temperature source Oral, resp. rate 18, height 5\' 6"  (1.676 m), weight 106.006 kg (233 lb 11.2 oz), SpO2 97.00%. No results found. No results found for this basename: WBC, HGB, HCT, PLT,  in the last 72 hours No results found for this basename: NA, K, CL, CO, GLUCOSE, BUN, CREATININE, CALCIUM,  in the last 72 hours CBG (last 3)  No results found for this basename: GLUCAP,  in the last 72 hours  Wt Readings from Last 3 Encounters:  02/06/14 106.006 kg (233 lb 11.2 oz)  01/27/14 108.5 kg (239 lb 3.2 oz)  10/17/13 107.956 kg (238 lb)    Physical Exam:   Constitutional: She is oriented to person, place, and time. She appears well-developed and well-nourished. Multiple scabbed herpetic lesions on lips have almost resolved HENT: oral mucosa pink and moist. Lesions on lips resolving Head: Normocephalic and atraumatic.  Eyes: Conjunctivae are normal. Pupils are equal, round, and reactive to light. Anicteric. Visual acuity intact Neck: Normal range of motion. Neck supple.  Cardiovascular: Normal rate and regular rhythm.  Respiratory: Effort normal and breath sounds normal. No respiratory distress. She has no wheezes.  GI: Soft. Bowel sounds are normal. She exhibits no distension. There is no tenderness.  Musculoskeletal: She exhibits no edema and no tenderness.  Neurological: She is alert and oriented to person, place, and time.  Speech normal.     motor strength is 4/5 bilateral deltoid, biceps, triceps, grip. 4 minus bilateral hip flexors, 4 to 4+ knee extensors ankle dorsiflexors and plantar flexors  Sensation intact to light touch bilateral upper and lower limbs  Deep tendon reflexes 2+ bilateral biceps, triceps, brachioradialis, patellar, 1+ at the ankles  Skin: Skin is warm  and dry.   Assessment/Plan: 1. Functional deficits secondary to bacterial meningitis which require 3+ hours per day of interdisciplinary therapy in a comprehensive inpatient rehab setting. Physiatrist is providing close team supervision and 24 hour management of active medical problems listed below. Physiatrist and rehab team continue to assess barriers to discharge/monitor patient progress toward functional and medical goals.  FIM: FIM - Bathing Bathing Steps Patient Completed: Chest;Right Arm;Left Arm;Abdomen;Front perineal area;Buttocks;Right upper leg;Left upper leg;Right lower leg (including foot);Left lower leg (including foot) Bathing: 5: Supervision: Safety issues/verbal cues  FIM - Upper Body Dressing/Undressing Upper body dressing/undressing steps patient completed: Thread/unthread right bra strap;Thread/unthread left bra strap;Hook/unhook bra;Thread/unthread right sleeve of pullover shirt/dresss;Thread/unthread left sleeve of pullover shirt/dress;Put head through opening of pull over shirt/dress;Pull shirt over trunk Upper body dressing/undressing: 5: Supervision: Safety issues/verbal cues FIM - Lower Body Dressing/Undressing Lower body dressing/undressing steps patient completed: Thread/unthread right underwear leg;Thread/unthread left underwear leg;Pull underwear up/down;Thread/unthread right pants leg;Thread/unthread left pants leg;Pull pants up/down;Don/Doff right sock;Don/Doff left sock;Don/Doff right shoe;Don/Doff left shoe;Fasten/unfasten right shoe;Fasten/unfasten left shoe Lower body dressing/undressing: 5: Supervision: Safety issues/verbal cues  FIM - Toileting Toileting steps completed by patient: Adjust clothing prior to toileting;Performs perineal hygiene;Adjust clothing after toileting Toileting Assistive Devices: Grab bar or rail for support Toileting: 5: Supervision: Safety issues/verbal cues  FIM - Diplomatic Services operational officerToilet Transfers Toilet Transfers Assistive Devices: Grab  bars;Walker Toilet Transfers: 5-From toilet/BSC: Supervision (verbal cues/safety issues);5-To toilet/BSC: Supervision (verbal cues/safety issues)  FIM - BankerBed/Chair Transfer Bed/Chair Transfer Assistive Devices: Walker;Arm rests Bed/Chair Transfer: 5: Chair or W/C > Bed: Supervision (verbal cues/safety issues);5: Bed >  Chair or W/C: Supervision (verbal cues/safety issues)  FIM - Locomotion: Wheelchair Distance: 150 Locomotion: Wheelchair: 0: Activity did not occur FIM - Locomotion: Ambulation Locomotion: Ambulation Assistive Devices: Other (comment);Walker - Rolling (walker then L HHA) Ambulation/Gait Assistance: 4: Min assist;5: Supervision Locomotion: Ambulation: 1: Travels less than 50 ft with minimal assistance (Pt.>75%)  Comprehension Comprehension Mode: Auditory Comprehension: 6-Follows complex conversation/direction: With extra time/assistive device  Expression Expression Mode: Verbal Expression: 6-Expresses complex ideas: With extra time/assistive device  Social Interaction Social Interaction: 6-Interacts appropriately with others with medication or extra time (anti-anxiety, antidepressant).  Problem Solving Problem Solving: 6-Solves complex problems: With extra time  Memory Memory: 5-Recognizes or recalls 90% of the time/requires cueing < 10% of the time  Medical Problem List and Plan:  1. Functional deficits secondary to Bacterial meningitis with decreased balance and LE strength.  2. DVT Prophylaxis/Anticoagulation: Pharmaceutical: Lovenox  3. Pain Management: Tylenol prn pain.  4. Mood: Will continue Lexapro. LCSW to follow for evaluation and support.  5. Neuropsych: This patient is capable of making decisions on her own behalf.  6. Skin/Wound Care: Routine pressure measures. Maintain adequate nutrition and hydration status.  7. Fluids/Electrolytes/Nutrition: Monitor I/O. Continue ensure supplements bid.   8. Sepsis due to bacterial meningitis: IV ceftriaxone  completed. No oral abx course recommended 9. HSV stomatitis: valtrex completed   10. HTN: Monitor BP every 8 hours. Continue   coreg. Lisinopril resumed at 10mg  daily with improvement 11. Elevated troponin: Negative Myoview study Aug 2014. Follow up study after discharge.  12. Hyponatermia: labs improved 13. Constipation:     -maintenance medications to continue 14. Eye pressure--?borderline glaucoma---outpt follow up  -symptoms seem to have improved  LOS (Days) 9 A FACE TO FACE EVALUATION WAS PERFORMED  SWARTZ,ZACHARY T 02/07/2014 8:36 AM

## 2014-02-07 NOTE — Progress Notes (Signed)
Note reviewed and accurately reflects treatment session.   

## 2014-02-07 NOTE — Progress Notes (Signed)
Physical Therapy Session Note  Patient Details  Name: [name] [name] Date of Birth: 12/25/1950  Today's Date: 02/07/2014 PT Individual Time: 1403-1503 PT Individual Time Calculation (min): 60 min   Short Term Goals: Week 1:  PT Short Term Goal 1 (Week 1): = LTGs due to short LOS  Skilled Therapeutic Interventions/Progress Updates:   Session focused on functional gait and transfers with RW in pt room simulating home environment, gait on uneven terrain outdoors for community mobility training with overall close S and education on safety with mobility out in the community with RW, stair negotiation in stairwell with improvement in confidence from this morning (close S), and Nustep x 5 min for general strengthening and endurance. Positioned in recliner at end of session with all needs in reach.  Therapy Documentation Precautions:  Precautions Precautions: Fall Precaution Comments: HOH L ear since meningitis Restrictions Weight Bearing Restrictions: No   Pain: Denies pain  See FIM for current functional status  Therapy/Group: Individual Therapy  Karolee Stamps [facility] 02/07/2014, 3:17 PM

## 2014-02-07 NOTE — Progress Notes (Signed)
Physical Therapy Session Note  Patient Details  Name: ARNIKA BYERLEY MRN: [id number] Date of Birth: 12/26/1950  Today's Date: 02/07/2014 PT Individual Time: 1005-1105 PT Individual Time Calculation (min): 60 min   Short Term Goals: Week 1:  PT Short Term Goal 1 (Week 1): = LTGs due to short LOS  Skilled Therapeutic Interventions/Progress Updates:    Functional gait with RW in pt's room for toileting needs with overall S using RW, neuro re-ed for balance reactions and dynamic standing balance while standing on compliant surface without UE support while reaching for horseshoes to simulate functional reaching task, stair negotiation for home environment training in the stairwell with steady A, simulated car transfer with overall S using RW, and gait training on unit for functional endurance training > 200' with cues for posture, relax shoulders, and to stay inside RW as fatiguing. Rest breaks taken as needed.   Therapy Documentation Precautions:  Precautions Precautions: Fall Precaution Comments: HOH L ear since meningitis Restrictions Weight Bearing Restrictions: No  Pain:  Denies pain.  See FIM for current functional status  Therapy/Group: Individual Therapy  Karolee Stamps [facility] 02/07/2014, 11:16 AM

## 2014-02-08 ENCOUNTER — Encounter (HOSPITAL_COMMUNITY): Payer: Federal, State, Local not specified - PPO | Admitting: Occupational Therapy

## 2014-02-08 ENCOUNTER — Inpatient Hospital Stay (HOSPITAL_COMMUNITY): Payer: Federal, State, Local not specified - PPO | Admitting: Occupational Therapy

## 2014-02-08 ENCOUNTER — Inpatient Hospital Stay (HOSPITAL_COMMUNITY): Payer: Federal, State, Local not specified - PPO

## 2014-02-08 NOTE — Progress Notes (Signed)
Notes reviewed and accurately reflect treatment sessions.   

## 2014-02-08 NOTE — Progress Notes (Signed)
Occupational Therapy Session Notes  Patient Details  Name: [name] [name] MRN: [id number] Date of Birth: [birth year]  Today's Date: 02/08/2014   Short Term Goals: Week 1:  OT Short Term Goal 1 (Week 1): Short term goals= long term goals  Skilled Therapeutic Interventions/Progress Updates:   Session 1: OT Individual Time: [phone] OT Individual Time Calculation (min): 60 min Pt seated in recliner when arriving, reporting not sleeping well but having no pain. Pt ambulated to bathroom w/ RW, voided, and ambulated to sink to brush teeth while standing. Pt gathered materials for B&D, storing in walker bag for walk to apartment. Pt t/f to TTB and completed UB/LB B&D. Pt ambulated to room, t/f to recliner, and completed hair brushing. Pt seated in recliner w/ all needs nearby and breakfast set-up when leaving.    Session 2: OT Individual Time: 0930-1000 OT Individual Time Calculation (min): 30 min Pt seated in recliner w/ sister in room when arriving, reporting no pain. Educated pt and sister on safe walker use, safe t/f technique, energy conservation, home management techniques and safety in the home (including lighting, especially when going to bathroom at night), DME and AE use, and home set-up (bathrooms specifically). Pt ambulated to bathroom, demonstrated toilet t/f to sister, voided, and performed toilet hygiene. Pt brought to tub room and demonstrated safe t/f to TTB, educating sister on technique. Pt seated in w/c in rehab gym w/ sister, waiting for PT when leaving.   Session 3: OT Individual Time: 2:00-2:30 OT Individual Time Calculation (min): 30 min Pt seated in recliner w/ sister and nurse tech in room, reporting no pain. Pt ambulated w/ RW to linen closet and pt laundry room and completed simulated laundry activity, demonstrating safe use of walker. Pt ambulated to family room and completed furniture t/f onto couch and sofa, demonstrating safe t/f technique. Pt ambulated back to  room and discussed community mobility w/ walker and energy conservation while in community. Pt seated in recliner w/ all needs nearby when leaving.  Therapy Documentation Precautions:  Precautions Precautions: Fall Precaution Comments: HOH L ear since meningitis Restrictions Weight Bearing Restrictions: No  See FIM for current functional status  Therapy/Group: Individual Therapy  [name] [name] [name] 02/08/2014, 11:56 AM

## 2014-02-08 NOTE — Progress Notes (Signed)
Occupational Therapy Discharge Summary  Patient Details  Name: [name] [name] MRN: [id number] Date of Birth: [birth year]  Today's Date: 02/08/2014  Patient has met 10 of 10 long term goals due to improved activity tolerance, improved balance, postural control and improved awareness.  Patient to discharge at overall Modified Independent level.  Patient's care partner is independent to provide the necessary physical assistance at discharge.    Reasons goals not met: N/a  Recommendation:  No further skilled OT services needed at this time.  Equipment: TTB, 3-in-1 commode  Reasons for discharge: treatment goals met and discharge from hospital  Patient/family agrees with progress made and goals achieved: Yes  OT Discharge Precautions/Restrictions  Precautions Precautions: Fall Precaution Comments: HOH L ear since meningitis Restrictions Weight Bearing Restrictions: No  General Chart Reviewed: Yes Family/Caregiver Present: No  ADL See FIM  Vision/Perception  Vision- History Baseline Vision/History: Wears glasses Wears Glasses: Reading only Patient Visual Report: Other (comment);Diplopia Vision- Assessment Vision Assessment?: No apparent visual deficits Eye Alignment: Within Functional Limits Ocular Range of Motion: Within Functional Limits Tracking/Visual Pursuits: Able to track stimulus in all quads without difficulty Saccades: Within functional limits   Cognition Overall Cognitive Status: Within Functional Limits for tasks assessed Arousal/Alertness: Awake/alert Orientation Level: Oriented X4 Attention: Alternating  Sensation Sensation Light Touch: Appears Intact Coordination Gross Motor Movements are Fluid and Coordinated: Yes Fine Motor Movements are Fluid and Coordinated: Not tested  Motor  Motor Motor: Within Functional Limits  Mobility  Bed Mobility Bed Mobility: Rolling Right;Supine to Sit Rolling Right: 6: Modified independent (Device/Increase  time) Supine to Sit: 6: Modified independent (Device/Increase time) Sit to Supine: 6: Modified independent (Device/Increase time) Transfers Transfers: Sit to Stand;Stand to Sit Sit to Stand: 6: Modified independent (Device/Increase time) Stand to Sit: 6: Modified independent (Device/Increase time)  Trunk/Postural Assessment  Cervical Assessment Cervical Assessment: Within Functional Limits   Balance See PT note  Extremity/Trunk Assessment RUE Assessment RUE Assessment: Within Functional Limits LUE Assessment LUE Assessment: Within Functional Limits  See FIM for current functional status  [name] 02/08/2014, 8:15 AM

## 2014-02-08 NOTE — Progress Notes (Signed)
Discharge note reviewed and accurately reflects discharge summary.  

## 2014-02-08 NOTE — Progress Notes (Signed)
[facility] PHYSICAL MEDICINE & REHABILITATION     PROGRESS NOTE    Subjective/Complaints: No complaints except for occasional headaches. Feels much more confident about going home.  Objective: Vital Signs: Blood pressure 121/59, pulse 65, temperature 99.3 F (37.4 C), temperature source Oral, resp. rate 18, height 5\' 6"  (1.676 m), weight 106.006 kg (233 lb 11.2 oz), SpO2 98.00%. No results found. No results found for this basename: WBC, HGB, HCT, PLT,  in the last 72 hours No results found for this basename: NA, K, CL, CO, GLUCOSE, BUN, CREATININE, CALCIUM,  in the last 72 hours CBG (last 3)  No results found for this basename: GLUCAP,  in the last 72 hours  Wt Readings from Last 3 Encounters:  02/06/14 106.006 kg (233 lb 11.2 oz)  01/27/14 108.5 kg (239 lb 3.2 oz)  10/17/13 107.956 kg (238 lb)    Physical Exam:   Constitutional: She is oriented to person, place, and time. She appears well-developed and well-nourished. Multiple scabbed herpetic lesions on lips have almost resolved HENT: oral mucosa pink and moist. Lesions on lips resolving Head: Normocephalic and atraumatic.  Eyes: Conjunctivae are normal. Pupils are equal, round, and reactive to light. Anicteric. Visual acuity intact Neck: Normal range of motion. Neck supple.  Cardiovascular: Normal rate and regular rhythm.  Respiratory: Effort normal and breath sounds normal. No respiratory distress. She has no wheezes.  GI: Soft. Bowel sounds are normal. She exhibits no distension. There is no tenderness.  Musculoskeletal: She exhibits no edema and no tenderness.  Neurological: She is alert and oriented to person, place, and time.  Speech normal.     motor strength is 4/5 bilateral deltoid, biceps, triceps, grip. 4 minus bilateral hip flexors, 4 to 4+ knee extensors ankle dorsiflexors and plantar flexors  Sensation intact to light touch bilateral upper and lower limbs  Deep tendon reflexes 2+ bilateral biceps, triceps,  brachioradialis, patellar, 1+ at the ankles  Skin: Skin is warm and dry.   Assessment/Plan: 1. Functional deficits secondary to bacterial meningitis which require 3+ hours per day of interdisciplinary therapy in a comprehensive inpatient rehab setting. Physiatrist is providing close team supervision and 24 hour management of active medical problems listed below. Physiatrist and rehab team continue to assess barriers to discharge/monitor patient progress toward functional and medical goals.  Home tomorrow after MD rounds  FIM: FIM - Bathing Bathing Steps Patient Completed: Chest;Right Arm;Left Arm;Abdomen;Front perineal area;Buttocks;Right upper leg;Left upper leg;Right lower leg (including foot);Left lower leg (including foot) Bathing: 5: Supervision: Safety issues/verbal cues  FIM - Upper Body Dressing/Undressing Upper body dressing/undressing steps patient completed: Thread/unthread right bra strap;Thread/unthread left bra strap;Hook/unhook bra;Thread/unthread right sleeve of pullover shirt/dresss;Thread/unthread left sleeve of pullover shirt/dress;Put head through opening of pull over shirt/dress;Pull shirt over trunk Upper body dressing/undressing: 5: Supervision: Safety issues/verbal cues FIM - Lower Body Dressing/Undressing Lower body dressing/undressing steps patient completed: Thread/unthread right underwear leg;Thread/unthread left underwear leg;Pull underwear up/down;Thread/unthread right pants leg;Thread/unthread left pants leg;Pull pants up/down;Don/Doff right sock;Don/Doff left sock;Don/Doff right shoe;Don/Doff left shoe;Fasten/unfasten right shoe;Fasten/unfasten left shoe Lower body dressing/undressing: 5: Supervision: Safety issues/verbal cues  FIM - Toileting Toileting steps completed by patient: Adjust clothing prior to toileting;Performs perineal hygiene;Adjust clothing after toileting Toileting Assistive Devices: Grab bar or rail for support Toileting: 5: Supervision:  Safety issues/verbal cues  FIM - Diplomatic Services operational officerToilet Transfers Toilet Transfers Assistive Devices: Grab bars;Walker Toilet Transfers: 5-From toilet/BSC: Supervision (verbal cues/safety issues);5-To toilet/BSC: Supervision (verbal cues/safety issues)  FIM - BankerBed/Chair Transfer Bed/Chair Transfer Assistive Devices: Walker;Arm rests  Bed/Chair Transfer: 5: Chair or W/C > Bed: Supervision (verbal cues/safety issues);5: Bed > Chair or W/C: Supervision (verbal cues/safety issues)  FIM - Locomotion: Wheelchair Distance: 150 Locomotion: Wheelchair: 0: Activity did not occur FIM - Locomotion: Ambulation Locomotion: Ambulation Assistive Devices: Designer, industrial/product Ambulation/Gait Assistance: 5: Supervision Locomotion: Ambulation: 5: Travels 150 ft or more with supervision/safety issues  Comprehension Comprehension Mode: Auditory Comprehension: 6-Follows complex conversation/direction: With extra time/assistive device  Expression Expression Mode: Verbal Expression: 6-Expresses complex ideas: With extra time/assistive device  Social Interaction Social Interaction: 6-Interacts appropriately with others with medication or extra time (anti-anxiety, antidepressant).  Problem Solving Problem Solving: 6-Solves complex problems: With extra time  Memory Memory: 5-Recognizes or recalls 90% of the time/requires cueing < 10% of the time  Medical Problem List and Plan:  1. Functional deficits secondary to Bacterial meningitis with decreased balance and LE strength.  2. DVT Prophylaxis/Anticoagulation: Pharmaceutical: Lovenox  3. Pain Management: Tylenol prn pain.  4. Mood: Will continue Lexapro. LCSW to follow for evaluation and support.  5. Neuropsych: This patient is capable of making decisions on her own behalf.  6. Skin/Wound Care: Routine pressure measures. Maintain adequate nutrition and hydration status.  7. Fluids/Electrolytes/Nutrition: Monitor I/O. Continue ensure supplements bid.   8. Sepsis due to  bacterial meningitis: IV ceftriaxone completed. No oral abx course recommended 9. HSV stomatitis: valtrex completed   10. HTN: Monitor BP every 8 hours. Continue   coreg. Lisinopril resumed at 10mg  daily with improvement 11. Elevated troponin: Negative Myoview study Aug 2014. Follow up study after discharge.  12. Hyponatermia: labs improved 13. Constipation:     -maintenance medications to continue 14. Eye pressure--?borderline glaucoma---outpt follow up as needed  -symptoms seem to have improved  LOS (Days) 10 A FACE TO FACE EVALUATION WAS PERFORMED  Numair Masden T 02/08/2014 8:31 AM

## 2014-02-08 NOTE — Discharge Instructions (Signed)
Inpatient Rehab Discharge Instructions  [name] [name] Discharge date and time: 02/09/14   Activities/Precautions/ Functional Status: Activity: activity as tolerated No Driving Diet: regular diet Wound Care: none needed  Functional status:  ___ No restrictions     ___ Walk up steps independently ___ 24/7 supervision/assistance   ___ Walk up steps with assistance _X__ Intermittent supervision/assistance  ___ Bathe/dress independently _X__ Walk with walker     ___ Bathe/dress with assistance ___ Walk Independently    ___ Shower independently ___ Walk with assistance    ___ Shower with assistance ___ No alcohol     ___ Return to work/school ________  COMMUNITY REFERRALS UPON DISCHARGE:   Home Health:   PT       Agency:  [facility] Phone:  [phone] Medical Equipment/Items Ordered:  Rolling walker, bedside commode, hospital bed, and tub transfer bench  Agency/Supplier:  [facility]         Phone:  [phone]  GENERAL COMMUNITY RESOURCES FOR PATIENT/FAMILY: Mental Health:  Follow up with Dr. Orie Fisherman at [facility] in [facility], as needed  Special Instructions:    My questions have been answered and I understand these instructions. I will adhere to these goals and the provided educational materials after my discharge from the hospital.  Patient/Caregiver Signature _______________________________ Date __________  Clinician Signature _______________________________________ Date __________  Please bring this form and your medication list with you to all your follow-up doctor's appointments.

## 2014-02-08 NOTE — Progress Notes (Signed)
Physical Therapy Discharge Summary  Patient Details  Name: [name] [name] MRN: [id number] Date of Birth: 1950-08-20  Today's Date: 02/08/2014   Patient has met 8 of 8 long term goals due to improved activity tolerance, improved balance, increased strength and ability to compensate for deficits.  Patient to discharge at an ambulatory level Modified Independent with RW. Patient's sister is independent to provide the necessary supervision assistance at discharge and successfully completed family education.  Reasons goals not met: n/a all goals met at this time  Recommendation:  Patient will benefit from ongoing skilled PT services in home health setting to continue to advance safe functional mobility, address ongoing impairments in gait, balance, endurance, functional mobility, balance strategies, and minimize fall risk.  Equipment: RW  Reasons for discharge: treatment goals met and discharge from hospital  Patient/family agrees with progress made and goals achieved: Yes  PT Discharge Precautions/Restrictions Precautions Precautions: Fall Precaution Comments: HOH L ear since meningitis Restrictions Weight Bearing Restrictions: No Pain Pain Assessment Pain Assessment: No/denies pain Pain Score: 0-No pain  Cognition Overall Cognitive Status: Within Functional Limits for tasks assessed Orientation Level: Oriented X4 Memory: Impaired Memory Impairment: Decreased short term memory;Decreased recall of new information Safety/Judgment: Appears intact Sensation Sensation Light Touch: Appears Intact Proprioception: Appears Intact Coordination Gross Motor Movements are Fluid and Coordinated: Yes Motor  Motor Motor: Within Functional Limits  Locomotion  Ambulation Ambulation/Gait Assistance: 6: Modified independent (Device/Increase time)  Trunk/Postural Assessment  Cervical Assessment Cervical Assessment: Within Functional Limits Thoracic Assessment Thoracic Assessment:   (slightly flexed posture) Lumbar Assessment Lumbar Assessment:  (posterior pelvic tilt) Postural Control Postural Control: Within Functional Limits Righting Reactions: using RW for balance at this time  Balance Berg Balance Test Sit to Stand: Able to stand  independently using hands Standing Unsupported: Able to stand 2 minutes with supervision Sitting with Back Unsupported but Feet Supported on Floor or Stool: Able to sit safely and securely 2 minutes Stand to Sit: Controls descent by using hands Transfers: Able to transfer safely, definite need of hands Standing Unsupported with Eyes Closed: Able to stand 10 seconds safely Standing Ubsupported with Feet Together: Able to place feet together independently and stand for 1 minute with supervision From Standing, Reach Forward with Outstretched Arm: Can reach forward >12 cm safely (5") From Standing Position, Pick up Object from Floor: Able to pick up shoe, needs supervision From Standing Position, Turn to Look Behind Over each Shoulder: Turn sideways only but maintains balance Turn 360 Degrees: Needs close supervision or verbal cueing Standing Unsupported, Alternately Place Feet on Step/Stool: Able to complete >2 steps/needs minimal assist Standing Unsupported, One Foot in Front: Needs help to step but can hold 15 seconds Standing on One Leg: Tries to lift leg/unable to hold 3 seconds but remains standing independently Total Score: 35 Static Sitting Balance Static Sitting - Level of Assistance: 6: Modified independent (Device/Increase time) Dynamic Sitting Balance Dynamic Sitting - Level of Assistance: 6: Modified independent (Device/Increase time) Static Standing Balance Static Standing - Level of Assistance: 6: Modified independent (Device/Increase time) Dynamic Standing Balance Dynamic Standing - Level of Assistance: 6: Modified independent (Device/Increase time) Extremity Assessment      RLE Assessment RLE Assessment:  (grossly  WFL; decreased muscular endurance) LLE Assessment LLE Assessment:  (grossly WFL; decreased muscular endurance)  See FIM for current functional status  [facility] 02/08/2014, 12:28 PM

## 2014-02-08 NOTE — Progress Notes (Signed)
Physical Therapy Session Note  Patient Details  Name: [name] [name] MRN: [id number] Date of Birth: December 21, 1950  Today's Date: 02/08/2014 PT Individual Time: 1003-1103 PT Individual Time Calculation (min): 60 min   Short Term Goals: Week 1:  PT Short Term Goal 1 (Week 1): = LTGs due to short LOS  Skilled Therapeutic Interventions/Progress Updates:   Session focused on family education with pt's sister in preparation for d/c home tomorrow. Demonstrated stair negotiation with sister return demonstrating safe guarding method (pt able to perform with S) up/down flight of stairs in stairwell, bed mobility and transfers in ADL apartment at mod I level, simulated car transfer with RW S/mod I, and energy conservation education. Pt overall mod I with RW and made mod I in the room. Retested [employer] test (without AD), see score below and educated on fall risk at this time and therefore, recommendation to use RW for all mobility. Pt denies any further concerns and feels prepared for d/c home tomorrow with sister to provide S. Reviewed importance of HEP to continue functional strengthening and endurance to which pt and sister verbalized understanding and agreement.  Therapy Documentation Precautions:  Precautions Precautions: Fall Precaution Comments: HOH L ear since meningitis Restrictions Weight Bearing Restrictions: No Pain: Pain Assessment Pain Assessment: No/denies pain Pain Score: 0-No pain Locomotion : Ambulation Ambulation/Gait Assistance: 6: Modified independent (Device/Increase time)   Balance: Berg Balance Test Sit to Stand: Able to stand  independently using hands Standing Unsupported: Able to stand 2 minutes with supervision Sitting with Back Unsupported but Feet Supported on Floor or Stool: Able to sit safely and securely 2 minutes Stand to Sit: Controls descent by using hands Transfers: Able to transfer safely, definite need of hands Standing Unsupported with Eyes Closed:  Able to stand 10 seconds safely Standing Ubsupported with Feet Together: Able to place feet together independently and stand for 1 minute with supervision From Standing, Reach Forward with Outstretched Arm: Can reach forward >12 cm safely (5") From Standing Position, Pick up Object from Floor: Able to pick up shoe, needs supervision From Standing Position, Turn to Look Behind Over each Shoulder: Turn sideways only but maintains balance Turn 360 Degrees: Needs close supervision or verbal cueing Standing Unsupported, Alternately Place Feet on Step/Stool: Able to complete >2 steps/needs minimal assist Standing Unsupported, One Foot in Front: Needs help to step but can hold 15 seconds Standing on One Leg: Tries to lift leg/unable to hold 3 seconds but remains standing independently Total Score: 35  See FIM for current functional status  Therapy/Group: Individual Therapy  Karolee Stamps [facility] 02/08/2014, 12:12 PM

## 2014-02-09 DIAGNOSIS — D509 Iron deficiency anemia, unspecified: Secondary | ICD-10-CM

## 2014-02-09 NOTE — Progress Notes (Signed)
[name] [name] is a 63 y.o. female 1950-09-27 [id number]  Subjective: No new complaints. No new problems. Slept well. Feeling OK.  Objective: Vital signs in last 24 hours: Temp:  [98.2 F (36.8 C)] 98.2 F (36.8 C) (10/10 0547) Pulse Rate:  [72-73] 72 (10/09 1956) Resp:  [18-20] 18 (10/10 0547) BP: (111-126)/(48-82) 122/56 mmHg (10/09 1956) SpO2:  [98 %-100 %] 98 % (10/10 0547) Weight change:  Last BM Date: 02/08/14  Intake/Output from previous day: 10/09 0701 - 10/10 0700 In: 600 [P.O.:600] Out: -  Last cbgs: CBG (last 3)  No results found for this basename: GLUCAP,  in the last 72 hours   Physical Exam General: No apparent distress   HEENT: not dry Lungs: Normal effort. Lungs clear to auscultation, no crackles or wheezes. Cardiovascular: Regular rate and rhythm, no edema Abdomen: S/NT/ND; BS(+) Musculoskeletal:  unchanged Neurological: No new neurological deficits Wounds: N/A    Skin: clear  Aging changes Mental state: Alert, oriented, cooperative    Lab Results: BMET    Component Value Date/Time   NA 137 01/30/2014 0810   K 4.1 01/30/2014 0810   CL 101 01/30/2014 0810   CO2 24 01/30/2014 0810   GLUCOSE 123* 01/30/2014 0810   BUN 16 01/30/2014 0810   CREATININE 0.72 01/30/2014 0810   CREATININE 0.80 10/17/2013 1327   CALCIUM 9.0 01/30/2014 0810   GFRNONAA >90 01/30/2014 0810   GFRNONAA 79 10/17/2013 1327   GFRAA >90 01/30/2014 0810   GFRAA >89 10/17/2013 1327   CBC    Component Value Date/Time   WBC 5.6 01/30/2014 0810   WBC 4.1* 08/04/2012 1750   RBC 4.48 01/30/2014 0810   RBC 4.85 08/04/2012 1750   HGB 11.6* 01/30/2014 0810   HGB 12.3 08/04/2012 1750   HCT 37.0 01/30/2014 0810   HCT 40.2 08/04/2012 1750   PLT 306 01/30/2014 0810   MCV 82.6 01/30/2014 0810   MCV 82.9 08/04/2012 1750   MCH 25.9* 01/30/2014 0810   MCH 25.4* 08/04/2012 1750   MCHC 31.4 01/30/2014 0810   MCHC 30.6* 08/04/2012 1750   RDW 13.3 01/30/2014 0810   LYMPHSABS 1.4 01/30/2014 0810   MONOABS 0.6  01/30/2014 0810   EOSABS 0.0 01/30/2014 0810   BASOSABS 0.0 01/30/2014 0810    Studies/Results: No results found.  Medications: I have reviewed the patient's current medications.  Assessment/Plan:  1. Functional deficits secondary to Bacterial meningitis with decreased balance and LE strength.  2. DVT Prophylaxis/Anticoagulation: Pharmaceutical: Lovenox  3. Pain Management: Tylenol prn pain.  4. Mood: Will continue Lexapro. LCSW to follow for evaluation and support.  5. Neuropsych: This patient is capable of making decisions on her own behalf.  6. Skin/Wound Care: Routine pressure measures. Maintain adequate nutrition and hydration status.  7. Fluids/Electrolytes/Nutrition: Monitor I/O. Continue ensure supplements bid.  8. Sepsis due to bacterial meningitis: IV ceftriaxone completed. No oral abx course recommended  9. HSV stomatitis: valtrex completed  10. HTN: Monitor BP every 8 hours. Continue coreg. Lisinopril resumed at 10mg  daily with improvement  11. Elevated troponin: Negative Myoview study Aug 2014. Follow up study after discharge.  12. Hyponatermia: labs improved  13. Constipation:  -maintenance medications to continue  14. Eye pressure--?borderline glaucoma---outpt follow up as needed  -symptoms seem to have improved  D/c home today      Length of stay, days: 11  Sonda Primes , MD 02/09/2014, 8:17 AM

## 2014-02-09 NOTE — Progress Notes (Signed)
Pt discharged home with sister at 2. D/C instructions given on Friday and pt verbalized understanding with no further questions. VSS no c/o pain. Mick Sell RN

## 2014-02-11 NOTE — Discharge Summary (Signed)
Physician Discharge Summary  Patient ID: [name] [name] MRN: [id number] DOB/AGE: 1950/09/04 63 y.o.  Admit date: 01/29/2014 Discharge date: 02/09/2014  Discharge Diagnoses:  Principal Problem:   Meningitis due to bacteria Active Problems:   HTN (hypertension)   Depression, reactive   Demand myocardial ischemic related infarction   Discharged Condition: Stable.  Labs:  Basic Metabolic Panel:    Component Value Date/Time   NA 137 01/30/2014 0810   K 4.1 01/30/2014 0810   CL 101 01/30/2014 0810   CO2 24 01/30/2014 0810   GLUCOSE 123* 01/30/2014 0810   BUN 16 01/30/2014 0810   CREATININE 0.72 01/30/2014 0810   CREATININE 0.80 10/17/2013 1327   CALCIUM 9.0 01/30/2014 0810   GFRNONAA >90 01/30/2014 0810   GFRNONAA 79 10/17/2013 1327   GFRAA >90 01/30/2014 0810   GFRAA >89 10/17/2013 1327     CBC: CBC Latest Ref Rng 01/30/2014 01/28/2014 01/26/2014  WBC 4.0 - 10.5 K/uL 5.6 7.1 10.0  Hemoglobin 12.0 - 15.0 g/dL 11.6(L) 11.8(L) 12.2  Hematocrit 36.0 - 46.0 % 37.0 37.2 37.7  Platelets 150 - 400 K/uL 306 278 245    Filed Vitals:   02/08/14 1300 02/08/14 1406 02/08/14 1956 02/09/14 0547  BP: 111/48 112/70 122/56   Pulse: 73  72   Temp: 98.2 F (36.8 C)  98.2 F (36.8 C) 98.2 F (36.8 C)  TempSrc: Oral  Oral Oral  Resp: 18  20 18   Height:      Weight:      SpO2: 100%  100% 98%      Brief HPI:   [name] [name] is a 63 y.o. female with history of HTN, depression, anxiety disorder who was admitted on 01/18/14 with lethargy, fever 101.3 with leucocytosis and elevated troponin levels. She was started on IV antibiotics for SIRS and blood cultures done positive for strep Pneumo and LP showed elevated protein >600 with WBC 11321.  CT head with slight asymmetry of cerebellar peduncles and Dr. Roseanne Reno recommended MRI brain for work up. MRI brain with diffusion abnormality around atrial of lateral ventricles and anterior to both frontal horns suggestive of meningitis and ventriculitis. ID  was consulted for input and recommended 14 days of IV ceftriaxone for treatment of Meningitis, ventriculitis and pneumococcal bacteremia. 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 Patient with elevated cardiac enzymes and cardiology felt that this was demand ischemia rather than NSTEMI and recommended outpatient Myoview as patient asymptomatic. Follow up MRI brain done 09/28 with slight improvement in scattered restriction foci and no acute infarct or abscess   Hospital Course: [name] [name] was admitted to rehab 01/29/2014 for inpatient therapies to consist of PT, ST and OT at least three hours five days a week. Past admission physiatrist, therapy team and rehab RN have worked together to provide customized collaborative inpatient rehab. She was completed her antibiotic regimen on 02/02/14 and has been afebrile. Blood pressures have been motioned every 8 hours and have been reasonably controlled on reduced dose of lisinopril. Dizziness was reported with activity and vestibular assessment reveled left unilateral vestibular hypofunction. She was educated on gaze stabilization exercises and this was incorporated with her therapy tasks.  She continues to have decreased hearing in the left ear as well as occasional blurry vision. She does have cerumen the ear canal that could be impacting her hearing and she is to follow up with PMD for ear irrigation. Po intake has improved and herpetic lesions of lips have resolved.  She has made  good progress during her rehab stay and is modified independent at discharge. She will continue to receive HHPT by [facility] past discharge.    Rehab course: During patient's stay in rehab weekly team conferences were held to monitor patient's progress, set goals and discuss barriers to discharge. Patient has had improvement in activity tolerance, balance, postural control, as well as ability to compensate for deficits.  Speech therapy has worked on Psychologist, occupational. Patient reached modified independent for cognitive linguistic function with functional tasks as well as tolerance of diet  therefore was discharged from ST services on 10/02. Patient is modified independent for ADL tasks as well as mobility. She requires guard assist for navigating stairs and for car transfers. Family education was done with sister who can provide intermittent supervision past discharge.    Disposition: 01-Home or Self Care   Diet: Regular.  Special Instructions: 1. Follow up with Dr. Nile Riggs for eye exam if vision does recover fully.      Medication List    STOP taking these medications       acyclovir ointment 5 %  Commonly known as:  ZOVIRAX     carbamide peroxide 6.5 % otic solution  Commonly known as:  DEBROX     cefTRIAXone 2 g in dextrose 5 % 50 mL     diphenhydramine-acetaminophen 25-500 MG Tabs  Commonly known as:  TYLENOL PM     magic mouthwash w/lidocaine Soln     valACYclovir 1000 MG tablet  Commonly known as:  VALTREX      TAKE these medications       alum & mag hydroxide-simeth 200-200-20 MG/5ML suspension  Commonly known as:  MAALOX/MYLANTA  Take 30 mLs by mouth every 4 (four) hours as needed for indigestion.     aspirin 81 MG chewable tablet  Chew 81 mg by mouth daily.     carvedilol 3.125 MG tablet  Commonly known as:  COREG  Take 1 tablet (3.125 mg total) by mouth 2 (two) times daily with a meal.     escitalopram 10 MG tablet  Commonly known as:  LEXAPRO  Take 1 tablet (10 mg total) by mouth daily.     lisinopril 20 MG tablet  Commonly known as:  PRINIVIL,ZESTRIL  Take 0.5 tablets (10 mg total) by mouth daily.     MULTIVITAMIN PO  Take 1 tablet by mouth daily.     senna 8.6 MG Tabs tablet  Commonly known as:  SENOKOT  Take 2 tablets (17.2 mg total) by mouth at bedtime.     simvastatin 20 MG tablet  Commonly known as:  ZOCOR  Take 20 mg by mouth daily.     vitamin A & D ointment  Apply topically as needed for  dry skin.       Follow-up Information   Follow up with Ranelle Oyster, MD On 03/15/2014. (Be there at 10 for  10:20 am appointment)    Specialty:  Physical Medicine and Rehabilitation   Contact information:   [address], [address] [place] [address] [phone]       Call Verne Carrow, MD. (for appointment in two weeks. )    Specialty:  Cardiology   Contact information:   [address] [place] [address] [phone]       Follow up with Tonye Pearson, MD On 03/06/2014. (@ 1:00 PM or to go sooner, you can see him at the walk-in clinic on 02-19-14 from  8-3 or 02-23-14 from 9-4)    Specialties:  Internal Medicine, Adolescent Medicine   Contact information:   [address] [place] [address] [phone]       Call SUMNER, PETER, JUSTIN, DO. (for follow up on meningitis. )    Specialty:  Neurology   Contact information:   [address] [place] [address] [phone]       Signed: Jacquelynn CreeLove, Edra Riccardi S 02/11/2014, 10:34 AM

## 2014-02-12 ENCOUNTER — Telehealth: Payer: Self-pay | Admitting: *Deleted

## 2014-02-13 ENCOUNTER — Inpatient Hospital Stay (HOSPITAL_COMMUNITY): Payer: Federal, State, Local not specified - PPO | Admitting: Occupational Therapy

## 2014-02-13 NOTE — Telephone Encounter (Signed)
[facility] - Theresa Duty [phone] - left message ok for PT 3x wk 2 wks and 2x wk for 2wks.

## 2014-03-06 ENCOUNTER — Encounter: Payer: Federal, State, Local not specified - PPO | Admitting: Physician Assistant

## 2014-03-06 ENCOUNTER — Encounter: Payer: Self-pay | Admitting: Internal Medicine

## 2014-03-06 ENCOUNTER — Ambulatory Visit (INDEPENDENT_AMBULATORY_CARE_PROVIDER_SITE_OTHER): Payer: Federal, State, Local not specified - PPO | Admitting: Physician Assistant

## 2014-03-06 ENCOUNTER — Ambulatory Visit (INDEPENDENT_AMBULATORY_CARE_PROVIDER_SITE_OTHER): Payer: Federal, State, Local not specified - PPO | Admitting: Internal Medicine

## 2014-03-06 ENCOUNTER — Encounter: Payer: Self-pay | Admitting: Physician Assistant

## 2014-03-06 VITALS — BP 130/80 | HR 71 | Temp 98.8°F | Resp 16 | Ht 65.0 in | Wt 234.6 lb

## 2014-03-06 VITALS — BP 140/90 | HR 54 | Ht 66.0 in | Wt 236.4 lb

## 2014-03-06 DIAGNOSIS — H9113 Presbycusis, bilateral: Secondary | ICD-10-CM

## 2014-03-06 DIAGNOSIS — I10 Essential (primary) hypertension: Secondary | ICD-10-CM

## 2014-03-06 DIAGNOSIS — R778 Other specified abnormalities of plasma proteins: Secondary | ICD-10-CM

## 2014-03-06 DIAGNOSIS — H905 Unspecified sensorineural hearing loss: Secondary | ICD-10-CM

## 2014-03-06 DIAGNOSIS — E785 Hyperlipidemia, unspecified: Secondary | ICD-10-CM

## 2014-03-06 DIAGNOSIS — G009 Bacterial meningitis, unspecified: Secondary | ICD-10-CM

## 2014-03-06 DIAGNOSIS — R7989 Other specified abnormal findings of blood chemistry: Secondary | ICD-10-CM

## 2014-03-06 MED ORDER — CARVEDILOL 3.125 MG PO TABS: 3.1250 mg | ORAL_TABLET | Freq: Two times a day (BID) | ORAL | Status: DC

## 2014-03-06 MED ORDER — LISINOPRIL 20 MG PO TABS: 20.0000 mg | ORAL_TABLET | Freq: Every day | ORAL | Status: DC

## 2014-03-06 NOTE — Patient Instructions (Signed)
INCREASE LISINOPRIL TO 20 MG DAILY; NEW RX SENT IN  A REFILL FOR COREG SENT IN  LAB WORK TO BE DONE IN 2 WEEKS  Your physician recommends that you schedule a follow-up appointment in: 6-8 WEEKS WITH DR. Clifton James OR SCOTT WEAVER, PAC SAME DAY DR. Clifton James IS IN THE OFFICE

## 2014-03-06 NOTE — Progress Notes (Signed)
Cardiology Office Note   Date:  03/06/2014   ID:  [name] [name], DOB 03-26-1951, MRN [id number]  PCP:  Tonye Pearson, MD  Cardiologist:  Dr. Verne Carrow     History of Present Illness: [name] [name] is a 63 y.o. female with a history of HTN, HL, depression, former tobacco abuse, strong family history of CAD and anxiety. She was evaluated by Dr. Clifton James in 2014 for fatigue, dyspnea and chest pressure. Stress Myoview demonstrated no ischemia. Echocardiogram demonstrated LV function and no significant valvular abnormalities.  She was admitted 9/18-9/29 with bacterial meningitis (blood culture positive for Strep pneumonia). Patient was noted to have an elevated troponin and was seen by cardiology. Echo cardiogram demonstrated normal LV function and normal wall motion. Altered mental status prohibited proceeding with cardiac catheterization. It was felt that her elevated troponin was likely related to demand ischemia. Recommendation was to consider outpatient stress testing. She was discharged to inpatient rehabilitation.  She was noted to have dizziness related to left unilateral vestibular hypofunction. She also had decreased hearing on the left.  She returns for follow-up.  She is here with her sister. She continues to complain of weakness, headaches and decreased energy. She works with physical therapy twice a week. Blood pressures at home a been in the 140s/60s. She has occasional chest pressure. This seems to be related to positional changes.  She really denies exertional symptoms. She denies significant dyspnea. She denies orthopnea or PND. She has dependent pedal edema. She denies syncope or near syncope.   Studies:  - Echo (9/15):  EF 55% to 60%. Wall motion was normal.  Grade 1 diastolic dysfunction  - Nuclear (8/14):  Normal stress nuclear study.  EF 74%.  - Carotid US (1/15):  No significant extracranial carotid artery stenosis   Recent Labs/Images: 10/17/2013:  LDL (calc) 85 01/18/2014: TSH 2.210 01/30/2014: ALT 33; BUN 16; Creatinine 0.72; Hemoglobin 11.6*; Potassium 4.1; Sodium 137     Wt Readings from Last 3 Encounters:  03/06/14 236 lb 6.4 oz (107.23 kg)  02/06/14 233 lb 11.2 oz (106.006 kg)  01/27/14 239 lb 3.2 oz (108.5 kg)     Past Medical History  Diagnosis Date  . Cataract   . Depression   . Anxiety   . HTN (hypertension)   . Hyperlipemia     Current Outpatient Prescriptions  Medication Sig Dispense Refill  . aspirin 81 MG chewable tablet Chew 81 mg by mouth daily.    . carvedilol (COREG) 3.125 MG tablet Take 1 tablet (3.125 mg total) by mouth 2 (two) times daily with a meal. 60 tablet 0  . escitalopram (LEXAPRO) 10 MG tablet Take 1 tablet (10 mg total) by mouth daily. 30 tablet 0  . lisinopril (PRINIVIL,ZESTRIL) 20 MG tablet Take 0.5 tablets (10 mg total) by mouth daily. 15 tablet 0  . Multiple Vitamins-Minerals (MULTIVITAMIN PO) Take 1 tablet by mouth daily.    . simvastatin (ZOCOR) 20 MG tablet Take 20 mg by mouth daily.     No current facility-administered medications for this visit.     Allergies:   Review of patient's allergies indicates no known allergies.   Social History:  The patient  reports that she quit smoking about 2 years ago. Her smoking use included Cigarettes. She has a 20 pack-year smoking history. She does not have any smokeless tobacco history on file. She reports that she does not drink alcohol or use illicit drugs.   Family History:  The patient's family  history includes Heart attack (age of onset: 6458) in her father; Heart attack (age of onset: 5270) in her mother; Hypertension in her mother; Stroke in her mother.   ROS:  Please see the history of present illness.       All other systems reviewed and negative.    PHYSICAL EXAM: VS:  BP 140/90 mmHg  Pulse 54  Ht 5\' 6"  (1.676 m)  Wt 236 lb 6.4 oz (107.23 kg)  BMI 38.17 kg/m2 Well nourished, well developed, in no acute distress HEENT: normal Neck:   no JVD Cardiac:  normal S1, S2;  RRR; no murmur Lungs:   clear to auscultation bilaterally, no wheezing, rhonchi or rales Abd: soft, nontender, no hepatomegaly Ext:  no edema Skin: warm and dry Neuro:  CNs 2-12 intact, no focal abnormalities noted  EKG:  Sinus brady, HR 54, no ST changes      ASSESSMENT AND PLAN:  1.  Elevated troponin:  Peak Troponin was 5.31.  But, this was in the setting of S. pneumo meningitis.  Echo demonstrated that her LVF is normal and wall motion was normal.  Troponin elevation was 2/2 demand ischemia.  She is not having any current symptoms to suggest angina.  She is still recovering from her illness and remains weak.  I think she will eventually need a stress perfusion study for risk stratification.  However, I do not think this should be pursued at this time.      -  Continue ASA, beta blocker, statin.    -  Consider stress testing at FU visit.  2.  Meningitis due to bacteria:  FU with neurology and PM&R as planned.  3.  Essential hypertension, benign:  BP elevated.    -  Increase Lisinopril to 20 mg QD.    -  Check BMET in 2 weeks. 4.  Hyperlipemia:  Continue statin.   Disposition:   FU with Dr. Verne Carrowhristopher McAlhany or me in 6-8 weeks.    Signed, Brynda RimScott Weaver, PA-C, MHS 03/06/2014 10:51 AM    [facility] [address], [place], [place]  [address] Phone: [phone]; Fax: [phone]

## 2014-03-07 ENCOUNTER — Telehealth: Payer: Self-pay | Admitting: Neurology

## 2014-03-07 ENCOUNTER — Telehealth: Payer: Self-pay | Admitting: *Deleted

## 2014-03-07 ENCOUNTER — Institutional Professional Consult (permissible substitution): Payer: Federal, State, Local not specified - PPO | Admitting: Neurology

## 2014-03-07 NOTE — Telephone Encounter (Signed)
Left message that her appointment today at 1130 was cancelled, and to please call back to confirm she received the message and call to reschedule.

## 2014-03-07 NOTE — Telephone Encounter (Signed)
Noted  

## 2014-03-07 NOTE — Telephone Encounter (Signed)
Call for extension of visit on [name] [name] 2wk1 to follow up pn dynamic mobility and outside ambulation. Approval given.

## 2014-03-07 NOTE — Progress Notes (Signed)
Subjective:    Patient ID: [name] [name], female    DOB: 1950/05/13, 63 y.o.   MRN: [id number]  HPIf/u post hosp 01/18/14 for altered MS sec to [place] meningitis--this about 10 month after pneumovax! Doing well w/ recovery. Energy better each day. Still needs a cane as unsteady. Notes decr hearing on Left since event. Sister-caretaker notes no altered mental status since d/c. Completed rehab speech-now full. Still c/o eye pain L>R w/ reading and will see opthal. Has neuro f/u w/ Dr Anne Hahn this week. Has cardio f/u as well due to elev troponin in hosp but no cardiac problems found-had myoview last yr for fatigue and chest pressure and was judged healthy.   Patient Active Problem List   Diagnosis Date Noted  . Hyperlipemia 10/20/2011    Priority: Medium  . HTN (hypertension) 10/20/2011    Priority: Medium  . BMI 32.0-32.9,adult 10/20/2011    Priority: Medium  . Debility 02/01/2014  . Demand myocardial ischemic related infarction 01/23/2014    Class: Diagnosis of  . Bacterial meningitis-01/18/2014-s.pneumo 01/18/2014  . Depression, reactive 10/12/2012  . Chronic neutropenia 04/14/2012  . Anemia 10/20/2011   Prior to Admission medications   Medication Sig Start Date End Date Taking? Authorizing Provider  aspirin 81 MG chewable tablet Chew 81 mg by mouth daily.   Yes Historical Provider, MD  carvedilol (COREG) 3.125 MG tablet Take 1 tablet (3.125 mg total) by mouth 2 (two) times daily with a meal. 03/06/14 Added after hosp Yes Scott T Weaver, PA-C  escitalopram (LEXAPRO) 10 MG tablet Take 1 tablet (10 mg total) by mouth daily. 02/07/14  Yes Evlyn Kanner Love, PA-C  lisinopril (PRINIVIL,ZESTRIL) 20 MG tablet Take 1 tablet (20 mg total) by mouth daily. 03/06/14  Yes Beatrice Lecher, PA-C  Multiple Vitamins-Minerals (MULTIVITAMIN PO) Take 1 tablet by mouth daily.   Yes Historical Provider, MD  simvastatin (ZOCOR) 20 MG tablet Take 20 mg by mouth daily.   Yes Historical Provider, MD      Review  of Systems  Constitutional: Negative for activity change, fatigue and unexpected weight change.  HENT: Negative for trouble swallowing and voice change.   Cardiovascular: Negative for chest pain, palpitations and leg swelling.  Gastrointestinal: Negative for abdominal pain.  Genitourinary: Negative for difficulty urinating.  Musculoskeletal: Negative for back pain and arthralgias.  Neurological: Negative for dizziness, tremors, speech difficulty and light-headedness.       Has HA around bedtime-bifr--resolves w/ tylen or ibupr  Psychiatric/Behavioral: Negative for sleep disturbance.       Objective:   Physical Exam  Constitutional: She is oriented to person, place, and time. She appears well-developed and well-nourished. No distress.  HENT:  Nose: Nose normal.  Mouth/Throat: Oropharynx is clear and moist.  Right auditory canal clear and tympanic membrane normal Left auditory canal partially occluded by wax and TM appears normal Weber does not lateralize Air conduction greater than bone conduction on the right but there is absent air conduction on the left  She has hyperpigmentation along the lower lip in several spots where she had a herpetic outbreak during the hospitalization  Eyes: Conjunctivae and EOM are normal. Pupils are equal, round, and reactive to light.  Neck: Neck supple. No thyromegaly present.  Cardiovascular: Normal rate, regular rhythm and normal heart sounds.   No murmur heard. Pulmonary/Chest: Effort normal and breath sounds normal.  Musculoskeletal: She exhibits no edema.  Lymphadenopathy:    She has no cervical adenopathy.  Neurological: She is alert and oriented  to person, place, and time. No cranial nerve deficit.  Psychiatric: Her behavior is normal. Thought content normal.  Teary eyed w/ disc of hearing loss   Audiogr=bilat mild loss 2000 on R(good ear) and at least mod loss on L(seems worse on exam) Loss bilt at all freq tho L worse than R        Assessment & Plan:  Eyes/pain w use--to opthal Hearing loss--to ENT Post HSV scaring lower lip-mederma S/p meninigitis--recovered Gait unsteady-cane plus h-cap sticker  F/u Dec for annual exam

## 2014-03-07 NOTE — Telephone Encounter (Signed)
Patient returned call and appointment was rescheduled with Dr. Anne Hahn to 03/14/14 @ 11:30 am.

## 2014-03-07 NOTE — Telephone Encounter (Signed)
Patient calling back to state that she received the message and will call back to reschedule once she contacts her transportation.

## 2014-03-14 ENCOUNTER — Ambulatory Visit (INDEPENDENT_AMBULATORY_CARE_PROVIDER_SITE_OTHER): Payer: Federal, State, Local not specified - PPO | Admitting: Neurology

## 2014-03-14 ENCOUNTER — Encounter: Payer: Self-pay | Admitting: Neurology

## 2014-03-14 VITALS — BP 143/76 | HR 54 | Ht 65.0 in | Wt 239.0 lb

## 2014-03-14 DIAGNOSIS — G009 Bacterial meningitis, unspecified: Secondary | ICD-10-CM

## 2014-03-14 DIAGNOSIS — R413 Other amnesia: Secondary | ICD-10-CM

## 2014-03-14 DIAGNOSIS — R269 Unspecified abnormalities of gait and mobility: Secondary | ICD-10-CM

## 2014-03-14 HISTORY — DX: Unspecified abnormalities of gait and mobility: R26.9

## 2014-03-14 HISTORY — DX: Other amnesia: R41.3

## 2014-03-14 NOTE — Progress Notes (Signed)
Reason for visit: bacterial meningitis  [name] [name] is a 63 y.o. female  History of present illness:  [name] [name] is a 63 year old right-handed black female with a history of an admission to the hospital on 01/18/2014. The patient was found to have streptococcal pneumonia and subsequent meningitis. The patient was treated with IV antibiotics, and she has had a relatively good recovery. The patient did have some confusion for several days following admission. The patient has reported some decrease in hearing in the left ear following admission, and she also has some change in balance. She walks with a cane at this point, but she has not had any falls. The patient also reports some mild memory problems. Her sister indicates that she is having problems with executive function issues, unable to take decisions easily. She is living independently at this time, but she is not yet back to driving a car. The patient denies any problems controlling the bowels or the bladder. She feels somewhat weak in the legs, and she may have intermittent numbness in the legs at times. The patient will have a cramping sensation in the left calf. The patient will be seen through an ENT physician in the near future to evaluate her hearing. She comes to this office for further evaluation.  Past Medical History  Diagnosis Date  . Cataract   . Depression   . Anxiety   . HTN (hypertension)   . Hyperlipemia   . Meningitis due to bacteria 01-18-14  . Gait disorder 03/14/2014  . Memory difficulty 03/14/2014    Past Surgical History  Procedure Laterality Date  . Cyst removal bilateral wrists      Family History  Problem Relation Age of Onset  . Heart attack Mother 63  . Stroke Mother   . Hypertension Mother   . Heart attack Father 35  . Cerebral aneurysm Sister   . Hypertension Sister   . Hypertension Brother   . Rheum arthritis Sister   . Hypertension Brother     Social history:  reports that she quit  smoking about 2 years ago. Her smoking use included Cigarettes. She has a 20 pack-year smoking history. She has never used smokeless tobacco. She reports that she does not drink alcohol or use illicit drugs.  Medications:  Current Outpatient Prescriptions on File Prior to Visit  Medication Sig Dispense Refill  . aspirin 81 MG chewable tablet Chew 81 mg by mouth daily.    . carvedilol (COREG) 3.125 MG tablet Take 1 tablet (3.125 mg total) by mouth 2 (two) times daily with a meal. 60 tablet 11  . escitalopram (LEXAPRO) 10 MG tablet Take 1 tablet (10 mg total) by mouth daily. 30 tablet 0  . lisinopril (PRINIVIL,ZESTRIL) 20 MG tablet Take 1 tablet (20 mg total) by mouth daily. 30 tablet 11  . Multiple Vitamins-Minerals (MULTIVITAMIN PO) Take 1 tablet by mouth daily.    . simvastatin (ZOCOR) 20 MG tablet Take 20 mg by mouth daily.     No current facility-administered medications on file prior to visit.     No Known Allergies  ROS:  Out of a complete 14 system review of symptoms, the patient complains only of the following symptoms, and all other reviewed systems are negative.  Fatigue Hearing loss Itching Joint pain, achy muscles Memory loss, headache, weakness Decreased energy Restless legs  Blood pressure 143/76, pulse 54, height 5\' 5"  (1.651 m), weight 239 lb (108.41 kg).  Physical Exam  General: The patient is  alert and cooperative at the time of the examination. The patient is minimally to moderately obese.  Eyes: Pupils are equal, round, and reactive to light. Discs are flat bilaterally.  Neck: The neck is supple, no carotid bruits are noted.  Respiratory: The respiratory examination is clear.  Cardiovascular: The cardiovascular examination reveals a regular rate and rhythm, no obvious murmurs or rubs are noted.  Skin: Extremities are without significant edema.  Neurologic Exam  Mental status: The patient is alert and oriented x 3 at the time of the examination. The  patient has apparent normal recent and remote memory, with an apparently normal attention span and concentration ability. Mini-Mental status examination done today shows a total score 29/30. Animal fluency test score is 15.  Cranial nerves: Facial symmetry is present. There is good sensation of the face to pinprick and soft touch bilaterally. The strength of the facial muscles and the muscles to head turning and shoulder shrug are normal bilaterally. Speech is well enunciated, no aphasia or dysarthria is noted. Extraocular movements are full. Visual fields are full. The tongue is midline, and the patient has symmetric elevation of the soft palate. No obvious hearing deficits are noted.  Motor: The motor testing reveals 5 over 5 strength of all 4 extremities. Good symmetric motor tone is noted throughout.  Sensory: Sensory testing is intact to pinprick, soft touch, vibration sensation, and position sense on all 4 extremities. No evidence of extinction is noted.  Coordination: Cerebellar testing reveals good finger-nose-finger and heel-to-shin bilaterally.  Gait and station: Gait is normal. Tandem gait is slightly unsteady. Romberg is negative. No drift is seen.  Reflexes: Deep tendon reflexes are symmetric and normal bilaterally. Toes are downgoing bilaterally.   MRI brain 01/27/14:  IMPRESSION: Findings consistent with known meningitis. Scattered foci of diffusion restriction are stable to slightly decreased in conspicuity. No evidence of acute infarct or brain abscess.   Assessment/Plan:  1. History of bacterial meningitis   The patient has done exceedingly well following the episode of meningitis. The patient reports some mild memory and cognitive issues. We will follow this over the next several months. The patient indicates that the headaches have now gone away. She is left with some minimal gait instability. She has completed physical therapy, but she is not yet back to operating a  motor vehicle. The patient will follow-up in 4 months.  Marlan Palau. Keith Mozes Sagar MD 03/14/2014 7:24 PM  [facility] [address] [place], [place] [address]  Phone [phone] Fax [phone]

## 2014-03-14 NOTE — Patient Instructions (Signed)
Bacterial Meningitis °Meningitis is an inflammation of the fluid and membranes surrounding the spinal cord and brain. It is sometimes referred to as spinal meningitis. Bacterial meningitis is caused by a bacterial infection. It is important to know whether a virus or bacterium is causing your meningitis. This is because the severity of illness and the treatment differ. Viral meningitis is generally less severe and can go away without specific treatment. Bacterial meningitis is treated with antibiotics. Meningitis can also be caused by fungi, parasites, certain medicines, cancer, and autoimmune disorders. °People with lowered immune systems are at greater risk for poor outcomes from meningitis. It is important to get treatment as soon as possible to minimize the impact of a meningitis infection. Long-term complications could include seizures, hearing loss, weakness, paralysis, blindness, or cognitive impairment. °CAUSES °Bacterial meningitis occurs when certain bacteria reach the membranes covering the brain. These bacteria may enter the bloodstream and travel to the brain. They may also reach the brain directly through the nasal cavity or through an open wound in the skull. It is important to know which type of bacteria is causing the meningitis. Antibiotics can prevent some types of meningitis from spreading and infecting other people. °The types of bacteria that commonly cause meningitis vary by age group. Common causes by age group are listed below. °· Premature babies and newborns up to 3 months: °¨ Group B streptococcus, which exists in the vaginal tract. °¨ Escherichia coli, which exists in the digestive tract. °¨ Listeria monocytogenes. °· Older children: °¨ Neisseria meningitidis, often referred to as meningococcus. °¨ Streptococcus pneumoniae. °¨ Haemophilus influenzae type B, in children under 5 years old. °· Adults: °¨ Neisseria meningitidis. °¨ Streptococcus pneumoniae. °¨ Listeria monocytogenes, in  adults over 50 years old. °People who have a cerebral shunt, cochlear implant, or any similar device are at increased risk of infection. Rarely, an infection in the head and neck area (such as otitis media or mastoiditis) can lead to meningitis. Tuberculous meningitis is caused by an infection with Mycobacterium tuberculosis. This type of meningitis is more common in countries where tuberculosis is common. It may also occur in people with immune system problems, such as AIDS.  °SYMPTOMS  °Symptoms can develop over several hours. They may even take 1 to 2 days to develop. Common symptoms of meningitis in people over the age of 2 years include: °· High fever. °· Headache. °· Stiff neck. °· Irritability. °· Nausea and vomiting. °· Decreased appetite. °· Fatigue. °· Discomfort from exposure to light. °· Discomfort from exposure to loud noise. °· Trouble walking. °· Altered mental status. °· Seizures. °A rash may occur in severe cases of meningitis. Meningitis caused by the bacterium Neisseria meningitidis (known as "meningococcal meningitis") may cause a rapidly spreading rash, which appears before other symptoms. The rash consists of many small, irregular purple or red spots (petechiae) on the trunk, lower extremities, mucous membranes, conjunctiva of the eye, and sometimes on the palms of the hands or soles of the feet. °DIAGNOSIS  °Early diagnosis and treatment are very important. If you have symptoms, you should see your caregiver right away. Your evaluation may include lab tests of your blood and spinal fluid. A spinal fluid sample is taken through a procedure called a lumbar puncture or spinal tap. During the procedure, a needle is inserted into an area in the lower back. The fluid is examined and cultures are done. The type of bacteria causing the infection will be identified. You may also have a CT scan of   your brain as part of your evaluation. If there is suspicion of an infection or inflammation of the brain  (encephalitis), an MRI scan may be done. °PREVENTION °Bacterial meningitis is contagious. The bacteria spread from person to person in secretions from the lungs and throat. This may happen when you cough, sneeze, or kiss someone. None of the bacteria that cause meningitis are as contagious as the common cold or the flu. The bacteria are not spread by simply breathing the same air as a person who has meningitis. °The bacteria that cause meningitis can spread through close or prolonged contact with a patient who has certain types of bacterial meningitis. People who are at high risk should receive antibiotics to help prevent them from getting the disease. People at increased risk of getting the infection include: °· Health care workers. °· People who share a household with the patient. °· Day care center workers. °· Anyone who has direct contact with the patient's oral secretions. °Some forms of meningitis (such as those associated with meningococcus,  Haemophilus influenzae type B, pneumococci, or mumps virus infections) may be prevented by immunization. Talk to your caregiver about specific vaccines that are recommended for adults and children.  °TREATMENT  °Bacterial meningitis can be treated with many antibiotics. Treatment must be started early in the course of the disease. Antibiotics must be started immediately, even before the exact cause is determined. You will be given specific antibiotics once the exact cause is known. Steroids may also be used to limit swelling of the brain. °HOME CARE INSTRUCTIONS  °· Rest. °· Drink enough water and fluids to keep your urine clear or pale yellow. °· Take all medicine as prescribed. °· Practice good hygiene to prevent others from getting sick. °· Follow up with your caregiver as directed. °SEEK IMMEDIATE MEDICAL CARE IF:  °· You develop a high fever, neck stiffness, or confusion. °· You have a headache that becomes severe or does not respond to pain medicine. °· You develop  dizziness. °· You have a fast heartbeat. °· You have a seizure. °· You develop a rash. °· You are taking antibiotics and are not getting better. °· You develop severe vomiting or are unable to tolerate food or fluids. °· You have any worsening symptoms. °MAKE SURE YOU:  °· Understand these instructions. °· Will watch your condition. °· Will get help right away if you are not doing well or get worse. °Document Released: 06/28/2001 Document Revised: 09/03/2013 Document Reviewed: 08/05/2010 °ExitCare® Patient Information ©2015 [employer]. This information is not intended to replace advice given to you by your health care provider. Make sure you discuss any questions you have with your health care provider. ° °

## 2014-03-15 ENCOUNTER — Encounter
Payer: Federal, State, Local not specified - PPO | Attending: Physical Medicine & Rehabilitation | Admitting: Physical Medicine & Rehabilitation

## 2014-03-15 ENCOUNTER — Encounter: Payer: Self-pay | Admitting: Physical Medicine & Rehabilitation

## 2014-03-15 VITALS — BP 146/78 | HR 68 | Resp 14 | Ht 66.0 in | Wt 237.0 lb

## 2014-03-15 DIAGNOSIS — H9193 Unspecified hearing loss, bilateral: Secondary | ICD-10-CM | POA: Insufficient documentation

## 2014-03-15 DIAGNOSIS — F419 Anxiety disorder, unspecified: Secondary | ICD-10-CM | POA: Diagnosis not present

## 2014-03-15 DIAGNOSIS — I10 Essential (primary) hypertension: Secondary | ICD-10-CM | POA: Diagnosis not present

## 2014-03-15 DIAGNOSIS — G009 Bacterial meningitis, unspecified: Secondary | ICD-10-CM | POA: Diagnosis not present

## 2014-03-15 DIAGNOSIS — B948 Sequelae of other specified infectious and parasitic diseases: Secondary | ICD-10-CM | POA: Insufficient documentation

## 2014-03-15 DIAGNOSIS — F411 Generalized anxiety disorder: Secondary | ICD-10-CM | POA: Diagnosis not present

## 2014-03-15 NOTE — Progress Notes (Signed)
Subjective:    Patient ID: [name] [name], female    DOB: January 18, 1951, 63 y.o.   MRN: [id number]  HPI   [name] [name] is back regarding her meningitis and associated functional deficits. She is having some occasional anxiety.    She apparently suffered hearing loss from the meningitis, left more than right. She can't hear thru the left ear while on the phone. She can carry on conversations without issue.   Her sleep is improving. The pressure behind her eyes has improved.   Her balance is "off". She thinks it's from her ears.   Anxiety has been a problems still as well, but was an issue prior to her hospitalization.    Pain Inventory Average Pain 1 Pain Right Now 1 My pain is intermittent and dull  In the last 24 hours, has pain interfered with the following? General activity 0 Relation with others 0 Enjoyment of life 0 What TIME of day is your pain at its worst? morning Sleep (in general) Good  Pain is worse with: bending Pain improves with: . Relief from Meds: 1  Mobility use a cane ability to climb steps?  yes do you drive?  no  Function disabled: date disabled .  Neuro/Psych anxiety  Prior Studies Any changes since last visit?  no  Physicians involved in your care Any changes since last visit?  no   Family History  Problem Relation Age of Onset  . Heart attack Mother [id number]  . Stroke Mother   . Hypertension Mother   . Heart attack Father [id number]  . Cerebral aneurysm Sister   . Hypertension Sister   . Hypertension Brother   . Rheum arthritis Sister   . Hypertension Brother    History   Social History  . Marital Status: Divorced    Spouse Name: N/A    Number of Children: 1  . Years of Education: college   Occupational History  . Retired [employer]    Social History Main Topics  . Smoking status: Former Smoker -- 0.50 packs/day for 40 years    Types: Cigarettes    Quit date: 07/09/2011  . Smokeless tobacco: Never Used  . Alcohol Use: No  . Drug Use:  No  . Sexual Activity: No   Other Topics Concern  . None   Social History Narrative   Patient is single lives at home alone, has 1 child   Patient is right handed   Education level is 1 year of college   Caffeine consumption is 3-4 cups daily   Past Surgical History  Procedure Laterality Date  . Cyst removal bilateral wrists     Past Medical History  Diagnosis Date  . Cataract   . Depression   . Anxiety   . HTN (hypertension)   . Hyperlipemia   . Meningitis due to bacteria 01-18-14  . Gait disorder 03/14/2014  . Memory difficulty 03/14/2014   BP 146/78 mmHg  Pulse 68  Resp 14  Ht 5\' 6"  (1.676 m)  Wt 237 lb (107.502 kg)  BMI 38.27 kg/m2  SpO2 98%  Opioid Risk Score:   Fall Risk Score: Low Fall Risk (0-5 points)   Review of Systems  HENT:       Hearing loss, bilateral  Eyes: Negative.   Respiratory: Negative.   Cardiovascular: Negative.   Gastrointestinal: Negative.   Endocrine: Negative.   Genitourinary: Negative.   Musculoskeletal: Negative.   Skin: Negative.   Allergic/Immunologic: Negative.   Neurological: Negative.   Hematological:  Negative.   Psychiatric/Behavioral: The patient is nervous/anxious.        Objective:   Physical Exam   Constitutional: She is oriented to person, place, and time. She appears well-developed. HENT: oral mucosa pink and moist. Lesions on lips resolving Head: Normocephalic and atraumatic.  Eyes: Conjunctivae are normal. Pupils are equal, round, and reactive to light. Anicteric. Visual acuity intact Neck: Normal range of motion. Neck supple.  Cardiovascular: Normal rate and regular rhythm.  Respiratory: Effort normal and breath sounds normal. No respiratory distress. She has no wheezes.  GI: Soft. Bowel sounds are normal. She exhibits no distension. There is no tenderness.  Musculoskeletal: She exhibits no edema and no tenderness.  Neurological: She is alert and oriented to person, place, and time.  Speech  normal.ambulated with a shuffling gait and head forward position initially which corrected with  Verbal cueing.   motor strength is 5/5 bilateral deltoid, biceps, triceps, grip. 4 minus bilateral hip flexors, 4 to 4+ knee extensors ankle dorsiflexors and plantar flexors  Sensation intact to light touch bilateral upper and lower limbs  Deep tendon reflexes 2+ bilateral biceps, triceps, brachioradialis, patellar, 1+ at the ankles  Skin: Skin is warm and dry.   Assessment/Plan: 1. Functional deficits secondary to Bacterial meningitis with decreased balance and LE strength.   -may return to driving but I want her to go through a step-wise acclimation process with family  -discussed posture and proper gait technique 2. Anxiety:  Lexapro. Still has baseline anxiety which she needs to work through.  -asked her to take some chances and return to the community/church/leisure activities, etc 3. HTN: per PCP     Follow up with me PRN.

## 2014-03-15 NOTE — Patient Instructions (Signed)
PLEASE CALL ME WITH ANY PROBLEMS OR QUESTIONS (#[phone]).     RETURN TO DRIVING PLAN:  WITH THE SUPERVISION OF A LICENSED DRIVER, PLEASE DRIVE IN AN EMPTY PARKING LOT FOR AT LEAST 2-3 TRIALS TO TEST REACTION TIME, VISION, USE OF EQUIPMENT IN CAR, ETC.  IF SUCCESSFUL WITH THE PARKING LOT DRIVING, PROCEED TO SUPERVISED DRIVING TRIALS IN YOUR NEIGHBORHOOD STREETS AT LOW TRAFFIC TIMES TO TEST OBSERVATION TO TRAFFIC SIGNALS, REACTION TIME, ETC. PLEASE ATTEMPT AT LEAST 2-3 TRIALS IN YOUR NEIGHBORHOOD.  IF NEIGHBORHOOD DRIVING IS SUCCESSFUL, YOU MAY PROCEED TO DRIVING IN BUSIER AREAS IN YOUR COMMUNITY WITH SUPERVISION OF A LICENSED DRIVER. PLEASE ATTEMPT AT LEAST 4-5 TRIALS.  IF COMMUNITY DRIVING IS SUCCESSFUL, YOU MAY PROCEED TO DRIVING ALONE, DURING THE DAY TIME, IN NON-PEAK TRAFFIC TIMES. YOU SHOULD DRIVE NO FURTHER THAN 30 MINUTES IN ONE DIRECTION. PLEASE DO NOT DRIVE IF YOU FEEL FATIGUED OR UNDER THE INFLUENCE OF MEDICATION.

## 2014-03-20 ENCOUNTER — Other Ambulatory Visit (INDEPENDENT_AMBULATORY_CARE_PROVIDER_SITE_OTHER): Payer: Federal, State, Local not specified - PPO | Admitting: *Deleted

## 2014-03-20 DIAGNOSIS — I10 Essential (primary) hypertension: Secondary | ICD-10-CM

## 2014-03-20 LAB — BASIC METABOLIC PANEL
BUN: 22 mg/dL (ref 6–23)
CHLORIDE: 107 meq/L (ref 96–112)
CO2: 27 mEq/L (ref 19–32)
Calcium: 9.1 mg/dL (ref 8.4–10.5)
Creatinine, Ser: 0.9 mg/dL (ref 0.4–1.2)
GFR: 84.57 mL/min (ref >60.00)
Glucose, Bld: 81 mg/dL (ref 70–99)
POTASSIUM: 4 meq/L (ref 3.5–5.1)
SODIUM: 140 meq/L (ref 135–145)

## 2014-03-21 ENCOUNTER — Telehealth: Payer: Self-pay | Admitting: *Deleted

## 2014-03-21 DIAGNOSIS — D510 Vitamin B12 deficiency anemia due to intrinsic factor deficiency: Secondary | ICD-10-CM | POA: Diagnosis not present

## 2014-03-21 DIAGNOSIS — H409 Unspecified glaucoma: Secondary | ICD-10-CM

## 2014-03-21 DIAGNOSIS — R269 Unspecified abnormalities of gait and mobility: Secondary | ICD-10-CM | POA: Diagnosis not present

## 2014-03-21 DIAGNOSIS — M8580 Other specified disorders of bone density and structure, unspecified site: Secondary | ICD-10-CM | POA: Diagnosis not present

## 2014-03-21 DIAGNOSIS — G309 Alzheimer's disease, unspecified: Secondary | ICD-10-CM | POA: Diagnosis not present

## 2014-03-21 NOTE — Telephone Encounter (Signed)
pt notified about lab results with verbal understanding. I wished pt Happy Birthday today.

## 2014-04-04 ENCOUNTER — Telehealth: Payer: Self-pay | Admitting: *Deleted

## 2014-04-04 NOTE — Telephone Encounter (Signed)
Patient is request a proof of discharge for her dentist Dr. Durene Cal DDS.  Please fax to his office [phone]

## 2014-04-05 ENCOUNTER — Telehealth: Payer: Self-pay | Admitting: *Deleted

## 2014-04-05 NOTE — Telephone Encounter (Signed)
Called patient and told her that I did fax the letter to her dentist - Dr. Durene Cal DDS... And that she is released.  Called Dentist to confirm receipt of Fax - left message

## 2014-04-17 ENCOUNTER — Ambulatory Visit (INDEPENDENT_AMBULATORY_CARE_PROVIDER_SITE_OTHER): Payer: Federal, State, Local not specified - PPO | Admitting: Internal Medicine

## 2014-04-17 ENCOUNTER — Encounter: Payer: Self-pay | Admitting: Internal Medicine

## 2014-04-17 VITALS — BP 144/80 | HR 64 | Temp 98.1°F | Resp 18 | Ht 65.0 in | Wt 244.0 lb

## 2014-04-17 DIAGNOSIS — R5381 Other malaise: Secondary | ICD-10-CM

## 2014-04-17 DIAGNOSIS — F329 Major depressive disorder, single episode, unspecified: Secondary | ICD-10-CM

## 2014-04-17 DIAGNOSIS — I10 Essential (primary) hypertension: Secondary | ICD-10-CM

## 2014-04-17 DIAGNOSIS — H905 Unspecified sensorineural hearing loss: Secondary | ICD-10-CM

## 2014-04-17 DIAGNOSIS — Z0000 Encounter for general adult medical examination without abnormal findings: Secondary | ICD-10-CM

## 2014-04-17 DIAGNOSIS — R739 Hyperglycemia, unspecified: Secondary | ICD-10-CM

## 2014-04-17 DIAGNOSIS — R5383 Other fatigue: Secondary | ICD-10-CM

## 2014-04-17 DIAGNOSIS — E785 Hyperlipidemia, unspecified: Secondary | ICD-10-CM

## 2014-04-17 DIAGNOSIS — R4701 Aphasia: Secondary | ICD-10-CM

## 2014-04-17 DIAGNOSIS — M79672 Pain in left foot: Secondary | ICD-10-CM

## 2014-04-17 DIAGNOSIS — M79671 Pain in right foot: Secondary | ICD-10-CM

## 2014-04-17 DIAGNOSIS — Z23 Encounter for immunization: Secondary | ICD-10-CM

## 2014-04-17 DIAGNOSIS — F341 Dysthymic disorder: Secondary | ICD-10-CM

## 2014-04-17 LAB — CBC WITH DIFFERENTIAL/PLATELET
BASOS PCT: 0 % (ref 0–1)
Basophils Absolute: 0 10*3/uL (ref 0.0–0.1)
Eosinophils Absolute: 0.1 10*3/uL (ref 0.0–0.7)
Eosinophils Relative: 2 % (ref 0–5)
HEMATOCRIT: 37.7 % (ref 36.0–46.0)
HEMOGLOBIN: 12.1 g/dL (ref 12.0–15.0)
LYMPHS ABS: 1.5 10*3/uL (ref 0.7–4.0)
Lymphocytes Relative: 37 % (ref 12–46)
MCH: 25.5 pg — ABNORMAL LOW (ref 26.0–34.0)
MCHC: 32.1 g/dL (ref 30.0–36.0)
MCV: 79.4 fL (ref 78.0–100.0)
MONO ABS: 0.4 10*3/uL (ref 0.1–1.0)
MPV: 10.1 fL (ref 9.4–12.4)
Monocytes Relative: 10 % (ref 3–12)
NEUTROS ABS: 2 10*3/uL (ref 1.7–7.7)
NEUTROS PCT: 51 % (ref 43–77)
Platelets: 301 10*3/uL (ref 150–400)
RBC: 4.75 MIL/uL (ref 3.87–5.11)
RDW: 14.5 % (ref 11.5–15.5)
WBC: 4 10*3/uL (ref 4.0–10.5)

## 2014-04-17 MED ORDER — ESCITALOPRAM OXALATE 20 MG PO TABS: 10.0000 mg | ORAL_TABLET | Freq: Every day | ORAL | Status: DC

## 2014-04-17 MED ORDER — CARVEDILOL 3.125 MG PO TABS: 3.1250 mg | ORAL_TABLET | Freq: Two times a day (BID) | ORAL | Status: DC

## 2014-04-17 MED ORDER — LISINOPRIL 20 MG PO TABS: 20.0000 mg | ORAL_TABLET | Freq: Every day | ORAL | Status: DC

## 2014-04-17 MED ORDER — SIMVASTATIN 20 MG PO TABS: 20.0000 mg | ORAL_TABLET | Freq: Every day | ORAL | Status: DC

## 2014-04-17 NOTE — Patient Instructions (Addendum)
Pneumococcal Conjugate Vaccine: What You Need to Know  Your doctor recommends that you, or your child, get a dose of PCV13 today.  1. Why get vaccinated?  Pneumococcal conjugate vaccine (called PCV13 or Prevnar 13) is recommended to protect infants and toddlers, and some older children and adults with certain health conditions, from pneumococcal disease.  Pneumococcal disease is caused by infection with Streptococcus pneumoniae bacteria. These bacteria can spread from person to person through close contact.  Pneumococcal disease can lead to severe health problems, including pneumonia, blood infections, and meningitis.  Meningitis is an infection of the covering of the brain. Pneumococcal meningitis is fairly rare (less than 1 case per 100,000 people each year), but it leads to other health problems, including deafness and brain damage. In children, it is fatal in about 1 case out of 10.  Children younger than two are at higher risk for serious disease than older children.  People with certain medical conditions, people over age 65, and cigarette smokers are also at higher risk.  Before vaccine, pneumococcal infections caused many problems each year in the [place] in children younger than 5, including:  · more than 700 cases of meningitis,  · 13,000 blood infections,  · about 5 million ear infections, and  · about 200 deaths.  About 4,000 adults still die each year because of pneumococcal infections.  Pneumococcal infections can be hard to treat because some strains are resistant to antibiotics. This makes prevention through vaccination even more important.  2. PCV13 vaccine  There are more than 90 types of pneumococcal bacteria. PCV13 protects against 13 of them. These 13 strains cause most severe infections in children and about half of infections in adults.   PCV13 is routinely given to children at 2, 4, 6, and 12-15 months of age. Children in this age range are at greatest risk for serious diseases caused  by pneumococcal infection.  PCV13 vaccine may also be recommended for some older children or adults. Your doctor can give you details.  A second type of pneumococcal vaccine, called PPSV23, may also be given to some children and adults, including anyone over age 65. There is a separate Vaccine Information Statement for this vaccine.  3. Precautions   Anyone who has ever had a life-threatening allergic reaction to a dose of this vaccine, to an earlier pneumococcal vaccine called PCV7 (or Prevnar), or to any vaccine containing diphtheria toxoid (for example, DTaP), should not get PCV13.  Anyone with a severe allergy to any component of PCV13 should not get the vaccine. Tell your doctor if the person being vaccinated has any severe allergies.  If the person scheduled for vaccination is sick, your doctor might decide to reschedule the shot on another day.  Your doctor can give you more information about any of these precautions.  4. What are the risks of PCV13 vaccine?   With any medicine, including vaccines, there is a chance of side effects. These are usually mild and go away on their own, but serious reactions are also possible.  Reported problems associated with PCV13 vary by dose and age, but generally:  · About half of children became drowsy after the shot, had a temporary loss of appetite, or had redness or tenderness where the shot was given.  · About 1 out of 3 had swelling where the shot was given.  · About 1 out of 3 had a mild fever, and about 1 in 20 had a higher fever (over 102.2°F).  ·   Up to about 8 out of 10 became fussy or irritable.  Adults receiving the vaccine have reported redness, pain, and swelling where the shot was given. Mild fever, fatigue, headache, chills, or muscle pain have also been reported.  Life-threatening allergic reactions from any vaccine are very rare.  5. What if there is a serious reaction?  What should I look for?  · Look for anything that concerns you, such as signs of a  severe allergic reaction, very high fever, or behavior changes.  Signs of a severe allergic reaction can include hives, swelling of the face and throat, difficulty breathing, a fast heartbeat, dizziness, and weakness. These would start a few minutes to a few hours after the vaccination.  What should I do?  · If you think it is a severe allergic reaction or other emergency that can't wait, call 9-1-1 or get the person to the nearest hospital. Otherwise, call your doctor.  · Afterward, the reaction should be reported to the Vaccine Adverse Event Reporting System (VAERS). Your doctor might file this report, or you can do it yourself through the VAERS web site at www.vaers.hhs.gov, or by calling 1-800-822-7967.  VAERS is only for reporting reactions. They do not give medical advice.  6. The National Vaccine Injury Compensation Program  The National Vaccine Injury Compensation Program (VICP) is a federal program that was created to compensate people who may have been injured by certain vaccines.  Persons who believe they may have been injured by a vaccine can learn about the program and about filing a claim by calling 1-800-338-2382 or visiting the VICP website at www.hrsa.gov/vaccinecompensation.  7. How can I learn more?  · Ask your doctor.  · Call your local or state health department.  · Contact the Centers for Disease Control and Prevention (CDC):  ¨ Call 1-800-232-4636 (1-800-CDC-INFO) or  ¨ Visit CDC's website at www.cdc.gov/vaccines  CDC PCV13 Vaccine VIS (Interim) (06/30/11)  Document Released: 02/14/2006 Document Revised: 09/03/2013 Document Reviewed: 06/08/2013  Exit[employer]® Patient Information ©2015 [employer]. This information is not intended to replace advice given to you by your health care provider. Make sure you discuss any questions you have with your health care provider.

## 2014-04-17 NOTE — Progress Notes (Signed)
MRN: [id number] DOB: 07-26-1950  Subjective:   [name] [name] is a 63 y.o. female presenting for annual physical examination and follow-up of medical problems. She was last here posthospitalization following an episode of pneumococcal meningitis. She lost hearing in her left ear-see Dr. Allene Pyo evaluation. She had confusion and mental changes would have which have not resolved-see recent neurological evaluation. She has unsteadiness of gait-see recent rehabilitation evaluation.  She states that she is concerned of her weight gain.  She believes this was due to a change of diet when sister moved in with her, to help with her meningitis recovery.  Since that time, she is eating breads, potatoes, and sweets.   She also complains of leg pain, ankle swelling.  This is aggravated by walking, but she feels it at night as well.  The pain is more at the soles of her feet, and pronounced at her right heel.  She has attempted to use sneakers, which help in the beginning, but pain returns if she is ambulating for a long period.  She denies a numbness, tingling, or burning, and says its just "pain".  She puts salve Deep Blue on the bottoms of her feet for relief.  She believes this pain started after her hospitalization about 3 months ago.    She states that she has not had any more difficulty with word-finding--this has improved.  She continues to have anxiety in groups and prefers to stay home.  She has restarted driving as off today, and there is anxiety there.  She has noticed that her anxiety has been increased, and there is fear of imbalance and falls.  She no longer has home therapy, and believes that she did not have enough treatment. She feels very frail and vulnerable to reinjury. She is worried about recurrent sudden meningitis. 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     Past Medical History  Diagnosis Date  . Cataract   . Depression   . Anxiety   . HTN  (hypertension)   . Hyperlipemia   . Meningitis due to bacteria 01-18-14  . Gait disorder 03/14/2014  . Memory difficulty 03/14/2014   Current Outpatient Prescriptions on File Prior to Visit  Medication Sig Dispense Refill  . aspirin 81 MG chewable tablet Chew 81 mg by mouth daily.    . carvedilol (COREG) 3.125 MG tablet Take 1 tablet (3.125 mg total) by mouth 2 (two) times daily with a meal. 60 tablet 11  . escitalopram (LEXAPRO) 10 MG tablet Take 1 tablet (10 mg total) by mouth daily. 30 tablet 0  . lisinopril (PRINIVIL,ZESTRIL) 20 MG tablet Take 1 tablet (20 mg total) by mouth daily. 30 tablet 11  . Multiple Vitamins-Minerals (MULTIVITAMIN PO) Take 1 tablet by mouth daily.    . simvastatin (ZOCOR) 20 MG tablet Take 20 mg by mouth daily.     No current facility-administered medications on file prior to visit.    Family History  Problem Relation Age of Onset  . Heart attack Mother 56  . Stroke Mother   . Hypertension Mother   . Heart attack Father 77  . Cerebral aneurysm Sister   . Hypertension Sister   . Hypertension Brother   . Rheum arthritis Sister   . Hypertension Brother    History   Social History  . Marital Status: Divorced    Spouse Name: N/A    Number of Children: 1  . Years of Education: college  Occupational History  . Retired [employer]    Social History Main Topics  . Smoking status: Former Smoker -- 0.50 packs/day for 40 years    Types: Cigarettes    Quit date: 07/09/2011  . Smokeless tobacco: Never Used  . Alcohol Use: No  . Drug Use: No  . Sexual Activity: No   Other Topics Concern  . None   Social History Narrative   Patient is single lives at home alone, has 1 child   Patient is right handed   Education level is 1 year of college   Caffeine consumption is 3-4 cups daily     Morrissa has a current medication list which includes the following prescription(s): aspirin, carvedilol, escitalopram, lisinopril, multiple vitamins-minerals, and  simvastatin.  She has No Known Allergies.  Kyrene  has a past medical history of Cataract; Depression; Anxiety; HTN (hypertension); Hyperlipemia; Meningitis due to bacteria (01-18-14); Gait disorder (03/14/2014); and Memory difficulty (03/14/2014). Also  has past surgical history that includes Cyst removal bilateral wrists.  ROS As in subjective. Remainder of the 14 point review of systems is negative  Objective:   Vitals: BP 164/79 mmHg  Pulse 64  Temp(Src) 98.1 F (36.7 C)  Resp 18  Ht 5\' 5"  (1.651 m)  Wt 244 lb (110.678 kg)  BMI 40.60 kg/m2  SpO2 98%  Physical Exam  Constitutional: She is oriented to person, place, and time and well-developed, well-nourished, and in no distress.  She is mildly anxious and occasionally tearful during this exam  HENT:  Head: Normocephalic and atraumatic.  Right Ear: External ear normal.  Left Ear: External ear normal.  Nose: Nose normal.  Mouth/Throat: Oropharynx is clear and moist.  Eyes: Conjunctivae and EOM are normal. Pupils are equal, round, and reactive to light.  Neck: Normal range of motion. Neck supple. No thyromegaly present.  Cardiovascular: Normal rate, regular rhythm, normal heart sounds and intact distal pulses.   No murmur heard. Pulmonary/Chest: Effort normal and breath sounds normal. No respiratory distress.  Abdominal: Soft. Bowel sounds are normal. She exhibits no distension and no mass. There is no tenderness.  Musculoskeletal: Normal range of motion. She exhibits no edema or tenderness.  Sensation intact at the plantar aspect of each foot. She is tender to palpation under the right heel and along the plantar fossa nearby without swelling or redness-dorsiflexion of the foot is okay The left heel is also slightly tender to squeeze without specific tenderness in the plantar 5-. Once again there is no swelling   Lymphadenopathy:    She has no cervical adenopathy.  Neurological: She is alert and oriented to person, place, and  time. She has normal reflexes. No cranial nerve deficit.  Her gait is slow but steady--she has a cane  Psychiatric:  It is concerning that her level of anxiety seems to be inhibiting any decision making about pursuing activities. She voices the fear to decide plan something because she might not be still alive. She is not enjoying herself and is not able to participate in activities that she used to enjoy. This is affecting her judgment. Her memory appears intact this point.  Vitals reviewed.   No results found for this or any previous visit (from the past 24 hour(s)).   Assessment and Plan :  63 year old female with PMH listed above is here today for a complete physical exam. Hypertension--exacerbated by anxiety//no change in medications  Physical Deconditioning--she is easily fatigued activity. She is afraid to try things that she feels  frail and weak. She has gained weight due to eating and activity. We need to be very aggressive at restoring muscular strength and ability to be active and I'll refer her to physical therapy  Depression, reactive with Anxiety--increase Lexapro to 20 mg Discussed outside activities Discussed the possibility of neurocognitive rehabilitation if she is unable to resume her reading  Hyperlipidemia  Lipid panel  Aphasia-resolving although she still seems to have some mild cognitive defects  Foot pain, bilateral--unclear etiology but curing this pain is necessary to allow her return to full activity both for physical conditioning and weight loss Recommended patient to podiatry -Referring to physical therapy for general deconditioning  Hyperglycemia with recent weight gain Hemoglobin A1c We discussed the dietary improvement  Other fatigue  CBC with Differential, Comprehensive metabolic panel, TSH  482mos-3mos   Trena PlattStephanie English, PA-C,[facility] Assisted with this examination  RPDoolittle,  MD 12/16/20151:50 PM

## 2014-04-18 ENCOUNTER — Encounter: Payer: Self-pay | Admitting: Physician Assistant

## 2014-04-18 ENCOUNTER — Ambulatory Visit (INDEPENDENT_AMBULATORY_CARE_PROVIDER_SITE_OTHER): Payer: Federal, State, Local not specified - PPO | Admitting: Physician Assistant

## 2014-04-18 VITALS — BP 150/80 | HR 71 | Ht 65.0 in | Wt 246.0 lb

## 2014-04-18 DIAGNOSIS — R778 Other specified abnormalities of plasma proteins: Secondary | ICD-10-CM

## 2014-04-18 DIAGNOSIS — R7989 Other specified abnormal findings of blood chemistry: Secondary | ICD-10-CM

## 2014-04-18 DIAGNOSIS — I10 Essential (primary) hypertension: Secondary | ICD-10-CM

## 2014-04-18 DIAGNOSIS — G009 Bacterial meningitis, unspecified: Secondary | ICD-10-CM

## 2014-04-18 DIAGNOSIS — E785 Hyperlipidemia, unspecified: Secondary | ICD-10-CM

## 2014-04-18 LAB — COMPREHENSIVE METABOLIC PANEL
ALK PHOS: 94 U/L (ref 39–117)
ALT: 43 U/L — ABNORMAL HIGH (ref 0–35)
AST: 36 U/L (ref 0–37)
Albumin: 4.1 g/dL (ref 3.5–5.2)
BUN: 15 mg/dL (ref 6–23)
CALCIUM: 9.9 mg/dL (ref 8.4–10.5)
CO2: 28 mEq/L (ref 19–32)
Chloride: 104 mEq/L (ref 96–112)
Creat: 0.74 mg/dL (ref 0.50–1.10)
Glucose, Bld: 91 mg/dL (ref 70–99)
POTASSIUM: 4.9 meq/L (ref 3.5–5.3)
SODIUM: 139 meq/L (ref 135–145)
TOTAL PROTEIN: 7 g/dL (ref 6.0–8.3)
Total Bilirubin: 0.5 mg/dL (ref 0.2–1.2)

## 2014-04-18 LAB — LIPID PANEL
Cholesterol: 181 mg/dL (ref 0–200)
HDL: 79 mg/dL (ref >39)
LDL CALC: 84 mg/dL (ref 0–99)
TRIGLYCERIDES: 88 mg/dL (ref <150)
Total CHOL/HDL Ratio: 2.3 Ratio
VLDL: 18 mg/dL (ref 0–40)

## 2014-04-18 LAB — TSH: TSH: 1.561 u[IU]/mL (ref 0.350–4.500)

## 2014-04-18 LAB — HEMOGLOBIN A1C
Hgb A1c MFr Bld: 6.1 % — ABNORMAL HIGH (ref <5.7)
MEAN PLASMA GLUCOSE: 128 mg/dL — AB (ref <117)

## 2014-04-18 NOTE — Progress Notes (Signed)
Cardiology Office Note   Date:  04/18/2014   ID:  [name] [name], DOB [birth year], MRN [id number]  PCP:  Tonye PearsonOLITTLE, ROBERT P, MD  Cardiologist:  Dr. Verne Carrowhristopher McAlhany     History of Present Illness: [name] [name] is a 63 y.o. female with a history of HTN, HL, depression, former tobacco abuse, strong family history of CAD and anxiety. She was evaluated by Dr. Clifton JamesMcAlhany in 2014 for fatigue, dyspnea and chest pressure. Stress Myoview demonstrated no ischemia. Echocardiogram demonstrated LV function and no significant valvular abnormalities.  She was admitted 01/2014 with bacterial meningitis (blood culture positive for Strep pneumonia). Patient was noted to have an elevated troponin and was seen by cardiology. Echo cardiogram demonstrated normal LV function and normal wall motion. Altered mental status prohibited proceeding with cardiac catheterization. It was felt that her elevated troponin was likely related to demand ischemia. Recommendation was to consider outpatient stress testing. She was discharged to inpatient rehabilitation.  She was noted to have dizziness related to left unilateral vestibular hypofunction. She also had decreased hearing on the left.  I saw her in FU 11/4.  She was still slowly recovering from her Meningitis.  She was not having any ischemic symptoms.  I brought her back today for FU and to consider pursuing stress testing.  She is doing well.  She remains weak but is getting stronger.  She is still frustrated with her memory issues.  She denies chest pain, dyspnea, syncope, orthopnea, PND.  She notes mild pedal edema.    Studies:  - Echo (9/15):  EF 55% to 60%. Wall motion was normal.  Grade 1 diastolic dysfunction  - Nuclear (8/14):  Normal stress nuclear study.  EF 74%.  - Carotid US (1/15):  No significant extracranial carotid artery stenosis   Recent Labs/Images: 04/17/2014: ALT 43*; BUN 15; Creatinine 0.74; Hemoglobin 12.1; LDL (calc) 84; Potassium 4.9;  Sodium 139; TSH 1.561   Estimated Creatinine Clearance: 89.2 mL/min (by C-G formula based on Cr of 0.74).    Wt Readings from Last 3 Encounters:  04/17/14 244 lb (110.678 kg)  03/15/14 237 lb (107.502 kg)  03/14/14 239 lb (108.41 kg)     Past Medical History  Diagnosis Date  . Cataract   . Depression   . Anxiety   . HTN (hypertension)   . Hyperlipemia   . Meningitis due to bacteria 01-18-14  . Gait disorder 03/14/2014  . Memory difficulty 03/14/2014    Current Outpatient Prescriptions  Medication Sig Dispense Refill  . aspirin 81 MG chewable tablet Chew 81 mg by mouth daily.    . carvedilol (COREG) 3.125 MG tablet Take 1 tablet (3.125 mg total) by mouth 2 (two) times daily with a meal. 180 tablet 3  . escitalopram (LEXAPRO) 20 MG tablet Take 0.5 tablets (10 mg total) by mouth daily. 90 tablet 3  . lisinopril (PRINIVIL,ZESTRIL) 20 MG tablet Take 1 tablet (20 mg total) by mouth daily. 90 tablet 3  . Multiple Vitamins-Minerals (MULTIVITAMIN PO) Take 1 tablet by mouth daily.    . simvastatin (ZOCOR) 20 MG tablet Take 1 tablet (20 mg total) by mouth daily. 90 tablet 3   No current facility-administered medications for this visit.     Allergies:   Review of patient's allergies indicates no known allergies.   Social History:  The patient  reports that she quit smoking about 2 years ago. Her smoking use included Cigarettes. She has a 20 pack-year smoking history. She has never used smokeless  tobacco. She reports that she does not drink alcohol or use illicit drugs.   Family History:  The patient's family history includes Cerebral aneurysm in her sister; Heart attack (age of onset: 38) in her father; Heart attack (age of onset: 36) in her mother; Hypertension in her brother, brother, mother, and sister; Rheum arthritis in her sister; Stroke in her mother.   ROS:  Please see the history of present illness.        All other systems reviewed and negative.    PHYSICAL EXAM: VS:  BP  150/80 mmHg  Pulse 71  Ht 5\' 5"  (1.651 m)  Wt 246 lb (111.585 kg)  BMI 40.94 kg/m2  SpO2 97% Well nourished, well developed, in no acute distress HEENT: normal Neck:  no JVD Cardiac:  normal S1, S2;  RRR; no murmur Lungs:   clear to auscultation bilaterally, no wheezing, rhonchi or rales Abd: soft, nontender, no hepatomegaly Ext:  no edema Skin: warm and dry Neuro:  CNs 2-12 intact, no focal abnormalities noted   ASSESSMENT AND PLAN:  1.  Elevated troponin:  She denies ischemic symptoms.  However, her Troponin was > 5.      -  Continue ASA, beta blocker, statin.    -  Arrange Lexiscan Myoview for risk stratification.  2.  Meningitis due to bacteria:   Slowly recovering. FU with neurology and PM&R as planned.  3.  Essential hypertension, benign:  BP elevated.  She has not had any medications yet today.  She admits to a diet rich in salt.  She is planning on adjusting her diet.      -  Low salt diet.    -  BP diary x 2 weeks and send to me.  4.  Hyperlipemia:  Continue statin.   04/17/2014: ALT 43*; HDL Cholesterol by NMR 79; LDL (calc) 84      Disposition:   FU with Dr. Verne Carrow in 6 mos or sooner if Myoview abnormal.     Signed, Brynda Rim, MHS 04/18/2014 7:57 AM    [facility] [address] [place], [place], [place]  [address] Phone: [phone]; Fax: [phone]

## 2014-04-18 NOTE — Patient Instructions (Signed)
Your physician has requested that you have a lexiscan myoview. For further information please visit [email or web address]. Please follow instruction sheet, as given.  Your physician wants you to follow-up in: 6 MONTHS WITH DR. Lodema HongMCALHANY  SHORTLY AFTER YOU START FOLLOWING YOUR LOW SODIUM DIET; YOU WILL NEED TO MONITOR BP A COUPLE OF TIMES A WEEK AND CALL WITH READINGS AFTER ABOUT 2 WEEKS [phone] SCOTT WEAVER, PAC  Low-Sodium Eating Plan Sodium raises blood pressure and causes water to be held in the body. Getting less sodium from food will help lower your blood pressure, reduce any swelling, and protect your heart, liver, and kidneys. We get sodium by adding salt (sodium chloride) to food. Most of our sodium comes from canned, boxed, and frozen foods. Restaurant foods, fast foods, and pizza are also very high in sodium. Even if you take medicine to lower your blood pressure or to reduce fluid in your body, getting less sodium from your food is important. WHAT IS MY PLAN? Most people should limit their sodium intake to 2,300 mg a day. Your health care provider recommends that you limit your sodium intake to __________ a day.  WHAT DO I NEED TO KNOW ABOUT THIS EATING PLAN? For the low-sodium eating plan, you will follow these general guidelines:  Choose foods with a % Daily Value for sodium of less than 5% (as listed on the food label).   Use salt-free seasonings or herbs instead of table salt or sea salt.   Check with your health care provider or pharmacist before using salt substitutes.   Eat fresh foods.  Eat more vegetables and fruits.  Limit canned vegetables. If you do use them, rinse them well to decrease the sodium.   Limit cheese to 1 oz (28 g) per day.   Eat lower-sodium products, often labeled as "lower sodium" or "no salt added."  Avoid foods that contain monosodium glutamate (MSG). MSG is sometimes added to [place] food and some canned foods.  Check food labels  (Nutrition Facts labels) on foods to learn how much sodium is in one serving.  Eat more home-cooked food and less restaurant, buffet, and fast food.  When eating at a restaurant, ask that your food be prepared with less salt or none, if possible.  HOW DO I READ FOOD LABELS FOR SODIUM INFORMATION? The Nutrition Facts label lists the amount of sodium in one serving of the food. If you eat more than one serving, you must multiply the listed amount of sodium by the number of servings. Food labels may also identify foods as:  Sodium free--Less than 5 mg in a serving.  Very low sodium--35 mg or less in a serving.  Low sodium--140 mg or less in a serving.  Light in sodium--50% less sodium in a serving. For example, if a food that usually has 300 mg of sodium is changed to become light in sodium, it will have 150 mg of sodium.  Reduced sodium--25% less sodium in a serving. For example, if a food that usually has 400 mg of sodium is changed to reduced sodium, it will have 300 mg of sodium. WHAT FOODS CAN I EAT? Grains Low-sodium cereals, including oats, puffed wheat and rice, and shredded wheat cereals. Low-sodium crackers. Unsalted rice and pasta. Lower-sodium bread.  Vegetables Frozen or fresh vegetables. Low-sodium or reduced-sodium canned vegetables. Low-sodium or reduced-sodium tomato sauce and paste. Low-sodium or reduced-sodium tomato and vegetable juices.  Fruits Fresh, frozen, and canned fruit. Fruit juice.  Meat and Other Protein Products  Low-sodium canned tuna and salmon. Fresh or frozen meat, poultry, seafood, and fish. Lamb. Unsalted nuts. Dried beans, peas, and lentils without added salt. Unsalted canned beans. Homemade soups without salt. Eggs.  Dairy Milk. Soy milk. Ricotta cheese. Low-sodium or reduced-sodium cheeses. Yogurt.  Condiments Fresh and dried herbs and spices. Salt-free seasonings. Onion and garlic powders. Low-sodium varieties of mustard and ketchup. Lemon  juice.  Fats and Oils Reduced-sodium salad dressings. Unsalted butter.  Other Unsalted popcorn and pretzels.  The items listed above may not be a complete list of recommended foods or beverages. Contact your dietitian for more options. WHAT FOODS ARE NOT RECOMMENDED? Grains Instant hot cereals. Bread stuffing, pancake, and biscuit mixes. Croutons. Seasoned rice or pasta mixes. Noodle soup cups. Boxed or frozen macaroni and cheese. Self-rising flour. Regular salted crackers. Vegetables Regular canned vegetables. Regular canned tomato sauce and paste. Regular tomato and vegetable juices. Frozen vegetables in sauces. Salted french fries. Olives. Rosita Fire. Relishes. Sauerkraut. Salsa. Meat and Other Protein Products Salted, canned, smoked, spiced, or pickled meats, seafood, or fish. Bacon, ham, sausage, hot dogs, corned beef, chipped beef, and packaged luncheon meats. Salt pork. Jerky. Pickled herring. Anchovies, regular canned tuna, and sardines. Salted nuts. Dairy Processed cheese and cheese spreads. Cheese curds. Blue cheese and cottage cheese. Buttermilk.  Condiments Onion and garlic salt, seasoned salt, table salt, and sea salt. Canned and packaged gravies. Worcestershire sauce. Tartar sauce. Barbecue sauce. Teriyaki sauce. Soy sauce, including reduced sodium. Steak sauce. Fish sauce. Oyster sauce. Cocktail sauce. Horseradish. Regular ketchup and mustard. Meat flavorings and tenderizers. Bouillon cubes. Hot sauce. Tabasco sauce. Marinades. Taco seasonings. Relishes. Fats and Oils Regular salad dressings. Salted butter. Margarine. Ghee. Bacon fat.  Other Potato and tortilla chips. Corn chips and puffs. Salted popcorn and pretzels. Canned or dried soups. Pizza. Frozen entrees and pot pies.  The items listed above may not be a complete list of foods and beverages to avoid. Contact your dietitian for more information. Document Released: 10/09/2001 Document Revised: 04/24/2013 Document  Reviewed: 02/21/2013 [facility] Patient Information 2015 [place], [place]. This information is not intended to replace advice given to you by your health care provider. Make sure you discuss any questions you have with your health care provider.  You will receive a reminder letter in the mail two months in advance. If you don't receive a letter, please call our office to schedule the follow-up appointment.

## 2014-04-19 ENCOUNTER — Ambulatory Visit (INDEPENDENT_AMBULATORY_CARE_PROVIDER_SITE_OTHER): Payer: Federal, State, Local not specified - PPO | Admitting: Podiatry

## 2014-04-19 ENCOUNTER — Encounter: Payer: Self-pay | Admitting: Podiatry

## 2014-04-19 VITALS — BP 137/68 | HR 77 | Ht 65.0 in | Wt 246.0 lb

## 2014-04-19 DIAGNOSIS — M21969 Unspecified acquired deformity of unspecified lower leg: Secondary | ICD-10-CM

## 2014-04-19 DIAGNOSIS — M722 Plantar fascial fibromatosis: Secondary | ICD-10-CM

## 2014-04-19 DIAGNOSIS — M216X9 Other acquired deformities of unspecified foot: Secondary | ICD-10-CM

## 2014-04-19 DIAGNOSIS — M79606 Pain in leg, unspecified: Secondary | ICD-10-CM

## 2014-04-19 NOTE — Progress Notes (Signed)
Subjective: 63 year old female presents complaining of pain in both heels R>L for duration of 3-4 months. Pain in heel in the morning and when walk without shoes.  She was in hospital due to bacterial meningitis in September.  Starting get busy due to holiday season. Usually on feet about 10-12 hours, but she is retired.   Review of Systems - General ROS: negative for - chills, fever or night sweats Ophthalmic ROS: negative ENT ROS: Lost hearing on left side since the illness. Allergy and Immunology ROS: negative Breast ROS: negative for breast lumps Respiratory ROS: no cough, shortness of breath, or wheezing Cardiovascular ROS: no chest pain or dyspnea on exertion Gastrointestinal ROS: no abdominal pain, change in bowel habits, or black or bloody stools Genito-Urinary ROS: no dysuria, trouble voiding, or hematuria Musculoskeletal ROS: negative Neurological ROS: Gets anxiety attack.  Dermatological ROS: negative.  Objective:  Dermatologic examination reveal normal skin without abnormal skin lesions. Vascular: All pedal pulses are palpable.  Neurologic: All epicritic and tactile sensations grossly intact.  Orthopedic: Positive for excess first ray sagittal plane motion upon loading of forefoot. Compensatory STJ hyperpronation noted.  Radiographic examination reveal presence of plantar and posterior heel spur bilateral, short first ray bilateral, elevated first ray bilateral.  No other acute changes noted.  Assessment: Plantar fasciitis R>L. Hypermobile first ray with compensatory hyperpronation subtalar joint bilateral R>L.   Plan: Reviewed findings and available treatment options. Patient did not want cortisone injection. Both feet casted for orthotics. Night Splint dispensed for right foot.

## 2014-04-19 NOTE — Patient Instructions (Signed)
Seen for bilateral heel pain. Night splint dispensed for right foot. Casted for orthotics.

## 2014-04-25 ENCOUNTER — Encounter: Payer: Self-pay | Admitting: Internal Medicine

## 2014-05-07 ENCOUNTER — Telehealth: Payer: Self-pay | Admitting: Physician Assistant

## 2014-05-07 ENCOUNTER — Ambulatory Visit (HOSPITAL_COMMUNITY): Payer: Federal, State, Local not specified - PPO | Attending: Physician Assistant | Admitting: Radiology

## 2014-05-07 DIAGNOSIS — Z0001 Encounter for general adult medical examination with abnormal findings: Secondary | ICD-10-CM | POA: Diagnosis not present

## 2014-05-07 DIAGNOSIS — R7989 Other specified abnormal findings of blood chemistry: Secondary | ICD-10-CM

## 2014-05-07 DIAGNOSIS — R0600 Dyspnea, unspecified: Secondary | ICD-10-CM

## 2014-05-07 DIAGNOSIS — R778 Other specified abnormalities of plasma proteins: Secondary | ICD-10-CM

## 2014-05-07 MED ORDER — REGADENOSON 0.4 MG/5ML IV SOLN
0.4000 mg | Freq: Once | INTRAVENOUS | Status: AC
  Administered 2014-05-07: 0.4 mg via INTRAVENOUS

## 2014-05-07 MED ORDER — TECHNETIUM TC 99M SESTAMIBI GENERIC - CARDIOLITE
30.0000 | Freq: Once | INTRAVENOUS | Status: AC | PRN
  Administered 2014-05-07: 30 via INTRAVENOUS

## 2014-05-07 NOTE — Progress Notes (Signed)
[facility] SITE 3 NUCLEAR MED [address] [place], [place] [address] [phone]    Cardiology Nuclear Med Study  [name] [name] is a 64 y.o. female     MRN : [id number]     DOB: [birth year]  Procedure Date: 05/07/2014  Nuclear Med Background Indication for Stress Test:  Evaluation for Ischemia, and 01-20-2014 elevated Troponin> 5 History:  12/13/12 MPI: EF: 74% NL Cardiac Risk Factors: Hypertension  Symptoms:  DOE and SOB   Nuclear Pre-Procedure Caffeine/Decaff Intake:  None> 12 hrs NPO After: 7:00pm   Lungs:  clear O2 Sat: 98% on room air. IV 0.9% NS with Angio Cath:  22g  IV Site: R Antecubital x 1, tolerated well IV Started by:  Irean Hong, RN  Chest Size (in):  40 Cup Size: B  Height: 5\' 5"  (1.651 m)  Weight:  248 lb (112.492 kg)  BMI:  Body mass index is 41.27 kg/(m^2). Tech Comments:  Patient took Lisinopril this am, but held Coreg. Irean Hong, RN.    Nuclear Med Study 1 or 2 day study: 2 day  Stress Test Type:  Lexiscan  Reading MD: N/A  Order Authorizing Provider:  Tereso Newcomer, PAC, and Verne Carrow, MD  Resting Radionuclide: Technetium 34m Sestamibi  Resting Radionuclide Dose: 11.0 mCi   Stress Radionuclide:  Technetium 55m Sestamibi  Stress Radionuclide Dose: 33.0 mCi           Stress Protocol Rest HR: 62 Stress HR: 78  Rest BP: 142/72 Stress BP: 165/93  Exercise Time (min): n/a METS: n/a   Predicted Max HR: 157 bpm % Max HR: 49.68 bpm Rate Pressure Product: 41324   Dose of Adenosine (mg):  n/a Dose of Lexiscan: 0.4 mg  Dose of Atropine (mg): n/a Dose of Dobutamine: n/a mcg/kg/min (at max HR)  Stress Test Technologist: Milana Na, EMT-P  Nuclear Technologist:  Kerby Nora, CNMT     Rest Procedure:  Myocardial perfusion imaging was performed at rest 45 minutes following the intravenous administration of Technetium 52m Sestamibi. Rest ECG: NSR - Normal EKG  Stress Procedure:  The patient received IV Lexiscan 0.4 mg  over 15-seconds.  Technetium 50m Sestamibi injected at 30-seconds. This patient had fatigue with the Lexiscan injection. Quantitative spect images were obtained after a 45 minute delay. Stress ECG: No significant change from baseline ECG  QPS Raw Data Images:  Mild breast attenuation; normal left ventricular size. Stress Images:  Subtle apical/distal anterior wall decreased uptake seen at both rest and stress consistent with breast attenuation artifact. Rest Images:  As above, otherwise homogeneous radiotracer uptake Subtraction (SDS):  5, however visually, no significant ischemia. Transient Ischemic Dilatation (Normal <1.22):  1.13 Lung/Heart Ratio (Normal <0.45):  0.29  Quantitative Gated Spect Images QGS EDV:  97 ml QGS ESV:  31 ml  Impression Exercise Capacity:  Lexiscan with low level exercise. BP Response:  Normal blood pressure response. Clinical Symptoms:  Typical symptoms with Lexiscan ECG Impression:  No significant ST segment change suggestive of ischemia. Comparison with Prior Nuclear Study: No images to compare  Overall Impression:  Low risk stress nuclear study with no areas of significant ischemia identified. Distal anterior/apical subtle defect likely secondary to breast attenuation artifact. Sensitivity of study reduced by noted attenuation..  LV Ejection Fraction: 68%.  LV Wall Motion:  NL LV Function; NL Wall Motion  Donato Schultz, MD

## 2014-05-07 NOTE — Telephone Encounter (Signed)
Walk in pt form " BP readings" gave to [facility]

## 2014-05-09 ENCOUNTER — Ambulatory Visit (HOSPITAL_COMMUNITY): Payer: Federal, State, Local not specified - PPO | Attending: Cardiology

## 2014-05-09 ENCOUNTER — Other Ambulatory Visit: Payer: Self-pay

## 2014-05-09 DIAGNOSIS — R0989 Other specified symptoms and signs involving the circulatory and respiratory systems: Secondary | ICD-10-CM

## 2014-05-09 DIAGNOSIS — Z1231 Encounter for screening mammogram for malignant neoplasm of breast: Secondary | ICD-10-CM

## 2014-05-09 MED ORDER — TECHNETIUM TC 99M SESTAMIBI GENERIC - CARDIOLITE
30.0000 | Freq: Once | INTRAVENOUS | Status: AC | PRN
  Administered 2014-05-09: 30 via INTRAVENOUS

## 2014-05-10 ENCOUNTER — Encounter: Payer: Self-pay | Admitting: Physician Assistant

## 2014-05-10 ENCOUNTER — Telehealth: Payer: Self-pay | Admitting: Physician Assistant

## 2014-05-10 DIAGNOSIS — I10 Essential (primary) hypertension: Secondary | ICD-10-CM

## 2014-05-10 MED ORDER — LISINOPRIL 40 MG PO TABS: 40.0000 mg | ORAL_TABLET | Freq: Every day | ORAL | Status: DC

## 2014-05-10 NOTE — Addendum Note (Signed)
Addended by: Tarri Fuller on: 05/10/2014 05:12 PM   Modules accepted: Orders

## 2014-05-10 NOTE — Telephone Encounter (Signed)
pt notified per Bing Neighbors. PA to increase lisinopril to 40 mg daily based on her BP readings he reviewed today. BMET 1/15. Pt verbalized understanding, New Rx sent in today.

## 2014-05-10 NOTE — Telephone Encounter (Signed)
BPs reviewed.  Increase Lisinopril to 40 mg QD.  BMET 1 week.  Tereso Newcomer, PA-C   05/10/2014 3:41 PM

## 2014-05-10 NOTE — Telephone Encounter (Signed)
pt notified per Scott W. PA to increase lisinopril to 40 mg daily based on her BP readings he reviewed today. BMET 1/15. Pt verbalized understanding, New Rx sent in today.  

## 2014-05-17 ENCOUNTER — Telehealth: Payer: Self-pay | Admitting: *Deleted

## 2014-05-17 ENCOUNTER — Other Ambulatory Visit (INDEPENDENT_AMBULATORY_CARE_PROVIDER_SITE_OTHER): Payer: Federal, State, Local not specified - PPO | Admitting: *Deleted

## 2014-05-17 DIAGNOSIS — I10 Essential (primary) hypertension: Secondary | ICD-10-CM

## 2014-05-17 LAB — BASIC METABOLIC PANEL
BUN: 23 mg/dL (ref 6–23)
CALCIUM: 9.4 mg/dL (ref 8.4–10.5)
CO2: 28 mEq/L (ref 19–32)
Chloride: 106 mEq/L (ref 96–112)
Creatinine, Ser: 0.78 mg/dL (ref 0.40–1.20)
GFR: 95.88 mL/min (ref >60.00)
GLUCOSE: 89 mg/dL (ref 70–99)
POTASSIUM: 4.3 meq/L (ref 3.5–5.1)
Sodium: 139 mEq/L (ref 135–145)

## 2014-05-17 NOTE — Telephone Encounter (Signed)
LM for pt. She was in this AM for lab and wanted to speak with nurse.  States [place] increased her Lisinopril 20 mg to 40 mg on 1/8. She states that since starting the increased dose she has been extremely tired and "foggy".  Spoke w/Scott who advises for her to try to take 20 mg BID and see if that would help.  LM directions on VM and to call back if she has any questions.

## 2014-05-20 ENCOUNTER — Telehealth: Payer: Self-pay | Admitting: *Deleted

## 2014-05-20 NOTE — Telephone Encounter (Signed)
pt notified of lab results with verbal understanding 

## 2014-06-06 ENCOUNTER — Telehealth: Payer: Self-pay

## 2014-06-06 NOTE — Telephone Encounter (Signed)
Patient wants to know if it is ok for her to take Tylenol regular strength with her current medications. She was recently diagnosed with bacterial meningitis. Patients call back number is [phone]

## 2014-06-06 NOTE — Telephone Encounter (Signed)
Left detailed message that it is ok to take Tylenol.

## 2014-06-06 NOTE — Telephone Encounter (Signed)
Is this ok, please advise.

## 2014-06-06 NOTE — Telephone Encounter (Signed)
Bacterial meningitis has been resolved for some time now. Liver function is unremarkable. Tylenol will not interact with her current prescriptions.  She may take Tylenol per OTC instructions.  [name] Boston, MS, PA-C   2:25 PM, 06/06/2014

## 2014-06-07 ENCOUNTER — Ambulatory Visit
Admission: RE | Admit: 2014-06-07 | Discharge: 2014-06-07 | Disposition: A | Discharge status: Home / Self Care | Payer: Federal, State, Local not specified - PPO | Source: Ambulatory Visit

## 2014-06-07 DIAGNOSIS — Z1231 Encounter for screening mammogram for malignant neoplasm of breast: Secondary | ICD-10-CM

## 2014-06-19 ENCOUNTER — Ambulatory Visit: Payer: Federal, State, Local not specified - PPO | Admitting: Podiatry

## 2014-06-27 ENCOUNTER — Encounter: Payer: Self-pay | Admitting: *Deleted

## 2014-06-27 ENCOUNTER — Telehealth: Payer: Self-pay | Admitting: *Deleted

## 2014-06-27 NOTE — Telephone Encounter (Signed)
called the patient three times to day to reschedule patient did not answer will send letter on 2 25 2016

## 2014-07-16 ENCOUNTER — Ambulatory Visit: Payer: Federal, State, Local not specified - PPO | Admitting: Adult Health

## 2014-07-26 ENCOUNTER — Ambulatory Visit (INDEPENDENT_AMBULATORY_CARE_PROVIDER_SITE_OTHER): Payer: Federal, State, Local not specified - PPO | Admitting: Adult Health

## 2014-07-26 ENCOUNTER — Encounter: Payer: Self-pay | Admitting: Adult Health

## 2014-07-26 VITALS — BP 155/87 | HR 69 | Ht 65.0 in | Wt 256.0 lb

## 2014-07-26 DIAGNOSIS — R269 Unspecified abnormalities of gait and mobility: Secondary | ICD-10-CM | POA: Diagnosis not present

## 2014-07-26 DIAGNOSIS — R413 Other amnesia: Secondary | ICD-10-CM

## 2014-07-26 DIAGNOSIS — G009 Bacterial meningitis, unspecified: Secondary | ICD-10-CM | POA: Diagnosis not present

## 2014-07-26 NOTE — Progress Notes (Signed)
PATIENT: [name] [name] DOB: 09/26/1950  REASON FOR VISIT: follow up- meningitis, gait disorder, mild memory disturbance HISTORY FROM: patient  HISTORY OF PRESENT ILLNESS: Ms. Gombos is a 64 year old female with a history of streptococcal pneumonia and meningitis. She returns today for follow-up. The patient has done well since her hospital stay. The patient denies any headaches. She states on 3 occasions she's had sharp shooting pains in the back of the head and in the left temporal area. This pain only lasted seconds and then resolved. She states that her gait and balance has improved. She still has some issues with her balance and has to be cautious. She states that if she is busy throughout the day her balance will get worse. She uses a cane when needed. The patient feels that her memory has returned back to baseline. She denies any additional issues. The patient is teary today. She states that she gets like this when she goes to the doctor due to the fear of something being wrong. She denies any new neurological symptoms. Denies new medical issues. She returns today for an evaluation.   HISTORY: Ms. Shoen is a 64 year old right-handed black female with a history of an admission to the hospital on 01/18/2014. The patient was found to have streptococcal pneumonia and subsequent meningitis. The patient was treated with IV antibiotics, and she has had a relatively good recovery. The patient did have some confusion for several days following admission. The patient has reported some decrease in hearing in the left ear following admission, and she also has some change in balance. She walks with a cane at this point, but she has not had any falls. The patient also reports some mild memory problems. Her sister indicates that she is having problems with executive function issues, unable to take decisions easily. She is living independently at this time, but she is not yet back to driving a car. The  patient denies any problems controlling the bowels or the bladder. She feels somewhat weak in the legs, and she may have intermittent numbness in the legs at times. The patient will have a cramping sensation in the left calf. The patient will be seen through an ENT physician in the near future to evaluate her hearing. She comes to this office for further evaluation  REVIEW OF SYSTEMS: Out of a complete 14 system review of symptoms, the patient complains only of the following symptoms, and all other reviewed systems are negative.  Hearing loss, runny nose, cough, walking difficulty, memory loss, headache  Geriatric depression scale 3  ALLERGIES: No Known Allergies  HOME MEDICATIONS: Outpatient Prescriptions Prior to Visit  Medication Sig Dispense Refill  . aspirin 81 MG chewable tablet Chew 81 mg by mouth daily.    . carvedilol (COREG) 3.125 MG tablet Take 1 tablet (3.125 mg total) by mouth 2 (two) times daily with a meal. 180 tablet 3  . escitalopram (LEXAPRO) 20 MG tablet Take 0.5 tablets (10 mg total) by mouth daily. 90 tablet 3  . Multiple Vitamins-Minerals (MULTIVITAMIN PO) Take 1 tablet by mouth daily.    . simvastatin (ZOCOR) 20 MG tablet Take 1 tablet (20 mg total) by mouth daily. 90 tablet 3  . lisinopril (PRINIVIL,ZESTRIL) 20 MG tablet Take 1 tablet (20 mg total) by mouth 2 (two) times daily. 90 tablet 3   No facility-administered medications prior to visit.    PAST MEDICAL HISTORY: Past Medical History  Diagnosis Date  . Cataract   . Depression   .  Anxiety   . HTN (hypertension)   . Hyperlipemia   . Meningitis due to bacteria 01-18-14  . Gait disorder 03/14/2014  . Memory difficulty 03/14/2014  . Hx of cardiovascular stress test     Lexiscan Myoview (1/16):  No ischemia, EF 68%, breast atten; Low Risk    PAST SURGICAL HISTORY: Past Surgical History  Procedure Laterality Date  . Cyst removal bilateral wrists      FAMILY HISTORY: Family History  Problem Relation  Age of Onset  . Heart attack Mother 53  . Stroke Mother   . Hypertension Mother   . Heart attack Father 7  . Cerebral aneurysm Sister   . Hypertension Sister   . Hypertension Brother   . Rheum arthritis Sister   . Hypertension Brother     SOCIAL HISTORY: History   Social History  . Marital Status: Divorced    Spouse Name: N/A  . Number of Children: 1  . Years of Education: college   Occupational History  . Retired [employer]    Social History Main Topics  . Smoking status: Former Smoker -- 0.50 packs/day for 40 years    Types: Cigarettes    Quit date: 07/09/2011  . Smokeless tobacco: Never Used  . Alcohol Use: No  . Drug Use: No  . Sexual Activity: No   Other Topics Concern  . Not on file   Social History Narrative   Patient is single lives at home alone, has 1 child   Patient is right handed   Education level is 1 year of college   Caffeine consumption is 3-4 cups daily      PHYSICAL EXAM  Filed Vitals:   07/26/14 0823  BP: 155/87  Pulse: 69  Height:  (1.651 m)  Weight: 256 lb (116.121 kg)   Body mass index is 42.6 kg/(m^2).  Generalized: Well developed, in no acute distress   Neurological examination  Mentation: Alert oriented to time, place, history taking. Follows all commands speech and language fluent Cranial nerve II-XII: Pupils were equal round reactive to light. Extraocular movements were full, visual field were full on confrontational test. Facial sensation and strength were normal. Uvula tongue midline. Head turning and shoulder shrug  were normal and symmetric. Motor: The motor testing reveals 5 over 5 strength of all 4 extremities. Good symmetric motor tone is noted throughout.  Sensory: Sensory testing is intact to soft touch on all 4 extremities. No evidence of extinction is noted.  Coordination: Cerebellar testing reveals good finger-nose-finger and heel-to-shin bilaterally.  Gait and station: Gait is normal. Tandem gait is normal.  Romberg is negative. No drift is seen.  Reflexes: Deep tendon reflexes are symmetric and normal bilaterally.    DIAGNOSTIC DATA (LABS, IMAGING, TESTING) - I reviewed patient records, labs, notes, testing and imaging myself where available.  Lab Results  Component Value Date   WBC 4.0 04/17/2014   HGB 12.1 04/17/2014   HCT 37.7 04/17/2014   MCV 79.4 04/17/2014   PLT 301 04/17/2014      Component Value Date/Time   NA 139 05/17/2014 1217   K 4.3 05/17/2014 1217   CL 106 05/17/2014 1217   CO2 28 05/17/2014 1217   GLUCOSE 89 05/17/2014 1217   BUN 23 05/17/2014 1217   CREATININE 0.78 05/17/2014 1217   CREATININE 0.74 04/17/2014 1156   CALCIUM 9.4 05/17/2014 1217   PROT 7.0 04/17/2014 1156   ALBUMIN 4.1 04/17/2014 1156   AST 36 04/17/2014 1156   ALT 43*  04/17/2014 1156   ALKPHOS 94 04/17/2014 1156   BILITOT 0.5 04/17/2014 1156   GFRNONAA >90 01/30/2014 0810   GFRNONAA 79 10/17/2013 1327   GFRAA >90 01/30/2014 0810   GFRAA >89 10/17/2013 1327   Lab Results  Component Value Date   CHOL 181 04/17/2014   HDL 79 04/17/2014   LDLCALC 84 04/17/2014   TRIG 88 04/17/2014   CHOLHDL 2.3 04/17/2014   Lab Results  Component Value Date   HGBA1C 6.1* 04/17/2014   No results found for: [id number] Lab Results  Component Value Date   TSH 1.561 04/17/2014      ASSESSMENT AND PLAN 64 y.o. year old female  has a past medical history of Cataract; Depression; Anxiety; HTN (hypertension); Hyperlipemia; Meningitis due to bacteria (01-18-14); Gait disorder (03/14/2014); Memory difficulty (03/14/2014); and cardiovascular stress test. here with:  1. Meningitis 2. Gait disorder 3. Mild memory disturbance  Overall the patient has done well after having meningitis. Her memory has returned to baseline. Her MMSE is 30/30 today. The patient's balance and gait has continued to improve. The patient has had 3 episodes of sharp shooting pain in the temporal area and in the back of the head. I  advised the patient that if this increases in frequency she should let us know. Otherwise we will just continue to monitor for now. If she develops any new symptoms she she'll let us know. Otherwise she will follow-up as needed.   Butch Penny, MSN, NP-C 07/26/2014, 8:52 AM [facility] [address] [place], [place] [address] [phone]  Note: This document was prepared with digital dictation and possible smart phrase technology. Any transcriptional errors that result from this process are unintentional.

## 2014-07-26 NOTE — Patient Instructions (Signed)
If the headaches worsen- sharp shooting pain please let us know.  Continue to be cautious with your balance and walking.  Continue with light exercise and begin dieting.

## 2014-07-26 NOTE — Progress Notes (Signed)
I have read the note, and I agree with the clinical assessment and plan.  [name] [name]   

## 2014-10-23 ENCOUNTER — Encounter: Payer: Self-pay | Admitting: Internal Medicine

## 2014-10-23 ENCOUNTER — Ambulatory Visit (INDEPENDENT_AMBULATORY_CARE_PROVIDER_SITE_OTHER): Payer: Federal, State, Local not specified - PPO | Admitting: Internal Medicine

## 2014-10-23 VITALS — BP 143/85 | HR 56 | Temp 98.4°F | Resp 16 | Ht 66.0 in | Wt 253.2 lb

## 2014-10-23 DIAGNOSIS — F411 Generalized anxiety disorder: Secondary | ICD-10-CM

## 2014-10-23 DIAGNOSIS — Z6832 Body mass index (BMI) 32.0-32.9, adult: Secondary | ICD-10-CM | POA: Diagnosis not present

## 2014-10-23 DIAGNOSIS — I10 Essential (primary) hypertension: Secondary | ICD-10-CM

## 2014-10-23 DIAGNOSIS — Z23 Encounter for immunization: Secondary | ICD-10-CM | POA: Diagnosis not present

## 2014-10-23 DIAGNOSIS — E785 Hyperlipidemia, unspecified: Secondary | ICD-10-CM | POA: Diagnosis not present

## 2014-10-23 LAB — CBC WITH DIFFERENTIAL/PLATELET
Basophils Absolute: 0 10*3/uL (ref 0.0–0.1)
Basophils Relative: 0 % (ref 0–1)
Eosinophils Absolute: 0.1 10*3/uL (ref 0.0–0.7)
Eosinophils Relative: 3 % (ref 0–5)
HEMATOCRIT: 39.3 % (ref 36.0–46.0)
HEMOGLOBIN: 12.7 g/dL (ref 12.0–15.0)
LYMPHS ABS: 1.6 10*3/uL (ref 0.7–4.0)
LYMPHS PCT: 47 % — AB (ref 12–46)
MCH: 25.8 pg — AB (ref 26.0–34.0)
MCHC: 32.3 g/dL (ref 30.0–36.0)
MCV: 79.7 fL (ref 78.0–100.0)
MPV: 9.8 fL (ref 8.6–12.4)
Monocytes Absolute: 0.2 10*3/uL (ref 0.1–1.0)
Monocytes Relative: 7 % (ref 3–12)
Neutro Abs: 1.5 10*3/uL — ABNORMAL LOW (ref 1.7–7.7)
Neutrophils Relative %: 43 % (ref 43–77)
Platelets: 281 10*3/uL (ref 150–400)
RBC: 4.93 MIL/uL (ref 3.87–5.11)
RDW: 13.9 % (ref 11.5–15.5)
WBC: 3.4 10*3/uL — AB (ref 4.0–10.5)

## 2014-10-23 LAB — COMPREHENSIVE METABOLIC PANEL
ALT: 11 U/L (ref 0–35)
AST: 15 U/L (ref 0–37)
Albumin: 4.2 g/dL (ref 3.5–5.2)
Alkaline Phosphatase: 75 U/L (ref 39–117)
BUN: 17 mg/dL (ref 6–23)
CO2: 29 meq/L (ref 19–32)
Calcium: 9.3 mg/dL (ref 8.4–10.5)
Chloride: 106 mEq/L (ref 96–112)
Creat: 0.87 mg/dL (ref 0.50–1.10)
Glucose, Bld: 89 mg/dL (ref 70–99)
Potassium: 4.1 mEq/L (ref 3.5–5.3)
SODIUM: 143 meq/L (ref 135–145)
TOTAL PROTEIN: 6.9 g/dL (ref 6.0–8.3)
Total Bilirubin: 0.5 mg/dL (ref 0.2–1.2)

## 2014-10-23 LAB — TSH: TSH: 1.175 u[IU]/mL (ref 0.350–4.500)

## 2014-10-23 LAB — LIPID PANEL
CHOL/HDL RATIO: 2.4 ratio
CHOLESTEROL: 167 mg/dL (ref 0–200)
HDL: 70 mg/dL (ref >=46)
LDL CALC: 82 mg/dL (ref 0–99)
TRIGLYCERIDES: 77 mg/dL (ref <150)
VLDL: 15 mg/dL (ref 0–40)

## 2014-10-23 NOTE — Progress Notes (Signed)
Subjective:    Patient ID: [name] [name], female    DOB: 06-02-1950, 64 y.o.   MRN: [id number]  HPIf/u Patient Active Problem List   Diagnosis Date Noted  . Hyperlipemia 10/20/2011    Priority: Medium  . HTN (hypertension) 10/20/2011    Priority: Medium  . BMI 32.0-32.9,adult 10/20/2011    Priority: Medium  . Generalized anxiety disorder 03/15/2014  . Memory difficulty 03/14/2014  . Debility 02/01/2014  . Demand myocardial ischemic related infarction 01/23/2014    Studies: - Echo (9/15): EF 55% to 60%. Wall motion was normal. Grade 1 diastolic dysfunction - Nuclear (8/14): Normal stress nuclear study. EF 74%. - Carotid US (1/15): No significant extracranial carotid artery stenosis she will eventually need a stress perfusion study for risk stratification.--- Has follow-up with cardiology   . Bacterial meningitis-01/18/2014-s.pneumo 01/18/2014  . Depression, reactive 10/12/2012  . Chronic neutropenia 04/14/2012  . Anemia--- stable  10/20/2011    Current outpatient prescriptions:  .  aspirin 81 MG chewable tablet, Chew 81 mg by mouth daily., Disp: , Rfl:  .  carvedilol (COREG) 3.125 MG tablet, Take 1 tablet (3.125 mg total) by mouth 2 (two) times daily with a meal., Disp: 180 tablet, Rfl: 3 .  escitalopram (LEXAPRO) 20 MG tablet, Take 0.5 tablets (10 mg total) by mouth daily., Disp: 90 tablet, Rfl: 3 .  lisinopril (PRINIVIL,ZESTRIL) 40 MG tablet, Take 40 mg by mouth daily., Disp: , Rfl:  .  Multiple Vitamins-Minerals (MULTIVITAMIN PO), Take 1 tablet by mouth daily., Disp: , Rfl:  .  simvastatin (ZOCOR) 20 MG tablet, Take 1 tablet (20 mg total) by mouth daily., Disp: 90 tablet, Rfl: 3  She is doing very well still in the recovery period from her meningitis and hospitalization. Her activity is better. Her memory is better. She continues to have some anxiety but feels she is much better on Lexapro which she has been on for many years. She is a Haematologist. She likes to read and  think without the complication provided by multiple other persons. She has good relationship with her sister.  Her MRIs show no objective signs of a long-term problem and no definite past infarct. She feels like her diet is very good and yet she is not losing weight. She is trying to exercise 3 times a week at the gym on the treadmill.  Health maintenance issues are up-to-date Review of Systems  Constitutional: Negative for activity change, appetite change, fatigue and unexpected weight change.  HENT: Negative for trouble swallowing.   Eyes: Negative for visual disturbance.  Respiratory: Negative for chest tightness and shortness of breath.   Cardiovascular: Negative for chest pain, palpitations and leg swelling.  Gastrointestinal: Negative for abdominal pain.  Genitourinary: Negative for difficulty urinating.  Musculoskeletal: Negative for back pain.  Neurological: Negative for headaches.  Psychiatric/Behavioral: Positive for dysphoric mood. Negative for confusion and sleep disturbance.       Objective:   Physical Exam BP 143/85 mmHg  Pulse 56  Temp(Src) 98.4 F (36.9 C) (Oral)  Resp 16  Ht 5\' 6"  (1.676 m)  Wt 253 lb 3.2 oz (114.851 kg)  BMI 40.89 kg/m2  SpO2 99% In general she appears quite well although overweight HEENT clear Heart regular without murmur Lungs clear Extremities with no peripheral edema/good distal pulses Cranial nerves II through XII intact Gait normal Cerebellar intact Mood good and affect appropriate   Wt Readings from Last 3 Encounters:  10/23/14 253 lb 3.2 oz (114.851 kg)  07/26/14 256  lb (116.121 kg)  05/07/14 248 lb (112.492 kg)       Assessment & Plan:  Hyperlipemia - Plan: Lipid panel  Essential hypertension - Plan: CBC with Differential/Platelet, Comprehensive metabolic panel --Blood pressure borderline in the office but her home blood pressures are much lower than this  BMI 32.0-32.9,adult - Plan: TSH  Generalized anxiety  disorder/occasional depressed mood --Continue Lexapro --She has improved at this point and sees no reason to change anything  Need for meningococcal vaccination --- This seems most reasonable given her episode of meningitis of unknown underlying etiology. She has had pneumococcal vaccine. She will be eligible for Prevnar at age 2.  Follow-up 3-6 months   Addendum 623 Labs normal

## 2014-10-24 ENCOUNTER — Encounter: Payer: Self-pay | Admitting: Internal Medicine

## 2014-11-18 ENCOUNTER — Telehealth: Payer: Self-pay

## 2014-11-18 NOTE — Telephone Encounter (Addendum)
Pt states she is overweight and would like to try Dr.Oz diet and wanted to know from her chart if that would be ok. Please call [phone]

## 2014-11-18 NOTE — Telephone Encounter (Signed)
The Dr. Neil Crouch diet would be fine

## 2014-11-18 NOTE — Telephone Encounter (Signed)
Spoke with pt, advised message from Dr. Doolittle. 

## 2014-11-18 NOTE — Telephone Encounter (Signed)
Please advise 

## 2015-02-05 ENCOUNTER — Telehealth: Payer: Self-pay

## 2015-02-05 NOTE — Telephone Encounter (Signed)
Ok-i called her Probiotics with

## 2015-02-05 NOTE — Telephone Encounter (Signed)
Spoke with pt, the peridontist rx's ABX Clindamycin  for 7 days. She wants to know if this safe for her to take with her health history. She is afraid to take this after reading the inserts. Please advise.

## 2015-02-05 NOTE — Telephone Encounter (Signed)
Pt was put on Clindanycin 150mg  for an infection in her mouth. She would like to know if it is OK for her to take this script. Please advise at [phone]

## 2015-03-26 ENCOUNTER — Encounter: Payer: Self-pay | Admitting: Internal Medicine

## 2015-03-31 ENCOUNTER — Encounter: Payer: Self-pay | Admitting: Internal Medicine

## 2015-04-07 ENCOUNTER — Encounter: Payer: Self-pay | Admitting: Internal Medicine

## 2015-04-23 ENCOUNTER — Encounter: Payer: Self-pay | Admitting: Internal Medicine

## 2015-04-23 ENCOUNTER — Ambulatory Visit (INDEPENDENT_AMBULATORY_CARE_PROVIDER_SITE_OTHER): Payer: Federal, State, Local not specified - PPO | Admitting: Internal Medicine

## 2015-04-23 VITALS — BP 134/74 | HR 64 | Temp 98.3°F | Resp 16 | Ht 66.0 in | Wt 258.0 lb

## 2015-04-23 DIAGNOSIS — D709 Neutropenia, unspecified: Secondary | ICD-10-CM | POA: Diagnosis not present

## 2015-04-23 DIAGNOSIS — F341 Dysthymic disorder: Secondary | ICD-10-CM

## 2015-04-23 DIAGNOSIS — R413 Other amnesia: Secondary | ICD-10-CM | POA: Diagnosis not present

## 2015-04-23 DIAGNOSIS — E785 Hyperlipidemia, unspecified: Secondary | ICD-10-CM

## 2015-04-23 DIAGNOSIS — Z6832 Body mass index (BMI) 32.0-32.9, adult: Secondary | ICD-10-CM

## 2015-04-23 DIAGNOSIS — I10 Essential (primary) hypertension: Secondary | ICD-10-CM

## 2015-04-23 DIAGNOSIS — R269 Unspecified abnormalities of gait and mobility: Secondary | ICD-10-CM

## 2015-04-23 DIAGNOSIS — Z0000 Encounter for general adult medical examination without abnormal findings: Secondary | ICD-10-CM | POA: Diagnosis not present

## 2015-04-23 DIAGNOSIS — Z1159 Encounter for screening for other viral diseases: Secondary | ICD-10-CM | POA: Diagnosis not present

## 2015-04-23 DIAGNOSIS — F411 Generalized anxiety disorder: Secondary | ICD-10-CM

## 2015-04-23 DIAGNOSIS — F329 Major depressive disorder, single episode, unspecified: Secondary | ICD-10-CM

## 2015-04-23 LAB — CBC WITH DIFFERENTIAL/PLATELET
BASOS PCT: 1 % (ref 0–1)
Basophils Absolute: 0 10*3/uL (ref 0.0–0.1)
EOS ABS: 0.1 10*3/uL (ref 0.0–0.7)
Eosinophils Relative: 3 % (ref 0–5)
HCT: 38.9 % (ref 36.0–46.0)
Hemoglobin: 12.1 g/dL (ref 12.0–15.0)
Lymphocytes Relative: 37 % (ref 12–46)
Lymphs Abs: 1.6 10*3/uL (ref 0.7–4.0)
MCH: 24.9 pg — AB (ref 26.0–34.0)
MCHC: 31.1 g/dL (ref 30.0–36.0)
MCV: 80 fL (ref 78.0–100.0)
MPV: 10.3 fL (ref 8.6–12.4)
Monocytes Absolute: 0.5 10*3/uL (ref 0.1–1.0)
Monocytes Relative: 11 % (ref 3–12)
Neutro Abs: 2.1 10*3/uL (ref 1.7–7.7)
Neutrophils Relative %: 48 % (ref 43–77)
Platelets: 322 10*3/uL (ref 150–400)
RBC: 4.86 MIL/uL (ref 3.87–5.11)
RDW: 13.7 % (ref 11.5–15.5)
WBC: 4.3 10*3/uL (ref 4.0–10.5)

## 2015-04-23 LAB — POCT GLYCOSYLATED HEMOGLOBIN (HGB A1C): Hemoglobin A1C: 6

## 2015-04-23 MED ORDER — SIMVASTATIN 20 MG PO TABS: 20.0000 mg | ORAL_TABLET | Freq: Every day | ORAL | Status: DC

## 2015-04-23 MED ORDER — CARVEDILOL 3.125 MG PO TABS: 3.1250 mg | ORAL_TABLET | Freq: Two times a day (BID) | ORAL | Status: DC

## 2015-04-23 MED ORDER — LISINOPRIL 40 MG PO TABS: 40.0000 mg | ORAL_TABLET | Freq: Every day | ORAL | Status: DC

## 2015-04-23 MED ORDER — ESCITALOPRAM OXALATE 10 MG PO TABS: 10.0000 mg | ORAL_TABLET | Freq: Every day | ORAL | Status: DC

## 2015-04-23 NOTE — Progress Notes (Signed)
Subjective:    Patient ID: [name] [name], female    DOB: 1951/03/31, 64 y.o.   MRN: [id number]  HPI annual Patient Active Problem List   Diagnosis Date Noted  . Hyperlipemia 10/20/2011  . HTN (hypertension) 10/20/2011  . BMI 32.0-32.9,adult w/ A1C 6.1 6 mos ago 10/20/2011  . Generalized anxiety disorder/Dysthymia now on lexapro and doing well--she is a sensitive loner and now that she's retired is able to chose public and family interactions---so she is doing well! 03/15/2014  . Bacterial meningitis-01/18/2014-s.pneumo with a little unsteadiness and memory difficulties since, tho stable or improving--released by neuro 01/18/2014  . Chronic neutropenia (HCC)--not an issue 04/14/2012    Current outpatient prescriptions:  .  aspirin 81 MG chewable tablet .  carvedilol (COREG) 3.125 MG tablet .  escitalopram (LEXAPRO) 10 MG .  lisinopril (PRINIVIL,ZESTRIL) 40 MG tablet .  Multiple Vitamins-Minerals (MULTIVITAMIN PO) .  simvastatin (ZOCOR) 20 MG t  She quit the gym--the "need 'to go was depressing her [name] her anxious as friends and family were always checking on her so she stopped andis searching for other ways to proote weight loss   2 episodes word loss but while anxious w/ cousin(life long issue w/ cousin) Sore l chest 2 weeks but after lifting w/out other cv sxt  Colo feb 17 mammo feb Good eye exam Review of Systems 14pt ROS per form otherwise negative    Objective:   Physical Exam  Constitutional: She is oriented to person, place, and time. She appears well-developed and well-nourished. No distress.  HENT:  Head: Normocephalic.  Right Ear: External ear normal.  Left Ear: External ear normal.  Nose: Nose normal.  Mouth/Throat: Oropharynx is clear and moist.  Eyes: Conjunctivae and EOM are normal. Pupils are equal, round, and reactive to light.  Neck: Normal range of motion. Neck supple. No thyromegaly present.  Cardiovascular: Normal rate, regular rhythm, normal  heart sounds and intact distal pulses.   No murmur heard. Pulmonary/Chest: Effort normal and breath sounds normal. She has no wheezes.  Abdominal: Soft. Bowel sounds are normal. She exhibits no distension and no mass. There is no tenderness. There is no rebound.  Musculoskeletal: Normal range of motion. She exhibits no edema or tenderness.  Lymphadenopathy:    She has no cervical adenopathy.  Neurological: She is alert and oriented to person, place, and time. She has normal reflexes. No cranial nerve deficit.  Skin: Skin is warm and dry. No rash noted.  Psychiatric: She has a normal mood and affect. Her behavior is normal. Judgment and thought content normal.  Nursing note and vitals reviewed.  BP 134/74 mmHg  Pulse 64  Temp(Src) 98.3 F (36.8 C)  Resp 16  Ht 5\' 6"  (1.676 m)  Wt 258 lb (117.028 kg)  BMI 41.66 kg/m2     Wt Readings from Last 3 Encounters:  04/23/15 258 lb (117.028 kg)  10/23/14 253 lb 3.2 oz (114.851 kg)  07/26/14 256 lb (116.121 kg)    Assessment & Plan:  Essential hypertension, benign -stable  Hyperlipemia--LDL controlled BMI 32.0-32.9,adult ---A1c 6.0!!!!  Memory difficulty/Gait disorder--post meningitis -better!  Generalized anxiety disorder Depression, reactive -both stable!!  Meds ordered this encounter  Medications  . carvedilol (COREG) 3.125 MG tablet    Sig: Take 1 tablet (3.125 mg total) by mouth 2 (two) times daily with a meal.    Dispense:  180 tablet    Refill:  3  . simvastatin (ZOCOR) 20 MG tablet    Sig: Take  1 tablet (20 mg total) by mouth daily.    Dispense:  90 tablet    Refill:  3  . lisinopril (PRINIVIL,ZESTRIL) 40 MG tablet    Sig: Take 1 tablet (40 mg total) by mouth daily.    Dispense:  90 tablet    Refill:  3  . escitalopram (LEXAPRO) 10 MG tablet    Sig: Take 1 tablet (10 mg total) by mouth daily.    Dispense:  90 tablet    Refill:  3  . FLUVIRIN 0.5 ML SUSY    Sig: ADM 0.5ML IM UTD    Refill:  0   F/u Dr Katrinka Blazing 6  months

## 2015-04-24 LAB — COMPREHENSIVE METABOLIC PANEL
ALBUMIN: 4.2 g/dL (ref 3.6–5.1)
ALT: 15 U/L (ref 6–29)
AST: 19 U/L (ref 10–35)
Alkaline Phosphatase: 90 U/L (ref 33–130)
BUN: 15 mg/dL (ref 7–25)
CHLORIDE: 102 mmol/L (ref 98–110)
CO2: 23 mmol/L (ref 20–31)
Calcium: 9.1 mg/dL (ref 8.6–10.4)
Creat: 0.76 mg/dL (ref 0.50–0.99)
Glucose, Bld: 93 mg/dL (ref 65–99)
POTASSIUM: 4.3 mmol/L (ref 3.5–5.3)
SODIUM: 138 mmol/L (ref 135–146)
Total Bilirubin: 0.5 mg/dL (ref 0.2–1.2)
Total Protein: 7.1 g/dL (ref 6.1–8.1)

## 2015-04-24 LAB — HEPATITIS C ANTIBODY: HCV Ab: NEGATIVE

## 2015-04-24 LAB — LIPID PANEL
CHOL/HDL RATIO: 2.1 ratio (ref <=5.0)
CHOLESTEROL: 164 mg/dL (ref 125–200)
HDL: 78 mg/dL (ref >=46)
LDL Cholesterol: 73 mg/dL (ref <130)
Triglycerides: 65 mg/dL (ref <150)
VLDL: 13 mg/dL (ref <30)

## 2015-04-24 LAB — TSH: TSH: 1.397 u[IU]/mL (ref 0.350–4.500)

## 2015-04-25 ENCOUNTER — Encounter: Payer: Self-pay | Admitting: Internal Medicine

## 2015-05-23 ENCOUNTER — Other Ambulatory Visit: Payer: Self-pay

## 2015-05-23 DIAGNOSIS — Z1231 Encounter for screening mammogram for malignant neoplasm of breast: Secondary | ICD-10-CM

## 2015-05-27 ENCOUNTER — Ambulatory Visit (AMBULATORY_SURGERY_CENTER): Payer: Self-pay | Admitting: *Deleted

## 2015-05-27 VITALS — Ht 65.5 in | Wt 264.4 lb

## 2015-05-27 DIAGNOSIS — Z86010 Personal history of colonic polyps: Secondary | ICD-10-CM

## 2015-05-27 MED ORDER — NA SULFATE-K SULFATE-MG SULF 17.5-3.13-1.6 GM/177ML PO SOLN: ORAL | Status: DC

## 2015-05-27 NOTE — Progress Notes (Signed)
No allergies to eggs or soy. No prior anesthesia.  Pt given Emmi instructions for colonoscopy  No oxygen use  No diet drug use  

## 2015-06-03 ENCOUNTER — Other Ambulatory Visit: Payer: Self-pay | Admitting: Internal Medicine

## 2015-06-09 ENCOUNTER — Telehealth: Payer: Self-pay | Admitting: Internal Medicine

## 2015-06-09 NOTE — Telephone Encounter (Signed)
Spoke with patient and told her she can still have the procedure.

## 2015-06-10 ENCOUNTER — Ambulatory Visit (AMBULATORY_SURGERY_CENTER): Payer: Federal, State, Local not specified - PPO | Admitting: Internal Medicine

## 2015-06-10 ENCOUNTER — Encounter: Payer: Self-pay | Admitting: Internal Medicine

## 2015-06-10 VITALS — BP 92/47 | HR 65 | Temp 97.0°F | Resp 23 | Ht 66.0 in | Wt 258.0 lb

## 2015-06-10 DIAGNOSIS — Z1211 Encounter for screening for malignant neoplasm of colon: Secondary | ICD-10-CM | POA: Diagnosis present

## 2015-06-10 DIAGNOSIS — Z86010 Personal history of colonic polyps: Secondary | ICD-10-CM

## 2015-06-10 MED ORDER — SODIUM CHLORIDE 0.9 % IV SOLN: 500.0000 mL | INTRAVENOUS | Status: DC

## 2015-06-10 NOTE — Op Note (Signed)
 [facility] [address] [place], [address]   COLONOSCOPY PROCEDURE REPORT  PATIENT: [name], [name]  MR#: [id number] BIRTHDATE: 04-26-1951 , 64  yrs. old GENDER: female ENDOSCOPIST: Roxy Cedar, MD REFERRED YI:[id number] Recall, PROCEDURE DATE:  06/10/2015 PROCEDURE:   Colonoscopy, screening First Screening Colonoscopy - Avg.  risk and is 50 yrs.  old or older - No.  Prior Negative Screening - Now for repeat screening. 10 or more years since last screening  History of Adenoma - Now for follow-up colonoscopy & has been > or = to 3 yrs.  N/A  Polyps removed today? No Recommend repeat exam, <10 yrs? No ASA CLASS:   Class II INDICATIONS:Screening for colonic neoplasia and Colorectal Neoplasm Risk Assessment for this procedure is average risk. Index examination December 2006 (tiny HP only). MEDICATIONS: Monitored anesthesia care and Propofol 300 mg IV  DESCRIPTION OF PROCEDURE:   After the risks benefits and alternatives of the procedure were thoroughly explained, informed consent was obtained.  The digital rectal exam revealed no abnormalities of the rectum.   The LB [id number]-[id number]  endoscope was introduced through the anus and advanced to the cecum, which was identified by both the appendix and ileocecal valve. No adverse events experienced.   The quality of the prep was excellent. (Suprep was used)  The instrument was then slowly withdrawn as the colon was fully examined. Estimated blood loss is zero unless otherwise noted in this procedure report.      COLON FINDINGS: A normal appearing cecum, ileocecal valve, and appendiceal orifice were identified.  The ascending, transverse, descending, sigmoid colon, and rectum appeared unremarkable. Retroflexed views revealed internal hemorrhoids. The time to cecum = 3.1 Withdrawal time = 11.0   The scope was withdrawn and the procedure completed. COMPLICATIONS: There were no immediate  complications.  ENDOSCOPIC IMPRESSION: Normal colonoscopy  RECOMMENDATIONS: Continue current colorectal screening recommendations for "routine risk" patients with a repeat colonoscopy in 10 years.  eSigned:  Roxy Cedar, MD 06/10/2015 11:01 AM   cc: The Patient and Nilda Simmer, MD

## 2015-06-10 NOTE — Progress Notes (Signed)
Patient awakening,vss,report to rn 

## 2015-06-10 NOTE — Patient Instructions (Signed)
YOU HAD AN ENDOSCOPIC PROCEDURE TODAY AT THE [facility]:   Refer to the procedure report that was given to you for any specific questions about what was found during the examination.  If the procedure report does not answer your questions, please call your gastroenterologist to clarify.  If you requested that your care partner not be given the details of your procedure findings, then the procedure report has been included in a sealed envelope for you to review at your convenience later.  YOU SHOULD EXPECT: Some feelings of bloating in the abdomen. Passage of more gas than usual.  Walking can help get rid of the air that was put into your GI tract during the procedure and reduce the bloating. If you had a lower endoscopy (such as a colonoscopy or flexible sigmoidoscopy) you may notice spotting of blood in your stool or on the toilet paper. If you underwent a bowel prep for your procedure, you may not have a normal bowel movement for a few days.  Please Note:  You might notice some irritation and congestion in your nose or some drainage.  This is from the oxygen used during your procedure.  There is no need for concern and it should clear up in a day or so.  SYMPTOMS TO REPORT IMMEDIATELY:   Following lower endoscopy (colonoscopy or flexible sigmoidoscopy):  Excessive amounts of blood in the stool  Significant tenderness or worsening of abdominal pains  Swelling of the abdomen that is new, acute  Fever of 100F or higher  For urgent or emergent issues, a gastroenterologist can be reached at any hour by calling [phone].   DIET: Your first meal following the procedure should be a small meal and then it is ok to progress to your normal diet. Heavy or fried foods are harder to digest and may make you feel nauseous or bloated.  Likewise, meals heavy in dairy and vegetables can increase bloating.  Drink plenty of fluids but you should avoid alcoholic beverages for 24  hours.  ACTIVITY:  You should plan to take it easy for the rest of today and you should NOT DRIVE or use heavy machinery until tomorrow (because of the sedation medicines used during the test).    FOLLOW UP: Our staff will call the number listed on your records the next business day following your procedure to check on you and address any questions or concerns that you may have regarding the information given to you following your procedure. If we do not reach you, we will leave a message.  However, if you are feeling well and you are not experiencing any problems, there is no need to return our call.  We will assume that you have returned to your regular daily activities without incident.  If any biopsies were taken you will be contacted by phone or by letter within the next 1-3 weeks.  Please call us at [phone] if you have not heard about the biopsies in 3 weeks.    SIGNATURES/CONFIDENTIALITY: You and/or your care partner have signed paperwork which will be entered into your electronic medical record.  These signatures attest to the fact that that the information above on your After Visit Summary has been reviewed and is understood.  Full responsibility of the confidentiality of this discharge information lies with you and/or your care-partner.  Normal screening Repeat Colon Cancer screening in 10 years

## 2015-06-11 ENCOUNTER — Telehealth: Payer: Self-pay | Admitting: Emergency Medicine

## 2015-06-11 NOTE — Telephone Encounter (Signed)
Left message, identifier present per f/u  

## 2015-06-12 ENCOUNTER — Ambulatory Visit
Admission: RE | Admit: 2015-06-12 | Discharge: 2015-06-12 | Disposition: A | Discharge status: Home / Self Care | Payer: Federal, State, Local not specified - PPO | Source: Ambulatory Visit

## 2015-06-12 DIAGNOSIS — Z1231 Encounter for screening mammogram for malignant neoplasm of breast: Secondary | ICD-10-CM

## 2015-06-13 ENCOUNTER — Other Ambulatory Visit: Payer: Self-pay | Admitting: Family Medicine

## 2015-06-13 DIAGNOSIS — R928 Other abnormal and inconclusive findings on diagnostic imaging of breast: Secondary | ICD-10-CM

## 2015-06-18 ENCOUNTER — Other Ambulatory Visit: Payer: Self-pay

## 2015-06-18 ENCOUNTER — Other Ambulatory Visit: Payer: Self-pay | Admitting: Family Medicine

## 2015-06-18 ENCOUNTER — Telehealth: Payer: Self-pay

## 2015-06-18 DIAGNOSIS — R928 Other abnormal and inconclusive findings on diagnostic imaging of breast: Secondary | ICD-10-CM

## 2015-06-18 NOTE — Telephone Encounter (Signed)
[facility] from [facility] requesting Dr. Katrinka Blazing to either fax a signed order to [phone] or co-sign the order in EPIC.  Patient has [place] scheduled for tomorrow at 9:15am.

## 2015-06-19 ENCOUNTER — Ambulatory Visit
Admission: RE | Admit: 2015-06-19 | Discharge: 2015-06-19 | Disposition: A | Discharge status: Home / Self Care | Payer: Federal, State, Local not specified - PPO | Source: Ambulatory Visit | Attending: Family Medicine | Admitting: Family Medicine

## 2015-06-19 ENCOUNTER — Other Ambulatory Visit: Payer: Self-pay | Admitting: Physician Assistant

## 2015-06-19 DIAGNOSIS — R928 Other abnormal and inconclusive findings on diagnostic imaging of breast: Secondary | ICD-10-CM

## 2015-06-23 NOTE — Telephone Encounter (Signed)
Order cosigned in [place].

## 2015-08-07 ENCOUNTER — Ambulatory Visit (INDEPENDENT_AMBULATORY_CARE_PROVIDER_SITE_OTHER): Payer: Federal, State, Local not specified - PPO | Admitting: Family Medicine

## 2015-08-07 VITALS — BP 136/80 | HR 65 | Temp 98.5°F | Resp 16 | Ht 66.0 in | Wt 241.0 lb

## 2015-08-07 DIAGNOSIS — J0100 Acute maxillary sinusitis, unspecified: Secondary | ICD-10-CM | POA: Diagnosis not present

## 2015-08-07 LAB — POCT CBC
Granulocyte percent: 53.7 %G (ref 37–80)
HCT, POC: 35.9 % — AB (ref 37.7–47.9)
Hemoglobin: 12.1 g/dL — AB (ref 12.2–16.2)
Lymph, poc: 1.4 (ref 0.6–3.4)
MCH, POC: 26.2 pg — AB (ref 27–31.2)
MCHC: 33.9 g/dL (ref 31.8–35.4)
MCV: 77.3 fL — AB (ref 80–97)
MID (cbc): 0.5 (ref 0–0.9)
MPV: 7.8 fL (ref 0–99.8)
POC Granulocyte: 2.1 (ref 2–6.9)
POC LYMPH PERCENT: 34.6 %L (ref 10–50)
POC MID %: 11.7 %M (ref 0–12)
Platelet Count, POC: 250 10*3/uL (ref 142–424)
RBC: 4.64 M/uL (ref 4.04–5.48)
RDW, POC: 14 %
WBC: 4 10*3/uL — AB (ref 4.6–10.2)

## 2015-08-07 MED ORDER — AMOXICILLIN-POT CLAVULANATE 875-125 MG PO TABS: 1.0000 | ORAL_TABLET | Freq: Two times a day (BID) | ORAL | Status: DC

## 2015-08-07 NOTE — Progress Notes (Signed)
This is a 65 year old retired [employer] who comes in with sinus congestion after having had a nonproductive cough for 4 days this week.  Patient has had allergies in the past and now notes some itchy and swollen eyes that seems to travel from one eye back to the other.  Patient's past medical history is significant in that she had meningitis 2 years ago with the onset being similar to this illness. Subsequent to that, patient's both pneumococcal vaccines and the meningitis vaccine. Patient had some hearing loss after the meningitis.  Objective: Patient's alert and pleasant. Neck is supple with no adenopathy although there is some fullness in her thyroid region. Chest: Clear Heart: Regular no murmur Head is normocephalic TMs: Marked fluid behind the right TM. Patient has lost some hearing in her left ear (from meningitis) Oropharynx is clear  Results for orders placed or performed in visit on 08/07/15  POCT CBC  Result Value Ref Range   WBC 4.0 (A) 4.6 - 10.2 K/uL   Lymph, poc 1.4 0.6 - 3.4   POC LYMPH PERCENT 34.6 10 - 50 %L   MID (cbc) 0.5 0 - 0.9   POC MID % 11.7 0 - 12 %M   POC Granulocyte 2.1 2 - 6.9   Granulocyte percent 53.7 37 - 80 %G   RBC 4.64 4.04 - 5.48 M/uL   Hemoglobin 12.1 (A) 12.2 - 16.2 g/dL   HCT, POC 48.2 (A) 70.7 - 47.9 %   MCV 77.3 (A) 80 - 97 fL   MCH, POC 26.2 (A) 27 - 31.2 pg   MCHC 33.9 31.8 - 35.4 g/dL   RDW, POC 86.7 %   Platelet Count, POC 250 142 - 424 K/uL   MPV 7.8 0 - 99.8 fL     Assessment: Patient has features of a allergy with acute sinusitis. I am reluctant to put her on any nasal steroid because of her past history of bacterial meningitis that began with sinus congestion.  Plan:  I think with her white count being down, the problem is allergies. I'm: Have the patient start taking Zyrtec and she's not better in 48 hours, I will call in some Augmentin.  Signed, Sheila Oats.D.

## 2015-08-07 NOTE — Patient Instructions (Signed)
Allergic Rhinitis Allergic rhinitis is when the mucous membranes in the nose respond to allergens. Allergens are particles in the air that cause your body to have an allergic reaction. This causes you to release allergic antibodies. Through a chain of events, these eventually cause you to release histamine into the blood stream. Although meant to protect the body, it is this release of histamine that causes your discomfort, such as frequent sneezing, congestion, and an itchy, runny nose.  CAUSES Seasonal allergic rhinitis (hay fever) is caused by pollen allergens that may come from grasses, trees, and weeds. Year-round allergic rhinitis (perennial allergic rhinitis) is caused by allergens such as house dust mites, pet dander, and mold spores. SYMPTOMS  Nasal stuffiness (congestion).  Itchy, runny nose with sneezing and tearing of the eyes. DIAGNOSIS Your health care provider can help you determine the allergen or allergens that trigger your symptoms. If you and your health care provider are unable to determine the allergen, skin or blood testing may be used. Your health care provider will diagnose your condition after taking your health history and performing a physical exam. Your health care provider may assess you for other related conditions, such as asthma, pink eye, or an ear infection. TREATMENT Allergic rhinitis does not have a cure, but it can be controlled by:  Medicines that block allergy symptoms. These may include allergy shots, nasal sprays, and oral antihistamines.  Avoiding the allergen. Hay fever may often be treated with antihistamines in pill or nasal spray forms. Antihistamines block the effects of histamine. There are over-the-counter medicines that may help with nasal congestion and swelling around the eyes. Check with your health care provider before taking or giving this medicine. If avoiding the allergen or the medicine prescribed do not work, there are many new medicines  your health care provider can prescribe. Stronger medicine may be used if initial measures are ineffective. Desensitizing injections can be used if medicine and avoidance does not work. Desensitization is when a patient is given ongoing shots until the body becomes less sensitive to the allergen. Make sure you follow up with your health care provider if problems continue. HOME CARE INSTRUCTIONS It is not possible to completely avoid allergens, but you can reduce your symptoms by taking steps to limit your exposure to them. It helps to know exactly what you are allergic to so that you can avoid your specific triggers. SEEK MEDICAL CARE IF:  You have a fever.  You develop a cough that does not stop easily (persistent).  You have shortness of breath.  You start wheezing.  Symptoms interfere with normal daily activities.   This information is not intended to replace advice given to you by your health care provider. Make sure you discuss any questions you have with your health care provider.   Document Released: 01/12/2001 Document Revised: 05/10/2014 Document Reviewed: 12/25/2012 Elsevier Interactive Patient Education 2016 Elsevier Inc. Sinusitis, Adult Sinusitis is redness, soreness, and inflammation of the paranasal sinuses. Paranasal sinuses are air pockets within the bones of your face. They are located beneath your eyes, in the middle of your forehead, and above your eyes. In healthy paranasal sinuses, mucus is able to drain out, and air is able to circulate through them by way of your nose. However, when your paranasal sinuses are inflamed, mucus and air can become trapped. This can allow bacteria and other germs to grow and cause infection. Sinusitis can develop quickly and last only a short time (acute) or continue over a long   period (chronic). Sinusitis that lasts for more than 12 weeks is considered chronic. CAUSES Causes of sinusitis include:  Allergies.  Structural abnormalities,  such as displacement of the cartilage that separates your nostrils (deviated septum), which can decrease the air flow through your nose and sinuses and affect sinus drainage.  Functional abnormalities, such as when the small hairs (cilia) that line your sinuses and help remove mucus do not work properly or are not present. SIGNS AND SYMPTOMS Symptoms of acute and chronic sinusitis are the same. The primary symptoms are pain and pressure around the affected sinuses. Other symptoms include:  Upper toothache.  Earache.  Headache.  Bad breath.  Decreased sense of smell and taste.  A cough, which worsens when you are lying flat.  Fatigue.  Fever.  Thick drainage from your nose, which often is green and may contain pus (purulent).  Swelling and warmth over the affected sinuses. DIAGNOSIS Your health care provider will perform a physical exam. During your exam, your health care provider may perform any of the following to help determine if you have acute sinusitis or chronic sinusitis:  Look in your nose for signs of abnormal growths in your nostrils (nasal polyps).  Tap over the affected sinus to check for signs of infection.  View the inside of your sinuses using an imaging device that has a light attached (endoscope). If your health care provider suspects that you have chronic sinusitis, one or more of the following tests may be recommended:  Allergy tests.  Nasal culture. A sample of mucus is taken from your nose, sent to a lab, and screened for bacteria.  Nasal cytology. A sample of mucus is taken from your nose and examined by your health care provider to determine if your sinusitis is related to an allergy. TREATMENT Most cases of acute sinusitis are related to a viral infection and will resolve on their own within 10 days. Sometimes, medicines are prescribed to help relieve symptoms of both acute and chronic sinusitis. These may include pain medicines, decongestants, nasal  steroid sprays, or saline sprays. However, for sinusitis related to a bacterial infection, your health care provider will prescribe antibiotic medicines. These are medicines that will help kill the bacteria causing the infection. Rarely, sinusitis is caused by a fungal infection. In these cases, your health care provider will prescribe antifungal medicine. For some cases of chronic sinusitis, surgery is needed. Generally, these are cases in which sinusitis recurs more than 3 times per year, despite other treatments. HOME CARE INSTRUCTIONS  Drink plenty of water. Water helps thin the mucus so your sinuses can drain more easily.  Use a humidifier.  Inhale steam 3-4 times a day (for example, sit in the bathroom with the shower running).  Apply a warm, moist washcloth to your face 3-4 times a day, or as directed by your health care provider.  Use saline nasal sprays to help moisten and clean your sinuses.  Take medicines only as directed by your health care provider.  If you were prescribed either an antibiotic or antifungal medicine, finish it all even if you start to feel better. SEEK IMMEDIATE MEDICAL CARE IF:  You have increasing pain or severe headaches.  You have nausea, vomiting, or drowsiness.  You have swelling around your face.  You have vision problems.  You have a stiff neck.  You have difficulty breathing.   This information is not intended to replace advice given to you by your health care provider. Make sure you discuss   any questions you have with your health care provider.   Document Released: 04/19/2005 Document Revised: 05/10/2014 Document Reviewed: 05/04/2011 Elsevier Interactive Patient Education 2016 Elsevier Inc.  

## 2015-10-20 ENCOUNTER — Ambulatory Visit: Payer: Federal, State, Local not specified - PPO | Admitting: Family Medicine

## 2015-10-28 ENCOUNTER — Ambulatory Visit (INDEPENDENT_AMBULATORY_CARE_PROVIDER_SITE_OTHER): Payer: Federal, State, Local not specified - PPO | Admitting: Family Medicine

## 2015-10-28 ENCOUNTER — Ambulatory Visit: Payer: Federal, State, Local not specified - PPO | Admitting: Family Medicine

## 2015-10-28 ENCOUNTER — Encounter: Payer: Self-pay | Admitting: Family Medicine

## 2015-10-28 VITALS — BP 162/92 | HR 88 | Temp 98.4°F | Resp 16 | Ht 65.5 in | Wt 261.2 lb

## 2015-10-28 DIAGNOSIS — Z6832 Body mass index (BMI) 32.0-32.9, adult: Secondary | ICD-10-CM | POA: Diagnosis not present

## 2015-10-28 DIAGNOSIS — E785 Hyperlipidemia, unspecified: Secondary | ICD-10-CM | POA: Diagnosis not present

## 2015-10-28 DIAGNOSIS — R7302 Impaired glucose tolerance (oral): Secondary | ICD-10-CM

## 2015-10-28 DIAGNOSIS — I10 Essential (primary) hypertension: Secondary | ICD-10-CM | POA: Diagnosis not present

## 2015-10-28 DIAGNOSIS — F411 Generalized anxiety disorder: Secondary | ICD-10-CM

## 2015-10-28 LAB — CBC WITH DIFFERENTIAL/PLATELET
BASOS ABS: 0 {cells}/uL (ref 0–200)
BASOS PCT: 0 %
EOS ABS: 136 {cells}/uL (ref 15–500)
Eosinophils Relative: 4 %
HEMATOCRIT: 40 % (ref 35.0–45.0)
Hemoglobin: 12.7 g/dL (ref 11.7–15.5)
LYMPHS PCT: 35 %
Lymphs Abs: 1190 cells/uL (ref 850–3900)
MCH: 25.1 pg — AB (ref 27.0–33.0)
MCHC: 31.8 g/dL — ABNORMAL LOW (ref 32.0–36.0)
MCV: 79.2 fL — AB (ref 80.0–100.0)
MONO ABS: 306 {cells}/uL (ref 200–950)
MPV: 10.3 fL (ref 7.5–12.5)
Monocytes Relative: 9 %
Neutro Abs: 1768 cells/uL (ref 1500–7800)
Neutrophils Relative %: 52 %
Platelets: 290 10*3/uL (ref 140–400)
RBC: 5.05 MIL/uL (ref 3.80–5.10)
RDW: 13.9 % (ref 11.0–15.0)
WBC: 3.4 10*3/uL — ABNORMAL LOW (ref 3.8–10.8)

## 2015-10-28 LAB — HEMOGLOBIN A1C
Hgb A1c MFr Bld: 6.1 % — ABNORMAL HIGH (ref <5.7)
MEAN PLASMA GLUCOSE: 128 mg/dL

## 2015-10-28 MED ORDER — LISINOPRIL 40 MG PO TABS: 40.0000 mg | ORAL_TABLET | Freq: Every day | ORAL | Status: DC

## 2015-10-28 NOTE — Patient Instructions (Addendum)
   IF you received an x-ray today, you will receive an invoice from [facility]. Please contact [facility] at [phone] with questions or concerns regarding your invoice.   IF you received labwork today, you will receive an invoice from [facility]. Please contact [facility] at [phone] with questions or concerns regarding your invoice.   Our billing staff will not be able to assist you with questions regarding bills from these companies.  You will be contacted with the lab results as soon as they are available. The fastest way to get your results is to activate your My Chart account. Instructions are located on the last page of this paperwork. If you have not heard from us regarding the results in 2 weeks, please contact this office.     Hypertension Hypertension, commonly called high blood pressure, is when the force of blood pumping through your arteries is too strong. Your arteries are the blood vessels that carry blood from your heart throughout your body. A blood pressure reading consists of a higher number over a lower number, such as 110/72. The higher number (systolic) is the pressure inside your arteries when your heart pumps. The lower number (diastolic) is the pressure inside your arteries when your heart relaxes. Ideally you want your blood pressure below 120/80. Hypertension forces your heart to work harder to pump blood. Your arteries may become narrow or stiff. Having untreated or uncontrolled hypertension can cause heart attack, stroke, kidney disease, and other problems. RISK FACTORS Some risk factors for high blood pressure are controllable. Others are not.  Risk factors you cannot control include:   Race. You may be at higher risk if you are African American.  Age. Risk increases with age.  Gender. Men are at higher risk than women before age 45 years. After age 65, women are at higher risk than men. Risk factors you can  control include:  Not getting enough exercise or physical activity.  Being overweight.  Getting too much fat, sugar, calories, or salt in your diet.  Drinking too much alcohol. SIGNS AND SYMPTOMS Hypertension does not usually cause signs or symptoms. Extremely high blood pressure (hypertensive crisis) may cause headache, anxiety, shortness of breath, and nosebleed. DIAGNOSIS To check if you have hypertension, your health care provider will measure your blood pressure while you are seated, with your arm held at the level of your heart. It should be measured at least twice using the same arm. Certain conditions can cause a difference in blood pressure between your right and left arms. A blood pressure reading that is higher than normal on one occasion does not mean that you need treatment. If it is not clear whether you have high blood pressure, you may be asked to return on a different day to have your blood pressure checked again. Or, you may be asked to monitor your blood pressure at home for 1 or more weeks. TREATMENT Treating high blood pressure includes making lifestyle changes and possibly taking medicine. Living a healthy lifestyle can help lower high blood pressure. You may need to change some of your habits. Lifestyle changes may include:  Following the DASH diet. This diet is high in fruits, vegetables, and whole grains. It is low in salt, red meat, and added sugars.  Keep your sodium intake below 2,300 mg per day.  Getting at least 30-45 minutes of aerobic exercise at least 4 times per week.  Losing weight if necessary.  Not smoking.  Limiting alcoholic beverages.    Learning ways to reduce stress. Your health care provider may prescribe medicine if lifestyle changes are not enough to get your blood pressure under control, and if one of the following is true:  You are 18-59 years of age and your systolic blood pressure is above 140.  You are 60 years of age or older, and  your systolic blood pressure is above 150.  Your diastolic blood pressure is above 90.  You have diabetes, and your systolic blood pressure is over 140 or your diastolic blood pressure is over 90.  You have kidney disease and your blood pressure is above 140/90.  You have heart disease and your blood pressure is above 140/90. Your personal target blood pressure may vary depending on your medical conditions, your age, and other factors. HOME CARE INSTRUCTIONS  Have your blood pressure rechecked as directed by your health care provider.   Take medicines only as directed by your health care provider. Follow the directions carefully. Blood pressure medicines must be taken as prescribed. The medicine does not work as well when you skip doses. Skipping doses also puts you at risk for problems.  Do not smoke.   Monitor your blood pressure at home as directed by your health care provider. SEEK MEDICAL CARE IF:   You think you are having a reaction to medicines taken.  You have recurrent headaches or feel dizzy.  You have swelling in your ankles.  You have trouble with your vision. SEEK IMMEDIATE MEDICAL CARE IF:  You develop a severe headache or confusion.  You have unusual weakness, numbness, or feel faint.  You have severe chest or abdominal pain.  You vomit repeatedly.  You have trouble breathing. MAKE SURE YOU:   Understand these instructions.  Will watch your condition.  Will get help right away if you are not doing well or get worse.   This information is not intended to replace advice given to you by your health care provider. Make sure you discuss any questions you have with your health care provider.   Document Released: 04/19/2005 Document Revised: 09/03/2014 Document Reviewed: 02/09/2013 Elsevier Interactive Patient Education 2016 Elsevier Inc.  

## 2015-10-28 NOTE — Progress Notes (Signed)
Subjective:    Patient ID: Foy Guadalajara, female    DOB: April 26, 1951, 65 y.o.   MRN: [id number]  10/28/2015  Establish Care; Follow-up; Medication Refill; and Depression   HPI This 65 y.o. female presents to establish care. Previous Scientist, physiological patient; physician for several years.  First started seeing Dr. Merla Riches at 89.  Followed evvery six months and then PRN.    HTN: Patient reports good compliance with medication, good tolerance to medication, and good symptom control.  Lisinopril 40mg  daily; dx 30 years ago.  Coreg daily; just started 2 years ago with meningitis.  No exercise.  Does not check BP at home; does not check.    Hypercholesterolemia: Patient reports good compliance with medication, good tolerance to medication, and good symptom control.    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  Started out as a cold typical; so went to bed.  Took OTC medication for cold. Progressed to pneumonia and progressed to meningitis.  Terrified of getting a cold.  Went through vaccinations for pneumonia and meningococcal.    AMI: s/p AMI.   Anxiety and depression: Patient reports good compliance with medication, good tolerance to medication, and good symptom control.  Started Lexapro in Sep 07, 2002 when mother died.  When son went away to the service; started Lexapro in 09-06-1989.  Lost sister October 04, 2015.     Hands hurt: B.  Swollen today.  Adult coloring; reads a lot as well.    Review of Systems  Constitutional: Negative for fever, chills, diaphoresis and fatigue.  Eyes: Negative for visual disturbance.  Respiratory: Negative for cough and shortness of breath.   Cardiovascular: Negative for chest pain, palpitations and leg swelling.  Gastrointestinal: Negative for nausea, vomiting, abdominal pain, diarrhea and constipation.  Endocrine: Negative for cold intolerance, heat  intolerance, polydipsia, polyphagia and polyuria.  Musculoskeletal: Positive for joint swelling and arthralgias.  Neurological: Negative for dizziness, tremors, seizures, syncope, facial asymmetry, speech difficulty, weakness, light-headedness, numbness and headaches.    Past Medical History  Diagnosis Date  . Cataract   . Depression   . Anxiety   . HTN (hypertension)   . Hyperlipemia   . Meningitis due to bacteria 01-18-14  . Gait disorder 03/14/2014  . Memory difficulty 03/14/2014  . Hx of cardiovascular stress test     Lexiscan Myoview (1/16):  No ischemia, EF 68%, breast atten; Low Risk   Past Surgical History  Procedure Laterality Date  . No past surgeries     No Known Allergies Current Outpatient Prescriptions  Medication Sig Dispense Refill  . aspirin 81 MG chewable tablet Chew 81 mg by mouth daily.    . carvedilol (COREG) 3.125 MG tablet Take 1 tablet (3.125 mg total) by mouth 2 (two) times daily with a meal. 180 tablet 3  . escitalopram (LEXAPRO) 10 MG tablet Take 1 tablet (10 mg total) by mouth daily. 90 tablet 3  . lisinopril (PRINIVIL,ZESTRIL) 40 MG tablet Take 1 tablet (40 mg total) by mouth daily. 90 tablet 3  . Multiple Vitamins-Minerals (MULTIVITAMIN PO) Take 1 tablet by mouth daily.    . simvastatin (ZOCOR) 20 MG tablet Take 1 tablet (20 mg total) by mouth daily. 90 tablet 3   No current facility-administered medications for this visit.   Social History   Social History  . Marital Status: Divorced    Spouse Name: N/A  .  Number of Children: 1  . Years of Education: college   Occupational History  . Retired [employer]    Social History Main Topics  . Smoking status: Former Smoker -- 0.50 packs/day for 40 years    Types: Cigarettes    Quit date: 07/09/2011  . Smokeless tobacco: Never Used  . Alcohol Use: No  . Drug Use: No  . Sexual Activity: No   Other Topics Concern  . Not on file   Social History Narrative   Marital status: divorced x 35 years; not  dating; not interested      Children: 1 child (19); 2 grandchildren      Lives:  Alone      Employment: retired 2008; Administrator, arts.      Tobacco: quit smoking in 2013.  Smoked x many years       Alcohol: none; holidays only      Exercise: none; quit exercising two years ago in 2015.         Patient is single lives at home alone, has 1 child   Patient is right handed   Education level is 1 year of college   Caffeine consumption is 3-4 cups daily   Family History  Problem Relation Age of Onset  . Heart attack Mother 45  . Stroke Mother   . Hypertension Mother   . Heart attack Father 12  . Cerebral aneurysm Sister   . Hypertension Sister   . Kidney disease Sister     ESRD; HD in last year  . Hypertension Brother   . Hyperlipidemia Brother   . Rheum arthritis Sister   . Arthritis Sister   . Hypertension Brother   . Hyperlipidemia Brother   . Hyperlipidemia Brother   . Hypertension Brother        Objective:    BP 136/94 mmHg  Pulse 88  Temp(Src) 98.4 F (36.9 C) (Oral)  Resp 16  Ht 5' 5.5" (1.664 m)  Wt 261 lb 3.2 oz (118.48 kg)  BMI 42.79 kg/m2  SpO2 97% Physical Exam  Constitutional: She is oriented to person, place, and time. She appears well-developed and well-nourished. No distress.  HENT:  Head: Normocephalic and atraumatic.  Right Ear: External ear normal.  Left Ear: External ear normal.  Nose: Nose normal.  Mouth/Throat: Oropharynx is clear and moist.  Eyes: Conjunctivae and EOM are normal. Pupils are equal, round, and reactive to light.  Neck: Normal range of motion. Neck supple. Carotid bruit is not present. No thyromegaly present.  Cardiovascular: Normal rate, regular rhythm, normal heart sounds and intact distal pulses.  Exam reveals no gallop and no friction rub.   No murmur heard. Pulmonary/Chest: Effort normal and breath sounds normal. She has no wheezes. She has no rales.  Abdominal: Soft. Bowel sounds are normal. She exhibits  no distension and no mass. There is no tenderness. There is no rebound and no guarding.  Lymphadenopathy:    She has no cervical adenopathy.  Neurological: She is alert and oriented to person, place, and time. No cranial nerve deficit.  Skin: Skin is warm and dry. No rash noted. She is not diaphoretic. No erythema. No pallor.  Psychiatric: She has a normal mood and affect. Her behavior is normal.   Results for orders placed or performed in visit on 08/07/15  POCT CBC  Result Value Ref Range   WBC 4.0 (A) 4.6 - 10.2 K/uL   Lymph, poc 1.4 0.6 - 3.4   POC LYMPH PERCENT  34.6 10 - 50 %L   MID (cbc) 0.5 0 - 0.9   POC MID % 11.7 0 - 12 %M   POC Granulocyte 2.1 2 - 6.9   Granulocyte percent 53.7 37 - 80 %G   RBC 4.64 4.04 - 5.48 M/uL   Hemoglobin 12.1 (A) 12.2 - 16.2 g/dL   HCT, POC 16.1 (A) 09.6 - 47.9 %   MCV 77.3 (A) 80 - 97 fL   MCH, POC 26.2 (A) 27 - 31.2 pg   MCHC 33.9 31.8 - 35.4 g/dL   RDW, POC 04.5 %   Platelet Count, POC 250 142 - 424 K/uL   MPV 7.8 0 - 99.8 fL       Assessment & Plan:   1. Essential hypertension   2. Hyperlipemia   3. Generalized anxiety disorder   4. BMI 32.0-32.9,adult   5. Glucose intolerance (impaired glucose tolerance)     Orders Placed This Encounter  Procedures  . CBC with Differential/Platelet  . Comprehensive metabolic panel    Order Specific Question:  Has the patient fasted?    Answer:  Yes  . Hemoglobin A1c  . Lipid panel    Order Specific Question:  Has the patient fasted?    Answer:  Yes   Meds ordered this encounter  Medications  . lisinopril (PRINIVIL,ZESTRIL) 40 MG tablet    Sig: Take 1 tablet (40 mg total) by mouth daily.    Dispense:  90 tablet    Refill:  3    Return in about 6 months (around 04/28/2016) for complete physical examiniation.    Rahmel Nedved Paulita Fujita, M.D. [facility] [address] [place], [place]  [address] [phone] phone [phone] fax

## 2015-10-29 LAB — LIPID PANEL
CHOL/HDL RATIO: 2.3 ratio (ref <=5.0)
Cholesterol: 179 mg/dL (ref 125–200)
HDL: 79 mg/dL (ref >=46)
LDL CALC: 79 mg/dL (ref <130)
Triglycerides: 106 mg/dL (ref <150)
VLDL: 21 mg/dL (ref <30)

## 2015-10-29 LAB — COMPREHENSIVE METABOLIC PANEL
ALBUMIN: 4.1 g/dL (ref 3.6–5.1)
ALT: 12 U/L (ref 6–29)
AST: 15 U/L (ref 10–35)
Alkaline Phosphatase: 80 U/L (ref 33–130)
BUN: 19 mg/dL (ref 7–25)
CHLORIDE: 104 mmol/L (ref 98–110)
CO2: 27 mmol/L (ref 20–31)
Calcium: 9.4 mg/dL (ref 8.6–10.4)
Creat: 0.89 mg/dL (ref 0.50–0.99)
GLUCOSE: 95 mg/dL (ref 65–99)
POTASSIUM: 4.7 mmol/L (ref 3.5–5.3)
Sodium: 140 mmol/L (ref 135–146)
Total Bilirubin: 0.5 mg/dL (ref 0.2–1.2)
Total Protein: 7.2 g/dL (ref 6.1–8.1)

## 2015-11-17 ENCOUNTER — Other Ambulatory Visit: Payer: Self-pay | Admitting: Physician Assistant

## 2015-11-17 ENCOUNTER — Other Ambulatory Visit: Payer: Self-pay | Admitting: Family Medicine

## 2015-11-17 DIAGNOSIS — N6320 Unspecified lump in the left breast, unspecified quadrant: Secondary | ICD-10-CM

## 2015-12-09 ENCOUNTER — Other Ambulatory Visit: Payer: Federal, State, Local not specified - PPO

## 2015-12-11 ENCOUNTER — Telehealth: Payer: Self-pay

## 2015-12-11 NOTE — Telephone Encounter (Signed)
Patient would like to come by tomorrow and pick up her lab results. Please call when ready! [phone]

## 2015-12-11 NOTE — Telephone Encounter (Signed)
Ready to pick up.  

## 2015-12-11 NOTE — Telephone Encounter (Signed)
Pt notified on voicemail  °

## 2015-12-16 ENCOUNTER — Ambulatory Visit
Admission: RE | Admit: 2015-12-16 | Discharge: 2015-12-16 | Disposition: A | Discharge status: Home / Self Care | Payer: Federal, State, Local not specified - PPO | Source: Ambulatory Visit | Attending: Family Medicine | Admitting: Family Medicine

## 2015-12-16 DIAGNOSIS — N6320 Unspecified lump in the left breast, unspecified quadrant: Secondary | ICD-10-CM

## 2016-05-11 ENCOUNTER — Other Ambulatory Visit: Payer: Self-pay | Admitting: Emergency Medicine

## 2016-05-11 MED ORDER — SIMVASTATIN 20 MG PO TABS: 20.0000 mg | ORAL_TABLET | Freq: Every day | ORAL | 3 refills | Status: DC

## 2016-05-13 ENCOUNTER — Other Ambulatory Visit: Payer: Self-pay | Admitting: Family Medicine

## 2016-05-13 DIAGNOSIS — N63 Unspecified lump in unspecified breast: Secondary | ICD-10-CM

## 2016-05-18 ENCOUNTER — Ambulatory Visit (INDEPENDENT_AMBULATORY_CARE_PROVIDER_SITE_OTHER): Payer: Medicare Other | Admitting: Family Medicine

## 2016-05-18 ENCOUNTER — Encounter: Payer: Self-pay | Admitting: Family Medicine

## 2016-05-18 VITALS — BP 144/76 | HR 63 | Temp 98.8°F | Resp 16 | Ht 65.75 in | Wt 270.6 lb

## 2016-05-18 DIAGNOSIS — E7800 Pure hypercholesterolemia, unspecified: Secondary | ICD-10-CM | POA: Diagnosis not present

## 2016-05-18 DIAGNOSIS — Z0000 Encounter for general adult medical examination without abnormal findings: Secondary | ICD-10-CM

## 2016-05-18 DIAGNOSIS — Z01419 Encounter for gynecological examination (general) (routine) without abnormal findings: Secondary | ICD-10-CM

## 2016-05-18 DIAGNOSIS — I10 Essential (primary) hypertension: Secondary | ICD-10-CM | POA: Diagnosis not present

## 2016-05-18 DIAGNOSIS — R7302 Impaired glucose tolerance (oral): Secondary | ICD-10-CM | POA: Diagnosis not present

## 2016-05-18 DIAGNOSIS — R413 Other amnesia: Secondary | ICD-10-CM

## 2016-05-18 DIAGNOSIS — I219 Acute myocardial infarction, unspecified: Secondary | ICD-10-CM | POA: Diagnosis not present

## 2016-05-18 DIAGNOSIS — F411 Generalized anxiety disorder: Secondary | ICD-10-CM | POA: Diagnosis not present

## 2016-05-18 DIAGNOSIS — G009 Bacterial meningitis, unspecified: Secondary | ICD-10-CM | POA: Diagnosis not present

## 2016-05-18 DIAGNOSIS — Z124 Encounter for screening for malignant neoplasm of cervix: Secondary | ICD-10-CM | POA: Diagnosis not present

## 2016-05-18 DIAGNOSIS — D709 Neutropenia, unspecified: Secondary | ICD-10-CM

## 2016-05-18 LAB — POCT URINALYSIS DIP (MANUAL ENTRY)
BILIRUBIN UA: NEGATIVE
Blood, UA: NEGATIVE
GLUCOSE UA: NEGATIVE
Ketones, POC UA: NEGATIVE
LEUKOCYTES UA: NEGATIVE
NITRITE UA: NEGATIVE
Protein Ur, POC: NEGATIVE
Spec Grav, UA: 1.02
UROBILINOGEN UA: 0.2
pH, UA: 7

## 2016-05-18 MED ORDER — HYDROCHLOROTHIAZIDE 12.5 MG PO TABS: 12.5000 mg | ORAL_TABLET | Freq: Every day | ORAL | 3 refills | Status: DC

## 2016-05-18 MED ORDER — SIMVASTATIN 20 MG PO TABS: 20.0000 mg | ORAL_TABLET | Freq: Every day | ORAL | 3 refills | Status: DC

## 2016-05-18 MED ORDER — LISINOPRIL 40 MG PO TABS: 40.0000 mg | ORAL_TABLET | Freq: Every day | ORAL | 3 refills | Status: DC

## 2016-05-18 MED ORDER — CARVEDILOL 3.125 MG PO TABS: 3.1250 mg | ORAL_TABLET | Freq: Two times a day (BID) | ORAL | 3 refills | Status: DC

## 2016-05-18 MED ORDER — ESCITALOPRAM OXALATE 10 MG PO TABS: 10.0000 mg | ORAL_TABLET | Freq: Every day | ORAL | 3 refills | Status: DC

## 2016-05-18 NOTE — Progress Notes (Signed)
Subjective:    Patient ID: [name] [name], female    DOB: 1950/06/08, 66 y.o.   MRN: [id number]  05/18/2016  Annual Exam   HPI This 66 y.o. female presents for Annual Wellness Examination/Welcome to Medicare Exam and Complete Physical Examination.  Last physical: Pap smear:  04-18-2013 WNL Mammogram: scheduled in 06-2016; followed every six months. Colonoscopy: 06-2015; WNL; repeat ten years. Bone density: never Eye exam: 2017 Dental exam:  Every 3 months; peridontist.   Immunization History  Administered Date(s) Administered  . Influenza-Unspecified 02/20/2013, 01/18/2014, 01/09/2016  . Meningococcal Conjugate 10/23/2014  . Pneumococcal Conjugate-13 04/17/2014  . Pneumococcal Polysaccharide-23 04/18/2013  . Tdap 03/08/2007  . Zoster 05/03/2010   BP Readings from Last 3 Encounters:  05/18/16 (!) 147/76  10/28/15 (!) 162/92  08/07/15 136/80   Wt Readings from Last 3 Encounters:  05/18/16 270 lb 9.6 oz (122.7 kg)  10/28/15 261 lb 3.2 oz (118.5 kg)  08/07/15 241 lb (109.3 kg)   HTN: Patient reports good compliance with medication, good tolerance to medication, and good symptom control.  Not checking BP at home.  Hypercholesterolemia: Patient reports good compliance with medication, good tolerance to medication, and good symptom control.    Anxiety and depression: Patient reports good compliance with medication, good tolerance to medication, and good symptom control.     Review of Systems  Constitutional: Negative for activity change, appetite change, chills, diaphoresis, fatigue, fever and unexpected weight change.  HENT: Negative for congestion, dental problem, drooling, ear discharge, ear pain, facial swelling, hearing loss, mouth sores, nosebleeds, postnasal drip, rhinorrhea, sinus pressure, sneezing, sore throat, tinnitus, trouble swallowing and voice change.   Eyes: Negative for photophobia, pain, discharge, redness, itching and visual disturbance.  Respiratory:  Negative for apnea, cough, choking, chest tightness, shortness of breath, wheezing and stridor.   Cardiovascular: Negative for chest pain, palpitations and leg swelling.  Gastrointestinal: Negative for abdominal distention, abdominal pain, anal bleeding, blood in stool, constipation, diarrhea, nausea, rectal pain and vomiting.  Endocrine: Negative for cold intolerance, heat intolerance, polydipsia, polyphagia and polyuria.  Genitourinary: Negative for decreased urine volume, difficulty urinating, dyspareunia, dysuria, enuresis, flank pain, frequency, genital sores, hematuria, menstrual problem, pelvic pain, urgency, vaginal bleeding, vaginal discharge and vaginal pain.       Nocturia x 3.  Small urinary leakage.  Musculoskeletal: Negative for arthralgias, back pain, gait problem, joint swelling, myalgias, neck pain and neck stiffness.  Skin: Negative for color change, pallor, rash and wound.  Allergic/Immunologic: Negative for environmental allergies, food allergies and immunocompromised state.  Neurological: Negative for dizziness, tremors, seizures, syncope, facial asymmetry, speech difficulty, weakness, light-headedness, numbness and headaches.  Hematological: Negative for adenopathy. Does not bruise/bleed easily.  Psychiatric/Behavioral: Positive for sleep disturbance. Negative for agitation, behavioral problems, confusion, decreased concentration, dysphoric mood, hallucinations, self-injury and suicidal ideas. The patient is not nervous/anxious and is not hyperactive.        Bedtime 1:00am; wakes up at 9:30am.     Past Medical History:  Diagnosis Date  . Anxiety   . Cataract   . Depression   . Gait disorder 03/14/2014  . HTN (hypertension)   . Hx of cardiovascular stress test    Lexiscan Myoview (1/16):  No ischemia, EF 68%, breast atten; Low Risk  . Hyperlipemia   . Memory difficulty 03/14/2014  . Meningitis due to bacteria 01-18-14   Past Surgical History:  Procedure Laterality  Date  . NO PAST SURGERIES     No Known Allergies  Social History  Social History  . Marital status: Divorced    Spouse name: N/A  . Number of children: 1  . Years of education: college   Occupational History  . Retired [employer]   Social History Main Topics  . Smoking status: Former Smoker    Packs/day: 0.50    Years: 40.00    Types: Cigarettes    Quit date: 07/09/2011  . Smokeless tobacco: Never Used  . Alcohol use No  . Drug use: No  . Sexual activity: No   Other Topics Concern  . Not on file   Social History Narrative   Marital status: divorced x 35 years; not dating; not interested in 2018      Children: 1 child/son (44); 2 grandchildren; [place]      Lives:  Alone in [place] in [place].      Employment: retired 2008; Administrator, arts.      Tobacco: quit smoking in 2013.  Smoked x many years       Alcohol: none; holidays only      Exercise: none; quit exercising two years ago in 2015.  Plans to join [employer]      Seatbelt: 100%; no texting. Drives locally       ADLs: drives locally; independent with ADLs.  Has cane for long distances      Advanced Directives:          Patient is single lives at home alone, has 1 child   Patient is right handed   Education level is 1 year of college   Caffeine consumption is 3-4 cups daily   Family History  Problem Relation Age of Onset  . Heart attack Mother 49  . Stroke Mother   . Hypertension Mother   . Heart attack Father 15  . Cerebral aneurysm Sister   . Hypertension Sister   . Kidney disease Sister     ESRD; HD in last year  . Hypertension Brother   . Hyperlipidemia Brother   . Rheum arthritis Sister   . Arthritis Sister   . Hypertension Brother   . Hyperlipidemia Brother   . Hyperlipidemia Brother   . Hypertension Brother        Objective:    BP (!) 147/76 (BP Location: Right Arm, Patient Position: Sitting, Cuff Size: Large)   Pulse 63   Temp 98.8 F (37.1 C) (Oral)   Resp  16   Ht 5' 5.75" (1.67 m)   Wt 270 lb 9.6 oz (122.7 kg)   SpO2 96%   BMI 44.01 kg/m  Physical Exam  Constitutional: She is oriented to person, place, and time. She appears well-developed and well-nourished. No distress.  HENT:  Head: Normocephalic and atraumatic.  Right Ear: External ear normal.  Left Ear: External ear normal.  Nose: Nose normal.  Mouth/Throat: Oropharynx is clear and moist.  Eyes: Conjunctivae and EOM are normal. Pupils are equal, round, and reactive to light.  Neck: Normal range of motion and full passive range of motion without pain. Neck supple. No JVD present. Carotid bruit is not present. No thyromegaly present.  Cardiovascular: Normal rate, regular rhythm and normal heart sounds.  Exam reveals no gallop and no friction rub.   No murmur heard. Pulmonary/Chest: Effort normal and breath sounds normal. She has no wheezes. She has no rales. Right breast exhibits no inverted nipple, no mass, no nipple discharge, no skin change and no tenderness. Left breast exhibits no inverted nipple, no mass, no nipple discharge, no skin  change and no tenderness. Breasts are symmetrical.  Abdominal: Soft. Bowel sounds are normal. She exhibits no distension and no mass. There is no tenderness. There is no rebound and no guarding.  Musculoskeletal:       Right shoulder: Normal.       Left shoulder: Normal.       Cervical back: Normal.  Lymphadenopathy:    She has no cervical adenopathy.  Neurological: She is alert and oriented to person, place, and time. She has normal reflexes. No cranial nerve deficit. She exhibits normal muscle tone. Coordination normal.  Skin: Skin is warm and dry. No rash noted. She is not diaphoretic. No erythema. No pallor.  Psychiatric: She has a normal mood and affect. Her behavior is normal. Judgment and thought content normal.  Nursing note and vitals reviewed.   Depression screen [facility] 2/9 05/18/2016 10/28/2015 08/07/2015 04/23/2015 10/23/2014  Decreased  Interest 0 3 0 0 0  Down, Depressed, Hopeless 0 1 0 1 0  PHQ - 2 Score 0 4 0 1 0  Altered sleeping - 2 - - -  Tired, decreased energy - 3 - - -  Change in appetite - 2 - - -  Feeling bad or failure about yourself  - 1 - - -  Trouble concentrating - 1 - - -  Moving slowly or fidgety/restless - 0 - - -  Suicidal thoughts - 0 - - -  PHQ-9 Score - 13 - - -   Fall Risk  05/18/2016 10/28/2015 04/23/2015 10/23/2014 04/17/2014  Falls in the past year? Yes Yes No No No  Number falls in past yr: 1 1 - - -  Injury with Fall? No No - - -   Functional Status Survey: Is the patient deaf or have difficulty hearing?: Yes (SOMETIMES LEFT EAR) Does the patient have difficulty seeing, even when wearing glasses/contacts?: No Does the patient have difficulty concentrating, remembering, or making decisions?: Yes Does the patient have difficulty walking or climbing stairs?: Yes (PER PATIENT A LITTLE BECAUSE OF WEIGHT) Does the patient have difficulty dressing or bathing?: No Does the patient have difficulty doing errands alone such as visiting a doctor's office or shopping?: No     Assessment & Plan:   1. Welcome to Medicare preventive visit   2. Routine physical examination   3. Essential hypertension   4. Demand myocardial ischemic related infarction   5. Bacterial meningitis-01/18/2014-s.pneumo   6. Pure hypercholesterolemia   7. Memory difficulty   8. Generalized anxiety disorder   9. Chronic neutropenia (HCC)   10. BMI 32.0-32.9,adult   11. Glucose intolerance (impaired glucose tolerance)   12. Essential hypertension, benign   13. Pap smear for cervical cancer screening   14. Encounter for gynecological examination without abnormal finding    -anticipatory guidance --- exercise, weight loss, 3 servings of dairy daily, ASA 81mg  daily. -obtain labs for chronic disease management; refills provided. -pap smear obtained.   Orders Placed This Encounter  Procedures  . CBC with  Differential/Platelet  . Comprehensive metabolic panel    Order Specific Question:   Has the patient fasted?    Answer:   Yes  . Lipid panel    Order Specific Question:   Has the patient fasted?    Answer:   Yes  . TSH  . Hemoglobin A1c  . Care order/instruction:    Please recheck BP.  Marland Kitchen POCT urinalysis dipstick  . EKG 12-Lead   Meds ordered this encounter  Medications  . escitalopram (  LEXAPRO) 10 MG tablet    Sig: Take 1 tablet (10 mg total) by mouth daily.    Dispense:  90 tablet    Refill:  3  . carvedilol (COREG) 3.125 MG tablet    Sig: Take 1 tablet (3.125 mg total) by mouth 2 (two) times daily with a meal.    Dispense:  180 tablet    Refill:  3  . lisinopril (PRINIVIL,ZESTRIL) 40 MG tablet    Sig: Take 1 tablet (40 mg total) by mouth daily.    Dispense:  90 tablet    Refill:  3  . simvastatin (ZOCOR) 20 MG tablet    Sig: Take 1 tablet (20 mg total) by mouth daily.    Dispense:  90 tablet    Refill:  3  . hydrochlorothiazide (HYDRODIURIL) 12.5 MG tablet    Sig: Take 1 tablet (12.5 mg total) by mouth daily.    Dispense:  90 tablet    Refill:  3    Return in about 6 months (around 11/15/2016) for recheck high blood pressure, high cholesterol, anxiety/depression.   Merion Grimaldo Paulita Fujita, M.D. [facility] [address] [place], [place]  [address] [phone] phone [phone] fax

## 2016-05-18 NOTE — Patient Instructions (Signed)
   IF you received an x-ray today, you will receive an invoice from [facility]. Please contact [facility] at [phone] with questions or concerns regarding your invoice.   IF you received labwork today, you will receive an invoice from [facility]. Please contact [facility] at [phone] with questions or concerns regarding your invoice.   Our billing staff will not be able to assist you with questions regarding bills from these companies.  You will be contacted with the lab results as soon as they are available. The fastest way to get your results is to activate your My Chart account. Instructions are located on the last page of this paperwork. If you have not heard from us regarding the results in 2 weeks, please contact this office.     Keeping You Healthy  Get These Tests  Blood Pressure- Have your blood pressure checked by your healthcare provider at least once a year.  Normal blood pressure is 120/80.  Weight- Have your body mass index (BMI) calculated to screen for obesity.  BMI is a measure of body fat based on height and weight.  You can calculate your own BMI at www.nhlbisupport.com/bmi/  Cholesterol- Have your cholesterol checked every year.  Diabetes- Have your blood sugar checked every year if you have high blood pressure, high cholesterol, a family history of diabetes or if you are overweight.  Pap Test - Have a pap test every 1 to 5 years if you have been sexually active.  If you are older than 65 and recent pap tests have been normal you may not need additional pap tests.  In addition, if you have had a hysterectomy  for benign disease additional pap tests are not necessary.  Mammogram-Yearly mammograms are essential for early detection of breast cancer  Screening for Colon Cancer- Colonoscopy starting at age 50. Screening may begin sooner depending on your family history and other health conditions.  Follow up colonoscopy as directed by your  Gastroenterologist.  Screening for Osteoporosis- Screening begins at age 65 with bone density scanning, sooner if you are at higher risk for developing Osteoporosis.  Get these medicines  Calcium with Vitamin D- Your body requires 1200-1500 mg of Calcium a day and 800-1000 IU of Vitamin D a day.  You can only absorb 500 mg of Calcium at a time therefore Calcium must be taken in 2 or 3 separate doses throughout the day.  Hormones- Hormone therapy has been associated with increased risk for certain cancers and heart disease.  Talk to your healthcare provider about if you need relief from menopausal symptoms.  Aspirin- Ask your healthcare provider about taking Aspirin to prevent Heart Disease and Stroke.  Get these Immuniztions  Flu shot- Every fall  Pneumonia shot- Once after the age of 65; if you are younger ask your healthcare provider if you need a pneumonia shot.  Tetanus- Every ten years.  Zostavax- Once after the age of 60 to prevent shingles.  Take these steps  Don't smoke- Your healthcare provider can help you quit. For tips on how to quit, ask your healthcare provider or go to www.smokefree.gov or call 1-800 QUIT-NOW.  Be physically active- Exercise 5 days a week for a minimum of 30 minutes.  If you are not already physically active, start slow and gradually work up to 30 minutes of moderate physical activity.  Try walking, dancing, bike riding, swimming, etc.  Eat a healthy diet- Eat a variety of healthy foods such as fruits, vegetables, whole grains, low   fat milk, low fat cheeses, yogurt, lean meats, chicken, fish, eggs, dried beans, tofu, etc.  For more information go to www.thenutritionsource.org  Dental visit- Brush and floss teeth twice daily; visit your dentist twice a year.  Eye exam- Visit your Optometrist or Ophthalmologist yearly.  Drink alcohol in moderation- Limit alcohol intake to one drink or less a day.  Never drink and drive.  Depression- Your emotional  health is as important as your physical health.  If you're feeling down or losing interest in things you normally enjoy, please talk to your healthcare provider.  Seat Belts- can save your life; always wear one  Smoke/Carbon Monoxide detectors- These detectors need to be installed on the appropriate level of your home.  Replace batteries at least once a year.  Violence- If anyone is threatening or hurting you, please tell your healthcare provider.  Living Will/ Health care power of attorney- Discuss with your healthcare provider and family.  

## 2016-05-19 LAB — HEMOGLOBIN A1C
Est. average glucose Bld gHb Est-mCnc: 120 mg/dL
Hgb A1c MFr Bld: 5.8 % — ABNORMAL HIGH (ref 4.8–5.6)

## 2016-05-19 LAB — CBC WITH DIFFERENTIAL/PLATELET
BASOS ABS: 0 10*3/uL (ref 0.0–0.2)
Basos: 1 %
EOS (ABSOLUTE): 0.2 10*3/uL (ref 0.0–0.4)
Eos: 5 %
HEMOGLOBIN: 11.9 g/dL (ref 11.1–15.9)
Hematocrit: 37.3 % (ref 34.0–46.6)
Immature Grans (Abs): 0 10*3/uL (ref 0.0–0.1)
Immature Granulocytes: 0 %
LYMPHS ABS: 1.3 10*3/uL (ref 0.7–3.1)
Lymphs: 36 %
MCH: 25.3 pg — ABNORMAL LOW (ref 26.6–33.0)
MCHC: 31.9 g/dL (ref 31.5–35.7)
MCV: 79 fL (ref 79–97)
MONOCYTES: 9 %
MONOS ABS: 0.3 10*3/uL (ref 0.1–0.9)
Neutrophils Absolute: 1.8 10*3/uL (ref 1.4–7.0)
Neutrophils: 49 %
Platelets: 283 10*3/uL (ref 150–379)
RBC: 4.7 x10E6/uL (ref 3.77–5.28)
RDW: 13.6 % (ref 12.3–15.4)
WBC: 3.6 10*3/uL (ref 3.4–10.8)

## 2016-05-19 LAB — COMPREHENSIVE METABOLIC PANEL
A/G RATIO: 1.5 (ref 1.2–2.2)
ALT: 12 IU/L (ref 0–32)
AST: 14 IU/L (ref 0–40)
Albumin: 4.1 g/dL (ref 3.6–4.8)
Alkaline Phosphatase: 93 IU/L (ref 39–117)
BUN/Creatinine Ratio: 21 (ref 12–28)
BUN: 19 mg/dL (ref 8–27)
Bilirubin Total: 0.3 mg/dL (ref 0.0–1.2)
CALCIUM: 8.9 mg/dL (ref 8.7–10.3)
CHLORIDE: 104 mmol/L (ref 96–106)
CO2: 24 mmol/L (ref 18–29)
Creatinine, Ser: 0.92 mg/dL (ref 0.57–1.00)
GFR calc Af Amer: 76 mL/min/{1.73_m2} (ref >59)
GFR, EST NON AFRICAN AMERICAN: 66 mL/min/{1.73_m2} (ref >59)
Globulin, Total: 2.8 g/dL (ref 1.5–4.5)
Glucose: 94 mg/dL (ref 65–99)
POTASSIUM: 4.5 mmol/L (ref 3.5–5.2)
Sodium: 141 mmol/L (ref 134–144)
Total Protein: 6.9 g/dL (ref 6.0–8.5)

## 2016-05-19 LAB — LIPID PANEL
CHOL/HDL RATIO: 2.7 ratio (ref 0.0–4.4)
Cholesterol, Total: 198 mg/dL (ref 100–199)
HDL: 73 mg/dL (ref >39)
LDL Calculated: 106 mg/dL — ABNORMAL HIGH (ref 0–99)
TRIGLYCERIDES: 95 mg/dL (ref 0–149)
VLDL CHOLESTEROL CAL: 19 mg/dL (ref 5–40)

## 2016-05-19 LAB — TSH: TSH: 2.36 u[IU]/mL (ref 0.450–4.500)

## 2016-05-20 LAB — PAP IG W/ RFLX HPV ASCU: PAP SMEAR COMMENT: 0

## 2016-05-31 DIAGNOSIS — I10 Essential (primary) hypertension: Secondary | ICD-10-CM | POA: Insufficient documentation

## 2016-06-15 ENCOUNTER — Ambulatory Visit
Admission: RE | Admit: 2016-06-15 | Discharge: 2016-06-15 | Disposition: A | Discharge status: Home / Self Care | Payer: Medicare Other | Source: Ambulatory Visit | Attending: Family Medicine | Admitting: Family Medicine

## 2016-06-15 DIAGNOSIS — N6324 Unspecified lump in the left breast, lower inner quadrant: Secondary | ICD-10-CM | POA: Diagnosis not present

## 2016-06-15 DIAGNOSIS — N63 Unspecified lump in unspecified breast: Secondary | ICD-10-CM

## 2016-08-26 ENCOUNTER — Ambulatory Visit (INDEPENDENT_AMBULATORY_CARE_PROVIDER_SITE_OTHER): Payer: Medicare Other | Admitting: Emergency Medicine

## 2016-08-26 ENCOUNTER — Ambulatory Visit (HOSPITAL_COMMUNITY)
Admission: RE | Admit: 2016-08-26 | Discharge: 2016-08-26 | Disposition: A | Discharge status: Home / Self Care | Payer: Medicare Other | Source: Ambulatory Visit | Attending: Emergency Medicine | Admitting: Emergency Medicine

## 2016-08-26 VITALS — BP 115/72 | HR 58 | Temp 98.9°F | Resp 16 | Ht 65.0 in | Wt 262.2 lb

## 2016-08-26 DIAGNOSIS — I10 Essential (primary) hypertension: Secondary | ICD-10-CM | POA: Diagnosis not present

## 2016-08-26 DIAGNOSIS — R413 Other amnesia: Secondary | ICD-10-CM | POA: Diagnosis not present

## 2016-08-26 DIAGNOSIS — R400 Somnolence: Secondary | ICD-10-CM | POA: Diagnosis not present

## 2016-08-26 DIAGNOSIS — G459 Transient cerebral ischemic attack, unspecified: Secondary | ICD-10-CM

## 2016-08-26 DIAGNOSIS — R55 Syncope and collapse: Secondary | ICD-10-CM | POA: Insufficient documentation

## 2016-08-26 DIAGNOSIS — E236 Other disorders of pituitary gland: Secondary | ICD-10-CM | POA: Insufficient documentation

## 2016-08-26 LAB — BASIC METABOLIC PANEL
BUN/Creatinine Ratio: 25 (ref 12–28)
BUN: 20 mg/dL (ref 8–27)
CO2: 24 mmol/L (ref 18–29)
CREATININE: 0.81 mg/dL (ref 0.57–1.00)
Calcium: 10 mg/dL (ref 8.7–10.3)
Chloride: 98 mmol/L (ref 96–106)
GFR, EST AFRICAN AMERICAN: 88 mL/min/{1.73_m2} (ref >59)
GFR, EST NON AFRICAN AMERICAN: 76 mL/min/{1.73_m2} (ref >59)
Glucose: 106 mg/dL — ABNORMAL HIGH (ref 65–99)
Potassium: 4.1 mmol/L (ref 3.5–5.2)
Sodium: 139 mmol/L (ref 134–144)

## 2016-08-26 LAB — CREATININE, SERUM
CREATININE: 1.01 mg/dL — AB (ref 0.44–1.00)
GFR calc Af Amer: >60 mL/min (ref >60)
GFR, EST NON AFRICAN AMERICAN: 57 mL/min — AB (ref >60)

## 2016-08-26 MED ORDER — GADOBENATE DIMEGLUMINE 529 MG/ML IV SOLN
20.0000 mL | Freq: Once | INTRAVENOUS | Status: AC | PRN
  Administered 2016-08-26: 20 mL via INTRAVENOUS

## 2016-08-26 NOTE — Progress Notes (Signed)
[name] [name] 66 y.o.   Chief Complaint  Patient presents with  . Memory Loss    08/25/2016 while at the gym walking    HISTORY OF PRESENT ILLNESS: This is a 66 y.o. female was walking with sister yesterday 1pm (about 4 hours after having light breakfast) when she suddenly started feeling weak and lightheaded; had to lean against the wall and doesn't remember details; sister states her hands felt cold and she was unresponsive but eyes still open with a blank stare; episode lasted several minutes; pt recovered fully after a brief period of confusion. Had an uneventful afternoon; had lunch but felt tired. No additional symptoms. Had similar episode last week.  HPI   Prior to Admission medications   Medication Sig Start Date End Date Taking? Authorizing Provider  aspirin 81 MG chewable tablet Chew 81 mg by mouth daily.   Yes Historical Provider, MD  carvedilol (COREG) 3.125 MG tablet Take 1 tablet (3.125 mg total) by mouth 2 (two) times daily with a meal. 05/18/16  Yes Ethelda Chick, MD  escitalopram (LEXAPRO) 10 MG tablet Take 1 tablet (10 mg total) by mouth daily. 05/18/16  Yes Ethelda Chick, MD  hydrochlorothiazide (HYDRODIURIL) 12.5 MG tablet Take 1 tablet (12.5 mg total) by mouth daily. 05/18/16  Yes Ethelda Chick, MD  lisinopril (PRINIVIL,ZESTRIL) 40 MG tablet Take 1 tablet (40 mg total) by mouth daily. 05/18/16  Yes Ethelda Chick, MD  Multiple Vitamins-Minerals (MULTIVITAMIN PO) Take 1 tablet by mouth daily.   Yes Historical Provider, MD  simvastatin (ZOCOR) 20 MG tablet Take 1 tablet (20 mg total) by mouth daily. 05/18/16  Yes Ethelda Chick, MD    No Known Allergies  Patient Active Problem List   Diagnosis Date Noted  . Near syncope 08/26/2016  . Transient cerebral ischemia 08/26/2016  . Transient memory loss 08/26/2016  . Daytime sleepiness 08/26/2016  . Morbid obesity (HCC) 05/31/2016  . Glucose intolerance (impaired glucose tolerance) 05/18/2016  . Plantar fasciitis  04/19/2014  . 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 Pain in lower limb 04/19/2014  . Generalized anxiety disorder 03/15/2014  . Gait disorder 03/14/2014  . Memory difficulty 03/14/2014  . Debility 02/01/2014  . Demand myocardial ischemic related infarction 01/23/2014    Class: Diagnosis of  . Bacterial meningitis-01/18/2014-s.pneumo 01/18/2014  . Depression, reactive 10/12/2012  . Chronic neutropenia (HCC) 04/14/2012  . Hyperlipemia 10/20/2011  . Anemia 10/20/2011  . HTN (hypertension) 10/20/2011    Past Medical History:  Diagnosis Date  . Anxiety   . Cataract   . Depression   . Gait disorder 03/14/2014  . HTN (hypertension)   . Hx of cardiovascular stress test    Lexiscan Myoview (1/16):  No ischemia, EF 68%, breast atten; Low Risk  . Hyperlipemia   . Memory difficulty 03/14/2014  . Meningitis due to bacteria 01-18-14    Past Surgical History:  Procedure Laterality Date  . NO PAST SURGERIES      Social History   Social History  . Marital status: Divorced    Spouse name: N/A  . Number of children: 1  . Years of education: college   Occupational History  . Retired [employer]   Social History Main Topics  . Smoking status: Former Smoker    Packs/day: 0.50    Years: 40.00    Types: Cigarettes    Quit date: 07/09/2011  . Smokeless tobacco: Never Used  . Alcohol use No  . Drug use: No  .  Sexual activity: No   Other Topics Concern  . Not on file   Social History Narrative   Marital status: divorced x 35 years; not dating; not interested in 2018      Children: 1 child/son (44); 2 grandchildren; [place]      Lives:  Alone in [place] in [place].      Employment: retired 2008; Administrator, arts.      Tobacco: quit smoking in 2013.  Smoked x many years       Alcohol: none; holidays only      Exercise: none; quit exercising two years ago in 2015.  Plans to join [employer]      Seatbelt: 100%; no texting. Drives locally        ADLs: drives locally; independent with ADLs.  Has cane for long distances      Advanced Directives:          Patient is single lives at home alone, has 1 child   Patient is right handed   Education level is 1 year of college   Caffeine consumption is 3-4 cups daily    Family History  Problem Relation Age of Onset  . Heart attack Mother 64  . Stroke Mother   . Hypertension Mother   . Heart attack Father 24  . Cerebral aneurysm Sister   . Hypertension Sister   . Kidney disease Sister     ESRD; HD in last year  . Hypertension Brother   . Hyperlipidemia Brother   . Rheum arthritis Sister   . Arthritis Sister   . Hypertension Brother   . Hyperlipidemia Brother   . Hyperlipidemia Brother   . Hypertension Brother      Review of Systems  Constitutional: Negative for chills, diaphoresis, fever and weight loss.  HENT: Negative.  Negative for congestion, ear pain, nosebleeds, sinus pain and sore throat.   Eyes: Negative.  Negative for blurred vision, double vision, photophobia and pain.  Respiratory: Negative.  Negative for cough, shortness of breath and wheezing.   Cardiovascular: Negative.  Negative for chest pain, palpitations, claudication and leg swelling.  Gastrointestinal: Negative.  Negative for abdominal pain, blood in stool, diarrhea, melena, nausea and vomiting.  Genitourinary: Negative.  Negative for dysuria, hematuria and urgency.  Musculoskeletal: Negative for joint pain and myalgias.  Skin: Negative.  Negative for rash.  Neurological: Positive for dizziness and weakness (transient). Negative for tingling, sensory change, speech change, focal weakness, seizures, loss of consciousness and headaches.  Endo/Heme/Allergies: Negative.   All other systems reviewed and are negative.  Vitals:   08/26/16 1346  BP: 115/72  Pulse: (!) 58  Resp: 16  Temp: 98.9 F (37.2 C)     Physical Exam  Constitutional: She is oriented to person, place, and time. She appears  well-developed.  Obese.  HENT:  Head: Normocephalic and atraumatic.  Nose: Nose normal.  Mouth/Throat: Oropharynx is clear and moist. No oropharyngeal exudate.  Eyes: Conjunctivae and EOM are normal. Pupils are equal, round, and reactive to light.  Neck: Normal range of motion. Neck supple. No JVD present. Carotid bruit is present. No thyromegaly present.  Cardiovascular: Normal rate, regular rhythm, normal heart sounds and intact distal pulses.   Pulmonary/Chest: Effort normal and breath sounds normal.  Abdominal: Soft. Bowel sounds are normal. She exhibits no distension. There is no tenderness.  Musculoskeletal: Normal range of motion. She exhibits no edema or tenderness.  Lymphadenopathy:    She has no cervical adenopathy.  Neurological: She is  alert and oriented to person, place, and time. She displays normal reflexes. No cranial nerve deficit or sensory deficit. She exhibits normal muscle tone. Coordination normal.  Skin: Skin is warm and dry. Capillary refill takes less than 2 seconds. No rash noted.  Psychiatric: She has a normal mood and affect. Her behavior is normal.  Vitals reviewed.  Results for orders placed or performed in visit on 08/26/16 (from the past 24 hour(s))  Basic metabolic panel     Status: Abnormal   Collection Time: 08/26/16 12:00 AM  Result Value Ref Range   Glucose 106 (H) 65 - 99 mg/dL   BUN 20 8 - 27 mg/dL   Creatinine, Ser 1.61 0.57 - 1.00 mg/dL   GFR calc non Af Amer 76 >59 mL/min/1.73   GFR calc Af Amer 88 >59 mL/min/1.73   BUN/Creatinine Ratio 25 12 - 28   Sodium 139 134 - 144 mmol/L   Potassium 4.1 3.5 - 5.2 mmol/L   Chloride 98 96 - 106 mmol/L   CO2 24 18 - 29 mmol/L   Calcium 10.0 8.7 - 10.3 mg/dL   Narrative   Performed at:  [address], [place], [place]  [id number] Lab Director: Mila Homer MD, Phone:  [phone]   Brain MRI done today: IMPRESSION: 1. No acute intracranial infarct or other process  identified. 2. Partially empty sella with prominent CSF within Meckel's cave bilaterally. This appearance can be seen in the setting of idiopathic intracranial hypertension (pseudotumor cerebri the), but could also reflect a normal anatomic variant. Correlation with CSF opening pressure could be performed for confirmatory purposes as clinically indicated. 3. Otherwise normal brain MRI.   ASSESSMENT & PLAN: Maryna was seen today for memory loss.  Diagnoses and all orders for this visit:  Near syncope Comments: r/o seizure episode Orders: -     Ambulatory referral to Neurology  Transient cerebral ischemia, unspecified type -     Ambulatory referral to Neurology -     MR Brain W Wo Contrast; Future  Transient memory loss  Daytime sleepiness -     Ambulatory referral to Sleep Studies  Essential hypertension -     Basic metabolic panel    Patient Instructions    Will schedule Brain MRI stat.  GO TO [place] NOW 08/26/16 FOR MRI [address] [place]  IF you received an x-ray today, you will receive an invoice from [facility]. Please contact [facility] at [phone] with questions or concerns regarding your invoice.   IF you received labwork today, you will receive an invoice from [facility]. Please contact [facility] at [phone] with questions or concerns regarding your invoice.   Our billing staff will not be able to assist you with questions regarding bills from these companies.  You will be contacted with the lab results as soon as they are available. The fastest way to get your results is to activate your My Chart account. Instructions are located on the last page of this paperwork. If you have not heard from [place] regarding the results in 2 weeks, please contact this office.     Transient Ischemic Attack A transient ischemic attack (TIA) is a "warning stroke" that causes stroke-like symptoms. A TIA does not cause lasting damage to the  brain. The symptoms of a TIA can happen fast and do not last long. It is important to know the symptoms of a TIA and what to do. This can help prevent stroke or death. Follow  these instructions at home:  Take medicines only as told by your doctor. Make sure you understand all of the instructions.  You may need to take aspirin or warfarin medicine. Warfarin needs to be taken exactly as told.  Taking too much or too little warfarin is dangerous. Blood tests must be done as often as told by your doctor. A PT blood test measures how long it takes for blood to clot. Your PT is used to calculate another value called an INR. Your PT and INR help your doctor adjust your warfarin dosage. He or she will make sure you are taking the right amount.  Food can cause problems with warfarin and affect the results of your blood tests. This is true for foods high in vitamin K. Eat the same amount of foods high in vitamin K each day. Foods high in vitamin K include spinach, kale, broccoli, cabbage, collard and turnip greens, Brussels sprouts, peas, cauliflower, seaweed, and parsley. Other foods high in vitamin K include beef and pork liver, green tea, and soybean oil. Eat the same amount of foods high in vitamin K each day. Avoid big changes in your diet. Tell your doctor before changing your diet. Talk to a food specialist (dietitian) if you have questions.  Many medicines can cause problems with warfarin and affect your PT and INR. Tell your doctor about all medicines you take. This includes vitamins and dietary pills (supplements). Do not take or stop taking any prescribed or over-the-counter medicines unless your doctor tells you to.  Warfarin can cause more bruising or bleeding. Hold pressure over any cuts for longer than normal. Talk to your doctor about other side effects of warfarin.  Avoid sports or activities that may cause injury or bleeding.  Be careful when you shave, floss, or use sharp objects.  Avoid  or drink very little alcohol while taking warfarin. Tell your doctor if you change how much alcohol you drink.  Tell your dentist and other doctors that you take warfarin before any procedures.  Follow your diet program as told, if you are given one.  Keep a healthy weight.  Stay active. Try to get at least 30 minutes of activity on all or most days.  Do not use any tobacco products, including cigarettes, chewing tobacco, or electronic cigarettes. If you need help quitting, ask your doctor.  Limit alcohol intake to no more than 1 drink per day for nonpregnant women and 2 drinks per day for men. One drink equals 12 ounces of beer, 5 ounces of wine, or 1 ounces of hard liquor.  Do not abuse drugs.  Keep your home safe so you do not fall. You can do this by:  Putting grab bars in the bedroom and bathroom.  Raising toilet seats.  Putting a seat in the shower.  Keep all follow-up visits as told by your doctor. This is important. Contact a doctor if:  Your personality changes.  You have trouble swallowing.  You have double vision.  You are dizzy.  You have a fever. Get help right away if: These symptoms may be an emergency. Do not wait to see if the symptoms will go away. Get medical help right away. Call your local emergency services (911 in the [place] Do not drive yourself to the hospital.  You have sudden weakness or lose feeling (go numb), especially on one side of the body. This can affect your:  Face.  Arm.  Leg.  You have sudden trouble walking.  You  have sudden trouble moving your arms or legs.  You have sudden confusion.  You have trouble talking.  You have trouble understanding.  You have sudden trouble seeing in one or both eyes.  You lose your balance.  Your movements are not smooth.  You have a sudden, very bad headache with no known cause.  You have new chest pain.  Your heartbeat is unsteady.  You are partly or totally unaware of what is  going on around you. This information is not intended to replace advice given to you by your health care provider. Make sure you discuss any questions you have with your health care provider. Document Released: 01/27/2008 Document Revised: 12/22/2015 Document Reviewed: 07/25/2013 Elsevier Interactive Patient Education  2017 Elsevier Inc.      Edwina Barth, MD [facility]

## 2016-08-26 NOTE — Patient Instructions (Addendum)
Will schedule Brain MRI stat.  GO TO [place] NOW 08/26/16 FOR MRI [address] [place]  IF you received an x-ray today, you will receive an invoice from [facility]. Please contact [facility] at [phone] with questions or concerns regarding your invoice.   IF you received labwork today, you will receive an invoice from [facility]. Please contact [facility] at [phone] with questions or concerns regarding your invoice.   Our billing staff will not be able to assist you with questions regarding bills from these companies.  You will be contacted with the lab results as soon as they are available. The fastest way to get your results is to activate your My Chart account. Instructions are located on the last page of this paperwork. If you have not heard from [place] regarding the results in 2 weeks, please contact this office.     Transient Ischemic Attack A transient ischemic attack (TIA) is a "warning stroke" that causes stroke-like symptoms. A TIA does not cause lasting damage to the brain. The symptoms of a TIA can happen fast and do not last long. It is important to know the symptoms of a TIA and what to do. This can help prevent stroke or death. Follow these instructions at home:  Take medicines only as told by your doctor. Make sure you understand all of the instructions.  You may need to take aspirin or warfarin medicine. Warfarin needs to be taken exactly as told.  Taking too much or too little warfarin is dangerous. Blood tests must be done as often as told by your doctor. A PT blood test measures how long it takes for blood to clot. Your PT is used to calculate another value called an INR. Your PT and INR help your doctor adjust your warfarin dosage. He or she will make sure you are taking the right amount.  Food can cause problems with warfarin and affect the results of your blood tests. This is true for foods high in vitamin K. Eat the same amount of foods  high in vitamin K each day. Foods high in vitamin K include spinach, kale, broccoli, cabbage, collard and turnip greens, Brussels sprouts, peas, cauliflower, seaweed, and parsley. Other foods high in vitamin K include beef and pork liver, green tea, and soybean oil. Eat the same amount of foods high in vitamin K each day. Avoid big changes in your diet. Tell your doctor before changing your diet. Talk to a food specialist (dietitian) if you have questions.  Many medicines can cause problems with warfarin and affect your PT and INR. Tell your doctor about all medicines you take. This includes vitamins and dietary pills (supplements). Do not take or stop taking any prescribed or over-the-counter medicines unless your doctor tells you to.  Warfarin can cause more bruising or bleeding. Hold pressure over any cuts for longer than normal. Talk to your doctor about other side effects of warfarin.  Avoid sports or activities that may cause injury or bleeding.  Be careful when you shave, floss, or use sharp objects.  Avoid or drink very little alcohol while taking warfarin. Tell your doctor if you change how much alcohol you drink.  Tell your dentist and other doctors that you take warfarin before any procedures.  Follow your diet program as told, if you are given one.  Keep a healthy weight.  Stay active. Try to get at least 30 minutes of activity on all or most days.  Do not use any tobacco products,  including cigarettes, chewing tobacco, or electronic cigarettes. If you need help quitting, ask your doctor.  Limit alcohol intake to no more than 1 drink per day for nonpregnant women and 2 drinks per day for men. One drink equals 12 ounces of beer, 5 ounces of wine, or 1 ounces of hard liquor.  Do not abuse drugs.  Keep your home safe so you do not fall. You can do this by:  Putting grab bars in the bedroom and bathroom.  Raising toilet seats.  Putting a seat in the shower.  Keep all  follow-up visits as told by your doctor. This is important. Contact a doctor if:  Your personality changes.  You have trouble swallowing.  You have double vision.  You are dizzy.  You have a fever. Get help right away if: These symptoms may be an emergency. Do not wait to see if the symptoms will go away. Get medical help right away. Call your local emergency services (911 in the [place] Do not drive yourself to the hospital.  You have sudden weakness or lose feeling (go numb), especially on one side of the body. This can affect your:  Face.  Arm.  Leg.  You have sudden trouble walking.  You have sudden trouble moving your arms or legs.  You have sudden confusion.  You have trouble talking.  You have trouble understanding.  You have sudden trouble seeing in one or both eyes.  You lose your balance.  Your movements are not smooth.  You have a sudden, very bad headache with no known cause.  You have new chest pain.  Your heartbeat is unsteady.  You are partly or totally unaware of what is going on around you. This information is not intended to replace advice given to you by your health care provider. Make sure you discuss any questions you have with your health care provider. Document Released: 01/27/2008 Document Revised: 12/22/2015 Document Reviewed: 07/25/2013 Elsevier Interactive Patient Education  2017 [employer].

## 2016-08-28 ENCOUNTER — Encounter: Payer: Self-pay | Admitting: Emergency Medicine

## 2016-08-30 ENCOUNTER — Telehealth: Payer: Self-pay | Admitting: Emergency Medicine

## 2016-08-30 NOTE — Telephone Encounter (Signed)
When is she scheduled to see Neurologist?

## 2016-08-30 NOTE — Telephone Encounter (Signed)
Looks like referral information was sent to [facility] on 4/27 and then to [facility] in [facility] on 4/30. I do not see that an appointment is scheduled yet.

## 2016-09-09 NOTE — Telephone Encounter (Signed)
Spoke to patient and gave MRI report. Questions answered. Will follow up with Neurologist and also sleep apnea specialist.    By Georgina Quint, MD

## 2016-09-25 ENCOUNTER — Emergency Department (HOSPITAL_COMMUNITY)
Admission: EM | Admit: 2016-09-25 | Discharge: 2016-09-25 | Disposition: A | Discharge status: Home / Self Care | Payer: Medicare Other | Attending: Emergency Medicine | Admitting: Emergency Medicine

## 2016-09-25 ENCOUNTER — Emergency Department (HOSPITAL_COMMUNITY): Payer: Medicare Other

## 2016-09-25 ENCOUNTER — Encounter (HOSPITAL_COMMUNITY): Payer: Self-pay | Admitting: Emergency Medicine

## 2016-09-25 DIAGNOSIS — R4189 Other symptoms and signs involving cognitive functions and awareness: Secondary | ICD-10-CM

## 2016-09-25 DIAGNOSIS — R4789 Other speech disturbances: Secondary | ICD-10-CM | POA: Insufficient documentation

## 2016-09-25 DIAGNOSIS — Z79899 Other long term (current) drug therapy: Secondary | ICD-10-CM | POA: Diagnosis not present

## 2016-09-25 DIAGNOSIS — Z5181 Encounter for therapeutic drug level monitoring: Secondary | ICD-10-CM | POA: Diagnosis not present

## 2016-09-25 DIAGNOSIS — Z7982 Long term (current) use of aspirin: Secondary | ICD-10-CM | POA: Diagnosis not present

## 2016-09-25 DIAGNOSIS — Z87891 Personal history of nicotine dependence: Secondary | ICD-10-CM | POA: Diagnosis not present

## 2016-09-25 DIAGNOSIS — I10 Essential (primary) hypertension: Secondary | ICD-10-CM | POA: Diagnosis not present

## 2016-09-25 DIAGNOSIS — Z8673 Personal history of transient ischemic attack (TIA), and cerebral infarction without residual deficits: Secondary | ICD-10-CM | POA: Insufficient documentation

## 2016-09-25 DIAGNOSIS — R4020 Unspecified coma: Secondary | ICD-10-CM | POA: Diagnosis not present

## 2016-09-25 HISTORY — DX: Transient cerebral ischemic attack, unspecified: G45.9

## 2016-09-25 LAB — COMPREHENSIVE METABOLIC PANEL
ALBUMIN: 3.8 g/dL (ref 3.5–5.0)
ALT: 15 U/L (ref 14–54)
AST: 22 U/L (ref 15–41)
Alkaline Phosphatase: 85 U/L (ref 38–126)
Anion gap: 9 (ref 5–15)
BUN: 23 mg/dL — AB (ref 6–20)
CO2: 24 mmol/L (ref 22–32)
CREATININE: 0.97 mg/dL (ref 0.44–1.00)
Calcium: 9.5 mg/dL (ref 8.9–10.3)
Chloride: 104 mmol/L (ref 101–111)
GFR calc Af Amer: >60 mL/min (ref >60)
GFR calc non Af Amer: 60 mL/min — ABNORMAL LOW (ref >60)
GLUCOSE: 124 mg/dL — AB (ref 65–99)
POTASSIUM: 3.9 mmol/L (ref 3.5–5.1)
SODIUM: 137 mmol/L (ref 135–145)
Total Bilirubin: 0.3 mg/dL (ref 0.3–1.2)
Total Protein: 6.9 g/dL (ref 6.5–8.1)

## 2016-09-25 LAB — CBC
HEMATOCRIT: 38.6 % (ref 36.0–46.0)
HEMOGLOBIN: 11.9 g/dL — AB (ref 12.0–15.0)
MCH: 25.6 pg — ABNORMAL LOW (ref 26.0–34.0)
MCHC: 30.8 g/dL (ref 30.0–36.0)
MCV: 83 fL (ref 78.0–100.0)
Platelets: 264 10*3/uL (ref 150–400)
RBC: 4.65 MIL/uL (ref 3.87–5.11)
RDW: 13.3 % (ref 11.5–15.5)
WBC: 4.2 10*3/uL (ref 4.0–10.5)

## 2016-09-25 LAB — I-STAT TROPONIN, ED: Troponin i, poc: 0 ng/mL (ref 0.00–0.08)

## 2016-09-25 LAB — I-STAT CHEM 8, ED
BUN: 26 mg/dL — ABNORMAL HIGH (ref 6–20)
CHLORIDE: 104 mmol/L (ref 101–111)
CREATININE: 1 mg/dL (ref 0.44–1.00)
Calcium, Ion: 1.18 mmol/L (ref 1.15–1.40)
Glucose, Bld: 122 mg/dL — ABNORMAL HIGH (ref 65–99)
HEMATOCRIT: 37 % (ref 36.0–46.0)
Hemoglobin: 12.6 g/dL (ref 12.0–15.0)
POTASSIUM: 3.8 mmol/L (ref 3.5–5.1)
SODIUM: 140 mmol/L (ref 135–145)
TCO2: 27 mmol/L (ref 0–100)

## 2016-09-25 LAB — PROTIME-INR
INR: 1.01
Prothrombin Time: 13.3 seconds (ref 11.4–15.2)

## 2016-09-25 LAB — DIFFERENTIAL
BASOS ABS: 0 10*3/uL (ref 0.0–0.1)
BASOS PCT: 0 %
EOS ABS: 0.1 10*3/uL (ref 0.0–0.7)
Eosinophils Relative: 2 %
Lymphocytes Relative: 29 %
Lymphs Abs: 1.2 10*3/uL (ref 0.7–4.0)
Monocytes Absolute: 0.4 10*3/uL (ref 0.1–1.0)
Monocytes Relative: 9 %
NEUTROS ABS: 2.5 10*3/uL (ref 1.7–7.7)
NEUTROS PCT: 60 %

## 2016-09-25 LAB — CBG MONITORING, ED: Glucose-Capillary: 115 mg/dL — ABNORMAL HIGH (ref 65–99)

## 2016-09-25 LAB — APTT: APTT: 34 s (ref 24–36)

## 2016-09-25 NOTE — Discharge Instructions (Signed)
Until you are seen by a neurologist, do not operate a vehicle, or any other heavy machinery.

## 2016-09-25 NOTE — ED Notes (Signed)
Patient to CT.

## 2016-09-25 NOTE — ED Notes (Signed)
ED Provider at bedside. 

## 2016-09-25 NOTE — ED Notes (Signed)
Patient return from CT and placed on monitor.

## 2016-09-25 NOTE — ED Triage Notes (Addendum)
Pt brought in by family for 2 episodes of starring and being unresponsive for 1-2 minutes at a time. 1st episode occurred at 1 pm and the 2nd episode occurred at 5 pm. Pt currently AAOx3. No facial droop, no arm drift, speech clear. Pt has history of same in past and has been seen by neurology.

## 2016-09-25 NOTE — ED Notes (Signed)
Family at bedside. 

## 2016-09-25 NOTE — ED Provider Notes (Signed)
[facility]ENCY DEPT Provider Note   CSN: [id number] Arrival date & time: 09/25/16  1748     History   Chief Complaint Chief Complaint  Patient presents with  . Other    unresponsive    HPI [name] [name] is a 66 y.o. female.  Patient reports she has been evaluated for similar episodes in the past. Most recently 2 weeks ago she was seen by her primary care doctor and had an MRI brain which was within normal limits. Today the patient reports that she was told by a friend that she had 2 episodes where she had staring off spells, lasted 1-2 minutes at a time. She was told that she did not have any facial droop or weakness. Would spontaneously resolve. Would not have any postictal symptoms. Denies fevers or chills. No significant nausea, vomiting, diarrhea, headache, vision changes.   The history is provided by the patient.  Illness  This is a recurrent problem. Episode onset: 2 different occasions today. The problem occurs rarely. The problem has been resolved. Pertinent negatives include no chest pain, no abdominal pain, no headaches and no shortness of breath. She has tried nothing for the symptoms.    Past Medical History:  Diagnosis Date  . Anxiety   . Cataract   . Depression   . Gait disorder 03/14/2014  . HTN (hypertension)   . Hx of cardiovascular stress test    Lexiscan Myoview (1/16):  No ischemia, EF 68%, breast atten; Low Risk  . Hyperlipemia   . Memory difficulty 03/14/2014  . Meningitis due to bacteria 01-18-14  . TIA (transient ischemic attack)     Patient Active Problem List   Diagnosis Date Noted  . Near syncope 08/26/2016  . Transient cerebral ischemia 08/26/2016  . Transient memory loss 08/26/2016  . Daytime sleepiness 08/26/2016  . Morbid obesity (HCC) 05/31/2016  . Glucose intolerance (impaired glucose tolerance) 05/18/2016  . Plantar fasciitis 04/19/2014  . 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 Pain in lower limb 04/19/2014  .  Generalized anxiety disorder 03/15/2014  . Gait disorder 03/14/2014  . Memory difficulty 03/14/2014  . Debility 02/01/2014  . Demand myocardial ischemic related infarction 01/23/2014    Class: Diagnosis of  . Bacterial meningitis-01/18/2014-s.pneumo 01/18/2014  . Depression, reactive 10/12/2012  . Chronic neutropenia (HCC) 04/14/2012  . Hyperlipemia 10/20/2011  . Anemia 10/20/2011  . HTN (hypertension) 10/20/2011    Past Surgical History:  Procedure Laterality Date  . NO PAST SURGERIES      OB History    No data available       Home Medications    Prior to Admission medications   Medication Sig Start Date End Date Taking? Authorizing Provider  aspirin 81 MG chewable tablet Chew 81 mg by mouth daily.    [provider]  carvedilol (COREG) 3.125 MG tablet Take 1 tablet (3.125 mg total) by mouth 2 (two) times daily with a meal. 05/18/16   Ethelda Chick, MD  escitalopram (LEXAPRO) 10 MG tablet Take 1 tablet (10 mg total) by mouth daily. 05/18/16   Ethelda Chick, MD  hydrochlorothiazide (HYDRODIURIL) 12.5 MG tablet Take 1 tablet (12.5 mg total) by mouth daily. 05/18/16   Ethelda Chick, MD  lisinopril (PRINIVIL,ZESTRIL) 40 MG tablet Take 1 tablet (40 mg total) by mouth daily. 05/18/16   Ethelda Chick, MD  Multiple Vitamins-Minerals (MULTIVITAMIN PO) Take 1 tablet by mouth daily.    [provider]  simvastatin (ZOCOR) 20 MG tablet Take  1 tablet (20 mg total) by mouth daily. 05/18/16   Ethelda Chick, MD    Family History Family History  Problem Relation Age of Onset  . Heart attack Mother 43  . Stroke Mother   . Hypertension Mother   . Heart attack Father 60  . Cerebral aneurysm Sister   . Hypertension Sister   . Kidney disease Sister        ESRD; HD in last year  . Hypertension Brother   . Hyperlipidemia Brother   . Rheum arthritis Sister   . Arthritis Sister   . Hypertension Brother   . Hyperlipidemia Brother   . Hyperlipidemia Brother   .  Hypertension Brother     Social History Social History  Substance Use Topics  . Smoking status: Former Smoker    Packs/day: 0.50    Years: 40.00    Types: Cigarettes    Quit date: 07/09/2011  . Smokeless tobacco: Never Used  . Alcohol use No     Allergies   Patient has no known allergies.   Review of Systems Review of Systems  Constitutional: Negative for chills and fever.  Respiratory: Negative for shortness of breath.   Cardiovascular: Negative for chest pain.  Gastrointestinal: Negative for abdominal pain, diarrhea, nausea and vomiting.  Musculoskeletal: Negative for neck pain and neck stiffness.  Skin: Negative for rash and wound.  Neurological: Negative for speech difficulty, weakness, light-headedness, numbness and headaches.       Staring spells     Physical Exam Updated Vital Signs BP 121/60   Pulse 67   Temp 98.8 F (37.1 C)   Resp 15   Ht 5\' 5"  (1.651 m)   Wt 118.4 kg (261 lb)   SpO2 97%   BMI 43.43 kg/m   Physical Exam  Constitutional: She appears well-developed and well-nourished. No distress.  HENT:  Head: Normocephalic and atraumatic.  Eyes: Conjunctivae are normal.  Neck: Neck supple.  Cardiovascular: Normal rate and regular rhythm.   No murmur heard. Pulmonary/Chest: Effort normal and breath sounds normal. No respiratory distress.  Abdominal: Soft. There is no tenderness.  Musculoskeletal: She exhibits no edema.  Neurological: She is alert. She has normal strength. No cranial nerve deficit or sensory deficit. She displays a negative Romberg sign. Coordination normal. GCS eye subscore is 4. GCS verbal subscore is 5. GCS motor subscore is 6.  Skin: Skin is warm and dry.  Psychiatric: She has a normal mood and affect.  Nursing note and vitals reviewed.    ED Treatments / Results  Labs (all labs ordered are listed, but only abnormal results are displayed) Labs Reviewed  CBC - Abnormal; Notable for the following:       Result Value    Hemoglobin 11.9 (*)    MCH 25.6 (*)    All other components within normal limits  COMPREHENSIVE METABOLIC PANEL - Abnormal; Notable for the following:    Glucose, Bld 124 (*)    BUN 23 (*)    GFR calc non Af Amer 60 (*)    All other components within normal limits  CBG MONITORING, ED - Abnormal; Notable for the following:    Glucose-Capillary 115 (*)    All other components within normal limits  I-STAT CHEM 8, ED - Abnormal; Notable for the following:    BUN 26 (*)    Glucose, Bld 122 (*)    All other components within normal limits  PROTIME-INR  APTT  DIFFERENTIAL  I-STAT TROPOININ, ED  EKG  EKG Interpretation  Date/Time:  Saturday Sep 25 2016 18:12:44 EDT Ventricular Rate:  71 PR Interval:  194 QRS Duration: 82 QT Interval:  400 QTC Calculation: 434 R Axis:   19 Text Interpretation:  Normal sinus rhythm Normal ECG Confirmed by STEINL  MD, Caryn Bee ([id number]) on 09/25/2016 7:33:26 PM       Radiology Ct Head Wo Contrast  Result Date: 09/25/2016 CLINICAL DATA:  Unresponsiveness EXAM: CT HEAD WITHOUT CONTRAST TECHNIQUE: Contiguous axial images were obtained from the base of the skull through the vertex without intravenous contrast. COMPARISON:  08/26/2016 FINDINGS: Brain: No evidence of acute infarction, hemorrhage, hydrocephalus, extra-axial collection or mass lesion/mass effect. Vascular: No hyperdense vessel or unexpected calcification. Skull: Normal. Negative for fracture or focal lesion. Sinuses/Orbits: No acute finding. Other: None. IMPRESSION: No acute abnormality is noted. Electronically Signed   By: Alcide Clever M.D.   On: 09/25/2016 20:15    Procedures Procedures (including critical care time)  Medications Ordered in ED Medications - No data to display   Initial Impression / Assessment and Plan / ED Course  I have reviewed the triage vital signs and the nursing notes.  Pertinent labs & imaging results that were available during my care of the patient were  reviewed by me and considered in my medical decision making (see chart for details).     Unclear what is causing the patient's staring spells at this time. Has had multiple of these spells in the past. Multiple MRIs of been within normal limits. She denies any headaches or vision changes. No fevers or chills. No meningismus here. Afebrile. Doubt central infectious process such as meningitis or encephalitis. She has no focal neurological deficits on my exam. Recent MRI as well as a CT head here are within normal limits. Doubt CVA. During these spells, the patient doesn't have any neurological deficits, just staring episodes that last 1-2 minutes at a time. She denies any chest pain, shortness of breath, palpitations associated. Possible absence seizure activity. She is supposed to follow up with the neurologist 3 days from today. Told her to keep this appointment. Labs otherwise here are within normal limits. Told her that she should discuss with the neurologist about having a possible EEG to evaluate for seizure activity. Gave her seizure activity warnings and told her to avoid operating any heavy machinery or vehicles until being seen by a neurologist.  Final Clinical Impressions(s) / ED Diagnoses   Final diagnoses:  Spell of altered cognition    New Prescriptions Discharge Medication List as of 09/25/2016  8:39 PM       Lindalou Hose, MD 09/25/16 2121    Cathren Laine, MD 09/25/16 2229

## 2016-09-28 ENCOUNTER — Encounter: Payer: Self-pay | Admitting: Neurology

## 2016-09-28 ENCOUNTER — Ambulatory Visit (INDEPENDENT_AMBULATORY_CARE_PROVIDER_SITE_OTHER): Payer: Medicare Other | Admitting: Neurology

## 2016-09-28 VITALS — BP 128/76 | HR 78 | Resp 18 | Ht 65.0 in | Wt 262.0 lb

## 2016-09-28 DIAGNOSIS — R400 Somnolence: Secondary | ICD-10-CM

## 2016-09-28 DIAGNOSIS — R404 Transient alteration of awareness: Secondary | ICD-10-CM

## 2016-09-28 DIAGNOSIS — R51 Headache: Secondary | ICD-10-CM | POA: Diagnosis not present

## 2016-09-28 DIAGNOSIS — R0683 Snoring: Secondary | ICD-10-CM | POA: Diagnosis not present

## 2016-09-28 DIAGNOSIS — R519 Headache, unspecified: Secondary | ICD-10-CM

## 2016-09-28 DIAGNOSIS — E669 Obesity, unspecified: Secondary | ICD-10-CM

## 2016-09-28 NOTE — Patient Instructions (Signed)
Based on your symptoms and your exam I believe you are at risk for obstructive sleep apnea or OSA, and I think we should proceed with a sleep study to determine whether you do or do not have OSA and how severe it is. If you have more than mild OSA, I want you to consider treatment with CPAP. Please remember, the risks and ramifications of moderate to severe obstructive sleep apnea or OSA are: Cardiovascular disease, including congestive heart failure, stroke, difficult to control hypertension, arrhythmias, and even type 2 diabetes has been linked to untreated OSA. Sleep apnea causes disruption of sleep and sleep deprivation in most cases, which, in turn, can cause recurrent headaches, problems with memory, mood, concentration, focus, and vigilance. Most people with untreated sleep apnea report excessive daytime sleepiness, which can affect their ability to drive. Please do not drive if you feel sleepy.   I will likely see you back after your sleep study to go over the test results and where to go from there. We will call you after your sleep study to advise about the results (most likely, you will hear from Lafonda Mosses, my nurse) and to set up an appointment at the time, as necessary.    Our sleep lab administrative assistant, Alvis Lemmings will meet with you or call you to schedule your sleep study. If you don't hear back from her by next week please feel free to call her at [phone]. This is her direct line and please leave a message with your phone number to call back if you get the voicemail box. She will call back as soon as possible.   Please do not drive for now.   We will do an EEG (brainwave test), which we will schedule. We will call you with the results. I will ask Dr. Anne Hahn to read your EEG. Please follow up with Dr. Patrecia Pace.

## 2016-09-28 NOTE — Progress Notes (Signed)
Subjective:    Patient ID: [name] [name] is a 66 y.o. female.  HPI     History:   Dear Dr. Alvy Bimler,   I saw your patient, [name] [name], upon your kind request in my neurologic clinic today for initial consultation of her sleep disorder, in particular, concern for underlying obstructive sleep apnea. The patient is accompanied by her sister today. As you know, [name] [name] is a 66 year old right-handed woman with an underlying medical history of anxiety, depression, hypertension, hyperlipidemia, history of meningitis in 2015, anemia, gait disorder, memory loss, prior long-standing history of smoking, and morbid obesity with a BMI of over 40, who reports snoring and excessive daytime somnolence as well as recent spells of decreased consciousness.  Her Epworth sleepiness score is 11 out of 24 today, fatigue score is 42 out of 63. She is divorced and lives alone. She has 1 child. She quit smoking some 5 years ago and drinks alcohol occasionally in the form of one glass of wine typically. She drinks caffeine in the form of coffee, 2 cups daily. She reports spells of inattentiveness. She has had 2 spells some 30 days ago. She had a brain MRI w/wo contrast on 08/26/16, which I reviewed: IMPRESSION: 1. No acute intracranial infarct or other process identified. 2. Partially empty sella with prominent CSF within Meckel's cave bilaterally. This appearance can be seen in the setting of idiopathic intracranial hypertension (pseudotumor cerebri the), but could also reflect a normal anatomic variant. Correlation with CSF opening pressure could be performed for confirmatory purposes as clinically indicated. 3. Otherwise normal brain MRI. She had CTH wo contrast on 09/25/16, which I reviewed: IMPRESSION: No acute abnormality is noted. She presented to the emergency room on 09/25/2016 with a staring spell. She was referred to neurology at [facility]. She previously saw Dr. Anne Hahn in this office at the time of her  meningitis, last visit in March 2016 with our nurse practitioner. She has gained weight in the past 3 years, almost 20 lb. Has been told, snoring is loud.  She has woken up with a sense of gasping. She has occasional morning headaches, has had RLS Sx.  She tosses and turns, has nocturia about 1-2 times per night. She naps occasionally. She has difficulty falling asleep and staying asleep, no sleep aid.  Her sister reports that she had spells of inattention and losing contact with reality in 2014. She had workup for this as I understand. She has no history of convulsions or trembling, shaking spells. The patient has no recollection of the event during the time which last just a few minutes at a time. She has been told by the emergency room physician not to drive. The patient denies frank depression but endorses anxiety. She is tearful multiple times during the appointment. Her sister reports that she has noticed the patient is not very outgoing, stays alone a lot. They lost one sister last year. They were a total of 3 brothers and 4 sisters.  Her Past Medical History Is Significant For: Past Medical History:  Diagnosis Date  . Anxiety   . Cataract   . Depression   . Gait disorder 03/14/2014  . HTN (hypertension)   . Hx of cardiovascular stress test    Lexiscan Myoview (1/16):  No ischemia, EF 68%, breast atten; Low Risk  . Hyperlipemia   . Memory difficulty 03/14/2014  . Meningitis due to bacteria 01-18-14  . TIA (transient ischemic attack)     Her Past Surgical History Is  Significant For: Past Surgical History:  Procedure Laterality Date  . NO PAST SURGERIES      Her Family History Is Significant For: Family History  Problem Relation Age of Onset  . Heart attack Mother 67  . Stroke Mother   . Hypertension Mother   . Heart attack Father 75  . Cerebral aneurysm Sister   . Hypertension Sister   . Kidney disease Sister        ESRD; HD in last year  . Hypertension Brother   .  Hyperlipidemia Brother   . Rheum arthritis Sister   . Arthritis Sister   . Hypertension Brother   . Hyperlipidemia Brother   . Hyperlipidemia Brother   . Hypertension Brother     Her Social History Is Significant For: Social History   Social History  . Marital status: Divorced    Spouse name: N/A  . Number of children: 1  . Years of education: college   Occupational History  . Retired [employer]   Social History Main Topics  . Smoking status: Former Smoker    Packs/day: 0.50    Years: 40.00    Types: Cigarettes    Quit date: 07/09/2011  . Smokeless tobacco: Never Used  . Alcohol use 0.0 oz/week     Comment: Rare  . Drug use: No  . Sexual activity: No   Other Topics Concern  . None   Social History Narrative   Marital status: divorced x 35 years; not dating; not interested in 2018      Children: 1 child/son (44); 2 grandchildren; [place]      Lives:  Alone in [place] in [place].      Employment: retired 2008; Administrator, arts.      Tobacco: quit smoking in 2013.  Smoked x many years       Alcohol: none; holidays only      Exercise: none; quit exercising two years ago in 2015.  Plans to join [employer]      Seatbelt: 100%; no texting. Drives locally       ADLs: drives locally; independent with ADLs.  Has cane for long distances      Advanced Directives:          Patient is single lives at home alone, has 1 child   Patient is right handed   Education level is 1 year of college   Caffeine consumption is 2 cups daily    Her Allergies Are:  No Known Allergies:   Her Current Medications Are:  Outpatient Encounter Prescriptions as of 09/28/2016  Medication Sig  . aspirin 81 MG chewable tablet Chew 81 mg by mouth daily.  . carvedilol (COREG) 3.125 MG tablet Take 1 tablet (3.125 mg total) by mouth 2 (two) times daily with a meal.  . escitalopram (LEXAPRO) 10 MG tablet Take 1 tablet (10 mg total) by mouth daily.  . hydrochlorothiazide  (HYDRODIURIL) 12.5 MG tablet Take 1 tablet (12.5 mg total) by mouth daily.  Marland Kitchen lisinopril (PRINIVIL,ZESTRIL) 40 MG tablet Take 1 tablet (40 mg total) by mouth daily.  . Multiple Vitamins-Minerals (MULTIVITAMIN PO) Take 1 tablet by mouth daily.  . simvastatin (ZOCOR) 20 MG tablet Take 1 tablet (20 mg total) by mouth daily.   No facility-administered encounter medications on file as of 09/28/2016.   :  Review of Systems:  Out of a complete 14 point review of systems, all are reviewed and negative with the exception of these symptoms as listed below:  Review of Systems  Neurological:       Patient states that she has trouble falling and staying asleep, snores, wakes up coughing, wakes up feeling tired, daytime fatigue, takes naps.   Sister states that patient has episodes of starring, confusion, and being non-verbal. Patient does not recall this happening. Reports 4 episodes this month.    Epworth Sleepiness Scale 0= would never doze 1= slight chance of dozing 2= moderate chance of dozing 3= high chance of dozing  Sitting and reading:2 Watching TV:2 Sitting inactive in a public place (ex. Theater or meeting):1 As a passenger in a car for an hour without a break:2 Lying down to rest in the afternoon:3 Sitting and talking to someone:0 Sitting quietly after lunch (no alcohol):1 In a car, while stopped in traffic:0 Total:11  Objective:  Neurologic Exam  Physical Exam Physical Examination:   Vitals:   09/28/16 1322  BP: 128/76  Pulse: 78  Resp: 18   General Examination: The patient is a very pleasant 66 y.o. female in no acute distress. She appears well-developed and well-nourished and well groomed. Tearful, anxious appearing.   HEENT: Normocephalic, atraumatic, pupils are equal, round and reactive to light and accommodation. Funduscopic exam is normal with sharp disc margins noted. Extraocular tracking is good without limitation to gaze excursion or nystagmus noted.  Normal smooth pursuit is noted. Hearing is grossly intact. Face is symmetric with normal facial animation and normal facial sensation. Speech is clear with no dysarthria noted. There is no hypophonia. There is no lip, neck/head, jaw or voice tremor. Neck is supple with full range of passive and active motion. There are no carotid bruits on auscultation. Oropharynx exam reveals: mild mouth dryness, adequate dental hygiene and moderate airway crowding, due to redundant soft palate, larger uvula, tonsils in place. Mallampati is class II. Tongue protrudes centrally and palate elevates symmetrically. Tonsils are 1+ in size. Neck size is 16 inches. She has a Mild overbite.   Chest: Clear to auscultation without wheezing, rhonchi or crackles noted.  Heart: S1+S2+0, regular and normal without murmurs, rubs or gallops noted.   Abdomen: Soft, non-tender and non-distended with normal bowel sounds appreciated on auscultation.  Extremities: There is no pitting edema in the distal lower extremities bilaterally. Pedal pulses are intact.  Skin: Warm and dry without trophic changes noted.  Musculoskeletal: exam reveals no obvious joint deformities, tenderness or joint swelling or erythema.   Neurologically:  Mental status: The patient is awake, alert and oriented in all 4 spheres. Her immediate and remote memory, attention, language skills and fund of knowledge are appropriate. There is no evidence of aphasia, agnosia, apraxia or anomia. Speech is clear with normal prosody and enunciation. Thought process is linear. Mood is constricted and affect is blunted.  Cranial nerves II - XII are as described above under HEENT exam. In addition: shoulder shrug is normal with equal shoulder height noted. Motor exam: Normal bulk, strength and tone is noted. There is no drift, tremor or rebound. Romberg is negative. Reflexes are 1+ in the upper extremities, trace in the lower extremities. Fine motor skills and coordination:  intact with normal finger taps, normal hand movements, normal rapid alternating patting, normal foot taps and normal foot agility.  Cerebellar testing: No dysmetria or intention tremor on finger to nose testing. Heel to shin is unremarkable bilaterally, somewhat limited. There is no truncal or gait ataxia.  Sensory exam: intact to light touch, pinprick, vibration, temperature sense in the upper and lower extremities.  Gait,  station and balance: She stands easily. No veering to one side is noted. No leaning to one side is noted. Posture is age-appropriate and stance is narrow based. Gait shows normal stride length and normal pace. No problems turning are noted. Tandem walk is slightly slow but doable.                Assessment and Plan:  In summary, [name] [name] is a very pleasant 66 y.o.-year old female with an underlying medical history of anxiety, depression, hypertension, hyperlipidemia, history of meningitis in 2015, anemia, gait disorder, memory loss, prior long-standing history of smoking, and morbid obesity with a BMI of over 40, who presents for initial evaluation of her sleep disorder. She has also been reported to have spells of inattention, decreased consciousness, without overt convulsions. Her history and physical exam are concerning for underlying obstructive sleep apnea. I explained to the patient and her sister that I'm not sure as to her spells of decreased attentiveness. She had no one-sided weakness, numbness, slurring of speech or droopy face at the time, doubtful that these were TIA type events. Nevertheless, we will proceed with EEG testing and I would like for her to follow-up with Dr. Anne Hahn after that. He is familiar with her prior history and I will request that he read her EEG. She had a recent brain MRI as well as head CT, both nonrevealing. Physical exam a han particularly neurological exam are nonfocal which is reassuring. The patient is advised not to drive as she had a total  of 4 spells of decreased alertness. Not sure if these are seizures but not impossible I explained to them. We will call them with the EEG results. We will furthermore proceed with sleep study testing.   I explained the sleep test procedure to the patient and also outlined possible treatment options of OSA, including the use of CPAP.  I answered all their questions today and the patient and her sister were in agreement. I would like to see her back after the sleep study is completed and encouraged her to call with any interim questions, concerns, problems or updates.   Thank you very much for allowing me to participate in the care of this nice patient. If I can be of any further assistance to you please do not hesitate to call me at [phone].  Sincerely,   Huston Foley, MD, PhD

## 2016-09-30 ENCOUNTER — Ambulatory Visit (INDEPENDENT_AMBULATORY_CARE_PROVIDER_SITE_OTHER): Payer: Medicare Other | Admitting: Diagnostic Neuroimaging

## 2016-09-30 DIAGNOSIS — E669 Obesity, unspecified: Secondary | ICD-10-CM

## 2016-09-30 DIAGNOSIS — R519 Headache, unspecified: Secondary | ICD-10-CM

## 2016-09-30 DIAGNOSIS — R51 Headache: Secondary | ICD-10-CM

## 2016-09-30 DIAGNOSIS — R410 Disorientation, unspecified: Secondary | ICD-10-CM

## 2016-09-30 DIAGNOSIS — R0683 Snoring: Secondary | ICD-10-CM

## 2016-09-30 DIAGNOSIS — R404 Transient alteration of awareness: Secondary | ICD-10-CM

## 2016-09-30 DIAGNOSIS — R400 Somnolence: Secondary | ICD-10-CM

## 2016-10-05 ENCOUNTER — Ambulatory Visit (INDEPENDENT_AMBULATORY_CARE_PROVIDER_SITE_OTHER): Payer: Medicare Other | Admitting: Neurology

## 2016-10-05 DIAGNOSIS — G4733 Obstructive sleep apnea (adult) (pediatric): Secondary | ICD-10-CM | POA: Diagnosis not present

## 2016-10-05 DIAGNOSIS — G4761 Periodic limb movement disorder: Secondary | ICD-10-CM

## 2016-10-05 DIAGNOSIS — G4720 Circadian rhythm sleep disorder, unspecified type: Secondary | ICD-10-CM

## 2016-10-08 ENCOUNTER — Telehealth: Payer: Self-pay | Admitting: Family Medicine

## 2016-10-08 NOTE — Procedures (Signed)
PATIENT'S NAME:  [name] [name], [name] [name] DOB:      1950/08/21      MR#:    [id number]     DATE OF RECORDING: 10/05/2016 REFERRING M.D.: Dr. Alvy Bimler, PCP: Nilda Simmer, MD Study Performed:   Baseline Polysomnogram HISTORY: 66 year old woman with a history of anxiety, depression, hypertension, hyperlipidemia, history of meningitis in 2015, anemia, gait disorder, memory loss, prior long-standing history of smoking, and morbid obesity with a BMI of over 40, who reports snoring and excessive daytime somnolence as well as recent spells of decreased consciousness. The patient endorsed the Epworth Sleepiness Scale at 11/24 points. The patient's weight 262 pounds with a height of 65 (inches), resulting in a BMI of 43.7 kg/m2. The patient's neck circumference measured 16 inches.  CURRENT MEDICATIONS: Aspirin, Carvedilol, Escitalopram, Hydrochlorothiazide, Lisinopril, Multi-Vitamin and Simvastatin.   PROCEDURE:  This is a multichannel digital polysomnogram utilizing the Somnostar 11.2 system.  Electrodes and sensors were applied and monitored per AASM Specifications.   EEG, EOG, Chin and Limb EMG, were sampled at 200 Hz.  ECG, Snore and Nasal Pressure, Thermal Airflow, Respiratory Effort, CPAP Flow and Pressure, Oximetry was sampled at 50 Hz. Digital video and audio were recorded.      BASELINE STUDY  Lights Out was at 21:22 and Lights On at 05:01.  Total recording time (TRT) was 459.5 minutes, with a total sleep time (TST) of  235 minutes.   The patient's sleep latency was 261 minutes.  REM latency was 302.5 minutes.  The sleep efficiency was 51.1 %.     SLEEP ARCHITECTURE: WASO (Wake after sleep onset) was 146 minutes with moderate to severe sleep fragmentation noted. There were 15 minutes in Stage N1, 174 minutes Stage N2, 0 minutes Stage N3 and 46 minutes in Stage REM.  The percentage of Stage N1 was 6.4%, Stage N2 was 74.%, which is increased, Stage N3 was absent and Stage R (REM sleep) was 19.6%, which is normal.   The arousals were noted as: 20 were spontaneous, 15 were associated with PLMs, 189 were associated with respiratory events.    Audio and video analysis did not show any abnormal or unusual movements, behaviors, phonations or vocalizations.  The patient took one bathroom break. Moderate to loud snoring was noted. The EKG was in keeping with normal sinus rhythm (NSR).  RESPIRATORY ANALYSIS:  There were a total of 189 respiratory events:  93 obstructive apneas, 0 central apneas and 0 mixed apneas with a total of 93 apneas and an apnea index (AI) of 23.7 /hour. There were 96 hypopneas with a hypopnea index of 24.5 /hour. The patient also had 0 respiratory event related arousals (RERAs).      The total APNEA/HYPOPNEA INDEX (AHI) was 48.3/hour and the total RESPIRATORY DISTURBANCE INDEX was 48.3 /hour.  85 events occurred in REM sleep and 166 events in NREM. The REM AHI was 110.9 /hour, versus a non-REM AHI of 33.. The patient spent 0 minutes of total sleep time in the supine position and 235 minutes in non-supine.. The supine AHI was n/a.  OXYGEN SATURATION & C02:  The Wake baseline 02 saturation was 95%, with the lowest being 58%. Time spent below 89% saturation equaled 47 minutes.  PERIODIC LIMB MOVEMENTS:  The patient had a total of 92 Periodic Limb Movements.  The Periodic Limb Movement (PLM) index was 23.5 and the PLM Arousal index was 3.8/hour.    Post-study, the patient indicated that sleep was worse than usual.   IMPRESSION:  1. Obstructive  Sleep Apnea (OSA) 2. Periodic Limb Movement Disorder (PLMD) 3. Dysfunctions associated with sleep stages or arousal from sleep  RECOMMENDATIONS:  1. This study demonstrates severe obstructive sleep apnea, with a total AHI of 48.3/hour, REM AHI of 110.9/hour, supine AHI of n/a and O2 nadir of 58%. Treatment with positive airway pressure in the form of CPAP is recommended. This will require a full night titration study to optimize therapy. Other treatment  options may include avoidance of supine sleep position along with weight loss, upper airway or jaw surgery in selected patients or the use of an oral appliance in certain patients. ENT evaluation and/or consultation with a maxillofacial surgeon or dentist may be feasible in some instances.    2. Please note that untreated obstructive sleep apnea carries additional perioperative morbidity. Patients with significant obstructive sleep apnea should receive perioperative PAP therapy and the surgeons and particularly the anesthesiologist should be informed of the diagnosis and the severity of the sleep disordered breathing. 3. Mild PLMs (periodic limb movements of sleep) were noted during this study with minimal arousals; clinical correlation is recommended. Medication effect from the antidepressant medication should be considered.  4. This study shows sleep fragmentation and abnormal sleep stage percentages; these are nonspecific findings and per se do not signify an intrinsic sleep disorder or a cause for the patient's sleep-related symptoms. Causes include (but are not limited to) the first night effect of the sleep study, circadian rhythm disturbances, medication effect or an underlying mood disorder or medical problem.  5. The patient should be cautioned not to drive, work at heights, or operate dangerous or heavy equipment when tired or sleepy. Review and reiteration of good sleep hygiene measures should be pursued with any patient.  I certify that I have reviewed the entire raw data recording prior to the issuance of this report in accordance with the Standards of Accreditation of the American Academy of Sleep Medicine (AASM)  Huston Foley, MD, PhD Diplomat, American Board of Psychiatry and Neurology (Neurology and Sleep Medicine)

## 2016-10-08 NOTE — Telephone Encounter (Signed)
See sleep study 

## 2016-10-08 NOTE — Progress Notes (Signed)
Patient referred by Dr. Alvy Bimler, seen by me on 09/28/16, diagnostic PSG on 10/05/16.    Please call and notify the patient that the recent sleep study did confirm the diagnosis of severe obstructive sleep apnea and that I recommend treatment for this in the form of CPAP. This will require a repeat sleep study for proper titration and mask fitting. Please explain to patient and arrange for a CPAP titration study. I have placed an order in the chart. Thanks, and please route to [facility] for scheduling next sleep study.  Huston Foley, MD, PhD [facility])

## 2016-10-08 NOTE — Telephone Encounter (Signed)
Pt states that she would like you to give her a call because the neurology has not call her about the test she had done with them.  Pt contact number [phone]

## 2016-10-08 NOTE — Addendum Note (Signed)
Addended by: Huston Foley on: 10/08/2016 11:01 AM   Modules accepted: Orders

## 2016-10-08 NOTE — Procedures (Signed)
   [facility]  EEG (ELECTROENCEPHALOGRAM) REPORT   STUDY DATE: 09/30/16 PATIENT NAME: [name] [name] DOB: [birth year] MRN: [id number]  ORDERING CLINICIAN: Huston Foley, MD PhD   TECHNOLOGIST: Elvis Coil TECHNIQUE: Electroencephalogram was recorded utilizing standard 10-20 system of lead placement and reformatted into average and bipolar montages.  RECORDING TIME: 23 minutes  ACTIVATION: hyperventilation and photic stimulation  CLINICAL INFORMATION: 66 year old female with abnormal spells.  FINDINGS: Background rhythms of 9-10 hertz and 40-50 microvolts. No focal, lateralizing, epileptiform activity or seizures are seen. Patient recorded in the awake and drowsy state. EKG channel shows 50-60 beats per minute.   IMPRESSION:  Normal EEG in the awake and drowsy states.    INTERPRETING PHYSICIAN:  Suanne Marker, MD Certified in Neurology, Neurophysiology and Neuroimaging  [facility] [address] [place], [place] [address] [phone]

## 2016-10-11 ENCOUNTER — Telehealth: Payer: Self-pay | Admitting: Neurology

## 2016-10-11 ENCOUNTER — Telehealth: Payer: Self-pay

## 2016-10-11 NOTE — Telephone Encounter (Signed)
-----   Message from Huston Foley, MD sent at 10/11/2016  7:44 AM EDT ----- Please call and advise the patient that the EEG or brain wave test we performed was reported as normal in the awake and drowsy states. We checked for abnormal electrical discharges in the brain waves and the report suggested normal findings. No further action is required on this test at this time. Please remind patient to keep any upcoming appointments or tests and to call us with any interim questions, concerns, problems or updates. Thanks,  Huston Foley, MD, PhD

## 2016-10-11 NOTE — Telephone Encounter (Signed)
[name], please call her back: I would suggest that she can resume driving with the following warnings:   She should be cautioned not to drive, work at heights, or operate dangerous or heavy equipment when tired or sleepy.   Also: Please remind patient to try to maintain good sleep hygiene, which means: Keep a regular sleep and wake schedule, try not to exercise or have a meal within 2 hours of your bedtime, try to keep your bedroom conducive for sleep, that is, cool and dark, without light distractors such as an illuminated alarm clock, and refrain from watching TV right before sleep or in the middle of the night and do not keep the TV or radio on during the night. Also, try not to use or play on electronic devices at bedtime, such as your cell phone, tablet PC or laptop. If you like to read at bedtime on an electronic device, try to dim the background light as much as possible. Do not eat in the middle of the night.   I have a feeling she will benefit from using CPAP.   We will call her after cpap study.

## 2016-10-11 NOTE — Telephone Encounter (Signed)
See other telephone note from today 

## 2016-10-11 NOTE — Telephone Encounter (Signed)
Patient calling for EEG results.

## 2016-10-11 NOTE — Telephone Encounter (Signed)
I called pt to discuss EEG results. No answer at this time. Left message for pt to call back.

## 2016-10-11 NOTE — Telephone Encounter (Signed)
Pt called back and spoke to her and advised her that her EEG results were normal. I advised pt that Dr. Frances Furbish reviewed their sleep study results and found that pt has severe sleep apnea. Dr. Frances Furbish recommends that pt return for a repeat sleep study in order to properly titrate the cpap and ensure a good mask fit. Pt is agreeable to returning for a titration study. I advised pt that our sleep lab will file with pt's insurance and call pt to schedule the sleep study when we hear back from the pt's insurance regarding coverage of this sleep study. Pt verbalized understanding of results. Pt had concerns regarding about being able to drive since her EEG results were normal. Pt tearful on the phone.

## 2016-10-11 NOTE — Telephone Encounter (Addendum)
I called pt, advised her that Dr Frances Furbish has given permission to drive as long as the pt is not sleepy. She was advised not to work at heights or operate heavy machinery when sleepy. I reviewed sleep hygiene recommendations with the pt. Pt knows that our sleep lab will contact her with follow up for the cpap titration study. Pt asked if we could mail her a copy of her sleep study. I confirmed that the address we have on file is correct and advised pt that I will mail her a copy of her sleep study results. Pt verbalized understanding. Pt had no questions at this time but was encouraged to call back if questions arise.

## 2016-10-11 NOTE — Progress Notes (Signed)
Please call and advise the patient that the EEG or brain wave test we performed was reported as normal in the awake and drowsy states. We checked for abnormal electrical discharges in the brain waves and the report suggested normal findings. No further action is required on this test at this time. Please remind patient to keep any upcoming appointments or tests and to call us with any interim questions, concerns, problems or updates. Thanks,  Tranika Scholler, MD, PhD  

## 2016-10-12 NOTE — Telephone Encounter (Signed)
I called pt, had an extended conversation with her again. I reiterated that her EEG was normal and that Dr. Frances Furbish said that pt may drive as long as she is not tired or sleepy. I advised pt again that Dr. Frances Furbish reviewed their sleep study results and found that pt has severe osa, which does require treatment in the form of a cpap. Dr. Frances Furbish recommends that pt return for a repeat sleep study in order to properly titrate the cpap and ensure a good mask fit. Pt is agreeable to returning for a titration study. I advised pt that our sleep lab will file with pt's insurance and call pt to schedule the sleep study when we hear back from the pt's insurance regarding coverage of this sleep study. Pt is very concerned about the cost of the sleep study and I did tell her that our sleep lab will call her with the cost when insurance is filed and we hear back. Dr. Frances Furbish does feel that cpap will help pt. Dr. Frances Furbish also wanted pt to follow up with Dr. Anne Hahn, and I offered her an appt with Dr. Anne Hahn for tomorrow at 9:00am. Pt says that she will check with her sister and let us know if this will work. I advised her that I will only hold Dr. Anne Hahn' 9am appt for 1 hour and pt needs to call me back. Pt verbalized understanding of results and will call me back regarding the 9am appt with Dr. Anne Hahn.

## 2016-10-12 NOTE — Telephone Encounter (Signed)
Pt calling wanting a call from Dr Frances Furbish, she said RN called her to inform results are in from EMG and sleep study but she wants to hear directly from Dr Frances Furbish with better explanation of results

## 2016-10-12 NOTE — Telephone Encounter (Signed)
Please call patient back: As explained during our visit I would like for her to follow-up with Dr. Anne Hahn or [facility] after the EEG. I will see her back after her CPAP sleep studies completed. Please offer her a follow-up appointment with Dr. Anne Hahn for the next available if he has an opening soon, otherwise with [facility].

## 2016-10-12 NOTE — Telephone Encounter (Signed)
Pt accepted the appt tomorrow with Dr. Anne Hahn at 9:00am and she has been placed on the schedule, per the phone staff.

## 2016-10-13 ENCOUNTER — Encounter: Payer: Self-pay | Admitting: Neurology

## 2016-10-13 ENCOUNTER — Ambulatory Visit (INDEPENDENT_AMBULATORY_CARE_PROVIDER_SITE_OTHER): Payer: Medicare Other | Admitting: Neurology

## 2016-10-13 VITALS — BP 117/72 | HR 74 | Ht 65.0 in | Wt 263.8 lb

## 2016-10-13 DIAGNOSIS — R404 Transient alteration of awareness: Secondary | ICD-10-CM

## 2016-10-13 DIAGNOSIS — F5104 Psychophysiologic insomnia: Secondary | ICD-10-CM

## 2016-10-13 HISTORY — DX: Psychophysiologic insomnia: F51.04

## 2016-10-13 MED ORDER — TRAZODONE HCL 50 MG PO TABS: 100.0000 mg | ORAL_TABLET | Freq: Every day | ORAL | 1 refills | Status: DC

## 2016-10-13 NOTE — Progress Notes (Signed)
Reason for visit: Episodes of loss of awareness  [name] [name] is a 66 y.o. female  History of present illness:  [name] [name] is a 66 year old right-handed female with a history of meningitis, the patient was last seen by myself through this office in 2015. The patient had an excellent recovery from the meningitis at that time. The patient returns for a different reason today. The patient has been having episodes that have been witnessed associated with staring events, and unresponsiveness lasting about 1 minute. If the patient is standing, she will not fall down. She does not have any jerking or twitching, she seems to have some mild confusion coming out of the episode for several seconds, and then rapidly regains normal consciousness. The patient herself does not recall any of these events. She has had 4 such events since 08/25/2016, the last episode noted was on 09/25/2016. The patient is undergone MRI evaluation of the brain that shows an empty sella syndrome, otherwise unremarkable. The patient has had an EEG study that was normal. The patient is followed by Dr. Frances Furbish for possible sleep apnea. The patient reports a chronic history of insomnia. The patient gets 3 or 4 hours sleep at night. The patient is also being treated for anxiety and depression. The episodes of staring off predated the meningitis, the episodes have been going on for several years.  Past Medical History:  Diagnosis Date  . Anxiety   . Cataract   . Depression   . Gait disorder 03/14/2014  . HTN (hypertension)   . Hx of cardiovascular stress test    Lexiscan Myoview (1/16):  No ischemia, EF 68%, breast atten; Low Risk  . Hyperlipemia   . Memory difficulty 03/14/2014  . Meningitis due to bacteria 01-18-14  . TIA (transient ischemic attack)     Past Surgical History:  Procedure Laterality Date  . NO PAST SURGERIES      Family History  Problem Relation Age of Onset  . Heart attack Mother 56  . Stroke Mother     . Hypertension Mother   . Heart attack Father 20  . Cerebral aneurysm Sister   . Hypertension Sister   . Kidney disease Sister        ESRD; HD in last year  . Hypertension Brother   . Hyperlipidemia Brother   . Rheum arthritis Sister   . Arthritis Sister   . Hypertension Brother   . Hyperlipidemia Brother   . Hyperlipidemia Brother   . Hypertension Brother     Social history:  reports that she quit smoking about 5 years ago. Her smoking use included Cigarettes. She has a 20.00 pack-year smoking history. She has never used smokeless tobacco. She reports that she drinks alcohol. She reports that she does not use drugs.  Medications:  Prior to Admission medications   Medication Sig Start Date End Date Taking? Authorizing Provider  aspirin 81 MG chewable tablet Chew 81 mg by mouth daily.   Yes [provider]  carvedilol (COREG) 3.125 MG tablet Take 1 tablet (3.125 mg total) by mouth 2 (two) times daily with a meal. 05/18/16  Yes Ethelda Chick, MD  escitalopram (LEXAPRO) 10 MG tablet Take 1 tablet (10 mg total) by mouth daily. 05/18/16  Yes Ethelda Chick, MD  hydrochlorothiazide (HYDRODIURIL) 12.5 MG tablet Take 1 tablet (12.5 mg total) by mouth daily. 05/18/16  Yes Ethelda Chick, MD  lisinopril (PRINIVIL,ZESTRIL) 40 MG tablet Take 1 tablet (40 mg total) by mouth  daily. 05/18/16  Yes Ethelda Chick, MD  Multiple Vitamins-Minerals (MULTIVITAMIN PO) Take 1 tablet by mouth daily.   Yes [provider]  simvastatin (ZOCOR) 20 MG tablet Take 1 tablet (20 mg total) by mouth daily. 05/18/16  Yes Ethelda Chick, MD     No Known Allergies  ROS:  Out of a complete 14 system review of symptoms, the patient complains only of the following symptoms, and all other reviewed systems are negative.  Insomnia, sleep apnea, frequent waking, daytime sleepiness Agitation  Blood pressure 117/72, pulse 74, height 5\' 5"  (1.651 m), weight 263 lb 12.8 oz (119.7 kg).  Physical  Exam  General: The patient is alert and cooperative at the time of the examination. The patient is markedly obese.  Eyes: Pupils are equal, round, and reactive to light. Discs are flat bilaterally.  Neck: The neck is supple, no carotid bruits are noted.  Respiratory: The respiratory examination is clear.  Cardiovascular: The cardiovascular examination reveals a regular rate and rhythm, no obvious murmurs or rubs are noted.  Skin: Extremities are without significant edema.  Neurologic Exam  Mental status: The patient is alert and oriented x 3 at the time of the examination. The patient has apparent normal recent and remote memory, with an apparently normal attention span and concentration ability.  Cranial nerves: Facial symmetry is present. There is good sensation of the face to pinprick and soft touch bilaterally. The strength of the facial muscles and the muscles to head turning and shoulder shrug are normal bilaterally. Speech is well enunciated, no aphasia or dysarthria is noted. Extraocular movements are full. Visual fields are full. The tongue is midline, and the patient has symmetric elevation of the soft palate. No obvious hearing deficits are noted.  Motor: The motor testing reveals 5 over 5 strength of all 4 extremities. Good symmetric motor tone is noted throughout.  Sensory: Sensory testing is intact to pinprick, soft touch, vibration sensation, and position sense on all 4 extremities. No evidence of extinction is noted.  Coordination: Cerebellar testing reveals good finger-nose-finger and heel-to-shin bilaterally.  Gait and station: Gait is normal. Tandem gait is normal. Romberg is negative. No drift is seen.  Reflexes: Deep tendon reflexes are symmetric and normal bilaterally. Toes are downgoing bilaterally.   Assessment/Plan:  1. Episodes of transient loss of awareness  2. Sleep apnea  3. Chronic insomnia  The patient is having significant issues with insomnia.  The episodes of staring off could represent seizures even though the EEG study was unremarkable. Sleep deprivation may be a significant activator for these events. The patient will be placed on trazodone, the dose will be adjusted according to her needs. She will stop the Lexapro. The patient will follow-up in 3 months. She will contact me for any dose adjustments. If episodes continue and the patient is sleeping better at night, we will add Keppra to the drug regimen as and empiric trial for treatment of seizures. The patient is not to operate a motor vehicle until further notice.  Marlan Palau MD 10/13/2016 9:13 AM  [facility] [address] [place], [place] [address]  Phone [phone] Fax [phone]

## 2016-10-13 NOTE — Patient Instructions (Signed)
   We will discontinue the lexapro and start Trazodone 50 mg at night, if not effective for sleep after 1 week, begin 100 mg at night.

## 2016-10-21 ENCOUNTER — Telehealth: Payer: Self-pay | Admitting: Neurology

## 2016-10-21 MED ORDER — MIRTAZAPINE 15 MG PO TABS: 30.0000 mg | ORAL_TABLET | Freq: Every day | ORAL | 1 refills | Status: DC

## 2016-10-21 NOTE — Addendum Note (Signed)
Addended by: York Spaniel on: 10/21/2016 04:45 PM   Modules accepted: Orders

## 2016-10-21 NOTE — Telephone Encounter (Signed)
I called patient. The trazodone is causing a hangover feeling the next morning, she is not tolerating the drug well at the 100 mg dose.  We will stop the trazodone and try low-dose mirtazapine for sleep.

## 2016-10-21 NOTE — Telephone Encounter (Signed)
Concerning sleeping pills prescribed for her(traZODone (DESYREL) 50 MG tablet), they are not working. Pt asking for call back because her sleep study is coming up soon

## 2016-10-26 ENCOUNTER — Ambulatory Visit (INDEPENDENT_AMBULATORY_CARE_PROVIDER_SITE_OTHER): Payer: Medicare Other | Admitting: Neurology

## 2016-10-26 ENCOUNTER — Telehealth: Payer: Self-pay | Admitting: Neurology

## 2016-10-26 DIAGNOSIS — G4733 Obstructive sleep apnea (adult) (pediatric): Secondary | ICD-10-CM | POA: Diagnosis not present

## 2016-10-26 DIAGNOSIS — G4761 Periodic limb movement disorder: Secondary | ICD-10-CM

## 2016-10-26 DIAGNOSIS — G4720 Circadian rhythm sleep disorder, unspecified type: Secondary | ICD-10-CM

## 2016-10-26 MED ORDER — SUVOREXANT 10 MG PO TABS: 10.0000 mg | ORAL_TABLET | Freq: Every evening | ORAL | 1 refills | Status: DC | PRN

## 2016-10-26 NOTE — Telephone Encounter (Signed)
Faxed printed/signed rx Belsomra to pt pharmacy. Fax: [phone]. Received confirmation.

## 2016-10-26 NOTE — Telephone Encounter (Signed)
Called and spoke with patient. She states she is still depressed, irritable, dragging on mirtazapine. She wants to stop mirtazapine. Wants to know how she can stop medication.   She has second sleep study tonight at 930pm. Wants to know if she should take this tonight or night. She was only planning on taking 1 tablet tonight if so.  Pt verbalized she feels worse than she did when she was taking no medication. She often does not remember falling asleep while on this medication. She took 2 tablets last night at 10pm. Still awake at 2am.  Pt verbalized she is frustrated. Wondering if she should still have sleep study since she is not able to sleep. Advised I will forward questions and concerns to Dr Anne Hahn. We will call back to advise

## 2016-10-26 NOTE — Telephone Encounter (Signed)
Pt asking for a call back to discuss mirtazapine (REMERON) 15 MG tablet

## 2016-10-26 NOTE — Addendum Note (Signed)
Addended by: York Spaniel on: 10/26/2016 02:19 PM   Modules accepted: Orders

## 2016-10-26 NOTE — Telephone Encounter (Signed)
I called patient. She could not tolerate trazodone or mirtazapine at night for sleep. We will try Belsomra to see if this helps at the 10 mg dose.

## 2016-11-08 ENCOUNTER — Telehealth: Payer: Self-pay | Admitting: Neurology

## 2016-11-08 NOTE — Telephone Encounter (Signed)
Patient called office in reference to requesting Sleep study results.  Please call

## 2016-11-09 ENCOUNTER — Telehealth: Payer: Self-pay

## 2016-11-09 NOTE — Addendum Note (Signed)
Addended by: Huston Foley on: 11/09/2016 07:49 AM   Modules accepted: Orders

## 2016-11-09 NOTE — Procedures (Signed)
PATIENT'S NAME:  [name] [name], [name] [name] DOB:      06-20-1950      MR#:    [id number]     DATE OF RECORDING: 10/26/2016 REFERRING M.D.:  Nilda Simmer, MD Study Performed:   CPAP  Titration HISTORY:  66 year old right-handed woman with an underlying medical history of anxiety, depression, hypertension, hyperlipidemia, history of meningitis in 2015, anemia, gait disorder, memory loss, prior long-standing history of smoking, and morbid obesity, who returns for a CPAP titration. Her baseline sleep study from 10/05/16 demonstrated severe obstructive sleep apnea, with a total AHI of 48.3/hour, REM AHI of 110.9/hour, supine AHI of n/a and O2 nadir of 58%. The patient's BMI is 43.7 kg/m2. The patient's neck circumference measured 16 inches.  CURRENT MEDICATIONS: Alprazolam, Aspirin, Femring and Levothroid  PROCEDURE:  This is a multichannel digital polysomnogram utilizing the SomnoStar 11.2 system.  Electrodes and sensors were applied and monitored per AASM Specifications.   EEG, EOG, Chin and Limb EMG, were sampled at 200 Hz.  ECG, Snore and Nasal Pressure, Thermal Airflow, Respiratory Effort, CPAP Flow and Pressure, Oximetry was sampled at 50 Hz. Digital video and audio were recorded.      The patient was fitted with large P10 nasal pillows. CPAP was initiated at 5 cmH20 with heated humidity per AASM standards and pressure was advanced to 14 cmH20 because of hypopneas, apneas and desaturations.  At a PAP pressure of 14 cmH20, there was a reduction of the AHI to 0 with supine NREM sleep achieved and O2 nadir of 93%.     Lights Out was at 22:57 and Lights On at 05:21. Total recording time (TRT) was 383.5 minutes, with a total sleep time (TST) of 330.5 minutes. The patient's sleep latency was 9.5 minutes. REM latency was 59 minutes.  The sleep efficiency was 86.2 %.    SLEEP ARCHITECTURE: WASO (Wake after sleep onset) was 49.5 minutes with moderate sleep fragmentation noted.  There were 15 minutes in Stage N1, 243.5  minutes Stage N2, 8.5 minutes Stage N3 and 63.5 minutes in Stage REM.  The percentage of Stage N1 was 4.5%, Stage N2 was 73.7%, which is increased, Stage N3 was 2.6% and Stage R (REM sleep) was 19.2%, which is normal. The arousals were noted as: 40 were spontaneous, 84 were associated with PLMs, 13 were associated with respiratory events. Audio and video analysis did not show any abnormal or unusual movements, behaviors, phonations or vocalizations. The patient took 1 bathroom break. The EKG was in keeping with normal sinus rhythm (NSR).  RESPIRATORY ANALYSIS:  There was a total of 12 respiratory events: 0 obstructive apneas, 0 central apneas and 0 mixed apneas with a total of 0 apneas and an apnea index (AI) of 0 /hour. There were 12 hypopneas with a hypopnea index of 2.2/hour. The patient also had 0 respiratory event related arousals (RERAs).     The total APNEA/HYPOPNEA INDEX  (AHI) was 2.2 /hour and the total RESPIRATORY DISTURBANCE INDEX was 2.2 .hour  3 events occurred in REM sleep and 9 events in NREM. The REM AHI was 2.8 /hour versus a non-REM AHI of 2. /hour.  The patient spent 41 minutes of total sleep time in the supine position and 290 minutes in non-supine. The supine AHI was 5.9, versus a non-supine AHI of 1.7.  OXYGEN SATURATION & C02:  The baseline 02 saturation was 93%, with the lowest being 81%. Time spent below 89% saturation equaled 4 minutes.  PERIODIC LIMB MOVEMENTS: The patient had a  total of 213 Periodic Limb Movements. The Periodic Limb Movement (PLM) index was 38.7 and the PLM Arousal index was 15.2 /hour. Post-study, the patient indicated that sleep was better than usual.   IMPRESSION: 1. Obstructive Sleep Apnea (OSA) 2. Periodic Limb Movement Disorder (PLMD) 3. Dysfunctions associated with sleep stages or arousal from sleep   RECOMMENDATIONS: 1. This study demonstrates resolution of the patient's obstructive sleep apnea with CPAP therapy. I will, therefore, start the  patient on home CPAP treatment at a pressure of 14 cm via large nasal pillows with heated humidity. The patient should be reminded to be fully compliant with PAP therapy to improve sleep related symptoms and decrease long term cardiovascular risks. The patient should be reminded, that it may take up to 3 months to get fully used to using PAP with all planned sleep. The earlier full compliance is achieved, the better long term compliance tends to be. Please note that untreated obstructive sleep apnea carries additional perioperative morbidity. Patients with significant obstructive sleep apnea should receive perioperative PAP therapy and the surgeons and particularly the anesthesiologist should be informed of the diagnosis and the severity of the sleep disordered breathing. 2. Mild PLMs (periodic limb movements of sleep) were noted during this study with minimal arousals; clinical correlation is recommended. Medication effect from the antidepressant medication should be considered.  3. This study shows sleep fragmentation and abnormal sleep stage percentages; these are nonspecific findings and per se do not signify an intrinsic sleep disorder or a cause for the patient's sleep-related symptoms. Causes include (but are not limited to) the first night effect of the sleep study, circadian rhythm disturbances, medication effect or an underlying mood disorder or medical problem.  4. The patient should be cautioned not to drive, work at heights, or operate dangerous or heavy equipment when tired or sleepy. Review and reiteration of good sleep hygiene measures should be pursued with any patient.  5. The patient will be seen in follow-up by Dr. Frances Furbish at [facility] for discussion of the test results and further management strategies. The referring provider will be notified of the test results.   I certify that I have reviewed the entire raw data recording prior to the issuance of this report in accordance with the Standards of  Accreditation of the American Academy of Sleep Medicine (AASM)   Huston Foley, MD, PhD Diplomat, American Board of Psychiatry and Neurology (Neurology and Sleep Medicine)

## 2016-11-09 NOTE — Telephone Encounter (Signed)
I called pt. I advised pt that Dr. Frances Furbish reviewed their sleep study results and found that pt did very well with the cpap during her latest study. Dr. Frances Furbish recommends that pt start a cpap at home. I reviewed PAP compliance expectations with the pt. Pt is agreeable to starting a CPAP. I advised pt that an order will be sent to a DME, [facility], per pt request, and [facility] will call the pt within about one week after they file with the pt's insurance. [facility] will show the pt how to use the machine, fit for masks, and troubleshoot the CPAP if needed. A follow up appt was made for insurance purposes with Dr. Frances Furbish on September 19th, 2018 at 11:30am. Pt verbalized understanding to arrive 15 minutes early and bring their CPAP. A letter with all of this information in it will be mailed to the pt as a reminder. I verified with the pt that the address we have on file is correct. Pt verbalized understanding of results. Pt had no questions at this time but was encouraged to call back if questions arise.

## 2016-11-09 NOTE — Telephone Encounter (Signed)
-----   Message from Huston Foley, MD sent at 11/09/2016  7:49 AM EDT ----- Patient referred by Dr. Alvy Bimler, seen by me on 09/28/16, diagnostic PSG on 10/05/16, CPAP study on 10/26/16.  Please call and inform patient that I have entered an order for treatment with positive airway pressure (PAP) treatment of obstructive sleep apnea (OSA). She did well during the latest sleep study with CPAP. We will, therefore, arrange for a machine for home use through a DME (durable medical equipment) company of Her choice; and I will see the patient back in follow-up in about 10 weeks, can schedule with one of our NPs too for timing. Please also explain to the patient that I will be looking out for compliance data, which can be downloaded from the machine (stored on an SD card, that is inserted in the machine) or via remote access through a modem, that is built into the machine. At the time of the followup appointment we will discuss sleep study results and how it is going with PAP treatment at home. Please advise patient to bring Her machine at the time of the first FU visit, even though this is cumbersome. Bringing the machine for every visit after that will likely not be needed, but often helps for the first visit to troubleshoot if needed. Please re-enforce the importance of compliance with treatment and the need for [place] to monitor compliance data - often an insurance requirement and actually good feedback for the patient as far as how they are doing.  Also remind patient, that any interim PAP machine or mask issues should be first addressed with the DME company, as they can often help better with technical and mask fit issues. Please ask if patient has a preference regarding DME company.  Please also make sure, the patient has a follow-up appointment with me or MM or CM in about 10 weeks from the setup date, thanks.  Once you have spoken to the patient - and faxed/routed report to PCP and referring MD (if other than PCP), you  can close this encounter, thanks,   Huston Foley, MD, PhD [facility] ([facility])

## 2016-11-09 NOTE — Progress Notes (Signed)
Patient referred by Dr. Alvy Bimler, seen by me on 09/28/16, diagnostic PSG on 10/05/16, CPAP study on 10/26/16.  Please call and inform patient that I have entered an order for treatment with positive airway pressure (PAP) treatment of obstructive sleep apnea (OSA). She did well during the latest sleep study with CPAP. We will, therefore, arrange for a machine for home use through a DME (durable medical equipment) company of Her choice; and I will see the patient back in follow-up in about 10 weeks, can schedule with one of our NPs too for timing. Please also explain to the patient that I will be looking out for compliance data, which can be downloaded from the machine (stored on an SD card, that is inserted in the machine) or via remote access through a modem, that is built into the machine. At the time of the followup appointment we will discuss sleep study results and how it is going with PAP treatment at home. Please advise patient to bring Her machine at the time of the first FU visit, even though this is cumbersome. Bringing the machine for every visit after that will likely not be needed, but often helps for the first visit to troubleshoot if needed. Please re-enforce the importance of compliance with treatment and the need for [place] to monitor compliance data - often an insurance requirement and actually good feedback for the patient as far as how they are doing.  Also remind patient, that any interim PAP machine or mask issues should be first addressed with the DME company, as they can often help better with technical and mask fit issues. Please ask if patient has a preference regarding DME company.  Please also make sure, the patient has a follow-up appointment with me or MM or CM in about 10 weeks from the setup date, thanks.  Once you have spoken to the patient - and faxed/routed report to PCP and referring MD (if other than PCP), you can close this encounter, thanks,   Huston Foley, MD, PhD [facility] ([facility]

## 2016-11-09 NOTE — Progress Notes (Signed)
This study showed moderate PLMs with mild arousals; baseline study showed mild PLMs with little arousals. Clinical correlation is required.

## 2016-11-17 ENCOUNTER — Encounter: Payer: Self-pay | Admitting: Family Medicine

## 2016-11-17 ENCOUNTER — Ambulatory Visit (INDEPENDENT_AMBULATORY_CARE_PROVIDER_SITE_OTHER): Payer: Medicare Other | Admitting: Family Medicine

## 2016-11-17 VITALS — BP 119/76 | HR 63 | Temp 98.0°F | Resp 18 | Ht 65.75 in | Wt 259.0 lb

## 2016-11-17 DIAGNOSIS — F5104 Psychophysiologic insomnia: Secondary | ICD-10-CM

## 2016-11-17 DIAGNOSIS — I10 Essential (primary) hypertension: Secondary | ICD-10-CM | POA: Diagnosis not present

## 2016-11-17 DIAGNOSIS — E7800 Pure hypercholesterolemia, unspecified: Secondary | ICD-10-CM | POA: Diagnosis not present

## 2016-11-17 DIAGNOSIS — R7302 Impaired glucose tolerance (oral): Secondary | ICD-10-CM

## 2016-11-17 DIAGNOSIS — G4733 Obstructive sleep apnea (adult) (pediatric): Secondary | ICD-10-CM

## 2016-11-17 DIAGNOSIS — R413 Other amnesia: Secondary | ICD-10-CM

## 2016-11-17 DIAGNOSIS — F411 Generalized anxiety disorder: Secondary | ICD-10-CM | POA: Diagnosis not present

## 2016-11-17 DIAGNOSIS — I219 Acute myocardial infarction, unspecified: Secondary | ICD-10-CM

## 2016-11-17 MED ORDER — LISINOPRIL-HYDROCHLOROTHIAZIDE 20-25 MG PO TABS: 1.0000 | ORAL_TABLET | Freq: Every day | ORAL | 3 refills | Status: DC

## 2016-11-17 NOTE — Progress Notes (Signed)
Subjective:    Patient ID: [name] [name], female    DOB: 1951-04-08, 66 y.o.   MRN: [id number]  11/17/2016  Hypertension (6 month follow-up); Hyperlipidemia; and Depression (with Anxiety)   HPI This 66 y.o. female presents for six month follow-up hypertension, hypercholesterolemia, anxiety and depression.  On 08/26/16, acute onset of blank outs.  S/p CT scan and EEG; s/p neurology consultation; not sure if seizure activity. Having short blank outs.  Like day dreaming.  Follow-up with neurology in 01/2017.  Referred to neurology; s/p EEG that was negative; insomnia treated with Trazodone and Lexapro stopped.  S/p sleep study; CPAP recommended.   Receives training on CPAP next week; used CPAP with second test and slept all night.    Really wanting to drive.  Sisters all talking with patient in 2-3 times per day.  No recurrent episodes in Sep 25, 2016.  Two episodes in April and May.   Both visits for follow-up in September.    Trazodone really made patient feel badly. Prescribed two different sleep medications; tried a third medication for insomnia that was too expensive.  Sleeping really well.  Restarted Lexapro.  Quit crying when restarted it.      BP Readings from Last 3 Encounters:  11/17/16 119/76  10/13/16 117/72  09/28/16 128/76   Wt Readings from Last 3 Encounters:  11/17/16 259 lb (117.5 kg)  10/13/16 263 lb 12.8 oz (119.7 kg)  09/28/16 262 lb (118.8 kg)   Immunization History  Administered Date(s) Administered  . Influenza-Unspecified 02/20/2013, 01/18/2014, 01/09/2016  . Meningococcal Conjugate 10/23/2014  . Pneumococcal Conjugate-13 04/17/2014  . Pneumococcal Polysaccharide-23 04/18/2013  . Tdap 03/08/2007  . Zoster 05/03/2010     .  Review of Systems  Constitutional: Positive for fatigue. Negative for chills, diaphoresis and fever.  Eyes: Negative for visual disturbance.  Respiratory: Negative for cough and shortness of breath.   Cardiovascular: Negative for  chest pain, palpitations and leg swelling.  Gastrointestinal: Negative for abdominal pain, constipation, diarrhea, nausea and vomiting.  Endocrine: Negative for cold intolerance, heat intolerance, polydipsia, polyphagia and polyuria.  Neurological: Negative for dizziness, tremors, seizures, syncope, facial asymmetry, speech difficulty, weakness, light-headedness, numbness and headaches.  Psychiatric/Behavioral: Positive for dysphoric mood and sleep disturbance. The patient is nervous/anxious.     Past Medical History:  Diagnosis Date  . Anxiety   . Cataract   . Chronic insomnia 10/13/2016  . Depression   . Gait disorder 03/14/2014  . HTN (hypertension)   . Hx of cardiovascular stress test    Lexiscan Myoview (1/16):  No ischemia, EF 68%, breast atten; Low Risk  . Hyperlipemia   . Memory difficulty 03/14/2014  . Meningitis due to bacteria 01-18-14  . OSA (obstructive sleep apnea) 11/30/2016  . TIA (transient ischemic attack)    Past Surgical History:  Procedure Laterality Date  . NO PAST SURGERIES     No Known Allergies  Social History   Social History  . Marital status: Divorced    Spouse name: N/A  . Number of children: 1  . Years of education: college   Occupational History  . Retired [employer]   Social History Main Topics  . Smoking status: Former Smoker    Packs/day: 0.50    Years: 40.00    Types: Cigarettes    Quit date: 07/09/2011  . Smokeless tobacco: Never Used  . Alcohol use 0.0 oz/week     Comment: Rare  . Drug use: No  . Sexual activity: No  Other Topics Concern  . Not on file   Social History Narrative   Marital status: divorced x 35 years; not dating; not interested in 2018      Children: 1 child/son (44); 2 grandchildren; [place]      Lives:  Alone in [place] in [place].      Employment: retired 2008; Administrator, arts.      Tobacco: quit smoking in 2013.  Smoked x many years       Alcohol: none; holidays only      Exercise:  none; quit exercising two years ago in 2015.  Plans to join [employer]      Seatbelt: 100%; no texting. Drives locally       ADLs: drives locally; independent with ADLs.  Has cane for long distances      Advanced Directives:          Patient is single lives at home alone, has 1 child   Patient is right handed   Education level is 1 year of college   Caffeine consumption is 2 cups daily   Family History  Problem Relation Age of Onset  . Heart attack Mother 23  . Stroke Mother   . Hypertension Mother   . Heart attack Father 50  . Cerebral aneurysm Sister   . Hypertension Sister   . Kidney disease Sister        ESRD; HD in last year  . Hypertension Brother   . Hyperlipidemia Brother   . Rheum arthritis Sister   . Arthritis Sister   . Hypertension Brother   . Hyperlipidemia Brother   . Hyperlipidemia Brother   . Hypertension Brother        Objective:    BP 119/76   Pulse 63   Temp 98 F (36.7 C) (Oral)   Resp 18   Ht 5' 5.75" (1.67 m)   Wt 259 lb (117.5 kg)   SpO2 95%   BMI 42.12 kg/m  Physical Exam  Constitutional: She is oriented to person, place, and time. She appears well-developed and well-nourished. No distress.  HENT:  Head: Normocephalic and atraumatic.  Right Ear: External ear normal.  Left Ear: External ear normal.  Nose: Nose normal.  Mouth/Throat: Oropharynx is clear and moist.  Eyes: Pupils are equal, round, and reactive to light. Conjunctivae and EOM are normal.  Neck: Normal range of motion. Neck supple. Carotid bruit is not present. No thyromegaly present.  Cardiovascular: Normal rate, regular rhythm, normal heart sounds and intact distal pulses.  Exam reveals no gallop and no friction rub.   No murmur heard. Pulmonary/Chest: Effort normal and breath sounds normal. She has no wheezes. She has no rales.  Abdominal: Soft. Bowel sounds are normal. She exhibits no distension and no mass. There is no tenderness. There is no rebound and no guarding.   Lymphadenopathy:    She has no cervical adenopathy.  Neurological: She is alert and oriented to person, place, and time. No cranial nerve deficit.  Skin: Skin is warm and dry. No rash noted. She is not diaphoretic. No erythema. No pallor.  Psychiatric: She has a normal mood and affect. Her behavior is normal.        Assessment & Plan:   1. Essential hypertension   2. Demand myocardial ischemic related infarction   3. Glucose intolerance (impaired glucose tolerance)   4. Generalized anxiety disorder   5. Pure hypercholesterolemia   6. Transient memory loss   7. Chronic insomnia  8. OSA (obstructive sleep apnea)   9. Morbid obesity (HCC)    -new onset transient memory loss and alterations of conciousness.  S/p neurology consultation.  Reviewed sleep study results with pt in detail during visit.  Recommend CPAP therapy and weight loss.  --recommend weight loss, exercise for 30-60 minutes five days per week; recommend 1200 kcal restriction per day with a minimum of 60 grams of protein per day. -will combine antihypertensive medications to decrease pill burden. -prolonged face-to-face for 40 minutes with greater than 50% of time dedicated to counseling and coordination of care.   Orders Placed This Encounter  Procedures  . CBC with Differential/Platelet  . Comprehensive metabolic panel    Order Specific Question:   Has the patient fasted?    Answer:   Yes  . Hemoglobin A1c  . Lipid panel    Order Specific Question:   Has the patient fasted?    Answer:   Yes   Meds ordered this encounter  Medications  . lisinopril-hydrochlorothiazide (PRINZIDE,ZESTORETIC) 20-25 MG tablet    Sig: Take 1 tablet by mouth daily.    Dispense:  90 tablet    Refill:  3    Return in about 6 months (around 05/20/2017) for complete physical examiniation.   Marie Chow Paulita Fujita, M.D. Primary Care at [facility] [address] [place], [place]   [phone] phone [phone] fax

## 2016-11-17 NOTE — Patient Instructions (Addendum)
Recommend a total of 60 grams of protein per day; this would be 20 grams of protein per meal.  Recommend 1500 calories per day which is 500 calories per meal.   [email or web address]   Fat and Cholesterol Restricted Diet Getting too much fat and cholesterol in your diet may cause health problems. Following this diet helps keep your fat and cholesterol at normal levels. This can keep you from getting sick. What types of fat should I choose?  Choose monosaturated and polyunsaturated fats. These are found in foods such as olive oil, canola oil, flaxseeds, walnuts, almonds, and seeds.  Eat more omega-3 fats. Good choices include salmon, mackerel, sardines, tuna, flaxseed oil, and ground flaxseeds.  Limit saturated fats. These are in animal products such as meats, butter, and cream. They can also be in plant products such as palm oil, palm kernel oil, and coconut oil.  Avoid foods with partially hydrogenated oils in them. These contain trans fats. Examples of foods that have trans fats are stick margarine, some tub margarines, cookies, crackers, and other baked goods. What general guidelines do I need to follow?  Check food labels. Look for the words "trans fat" and "saturated fat."  When preparing a meal: ? Fill half of your plate with vegetables and green salads. ? Fill one fourth of your plate with whole grains. Look for the word "whole" as the first word in the ingredient list. ? Fill one fourth of your plate with lean protein foods.  Eat more foods that have fiber, like apples, carrots, beans, peas, and barley.  Eat more home-cooked foods. Eat less at restaurants and buffets.  Limit or avoid alcohol.  Limit foods high in starch and sugar.  Limit fried foods.  Cook foods without frying them. Baking, boiling, grilling, and broiling are all great options.  Lose weight if you are overweight. Losing even a small amount of weight can help your overall health. It can also help prevent  diseases such as diabetes and heart disease. What foods can I eat? Grains Whole grains, such as whole wheat or whole grain breads, crackers, cereals, and pasta. Unsweetened oatmeal, bulgur, barley, quinoa, or brown rice. Corn or whole wheat flour tortillas. Vegetables Fresh or frozen vegetables (raw, steamed, roasted, or grilled). Green salads. Fruits All fresh, canned (in natural juice), or frozen fruits. Meat and Other Protein Products Ground beef (85% or leaner), grass-fed beef, or beef trimmed of fat. Skinless chicken or [place]. Ground chicken or [place]. Pork trimmed of fat. All fish and seafood. Eggs. Dried beans, peas, or lentils. Unsalted nuts or seeds. Unsalted canned or dry beans. Dairy Low-fat dairy products, such as skim or 1% milk, 2% or reduced-fat cheeses, low-fat ricotta or cottage cheese, or plain low-fat yogurt. Fats and Oils Tub margarines without trans fats. Light or reduced-fat mayonnaise and salad dressings. Avocado. Olive, canola, sesame, or safflower oils. Natural peanut or almond butter (choose ones without added sugar and oil). The items listed above may not be a complete list of recommended foods or beverages. Contact your dietitian for more options. What foods are not recommended? Grains White bread. White pasta. White rice. Cornbread. Bagels, pastries, and croissants. Crackers that contain trans fat. Vegetables White potatoes. Corn. Creamed or fried vegetables. Vegetables in a cheese sauce. Fruits Dried fruits. Canned fruit in light or heavy syrup. Fruit juice. Meat and Other Protein Products Fatty cuts of meat. Ribs, chicken wings, bacon, sausage, bologna, salami, chitterlings, fatback, hot dogs, bratwurst, and packaged luncheon meats. Liver and  organ meats. Dairy Whole or 2% milk, cream, half-and-half, and cream cheese. Whole milk cheeses. Whole-fat or sweetened yogurt. Full-fat cheeses. Nondairy creamers and whipped toppings. Processed cheese, cheese spreads,  or cheese curds. Sweets and Desserts Corn syrup, sugars, honey, and molasses. Candy. Jam and jelly. Syrup. Sweetened cereals. Cookies, pies, cakes, donuts, muffins, and ice cream. Fats and Oils Butter, stick margarine, lard, shortening, ghee, or bacon fat. Coconut, palm kernel, or palm oils. Beverages Alcohol. Sweetened drinks (such as sodas, lemonade, and fruit drinks or punches). The items listed above may not be a complete list of foods and beverages to avoid. Contact your dietitian for more information. This information is not intended to replace advice given to you by your health care provider. Make sure you discuss any questions you have with your health care provider. Document Released: 10/19/2011 Document Revised: 12/25/2015 Document Reviewed: 07/19/2013 Elsevier Interactive Patient Education  2018 [employer].   IF you received an x-ray today, you will receive an invoice from [facility]. Please contact [facility] at [phone] with questions or concerns regarding your invoice.   IF you received labwork today, you will receive an invoice from [place]. Please contact [facility] at [phone] with questions or concerns regarding your invoice.   Our billing staff will not be able to assist you with questions regarding bills from these companies.  You will be contacted with the lab results as soon as they are available. The fastest way to get your results is to activate your My Chart account. Instructions are located on the last page of this paperwork. If you have not heard from [place] regarding the results in 2 weeks, please contact this office.

## 2016-11-18 LAB — COMPREHENSIVE METABOLIC PANEL
ALK PHOS: 87 IU/L (ref 39–117)
ALT: 16 IU/L (ref 0–32)
AST: 18 IU/L (ref 0–40)
Albumin/Globulin Ratio: 1.4 (ref 1.2–2.2)
Albumin: 4.3 g/dL (ref 3.6–4.8)
BILIRUBIN TOTAL: 0.4 mg/dL (ref 0.0–1.2)
BUN/Creatinine Ratio: 22 (ref 12–28)
BUN: 20 mg/dL (ref 8–27)
CHLORIDE: 102 mmol/L (ref 96–106)
CO2: 22 mmol/L (ref 20–29)
CREATININE: 0.89 mg/dL (ref 0.57–1.00)
Calcium: 9.3 mg/dL (ref 8.7–10.3)
GFR calc Af Amer: 79 mL/min/{1.73_m2} (ref >59)
GFR calc non Af Amer: 68 mL/min/{1.73_m2} (ref >59)
GLUCOSE: 97 mg/dL (ref 65–99)
Globulin, Total: 3 g/dL (ref 1.5–4.5)
Potassium: 4.3 mmol/L (ref 3.5–5.2)
SODIUM: 140 mmol/L (ref 134–144)
Total Protein: 7.3 g/dL (ref 6.0–8.5)

## 2016-11-18 LAB — HEMOGLOBIN A1C
ESTIMATED AVERAGE GLUCOSE: 131 mg/dL
HEMOGLOBIN A1C: 6.2 % — AB (ref 4.8–5.6)

## 2016-11-18 LAB — CBC WITH DIFFERENTIAL/PLATELET
BASOS ABS: 0 10*3/uL (ref 0.0–0.2)
Basos: 0 %
EOS (ABSOLUTE): 0.1 10*3/uL (ref 0.0–0.4)
Eos: 3 %
Hematocrit: 38.5 % (ref 34.0–46.6)
Hemoglobin: 12.7 g/dL (ref 11.1–15.9)
Immature Grans (Abs): 0 10*3/uL (ref 0.0–0.1)
Immature Granulocytes: 0 %
LYMPHS ABS: 1.2 10*3/uL (ref 0.7–3.1)
Lymphs: 34 %
MCH: 26.6 pg (ref 26.6–33.0)
MCHC: 33 g/dL (ref 31.5–35.7)
MCV: 81 fL (ref 79–97)
MONOS ABS: 0.4 10*3/uL (ref 0.1–0.9)
Monocytes: 11 %
Neutrophils Absolute: 1.9 10*3/uL (ref 1.4–7.0)
Neutrophils: 52 %
Platelets: 311 10*3/uL (ref 150–379)
RBC: 4.77 x10E6/uL (ref 3.77–5.28)
RDW: 13.5 % (ref 12.3–15.4)
WBC: 3.6 10*3/uL (ref 3.4–10.8)

## 2016-11-18 LAB — LIPID PANEL
Chol/HDL Ratio: 2.9 ratio (ref 0.0–4.4)
Cholesterol, Total: 179 mg/dL (ref 100–199)
HDL: 62 mg/dL (ref >39)
LDL Calculated: 93 mg/dL (ref 0–99)
Triglycerides: 119 mg/dL (ref 0–149)
VLDL Cholesterol Cal: 24 mg/dL (ref 5–40)

## 2016-11-24 DIAGNOSIS — G009 Bacterial meningitis, unspecified: Secondary | ICD-10-CM | POA: Diagnosis not present

## 2016-11-24 DIAGNOSIS — G4733 Obstructive sleep apnea (adult) (pediatric): Secondary | ICD-10-CM | POA: Diagnosis not present

## 2016-11-30 ENCOUNTER — Telehealth: Payer: Self-pay | Admitting: Family Medicine

## 2016-11-30 ENCOUNTER — Encounter: Payer: Self-pay | Admitting: Family Medicine

## 2016-11-30 DIAGNOSIS — G4733 Obstructive sleep apnea (adult) (pediatric): Secondary | ICD-10-CM | POA: Insufficient documentation

## 2016-11-30 HISTORY — DX: Obstructive sleep apnea (adult) (pediatric): G47.33

## 2016-11-30 NOTE — Telephone Encounter (Addendum)
PATIENT WOULD LIKE TO ASK DR. Katrinka Blazing IF SHE CAN TAKE AN OVER-THE-COUNTER SLEEP AID TO HELP HER SLEEP. SHE WOULD RATHER TAKE SOMETHING LIKE TYLENOL OR ANOTHER GOOD BRAND NAME THAT SHE CAN RECOMMEND THAT WILL BE ALRIGHT WITH HER OTHER MEDICATIONS THAT SHE TAKES. BEST PHONE [phone] (CELL) PHARMACY CHOICE IS [facility] ON [address]. MBC

## 2016-12-02 NOTE — Telephone Encounter (Signed)
Please advise. See note below. 

## 2016-12-04 NOTE — Telephone Encounter (Signed)
Patient could try Melatonin, Unisom, Tylenol PM for insomnia. (Neurology has prescribed Remeron, Trazodone, and Belsomra in June 2018.)

## 2016-12-04 NOTE — Telephone Encounter (Signed)
Spoke with patient and advised that she could try Melatonin, Unisom or Tylenol PM for insomnia. Patient stated that she slept all night last night with her CPAP and she stated that maybe she was getting use to it. Patient stated that she would try the Tylenol PM and see if that helps some. Patient was very appreciative for the call back.

## 2016-12-25 DIAGNOSIS — G4733 Obstructive sleep apnea (adult) (pediatric): Secondary | ICD-10-CM | POA: Diagnosis not present

## 2017-01-13 ENCOUNTER — Encounter: Payer: Self-pay | Admitting: Neurology

## 2017-01-13 ENCOUNTER — Ambulatory Visit (INDEPENDENT_AMBULATORY_CARE_PROVIDER_SITE_OTHER): Payer: Medicare Other | Admitting: Neurology

## 2017-01-13 VITALS — BP 124/74 | HR 54 | Ht 65.75 in | Wt 259.5 lb

## 2017-01-13 DIAGNOSIS — R404 Transient alteration of awareness: Secondary | ICD-10-CM | POA: Diagnosis not present

## 2017-01-13 DIAGNOSIS — F5104 Psychophysiologic insomnia: Secondary | ICD-10-CM

## 2017-01-13 MED ORDER — LEVETIRACETAM 500 MG PO TABS: 500.0000 mg | ORAL_TABLET | Freq: Two times a day (BID) | ORAL | 5 refills | Status: DC

## 2017-01-13 NOTE — Progress Notes (Signed)
Reason for visit: Transient loss of awareness  [name] [name] is an 66 y.o. female  History of present illness:  [name] [name] is a 66 year old right-handed black female with a history of episodes of unresponsiveness, staring off. The patient also has sleep apnea, she has been able to tolerate her CPAP well, she is sleeping much better at night. She did not tolerate sleeping medication such as trazodone, Remeron, or Belsomra. The patient is getting 7-9 hours of sleep each night. She feels rested during the day. She has had a single staring event that was witnessed on 12/22/2016. The patient was with her cousin, the patient did not respond for about 1 minute, and then she began responding normally. There was no jerking or twitching with the event. The patient herself has no recollection of the episode. She returns for an evaluation. A prior EEG study was unremarkable.  Past Medical History:  Diagnosis Date  . Anxiety   . Cataract   . Chronic insomnia 10/13/2016  . Depression   . Gait disorder 03/14/2014  . HTN (hypertension)   . Hx of cardiovascular stress test    Lexiscan Myoview (1/16):  No ischemia, EF 68%, breast atten; Low Risk  . Hyperlipemia   . Memory difficulty 03/14/2014  . Meningitis due to bacteria 01-18-14  . OSA (obstructive sleep apnea) 11/30/2016  . TIA (transient ischemic attack)     Past Surgical History:  Procedure Laterality Date  . NO PAST SURGERIES      Family History  Problem Relation Age of Onset  . Heart attack Mother 32  . Stroke Mother   . Hypertension Mother   . Heart attack Father 47  . Cerebral aneurysm Sister   . Hypertension Sister   . Kidney disease Sister        ESRD; HD in last year  . Hypertension Brother   . Hyperlipidemia Brother   . Rheum arthritis Sister   . Arthritis Sister   . Hypertension Brother   . Hyperlipidemia Brother   . Hyperlipidemia Brother   . Hypertension Brother     Social history:  reports that she quit smoking  about 5 years ago. Her smoking use included Cigarettes. She has a 20.00 pack-year smoking history. She has never used smokeless tobacco. She reports that she drinks alcohol. She reports that she does not use drugs.   No Known Allergies  Medications:  Prior to Admission medications   Medication Sig Start Date End Date Taking? Authorizing Provider  aspirin 81 MG chewable tablet Chew 81 mg by mouth daily.   Yes [provider]  carvedilol (COREG) 3.125 MG tablet Take 1 tablet (3.125 mg total) by mouth 2 (two) times daily with a meal. 05/18/16  Yes Ethelda Chick, MD  lisinopril-hydrochlorothiazide (PRINZIDE,ZESTORETIC) 20-25 MG tablet Take 1 tablet by mouth daily. 11/17/16  Yes Ethelda Chick, MD  Multiple Vitamins-Minerals (MULTIVITAMIN PO) Take 1 tablet by mouth daily.   Yes [provider]  simvastatin (ZOCOR) 20 MG tablet Take 1 tablet (20 mg total) by mouth daily. 05/18/16  Yes Ethelda Chick, MD    ROS:  Out of a complete 14 system review of symptoms, the patient complains only of the following symptoms, and all other reviewed systems are negative.  Transient loss of awareness  Blood pressure 124/74, pulse (!) 54, height 5' 5.75" (1.67 m), weight 259 lb 8 oz (117.7 kg).  Physical Exam  General: The patient is alert and cooperative at the time  of the examination. The patient is moderately obese.  Skin: No significant peripheral edema is noted.   Neurologic Exam  Mental status: The patient is alert and oriented x 3 at the time of the examination. The patient has apparent normal recent and remote memory, with an apparently normal attention span and concentration ability.   Cranial nerves: Facial symmetry is present. Speech is normal, no aphasia or dysarthria is noted. Extraocular movements are full. Visual fields are full.  Motor: The patient has good strength in all 4 extremities.  Sensory examination: Soft touch sensation is symmetric on the face, arms, and  legs.  Coordination: The patient has good finger-nose-finger and heel-to-shin bilaterally.  Gait and station: The patient has a normal gait. Tandem gait is normal. Romberg is negative. No drift is seen.  Reflexes: Deep tendon reflexes are symmetric.   Assessment/Plan:  1. Episodes of transient loss of awareness  2. Sleep apnea on CPAP  The patient has had another event of staring off. The description of the event is consistent with seizures, the patient will be in empirically treated with Keppra, she will start 500 mg tablets, taking 1 tablet twice daily. The patient will follow-up in 4 months, if she continues to well, she can begin to drive 6 months after the last event.  Marlan Palau MD 01/13/2017 10:31 AM  [facility] [address] [place], [place] [address]  Phone [phone] Fax [phone]

## 2017-01-13 NOTE — Patient Instructions (Signed)
   We will start Keppa 500 mg tablets, start 1/2 tablet twice a day for 2 weeks, then take one full tablet twice a day.

## 2017-01-17 ENCOUNTER — Encounter: Payer: Self-pay | Admitting: Neurology

## 2017-01-19 ENCOUNTER — Encounter: Payer: Self-pay | Admitting: Neurology

## 2017-01-19 ENCOUNTER — Ambulatory Visit: Payer: Self-pay | Admitting: Neurology

## 2017-01-19 VITALS — BP 111/64 | HR 53 | Ht 65.0 in | Wt 259.5 lb

## 2017-01-19 DIAGNOSIS — Z9989 Dependence on other enabling machines and devices: Secondary | ICD-10-CM | POA: Diagnosis not present

## 2017-01-19 DIAGNOSIS — G4733 Obstructive sleep apnea (adult) (pediatric): Secondary | ICD-10-CM

## 2017-01-19 NOTE — Progress Notes (Signed)
Subjective:    Patient ID: [name] [name] is a 66 y.o. female.  HPI     Interim history:   Ms. [name] [name] is a 66 year old right-handed woman with an underlying medical history of anxiety, depression, hypertension, hyperlipidemia, history of meningitis in 2015, anemia, gait disorder, memory loss, prior long-standing history of smoking, and morbid obesity with a BMI of over 40, who presents for follow-up consultation of her obstructive sleep apnea, after recent sleep study testing. The patient is accompanied by her sister today. I first met her on 09/28/2016 at the request of her primary care physician, at which time she reported recent spells of decreased awareness, snoring, daytime somnolence. She was advised to make a follow-up appointment with Dr. Jannifer Franklin who had previously followed her after her diagnosis of meningitis in 2015. I suggested we proceed with EEG testing as well as sleep study testing. She had a baseline sleep study, followed by a CPAP titration study. I went over her test results with her in detail today. Baseline sleep study from 10/05/2016 showed a sleep latency markedly delayed of 261 minutes and REM latency was also delayed, sleep efficiency was reduced at 51.1%. She had absence of slow-wave sleep and a normal percentage of REM sleep. Total AHI was highly elevated at 48.3 per hour, REM AHI was 110.9 per hour. Average oxygen saturation was 95%, nadir was 58%. She had mild PLMS with minimal arousals. Based on her symptoms and her test results I asked her to return for a full night CPAP titration study. She had this on 10/26/2016. Sleep efficiency was 86.2%, sleep latency 9.5 minutes and REM latency was 59 minutes. She had a normal percentage of REM sleep. She was fitted with nasal pillows. CPAP was titrated from 5 cm to 14 cm. On the final pressure, her AHI was 0 per hour with supine non-REM sleep achieved an O2 nadir was 93%. Based on her test results I prescribed CPAP therapy for home  use.  She had an EEG on 09/30/2016 which was reported as normal in the awake and drowsy states.  Today, 01/19/2017 (all dictated new, as well as above notes, some dictation done in note pad or Word, outside of chart, may appear as copied):  I reviewed her CPAP compliance data from 12/19/2016 through 01/17/2017 which is a total of 30 days, during which time she used her CPAP every night with percent used days greater than 4 hours at 100%, indicating superb compliance with an average usage of 7 hours and 40 minutes, residual AHI low at 1.5 per hour, leak acceptable with the 95th percentile at 13.2 L/m on a pressure of 14 cm. She reports feeling better, sleeping better and less sleepy during the day, but since recently starting [place] per Dr. Jannifer Franklin, she feels some daytime drowsiness. Not driving.   The patient's allergies, current medications, family history, past medical history, past social history, past surgical history and problem list were reviewed and updated as appropriate.   Previously (copied from previous notes for reference):   09/28/2016: (She) reports snoring and excessive daytime somnolence as well as recent spells of decreased consciousness.  Her Epworth sleepiness score is 11 out of 24 today, fatigue score is 42 out of 63. She is divorced and lives alone. She has 1 child. She quit smoking some 5 years ago and drinks alcohol occasionally in the form of one glass of wine typically. She drinks caffeine in the form of coffee, 2 cups daily. She reports spells of inattentiveness. She  has had 2 spells some 30 days ago. She had a brain MRI w/wo contrast on 08/26/16, which I reviewed: IMPRESSION: 1. No acute intracranial infarct or other process identified. 2. Partially empty sella with prominent CSF within Meckel's cave bilaterally. This appearance can be seen in the setting of idiopathic intracranial hypertension (pseudotumor cerebri the), but could also reflect a normal anatomic variant.  Correlation with CSF opening pressure could be performed for confirmatory purposes as clinically indicated. 3. Otherwise normal brain MRI. She had CTH wo contrast on 09/25/16, which I reviewed: IMPRESSION: No acute abnormality is noted. She presented to the emergency room on 09/25/2016 with a staring spell. She was referred to neurology at [facility]. She previously saw Dr. Jannifer Franklin in this office at the time of her meningitis, last visit in March 2016 with our nurse practitioner. She has gained weight in the past 3 years, almost 20 lb. Has been told, snoring is loud.  She has woken up with a sense of gasping. She has occasional morning headaches, has had RLS Sx.  She tosses and turns, has nocturia about 1-2 times per night. She naps occasionally. She has difficulty falling asleep and staying asleep, no sleep aid.  Her sister reports that she had spells of inattention and losing contact with reality in 2014. She had workup for this as I understand. She has no history of convulsions or trembling, shaking spells. The patient has no recollection of the event during the time which last just a few minutes at a time. She has been told by the emergency room physician not to drive. The patient denies frank depression but endorses anxiety. She is tearful multiple times during the appointment. Her sister reports that she has noticed the patient is not very outgoing, stays alone a lot. They lost one sister last year. They were a total of 3 brothers and 4 sisters.  Her Past Medical History Is Significant For: Past Medical History:  Diagnosis Date  . Anxiety   . Cataract   . Chronic insomnia 10/13/2016  . Depression   . Gait disorder 03/14/2014  . HTN (hypertension)   . Hx of cardiovascular stress test    Lexiscan Myoview (1/16):  No ischemia, EF 68%, breast atten; Low Risk  . Hyperlipemia   . Memory difficulty 03/14/2014  . Meningitis due to bacteria 01-18-14  . OSA (obstructive sleep apnea) 11/30/2016  . TIA  (transient ischemic attack)     Her Past Surgical History Is Significant For: Past Surgical History:  Procedure Laterality Date  . NO PAST SURGERIES      Her Family History Is Significant For: Family History  Problem Relation Age of Onset  . Heart attack Mother 65  . Stroke Mother   . Hypertension Mother   . Heart attack Father 26  . Cerebral aneurysm Sister   . Hypertension Sister   . Kidney disease Sister        ESRD; HD in last year  . Hypertension Brother   . Hyperlipidemia Brother   . Rheum arthritis Sister   . Arthritis Sister   . Hypertension Brother   . Hyperlipidemia Brother   . Hyperlipidemia Brother   . Hypertension Brother     Her Social History Is Significant For: Social History   Social History  . Marital status: Divorced    Spouse name: N/A  . Number of children: 1  . Years of education: college   Occupational History  . Retired [employer]   Social History Main Topics  .  Smoking status: Former Smoker    Packs/day: 0.50    Years: 40.00    Types: Cigarettes    Quit date: 07/09/2011  . Smokeless tobacco: Never Used  . Alcohol use 0.0 oz/week     Comment: Rare  . Drug use: No  . Sexual activity: No   Other Topics Concern  . None   Social History Narrative   Marital status: divorced x 35 years; not dating; not interested in 2018      Children: 1 child/son (44); 2 grandchildren; [place]      Lives:  Alone in [place] in [place].      Employment: retired 2008; Engineer, technical sales.      Tobacco: quit smoking in 2013.  Smoked x many years       Alcohol: none; holidays only      Exercise: none; quit exercising two years ago in 2015.  Plans to join [employer]      Seatbelt: 100%; no texting. Drives locally       ADLs: drives locally; independent with ADLs.  Has cane for long distances      Advanced Directives:          Patient is single lives at home alone, has 1 child   Patient is right handed   Education level is 1 year of  college   Caffeine consumption is 2 cups daily    Her Allergies Are:  No Known Allergies:   Her Current Medications Are:  Outpatient Encounter Prescriptions as of 01/19/2017  Medication Sig  . aspirin 81 MG chewable tablet Chew 81 mg by mouth daily.  . carvedilol (COREG) 3.125 MG tablet Take 1 tablet (3.125 mg total) by mouth 2 (two) times daily with a meal.  . levETIRAcetam (KEPPRA) 500 MG tablet Take 1 tablet (500 mg total) by mouth 2 (two) times daily.  Marland Kitchen lisinopril-hydrochlorothiazide (PRINZIDE,ZESTORETIC) 20-25 MG tablet Take 1 tablet by mouth daily.  . Multiple Vitamins-Minerals (MULTIVITAMIN PO) Take 1 tablet by mouth daily.  . simvastatin (ZOCOR) 20 MG tablet Take 1 tablet (20 mg total) by mouth daily.   No facility-administered encounter medications on file as of 01/19/2017.   :  Review of Systems:  Out of a complete 14 point review of systems, all are reviewed and negative with the exception of these symptoms as listed below: Review of Systems  Neurological:       Patient reports that she is feeling better since starting the CPAP. Pt uses [facility] as her DME.    Objective:  Neurological Exam  Physical Exam Physical Examination:   Vitals:   01/19/17 1154  BP: 111/64  Pulse: (!) 53   General Examination: The patient is a very pleasant 66 y.o. female in no acute distress. She appears well-developed and well-nourished and well groomed.   HEENT: Normocephalic, atraumatic, pupils are equal, round and reactive to light and accommodation. Extraocular tracking is good without limitation to gaze excursion or nystagmus noted. Normal smooth pursuit is noted. Hearing is grossly intact. Face is symmetric with normal facial animation and normal facial sensation. Speech is clear with no dysarthria noted. There is no hypophonia. There is no lip, neck/head, jaw or voice tremor. Neck is supple with full range of passive and active motion. There are no carotid bruits on auscultation.  Oropharynx exam reveals: mild mouth dryness, adequate dental hygiene and moderate airway crowding. Tongue protrudes centrally and palate elevates symmetrically. Tonsils are 1+ in size.   Chest: Clear to auscultation without  wheezing, rhonchi or crackles noted.  Heart: S1+S2+0, regular and normal without murmurs, rubs or gallops noted.   Abdomen: Soft, non-tender and non-distended with normal bowel sounds appreciated on auscultation.  Extremities: There is no pitting edema in the distal lower extremities bilaterally. Pedal pulses are intact.  Skin: Warm and dry without trophic changes noted.  Musculoskeletal: exam reveals no obvious joint deformities, tenderness or joint swelling or erythema.   Neurologically:  Mental status: The patient is awake, alert and oriented in all 4 spheres. Her immediate and remote memory, attention, language skills and fund of knowledge are appropriate. There is no evidence of aphasia, agnosia, apraxia or anomia. Speech is clear with normal prosody and enunciation. Thought process is linear. Mood is constricted and affect is blunted.  Cranial nerves II - XII are as described above under HEENT exam. In addition: shoulder shrug is normal with equal shoulder height noted. Motor exam: Normal bulk, strength and tone is noted. There is no drift, tremor or rebound. Reflexes are 1+ in the upper extremities, trace in the lower extremities. Fine motor skills and coordination: grossly intact.  Cerebellar testing: No dysmetria or intention tremor. There is no truncal or gait ataxia.  Sensory exam: intact to light touch.  Gait, station and balance: She stands easily. Posture is age-appropriate and stance is narrow based. Gait shows normal stride length and normal pace. No problems turning are noted.   Assessment and Plan:  In summary, BRECKYN TROYER is a very pleasant 66 year old female with an underlying medical history of anxiety, depression, hypertension,  hyperlipidemia, history of meningitis in 2015, anemia, gait disorder, memory loss, prior long-standing history of smoking, and morbid obesity with a BMI of over 40, who presents for follow up consultation of her Obstructive sleep apnea. She had a baseline sleep study, followed by a CPAP titration study in June 2018. She has severe obstructive sleep apnea, we talked about her test results in detail today and reviewed her compliance data for the last 30 days. She is fully compliant with treatment, apnea is under control, she is benefiting from treatment, indicating better sleep consolidation, improved daytime somnolence. She has had an interim follow-up after her EEG with Dr. Jannifer Franklin. She was recently started on Keppra and has noticed a little bit of increase in drowsiness from it. She is advised that some of the daytime somnolence from medication can improve with time as her body gets used to it. She is commended for her CPAP adherence and advised to follow-up in 6 months, sooner as needed. I answered all their questions today and the patient and her sister were in agreement.  I spent 25 minutes in total face-to-face time with the patient, more than 50% of which was spent in counseling and coordination of care, reviewing test results, reviewing medication and discussing or reviewing the diagnosis of OSA, its prognosis and treatment options. Pertinent laboratory and imaging test results that were available during this visit with the patient were reviewed by me and considered in my medical decision making (see chart for details).

## 2017-01-19 NOTE — Patient Instructions (Signed)

## 2017-01-24 DIAGNOSIS — G4733 Obstructive sleep apnea (adult) (pediatric): Secondary | ICD-10-CM | POA: Diagnosis not present

## 2017-01-24 DIAGNOSIS — G009 Bacterial meningitis, unspecified: Secondary | ICD-10-CM | POA: Diagnosis not present

## 2017-01-25 DIAGNOSIS — G4733 Obstructive sleep apnea (adult) (pediatric): Secondary | ICD-10-CM | POA: Diagnosis not present

## 2017-02-24 DIAGNOSIS — G4733 Obstructive sleep apnea (adult) (pediatric): Secondary | ICD-10-CM | POA: Diagnosis not present

## 2017-02-28 ENCOUNTER — Telehealth: Payer: Self-pay | Admitting: Neurology

## 2017-02-28 DIAGNOSIS — G4733 Obstructive sleep apnea (adult) (pediatric): Secondary | ICD-10-CM | POA: Diagnosis not present

## 2017-02-28 DIAGNOSIS — G009 Bacterial meningitis, unspecified: Secondary | ICD-10-CM | POA: Diagnosis not present

## 2017-02-28 MED ORDER — LEVETIRACETAM 750 MG PO TABS: 750.0000 mg | ORAL_TABLET | Freq: Two times a day (BID) | ORAL | 5 refills | Status: DC

## 2017-02-28 NOTE — Telephone Encounter (Signed)
I called the patient.  She had an event of staring off 4 days ago, apparently this was recorded on a cell phone of someone that was with her at the time.  We will go up on the Keppra taking 750 mg twice daily the patient is not to operate a motor vehicle.  She will follow-up in February.

## 2017-02-28 NOTE — Telephone Encounter (Signed)
Patient was told she had a silent seizure last Thursday and wants to speak to Dr. Anne Hahn regarding altering levETIRAcetam (KEPPRA) 500 MG tablet as discussed with him at the last visit. I advsed Dr. Anne Hahn is out of the office today but she wanted a call back when Dr. Anne Hahn returns.

## 2017-02-28 NOTE — Addendum Note (Signed)
Addended by: York Spaniel on: 02/28/2017 05:18 PM   Modules accepted: Orders

## 2017-03-27 DIAGNOSIS — G4733 Obstructive sleep apnea (adult) (pediatric): Secondary | ICD-10-CM | POA: Diagnosis not present

## 2017-04-19 ENCOUNTER — Telehealth: Payer: Self-pay

## 2017-04-19 NOTE — Telephone Encounter (Signed)
Called patient to see if she wanted to schedule AWV in advance of upcoming CPE. Patient said [facility] Medicare came out and did AWV in her home and she thinks it was in November, 2018.    Sherle Poe, B.A.  Care Guide - Primary Care at [facility] [phone]

## 2017-04-26 DIAGNOSIS — G4733 Obstructive sleep apnea (adult) (pediatric): Secondary | ICD-10-CM | POA: Diagnosis not present

## 2017-05-05 ENCOUNTER — Other Ambulatory Visit: Payer: Self-pay | Admitting: Family Medicine

## 2017-05-05 DIAGNOSIS — I10 Essential (primary) hypertension: Secondary | ICD-10-CM

## 2017-05-17 ENCOUNTER — Ambulatory Visit (INDEPENDENT_AMBULATORY_CARE_PROVIDER_SITE_OTHER): Payer: Medicare Other

## 2017-05-17 VITALS — BP 108/76 | HR 71 | Ht 65.0 in | Wt 261.0 lb

## 2017-05-17 DIAGNOSIS — Z0000 Encounter for general adult medical examination without abnormal findings: Secondary | ICD-10-CM | POA: Diagnosis not present

## 2017-05-17 DIAGNOSIS — E2839 Other primary ovarian failure: Secondary | ICD-10-CM

## 2017-05-17 DIAGNOSIS — E785 Hyperlipidemia, unspecified: Secondary | ICD-10-CM

## 2017-05-17 DIAGNOSIS — I10 Essential (primary) hypertension: Secondary | ICD-10-CM

## 2017-05-17 LAB — COMPREHENSIVE METABOLIC PANEL
A/G RATIO: 1.5 (ref 1.2–2.2)
ALBUMIN: 4.2 g/dL (ref 3.6–4.8)
ALK PHOS: 93 IU/L (ref 39–117)
ALT: 14 IU/L (ref 0–32)
AST: 18 IU/L (ref 0–40)
BUN / CREAT RATIO: 21 (ref 12–28)
BUN: 20 mg/dL (ref 8–27)
Bilirubin Total: 0.4 mg/dL (ref 0.0–1.2)
CO2: 24 mmol/L (ref 20–29)
CREATININE: 0.97 mg/dL (ref 0.57–1.00)
Calcium: 9.2 mg/dL (ref 8.7–10.3)
Chloride: 102 mmol/L (ref 96–106)
GFR calc Af Amer: 70 mL/min/{1.73_m2} (ref >59)
GFR calc non Af Amer: 61 mL/min/{1.73_m2} (ref >59)
GLOBULIN, TOTAL: 2.8 g/dL (ref 1.5–4.5)
Glucose: 99 mg/dL (ref 65–99)
POTASSIUM: 3.8 mmol/L (ref 3.5–5.2)
SODIUM: 142 mmol/L (ref 134–144)
Total Protein: 7 g/dL (ref 6.0–8.5)

## 2017-05-17 LAB — LIPID PANEL
CHOL/HDL RATIO: 2.4 ratio (ref 0.0–4.4)
Cholesterol, Total: 166 mg/dL (ref 100–199)
HDL: 70 mg/dL (ref >39)
LDL Calculated: 81 mg/dL (ref 0–99)
Triglycerides: 73 mg/dL (ref 0–149)
VLDL CHOLESTEROL CAL: 15 mg/dL (ref 5–40)

## 2017-05-17 LAB — CBC WITH DIFFERENTIAL/PLATELET
BASOS: 0 %
Basophils Absolute: 0 10*3/uL (ref 0.0–0.2)
EOS (ABSOLUTE): 0.2 10*3/uL (ref 0.0–0.4)
EOS: 5 %
HEMATOCRIT: 34.7 % (ref 34.0–46.6)
Hemoglobin: 11.2 g/dL (ref 11.1–15.9)
Immature Grans (Abs): 0 10*3/uL (ref 0.0–0.1)
Immature Granulocytes: 0 %
LYMPHS ABS: 1.3 10*3/uL (ref 0.7–3.1)
Lymphs: 34 %
MCH: 25.4 pg — ABNORMAL LOW (ref 26.6–33.0)
MCHC: 32.3 g/dL (ref 31.5–35.7)
MCV: 79 fL (ref 79–97)
MONOS ABS: 0.4 10*3/uL (ref 0.1–0.9)
Monocytes: 9 %
Neutrophils Absolute: 2.1 10*3/uL (ref 1.4–7.0)
Neutrophils: 52 %
PLATELETS: 260 10*3/uL (ref 150–379)
RBC: 4.41 x10E6/uL (ref 3.77–5.28)
RDW: 13.8 % (ref 12.3–15.4)
WBC: 4 10*3/uL (ref 3.4–10.8)

## 2017-05-17 NOTE — Patient Instructions (Addendum)
[name] [name] , Thank you for taking time to come for your Medicare Wellness Visit. I appreciate your ongoing commitment to your health goals. Please review the following plan we discussed and let me know if I can assist you in the future.   Screening recommendations/referrals: Colonoscopy: up to date, next due 06/09/2025 Mammogram: up to date, next due 06/15/2018 Bone Density: [facility] will contact you to set up appointment.  Recommended yearly ophthalmology/optometry visit for glaucoma screening and checkup Recommended yearly dental visit for hygiene and checkup  Vaccinations: Influenza vaccine: up to date Pneumococcal vaccine: up to date Tdap vaccine: declined due to insurance Shingles vaccine: Check with your pharmacy about receiving the Shingrix vaccine     Advanced directives: Advance directive discussed with you today. Even though you declined this today please call our office should you change your mind and we can give you the proper paperwork for you to fill out.   Conditions/risks identified: Try to start exercising more on a daily basis  Next appointment: 05/23/2017 @ 9 am with Dr. Katrinka Blazing, next Medicare Wellness visit is 05/18/2018 @ 9:20 am with Nurse Health Advisor    Preventive Care 65 Years and Older, Female Preventive care refers to lifestyle choices and visits with your health care provider that can promote health and wellness. What does preventive care include?  A yearly physical exam. This is also called an annual well check.  Dental exams once or twice a year.  Routine eye exams. Ask your health care provider how often you should have your eyes checked.  Personal lifestyle choices, including:  Daily care of your teeth and gums.  Regular physical activity.  Eating a healthy diet.  Avoiding tobacco and drug use.  Limiting alcohol use.  Practicing safe sex.  Taking low-dose aspirin every day.  Taking vitamin and mineral supplements as recommended by your  health care provider. What happens during an annual well check? The services and screenings done by your health care provider during your annual well check will depend on your age, overall health, lifestyle risk factors, and family history of disease. Counseling  Your health care provider may ask you questions about your:  Alcohol use.  Tobacco use.  Drug use.  Emotional well-being.  Home and relationship well-being.  Sexual activity.  Eating habits.  History of falls.  Memory and ability to understand (cognition).  Work and work Astronomer.  Reproductive health. Screening  You may have the following tests or measurements:  Height, weight, and BMI.  Blood pressure.  Lipid and cholesterol levels. These may be checked every 5 years, or more frequently if you are over 96 years old.  Skin check.  Lung cancer screening. You may have this screening every year starting at age 69 if you have a 30-pack-year history of smoking and currently smoke or have quit within the past 15 years.  Fecal occult blood test (FOBT) of the stool. You may have this test every year starting at age 55.  Flexible sigmoidoscopy or colonoscopy. You may have a sigmoidoscopy every 5 years or a colonoscopy every 10 years starting at age 69.  Hepatitis C blood test.  Hepatitis B blood test.  Sexually transmitted disease (STD) testing.  Diabetes screening. This is done by checking your blood sugar (glucose) after you have not eaten for a while (fasting). You may have this done every 1-3 years.  Bone density scan. This is done to screen for osteoporosis. You may have this done starting at age 60.  Mammogram. This may be done every 1-2 years. Talk to your health care provider about how often you should have regular mammograms. Talk with your health care provider about your test results, treatment options, and if necessary, the need for more tests. Vaccines  Your health care provider may recommend  certain vaccines, such as:  Influenza vaccine. This is recommended every year.  Tetanus, diphtheria, and acellular pertussis (Tdap, Td) vaccine. You may need a Td booster every 10 years.  Zoster vaccine. You may need this after age 102.  Pneumococcal 13-valent conjugate (PCV13) vaccine. One dose is recommended after age 64.  Pneumococcal polysaccharide (PPSV23) vaccine. One dose is recommended after age 11. Talk to your health care provider about which screenings and vaccines you need and how often you need them. This information is not intended to replace advice given to you by your health care provider. Make sure you discuss any questions you have with your health care provider. Document Released: 05/16/2015 Document Revised: 01/07/2016 Document Reviewed: 02/18/2015 Elsevier Interactive Patient Education  2017 [place] Prevention in the Home Falls can cause injuries. They can happen to people of all ages. There are many things you can do to make your home safe and to help prevent falls. What can I do on the outside of my home?  Regularly fix the edges of walkways and driveways and fix any cracks.  Remove anything that might make you trip as you walk through a door, such as a raised step or threshold.  Trim any bushes or trees on the path to your home.  Use bright outdoor lighting.  Clear any walking paths of anything that might make someone trip, such as rocks or tools.  Regularly check to see if handrails are loose or broken. Make sure that both sides of any steps have handrails.  Any raised decks and porches should have guardrails on the edges.  Have any leaves, snow, or ice cleared regularly.  Use sand or salt on walking paths during winter.  Clean up any spills in your garage right away. This includes oil or grease spills. What can I do in the bathroom?  Use night lights.  Install grab bars by the toilet and in the tub and shower. Do not use towel bars as  grab bars.  Use non-skid mats or decals in the tub or shower.  If you need to sit down in the shower, use a plastic, non-slip stool.  Keep the floor dry. Clean up any water that spills on the floor as soon as it happens.  Remove soap buildup in the tub or shower regularly.  Attach bath mats securely with double-sided non-slip rug tape.  Do not have throw rugs and other things on the floor that can make you trip. What can I do in the bedroom?  Use night lights.  Make sure that you have a light by your bed that is easy to reach.  Do not use any sheets or blankets that are too big for your bed. They should not hang down onto the floor.  Have a firm chair that has side arms. You can use this for support while you get dressed.  Do not have throw rugs and other things on the floor that can make you trip. What can I do in the kitchen?  Clean up any spills right away.  Avoid walking on wet floors.  Keep items that you use a lot in easy-to-reach places.  If you need to reach  something above you, use a strong step stool that has a grab bar.  Keep electrical cords out of the way.  Do not use floor polish or wax that makes floors slippery. If you must use wax, use non-skid floor wax.  Do not have throw rugs and other things on the floor that can make you trip. What can I do with my stairs?  Do not leave any items on the stairs.  Make sure that there are handrails on both sides of the stairs and use them. Fix handrails that are broken or loose. Make sure that handrails are as long as the stairways.  Check any carpeting to make sure that it is firmly attached to the stairs. Fix any carpet that is loose or worn.  Avoid having throw rugs at the top or bottom of the stairs. If you do have throw rugs, attach them to the floor with carpet tape.  Make sure that you have a light switch at the top of the stairs and the bottom of the stairs. If you do not have them, ask someone to add them  for you. What else can I do to help prevent falls?  Wear shoes that:  Do not have high heels.  Have rubber bottoms.  Are comfortable and fit you well.  Are closed at the toe. Do not wear sandals.  If you use a stepladder:  Make sure that it is fully opened. Do not climb a closed stepladder.  Make sure that both sides of the stepladder are locked into place.  Ask someone to hold it for you, if possible.  Clearly mark and make sure that you can see:  Any grab bars or handrails.  First and last steps.  Where the edge of each step is.  Use tools that help you move around (mobility aids) if they are needed. These include:  Canes.  Walkers.  Scooters.  Crutches.  Turn on the lights when you go into a dark area. Replace any light bulbs as soon as they burn out.  Set up your furniture so you have a clear path. Avoid moving your furniture around.  If any of your floors are uneven, fix them.  If there are any pets around you, be aware of where they are.  Review your medicines with your doctor. Some medicines can make you feel dizzy. This can increase your chance of falling. Ask your doctor what other things that you can do to help prevent falls. This information is not intended to replace advice given to you by your health care provider. Make sure you discuss any questions you have with your health care provider. Document Released: 02/13/2009 Document Revised: 09/25/2015 Document Reviewed: 05/24/2014 Elsevier Interactive Patient Education  2017 [employer].

## 2017-05-17 NOTE — Progress Notes (Signed)
Subjective:   [name] [name] is a 67 y.o. female who presents for Medicare Annual (Subsequent) preventive examination.  Review of Systems:  N/A Cardiac Risk Factors include: advanced age (>69men, >54 women);dyslipidemia;hypertension;obesity (BMI >30kg/m2)     Objective:     Vitals: BP 108/76   Pulse 71   Ht 5\' 5"  (1.651 m)   Wt 261 lb (118.4 kg)   SpO2 96%   BMI 43.43 kg/m   Body mass index is 43.43 kg/m.  Advanced Directives 05/17/2017 09/25/2016 05/27/2015 03/14/2014 01/29/2014 01/26/2014 01/19/2014  Does Patient Have a Medical Advance Directive? No No No No No No No  Would patient like information on creating a medical advance directive? No - Patient declined No - Patient declined - - Yes - Transport planner given - -    Tobacco Social History   Tobacco Use  Smoking Status Former Smoker  . Packs/day: 0.50  . Years: 40.00  . Pack years: 20.00  . Types: Cigarettes  . Last attempt to quit: 07/09/2011  . Years since quitting: 5.8  Smokeless Tobacco Never Used     Counseling given: Not Answered   Clinical Intake:  Pre-visit preparation completed: Yes  Pain : 0-10 Pain Score: 5  Pain Type: Acute pain Pain Location: Foot Pain Orientation: Left Pain Descriptors / Indicators: Sore Pain Onset: 1 to 4 weeks ago Pain Frequency: Intermittent     Nutritional Status: BMI > 30  Obese Nutritional Risks: None Diabetes: No  How often do you need to have someone help you when you read instructions, pamphlets, or other written materials from your doctor or pharmacy?: 1 - Never What is the last grade level you completed in school?: Some College  Interpreter Needed?: No  Information entered by :: Janalyn Shy, LPN  Past Medical History:  Diagnosis Date  . Anxiety   . Cataract   . Chronic insomnia 10/13/2016  . Depression   . Gait disorder 03/14/2014  . HTN (hypertension)   . Hx of cardiovascular stress test    Lexiscan Myoview (1/16):  No ischemia, EF 68%,  breast atten; Low Risk  . Hyperlipemia   . Memory difficulty 03/14/2014  . Meningitis due to bacteria 01-18-14  . OSA (obstructive sleep apnea) 11/30/2016  . TIA (transient ischemic attack)    Past Surgical History:  Procedure Laterality Date  . NO PAST SURGERIES     Family History  Problem Relation Age of Onset  . Heart attack Mother 68  . Stroke Mother   . Hypertension Mother   . Heart attack Father 27  . Cerebral aneurysm Sister   . Hypertension Sister   . Kidney disease Sister        ESRD; HD in last year  . Hypertension Brother   . Hyperlipidemia Brother   . Rheum arthritis Sister   . Arthritis Sister   . Hypertension Brother   . Hyperlipidemia Brother   . Hyperlipidemia Brother   . Hypertension Brother    Social History   Socioeconomic History  . Marital status: Divorced    Spouse name: None  . Number of children: 1  . Years of education: college  . Highest education level: Some college, no degree  Social Needs  . Financial resource strain: Not hard at all  . Food insecurity - worry: Never true  . Food insecurity - inability: Never true  . Transportation needs - medical: Yes  . Transportation needs - non-medical: Yes  Occupational History  . Occupation: Retired [employer]  Employer: [employer]  Tobacco Use  . Smoking status: Former Smoker    Packs/day: 0.50    Years: 40.00    Pack years: 20.00    Types: Cigarettes    Last attempt to quit: 07/09/2011    Years since quitting: 5.8  . Smokeless tobacco: Never Used  Substance and Sexual Activity  . Alcohol use: Yes    Alcohol/week: 0.0 oz    Comment: Rare  . Drug use: No  . Sexual activity: No    Birth control/protection: Abstinence  Other Topics Concern  . None  Social History Narrative   Marital status: divorced x 35 years; not dating; not interested in 2018      Children: 1 child/son (44); 2 grandchildren; [place]lock      Lives:  Alone in [place] in [place].      Employment: retired 2008; Human resources officer.      Tobacco: quit smoking in 2013.  Smoked x many years       Alcohol: none; holidays only      Exercise: none; quit exercising two years ago in 2015.  Plans to join [employer]      Seatbelt: 100%; no texting. Drives locally       ADLs: drives locally; independent with ADLs.  Has cane for long distances      Advanced Directives:          Patient is single lives at home alone, has 1 child   Patient is right handed   Education level is 1 year of college   Caffeine consumption is 2 cups daily    Outpatient Encounter Medications as of 05/17/2017  Medication Sig  . aspirin 81 MG chewable tablet Chew 81 mg by mouth daily.  . carvedilol (COREG) 3.125 MG tablet TAKE 1 TABLET(3.125 MG) BY MOUTH TWICE DAILY WITH A MEAL  . levETIRAcetam (KEPPRA) 750 MG tablet Take 1 tablet (750 mg total) by mouth 2 (two) times daily.  Marland Kitchen lisinopril-hydrochlorothiazide (PRINZIDE,ZESTORETIC) 20-25 MG tablet Take 1 tablet by mouth daily.  . Multiple Vitamins-Minerals (MULTIVITAMIN PO) Take 1 tablet by mouth daily.  . simvastatin (ZOCOR) 20 MG tablet Take 1 tablet (20 mg total) by mouth daily.   No facility-administered encounter medications on file as of 05/17/2017.     Activities of Daily Living In your present state of health, do you have any difficulty performing the following activities: 05/17/2017 05/18/2016  Hearing? N Y  Comment - SOMETIMES LEFT EAR  Vision? Y N  Comment slight blurry vision at times -  Difficulty concentrating or making decisions? N Y  Walking or climbing stairs? N Y  Comment - PER PATIENT A LITTLE BECAUSE OF WEIGHT  Dressing or bathing? N N  Doing errands, shopping? N N  Preparing Food and eating ? N -  Using the Toilet? N -  In the past six months, have you accidently leaked urine? N -  Do you have problems with loss of bowel control? N -  Managing your Medications? N -  Managing your Finances? N -  Housekeeping or managing your Housekeeping? N -  Some recent  data might be hidden    Patient Care Team: Ethelda Chick, MD as PCP - General (Family Medicine) Cephus Richer, MD (Unknown Physician Specialty) Juluis Pitch, DDS (Dentistry) Huston Foley, MD as Attending Physician (Neurology) York Spaniel, MD as Consulting Physician (Neurology)    Assessment:   This is a routine wellness examination for [name] [name].  Exercise Activities and Dietary  recommendations Current Exercise Habits: The patient does not participate in regular exercise at present, Exercise limited by: None identified  Goals    . Exercise 3x per week (30 min per time)     Patient states that she wants to try to start exercising more on a daily basis       Fall Risk Fall Risk  05/17/2017 11/17/2016 08/26/2016 05/18/2016 10/28/2015  Falls in the past year? No Yes No Yes Yes  Number falls in past yr: - 1 - 1 1  Injury with Fall? - No - No No   Is the patient's home free of loose throw rugs in walkways, pet beds, electrical cords, etc?   yes      Grab bars in the bathroom? yes      Handrails on the stairs?   yes      Adequate lighting?   yes  Timed Get Up and Go performed: yes, completed within 30 seconds  Depression Screen PHQ 2/9 Scores 05/17/2017 11/17/2016 08/26/2016 05/18/2016  PHQ - 2 Score 0 0 0 0  PHQ- 9 Score - - - -     Cognitive Function MMSE - Mini Mental State Exam 08/26/2016 07/26/2014  Orientation to time 5 5  Orientation to Place 5 5  Registration 3 3  Attention/ Calculation 5 5  Recall 3 3  Language- name 2 objects 2 2  Language- repeat 1 1  Language- follow 3 step command 3 3  Language- read & follow direction 1 1  Write a sentence 1 1  Copy design 1 1  Total score 30 30     6CIT Screen 05/17/2017  What Year? 0 points  What month? 0 points  What time? 0 points  Count back from 20 0 points  Months in reverse 0 points  Repeat phrase 2 points  Total Score 2    Immunization History  Administered Date(s) Administered  . Influenza-Unspecified  02/20/2013, 01/18/2014, 01/09/2016, 02/15/2017  . Meningococcal Conjugate 10/23/2014  . Pneumococcal Conjugate-13 04/17/2014  . Pneumococcal Polysaccharide-23 04/18/2013  . Tdap 03/08/2007  . Zoster 05/03/2010    Qualifies for Shingles Vaccine: Advised patient to check with your pharmacy about receiving the Shingrix vaccine.   Screening Tests Health Maintenance  Topic Date Due  . DEXA SCAN  03/21/2016  . TETANUS/TDAP  03/07/2017  . PNA vac Low Risk Adult (2 of 2 - PPSV23) 04/18/2018  . MAMMOGRAM  06/15/2018  . COLONOSCOPY  06/09/2025  . INFLUENZA VACCINE  Completed  . Hepatitis C Screening  Completed    Cancer Screenings: Lung: Low Dose CT Chest recommended if Age 53-80 years, 30 pack-year currently smoking OR have quit w/in 15years. Patient does not qualify. Breast:  Up to date on Mammogram? Yes, completed 06/15/2016  Up to date of Bone Density/Dexa? No, Order sent over to [facility]. They will contact patient to setup.  Colorectal: colonoscopy completed 06/10/2015  Additional Screenings:  Hepatitis B/HIV/Syphillis: not indicated  Hepatitis C Screening: completed 04/23/2015  Patient declined tetanus vaccine at this time due to insurance.     Plan:   I have personally reviewed and noted the following in the patient's chart:   . Medical and social history . Use of alcohol, tobacco or illicit drugs  . Current medications and supplements . Functional ability and status . Nutritional status . Physical activity . Advanced directives . List of other physicians . Hospitalizations, surgeries, and ER visits in previous 12 months . Vitals . Screenings to include cognitive, depression,  and falls . Referrals and appointments  In addition, I have reviewed and discussed with patient certain preventive protocols, quality metrics, and best practice recommendations. A written personalized care plan for preventive services as well as general preventive health recommendations were  provided to patient.     Janalyn Shy, LPN  08/09/8117

## 2017-05-19 ENCOUNTER — Other Ambulatory Visit: Payer: Self-pay | Admitting: Family Medicine

## 2017-05-19 DIAGNOSIS — N6320 Unspecified lump in the left breast, unspecified quadrant: Secondary | ICD-10-CM

## 2017-05-23 ENCOUNTER — Ambulatory Visit (INDEPENDENT_AMBULATORY_CARE_PROVIDER_SITE_OTHER): Payer: Medicare Other | Admitting: Family Medicine

## 2017-05-23 ENCOUNTER — Other Ambulatory Visit: Payer: Self-pay

## 2017-05-23 ENCOUNTER — Encounter: Payer: Self-pay | Admitting: Family Medicine

## 2017-05-23 VITALS — BP 128/78 | HR 81 | Temp 98.2°F | Resp 16 | Ht 66.54 in | Wt 264.0 lb

## 2017-05-23 DIAGNOSIS — R413 Other amnesia: Secondary | ICD-10-CM | POA: Diagnosis not present

## 2017-05-23 DIAGNOSIS — D709 Neutropenia, unspecified: Secondary | ICD-10-CM

## 2017-05-23 DIAGNOSIS — G4733 Obstructive sleep apnea (adult) (pediatric): Secondary | ICD-10-CM

## 2017-05-23 DIAGNOSIS — E7800 Pure hypercholesterolemia, unspecified: Secondary | ICD-10-CM

## 2017-05-23 DIAGNOSIS — R5381 Other malaise: Secondary | ICD-10-CM

## 2017-05-23 DIAGNOSIS — I10 Essential (primary) hypertension: Secondary | ICD-10-CM | POA: Diagnosis not present

## 2017-05-23 DIAGNOSIS — R739 Hyperglycemia, unspecified: Secondary | ICD-10-CM | POA: Diagnosis not present

## 2017-05-23 DIAGNOSIS — F329 Major depressive disorder, single episode, unspecified: Secondary | ICD-10-CM | POA: Diagnosis not present

## 2017-05-23 DIAGNOSIS — R404 Transient alteration of awareness: Secondary | ICD-10-CM | POA: Diagnosis not present

## 2017-05-23 DIAGNOSIS — Z0000 Encounter for general adult medical examination without abnormal findings: Secondary | ICD-10-CM

## 2017-05-23 DIAGNOSIS — F5104 Psychophysiologic insomnia: Secondary | ICD-10-CM | POA: Diagnosis not present

## 2017-05-23 LAB — POCT URINALYSIS DIP (MANUAL ENTRY)
BILIRUBIN UA: NEGATIVE
BILIRUBIN UA: NEGATIVE mg/dL
Blood, UA: NEGATIVE
GLUCOSE UA: NEGATIVE mg/dL
LEUKOCYTES UA: NEGATIVE
Nitrite, UA: NEGATIVE
PH UA: 6 (ref 5.0–8.0)
Protein Ur, POC: NEGATIVE mg/dL
Spec Grav, UA: 1.02 (ref 1.010–1.025)
Urobilinogen, UA: 0.2 E.U./dL

## 2017-05-23 LAB — POCT GLYCOSYLATED HEMOGLOBIN (HGB A1C): HEMOGLOBIN A1C: 6.2

## 2017-05-23 MED ORDER — CARVEDILOL 3.125 MG PO TABS: ORAL_TABLET | ORAL | 1 refills | Status: DC

## 2017-05-23 MED ORDER — LISINOPRIL-HYDROCHLOROTHIAZIDE 20-25 MG PO TABS: 1.0000 | ORAL_TABLET | Freq: Every day | ORAL | 1 refills | Status: DC

## 2017-05-23 MED ORDER — SIMVASTATIN 20 MG PO TABS: 20.0000 mg | ORAL_TABLET | Freq: Every day | ORAL | 3 refills | Status: DC

## 2017-05-23 NOTE — Progress Notes (Signed)
Subjective:    Patient ID: [name] [name], female    DOB: 17-Oct-1950, 67 y.o.   MRN: [id number]  05/23/2017  Annual Exam    HPI This 67 y.o. female presents for Routine Physical Examination and for six month follow-up of hypertension, hypercholesterolemia, anxiety/depression.  Management changes made at last visit include:  -new onset transient memory loss and alterations of conciousness.  S/p neurology consultation.  Reviewed sleep study results with pt in detail during visit.  Recommend CPAP therapy and weight loss.  --recommend weight loss, exercise for 30-60 minutes five days per week; recommend 1200 kcal restriction per day with a minimum of 60 grams of protein per day. -will combine antihypertensive medications to decrease pill burden.  Last physical:  05-18-2016; AWV with LPN Calandra 6/57/84. Pap smear:  Age 71; 2018 Mammogram:  Scheduled mammogram. Colonoscopy:  2017 Bone density: scheduled in February    Visual Acuity Screening   Right eye Left eye Both eyes  Without correction: 20/20 20/20 20/20   With correction:       BP Readings from Last 3 Encounters:  05/23/17 128/78  05/17/17 108/76  01/19/17 111/64   Wt Readings from Last 3 Encounters:  05/23/17 264 lb (119.7 kg)  05/17/17 261 lb (118.4 kg)  01/19/17 259 lb 8 oz (117.7 kg)   Immunization History  Administered Date(s) Administered  . Influenza-Unspecified 02/20/2013, 01/18/2014, 01/09/2016, 02/15/2017  . Meningococcal Conjugate 10/23/2014  . Pneumococcal Conjugate-13 04/17/2014  . Pneumococcal Polysaccharide-23 04/18/2013  . Tdap 03/08/2007  . Zoster 05/03/2010   Health Maintenance  Topic Date Due  . DEXA SCAN  03/21/2016  . TETANUS/TDAP  03/07/2017  . PNA vac Low Risk Adult (2 of 2 - PPSV23) 04/18/2018  . MAMMOGRAM  06/15/2018  . COLONOSCOPY  06/09/2025  . INFLUENZA VACCINE  Completed  . Hepatitis C Screening  Completed   Hypertension: Patient reports good compliance with medication, good  tolerance to medication, and good symptom control.    Hypercholesterolemia: Patient reports good compliance with medication, good tolerance to medication, and good symptom control.    Absent seizure: no driving for six months after event; Dr. Anne Hahn stated no driving; now no driving for six months; follow-up in February 2019.  Having absent seizures; thus, [place] not allowing to drive. Started Keppra.  Took transportation [place] this morning. Covers 24 trips per year.  Sister now lives across town.  S/p EEG on 09/30/16 which was normal.  Has staring episodes that last approximately one minute.  Very isolated with inability to drive.    OSA on CPAP: Patient reports good compliance with medication, good tolerance to medication, and good symptom control.  Less napping.  Feeling better.  Review of Systems  Constitutional: Negative for activity change, appetite change, chills, diaphoresis, fatigue, fever and unexpected weight change.  HENT: Negative for congestion, dental problem, drooling, ear discharge, ear pain, facial swelling, hearing loss, mouth sores, nosebleeds, postnasal drip, rhinorrhea, sinus pressure, sneezing, sore throat, tinnitus, trouble swallowing and voice change.   Eyes: Negative for photophobia, pain, discharge, redness, itching and visual disturbance.  Respiratory: Negative for apnea, cough, choking, chest tightness, shortness of breath, wheezing and stridor.   Cardiovascular: Negative for chest pain, palpitations and leg swelling.  Gastrointestinal: Negative for abdominal distention, abdominal pain, anal bleeding, blood in stool, constipation, diarrhea, nausea, rectal pain and vomiting.  Endocrine: Negative for cold intolerance, heat intolerance, polydipsia, polyphagia and polyuria.  Genitourinary: Negative for decreased urine volume, difficulty urinating, dyspareunia, dysuria, enuresis, flank pain, frequency, genital  sores, hematuria, menstrual problem, pelvic pain, urgency, vaginal  bleeding, vaginal discharge and vaginal pain.       Nocturia x multiple.  Urinary leakage NONE.  Musculoskeletal: Negative for arthralgias, back pain, gait problem, joint swelling, myalgias, neck pain and neck stiffness.  Skin: Negative for color change, pallor, rash and wound.  Allergic/Immunologic: Negative for environmental allergies, food allergies and immunocompromised state.  Neurological: Negative for dizziness, tremors, seizures, syncope, facial asymmetry, speech difficulty, weakness, light-headedness, numbness and headaches.  Hematological: Negative for adenopathy. Does not bruise/bleed easily.  Psychiatric/Behavioral: Negative for agitation, behavioral problems, confusion, decreased concentration, dysphoric mood, hallucinations, self-injury, sleep disturbance and suicidal ideas. The patient is not nervous/anxious and is not hyperactive.        Bedtime 11-12; wakes up 1000.    Past Medical History:  Diagnosis Date  . Anxiety   . Cataract   . Chronic insomnia 10/13/2016  . Depression   . Gait disorder 03/14/2014  . HTN (hypertension)   . Hx of cardiovascular stress test    Lexiscan Myoview (1/16):  No ischemia, EF 68%, breast atten; Low Risk  . Hyperlipemia   . Memory difficulty 03/14/2014  . Meningitis due to bacteria 01-18-14  . OSA (obstructive sleep apnea) 11/30/2016  . TIA (transient ischemic attack)    Past Surgical History:  Procedure Laterality Date  . NO PAST SURGERIES     No Known Allergies Current Outpatient Medications on File Prior to Visit  Medication Sig Dispense Refill  . aspirin 81 MG chewable tablet Chew 81 mg by mouth daily.    Marland Kitchen levETIRAcetam (KEPPRA) 750 MG tablet Take 1 tablet (750 mg total) by mouth 2 (two) times daily. 60 tablet 5  . Multiple Vitamins-Minerals (MULTIVITAMIN PO) Take 1 tablet by mouth daily.     No current facility-administered medications on file prior to visit.    Social History   Socioeconomic History  . Marital status:  Divorced    Spouse name: Not on file  . Number of children: 1  . Years of education: college  . Highest education level: Some college, no degree  Social Needs  . Financial resource strain: Not hard at all  . Food insecurity - worry: Never true  . Food insecurity - inability: Never true  . Transportation needs - medical: Yes  . Transportation needs - non-medical: Yes  Occupational History  . Occupation: Retired Financial planner: [employer]  Tobacco Use  . Smoking status: Former Smoker    Packs/day: 0.50    Years: 40.00    Pack years: 20.00    Types: Cigarettes    Last attempt to quit: 07/09/2011    Years since quitting: 5.8  . Smokeless tobacco: Never Used  Substance and Sexual Activity  . Alcohol use: Yes    Alcohol/week: 0.0 oz    Comment: Rare  . Drug use: No  . Sexual activity: No    Birth control/protection: Abstinence  Other Topics Concern  . Not on file  Social History Narrative   Marital status: divorced x 35 years; not dating; not interested in 2019.      Children: 1 child/son (44); 2 grandchildren; [place]lock      Lives:  Alone in [place] in [place].      Employment: retired 2008; Administrator, arts.      Tobacco: quit smoking in 2013.  Smoked x many years       Alcohol: none; holidays only      Exercise: none; quit exercising  two years ago in 2015.        Seatbelt: 100%; no texting. Does not drive in 1610 due to seizure activity.       ADLs: no driving in 9604-5409 due to seizure activity; independent with ADLs.  Has cane for long distances      Advanced Directives: none; [name]/sister.  FULL CODE no prolonged measures.         Patient is single lives at home alone, has 1 child   Patient is right handed   Education level is 1 year of college   Caffeine consumption is 2 cups daily   Family History  Problem Relation Age of Onset  . Heart attack Mother 60  . Stroke Mother   . Hypertension Mother   . Heart attack Father 13  . Cerebral  aneurysm Sister   . Hypertension Sister   . Kidney disease Sister        ESRD; HD in last year  . Hypertension Brother   . Hyperlipidemia Brother   . Rheum arthritis Sister   . Arthritis Sister   . Hypertension Brother   . Hyperlipidemia Brother   . Hyperlipidemia Brother   . Hypertension Brother        Objective:    BP 128/78   Pulse 81   Temp 98.2 F (36.8 C) (Oral)   Resp 16   Ht 5' 6.54" (1.69 m)   Wt 264 lb (119.7 kg)   SpO2 97%   BMI 41.93 kg/m  Physical Exam  Constitutional: She is oriented to person, place, and time. She appears well-developed and well-nourished. No distress.  HENT:  Head: Normocephalic and atraumatic.  Right Ear: External ear normal.  Left Ear: External ear normal.  Nose: Nose normal.  Mouth/Throat: Oropharynx is clear and moist.  Eyes: Conjunctivae and EOM are normal. Pupils are equal, round, and reactive to light.  Neck: Normal range of motion and full passive range of motion without pain. Neck supple. No JVD present. Carotid bruit is not present. No thyromegaly present.  Cardiovascular: Normal rate, regular rhythm and normal heart sounds. Exam reveals no gallop and no friction rub.  No murmur heard. Pulmonary/Chest: Effort normal and breath sounds normal. She has no wheezes. She has no rales. Right breast exhibits no inverted nipple, no mass, no nipple discharge, no skin change and no tenderness. Left breast exhibits no inverted nipple, no mass, no nipple discharge, no skin change and no tenderness. Breasts are symmetrical.  Abdominal: Soft. Bowel sounds are normal. She exhibits no distension and no mass. There is no tenderness. There is no rebound and no guarding.  Musculoskeletal:       Right shoulder: Normal.       Left shoulder: Normal.       Cervical back: Normal.  Lymphadenopathy:    She has no cervical adenopathy.  Neurological: She is alert and oriented to person, place, and time. She has normal reflexes. No cranial nerve deficit.  She exhibits normal muscle tone. Coordination normal.  Skin: Skin is warm and dry. No rash noted. She is not diaphoretic. No erythema. No pallor.  Psychiatric: She has a normal mood and affect. Her behavior is normal. Judgment and thought content normal.  Nursing note and vitals reviewed.  No results found. Depression screen [facility] 2/9 05/23/2017 05/17/2017 11/17/2016 08/26/2016 05/18/2016  Decreased Interest 0 0 0 0 0  Down, Depressed, Hopeless 0 0 0 0 0  PHQ - 2 Score 0 0 0 0 0  Altered sleeping - - - - -  Tired, decreased energy - - - - -  Change in appetite - - - - -  Feeling bad or failure about yourself  - - - - -  Trouble concentrating - - - - -  Moving slowly or fidgety/restless - - - - -  Suicidal thoughts - - - - -  PHQ-9 Score - - - - -   Fall Risk  05/23/2017 05/17/2017 11/17/2016 08/26/2016 05/18/2016  Falls in the past year? No No Yes No Yes  Number falls in past yr: - - 1 - 1  Injury with Fall? - - No - No        Assessment & Plan:   1. Routine physical examination   2. Pure hypercholesterolemia   3. Memory difficulty   4. Morbid obesity (HCC)   5. Chronic neutropenia (HCC)   6. Chronic insomnia   7. Depression, reactive   8. Hyperglycemia   9. Essential hypertension   10. OSA (obstructive sleep apnea)   11. Episodic altered awareness   12. Debility     -anticipatory guidance provided --- exercise, weight loss, safe driving practices, aspirin 81mg  daily. -obtain age appropriate screening labs and labs for chronic disease management. -moderate fall risk; mild depression but stable; no evidence of hearing loss.  Discussed advanced directives and living will; also discussed end of life issues including code status.  -New onset obstructive sleep apnea and tolerating CPAP well.  Symptomatically improved and suffering with less daytime hypersomnolence.  Extremely compliant with CPAP.  Followed by Dr. Frances Furbish.  Status post neurology consultation -episodes of altered awareness.   Status post MRI of the brain and EEG; no epileptic activity identified on EEG.  Suffers with staring spells that last approximately 1 minute.  Empirically treated with Keppra For absent Seizures by Dr. Anne Hahn.  Currently Unable to Drive for the Next 6 Months. -Hypertension and hypercholesterolemia well controlled.  Refills of medications provided.   -For hyperglycemia, obtain hemoglobin A1c.  I recommend weight loss, exercise, and low-carbohydrate low-sugar food choices. You should AVOID: regular sodas, sweetened tea, fruit juices.  You should LIMIT: breads, pastas, rice, potatoes, and desserts/sweets.  I would recommend limiting your total carbohydrate intake per meal to 45 grams; I would limit your total carbohydrate intake per snack to 30 grams.  I would also have a goal of 60 grams of protein intake per day; this would equal 10-15 grams of protein per meal and 5-10 grams of protein per snack. --recommend weight loss, exercise for 30-60 minutes five days per week; recommend 1200 kcal restriction per day with a minimum of 60 grams of protein per day.     Orders Placed This Encounter  Procedures  . POCT glycosylated hemoglobin (Hb A1C)  . POCT urinalysis dipstick   Meds ordered this encounter  Medications  . carvedilol (COREG) 3.125 MG tablet    Sig: TAKE 1 TABLET(3.125 MG) BY MOUTH TWICE DAILY WITH A MEAL    Dispense:  180 tablet    Refill:  1  . lisinopril-hydrochlorothiazide (PRINZIDE,ZESTORETIC) 20-25 MG tablet    Sig: Take 1 tablet by mouth daily.    Dispense:  90 tablet    Refill:  1  . simvastatin (ZOCOR) 20 MG tablet    Sig: Take 1 tablet (20 mg total) by mouth daily.    Dispense:  90 tablet    Refill:  3    Return in about 6 months (around 11/20/2017) for follow-up chronic  medical conditions.   Aireonna Bauer Paulita Fujita, M.D. Primary [facility] at [facility] previously [facility] [address] [place], [place]  [address] [phone] phone [phone]  fax

## 2017-05-23 NOTE — Patient Instructions (Addendum)
   IF you received an x-ray today, you will receive an invoice from [facility]. Please contact [facility] at [phone] with questions or concerns regarding your invoice.   IF you received labwork today, you will receive an invoice from [facility]. Please contact [facility] at [phone] with questions or concerns regarding your invoice.   Our billing staff will not be able to assist you with questions regarding bills from these companies.  You will be contacted with the lab results as soon as they are available. The fastest way to get your results is to activate your My Chart account. Instructions are located on the last page of this paperwork. If you have not heard from us regarding the results in 2 weeks, please contact this office.      Calorie Counting for Weight Loss Calories are units of energy. Your body needs a certain amount of calories from food to keep you going throughout the day. When you eat more calories than your body needs, your body stores the extra calories as fat. When you eat fewer calories than your body needs, your body burns fat to get the energy it needs. Calorie counting means keeping track of how many calories you eat and drink each day. Calorie counting can be helpful if you need to lose weight. If you make sure to eat fewer calories than your body needs, you should lose weight. Ask your health care provider what a healthy weight is for you. For calorie counting to work, you will need to eat the right number of calories in a day in order to lose a healthy amount of weight per week. A dietitian can help you determine how many calories you need in a day and will give you suggestions on how to reach your calorie goal.  A healthy amount of weight to lose per week is usually 1-2 lb (0.5-0.9 kg). This usually means that your daily calorie intake should be reduced by 500-750 calories.  Eating 1,200 - 1,500 calories per day can help most women lose  weight.  Eating 1,500 - 1,800 calories per day can help most men lose weight.  What is my plan? My goal is to have __________ calories per day. If I have this many calories per day, I should lose around __________ pounds per week. What do I need to know about calorie counting? In order to meet your daily calorie goal, you will need to:  Find out how many calories are in each food you would like to eat. Try to do this before you eat.  Decide how much of the food you plan to eat.  Write down what you ate and how many calories it had. Doing this is called keeping a food log.  To successfully lose weight, it is important to balance calorie counting with a healthy lifestyle that includes regular activity. Aim for 150 minutes of moderate exercise (such as walking) or 75 minutes of vigorous exercise (such as running) each week. Where do I find calorie information?  The number of calories in a food can be found on a Nutrition Facts label. If a food does not have a Nutrition Facts label, try to look up the calories online or ask your dietitian for help. Remember that calories are listed per serving. If you choose to have more than one serving of a food, you will have to multiply the calories per serving by the amount of servings you plan to eat. For example, the label on a   package of bread might say that a serving size is 1 slice and that there are 90 calories in a serving. If you eat 1 slice, you will have eaten 90 calories. If you eat 2 slices, you will have eaten 180 calories. How do I keep a food log? Immediately after each meal, record the following information in your food log:  What you ate. Don't forget to include toppings, sauces, and other extras on the food.  How much you ate. This can be measured in cups, ounces, or number of items.  How many calories each food and drink had.  The total number of calories in the meal.  Keep your food log near you, such as in a small notebook in  your pocket, or use a mobile app or website. Some programs will calculate calories for you and show you how many calories you have left for the day to meet your goal. What are some calorie counting tips?  Use your calories on foods and drinks that will fill you up and not leave you hungry: ? Some examples of foods that fill you up are nuts and nut butters, vegetables, lean proteins, and high-fiber foods like whole grains. High-fiber foods are foods with more than 5 g fiber per serving. ? Drinks such as sodas, specialty coffee drinks, alcohol, and juices have a lot of calories, yet do not fill you up.  Eat nutritious foods and avoid empty calories. Empty calories are calories you get from foods or beverages that do not have many vitamins or protein, such as candy, sweets, and soda. It is better to have a nutritious high-calorie food (such as an avocado) than a food with few nutrients (such as a bag of chips).  Know how many calories are in the foods you eat most often. This will help you calculate calorie counts faster.  Pay attention to calories in drinks. Low-calorie drinks include water and unsweetened drinks.  Pay attention to nutrition labels for "low fat" or "fat free" foods. These foods sometimes have the same amount of calories or more calories than the full fat versions. They also often have added sugar, starch, or salt, to make up for flavor that was removed with the fat.  Find a way of tracking calories that works for you. Get creative. Try different apps or programs if writing down calories does not work for you. What are some portion control tips?  Know how many calories are in a serving. This will help you know how many servings of a certain food you can have.  Use a measuring cup to measure serving sizes. You could also try weighing out portions on a kitchen scale. With time, you will be able to estimate serving sizes for some foods.  Take some time to put servings of different  foods on your favorite plates, bowls, and cups so you know what a serving looks like.  Try not to eat straight from a bag or box. Doing this can lead to overeating. Put the amount you would like to eat in a cup or on a plate to make sure you are eating the right portion.  Use smaller plates, glasses, and bowls to prevent overeating.  Try not to multitask (for example, watch TV or use your computer) while eating. If it is time to eat, sit down at a table and enjoy your food. This will help you to know when you are full. It will also help you to be aware of what you   are eating and how much you are eating. What are tips for following this plan? Reading food labels  Check the calorie count compared to the serving size. The serving size may be smaller than what you are used to eating.  Check the source of the calories. Make sure the food you are eating is high in vitamins and protein and low in saturated and trans fats. Shopping  Read nutrition labels while you shop. This will help you make healthy decisions before you decide to purchase your food.  Make a grocery list and stick to it. Cooking  Try to cook your favorite foods in a healthier way. For example, try baking instead of frying.  Use low-fat dairy products. Meal planning  Use more fruits and vegetables. Half of your plate should be fruits and vegetables.  Include lean proteins like poultry and fish. How do I count calories when eating out?  Ask for smaller portion sizes.  Consider sharing an entree and sides instead of getting your own entree.  If you get your own entree, eat only half. Ask for a box at the beginning of your meal and put the rest of your entree in it so you are not tempted to eat it.  If calories are listed on the menu, choose the lower calorie options.  Choose dishes that include vegetables, fruits, whole grains, low-fat dairy products, and lean protein.  Choose items that are boiled, broiled, grilled, or  steamed. Stay away from items that are buttered, battered, fried, or served with cream sauce. Items labeled "crispy" are usually fried, unless stated otherwise.  Choose water, low-fat milk, unsweetened iced tea, or other drinks without added sugar. If you want an alcoholic beverage, choose a lower calorie option such as a glass of wine or light beer.  Ask for dressings, sauces, and syrups on the side. These are usually high in calories, so you should limit the amount you eat.  If you want a salad, choose a garden salad and ask for grilled meats. Avoid extra toppings like bacon, cheese, or fried items. Ask for the dressing on the side, or ask for olive oil and vinegar or lemon to use as dressing.  Estimate how many servings of a food you are given. For example, a serving of cooked rice is  cup or about the size of half a baseball. Knowing serving sizes will help you be aware of how much food you are eating at restaurants. The list below tells you how big or small some common portion sizes are based on everyday objects: ? 1 oz-4 stacked dice. ? 3 oz-1 deck of cards. ? 1 tsp-1 die. ? 1 Tbsp- a ping-pong ball. ? 2 Tbsp-1 ping-pong ball. ?  cup- baseball. ? 1 cup-1 baseball. Summary  Calorie counting means keeping track of how many calories you eat and drink each day. If you eat fewer calories than your body needs, you should lose weight.  A healthy amount of weight to lose per week is usually 1-2 lb (0.5-0.9 kg). This usually means reducing your daily calorie intake by 500-750 calories.  The number of calories in a food can be found on a Nutrition Facts label. If a food does not have a Nutrition Facts label, try to look up the calories online or ask your dietitian for help.  Use your calories on foods and drinks that will fill you up, and not on foods and drinks that will leave you hungry.  Use smaller plates, glasses, and   bowls to prevent overeating. This information is not intended to  replace advice given to you by your health care provider. Make sure you discuss any questions you have with your health care provider. Document Released: 04/19/2005 Document Revised: 03/19/2016 Document Reviewed: 03/19/2016 Elsevier Interactive Patient Education  2018 [employer] Inc[employer]  

## 2017-05-24 ENCOUNTER — Encounter: Payer: Medicare Other | Admitting: Family Medicine

## 2017-05-27 DIAGNOSIS — G4733 Obstructive sleep apnea (adult) (pediatric): Secondary | ICD-10-CM | POA: Diagnosis not present

## 2017-06-15 ENCOUNTER — Other Ambulatory Visit: Payer: Self-pay | Admitting: Family Medicine

## 2017-06-16 ENCOUNTER — Ambulatory Visit
Admission: RE | Admit: 2017-06-16 | Discharge: 2017-06-16 | Disposition: A | Discharge status: Home / Self Care | Payer: Medicare Other | Source: Ambulatory Visit | Attending: Family Medicine | Admitting: Family Medicine

## 2017-06-16 DIAGNOSIS — R922 Inconclusive mammogram: Secondary | ICD-10-CM | POA: Diagnosis not present

## 2017-06-16 DIAGNOSIS — Z13820 Encounter for screening for osteoporosis: Secondary | ICD-10-CM | POA: Diagnosis not present

## 2017-06-16 DIAGNOSIS — N6320 Unspecified lump in the left breast, unspecified quadrant: Secondary | ICD-10-CM

## 2017-06-27 DIAGNOSIS — G4733 Obstructive sleep apnea (adult) (pediatric): Secondary | ICD-10-CM | POA: Diagnosis not present

## 2017-06-28 ENCOUNTER — Telehealth: Payer: Self-pay | Admitting: Family Medicine

## 2017-06-28 ENCOUNTER — Encounter: Payer: Self-pay | Admitting: Neurology

## 2017-06-28 ENCOUNTER — Ambulatory Visit: Payer: Medicare Other | Admitting: Neurology

## 2017-06-28 VITALS — BP 112/61 | HR 55 | Ht 66.5 in | Wt 259.5 lb

## 2017-06-28 DIAGNOSIS — R404 Transient alteration of awareness: Secondary | ICD-10-CM

## 2017-06-28 MED ORDER — LEVETIRACETAM 1000 MG PO TABS: 1000.0000 mg | ORAL_TABLET | Freq: Two times a day (BID) | ORAL | 5 refills | Status: DC

## 2017-06-28 NOTE — Progress Notes (Signed)
Reason for visit: Seizures  [name] [name] is an 67 y.o. female  History of present illness:  [name] [name] is a 67 year old right-handed black female with a history of episodes of staring events felt secondary to seizures.  The patient has been placed on Keppra taking 750 mg twice daily.  She is tolerating medication well.  She has had 2 such events that were witnessed since last seen, one in October and then one in December 2018.  The patient lives alone, if she has an event while she is by herself she may not know it.  The patient may have a brief staring spell and then may take about 2 minutes to fully recover her consciousness.  The patient does not fall or jerk or twitch.  She is on CPAP, she sleeps well at night.  She has a good energy level during the day.  She returns to the office today for an evaluation.  Past Medical History:  Diagnosis Date  . Anxiety   . Cataract   . Chronic insomnia 10/13/2016  . Depression   . Gait disorder 03/14/2014  . HTN (hypertension)   . Hx of cardiovascular stress test    Lexiscan Myoview (1/16):  No ischemia, EF 68%, breast atten; Low Risk  . Hyperlipemia   . Memory difficulty 03/14/2014  . Meningitis due to bacteria 01-18-14  . OSA (obstructive sleep apnea) 11/30/2016  . TIA (transient ischemic attack)     Past Surgical History:  Procedure Laterality Date  . NO PAST SURGERIES      Family History  Problem Relation Age of Onset  . Heart attack Mother 23  . Stroke Mother   . Hypertension Mother   . Heart attack Father 43  . Cerebral aneurysm Sister   . Hypertension Sister   . Kidney disease Sister        ESRD; HD in last year  . Hypertension Brother   . Hyperlipidemia Brother   . Rheum arthritis Sister   . Arthritis Sister   . Hypertension Brother   . Hyperlipidemia Brother   . Hyperlipidemia Brother   . Hypertension Brother     Social history:  reports that she quit smoking about 5 years ago. Her smoking use included  cigarettes. She has a 20.00 pack-year smoking history. she has never used smokeless tobacco. She reports that she drinks alcohol. She reports that she does not use drugs.   No Known Allergies  Medications:  Prior to Admission medications   Medication Sig Start Date End Date Taking? Authorizing Provider  aspirin 81 MG chewable tablet Chew 81 mg by mouth daily.   Yes [provider]  carvedilol (COREG) 3.125 MG tablet TAKE 1 TABLET(3.125 MG) BY MOUTH TWICE DAILY WITH A MEAL 05/23/17  Yes Ethelda Chick, MD  lisinopril-hydrochlorothiazide (PRINZIDE,ZESTORETIC) 20-25 MG tablet Take 1 tablet by mouth daily. 05/23/17  Yes Ethelda Chick, MD  Multiple Vitamins-Minerals (MULTIVITAMIN PO) Take 1 tablet by mouth daily.   Yes [provider]  simvastatin (ZOCOR) 20 MG tablet Take 1 tablet (20 mg total) by mouth daily. 05/23/17  Yes Ethelda Chick, MD  levETIRAcetam (KEPPRA) 1000 MG tablet Take 1 tablet (1,000 mg total) by mouth 2 (two) times daily. 06/28/17   York Spaniel, MD    ROS:  Out of a complete 14 system review of symptoms, the patient complains only of the following symptoms, and all other reviewed systems are negative.  Runny nose Eye itching, eye redness  Cough Itching  Blood pressure 112/61, pulse (!) 55, height 5' 6.5" (1.689 m), weight 259 lb 8 oz (117.7 kg).  Physical Exam  General: The patient is alert and cooperative at the time of the examination.  The patient is markedly obese.  Skin: No significant peripheral edema is noted.   Neurologic Exam  Mental status: The patient is alert and oriented x 3 at the time of the examination. The patient has apparent normal recent and remote memory, with an apparently normal attention span and concentration ability.   Cranial nerves: Facial symmetry is present. Speech is normal, no aphasia or dysarthria is noted. Extraocular movements are full. Visual fields are full.  Motor: The patient has good strength in all  4 extremities.  Sensory examination: Soft touch sensation is symmetric on the face, arms, and legs.  Coordination: The patient has good finger-nose-finger and heel-to-shin bilaterally.  Gait and station: The patient has a normal gait. Tandem gait is slightly unsteady. Romberg is negative. No drift is seen.  Reflexes: Deep tendon reflexes are symmetric.   Assessment/Plan:  1.  Probable seizure events  The patient is having episodes of staring off, unresponsiveness.  She has no recollection of the event.  The patient lives alone and may be having episodes that are unwitnessed.  She will go up on the Keppra taking 1000 mg twice daily, she currently is not operating a motor vehicle.  She will follow-up in 6 months, sooner if needed.  [name] Palau MD 06/28/2017 12:18 PM  [facility] [address] [place], [place] [address]  Phone [phone] Fax [phone]

## 2017-06-28 NOTE — Patient Instructions (Signed)
We will go up on the Keppra to 1000 mg twice a day.

## 2017-06-29 DIAGNOSIS — G4733 Obstructive sleep apnea (adult) (pediatric): Secondary | ICD-10-CM | POA: Diagnosis not present

## 2017-06-29 DIAGNOSIS — G009 Bacterial meningitis, unspecified: Secondary | ICD-10-CM | POA: Diagnosis not present

## 2017-06-29 NOTE — Telephone Encounter (Signed)
Looks like the Lexapro prescription has been discontinued by another provider not in the practice on 10/13/16. Provider :  Myrle Sheng. Katrinka Blazing, MD  LOV :  05/23/17 NOV:  11/23/17  Pharmacy :  [facility] [id number]  [address], [place], [place]  Please review.

## 2017-06-30 NOTE — Telephone Encounter (Signed)
Dr. Katrinka Blazing patient was seen in January, I see she is stable on medication.   Wasn't refilled at her visit.  Is patient still taking?

## 2017-07-05 NOTE — Telephone Encounter (Signed)
Patient is calling and states she would like to know if Lexapro is going to be refilled. She is not understanding why it was taken off of her list and is requesting a call back. Please advise.  [phone]

## 2017-07-05 NOTE — Telephone Encounter (Signed)
Medication discontinued at 10/13/16 with Neurologist. See A&P below:  " Assessment/Plan:  1. Episodes of transient loss of awareness  2. Sleep apnea  3. Chronic insomnia  The patient is having significant issues with insomnia. The episodes of staring off could represent seizures even though the EEG study was unremarkable. Sleep deprivation may be a significant activator for these events. The patient will be placed on trazodone, the dose will be adjusted according to her needs. She will stop the Lexapro. The patient will follow-up in 3 months. She will contact me for any dose adjustments. If episodes continue and the patient is sleeping better at night, we will add Keppra to the drug regimen as and empiric trial for treatment of seizures. The patient is not to operate a motor vehicle until further notice."  Provider, please advise.

## 2017-07-06 DIAGNOSIS — G4733 Obstructive sleep apnea (adult) (pediatric): Secondary | ICD-10-CM | POA: Diagnosis not present

## 2017-07-06 DIAGNOSIS — G009 Bacterial meningitis, unspecified: Secondary | ICD-10-CM | POA: Diagnosis not present

## 2017-07-07 ENCOUNTER — Telehealth: Payer: Self-pay | Admitting: Neurology

## 2017-07-07 NOTE — Telephone Encounter (Signed)
Pt calling in Call back [phone]  Pt called stating she needs to know if she is to continue lexapro. Pt will f/u with her neurologist as well. She states she has never gone off lexapro.

## 2017-07-07 NOTE — Telephone Encounter (Signed)
Pt requesting a call back to discuss lexapro stating she wasn't aware that Dr. Anne Hahn took her off of it back in June. Pt stating she has been taking still and just found out when asking for a refill, please call to discuss

## 2017-07-07 NOTE — Telephone Encounter (Signed)
The patient was taken off of Lexapro in June 2018 as she was being tried on trazodone for sleep, then she went to mirtazapine, she did not do well with either 1 of these medications.  She is not on another antidepressant medication at this time, as far as I am concerned it is okay to be on Lexapro, this was being written by Dr. Katrinka Blazing previously.  If the patient wishes me to write the prescription, I will be happy to do so.

## 2017-07-08 DIAGNOSIS — G009 Bacterial meningitis, unspecified: Secondary | ICD-10-CM | POA: Diagnosis not present

## 2017-07-08 DIAGNOSIS — G4733 Obstructive sleep apnea (adult) (pediatric): Secondary | ICD-10-CM | POA: Diagnosis not present

## 2017-07-09 MED ORDER — ESCITALOPRAM OXALATE 10 MG PO TABS: 10.0000 mg | ORAL_TABLET | Freq: Every day | ORAL | 1 refills | Status: DC

## 2017-07-09 NOTE — Addendum Note (Signed)
Addended by: Ethelda Chick on: 07/09/2017 04:14 PM   Modules accepted: Orders

## 2017-07-09 NOTE — Telephone Encounter (Signed)
Refill of Lexapro sent in.

## 2017-07-17 ENCOUNTER — Encounter: Payer: Self-pay | Admitting: Neurology

## 2017-07-19 ENCOUNTER — Encounter: Payer: Self-pay | Admitting: Neurology

## 2017-07-19 ENCOUNTER — Ambulatory Visit: Payer: Medicare Other | Admitting: Neurology

## 2017-07-19 VITALS — BP 118/82 | HR 60 | Ht 65.5 in | Wt 264.0 lb

## 2017-07-19 DIAGNOSIS — G4733 Obstructive sleep apnea (adult) (pediatric): Secondary | ICD-10-CM | POA: Diagnosis not present

## 2017-07-19 DIAGNOSIS — Z9989 Dependence on other enabling machines and devices: Secondary | ICD-10-CM

## 2017-07-19 NOTE — Patient Instructions (Signed)

## 2017-07-19 NOTE — Progress Notes (Signed)
Subjective:    Patient ID: [name] [name] is a 67 y.o. female.  HPI     Interim history:   [name] [name] is a 67 year old right-handed woman with an underlying medical history of anxiety, depression, hypertension, hyperlipidemia, history of meningitis in 2015, anemia, gait disorder, memory loss, prior long-standing history of smoking, and morbid obesity with a BMI of over 40, who presents for follow-up consultation of her obstructive sleep apnea. The patient is accompanied by her sister again today. I last saw her on 01/19/2017, at which time we talked about her sleep study results. She was compliant with CPAP therapy at the time and indicated good results. She had recently started Keppra per Dr. Jannifer Franklin and had some daytime somnolence from it. She was not driving at the time. She was advised to continue to be fully compliant with CPAP therapy and follow-up for sleep apnea routinely in 6 months with me.   She was seen in the interim by Dr. Jannifer Franklin on 06/28/2017 at which time she was advised to continue with Keppra and follow-up in 6 months.  Today, 07/19/2017: I reviewed her CPAP compliance data from 06/18/2017 through 07/17/2017 which is a total of 30 days, during which time she used her CPAP every night with percent used days greater than 4 hours at 100%, indicating superb compliance with an average usage of 7 hours and 36 minutes, residual AHI at goal at 0.8 per hour, leak acceptable with the 95th percentile at 7.1 L/m on a pressure of 14 cm. She reports doing very well with CPAP. She sleeps well with it. She has not had any recent illness, is taking Keppra thousand milligrams twice a day. Her last seizure-like event was in December. She is hoping to be able to resume driving by June of this year.    The patient's allergies, current medications, family history, past medical history, past social history, past surgical history and problem list were reviewed and updated as appropriate.    Previously  (copied from previous notes for reference):   I first met her on 09/28/2016 at the request of her primary care physician, at which time she reported recent spells of decreased awareness, snoring, daytime somnolence. She was advised to make a follow-up appointment with Dr. Jannifer Franklin who had previously followed her after her diagnosis of meningitis in 2015. I suggested we proceed with EEG testing as well as sleep study testing. She had a baseline sleep study, followed by a CPAP titration study. I went over her test results with her in detail today. Baseline sleep study from 10/05/2016 showed a sleep latency markedly delayed of 261 minutes and REM latency was also delayed, sleep efficiency was reduced at 51.1%. She had absence of slow-wave sleep and a normal percentage of REM sleep. Total AHI was highly elevated at 48.3 per hour, REM AHI was 110.9 per hour. Average oxygen saturation was 95%, nadir was 58%. She had mild PLMS with minimal arousals. Based on her symptoms and her test results I asked her to return for a full night CPAP titration study. She had this on 10/26/2016. Sleep efficiency was 86.2%, sleep latency 9.5 minutes and REM latency was 59 minutes. She had a normal percentage of REM sleep. She was fitted with nasal pillows. CPAP was titrated from 5 cm to 14 cm. On the final pressure, her AHI was 0 per hour with supine non-REM sleep achieved an O2 nadir was 93%. Based on her test results I prescribed CPAP therapy for home use.  She had an EEG on 09/30/2016 which was reported as normal in the awake and drowsy states.   I reviewed her CPAP compliance data from 12/19/2016 through 01/17/2017 which is a total of 30 days, during which time she used her CPAP every night with percent used days greater than 4 hours at 100%, indicating superb compliance with an average usage of 7 hours and 40 minutes, residual AHI low at 1.5 per hour, leak acceptable with the 95th percentile at 13.2 L/m on a pressure of 14 cm.     09/28/2016: (She) reports snoring and excessive daytime somnolence as well as recent spells of decreased consciousness.  Her Epworth sleepiness score is 11 out of 24 today, fatigue score is 42 out of 63. She is divorced and lives alone. She has 1 child. She quit smoking some 5 years ago and drinks alcohol occasionally in the form of one glass of wine typically. She drinks caffeine in the form of coffee, 2 cups daily. She reports spells of inattentiveness. She has had 2 spells some 30 days ago. She had a brain MRI w/wo contrast on 08/26/16, which I reviewed: IMPRESSION: 1. No acute intracranial infarct or other process identified. 2. Partially empty sella with prominent CSF within Meckel's cave bilaterally. This appearance can be seen in the setting of idiopathic intracranial hypertension (pseudotumor cerebri the), but could also reflect a normal anatomic variant. Correlation with CSF opening pressure could be performed for confirmatory purposes as clinically indicated. 3. Otherwise normal brain MRI. She had CTH wo contrast on 09/25/16, which I reviewed: IMPRESSION: No acute abnormality is noted. She presented to the emergency room on 09/25/2016 with a staring spell. She was referred to neurology at [facility]. She previously saw Dr. Jannifer Franklin in this office at the time of her meningitis, last visit in March 2016 with our nurse practitioner. She has gained weight in the past 3 years, almost 20 lb. Has been told, snoring is loud.  She has woken up with a sense of gasping. She has occasional morning headaches, has had RLS Sx.  She tosses and turns, has nocturia about 1-2 times per night. She naps occasionally. She has difficulty falling asleep and staying asleep, no sleep aid.  Her sister reports that she had spells of inattention and losing contact with reality in 2014. She had workup for this as I understand. She has no history of convulsions or trembling, shaking spells. The patient has no recollection of the  event during the time which last just a few minutes at a time. She has been told by the emergency room physician not to drive. The patient denies frank depression but endorses anxiety. She is tearful multiple times during the appointment. Her sister reports that she has noticed the patient is not very outgoing, stays alone a lot. They lost one sister last year. They were a total of 3 brothers and 4 sisters.  Her Past Medical History Is Significant For: Past Medical History:  Diagnosis Date  . Anxiety   . Cataract   . Chronic insomnia 10/13/2016  . Depression   . Gait disorder 03/14/2014  . HTN (hypertension)   . Hx of cardiovascular stress test    Lexiscan Myoview (1/16):  No ischemia, EF 68%, breast atten; Low Risk  . Hyperlipemia   . Memory difficulty 03/14/2014  . Meningitis due to bacteria 01-18-14  . OSA (obstructive sleep apnea) 11/30/2016  . TIA (transient ischemic attack)     Her Past Surgical History Is Significant For: Past  Surgical History:  Procedure Laterality Date  . NO PAST SURGERIES      Her Family History Is Significant For: Family History  Problem Relation Age of Onset  . Heart attack Mother 25  . Stroke Mother   . Hypertension Mother   . Heart attack Father 62  . Cerebral aneurysm Sister   . Hypertension Sister   . Kidney disease Sister        ESRD; HD in last year  . Hypertension Brother   . Hyperlipidemia Brother   . Rheum arthritis Sister   . Arthritis Sister   . Hypertension Brother   . Hyperlipidemia Brother   . Hyperlipidemia Brother   . Hypertension Brother     Her Social History Is Significant For: Social History   Socioeconomic History  . Marital status: Divorced    Spouse name: None  . Number of children: 1  . Years of education: college  . Highest education level: Some college, no degree  Social Needs  . Financial resource strain: Not hard at all  . Food insecurity - worry: Never true  . Food insecurity - inability: Never true  .  Transportation needs - medical: Yes  . Transportation needs - non-medical: Yes  Occupational History  . Occupation: Retired Scientist, research (physical sciences): [employer]  Tobacco Use  . Smoking status: Former Smoker    Packs/day: 0.50    Years: 40.00    Pack years: 20.00    Types: Cigarettes    Last attempt to quit: 07/09/2011    Years since quitting: 6.0  . Smokeless tobacco: Never Used  Substance and Sexual Activity  . Alcohol use: Yes    Alcohol/week: 0.0 oz    Comment: Rare  . Drug use: No  . Sexual activity: No    Birth control/protection: Abstinence  Other Topics Concern  . None  Social History Narrative   Marital status: divorced x 35 years; not dating; not interested in 2019.      Children: 1 child/son (44); 2 grandchildren; [place]      Lives:  Alone in [place] in [place].      Employment: retired 2008; Engineer, technical sales.      Tobacco: quit smoking in 2013.  Smoked x many years       Alcohol: none; holidays only      Exercise: none; quit exercising two years ago in 2015.        Seatbelt: 100%; no texting. Does not drive in [address] due to seizure activity.       ADLs: no driving in 7510[place]85 due to seizure activity; independent with ADLs.  Has cane for long distances      Advanced Directives: none; [name]/sister.  FULL CODE no prolonged measures.         Patient is single lives at home alone, has 1 child   Patient is right handed   Education level is 1 year of college   Caffeine consumption is 2 cups daily    Her Allergies Are:  No Known Allergies:   Her Current Medications Are:  Outpatient Encounter Medications as of 07/19/2017  Medication Sig  . aspirin 81 MG chewable tablet Chew 81 mg by mouth daily.  . carvedilol (COREG) 3.125 MG tablet TAKE 1 TABLET(3.125 MG) BY MOUTH TWICE DAILY WITH A MEAL  . escitalopram (LEXAPRO) 10 MG tablet Take 1 tablet (10 mg total) by mouth at bedtime.  . levETIRAcetam (KEPPRA) 1000 MG tablet Take 1 tablet (1,000 mg total)  by mouth  2 (two) times daily.  Marland Kitchen lisinopril-hydrochlorothiazide (PRINZIDE,ZESTORETIC) 20-25 MG tablet Take 1 tablet by mouth daily.  . Multiple Vitamins-Minerals (MULTIVITAMIN PO) Take 1 tablet by mouth daily.  . simvastatin (ZOCOR) 20 MG tablet Take 1 tablet (20 mg total) by mouth daily.   No facility-administered encounter medications on file as of 07/19/2017.   :  Review of Systems:  Out of a complete 14 point review of systems, all are reviewed and negative with the exception of these symptoms as listed below:   Review of Systems  Neurological:       Pt presents today to discuss her cpap. Pt reports that her cpap is going well.    Objective:  Neurological Exam  Physical Exam Physical Examination:   Vitals:   07/19/17 1115  BP: 118/82  Pulse: 60   General Examination: The patient is a very pleasant 67 y.o. female in no acute distress. She appears well-developed and well-nourished and well groomed. Good spirits.   HEENT:Normocephalic, atraumatic, pupils are equal, round and reactive to light and accommodation. Extraocular tracking is good without limitation to gaze excursion or nystagmus noted. Normal smooth pursuit is noted. Hearing is grossly intact. Face is symmetric with normal facial animation and normal facial sensation. Speech is clear with no dysarthria noted. There is no hypophonia. There is no lip, neck/head, jaw or voice tremor. Neck shows FROM. Oropharynx exam reveals: mildmouth dryness, adequatedental hygiene and moderateairway crowding. Tongue protrudes centrally and palate elevates symmetrically. Tonsils are 1+ in size.   Chest:Clear to auscultation without wheezing, rhonchi or crackles noted.  Heart:S1+S2+0, regular and normal without murmurs, rubs or gallops noted.   Abdomen:Soft, non-tender and non-distended.  Extremities:There is noobvious change.   Skin: Warm and dry without trophic changes noted.  Musculoskeletal: exam reveals no obvious joint  deformities, tenderness or joint swelling or erythema.   Neurologically:  Mental status: The patient is awake, alert and oriented in all 4 spheres. Herimmediate and remote memory, attention, language skills and fund of knowledge are appropriate. There is no evidence of aphasia, agnosia, apraxia or anomia. Speech is clear with normal prosody and enunciation. Thought process is linear. Mood is constrictedand affect is blunted.  Cranial nerves II - XII are as described above under HEENT exam. In addition: shoulder shrug is normal with equal shoulder height noted. Motor exam: Normal bulk, strength and tone is noted. There is no drift, tremor or rebound. Reflexes are 1+ in the upper extremities, trace in the lower extremities. Fine motor skills and coordination: grossly intact.  Cerebellar testing: No dysmetria or intention tremor. There is no truncal or gait ataxia.  Sensory exam: intact to light touch.  Gait, station and balance: Shestands easily. Posture is age-appropriate and stance is narrow based. Gait shows normalstride length and normalpace. No problems turning are noted.   Assessment and Plan:  In summary, IRMALEE RIEMENSCHNEIDER a very pleasant 67 year old female with an underlying medical history of anxiety, depression, hypertension, hyperlipidemia, history of meningitis in 2015, anemia, gait disorder, memory loss, prior long-standing history of smoking, seizure d/o, and obesity, whopresents for follow up consultation of her severe OSA. She is well established on CPAP therapy at a pressure of 14 cm with full compliance and ongoing good results. She is highly commended for her treatment adherence. She had a baseline sleep study in early June 2018 and a CPAP titration study in late June 2018. Her apnea is under very control, she is benefiting from treatment, indicating better sleep  consolidation, improved daytime somnolence. She has been on Keppra for seizure disorder, this was increased to 1000 mg  twice daily per Dr. Jannifer Franklin. From a sleep apnea standpoint she is doing very well. She is advised to follow-up in one year for this routinely, she can see one of our nurse practitioners. I answered all their questions today and the patient and her sister were in agreement.  I spent 25 minutes in total face-to-face time with the patient, more than 50% of which was spent in counseling and coordination of care, reviewing test results, reviewing medication and discussing or reviewing the diagnosis of OSA, its prognosis and treatment options. Pertinent laboratory and imaging test results that were available during this visit with the patient were reviewed by me and considered in my medical decision making (see chart for details).

## 2017-07-25 DIAGNOSIS — G4733 Obstructive sleep apnea (adult) (pediatric): Secondary | ICD-10-CM | POA: Diagnosis not present

## 2017-08-01 ENCOUNTER — Other Ambulatory Visit: Payer: Self-pay | Admitting: Family Medicine

## 2017-08-25 ENCOUNTER — Telehealth: Payer: Self-pay | Admitting: Neurology

## 2017-08-25 DIAGNOSIS — G4733 Obstructive sleep apnea (adult) (pediatric): Secondary | ICD-10-CM | POA: Diagnosis not present

## 2017-08-25 MED ORDER — LEVETIRACETAM 750 MG PO TABS
1500.0000 mg | ORAL_TABLET | Freq: Two times a day (BID) | ORAL | 3 refills | Status: DC
Start: 2017-08-25 — End: 2017-11-23

## 2017-08-25 NOTE — Telephone Encounter (Signed)
I called the patient.  The patient will go up on the Keppra taking 1500 mg twice daily.  She may use up to 1000 mg tablets by taking 3 a day.  She is not to drive a motor vehicle.

## 2017-08-25 NOTE — Addendum Note (Signed)
Addended by: York Spaniel on: 08/25/2017 05:40 PM   Modules accepted: Orders

## 2017-08-25 NOTE — Telephone Encounter (Signed)
Pt called stating that last night while at a restaurant she had her first seizure within the past 5 months. Pt states she feels it was due to the loud noises and high anxiety while there. Please call to advise

## 2017-08-25 NOTE — Telephone Encounter (Signed)
Called patient back. She had a seizure last night while at the restaurant with her sister and cousin. She states her anxiety does increase around them and unsure if this was a precipitating factor. Her episode lasted about 4 minutes per sister who captured episode on her phone. This was longest episode she has had. Verified she is taking keppra 1000mg  BID. Denied missing any doses. She has not started any new meds. She just filled this prescription again yesterday for qty 60. She is feeling okay today. Has not had any more episodes.   She does have another prescription for Keppra 750mg  tab form a previous rx, directions: 1 tablet po BID qty 180.   She is wondering if Dr. Anne Hahn wants to adjust her dose, how she should proceed. Advised I will send message to Dr. Anne Hahn to advise. She verbalized understanding.

## 2017-08-30 NOTE — Telephone Encounter (Signed)
Pt sister(on DPR) has called and wants to know how Dr Anne Hahn would like for her to bring the footage of pt having "an episode" she is asking for a call back

## 2017-08-30 NOTE — Telephone Encounter (Signed)
If the episode was recorded, I would like to see it, not sure how the patient is going to get this information to me.  It would be okay if she waited to her next revisit and showed me on the telephone.

## 2017-09-01 NOTE — Telephone Encounter (Signed)
I called and talk with the sister.  She will come in tomorrow at 7:30 AM, I will look at the video at that time.

## 2017-09-01 NOTE — Telephone Encounter (Signed)
Pt's sister returned providers call. States she can only save recording for 30 days. She said she is not far from the office and can come by anytime that would be convenient. Please call to advise at [phone]

## 2017-09-27 ENCOUNTER — Encounter: Payer: Self-pay | Admitting: Family Medicine

## 2017-10-07 ENCOUNTER — Telehealth: Payer: Self-pay | Admitting: Neurology

## 2017-10-07 MED ORDER — CARBAMAZEPINE 200 MG PO TABS: ORAL_TABLET | ORAL | 3 refills | Status: DC

## 2017-10-07 NOTE — Addendum Note (Signed)
Addended by: York Spaniel on: 10/07/2017 01:11 PM   Modules accepted: Orders

## 2017-10-07 NOTE — Telephone Encounter (Signed)
Called and spoke with patient. She was with her friend yesterday who witnessed seizure event. Her friend told her she went into a daze. Did not know her friend or her friends husband. Could not answer questions. Did not lose consciousness. Episode lasted about 2 minutes. She ended up going home and her friend called an hour later to check on her. At that time, she was able to remember one of the questions her friend was trying to ask her during seizure event.   Verified she is taking Keppra. She is using up 750mg  tablets taking 2 tablets twice daily.  However, there is some confusion it appears at the pharmacy. She states they recently gave her 1000mg  tablet to take TID.  She wants to know if Dr. Anne Hahn wants to adjust her dose or send her to specialist as previously discussed for further work up.

## 2017-10-07 NOTE — Telephone Encounter (Signed)
Pt requesting a call, stating she had a seizure yesterday 6/6 for about 2 minutes. Stating she couldn't assume any triggers, pt had been in a quite setting talking with a friend. Requesting a call back to discuss

## 2017-10-07 NOTE — Telephone Encounter (Signed)
I called the patient.  Patient had another brief phase out event while talking to someone.  She is on a maximum dose of Keppra, we will begin carbamazepine taking 1/2 tablet twice daily for 2 weeks and then go to 1 full tablet twice daily of the 200 mg tablets.

## 2017-10-10 NOTE — Telephone Encounter (Signed)
I called the patient.  The patient is to take the carbamazepine and the Keppra together.

## 2017-10-10 NOTE — Telephone Encounter (Signed)
Pt requesting a call back wanting to clarify that she is suppose to continue taking Keppra along with new medication.

## 2017-10-25 IMAGING — CT CT HEAD W/O CM
3 series · 16 of 47 positions shown, 19 images · non-contrast
Comparison: 08/26/2016

CLINICAL DATA: Unresponsiveness

EXAM:
CT HEAD WITHOUT CONTRAST
TECHNIQUE: Contiguous axial images were obtained from the base of the skull
through the vertex without intravenous contrast.

[Series 3: head 5.0 h30s · axial · 0.42mm/px · z∈[-81,+44]mm · 10 of 31 slices shown, 13 images]
[im 3/31  brain]
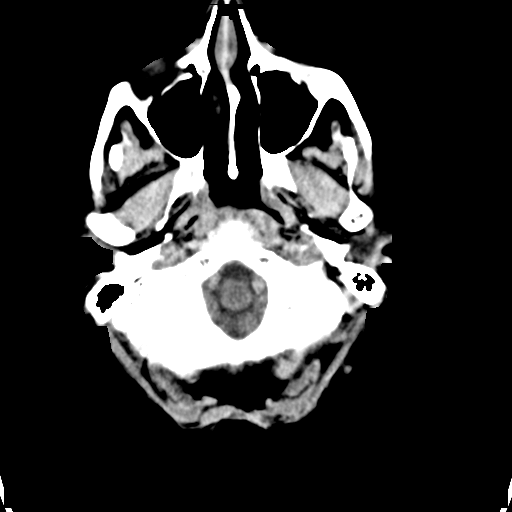
[im 3/31  bone]
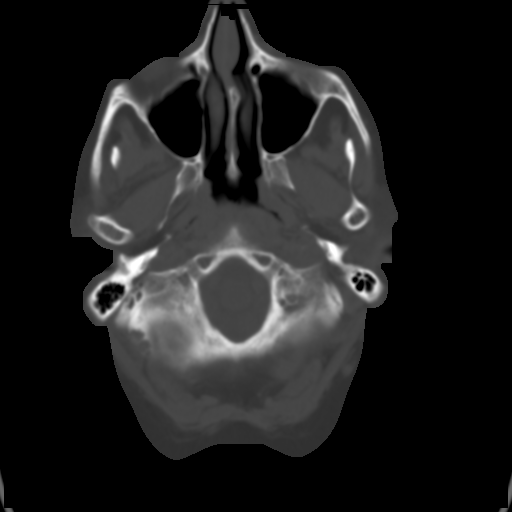
[im 6/31  brain]
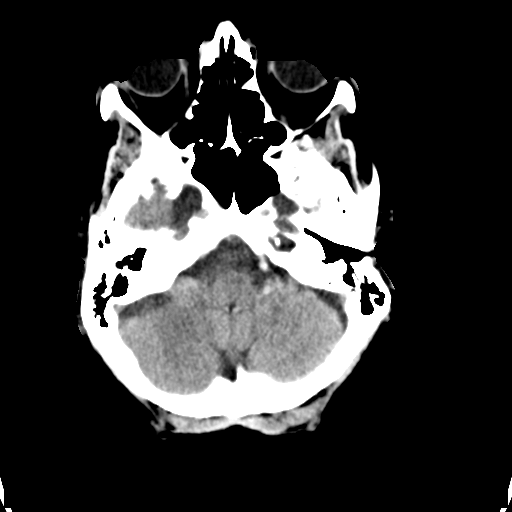
[im 9/31  brain]
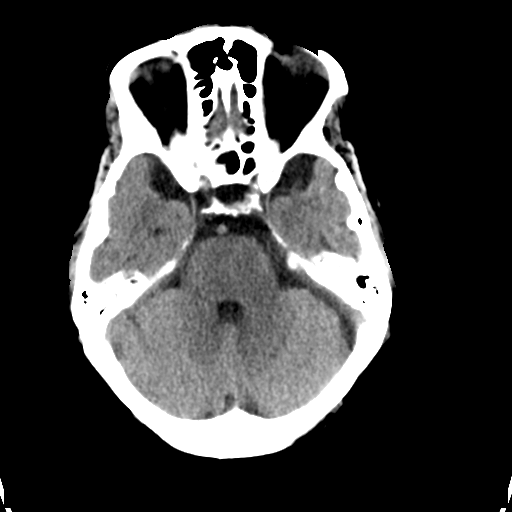
[im 11/31  brain]
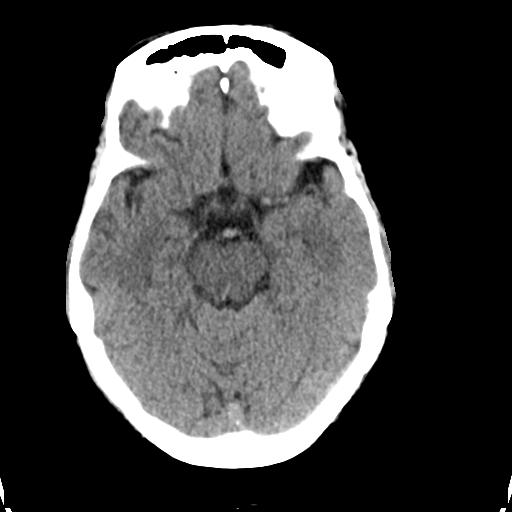
[im 14/31  brain]
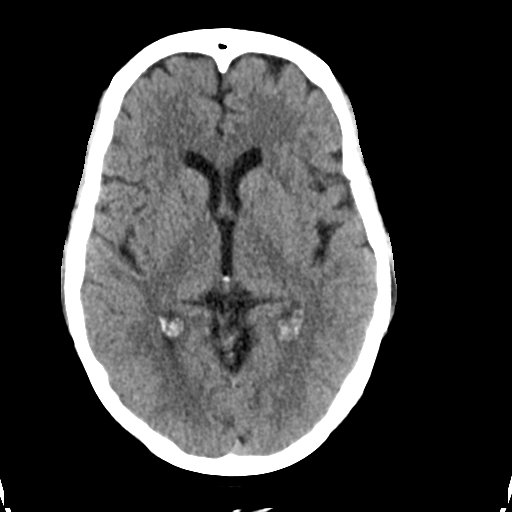
[im 14/31  bone]
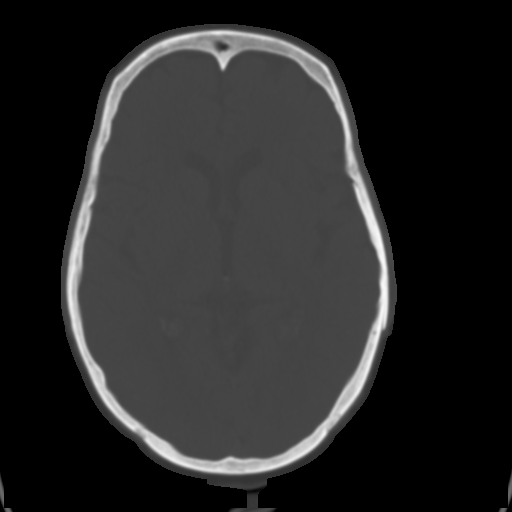
[im 17/31  brain]
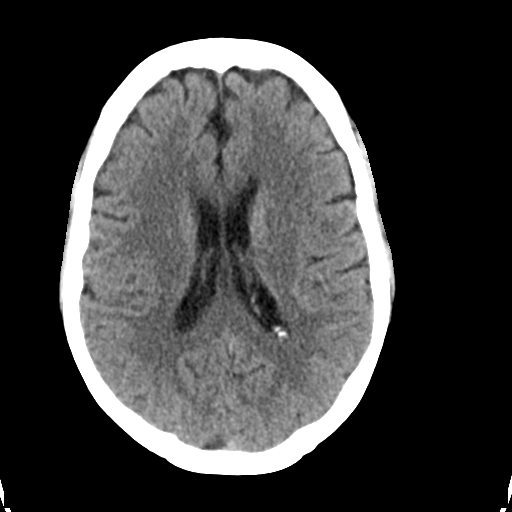
[im 20/31  brain]
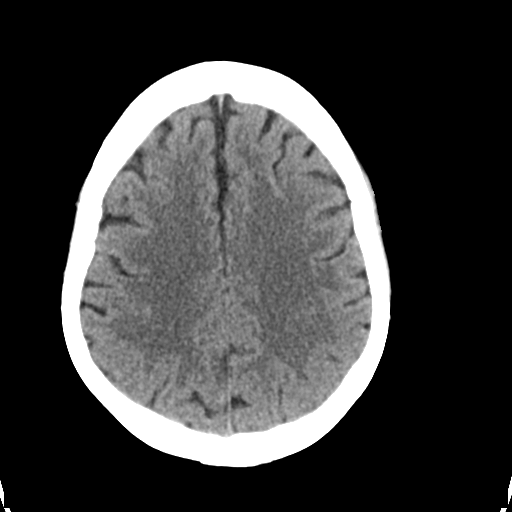
[im 23/31  brain]
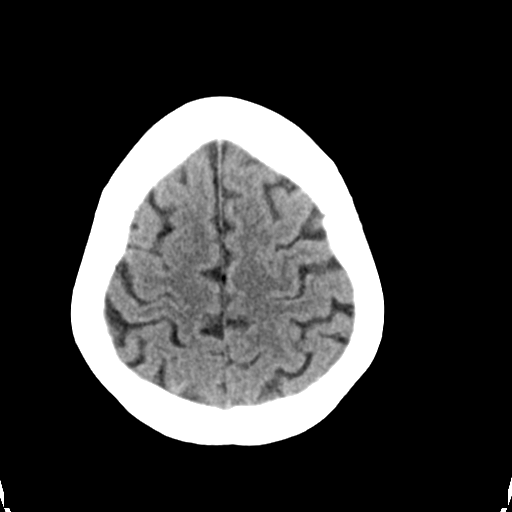
[im 25/31  brain]
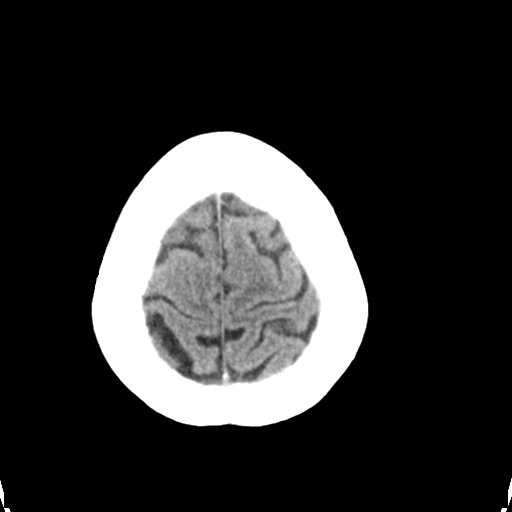
[im 25/31  bone]
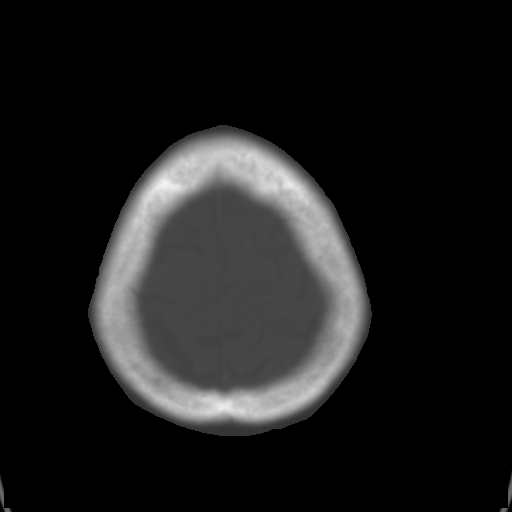
[im 28/31  brain]
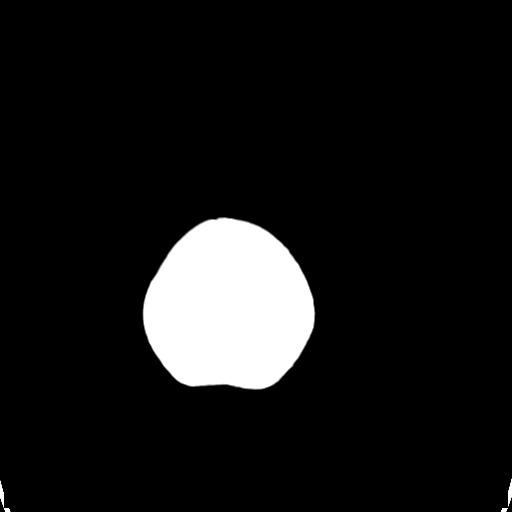

[Series 5: head 3.0 mpr cor · coronal · 0.31mm/px · 3 of 68 slices shown]
[im 23/68  brain]
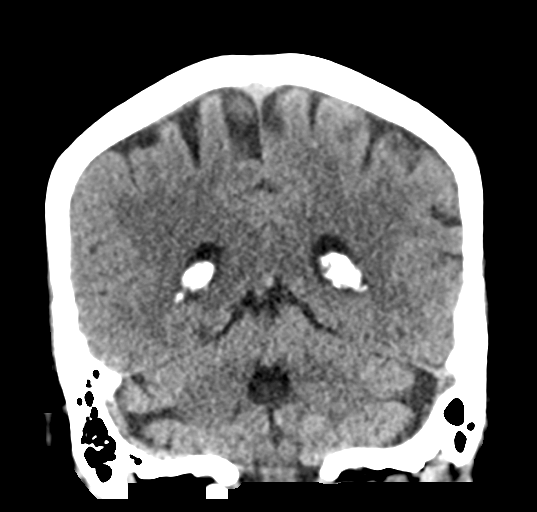
[im 30/68  brain]
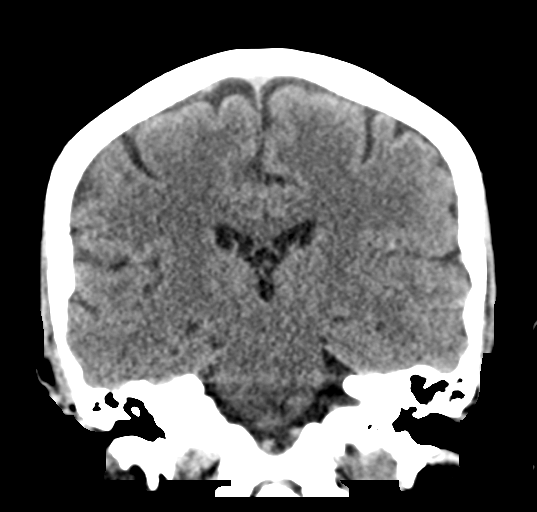
[im 38/68  brain]
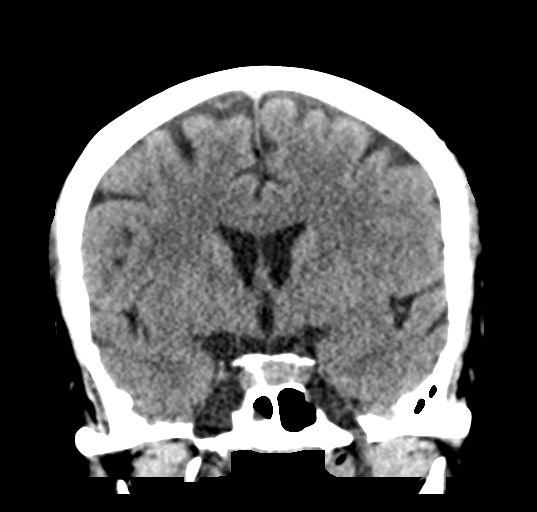

[Series 6: head 3.0 mpr sag · sagittal · 0.30mm/px · 3 of 57 slices shown]
[im 19/57  brain]
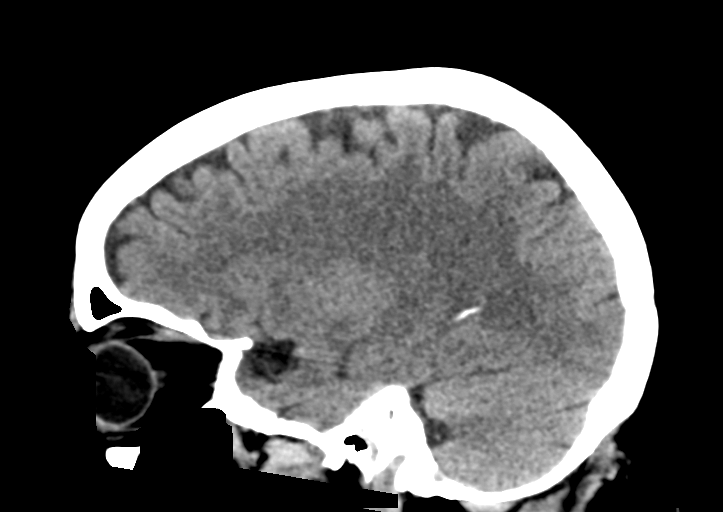
[im 29/57  brain]
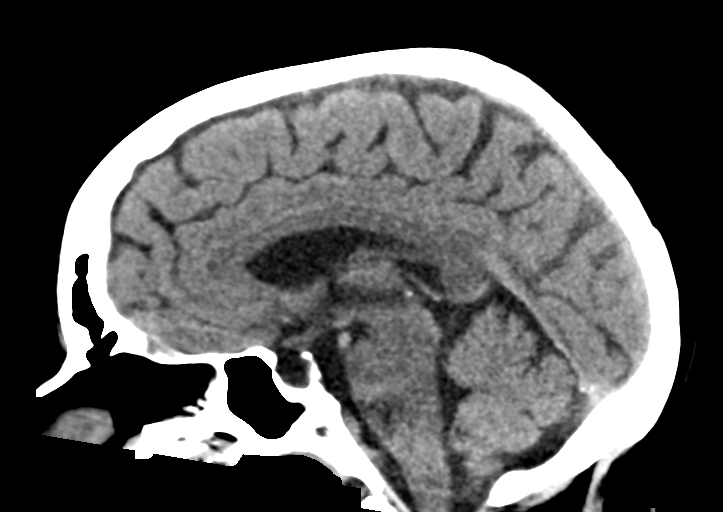
[im 38/57  brain]
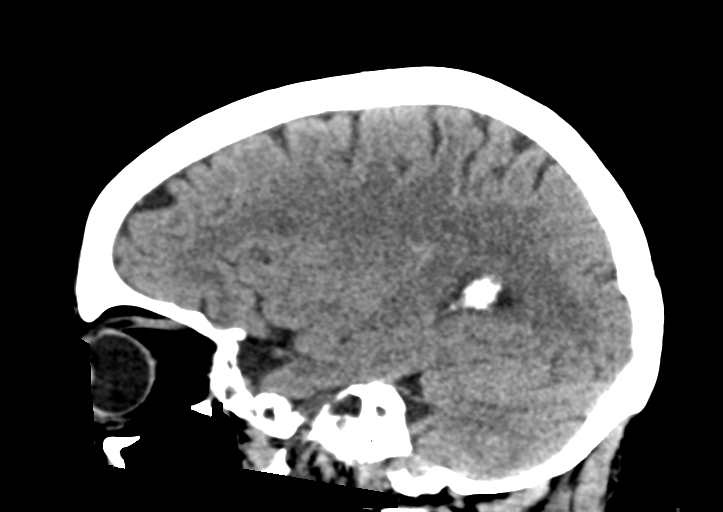

[16 of 47 positions shown; findings below may reference images not displayed]

FINDINGS: Brain: No evidence of acute infarction, hemorrhage, hydrocephalus,
extra-axial collection or mass lesion/mass effect.

Vascular: No hyperdense vessel or unexpected calcification.

Skull: Normal. Negative for fracture or focal lesion.

Sinuses/Orbits: No acute finding.

Other: None.
IMPRESSION: No acute abnormality is noted.

## 2017-11-23 ENCOUNTER — Encounter: Payer: Self-pay | Admitting: Family Medicine

## 2017-11-23 ENCOUNTER — Other Ambulatory Visit: Payer: Self-pay

## 2017-11-23 ENCOUNTER — Ambulatory Visit (INDEPENDENT_AMBULATORY_CARE_PROVIDER_SITE_OTHER): Payer: Medicare Other | Admitting: Family Medicine

## 2017-11-23 VITALS — BP 128/72 | HR 65 | Temp 98.5°F | Resp 16 | Ht 66.54 in | Wt 264.8 lb

## 2017-11-23 DIAGNOSIS — E7800 Pure hypercholesterolemia, unspecified: Secondary | ICD-10-CM

## 2017-11-23 DIAGNOSIS — F329 Major depressive disorder, single episode, unspecified: Secondary | ICD-10-CM | POA: Diagnosis not present

## 2017-11-23 DIAGNOSIS — R5381 Other malaise: Secondary | ICD-10-CM

## 2017-11-23 DIAGNOSIS — F411 Generalized anxiety disorder: Secondary | ICD-10-CM

## 2017-11-23 DIAGNOSIS — I10 Essential (primary) hypertension: Secondary | ICD-10-CM

## 2017-11-23 DIAGNOSIS — R7302 Impaired glucose tolerance (oral): Secondary | ICD-10-CM | POA: Diagnosis not present

## 2017-11-23 DIAGNOSIS — R739 Hyperglycemia, unspecified: Secondary | ICD-10-CM

## 2017-11-23 DIAGNOSIS — R296 Repeated falls: Secondary | ICD-10-CM

## 2017-11-23 DIAGNOSIS — R404 Transient alteration of awareness: Secondary | ICD-10-CM

## 2017-11-23 DIAGNOSIS — G4733 Obstructive sleep apnea (adult) (pediatric): Secondary | ICD-10-CM | POA: Diagnosis not present

## 2017-11-23 LAB — LIPID PANEL
CHOL/HDL RATIO: 3.1 ratio (ref 0.0–4.4)
Cholesterol, Total: 202 mg/dL — ABNORMAL HIGH (ref 100–199)
HDL: 65 mg/dL (ref >39)
LDL Calculated: 111 mg/dL — ABNORMAL HIGH (ref 0–99)
Triglycerides: 130 mg/dL (ref 0–149)
VLDL CHOLESTEROL CAL: 26 mg/dL (ref 5–40)

## 2017-11-23 LAB — CBC WITH DIFFERENTIAL/PLATELET
BASOS ABS: 0 10*3/uL (ref 0.0–0.2)
BASOS: 1 %
EOS (ABSOLUTE): 0 10*3/uL (ref 0.0–0.4)
Eos: 2 %
Hematocrit: 37.6 % (ref 34.0–46.6)
Hemoglobin: 11.7 g/dL (ref 11.1–15.9)
Immature Grans (Abs): 0 10*3/uL (ref 0.0–0.1)
Immature Granulocytes: 0 %
LYMPHS: 31 %
Lymphocytes Absolute: 0.8 10*3/uL (ref 0.7–3.1)
MCH: 24.8 pg — AB (ref 26.6–33.0)
MCHC: 31.1 g/dL — ABNORMAL LOW (ref 31.5–35.7)
MCV: 80 fL (ref 79–97)
MONOS ABS: 0.5 10*3/uL (ref 0.1–0.9)
Monocytes: 17 %
NEUTROS ABS: 1.3 10*3/uL — AB (ref 1.4–7.0)
Neutrophils: 49 %
PLATELETS: 232 10*3/uL (ref 150–450)
RBC: 4.72 x10E6/uL (ref 3.77–5.28)
RDW: 14.4 % (ref 12.3–15.4)
WBC: 2.7 10*3/uL — ABNORMAL LOW (ref 3.4–10.8)

## 2017-11-23 LAB — COMPREHENSIVE METABOLIC PANEL
ALT: 69 IU/L — ABNORMAL HIGH (ref 0–32)
AST: 59 IU/L — ABNORMAL HIGH (ref 0–40)
Albumin/Globulin Ratio: 1.6 (ref 1.2–2.2)
Albumin: 4.4 g/dL (ref 3.6–4.8)
Alkaline Phosphatase: 140 IU/L — ABNORMAL HIGH (ref 39–117)
BUN / CREAT RATIO: 24 (ref 12–28)
BUN: 23 mg/dL (ref 8–27)
Bilirubin Total: <0.2 mg/dL (ref 0.0–1.2)
CO2: 26 mmol/L (ref 20–29)
Calcium: 9.5 mg/dL (ref 8.7–10.3)
Chloride: 99 mmol/L (ref 96–106)
Creatinine, Ser: 0.94 mg/dL (ref 0.57–1.00)
GFR, EST AFRICAN AMERICAN: 73 mL/min/{1.73_m2} (ref >59)
GFR, EST NON AFRICAN AMERICAN: 63 mL/min/{1.73_m2} (ref >59)
GLUCOSE: 89 mg/dL (ref 65–99)
Globulin, Total: 2.7 g/dL (ref 1.5–4.5)
POTASSIUM: 4.1 mmol/L (ref 3.5–5.2)
Sodium: 141 mmol/L (ref 134–144)
Total Protein: 7.1 g/dL (ref 6.0–8.5)

## 2017-11-23 LAB — HEMOGLOBIN A1C
ESTIMATED AVERAGE GLUCOSE: 131 mg/dL
HEMOGLOBIN A1C: 6.2 % — AB (ref 4.8–5.6)

## 2017-11-23 MED ORDER — CARVEDILOL 3.125 MG PO TABS: ORAL_TABLET | ORAL | 1 refills | Status: DC

## 2017-11-23 MED ORDER — LISINOPRIL-HYDROCHLOROTHIAZIDE 20-25 MG PO TABS: 1.0000 | ORAL_TABLET | Freq: Every day | ORAL | 1 refills | Status: DC

## 2017-11-23 MED ORDER — ESCITALOPRAM OXALATE 10 MG PO TABS: 10.0000 mg | ORAL_TABLET | Freq: Every day | ORAL | 1 refills | Status: DC

## 2017-11-23 MED ORDER — SIMVASTATIN 20 MG PO TABS: 20.0000 mg | ORAL_TABLET | Freq: Every day | ORAL | 3 refills | Status: DC

## 2017-11-23 NOTE — Progress Notes (Signed)
Subjective:    Patient ID: [name] [name], female    DOB: 07-19-1950, 67 y.o.   MRN: [id number]  11/23/2017  Hypertension (6 month follow-up )    HPI This 67 y.o. female presents for SIX MONTH FOLLOW-UP of hypertension, hypercholesterolemia seizure disorder, anxiety with depression.  Management changes made last visit include the following:  -New onset obstructive sleep apnea and tolerating CPAP well.  Symptomatically improved and suffering with less daytime hypersomnolence.  Extremely compliant with CPAP.  Followed by Dr. Frances Furbish.  Status post neurology consultation -episodes of altered awareness.  Status post MRI of the brain and EEG; no epileptic activity identified on EEG.  Suffers with staring spells that last approximately 1 minute.  Empirically treated with Keppra For absent Seizures by Dr. Anne Hahn.  Currently Unable to Drive for the Next 6 Months. -Hypertension and hypercholesterolemia well controlled.  Refills of medications provided.   -For hyperglycemia, obtain hemoglobin A1c.  I recommend weight loss, exercise, and low-carbohydrate low-sugar food choices. You should AVOID: regular sodas, sweetened tea, fruit juices.  You should LIMIT: breads, pastas, rice, potatoes, and desserts/sweets.  I would recommend limiting your total carbohydrate intake per meal to 45 grams; I would limit your total carbohydrate intake per snack to 30 grams.  I would also have a goal of 60 grams of protein intake per day; this would equal 10-15 grams of protein per meal and 5-10 grams of protein per snack Cholesterol is normal. Sugar/glucose is normal at 99. Liver and kidney functions are normal. No evidence of anemia with a hemoglobin of 11.2.  UPDATE: Larey Seat this week; was walking downstairs and not holding onto raling; had purse. Shoe was slippery; no annkle twisting; went sailing. A little sore; fell on stomach; had a cushion with purse.  Fine.   Other fall was two months ago and reached over to turn the  clock around; rolled out of bed.  Literally slipped out of bed; did not get hurt.  No injuries.    OSA with CPAP: advanced home care.    BP running good.  Three seizures since January; medication increased to 1500mg  twice daily.Added Carbamazepine.    Cannot drive until December.  Discussing going into get the seizure; afraid. Last seizure was in June 2019.    Tearful lately; onset not sure.  Two months ago.    Lack of independence.  Usually triggers.  Waiting on logistics ride.   Everyone checks on patient.  Worries patient; blessing; gets old.  Hovering.    BP Readings from Last 3 Encounters:  11/23/17 128/72  07/19/17 118/82  06/28/17 112/61   Wt Readings from Last 3 Encounters:  11/23/17 264 lb 12.8 oz (120.1 kg)  07/19/17 264 lb (119.7 kg)  06/28/17 259 lb 8 oz (117.7 kg)   Immunization History  Administered Date(s) Administered  . Influenza-Unspecified 02/20/2013, 01/18/2014, 01/09/2016, 02/15/2017  . Meningococcal Conjugate 10/23/2014  . Pneumococcal Conjugate-13 04/17/2014  . Pneumococcal Polysaccharide-23 04/18/2013  . Tdap 03/08/2007  . Zoster 05/03/2010    Review of Systems  Constitutional: Negative for activity change, appetite change, chills, diaphoresis, fatigue, fever and unexpected weight change.  HENT: Negative for congestion, dental problem, drooling, ear discharge, ear pain, facial swelling, hearing loss, mouth sores, nosebleeds, postnasal drip, rhinorrhea, sinus pressure, sneezing, sore throat, tinnitus, trouble swallowing and voice change.   Eyes: Negative for photophobia, pain, discharge, redness, itching and visual disturbance.  Respiratory: Negative for apnea, cough, choking, chest tightness, shortness of breath, wheezing and stridor.  Cardiovascular: Negative for chest pain, palpitations and leg swelling.  Gastrointestinal: Negative for abdominal distention, abdominal pain, anal bleeding, blood in stool, constipation, diarrhea, nausea, rectal pain and  vomiting.  Endocrine: Negative for cold intolerance, heat intolerance, polydipsia, polyphagia and polyuria.  Genitourinary: Negative for decreased urine volume, difficulty urinating, dyspareunia, dysuria, enuresis, flank pain, frequency, genital sores, hematuria, menstrual problem, pelvic pain, urgency, vaginal bleeding, vaginal discharge and vaginal pain.  Musculoskeletal: Negative for arthralgias, back pain, gait problem, joint swelling, myalgias, neck pain and neck stiffness.  Skin: Negative for color change, pallor, rash and wound.  Allergic/Immunologic: Negative for environmental allergies, food allergies and immunocompromised state.  Neurological: Negative for dizziness, tremors, seizures, syncope, facial asymmetry, speech difficulty, weakness, light-headedness, numbness and headaches.  Hematological: Negative for adenopathy. Does not bruise/bleed easily.  Psychiatric/Behavioral: Positive for dysphoric mood. Negative for agitation, behavioral problems, confusion, decreased concentration, hallucinations, self-injury, sleep disturbance and suicidal ideas. The patient is not nervous/anxious and is not hyperactive.     Past Medical History:  Diagnosis Date  . Anxiety   . Cataract   . Chronic insomnia 10/13/2016  . Depression   . Gait disorder 03/14/2014  . HTN (hypertension)   . Hx of cardiovascular stress test    Lexiscan Myoview (1/16):  No ischemia, EF 68%, breast atten; Low Risk  . Hyperlipemia   . Memory difficulty 03/14/2014  . Meningitis due to bacteria 01-18-14  . OSA (obstructive sleep apnea) 11/30/2016  . TIA (transient ischemic attack)    Past Surgical History:  Procedure Laterality Date  . NO PAST SURGERIES     No Known Allergies Current Outpatient Medications on File Prior to Visit  Medication Sig Dispense Refill  . aspirin 81 MG chewable tablet Chew 81 mg by mouth daily.    . carbamazepine (TEGRETOL) 200 MG tablet 1/2 tablet twice a day for 2 weeks, then take one  tablet twice a day 60 tablet 3  . levETIRAcetam (KEPPRA) 1000 MG tablet   1  . Multiple Vitamins-Minerals (MULTIVITAMIN PO) Take 1 tablet by mouth daily.     No current facility-administered medications on file prior to visit.    Social History   Socioeconomic History  . Marital status: Divorced    Spouse name: Not on file  . Number of children: 1  . Years of education: college  . Highest education level: Some college, no degree  Occupational History  . Occupation: Retired Financial planner: [employer]  Social Needs  . Financial resource strain: Not hard at all  . Food insecurity:    Worry: Never true    Inability: Never true  . Transportation needs:    Medical: Yes    Non-medical: Yes  Tobacco Use  . Smoking status: Former Smoker    Packs/day: 0.50    Years: 40.00    Pack years: 20.00    Types: Cigarettes    Last attempt to quit: 07/09/2011    Years since quitting: 6.3  . Smokeless tobacco: Never Used  Substance and Sexual Activity  . Alcohol use: Yes    Alcohol/week: 0.0 oz    Comment: Rare  . Drug use: No  . Sexual activity: Never    Birth control/protection: Abstinence  Lifestyle  . Physical activity:    Days per week: 0 days    Minutes per session: 0 min  . Stress: To some extent  Relationships  . Social connections:    Talks on phone: More than three times a week  Gets together: Never    Attends religious service: More than 4 times per year    Active member of club or organization: No    Attends meetings of clubs or organizations: Never    Relationship status: Divorced  . Intimate partner violence:    Fear of current or ex partner: No    Emotionally abused: No    Physically abused: No    Forced sexual activity: No  Other Topics Concern  . Not on file  Social History Narrative   Marital status: divorced x 35 years; not dating; not interested in 2019.      Children: 1 child/son (44); 2 grandchildren; [place]      Lives:  Alone in [place] in [place].       Employment: retired 2008; Administrator, arts.      Tobacco: quit smoking in 2013.  Smoked x many years       Alcohol: none; holidays only      Exercise: none; quit exercising two years ago in 2015.        Seatbelt: 100%; no texting. Does not drive in 1610 due to seizure activity.       ADLs: no driving in 9604-5409 due to seizure activity; independent with ADLs.  Has cane for long distances      Advanced Directives: none; [name]/sister.  FULL CODE no prolonged measures.         Patient is single lives at home alone, has 1 child   Patient is right handed   Education level is 1 year of college   Caffeine consumption is 2 cups daily   Family History  Problem Relation Age of Onset  . Heart attack Mother 60  . Stroke Mother   . Hypertension Mother   . Heart attack Father 56  . Cerebral aneurysm Sister   . Hypertension Sister   . Kidney disease Sister        ESRD; HD in last year  . Hypertension Brother   . Hyperlipidemia Brother   . Rheum arthritis Sister   . Arthritis Sister   . Hypertension Brother   . Hyperlipidemia Brother   . Hyperlipidemia Brother   . Hypertension Brother        Objective:    BP 128/72   Pulse 65   Temp 98.5 F (36.9 C) (Oral)   Resp 16   Ht 5' 6.54" (1.69 m)   Wt 264 lb 12.8 oz (120.1 kg)   SpO2 95%   BMI 42.06 kg/m   Physical Exam  Constitutional: She is oriented to person, place, and time. She appears well-developed and well-nourished. No distress.  HENT:  Head: Normocephalic and atraumatic.  Right Ear: External ear normal.  Left Ear: External ear normal.  Nose: Nose normal.  Mouth/Throat: Oropharynx is clear and moist.  Eyes: Pupils are equal, round, and reactive to light. Conjunctivae and EOM are normal.  Neck: Normal range of motion and full passive range of motion without pain. Neck supple. No JVD present. Carotid bruit is not present. No thyromegaly present.  Cardiovascular: Normal rate, regular rhythm and  normal heart sounds. Exam reveals no gallop and no friction rub.  No murmur heard. Pulmonary/Chest: Effort normal and breath sounds normal. She has no wheezes. She has no rales.  Abdominal: Soft. Bowel sounds are normal. She exhibits no distension and no mass. There is no tenderness. There is no rebound and no guarding.  Musculoskeletal:       Right shoulder: Normal.  Left shoulder: Normal.       Cervical back: Normal.  Lymphadenopathy:    She has no cervical adenopathy.  Neurological: She is alert and oriented to person, place, and time. She has normal reflexes. No cranial nerve deficit. She exhibits normal muscle tone. Coordination normal.  Skin: Skin is warm and dry. No rash noted. She is not diaphoretic. No erythema. No pallor.  Psychiatric: She has a normal mood and affect. Her behavior is normal. Judgment and thought content normal.  Nursing note and vitals reviewed.  No results found. Depression screen [facility] 2/9 11/23/2017 05/23/2017 05/17/2017 11/17/2016 08/26/2016  Decreased Interest 1 0 0 0 0  Down, Depressed, Hopeless 1 0 0 0 0  PHQ - 2 Score 2 0 0 0 0  Altered sleeping 1 - - - -  Tired, decreased energy 1 - - - -  Change in appetite 0 - - - -  Feeling bad or failure about yourself  1 - - - -  Trouble concentrating - - - - -  Moving slowly or fidgety/restless 0 - - - -  Suicidal thoughts 0 - - - -  PHQ-9 Score 5 - - - -   Fall Risk  11/23/2017 05/23/2017 05/17/2017 11/17/2016 08/26/2016  Falls in the past year? Yes No No Yes No  Number falls in past yr: 2 or more - - 1 -  Injury with Fall? No - - No -        Assessment & Plan:   1. Essential hypertension   2. OSA (obstructive sleep apnea)   3. Glucose intolerance (impaired glucose tolerance)   4. Depression, reactive   5. Generalized anxiety disorder   6. Pure hypercholesterolemia   7. Hyperglycemia   8. Frequent falls   9. Debility   10. Episodic altered awareness   11. Staring episodes     Hypertension and  hypercholesterolemia: Well-controlled at this time.  Obtain labs for chronic disease management.  No changes to therapy.  Glucose intolerance: Stable.  Obtain labs.  Anxiety with depression: Worsening due to lack of independence.  Patient struggling with inability to drive and warranting continuous support from family and friends.  Patient declines increase of Lexapro therapy.  Counseling provided during visit.  Recommend psychotherapy yet patient has transportation barriers.  Obstructive sleep apnea: Well-controlled on CPAP therapy.  Seizure disorder: New onset.  Patient unable to drive for the next 6 months.  Followed closely by neurology.  A second agent recently added.  Frequent falls: 2 falls in the past 6 months.  Most consistent with deconditioning due to inability to drive prescription for lifeline provided.  Not sure that Medicare will cover this.  Recommend exercise yet patient does not have a good location to exercise and needs supervision with exercise due to seizure disorder.  Rx for LifeLine provided yet not sure if medicare will cover Expense yet recommend patient present prescription to medical supply store.  Orders Placed This Encounter  Procedures  . DME Other see comment    Medical alert system (Life Line or equivalent device).  . CBC with Differential/Platelet  . Comprehensive metabolic panel    Order Specific Question:   Has the patient fasted?    Answer:   No  . Lipid panel    Order Specific Question:   Has the patient fasted?    Answer:   No  . Hemoglobin A1c   Meds ordered this encounter  Medications  . carvedilol (COREG) 3.125 MG tablet  Sig: TAKE 1 TABLET(3.125 MG) BY MOUTH TWICE DAILY WITH A MEAL    Dispense:  180 tablet    Refill:  1  . escitalopram (LEXAPRO) 10 MG tablet    Sig: Take 1 tablet (10 mg total) by mouth at bedtime.    Dispense:  90 tablet    Refill:  1  . lisinopril-hydrochlorothiazide (PRINZIDE,ZESTORETIC) 20-25 MG tablet    Sig: Take 1  tablet by mouth daily.    Dispense:  90 tablet    Refill:  1  . simvastatin (ZOCOR) 20 MG tablet    Sig: Take 1 tablet (20 mg total) by mouth daily.    Dispense:  90 tablet    Refill:  3    Return in about 6 months (around 05/26/2018) for complete physical examiniation SAGARDIA.   Maylie Ashton Paulita Fujita, M.D. Primary Care at [facility] previously [facility] [address] [place], [place]  [address] [phone] phone [phone] fax

## 2017-11-23 NOTE — Patient Instructions (Addendum)
[facility] --- CALL TO SEE IF THEY HAVE LIFE LINES OR MEDICAL ALERT DEVICES FOR FALLS/INJUIRES, ETC.  Major Depressive Disorder, Adult Major depressive disorder (MDD) is a mental health condition. MDD often makes you feel sad, hopeless, or helpless. MDD can also cause symptoms in your body. MDD can affect your:  Work.  School.  Relationships.  Other normal activities.  MDD can range from mild to very bad. It may occur once (single episode MDD). It can also occur many times (recurrent MDD). The main symptoms of MDD often include:  Feeling sad, depressed, or irritable most of the time.  Loss of interest.  MDD symptoms also include:  Sleeping too much or too little.  Eating too much or too little.  A change in your weight.  Feeling tired (fatigue) or having low energy.  Feeling worthless.  Feeling guilty.  Trouble making decisions.  Trouble thinking clearly.  Thoughts of suicide or harming others.  Feeling weak.  Feeling agitated.  Keeping yourself from being around other people (isolation).  Follow these instructions at home: Activity  Do these things as told by your doctor: ? Go back to your normal activities. ? Exercise regularly. ? Spend time outdoors. Alcohol  Talk with your doctor about how alcohol can affect your antidepressant medicines.  Do not drink alcohol. Or, limit how much alcohol you drink. ? This means no more than 1 drink a day for nonpregnant women and 2 drinks a day for men. One drink equals one of these:  12 oz of beer.  5 oz of wine.  1 oz of hard liquor. General instructions  Take over-the-counter and prescription medicines only as told by your doctor.  Eat a healthy diet.  Get plenty of sleep.  Find activities that you enjoy. Make time to do them.  Think about joining a support group. Your doctor may be able to suggest a group for you.  Keep all follow-up visits as told by your doctor. This is  important. Where to find more information:  The [employer] on Mental Illness: ? www.nami.org  [place] [employer] of Mental Health: ? http://www.maynard.net/  National Suicide Prevention Lifeline: ? (769)043-3819. This is free, 24-hour help. Contact a doctor if:  Your symptoms get worse.  You have new symptoms. Get help right away if:  You self-harm.  You see, hear, taste, smell, or feel things that are not present (hallucinate). If you ever feel like you may hurt yourself or others, or have thoughts about taking your own life, get help right away. You can go to your nearest emergency department or call:  Your local emergency services (911 in the [place]  A suicide crisis helpline, such as the National Suicide Prevention Lifeline: ? 289-857-8804. This is open 24 hours a day.  This information is not intended to replace advice given to you by your health care provider. Make sure you discuss any questions you have with your health care provider. Document Released: 03/31/2015 Document Revised: 01/04/2016 Document Reviewed: 01/04/2016 Elsevier Interactive Patient Education  2017 [employer].   IF you received an x-ray today, you will receive an invoice from [facility]. Please contact [facility] at [phone] with questions or concerns regarding your invoice.   IF you received labwork today, you will receive an invoice from [facility]. Please contact [facility] at [phone] with questions or concerns regarding your invoice.   Our billing staff will not be able to assist you with questions regarding bills from these companies.  You will  be contacted with the lab results as soon as they are available. The fastest way to get your results is to activate your My Chart account. Instructions are located on the last page of this paperwork. If you have not heard from [place] regarding the results in 2 weeks, please contact this office.

## 2017-11-24 DIAGNOSIS — R404 Transient alteration of awareness: Secondary | ICD-10-CM | POA: Insufficient documentation

## 2017-12-21 DIAGNOSIS — R6889 Other general symptoms and signs: Secondary | ICD-10-CM | POA: Diagnosis not present

## 2017-12-25 ENCOUNTER — Other Ambulatory Visit: Payer: Self-pay | Admitting: Family Medicine

## 2017-12-29 DIAGNOSIS — R6889 Other general symptoms and signs: Secondary | ICD-10-CM | POA: Diagnosis not present

## 2018-01-04 ENCOUNTER — Encounter: Payer: Self-pay | Admitting: Neurology

## 2018-01-04 ENCOUNTER — Ambulatory Visit: Payer: Medicare Other | Admitting: Neurology

## 2018-01-04 VITALS — BP 110/78 | HR 59 | Wt 267.0 lb

## 2018-01-04 DIAGNOSIS — Z5181 Encounter for therapeutic drug level monitoring: Secondary | ICD-10-CM | POA: Diagnosis not present

## 2018-01-04 DIAGNOSIS — R404 Transient alteration of awareness: Secondary | ICD-10-CM

## 2018-01-04 DIAGNOSIS — G009 Bacterial meningitis, unspecified: Secondary | ICD-10-CM | POA: Diagnosis not present

## 2018-01-04 DIAGNOSIS — R413 Other amnesia: Secondary | ICD-10-CM

## 2018-01-04 DIAGNOSIS — R6889 Other general symptoms and signs: Secondary | ICD-10-CM | POA: Diagnosis not present

## 2018-01-04 DIAGNOSIS — G4733 Obstructive sleep apnea (adult) (pediatric): Secondary | ICD-10-CM | POA: Diagnosis not present

## 2018-01-04 NOTE — Progress Notes (Signed)
Reason for visit: Seizures  [name] [name] is an 67 y.o. female  History of present illness:  [name] [name] is a 67 year old right-handed female with a history of staring events that likely represents seizures.  The patient has been on maximum doses of Keppra taking 1500 mg twice daily, carbamazepine has been added to the regimen taking 200 mg twice daily.  She has tolerated these medications well, as long she has been on the carbamazepine, she has not had a recurring event.  She has been on carbamazepine since June 2019.  She returns to the office today for an evaluation.  She is not driving a car.  Past Medical History:  Diagnosis Date  . Anxiety   . Cataract   . Chronic insomnia 10/13/2016  . Depression   . Gait disorder 03/14/2014  . HTN (hypertension)   . Hx of cardiovascular stress test    Lexiscan Myoview (1/16):  No ischemia, EF 68%, breast atten; Low Risk  . Hyperlipemia   . Memory difficulty 03/14/2014  . Meningitis due to bacteria 01-18-14  . OSA (obstructive sleep apnea) 11/30/2016  . TIA (transient ischemic attack)     Past Surgical History:  Procedure Laterality Date  . NO PAST SURGERIES      Family History  Problem Relation Age of Onset  . Heart attack Mother 37  . Stroke Mother   . Hypertension Mother   . Heart attack Father 60  . Cerebral aneurysm Sister   . Hypertension Sister   . Kidney disease Sister        ESRD; HD in last year  . Hypertension Brother   . Hyperlipidemia Brother   . Rheum arthritis Sister   . Arthritis Sister   . Hypertension Brother   . Hyperlipidemia Brother   . Hyperlipidemia Brother   . Hypertension Brother     Social history:  reports that she quit smoking about 6 years ago. Her smoking use included cigarettes. She has a 20.00 pack-year smoking history. She has never used smokeless tobacco. She reports that she drinks alcohol. She reports that she does not use drugs.   No Known Allergies  Medications:  Prior to  Admission medications   Medication Sig Start Date End Date Taking? Authorizing Provider  aspirin 81 MG chewable tablet Chew 81 mg by mouth daily.   Yes [provider]  carbamazepine (TEGRETOL) 200 MG tablet 1/2 tablet twice a day for 2 weeks, then take one tablet twice a day 10/07/17  Yes York Spaniel, MD  carvedilol (COREG) 3.125 MG tablet TAKE 1 TABLET(3.125 MG) BY MOUTH TWICE DAILY WITH A MEAL 11/23/17  Yes Ethelda Chick, MD  escitalopram (LEXAPRO) 10 MG tablet Take 1 tablet (10 mg total) by mouth at bedtime. 11/23/17  Yes Ethelda Chick, MD  levETIRAcetam (KEPPRA) 1000 MG tablet Take 1,500 mg by mouth 2 (two) times daily.  10/26/17  Yes [provider]  lisinopril-hydrochlorothiazide (PRINZIDE,ZESTORETIC) 20-25 MG tablet Take 1 tablet by mouth daily. 11/23/17  Yes Ethelda Chick, MD  Multiple Vitamins-Minerals (MULTIVITAMIN PO) Take 1 tablet by mouth daily.   Yes [provider]  simvastatin (ZOCOR) 20 MG tablet Take 1 tablet (20 mg total) by mouth daily. 11/23/17  Yes Ethelda Chick, MD    ROS:  Out of a complete 14 system review of symptoms, the patient complains only of the following symptoms, and all other reviewed systems are negative.  Frequency of urination Sleep apnea on CPAP Depression  Blood pressure 110/78, pulse (!) 59, weight 267 lb (121.1 kg), SpO2 98 %.  Physical Exam  General: The patient is alert and cooperative at the time of the examination.  The patient is markedly obese.  Skin: No significant peripheral edema is noted.   Neurologic Exam  Mental status: The patient is alert and oriented x 3 at the time of the examination. The patient has apparent normal recent and remote memory, with an apparently normal attention span and concentration ability.   Cranial nerves: Facial symmetry is present. Speech is normal, no aphasia or dysarthria is noted. Extraocular movements are full. Visual fields are full.  Motor: The patient has good  strength in all 4 extremities.  Sensory examination: Soft touch sensation is symmetric on the face, arms, and legs.  Coordination: The patient has good finger-nose-finger and heel-to-shin bilaterally.  Gait and station: The patient has a normal gait. Tandem gait is normal. Romberg is negative. No drift is seen.  Reflexes: Deep tendon reflexes are symmetric.   Assessment/Plan:  1.  Probable seizures  2.  Mild memory disturbance  The patient will be sent for blood work today, if the carbamazepine level is therapeutic we will continue the current dose, if not we will increase the dose.  The patient will remain on Keppra, she is on a maximum dose.  She will follow-up in 6 months.  Marlan Palau MD 01/04/2018 11:29 AM  [facility] [address] [place], [place] [address]  Phone [phone] Fax [phone]

## 2018-01-06 ENCOUNTER — Other Ambulatory Visit: Payer: Self-pay | Admitting: Neurology

## 2018-01-06 LAB — COMPREHENSIVE METABOLIC PANEL
ALBUMIN: 4.3 g/dL (ref 3.6–4.8)
ALT: 25 IU/L (ref 0–32)
AST: 30 IU/L (ref 0–40)
Albumin/Globulin Ratio: 1.6 (ref 1.2–2.2)
Alkaline Phosphatase: 115 IU/L (ref 39–117)
BUN / CREAT RATIO: 20 (ref 12–28)
BUN: 17 mg/dL (ref 8–27)
Bilirubin Total: 0.2 mg/dL (ref 0.0–1.2)
CALCIUM: 9.3 mg/dL (ref 8.7–10.3)
CO2: 26 mmol/L (ref 20–29)
Chloride: 97 mmol/L (ref 96–106)
Creatinine, Ser: 0.87 mg/dL (ref 0.57–1.00)
GFR, EST AFRICAN AMERICAN: 80 mL/min/{1.73_m2} (ref >59)
GFR, EST NON AFRICAN AMERICAN: 70 mL/min/{1.73_m2} (ref >59)
GLOBULIN, TOTAL: 2.7 g/dL (ref 1.5–4.5)
Glucose: 90 mg/dL (ref 65–99)
Potassium: 4.3 mmol/L (ref 3.5–5.2)
Sodium: 139 mmol/L (ref 134–144)
TOTAL PROTEIN: 7 g/dL (ref 6.0–8.5)

## 2018-01-06 LAB — CBC WITH DIFFERENTIAL/PLATELET
BASOS: 1 %
Basophils Absolute: 0 10*3/uL (ref 0.0–0.2)
EOS (ABSOLUTE): 0.1 10*3/uL (ref 0.0–0.4)
Eos: 3 %
HEMOGLOBIN: 11.3 g/dL (ref 11.1–15.9)
Hematocrit: 35.3 % (ref 34.0–46.6)
Immature Grans (Abs): 0 10*3/uL (ref 0.0–0.1)
Immature Granulocytes: 0 %
LYMPHS ABS: 0.6 10*3/uL — AB (ref 0.7–3.1)
Lymphs: 22 %
MCH: 25.5 pg — AB (ref 26.6–33.0)
MCHC: 32 g/dL (ref 31.5–35.7)
MCV: 80 fL (ref 79–97)
MONOCYTES: 25 %
MONOS ABS: 0.7 10*3/uL (ref 0.1–0.9)
Neutrophils Absolute: 1.5 10*3/uL (ref 1.4–7.0)
Neutrophils: 49 %
Platelets: 199 10*3/uL (ref 150–450)
RBC: 4.43 x10E6/uL (ref 3.77–5.28)
RDW: 14.8 % (ref 12.3–15.4)
WBC: 3 10*3/uL — ABNORMAL LOW (ref 3.4–10.8)

## 2018-01-06 LAB — LEVETIRACETAM LEVEL: LEVETIRACETAM: 53.6 ug/mL — AB (ref 10.0–40.0)

## 2018-01-06 LAB — CARBAMAZEPINE LEVEL, TOTAL: CARBAMAZEPINE LVL: 7 ug/mL (ref 4.0–12.0)

## 2018-01-06 MED ORDER — CARBAMAZEPINE 200 MG PO TABS: ORAL_TABLET | ORAL | 3 refills | Status: DC

## 2018-01-09 ENCOUNTER — Telehealth: Payer: Self-pay | Admitting: Neurology

## 2018-01-09 NOTE — Telephone Encounter (Signed)
Patient requesting lab results

## 2018-01-09 NOTE — Telephone Encounter (Signed)
Called and spoke with pt. Advised results were released on mychart. Verified she does use this, she will look to make sure results showing after our phone call. I did go over results per Dr. Anne Hahn note. She verbalized understanding and appreciation for call. Nothing further needed.

## 2018-01-24 DIAGNOSIS — R6889 Other general symptoms and signs: Secondary | ICD-10-CM | POA: Diagnosis not present

## 2018-03-22 ENCOUNTER — Telehealth: Payer: Self-pay | Admitting: Neurology

## 2018-03-22 NOTE — Telephone Encounter (Signed)
Pt requesting a call stating that 12/6 will be 6 months since her last seizure wanting to make sure that he is still wanting to still going to clear her. Please advise.

## 2018-03-22 NOTE — Telephone Encounter (Signed)
Spoke with  pt.  December will be 6 mos. since her last sz. and she would like to to see him to get clearance to drive again. Appt. sched. 04/12/18, arrival time of 7am for a 7:30am appt/fim

## 2018-04-12 ENCOUNTER — Encounter: Payer: Self-pay | Admitting: Neurology

## 2018-04-12 ENCOUNTER — Ambulatory Visit: Payer: Medicare Other | Admitting: Neurology

## 2018-04-12 VITALS — BP 123/61 | HR 55 | Ht 66.0 in | Wt 260.0 lb

## 2018-04-12 DIAGNOSIS — R404 Transient alteration of awareness: Secondary | ICD-10-CM | POA: Diagnosis not present

## 2018-04-12 MED ORDER — CARBAMAZEPINE 200 MG PO TABS: 200.0000 mg | ORAL_TABLET | Freq: Two times a day (BID) | ORAL | 3 refills | Status: DC

## 2018-04-12 NOTE — Progress Notes (Signed)
Reason for visit: Seizures  [name] [name] is an 67 y.o. female  History of present illness:  Ms. Strehle is a 67 year old right-handed female with a history of episodes of staring off that are likely representing seizures.  The patient has been on a combination of Keppra and carbamazepine, the carbamazepine was added in June 2019.  Since that time, the patient has had no observed episodes.  Unfortunately, the patient may not know whether she has had an episode if she is by herself.  The patient is tolerating the medications quite well.  She tends to run a low white blood count, but this has been stable on 2 blood draws, her liver enzymes are normal on carbamazepine.  She is on 400 mg daily of carbamazepine, her last blood level was 7.  The patient returns to this office for an evaluation.  Past Medical History:  Diagnosis Date  . Anxiety   . Cataract   . Chronic insomnia 10/13/2016  . Depression   . Gait disorder 03/14/2014  . HTN (hypertension)   . Hx of cardiovascular stress test    Lexiscan Myoview (1/16):  No ischemia, EF 68%, breast atten; Low Risk  . Hyperlipemia   . Memory difficulty 03/14/2014  . Meningitis due to bacteria 01-18-14  . OSA (obstructive sleep apnea) 11/30/2016  . TIA (transient ischemic attack)     Past Surgical History:  Procedure Laterality Date  . NO PAST SURGERIES      Family History  Problem Relation Age of Onset  . Heart attack Mother 53  . Stroke Mother   . Hypertension Mother   . Heart attack Father 59  . Cerebral aneurysm Sister   . Hypertension Sister   . Kidney disease Sister        ESRD; HD in last year  . Hypertension Brother   . Hyperlipidemia Brother   . Rheum arthritis Sister   . Arthritis Sister   . Hypertension Brother   . Hyperlipidemia Brother   . Hyperlipidemia Brother   . Hypertension Brother     Social history:  reports that she quit smoking about 6 years ago. Her smoking use included cigarettes. She has a 20.00  pack-year smoking history. She has never used smokeless tobacco. She reports that she drinks alcohol. She reports that she does not use drugs.   No Known Allergies  Medications:  Prior to Admission medications   Medication Sig Start Date End Date Taking? Authorizing Provider  aspirin 81 MG chewable tablet Chew 81 mg by mouth daily.   Yes [provider]  carbamazepine (TEGRETOL) 200 MG tablet 1/2 tablet twice a day for 2 weeks, then take one tablet twice a day 01/06/18  Yes York Spaniel, MD  carvedilol (COREG) 3.125 MG tablet TAKE 1 TABLET(3.125 MG) BY MOUTH TWICE DAILY WITH A MEAL 11/23/17  Yes Ethelda Chick, MD  escitalopram (LEXAPRO) 10 MG tablet Take 1 tablet (10 mg total) by mouth at bedtime. 11/23/17  Yes Ethelda Chick, MD  levETIRAcetam (KEPPRA) 750 MG tablet Take 750 mg by mouth. 2 in the morning 2 in the evening.   Yes [provider]  lisinopril-hydrochlorothiazide (PRINZIDE,ZESTORETIC) 20-25 MG tablet Take 1 tablet by mouth daily. 11/23/17  Yes Ethelda Chick, MD  Multiple Vitamins-Minerals (MULTIVITAMIN PO) Take 1 tablet by mouth daily.   Yes [provider]  simvastatin (ZOCOR) 20 MG tablet Take 1 tablet (20 mg total) by mouth daily. 11/23/17  Yes Ethelda Chick,  MD    ROS:  Out of a complete 14 system review of symptoms, the patient complains only of the following symptoms, and all other reviewed systems are negative.  Sleep apnea  Blood pressure 123/61, pulse (!) 55, height 5\' 6"  (1.676 m), weight 260 lb (117.9 kg).  Physical Exam  General: The patient is alert and cooperative at the time of the examination.  The patient is moderately obese.  Skin: No significant peripheral edema is noted.   Neurologic Exam  Mental status: The patient is alert and oriented x 3 at the time of the examination. The patient has apparent normal recent and remote memory, with an apparently normal attention span and concentration ability.   Cranial nerves:  Facial symmetry is present. Speech is normal, no aphasia or dysarthria is noted. Extraocular movements are full. Visual fields are full.  Motor: The patient has good strength in all 4 extremities.  Sensory examination: Soft touch sensation is symmetric on the face, arms, and legs.  Coordination: The patient has good finger-nose-finger and heel-to-shin bilaterally.  Gait and station: The patient has a normal gait. Tandem gait is slightly unsteady. Romberg is negative. No drift is seen.  Reflexes: Deep tendon reflexes are symmetric.   Assessment/Plan:  1.  Episodes of transient alteration of awareness, probable seizures  2.  Sleep apnea  The patient has gone 6 months at this point without any observed episodes of staring off.  She may return to driving at this point.  We will follow-up in 6 months, she will contact our office if any new events are noted.  We will draw blood at that time.  The patient will go on vitamin D supplementation while on the carbamazepine, 1000 international units daily.  Marlan Palau MD 04/12/2018 7:28 AM  [facility] [address] [place], [place] [address]  Phone [phone] Fax [phone]

## 2018-05-18 ENCOUNTER — Ambulatory Visit (INDEPENDENT_AMBULATORY_CARE_PROVIDER_SITE_OTHER): Payer: Medicare Other | Admitting: Emergency Medicine

## 2018-05-18 ENCOUNTER — Ambulatory Visit: Payer: Self-pay

## 2018-05-18 VITALS — BP 118/78 | HR 67 | Ht 66.0 in | Wt 260.6 lb

## 2018-05-18 DIAGNOSIS — Z0000 Encounter for general adult medical examination without abnormal findings: Secondary | ICD-10-CM | POA: Diagnosis not present

## 2018-05-18 NOTE — Patient Instructions (Signed)
Healthy Eating Following a healthy eating pattern may help you to achieve and maintain a healthy body weight, reduce the risk of chronic disease, and live a long and productive life. It is important to follow a healthy eating pattern at an appropriate calorie level for your body. Your nutritional needs should be met primarily through food by choosing a variety of nutrient-rich foods. What are tips for following this plan? Reading food labels  Read labels and choose the following: ? Reduced or low sodium. ? Juices with 100% fruit juice. ? Foods with low saturated fats and high polyunsaturated and monounsaturated fats. ? Foods with whole grains, such as whole wheat, cracked wheat, brown rice, and wild rice. ? Whole grains that are fortified with folic acid. This is recommended for women who are pregnant or who want to become pregnant.  Read labels and avoid the following: ? Foods with a lot of added sugars. These include foods that contain brown sugar, corn sweetener, corn syrup, dextrose, fructose, glucose, high-fructose corn syrup, honey, invert sugar, lactose, malt syrup, maltose, molasses, raw sugar, sucrose, trehalose, or turbinado sugar.  Do not eat more than the following amounts of added sugar per day:  6 teaspoons (25 g) for women.  9 teaspoons (38 g) for men. ? Foods that contain processed or refined starches and grains. ? Refined grain products, such as white flour, degermed cornmeal, white bread, and white rice. Shopping  Choose nutrient-rich snacks, such as vegetables, whole fruits, and nuts. Avoid high-calorie and high-sugar snacks, such as potato chips, fruit snacks, and candy.  Use oil-based dressings and spreads on foods instead of solid fats such as butter, stick margarine, or cream cheese.  Limit pre-made sauces, mixes, and "instant" products such as flavored rice, instant noodles, and ready-made pasta.  Try more plant-protein sources, such as tofu, tempeh, black beans,  edamame, lentils, nuts, and seeds.  Explore eating plans such as the Mediterranean diet or vegetarian diet. Cooking  Use oil to saut or stir-fry foods instead of solid fats such as butter, stick margarine, or lard.  Try baking, boiling, grilling, or broiling instead of frying.  Remove the fatty part of meats before cooking.  Steam vegetables in water or broth. Meal planning   At meals, imagine dividing your plate into fourths: ? One-half of your plate is fruits and vegetables. ? One-fourth of your plate is whole grains. ? One-fourth of your plate is protein, especially lean meats, poultry, eggs, tofu, beans, or nuts.  Include low-fat dairy as part of your daily diet. Lifestyle  Choose healthy options in all settings, including home, work, school, restaurants, or stores.  Prepare your food safely: ? Wash your hands after handling raw meats. ? Keep food preparation surfaces clean by regularly washing with hot, soapy water. ? Keep raw meats separate from ready-to-eat foods, such as fruits and vegetables. ? Cook seafood, meat, poultry, and eggs to the recommended internal temperature. ? Store foods at safe temperatures. In general:  Keep cold foods at 59F (4.4C) or below.  Keep hot foods at 159F (60C) or above.  Keep your freezer at [facility] (-17.8C) or below.  Foods are no longer safe to eat when they have been between the temperatures of 40-159F (4.4-60C) for more than 2 hours. What foods should I eat? Fruits Aim to eat 2 cup-equivalents of fresh, canned (in natural juice), or frozen fruits each day. Examples of 1 cup-equivalent of fruit include 1 small apple, 8 large strawberries, 1 cup canned fruit,  cup  dried fruit, or 1 cup 100% juice. Vegetables Aim to eat 2-3 cup-equivalents of fresh and frozen vegetables each day, including different varieties and colors. Examples of 1 cup-equivalent of vegetables include 2 medium carrots, 2 cups raw, leafy greens, 1 cup chopped  vegetable (raw or cooked), or 1 medium baked potato. Grains Aim to eat 6 ounce-equivalents of whole grains each day. Examples of 1 ounce-equivalent of grains include 1 slice of bread, 1 cup ready-to-eat cereal, 3 cups popcorn, or  cup cooked rice, pasta, or cereal. Meats and other proteins Aim to eat 5-6 ounce-equivalents of protein each day. Examples of 1 ounce-equivalent of protein include 1 egg, 1/2 cup nuts or seeds, or 1 tablespoon (16 g) peanut butter. A cut of meat or fish that is the size of a deck of cards is about 3-4 ounce-equivalents.  Of the protein you eat each week, try to have at least 8 ounces come from seafood. This includes salmon, trout, herring, and anchovies. Dairy Aim to eat 3 cup-equivalents of fat-free or low-fat dairy each day. Examples of 1 cup-equivalent of dairy include 1 cup (240 mL) milk, 8 ounces (250 g) yogurt, 1 ounces (44 g) natural cheese, or 1 cup (240 mL) fortified soy milk.      Thank you for taking time to come for your Medicare Wellness Visit. I appreciate your ongoing commitment to your health goals. Please review the following plan we discussed and let me know if I can assist you in the future.  Leroy Kennedy LPN

## 2018-05-18 NOTE — Progress Notes (Signed)
Presents today for [employer] Annual Wellness Visit  Interpreter used for this visit? no  Patient Care Team: Georgina QuintSagardia, Miguel Jose, MD as PCP - General (Internal Medicine) Cephus RicherPerez, Joseph, MD (Unknown Physician Specialty) Juluis Pitchanner, James, DDS (Dentistry) Huston FoleyAthar, Saima, MD as Attending Physician (Neurology) York SpanielWillis, Charles K, MD as Consulting Physician (Neurology)     Other Screening: Last screening for diabetes: no Last lipid screening: no  ADVANCE DIRECTIVES: Discussed: yes No living will Patient declined information   Immunization status:  Immunization History  Administered Date(s) Administered  . Influenza-Unspecified 02/20/2013, 01/18/2014, 01/09/2016, 02/15/2017  . Meningococcal Conjugate 10/23/2014  . Pneumococcal Conjugate-13 04/17/2014  . Pneumococcal Polysaccharide-23 04/18/2013  . Tdap 03/08/2007  . Zoster 05/03/2010  . Zoster Recombinat (Shingrix) 12/28/2017     Health Maintenance Due  Topic Date Due  . TETANUS/TDAP  03/07/2017  . PNA vac Low Risk Adult (2 of 2 - PPSV23) 04/18/2018     Functional Status Survey:     6CIT Screen 05/18/2018 05/18/2018 05/17/2017  What Year? 0 points 0 points 0 points  What month? 0 points 0 points 0 points  What time? 0 points 0 points 0 points  Count back from 20 0 points 0 points 0 points  Months in reverse 0 points 0 points 0 points  Repeat phrase 0 points - 2 points  Total Score 0 - 2        Clinical Support [facility]  AUDIT-C Score  0       Home Environment: Free of scattered rugs, hand rails in bathroom    Patient Active Problem List   Diagnosis Date Noted  . Staring episodes 11/24/2017  . OSA (obstructive sleep apnea) 11/30/2016  . Episodic altered awareness 10/13/2016  . Chronic insomnia 10/13/2016  . Near syncope 08/26/2016  . Transient cerebral ischemia 08/26/2016  . Transient memory loss 08/26/2016  . Daytime sleepiness 08/26/2016  . Morbid obesity (HCC)  05/31/2016  . Glucose intolerance (impaired glucose tolerance) 05/18/2016  . 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 Generalized anxiety disorder 03/15/2014  . Memory difficulty 03/14/2014  . Debility 02/01/2014  . Demand myocardial ischemic related infarction 01/23/2014    Class: Diagnosis of  . Bacterial meningitis-01/18/2014-s.pneumo 01/18/2014  . Depression, reactive 10/12/2012  . Chronic neutropenia (HCC) 04/14/2012  . Hyperlipemia 10/20/2011  . HTN (hypertension) 10/20/2011     Past Medical History:  Diagnosis Date  . Anxiety   . Cataract   . Chronic insomnia 10/13/2016  . Depression   . Gait disorder 03/14/2014  . HTN (hypertension)   . Hx of cardiovascular stress test    Lexiscan Myoview (1/16):  No ischemia, EF 68%, breast atten; Low Risk  . Hyperlipemia   . Memory difficulty 03/14/2014  . Meningitis due to bacteria 01-18-14  . OSA (obstructive sleep apnea) 11/30/2016  . TIA (transient ischemic attack)      Past Surgical History:  Procedure Laterality Date  . NO PAST SURGERIES       Family History  Problem Relation Age of Onset  . Heart attack Mother 670  . Stroke Mother   . Hypertension Mother   . Heart attack Father 7558  . Cerebral aneurysm Sister   . Hypertension Sister   . Kidney disease Sister        ESRD; HD in last year  . Hypertension Brother   . Hyperlipidemia Brother   . Rheum arthritis Sister   . Arthritis Sister   .  Hypertension Brother   . Hyperlipidemia Brother   . Hyperlipidemia Brother   . Hypertension Brother      Social History   Socioeconomic History  . Marital status: Divorced    Spouse name: Not on file  . Number of children: 1  . Years of education: college  . Highest education level: Some college, no degree  Occupational History  . Occupation: Retired Financial planner: [employer]  Social Needs  . Financial resource strain: Not hard at all  . Food insecurity:    Worry: Never true    Inability: Never true  .  Transportation needs:    Medical: Yes    Non-medical: Yes  Tobacco Use  . Smoking status: Former Smoker    Packs/day: 0.50    Years: 40.00    Pack years: 20.00    Types: Cigarettes    Last attempt to quit: 07/09/2011    Years since quitting: 6.8  . Smokeless tobacco: Never Used  Substance and Sexual Activity  . Alcohol use: Yes    Alcohol/week: 0.0 standard drinks    Comment: Rare  . Drug use: No  . Sexual activity: Never    Birth control/protection: Abstinence  Lifestyle  . Physical activity:    Days per week: 0 days    Minutes per session: 0 min  . Stress: To some extent  Relationships  . Social connections:    Talks on phone: More than three times a week    Gets together: Never    Attends religious service: More than 4 times per year    Active member of club or organization: No    Attends meetings of clubs or organizations: Never    Relationship status: Divorced  . Intimate partner violence:    Fear of current or ex partner: No    Emotionally abused: No    Physically abused: No    Forced sexual activity: No  Other Topics Concern  . Not on file  Social History Narrative   Marital status: divorced x 35 years; not dating; not interested in 2019.      Children: 1 child/son (44); 2 grandchildren; [place]lock      Lives:  Alone in [place] in [place].      Employment: retired 2008; Administrator, arts.      Tobacco: quit smoking in 2013.  Smoked x many years       Alcohol: none; holidays only      Exercise: none; quit exercising two years ago in 2015.        Seatbelt: 100%; no texting. Does not drive in 6144 due to seizure activity.       ADLs: no driving in 3154-0086 due to seizure activity; independent with ADLs.  Has cane for long distances      Advanced Directives: none; [name]/sister.  FULL CODE no prolonged measures.         Patient is single lives at home alone, has 1 child   Patient is right handed   Education level is 1 year of college    Caffeine consumption is 2 cups daily     No Known Allergies   Prior to Admission medications   Medication Sig Start Date End Date Taking? Authorizing Provider  aspirin 81 MG chewable tablet Chew 81 mg by mouth daily.   Yes [provider]  carbamazepine (TEGRETOL) 200 MG tablet Take 1 tablet (200 mg total) by mouth 2 (two) times daily. 04/12/18  Yes Stephanie Acre  K, MD  carvedilol (COREG) 3.125 MG tablet TAKE 1 TABLET(3.125 MG) BY MOUTH TWICE DAILY WITH A MEAL 11/23/17  Yes Ethelda Chick, MD  cholecalciferol (VITAMIN D3) 25 MCG (1000 UT) tablet Take 1,000 Units by mouth daily.   Yes [provider]  escitalopram (LEXAPRO) 10 MG tablet Take 1 tablet (10 mg total) by mouth at bedtime. 11/23/17  Yes Ethelda Chick, MD  levETIRAcetam (KEPPRA) 750 MG tablet Take 750 mg by mouth. 2 in the morning 2 in the evening.   Yes [provider]  lisinopril-hydrochlorothiazide (PRINZIDE,ZESTORETIC) 20-25 MG tablet Take 1 tablet by mouth daily. 11/23/17  Yes Ethelda Chick, MD  Multiple Vitamins-Minerals (MULTIVITAMIN PO) Take 1 tablet by mouth daily.   Yes [provider]  simvastatin (ZOCOR) 20 MG tablet Take 1 tablet (20 mg total) by mouth daily. 11/23/17  Yes Ethelda Chick, MD     Depression screen [facility] 2/9 05/18/2018 11/23/2017 05/23/2017 05/17/2017 11/17/2016  Decreased Interest 0 1 0 0 0  Down, Depressed, Hopeless 0 1 0 0 0  PHQ - 2 Score 0 2 0 0 0  Altered sleeping - 1 - - -  Tired, decreased energy - 1 - - -  Change in appetite - 0 - - -  Feeling bad or failure about yourself  - 1 - - -  Trouble concentrating - - - - -  Moving slowly or fidgety/restless - 0 - - -  Suicidal thoughts - 0 - - -  PHQ-9 Score - 5 - - -     Fall Risk  05/18/2018 11/23/2017 05/23/2017 05/17/2017 11/17/2016  Falls in the past year? 1 Yes No No Yes  Number falls in past yr: 0 2 or more - - 1  Injury with Fall? 0 No - - No  Follow up Education provided;Falls prevention discussed - -  - -      PHYSICAL EXAM: BP 105/68   Pulse 67   Ht 5\' 6"  (1.676 m)   Wt 260 lb 9.6 oz (118.2 kg)   BMI 42.06 kg/m    Wt Readings from Last 3 Encounters:  05/18/18 260 lb 9.6 oz (118.2 kg)  04/12/18 260 lb (117.9 kg)  01/04/18 267 lb (121.1 kg)   Physical Exam   Education/Counseling provided regarding diet and exercise, prevention of chronic diseases, smoking/tobacco cessation, if applicable, and reviewed "Covered Medicare Preventive Services."   ASSESSMENT/PLAN:        Marital status: divorced x 35 years; not dating; not interested in 2019.    Children: 1 child/son (44); 2 grandchildren; [place]    Lives:  Alone in [place] in [place].    Employment: retired 2008; Administrator, arts.    Tobacco: quit smoking in 2013.  Smoked x many years     Alcohol: none; holidays only    Exercise: none; quit exercising two years ago in 2015.      Seatbelt: 100%; no texting. Does not drive in [address]758 due to seizure activity.     ADLs: no driving in 8325-4982 due to seizure activity; independent with ADLs.  Has cane for long distances    Advanced Directives: none; [name]/sister.  FULL CODE no prolonged measures.   Patient is single lives at home alone, has 1 child Patient is right handed Education level is 1 year of college Caffeine consumption is 2 cups daily  Breakfast: lance crackers and coffee Lunch:  Breakfast food Dinner : Corporate treasurer out ...   Dr.  Carlynn Purl eye doctor every year.  Thank you for taking time to come for your Medicare Wellness Visit. I appreciate your ongoing commitment to your health goals. Please review the following plan we discussed and let me know if I can assist you in the future.  Remi Haggard LPN

## 2018-05-26 ENCOUNTER — Other Ambulatory Visit: Payer: Self-pay

## 2018-05-26 ENCOUNTER — Ambulatory Visit (INDEPENDENT_AMBULATORY_CARE_PROVIDER_SITE_OTHER): Payer: Medicare Other | Admitting: Emergency Medicine

## 2018-05-26 ENCOUNTER — Encounter: Payer: Self-pay | Admitting: Emergency Medicine

## 2018-05-26 VITALS — BP 107/69 | HR 61 | Temp 98.5°F | Ht 66.0 in | Wt 257.8 lb

## 2018-05-26 DIAGNOSIS — Z0001 Encounter for general adult medical examination with abnormal findings: Secondary | ICD-10-CM

## 2018-05-26 DIAGNOSIS — R7302 Impaired glucose tolerance (oral): Secondary | ICD-10-CM | POA: Diagnosis not present

## 2018-05-26 DIAGNOSIS — I10 Essential (primary) hypertension: Secondary | ICD-10-CM

## 2018-05-26 DIAGNOSIS — Z23 Encounter for immunization: Secondary | ICD-10-CM | POA: Diagnosis not present

## 2018-05-26 DIAGNOSIS — R739 Hyperglycemia, unspecified: Secondary | ICD-10-CM

## 2018-05-26 DIAGNOSIS — Z87898 Personal history of other specified conditions: Secondary | ICD-10-CM

## 2018-05-26 NOTE — Progress Notes (Addendum)
[name] [name] 68 y.o.   Chief Complaint  Patient presents with  . Annual Exam    CPE  . Medication Refill    HISTORY OF PRESENT ILLNESS: This is a 68 y.o. female here for annual exam.  Has no complaints or medical concerns. Has a history of hypertension, seizure disorder, taking medication for these.  States that she does not need refills at this time.  BP Readings from Last 3 Encounters:  05/26/18 107/69  05/18/18 118/78  04/12/18 123/61    HPI   Prior to Admission medications   Medication Sig Start Date End Date Taking? Authorizing Provider  aspirin 81 MG chewable tablet Chew 81 mg by mouth daily.   Yes [provider]  carbamazepine (TEGRETOL) 200 MG tablet Take 1 tablet (200 mg total) by mouth 2 (two) times daily. 04/12/18  Yes Kathrynn Ducking, MD  carvedilol (COREG) 3.125 MG tablet TAKE 1 TABLET(3.125 MG) BY MOUTH TWICE DAILY WITH A MEAL 11/23/17  Yes Wardell Honour, MD  cholecalciferol (VITAMIN D3) 25 MCG (1000 UT) tablet Take 1,000 Units by mouth daily.   Yes [provider]  escitalopram (LEXAPRO) 10 MG tablet Take 1 tablet (10 mg total) by mouth at bedtime. 11/23/17  Yes Wardell Honour, MD  levETIRAcetam (KEPPRA) 750 MG tablet Take 750 mg by mouth. 2 in the morning 2 in the evening.   Yes [provider]  lisinopril-hydrochlorothiazide (PRINZIDE,ZESTORETIC) 20-25 MG tablet Take 1 tablet by mouth daily. 11/23/17  Yes Wardell Honour, MD  Multiple Vitamins-Minerals (MULTIVITAMIN PO) Take 1 tablet by mouth daily.   Yes [provider]  simvastatin (ZOCOR) 20 MG tablet Take 1 tablet (20 mg total) by mouth daily. 11/23/17  Yes Wardell Honour, MD    No Known Allergies  Patient Active Problem List   Diagnosis Date Noted  . Staring episodes 11/24/2017  . OSA (obstructive sleep apnea) 11/30/2016  . Episodic altered awareness 10/13/2016  . Chronic insomnia 10/13/2016  . Near syncope 08/26/2016  . Transient cerebral ischemia  08/26/2016  . Transient memory loss 08/26/2016  . Daytime sleepiness 08/26/2016  . Morbid obesity ([place]) 05/31/2016  . Glucose intolerance (impaired glucose tolerance) 05/18/2016  . 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 Generalized anxiety disorder 03/15/2014  . Memory difficulty 03/14/2014  . Debility 02/01/2014  . Demand myocardial ischemic related infarction 01/23/2014    Class: Diagnosis of  . Bacterial meningitis-01/18/2014-s.pneumo 01/18/2014  . Depression, reactive 10/12/2012  . Chronic neutropenia ([place]) 04/14/2012  . Hyperlipemia 10/20/2011  . HTN (hypertension) 10/20/2011    Past Medical History:  Diagnosis Date  . Anxiety   . Cataract   . Chronic insomnia 10/13/2016  . Depression   . Gait disorder 03/14/2014  . HTN (hypertension)   . Hx of cardiovascular stress test    Lexiscan Myoview (1/16):  No ischemia, EF 68%, breast atten; Low Risk  . Hyperlipemia   . Memory difficulty 03/14/2014  . Meningitis due to bacteria 01-18-14  . OSA (obstructive sleep apnea) 11/30/2016  . TIA (transient ischemic attack)     Past Surgical History:  Procedure Laterality Date  . NO PAST SURGERIES      Social History   Socioeconomic History  . Marital status: Divorced    Spouse name: Not on file  . Number of children: 1  . Years of education: college  . Highest education level: Some college, no degree  Occupational History  . Occupation: Retired Scientist, research (physical sciences):  [employer]  Social Needs  . Financial resource strain: Not hard at all  . Food insecurity:    Worry: Never true    Inability: Never true  . Transportation needs:    Medical: Yes    Non-medical: Yes  Tobacco Use  . Smoking status: Former Smoker    Packs/day: 0.50    Years: 40.00    Pack years: 20.00    Types: Cigarettes    Last attempt to quit: 07/09/2011    Years since quitting: 6.8  . Smokeless tobacco: Never Used  Substance and Sexual Activity  . Alcohol use: Yes    Alcohol/week: 0.0 standard drinks     Comment: Rare  . Drug use: No  . Sexual activity: Never    Birth control/protection: Abstinence  Lifestyle  . Physical activity:    Days per week: 0 days    Minutes per session: 0 min  . Stress: To some extent  Relationships  . Social connections:    Talks on phone: More than three times a week    Gets together: Never    Attends religious service: More than 4 times per year    Active member of club or organization: No    Attends meetings of clubs or organizations: Never    Relationship status: Divorced  . Intimate partner violence:    Fear of current or ex partner: No    Emotionally abused: No    Physically abused: No    Forced sexual activity: No  Other Topics Concern  . Not on file  Social History Narrative   Marital status: divorced x 35 years; not dating; not interested in 2019.      Children: 1 child/son (44); 2 grandchildren; [place]      Lives:  Alone in [place] in [place].      Employment: retired 2008; Engineer, technical sales.      Tobacco: quit smoking in 2013.  Smoked x many years       Alcohol: none; holidays only      Exercise: none; quit exercising two years ago in 2015.        Seatbelt: 100%; no texting. Does not drive in [place] due to seizure activity.       ADLs: no driving in 6789[place]3810 due to seizure activity; independent with ADLs.  Has cane for long distances      Advanced Directives: none; [name]/sister.  FULL CODE no prolonged measures.         Patient is single lives at home alone, has 1 child   Patient is right handed   Education level is 1 year of college   Caffeine consumption is 2 cups daily    Family History  Problem Relation Age of Onset  . Heart attack Mother 33  . Stroke Mother   . Hypertension Mother   . Heart attack Father 42  . Cerebral aneurysm Sister   . Hypertension Sister   . Kidney disease Sister        ESRD; HD in last year  . Hypertension Brother   . Hyperlipidemia Brother   . Rheum arthritis Sister    . Arthritis Sister   . Hypertension Brother   . Hyperlipidemia Brother   . Hyperlipidemia Brother   . Hypertension Brother      Review of Systems  Constitutional: Negative.  Negative for chills and fever.  HENT: Negative.  Negative for congestion, hearing loss, nosebleeds and sore throat.   Eyes: Negative.  Negative for blurred  vision and double vision.  Respiratory: Negative.  Negative for cough and shortness of breath.   Cardiovascular: Negative.  Negative for chest pain and palpitations.  Gastrointestinal: Negative.  Negative for abdominal pain, diarrhea, nausea and vomiting.  Genitourinary: Negative.   Musculoskeletal: Negative.   Skin: Negative.  Negative for rash.  Neurological: Positive for seizures ([place] seizure, last one 6 months ago). Negative for dizziness, speech change, focal weakness, loss of consciousness and headaches.  All other systems reviewed and are negative.   Vitals:   05/26/18 0913  BP: 107/69  Pulse: 61  Temp: 98.5 F (36.9 C)  SpO2: 95%    Physical Exam Vitals signs reviewed.  Constitutional:      Appearance: Normal appearance.  HENT:     Head: Normocephalic and atraumatic.     Nose: Nose normal.     Mouth/Throat:     Mouth: Mucous membranes are moist.     Pharynx: Oropharynx is clear.  Eyes:     Extraocular Movements: Extraocular movements intact.     Conjunctiva/sclera: Conjunctivae normal.     Pupils: Pupils are equal, round, and reactive to light.  Neck:     Musculoskeletal: Normal range of motion and neck supple.  Cardiovascular:     Rate and Rhythm: Normal rate and regular rhythm.     Heart sounds: Normal heart sounds.  Pulmonary:     Effort: Pulmonary effort is normal.     Breath sounds: Normal breath sounds.  Abdominal:     General: There is no distension.     Palpations: Abdomen is soft.     Tenderness: There is no abdominal tenderness.  Musculoskeletal: Normal range of motion.  Skin:    General: Skin is warm and  dry.     Capillary Refill: Capillary refill takes less than 2 seconds.  Neurological:     General: No focal deficit present.     Mental Status: She is alert and oriented to person, place, and time.     Sensory: No sensory deficit.     Motor: No weakness.  Psychiatric:        Mood and Affect: Mood normal.        Behavior: Behavior normal.      ASSESSMENT & PLAN: Orene was seen today for annual exam and medication refill.  Diagnoses and all orders for this visit:  Essential hypertension -     Lipid panel -     CMP14+EGFR -     TSH  Glucose intolerance (impaired glucose tolerance) -     Hemoglobin A1c  Hyperglycemia -     Hemoglobin A1c -     TSH  Need for vaccination -     Pneumococcal polysaccharide vaccine 23-valent greater than or equal to 2yo subcutaneous/IM  History of seizures    Patient Instructions  Health Maintenance After Age 68 After age 81, you are at a higher risk for certain long-term diseases and infections as well as injuries from falls. Falls are a major cause of broken bones and head injuries in people who are older than age 49. Getting regular preventive care can help to keep you healthy and well. Preventive care includes getting regular testing and making lifestyle changes as recommended by your health care provider. Talk with your health care provider about:  Which screenings and tests you should have. A screening is a test that checks for a disease when you have no symptoms.  A diet and exercise plan that is right for you.  What should I know about screenings and tests to prevent falls? Screening and testing are the best ways to find a health problem early. Early diagnosis and treatment give you the best chance of managing medical conditions that are common after age 33. Certain conditions and lifestyle choices may make you more likely to have a fall. Your health care provider may recommend:  Regular vision checks. Poor vision and conditions such as  cataracts can make you more likely to have a fall. If you wear glasses, make sure to get your prescription updated if your vision changes.  Medicine review. Work with your health care provider to regularly review all of the medicines you are taking, including over-the-counter medicines. Ask your health care provider about any side effects that may make you more likely to have a fall. Tell your health care provider if any medicines that you take make you feel dizzy or sleepy.  Osteoporosis screening. Osteoporosis is a condition that causes the bones to get weaker. This can make the bones weak and cause them to break more easily.  Blood pressure screening. Blood pressure changes and medicines to control blood pressure can make you feel dizzy.  Strength and balance checks. Your health care provider may recommend certain tests to check your strength and balance while standing, walking, or changing positions.  Foot health exam. Foot pain and numbness, as well as not wearing proper footwear, can make you more likely to have a fall.  Depression screening. You may be more likely to have a fall if you have a fear of falling, feel emotionally low, or feel unable to do activities that you used to do.  Alcohol use screening. Using too much alcohol can affect your balance and may make you more likely to have a fall. What actions can I take to lower my risk of falls? General instructions  Talk with your health care provider about your risks for falling. Tell your health care provider if: ? You fall. Be sure to tell your health care provider about all falls, even ones that seem minor. ? You feel dizzy, sleepy, or off-balance.  Take over-the-counter and prescription medicines only as told by your health care provider. These include any supplements.  Eat a healthy diet and maintain a healthy weight. A healthy diet includes low-fat dairy products, low-fat (lean) meats, and fiber from whole grains, beans, and  lots of fruits and vegetables. Home safety  Remove any tripping hazards, such as rugs, cords, and clutter.  Install safety equipment such as grab bars in bathrooms and safety rails on stairs.  Keep rooms and walkways well-lit. Activity   Follow a regular exercise program to stay fit. This will help you maintain your balance. Ask your health care provider what types of exercise are appropriate for you.  If you need a cane or walker, use it as recommended by your health care provider.  Wear supportive shoes that have nonskid soles. Lifestyle  Do not drink alcohol if your health care provider tells you not to drink.  If you drink alcohol, limit how much you have: ? 0-1 drink a day for women. ? 0-2 drinks a day for men.  Be aware of how much alcohol is in your drink. In the [place] one drink equals one typical bottle of beer (12 oz), one-half glass of wine (5 oz), or one shot of hard liquor (1 oz).  Do not use any products that contain nicotine or tobacco, such as cigarettes and e-cigarettes. If you  need help quitting, ask your health care provider. Summary  Having a healthy lifestyle and getting preventive care can help to protect your health and wellness after age 38.  Screening and testing are the best way to find a health problem early and help you avoid having a fall. Early diagnosis and treatment give you the best chance for managing medical conditions that are more common for people who are older than age 67.  Falls are a major cause of broken bones and head injuries in people who are older than age 59. Take precautions to prevent a fall at home.  Work with your health care provider to learn what changes you can make to improve your health and wellness and to prevent falls. This information is not intended to replace advice given to you by your health care provider. Make sure you discuss any questions you have with your health care provider. Document Released: 03/02/2017  Document Revised: 03/02/2017 Document Reviewed: 03/02/2017 Elsevier Interactive Patient Education  2019 Elsevier Inc.      Agustina Caroli, MD [facility]

## 2018-05-26 NOTE — Patient Instructions (Signed)
Health Maintenance After Age 68 After age 68, you are at a higher risk for certain long-term diseases and infections as well as injuries from falls. Falls are a major cause of broken bones and head injuries in people who are older than age 68. Getting regular preventive care can help to keep you healthy and well. Preventive care includes getting regular testing and making lifestyle changes as recommended by your health care provider. Talk with your health care provider about:  Which screenings and tests you should have. A screening is a test that checks for a disease when you have no symptoms.  A diet and exercise plan that is right for you. What should I know about screenings and tests to prevent falls? Screening and testing are the best ways to find a health problem early. Early diagnosis and treatment give you the best chance of managing medical conditions that are common after age 68. Certain conditions and lifestyle choices may make you more likely to have a fall. Your health care provider may recommend:  Regular vision checks. Poor vision and conditions such as cataracts can make you more likely to have a fall. If you wear glasses, make sure to get your prescription updated if your vision changes.  Medicine review. Work with your health care provider to regularly review all of the medicines you are taking, including over-the-counter medicines. Ask your health care provider about any side effects that may make you more likely to have a fall. Tell your health care provider if any medicines that you take make you feel dizzy or sleepy.  Osteoporosis screening. Osteoporosis is a condition that causes the bones to get weaker. This can make the bones weak and cause them to break more easily.  Blood pressure screening. Blood pressure changes and medicines to control blood pressure can make you feel dizzy.  Strength and balance checks. Your health care provider may recommend certain tests to check your  strength and balance while standing, walking, or changing positions.  Foot health exam. Foot pain and numbness, as well as not wearing proper footwear, can make you more likely to have a fall.  Depression screening. You may be more likely to have a fall if you have a fear of falling, feel emotionally low, or feel unable to do activities that you used to do.  Alcohol use screening. Using too much alcohol can affect your balance and may make you more likely to have a fall. What actions can I take to lower my risk of falls? General instructions  Talk with your health care provider about your risks for falling. Tell your health care provider if: ? You fall. Be sure to tell your health care provider about all falls, even ones that seem minor. ? You feel dizzy, sleepy, or off-balance.  Take over-the-counter and prescription medicines only as told by your health care provider. These include any supplements.  Eat a healthy diet and maintain a healthy weight. A healthy diet includes low-fat dairy products, low-fat (lean) meats, and fiber from whole grains, beans, and lots of fruits and vegetables. Home safety  Remove any tripping hazards, such as rugs, cords, and clutter.  Install safety equipment such as grab bars in bathrooms and safety rails on stairs.  Keep rooms and walkways well-lit. Activity   Follow a regular exercise program to stay fit. This will help you maintain your balance. Ask your health care provider what types of exercise are appropriate for you.  If you need a cane or   walker, use it as recommended by your health care provider.  Wear supportive shoes that have nonskid soles. Lifestyle  Do not drink alcohol if your health care provider tells you not to drink.  If you drink alcohol, limit how much you have: ? 0-1 drink a day for women. ? 0-2 drinks a day for men.  Be aware of how much alcohol is in your drink. In the [place] one drink equals one typical bottle of beer (12  oz), one-half glass of wine (5 oz), or one shot of hard liquor (1 oz).  Do not use any products that contain nicotine or tobacco, such as cigarettes and e-cigarettes. If you need help quitting, ask your health care provider. Summary  Having a healthy lifestyle and getting preventive care can help to protect your health and wellness after age 68.  Screening and testing are the best way to find a health problem early and help you avoid having a fall. Early diagnosis and treatment give you the best chance for managing medical conditions that are more common for people who are older than age 68.  Falls are a major cause of broken bones and head injuries in people who are older than age 68. Take precautions to prevent a fall at home.  Work with your health care provider to learn what changes you can make to improve your health and wellness and to prevent falls. This information is not intended to replace advice given to you by your health care provider. Make sure you discuss any questions you have with your health care provider. Document Released: 03/02/2017 Document Revised: 03/02/2017 Document Reviewed: 03/02/2017 Elsevier Interactive Patient Education  2019 Elsevier Inc.  

## 2018-05-27 LAB — LIPID PANEL
Chol/HDL Ratio: 3 ratio (ref 0.0–4.4)
Cholesterol, Total: 232 mg/dL — ABNORMAL HIGH (ref 100–199)
HDL: 78 mg/dL (ref >39)
LDL Calculated: 133 mg/dL — ABNORMAL HIGH (ref 0–99)
Triglycerides: 104 mg/dL (ref 0–149)
VLDL CHOLESTEROL CAL: 21 mg/dL (ref 5–40)

## 2018-05-27 LAB — CMP14+EGFR
ALT: 16 IU/L (ref 0–32)
AST: 16 IU/L (ref 0–40)
Albumin/Globulin Ratio: 2 (ref 1.2–2.2)
Albumin: 4.4 g/dL (ref 3.8–4.8)
Alkaline Phosphatase: 115 IU/L (ref 39–117)
BUN/Creatinine Ratio: 18 (ref 12–28)
BUN: 17 mg/dL (ref 8–27)
Bilirubin Total: 0.3 mg/dL (ref 0.0–1.2)
CO2: 23 mmol/L (ref 20–29)
Calcium: 9.2 mg/dL (ref 8.7–10.3)
Chloride: 101 mmol/L (ref 96–106)
Creatinine, Ser: 0.92 mg/dL (ref 0.57–1.00)
GFR calc Af Amer: 75 mL/min/{1.73_m2} (ref >59)
GFR, EST NON AFRICAN AMERICAN: 65 mL/min/{1.73_m2} (ref >59)
GLOBULIN, TOTAL: 2.2 g/dL (ref 1.5–4.5)
Glucose: 92 mg/dL (ref 65–99)
Potassium: 3.9 mmol/L (ref 3.5–5.2)
SODIUM: 142 mmol/L (ref 134–144)
Total Protein: 6.6 g/dL (ref 6.0–8.5)

## 2018-05-27 LAB — TSH: TSH: 2.57 u[IU]/mL (ref 0.450–4.500)

## 2018-05-27 LAB — HEMOGLOBIN A1C
Est. average glucose Bld gHb Est-mCnc: 126 mg/dL
Hgb A1c MFr Bld: 6 % — ABNORMAL HIGH (ref 4.8–5.6)

## 2018-05-29 ENCOUNTER — Encounter: Payer: Self-pay | Admitting: *Deleted

## 2018-07-05 ENCOUNTER — Other Ambulatory Visit: Payer: Self-pay | Admitting: Emergency Medicine

## 2018-07-05 DIAGNOSIS — I10 Essential (primary) hypertension: Secondary | ICD-10-CM

## 2018-07-10 ENCOUNTER — Other Ambulatory Visit: Payer: Self-pay | Admitting: Emergency Medicine

## 2018-07-10 NOTE — Telephone Encounter (Signed)
Requested Prescriptions  Pending Prescriptions Disp Refills  . escitalopram (LEXAPRO) 10 MG tablet [Pharmacy Med Name: ESCITALOPRAM 10MG  TABLETS] 90 tablet 1    Sig: TAKE 1 TABLET(10 MG) BY MOUTH AT BEDTIME     Psychiatry:  Antidepressants - SSRI Passed - 07/10/2018  3:39 PM      Passed - Completed PHQ-2 or PHQ-9 in the last 360 days.      Passed - Valid encounter within last 6 months    Recent Outpatient Visits          1 month ago Essential hypertension   [facility] at [facility], [place], [place]   1 month ago Medicare annual wellness visit, subsequent   Primary Care at [facility], Eilleen Kempf, MD   7 months ago Essential hypertension   Primary Care at [facility], Myrle Sheng, MD   1 year ago Routine physical examination   Primary Care at [facility], Myrle Sheng, MD   1 year ago Essential hypertension   Primary Care at [facility], Myrle Sheng, MD

## 2018-07-18 ENCOUNTER — Other Ambulatory Visit: Payer: Self-pay | Admitting: Emergency Medicine

## 2018-07-18 DIAGNOSIS — Z1231 Encounter for screening mammogram for malignant neoplasm of breast: Secondary | ICD-10-CM

## 2018-07-24 ENCOUNTER — Ambulatory Visit: Payer: Medicare Other | Admitting: Neurology

## 2018-07-24 ENCOUNTER — Telehealth: Payer: Self-pay | Admitting: Neurology

## 2018-07-24 NOTE — Telephone Encounter (Signed)
I reviewed patient's CPAP compliance data from the past 30 days from 06/24/2018 through 07/23/2018, during which time she used her machine every night with percent used days greater than 4 hours at 100%, indicating superb compliance with an average usage of 7 hours and 54 minutes which is excellent, residual AHI at goal at 0.9 per hour, leak acceptable with the 95th percentile at 8.4 L/m on a pressure of 14 cm. The patient is doing well and her settings look very adequate, compliance looks excellent, very reasonable to delay her yearly appointment at this time until June. Thanks for talking to patient, nothing further needed.

## 2018-07-24 NOTE — Telephone Encounter (Signed)
Pt called in asking to r/s her appt for tomorrow, I was asked to take the call. Pt reports that she is sleeping well with pt's cpap. Pt has no concerns. I advised her that I will ask Dr. Frances Furbish to review her download and if there is something we need to discuss with her, we will call her back. Otherwise, pt was agreeable to an appt on 10/31/18 at 1:00pm. Pt verbalized understanding of new appt date and time and recommendations.

## 2018-07-25 ENCOUNTER — Ambulatory Visit: Payer: Medicare Other | Admitting: Adult Health

## 2018-07-25 ENCOUNTER — Ambulatory Visit: Payer: Medicare Other | Admitting: Neurology

## 2018-08-10 ENCOUNTER — Ambulatory Visit: Payer: Medicare Other

## 2018-09-06 ENCOUNTER — Other Ambulatory Visit: Payer: Self-pay | Admitting: Neurology

## 2018-09-28 ENCOUNTER — Ambulatory Visit
Admission: RE | Admit: 2018-09-28 | Discharge: 2018-09-28 | Disposition: A | Discharge status: Home / Self Care | Payer: Medicare Other | Source: Ambulatory Visit | Attending: Emergency Medicine | Admitting: Emergency Medicine

## 2018-09-28 ENCOUNTER — Other Ambulatory Visit: Payer: Self-pay

## 2018-09-28 ENCOUNTER — Encounter: Payer: Self-pay | Admitting: Emergency Medicine

## 2018-09-28 DIAGNOSIS — Z1231 Encounter for screening mammogram for malignant neoplasm of breast: Secondary | ICD-10-CM

## 2018-10-04 ENCOUNTER — Other Ambulatory Visit: Payer: Self-pay | Admitting: Neurology

## 2018-10-10 ENCOUNTER — Telehealth: Payer: Self-pay | Admitting: Neurology

## 2018-10-10 NOTE — Telephone Encounter (Signed)
Pt states she does not have a computer and does not have a smart phone. She would like to know if she can do a Televisit. Please advise.

## 2018-10-10 NOTE — Telephone Encounter (Signed)
Spoke to pt.  Consented to [place] visit.  (will try sending text with llink).  Due to current COVID 19 pandemic, our office is severely reducing in office visits until further notice, in order to minimize the risk to our patients and healthcare providers.  Pt understands that although there may be some limitations with this type of visit, we will take all precautions to reduce any security or privacy concerns.  Pt understands that this will be treated like an in office visit and we will file with pt's insurance, and there may be a patient responsible charge related to this service.  Text sent [email or web address].

## 2018-10-12 ENCOUNTER — Ambulatory Visit (INDEPENDENT_AMBULATORY_CARE_PROVIDER_SITE_OTHER): Payer: Medicare Other | Admitting: Neurology

## 2018-10-12 ENCOUNTER — Other Ambulatory Visit: Payer: Self-pay

## 2018-10-12 ENCOUNTER — Encounter: Payer: Self-pay | Admitting: Neurology

## 2018-10-12 DIAGNOSIS — R404 Transient alteration of awareness: Secondary | ICD-10-CM

## 2018-10-12 MED ORDER — CARBAMAZEPINE 200 MG PO TABS: 200.0000 mg | ORAL_TABLET | Freq: Two times a day (BID) | ORAL | 3 refills | Status: DC

## 2018-10-12 MED ORDER — LEVETIRACETAM 750 MG PO TABS: ORAL_TABLET | ORAL | 1 refills | Status: DC

## 2018-10-12 NOTE — Progress Notes (Signed)
    Virtual Visit via Telephone Note  I connected with [name] [name] on 10/12/18 at  8:45 AM EDT by telephone and verified that I am speaking with the correct person using two identifiers.   I discussed the limitations, risks, security and privacy concerns of performing an evaluation and management service by telephone and the availability of in person appointments. I also discussed with the patient that there may be a patient responsible charge related to this service. The patient expressed understanding and agreed to proceed.   History of Present Illness: 10/12/2018 SS: [name] [name] is a 68 year old female with history of episodes of staring off, likely representing seizures.  Currently taking carbamazepine 200 mg twice daily, Keppra 750 mg tablets, 2 tablets twice daily.  She remains on vitamin D supplementation while taking carbamazepine.  She reports she is tolerating medications without problem. Her last seizure episode was in May 2019. She has not had any further events. She is living alone and driving a car without difficulty.  She denies any new problems or concerns.  She reports she does need a refill on her medications. She reports compliance with her medications.   04/12/2018 Dr.Willis: [name] [name] is a 68 year old right-handed female with a history of episodes of staring off that are likely representing seizures.  The patient has been on a combination of Keppra and carbamazepine, the carbamazepine was added in June 2019.  Since that time, the patient has had no observed episodes.  Unfortunately, the patient may not know whether she has had an episode if she is by herself.  The patient is tolerating the medications quite well.  She tends to run a low white blood count, but this has been stable on 2 blood draws, her liver enzymes are normal on carbamazepine.  She is on 400 mg daily of carbamazepine, her last blood level was 7.  The patient returns to this office for an evaluation.    Observations/Objective: Telephone call, is alert and oriented, good historian   Assessment and Plan: 1.  Episodes of transient alteration of awareness, probable seizures  [name] [name] is a very pleasant 68 year old female.  Fortunately, she has not had any recurrent seizure events.  I will check lab work today.  She will come the office in the next 1 to 2 weeks to have this done.  I will send in a refill of her medications.  She will follow-up in 6 months or sooner if needed.  She will let me know of any recurrent seizures.  Follow Up Instructions: 6 months 04/18/2019 11:15   I discussed the assessment and treatment plan with the patient. The patient was provided an opportunity to ask questions and all were answered. The patient agreed with the plan and demonstrated an understanding of the instructions.   The patient was advised to call back or seek an in-person evaluation if the symptoms worsen or if the condition fails to improve as anticipated.  I provided 25 minutes of non-face-to-face time during this encounter.  Evangeline Dakin, DNP  [facility] [address] [place], [place] [address] [phone]

## 2018-10-12 NOTE — Progress Notes (Signed)
I have read the note, and I agree with the clinical assessment and plan.  Charles K Willis   

## 2018-10-16 NOTE — Addendum Note (Signed)
Addended by: Inis Sizer D on: 10/16/2018 10:34 AM   Modules accepted: Orders

## 2018-10-17 ENCOUNTER — Other Ambulatory Visit: Payer: Self-pay | Admitting: Emergency Medicine

## 2018-10-18 LAB — CBC WITH DIFFERENTIAL/PLATELET
Basophils Absolute: 0 10*3/uL (ref 0.0–0.2)
Basos: 0 %
EOS (ABSOLUTE): 0.1 10*3/uL (ref 0.0–0.4)
Eos: 4 %
Hematocrit: 36.7 % (ref 34.0–46.6)
Hemoglobin: 11.6 g/dL (ref 11.1–15.9)
Immature Grans (Abs): 0 10*3/uL (ref 0.0–0.1)
Immature Granulocytes: 0 %
Lymphocytes Absolute: 0.9 10*3/uL (ref 0.7–3.1)
Lymphs: 26 %
MCH: 25.8 pg — ABNORMAL LOW (ref 26.6–33.0)
MCHC: 31.6 g/dL (ref 31.5–35.7)
MCV: 82 fL (ref 79–97)
Monocytes Absolute: 0.5 10*3/uL (ref 0.1–0.9)
Monocytes: 15 %
Neutrophils Absolute: 1.9 10*3/uL (ref 1.4–7.0)
Neutrophils: 55 %
Platelets: 226 10*3/uL (ref 150–450)
RBC: 4.5 x10E6/uL (ref 3.77–5.28)
RDW: 12.6 % (ref 11.7–15.4)
WBC: 3.4 10*3/uL (ref 3.4–10.8)

## 2018-10-18 LAB — COMPREHENSIVE METABOLIC PANEL
ALT: 15 IU/L (ref 0–32)
AST: 16 IU/L (ref 0–40)
Albumin/Globulin Ratio: 2.3 — ABNORMAL HIGH (ref 1.2–2.2)
Albumin: 4.4 g/dL (ref 3.8–4.8)
Alkaline Phosphatase: 113 IU/L (ref 39–117)
BUN/Creatinine Ratio: 20 (ref 12–28)
BUN: 20 mg/dL (ref 8–27)
Bilirubin Total: 0.3 mg/dL (ref 0.0–1.2)
CO2: 26 mmol/L (ref 20–29)
Calcium: 9.1 mg/dL (ref 8.7–10.3)
Chloride: 104 mmol/L (ref 96–106)
Creatinine, Ser: 1.01 mg/dL — ABNORMAL HIGH (ref 0.57–1.00)
GFR calc Af Amer: 67 mL/min/{1.73_m2} (ref >59)
GFR calc non Af Amer: 58 mL/min/{1.73_m2} — ABNORMAL LOW (ref >59)
Globulin, Total: 1.9 g/dL (ref 1.5–4.5)
Glucose: 96 mg/dL (ref 65–99)
Potassium: 4.3 mmol/L (ref 3.5–5.2)
Sodium: 142 mmol/L (ref 134–144)
Total Protein: 6.3 g/dL (ref 6.0–8.5)

## 2018-10-18 LAB — CARBAMAZEPINE LEVEL, TOTAL: Carbamazepine (Tegretol), S: 6.3 ug/mL (ref 4.0–12.0)

## 2018-10-18 LAB — LEVETIRACETAM LEVEL: Levetiracetam Lvl: 24.6 ug/mL (ref 10.0–40.0)

## 2018-10-19 ENCOUNTER — Telehealth: Payer: Self-pay

## 2018-10-19 NOTE — Telephone Encounter (Signed)
Spoke with the patient and she verbalized understanding her results. No questions or concerns at this time.   

## 2018-10-19 NOTE — Telephone Encounter (Signed)
-----   Message from Suzzanne Cloud, NP sent at 10/19/2018  7:25 AM EDT ----- Please call the patient. Lab work looks good. Good level of Keppra and Carbamazepine.

## 2018-10-24 ENCOUNTER — Telehealth: Payer: Self-pay | Admitting: Neurology

## 2018-10-24 ENCOUNTER — Other Ambulatory Visit: Payer: Self-pay

## 2018-10-24 ENCOUNTER — Telehealth: Payer: Self-pay | Admitting: Emergency Medicine

## 2018-10-24 DIAGNOSIS — I10 Essential (primary) hypertension: Secondary | ICD-10-CM

## 2018-10-24 DIAGNOSIS — E7800 Pure hypercholesterolemia, unspecified: Secondary | ICD-10-CM

## 2018-10-24 DIAGNOSIS — R739 Hyperglycemia, unspecified: Secondary | ICD-10-CM

## 2018-10-24 NOTE — Telephone Encounter (Signed)
Spoke with pt and advised labs has been placed for her appointment.

## 2018-10-24 NOTE — Telephone Encounter (Signed)
Due to current COVID 19 pandemic, our office is severely reducing in office visits until further notice, in order to minimize the risk to our patients and healthcare providers.   I called patient to offer a virtual visit for her appt. Patient does not have the ability to do a virtual visit, but patient agrees to come in office for a 6/24 appt. Patient verbalized understanding of the precautions our office is taking for in office visits.

## 2018-10-24 NOTE — Telephone Encounter (Signed)
Thanks

## 2018-10-24 NOTE — Telephone Encounter (Signed)
Copied from [place] [id number]. Topic: General - Other >> Oct 24, 2018  1:22 PM Sheran Luz wrote: Patient was advised at last OV that she could walk in to get routine lab work. No orders in system. Please contact patient directly when orders are placed for scheduling.

## 2018-10-25 ENCOUNTER — Ambulatory Visit: Payer: Medicare Other | Admitting: Neurology

## 2018-10-25 ENCOUNTER — Other Ambulatory Visit: Payer: Self-pay

## 2018-10-25 ENCOUNTER — Encounter: Payer: Self-pay | Admitting: Neurology

## 2018-10-25 VITALS — BP 117/70 | HR 56 | Ht 66.0 in | Wt 270.0 lb

## 2018-10-25 DIAGNOSIS — G4733 Obstructive sleep apnea (adult) (pediatric): Secondary | ICD-10-CM

## 2018-10-25 DIAGNOSIS — Z9989 Dependence on other enabling machines and devices: Secondary | ICD-10-CM

## 2018-10-25 NOTE — Patient Instructions (Addendum)
It was good to see you again!  Please continue using your CPAP regularly. While your insurance requires that you use CPAP at least 4 hours each night on 70% of the nights, I recommend, that you not skip any nights and use it throughout the night if you can. Getting used to CPAP and staying with the treatment long term does take time and patience and discipline. Untreated obstructive sleep apnea when it is moderate to severe can have an adverse impact on cardiovascular health and raise her risk for heart disease, arrhythmias, hypertension, congestive heart failure, stroke and diabetes. Untreated obstructive sleep apnea causes sleep disruption, nonrestorative sleep, and sleep deprivation. This can have an impact on your day to day functioning and cause daytime sleepiness and impairment of cognitive function, memory loss, mood disturbance, and problems focussing. Using CPAP regularly can improve these symptoms.  Keep up the good work! I will see you back in 1 year!  As discussed, we will reduce your pressure to 13 cm, please call our office in about 6 weeks so we can pull a 30-day compliance report again.  You can also sign up for the my airview app on your cell phone.

## 2018-10-25 NOTE — Progress Notes (Signed)
Order for cpap pressure change sent to [facility] via community message. Confirmation received that the order transmitted was successful.  

## 2018-10-25 NOTE — Progress Notes (Signed)
Subjective:    Patient ID: [name] [name] is a 68 y.o. female.  HPI     Interim history:   [name] [name] is a 68 year old right-handed woman with an underlying medical history of anxiety, depression, hypertension, hyperlipidemia, history of meningitis in 2015, anemia, gait disorder, memory loss, prior history of smoking, and morbid obesity, who presents for follow-up consultation of her obstructive sleep apnea, on CPAP therapy, for her yearly follow-up.  The patient is unaccompanied today.  I last saw her on 07/19/2017, at which time she was fully compliant with CPAP and doing well.  She was advised to follow-up in 1 year.   Today, 10/25/2018: I reviewed her CPAP compliance data from 09/24/2018 through 10/23/2018 which is a total of 30 days, during which time she used her machine every night with percent used days greater than 4 hours at 100%, indicating superb compliance with an average usage of 7 hours and 48 minutes, residual AHI at goal at 0.9/h, leak on the higher end with a 95th percentile at 19.1 L/min on a pressure of 14 cm.    She reports that generally CPAP is going well.  She does struggle with the headgear sliding off at times and sometimes has to tighten it so hard that the mask presses on her face and leaves a mark.  She uses a fullface mask and has tried a nasal mask before but opens her mouth at night.  She did get new supplies.  She has gained weight in the past 6 months since she has been home bound more.  She tries to hydrate well.  She does not always drink enough water she admits.  Thankfully, she has been seizure-free and has been able to drive since December 2019.   The patient's allergies, current medications, family history, past medical history, past social history, past surgical history and problem list were reviewed and updated as appropriate.    Previously (copied from previous notes for reference):   I saw her on 01/19/2017, at which time we talked about her sleep  study results. She was compliant with CPAP therapy at the time and indicated good results. She had recently started Keppra per Dr. Jannifer Franklin and had some daytime somnolence from it. She was not driving at the time. She was advised to continue to be fully compliant with CPAP therapy and follow-up for sleep apnea routinely in 6 months with me.    She was seen in the interim by Dr. Jannifer Franklin on 06/28/2017 at which time she was advised to continue with Keppra and follow-up in 6 months.   I reviewed her CPAP compliance data from 06/18/2017 through 07/17/2017 which is a total of 30 days, during which time she used her CPAP every night with percent used days greater than 4 hours at 100%, indicating superb compliance with an average usage of 7 hours and 36 minutes, residual AHI at goal at 0.8 per hour, leak acceptable with the 95th percentile at 7.1 L/m on a pressure of 14 cm.    I first met her on 09/28/2016 at the request of her primary care physician, at which time she reported recent spells of decreased awareness, snoring, daytime somnolence. She was advised to make a follow-up appointment with Dr. Jannifer Franklin who had previously followed her after her diagnosis of meningitis in 2015. I suggested we proceed with EEG testing as well as sleep study testing. She had a baseline sleep study, followed by a CPAP titration study. I went over her test results  with her in detail today. Baseline sleep study from 10/05/2016 showed a sleep latency markedly delayed of 261 minutes and REM latency was also delayed, sleep efficiency was reduced at 51.1%. She had absence of slow-wave sleep and a normal percentage of REM sleep. Total AHI was highly elevated at 48.3 per hour, REM AHI was 110.9 per hour. Average oxygen saturation was 95%, nadir was 58%. She had mild PLMS with minimal arousals. Based on her symptoms and her test results I asked her to return for a full night CPAP titration study. She had this on 10/26/2016. Sleep efficiency was  86.2%, sleep latency 9.5 minutes and REM latency was 59 minutes. She had a normal percentage of REM sleep. She was fitted with nasal pillows. CPAP was titrated from 5 cm to 14 cm. On the final pressure, her AHI was 0 per hour with supine non-REM sleep achieved an O2 nadir was 93%. Based on her test results I prescribed CPAP therapy for home use.   She had an EEG on 09/30/2016 which was reported as normal in the awake and drowsy states.   I reviewed her CPAP compliance data from 12/19/2016 through 01/17/2017 which is a total of 30 days, during which time she used her CPAP every night with percent used days greater than 4 hours at 100%, indicating superb compliance with an average usage of 7 hours and 40 minutes, residual AHI low at 1.5 per hour, leak acceptable with the 95th percentile at 13.2 L/m on a pressure of 14 cm.    09/28/2016: (She) reports snoring and excessive daytime somnolence as well as recent spells of decreased consciousness.  Her Epworth sleepiness score is 11 out of 24 today, fatigue score is 42 out of 63. She is divorced and lives alone. She has 1 child. She quit smoking some 5 years ago and drinks alcohol occasionally in the form of one glass of wine typically. She drinks caffeine in the form of coffee, 2 cups daily. She reports spells of inattentiveness. She has had 2 spells some 30 days ago. She had a brain MRI w/wo contrast on 08/26/16, which I reviewed: IMPRESSION: 1. No acute intracranial infarct or other process identified. 2. Partially empty sella with prominent CSF within Meckel's cave bilaterally. This appearance can be seen in the setting of idiopathic intracranial hypertension (pseudotumor cerebri the), but could also reflect a normal anatomic variant. Correlation with CSF opening pressure could be performed for confirmatory purposes as clinically indicated. 3. Otherwise normal brain MRI. She had CTH wo contrast on 09/25/16, which I reviewed: IMPRESSION: No acute  abnormality is noted. She presented to the emergency room on 09/25/2016 with a staring spell. She was referred to neurology at [facility]. She previously saw Dr. Jannifer Franklin in this office at the time of her meningitis, last visit in March 2016 with our nurse practitioner. She has gained weight in the past 3 years, almost 20 lb. Has been told, snoring is loud.  She has woken up with a sense of gasping. She has occasional morning headaches, has had RLS Sx.  She tosses and turns, has nocturia about 1-2 times per night. She naps occasionally. She has difficulty falling asleep and staying asleep, no sleep aid.  Her sister reports that she had spells of inattention and losing contact with reality in 2014. She had workup for this as I understand. She has no history of convulsions or trembling, shaking spells. The patient has no recollection of the event during the time which last just a  few minutes at a time. She has been told by the emergency room physician not to drive. The patient denies frank depression but endorses anxiety. She is tearful multiple times during the appointment. Her sister reports that she has noticed the patient is not very outgoing, stays alone a lot. They lost one sister last year. They were a total of 3 brothers and 4 sisters.  Her Past Medical History Is Significant For: Past Medical History:  Diagnosis Date  . Anxiety   . Cataract   . Chronic insomnia 10/13/2016  . Depression   . Gait disorder 03/14/2014  . HTN (hypertension)   . Hx of cardiovascular stress test    Lexiscan Myoview (1/16):  No ischemia, EF 68%, breast atten; Low Risk  . Hyperlipemia   . Memory difficulty 03/14/2014  . Meningitis due to bacteria 01-18-14  . OSA (obstructive sleep apnea) 11/30/2016  . TIA (transient ischemic attack)     Her Past Surgical History Is Significant For: Past Surgical History:  Procedure Laterality Date  . NO PAST SURGERIES      Her Family History Is Significant For: Family History   Problem Relation Age of Onset  . Heart attack Mother 33  . Stroke Mother   . Hypertension Mother   . Heart attack Father 38  . Cerebral aneurysm Sister   . Hypertension Sister   . Kidney disease Sister        ESRD; HD in last year  . Hypertension Brother   . Hyperlipidemia Brother   . Rheum arthritis Sister   . Arthritis Sister   . Hypertension Brother   . Hyperlipidemia Brother   . Hyperlipidemia Brother   . Hypertension Brother     Her Social History Is Significant For: Social History   Socioeconomic History  . Marital status: Divorced    Spouse name: Not on file  . Number of children: 1  . Years of education: college  . Highest education level: Some college, no degree  Occupational History  . Occupation: Retired Scientist, research (physical sciences): [employer]  Social Needs  . Financial resource strain: Not hard at all  . Food insecurity    Worry: Never true    Inability: Never true  . Transportation needs    Medical: Yes    Non-medical: Yes  Tobacco Use  . Smoking status: Former Smoker    Packs/day: 0.50    Years: 40.00    Pack years: 20.00    Types: Cigarettes    Quit date: 07/09/2011    Years since quitting: 7.3  . Smokeless tobacco: Never Used  Substance and Sexual Activity  . Alcohol use: Yes    Alcohol/week: 0.0 standard drinks    Comment: Rare  . Drug use: No  . Sexual activity: Never    Birth control/protection: Abstinence  Lifestyle  . Physical activity    Days per week: 0 days    Minutes per session: 0 min  . Stress: To some extent  Relationships  . Social connections    Talks on phone: More than three times a week    Gets together: Never    Attends religious service: More than 4 times per year    Active member of club or organization: No    Attends meetings of clubs or organizations: Never    Relationship status: Divorced  Other Topics Concern  . Not on file  Social History Narrative   Marital status: divorced x 35 years; not dating; not interested in 2019.  Children: 1 child/son (44); 2 grandchildren; [place]      Lives:  Alone in [place] in [place].      Employment: retired 2008; Engineer, technical sales.      Tobacco: quit smoking in 2013.  Smoked x many years       Alcohol: none; holidays only      Exercise: none; quit exercising two years ago in 2015.        Seatbelt: 100%; no texting. Does not drive in 1505 due to seizure activity.       ADLs: no driving in [id number] due to seizure activity; independent with ADLs.  Has cane for long distances      Advanced Directives: none; [name]/sister.  FULL CODE no prolonged measures.         Patient is single lives at home alone, has 1 child   Patient is right handed   Education level is 1 year of college   Caffeine consumption is 2 cups daily    Her Allergies Are:  No Known Allergies:   Her Current Medications Are:  Outpatient Encounter Medications as of 10/25/2018  Medication Sig  . aspirin 81 MG chewable tablet Chew 81 mg by mouth daily.  . carbamazepine (TEGRETOL) 200 MG tablet Take 1 tablet (200 mg total) by mouth 2 (two) times daily.  . carvedilol (COREG) 3.125 MG tablet TAKE 1 TABLET(3.125 MG) BY MOUTH TWICE DAILY WITH A MEAL  . cholecalciferol (VITAMIN D3) 25 MCG (1000 UT) tablet Take 1,000 Units by mouth daily.  Marland Kitchen escitalopram (LEXAPRO) 10 MG tablet TAKE 1 TABLET(10 MG) BY MOUTH AT BEDTIME  . levETIRAcetam (KEPPRA) 750 MG tablet TAKE 2 TABLETS(1500 MG) BY MOUTH TWICE DAILY  . lisinopril-hydrochlorothiazide (PRINZIDE,ZESTORETIC) 20-25 MG tablet TAKE 1 TABLET BY MOUTH DAILY  . Multiple Vitamins-Minerals (MULTIVITAMIN PO) Take 1 tablet by mouth daily.  . simvastatin (ZOCOR) 20 MG tablet Take 1 tablet (20 mg total) by mouth daily.   No facility-administered encounter medications on file as of 10/25/2018.   :  Review of Systems:  Out of a complete 14 point review of systems, all are reviewed and negative with the exception of these symptoms as listed  below:  Review of Systems  Neurological:       Pt presents today to discuss her cpap. Pt reports some difficulty with her mask but otherwise it is going ok.    Objective:  Neurological Exam  Physical Exam Physical Examination:   Vitals:   10/25/18 1535  BP: 117/70  Pulse: (!) 56   General Examination: The patient is a very pleasant 68 y.o. female in no acute distress. She appears well-developed and well-nourished and well groomed.   HEENT:Normocephalic, atraumatic, pupils are equal, round and reactive to light, extraocular tracking is good without limitation to gaze excursion or nystagmus noted. Normal smooth pursuit is noted. Hearing is grossly intact. Face is symmetric with normal facial animation and normal facial sensation. Speech is clear with no dysarthria noted. There is no hypophonia. There is no lip, neck/head, jaw or voice tremor. Neck shows FROM. Oropharynx exam reveals: mildmouth dryness, adequatedental hygiene and moderateairway crowding. Tongue protrudes centrally and palate elevates symmetrically. Tonsils are 1+ in size.   Chest:Clear to auscultation without wheezing, rhonchi or crackles noted.  Heart:S1+S2+0, regular and normal without murmurs, rubs or gallops noted.   Abdomen:Soft, non-tender and non-distended.  Extremities:There is noobvious change.   Skin: Warm and dry without trophic changes noted.  Musculoskeletal: exam reveals no obvious joint deformities,  tenderness or joint swelling or erythema.   Neurologically:  Mental status: The patient is awake, alert and oriented in all 4 spheres. Herimmediate and remote memory, attention, language skills and fund of knowledge are appropriate. There is no evidence of aphasia, agnosia, apraxia or anomia. Speech is clear with normal prosody and enunciation. Thought process is linear. Mood is constrictedand affect is blunted.  Cranial nerves II - XII are as described above under HEENT exam. In addition:  shoulder shrug is normal with equal shoulder height noted. Motor exam: Normal bulk, strength and tone is noted. There is no drift, tremor or rebound.  Fine motor skills and coordination:grosslyintact.  Cerebellar testing: No dysmetria or intention tremor. There is no truncal or gait ataxia.  Sensory exam: intact to light touch.  Gait, station and balance: Shestands easily. Posture is age-appropriate and stance is narrow based. Gait shows normalstride length and normalpace. No problems turning are noted.   Assessment and Plan:  In summary, TAIA BRAMLETT a very pleasant 68 year old female with an underlying medical history of anxiety, depression, hypertension, hyperlipidemia, history of meningitis in 2015, anemia, gait disorder, memory loss, prior smoking, seizure d/o, and obesity, whopresents forfollow up consultation of hersevere OSA, well established on CPAP therapy at a pressure of 14 cm with full compliance and ongoing good results. She is highly commended for her treatment adherence. She had a baseline sleep study in early June 2018 and a CPAP titration study in late June 2018. Her apnea is under Good control but she has had issues with air leaking from time to time and also headgear sliding off.  I suggested we reduce her set pressure from 14 cm to 13 cm to see if the leak improves and she is advised not to tighten the mask or headgear to the point of discomfort. She has benefited from treatment, indicating better sleep consolidation, improved daytime somnolence. She has been on Keppra for her seizures. From the sleep apnea standpoint she is doing very well. She is advised to follow-up in one year for this routinely, We talked about the importance of weight management.  Furthermore, she is advised to check with [place] in about 6 weeks by phone call or my chart email so we can pull another 30-day download on the lower pressure.  She is encouraged to call with any interim questions or concerns  as well.  I answered all her questions today and she was in agreement I spent 25 minutes in total face-to-face time with the patient, more than 50% of which was spent in counseling and coordination of care, reviewing test results, reviewing medication and discussing or reviewing the diagnosis of OSA, its prognosis and treatment options. Pertinent laboratory and imaging test results that were available during this visit with the patient were reviewed by me and considered in my medical decision making (see chart for details).

## 2018-10-31 ENCOUNTER — Ambulatory Visit (INDEPENDENT_AMBULATORY_CARE_PROVIDER_SITE_OTHER): Payer: Medicare Other | Admitting: Emergency Medicine

## 2018-10-31 ENCOUNTER — Other Ambulatory Visit: Payer: Self-pay

## 2018-10-31 ENCOUNTER — Encounter: Payer: Self-pay | Admitting: Emergency Medicine

## 2018-10-31 ENCOUNTER — Ambulatory Visit: Payer: Self-pay | Admitting: Neurology

## 2018-10-31 VITALS — BP 109/67 | HR 64 | Temp 98.9°F | Resp 16 | Ht 66.0 in | Wt 264.0 lb

## 2018-10-31 DIAGNOSIS — I10 Essential (primary) hypertension: Secondary | ICD-10-CM

## 2018-10-31 DIAGNOSIS — E7800 Pure hypercholesterolemia, unspecified: Secondary | ICD-10-CM

## 2018-10-31 DIAGNOSIS — R739 Hyperglycemia, unspecified: Secondary | ICD-10-CM | POA: Diagnosis not present

## 2018-10-31 LAB — LIPID PANEL

## 2018-10-31 NOTE — Patient Instructions (Addendum)
   If you have lab work done today you will be contacted with your lab results within the next 2 weeks.  If you have not heard from us then please contact us. The fastest way to get your results is to register for My Chart.   IF you received an x-ray today, you will receive an invoice from [facility]. Please contact [facility] at [phone] with questions or concerns regarding your invoice.   IF you received labwork today, you will receive an invoice from [facility]. Please contact [facility] at [phone] with questions or concerns regarding your invoice.   Our billing staff will not be able to assist you with questions regarding bills from these companies.  You will be contacted with the lab results as soon as they are available. The fastest way to get your results is to activate your My Chart account. Instructions are located on the last page of this paperwork. If you have not heard from us regarding the results in 2 weeks, please contact this office.     Hypertension, Adult High blood pressure (hypertension) is when the force of blood pumping through the arteries is too strong. The arteries are the blood vessels that carry blood from the heart throughout the body. Hypertension forces the heart to work harder to pump blood and may cause arteries to become narrow or stiff. Untreated or uncontrolled hypertension can cause a heart attack, heart failure, a stroke, kidney disease, and other problems. A blood pressure reading consists of a higher number over a lower number. Ideally, your blood pressure should be below 120/80. The first ("top") number is called the systolic pressure. It is a measure of the pressure in your arteries as your heart beats. The second ("bottom") number is called the diastolic pressure. It is a measure of the pressure in your arteries as the heart relaxes. What are the causes? The exact cause of this condition is not known. There are some conditions  that result in or are related to high blood pressure. What increases the risk? Some risk factors for high blood pressure are under your control. The following factors may make you more likely to develop this condition:  Smoking.  Having type 2 diabetes mellitus, high cholesterol, or both.  Not getting enough exercise or physical activity.  Being overweight.  Having too much fat, sugar, calories, or salt (sodium) in your diet.  Drinking too much alcohol. Some risk factors for high blood pressure may be difficult or impossible to change. Some of these factors include:  Having chronic kidney disease.  Having a family history of high blood pressure.  Age. Risk increases with age.  Race. You may be at higher risk if you are African American.  Gender. Men are at higher risk than women before age 45. After age 65, women are at higher risk than men.  Having obstructive sleep apnea.  Stress. What are the signs or symptoms? High blood pressure may not cause symptoms. Very high blood pressure (hypertensive crisis) may cause:  Headache.  Anxiety.  Shortness of breath.  Nosebleed.  Nausea and vomiting.  Vision changes.  Severe chest pain.  Seizures. How is this diagnosed? This condition is diagnosed by measuring your blood pressure while you are seated, with your arm resting on a flat surface, your legs uncrossed, and your feet flat on the floor. The cuff of the blood pressure monitor will be placed directly against the skin of your upper arm at the level of your heart.   It should be measured at least twice using the same arm. Certain conditions can cause a difference in blood pressure between your right and left arms. Certain factors can cause blood pressure readings to be lower or higher than normal for a short period of time:  When your blood pressure is higher when you are in a health care provider's office than when you are at home, this is called white coat hypertension.  Most people with this condition do not need medicines.  When your blood pressure is higher at home than when you are in a health care provider's office, this is called masked hypertension. Most people with this condition may need medicines to control blood pressure. If you have a high blood pressure reading during one visit or you have normal blood pressure with other risk factors, you may be asked to:  Return on a different day to have your blood pressure checked again.  Monitor your blood pressure at home for 1 week or longer. If you are diagnosed with hypertension, you may have other blood or imaging tests to help your health care provider understand your overall risk for other conditions. How is this treated? This condition is treated by making healthy lifestyle changes, such as eating healthy foods, exercising more, and reducing your alcohol intake. Your health care provider may prescribe medicine if lifestyle changes are not enough to get your blood pressure under control, and if:  Your systolic blood pressure is above 130.  Your diastolic blood pressure is above 80. Your personal target blood pressure may vary depending on your medical conditions, your age, and other factors. Follow these instructions at home: Eating and drinking   Eat a diet that is high in fiber and potassium, and low in sodium, added sugar, and fat. An example eating plan is called the DASH (Dietary Approaches to Stop Hypertension) diet. To eat this way: ? Eat plenty of fresh fruits and vegetables. Try to fill one half of your plate at each meal with fruits and vegetables. ? Eat whole grains, such as whole-wheat pasta, brown rice, or whole-grain bread. Fill about one fourth of your plate with whole grains. ? Eat or drink low-fat dairy products, such as skim milk or low-fat yogurt. ? Avoid fatty cuts of meat, processed or cured meats, and poultry with skin. Fill about one fourth of your plate with lean proteins, such  as fish, chicken without skin, beans, eggs, or tofu. ? Avoid pre-made and processed foods. These tend to be higher in sodium, added sugar, and fat.  Reduce your daily sodium intake. Most people with hypertension should eat less than 1,500 mg of sodium a day.  Do not drink alcohol if: ? Your health care provider tells you not to drink. ? You are pregnant, may be pregnant, or are planning to become pregnant.  If you drink alcohol: ? Limit how much you use to:  0-1 drink a day for women.  0-2 drinks a day for men. ? Be aware of how much alcohol is in your drink. In the [place] one drink equals one 12 oz bottle of beer (355 mL), one 5 oz glass of wine (148 mL), or one 1 oz glass of hard liquor (44 mL). Lifestyle   Work with your health care provider to maintain a healthy body weight or to lose weight. Ask what an ideal weight is for you.  Get at least 30 minutes of exercise most days of the week. Activities may include walking, swimming, or   biking.  Include exercise to strengthen your muscles (resistance exercise), such as Pilates or lifting weights, as part of your weekly exercise routine. Try to do these types of exercises for 30 minutes at least 3 days a week.  Do not use any products that contain nicotine or tobacco, such as cigarettes, e-cigarettes, and chewing tobacco. If you need help quitting, ask your health care provider.  Monitor your blood pressure at home as told by your health care provider.  Keep all follow-up visits as told by your health care provider. This is important. Medicines  Take over-the-counter and prescription medicines only as told by your health care provider. Follow directions carefully. Blood pressure medicines must be taken as prescribed.  Do not skip doses of blood pressure medicine. Doing this puts you at risk for problems and can make the medicine less effective.  Ask your health care provider about side effects or reactions to medicines that you  should watch for. Contact a health care provider if you:  Think you are having a reaction to a medicine you are taking.  Have headaches that keep coming back (recurring).  Feel dizzy.  Have swelling in your ankles.  Have trouble with your vision. Get help right away if you:  Develop a severe headache or confusion.  Have unusual weakness or numbness.  Feel faint.  Have severe pain in your chest or abdomen.  Vomit repeatedly.  Have trouble breathing. Summary  Hypertension is when the force of blood pumping through your arteries is too strong. If this condition is not controlled, it may put you at risk for serious complications.  Your personal target blood pressure may vary depending on your medical conditions, your age, and other factors. For most people, a normal blood pressure is less than 120/80.  Hypertension is treated with lifestyle changes, medicines, or a combination of both. Lifestyle changes include losing weight, eating a healthy, low-sodium diet, exercising more, and limiting alcohol. This information is not intended to replace advice given to you by your health care provider. Make sure you discuss any questions you have with your health care provider. Document Released: 04/19/2005 Document Revised: 12/28/2017 Document Reviewed: 12/28/2017 [employer] Patient Education  2020 [employer]  

## 2018-10-31 NOTE — Progress Notes (Signed)
[name] [name] 68 y.o.   Chief Complaint  Patient presents with  . Hypertension    follow up 6 months from office visit 05/26/2018    HISTORY OF PRESENT ILLNESS: This is a 68 y.o. female with history of hypertension here for follow-up.  Compliant with medications.  Doing well.  Has no complaints or medical concerns today.  Has history of seizure disorder on multiple medications.  Saw neurologist recently.  Adequate levels of Tegretol and Keppra.  HPI   Prior to Admission medications   Medication Sig Start Date End Date Taking? Authorizing Provider  aspirin 81 MG chewable tablet Chew 81 mg by mouth daily.   Yes [provider]  carbamazepine (TEGRETOL) 200 MG tablet Take 1 tablet (200 mg total) by mouth 2 (two) times daily. 10/12/18  Yes Suzzanne Cloud, NP  carvedilol (COREG) 3.125 MG tablet TAKE 1 TABLET(3.125 MG) BY MOUTH TWICE DAILY WITH A MEAL 07/05/18  Yes Regnia Mathwig, Ines Bloomer, MD  cholecalciferol (VITAMIN D3) 25 MCG (1000 UT) tablet Take 1,000 Units by mouth daily.   Yes [provider]  escitalopram (LEXAPRO) 10 MG tablet TAKE 1 TABLET(10 MG) BY MOUTH AT BEDTIME 07/10/18  Yes Lory Nowaczyk, Ines Bloomer, MD  levETIRAcetam (KEPPRA) 750 MG tablet TAKE 2 TABLETS(1500 MG) BY MOUTH TWICE DAILY 10/12/18  Yes Suzzanne Cloud, NP  lisinopril-hydrochlorothiazide (PRINZIDE,ZESTORETIC) 20-25 MG tablet TAKE 1 TABLET BY MOUTH DAILY 07/05/18  Yes Dru Laurel, Ines Bloomer, MD  Multiple Vitamins-Minerals (MULTIVITAMIN PO) Take 1 tablet by mouth daily.   Yes [provider]  simvastatin (ZOCOR) 20 MG tablet Take 1 tablet (20 mg total) by mouth daily. 11/23/17  Yes Wardell Honour, MD    No Known Allergies  Patient Active Problem List   Diagnosis Date Noted  . Staring episodes 11/24/2017  . OSA (obstructive sleep apnea) 11/30/2016  . Episodic altered awareness 10/13/2016  . Chronic insomnia 10/13/2016  . Near syncope 08/26/2016  . Transient cerebral ischemia 08/26/2016  .  Transient memory loss 08/26/2016  . Daytime sleepiness 08/26/2016  . Morbid obesity ([place]) 05/31/2016  . Glucose intolerance (impaired glucose tolerance) 05/18/2016  . 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 Generalized anxiety disorder 03/15/2014  . Memory difficulty 03/14/2014  . Debility 02/01/2014  . Demand myocardial ischemic related infarction 01/23/2014    Class: Diagnosis of  . Bacterial meningitis-01/18/2014-s.pneumo 01/18/2014  . Depression, reactive 10/12/2012  . Chronic neutropenia ([place]) 04/14/2012  . Hyperlipemia 10/20/2011  . HTN (hypertension) 10/20/2011    Past Medical History:  Diagnosis Date  . Anxiety   . Cataract   . Chronic insomnia 10/13/2016  . Depression   . Gait disorder 03/14/2014  . HTN (hypertension)   . Hx of cardiovascular stress test    Lexiscan Myoview (1/16):  No ischemia, EF 68%, breast atten; Low Risk  . Hyperlipemia   . Memory difficulty 03/14/2014  . Meningitis due to bacteria 01-18-14  . OSA (obstructive sleep apnea) 11/30/2016  . TIA (transient ischemic attack)     Past Surgical History:  Procedure Laterality Date  . NO PAST SURGERIES      Social History   Socioeconomic History  . Marital status: Divorced    Spouse name: Not on file  . Number of children: 1  . Years of education: college  . Highest education level: Some college, no degree  Occupational History  . Occupation: Retired Scientist, research (physical sciences): [employer]  Social Needs  . Financial resource strain: Not hard at all  .  Food insecurity    Worry: Never true    Inability: Never true  . Transportation needs    Medical: Yes    Non-medical: Yes  Tobacco Use  . Smoking status: Former Smoker    Packs/day: 0.50    Years: 40.00    Pack years: 20.00    Types: Cigarettes    Quit date: 07/09/2011    Years since quitting: 7.3  . Smokeless tobacco: Never Used  Substance and Sexual Activity  . Alcohol use: Yes    Alcohol/week: 0.0 standard drinks    Comment: Rare  . Drug  use: No  . Sexual activity: Never    Birth control/protection: Abstinence  Lifestyle  . Physical activity    Days per week: 0 days    Minutes per session: 0 min  . Stress: To some extent  Relationships  . Social connections    Talks on phone: More than three times a week    Gets together: Never    Attends religious service: More than 4 times per year    Active member of club or organization: No    Attends meetings of clubs or organizations: Never    Relationship status: Divorced  . Intimate partner violence    Fear of current or ex partner: No    Emotionally abused: No    Physically abused: No    Forced sexual activity: No  Other Topics Concern  . Not on file  Social History Narrative   Marital status: divorced x 35 years; not dating; not interested in 2019.      Children: 1 child/son (44); 2 grandchildren; [place]      Lives:  Al[place].      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      Tobacco: quit smoking in 2013.  Smoked x many years       Alcohol: none; holidays only      Exercise: none; quit exercising two years ago in 2015.        Seatbelt: 100%; no texting. Does not drive in [id number] due to seizure activity.       ADLs: no driving in [id number] due to seizure activity; independent with ADLs.  Has cane for long distances      Advanced Directives: none; [name]/sister.  FULL CODE no prolonged measures.         Patient is single lives at home alone, has 1 child   Patient is right handed   Education level is 1 year of college   Caffeine consumption is 2 cups daily    Family History  Problem Relation Age of Onset  . Heart attack Mother 5670  . Stroke Mother   . Hypertension Mother   . Heart attack Father [id number]  . Cerebral aneurysm Sister   . Hypertension Sister   . Kidney disease Sister        ESRD; HD in last year  . Hypertension Brother   . Hyperlipidemia Brother   . Rheum arthritis Sister   . Arthritis Sister   .  Hypertension Brother   . Hyperlipidemia Brother   . Hyperlipidemia Brother   . Hypertension Brother      Review of Systems  Constitutional: Negative.  Negative for chills, fever and malaise/fatigue.  HENT: Negative.  Negative for congestion, hearing loss and sore throat.   Eyes: Negative.   Respiratory: Negative.  Negative for cough and shortness of breath.   Cardiovascular: Negative.  Negative for chest pain  and leg swelling.  Gastrointestinal: Negative.  Negative for abdominal pain, diarrhea, nausea and vomiting.  Genitourinary: Negative.  Negative for dysuria and hematuria.  Musculoskeletal: Negative.  Negative for back pain, myalgias and neck pain.  Skin: Negative.  Negative for rash.  Neurological: Negative for dizziness, speech change, focal weakness, loss of consciousness, weakness and headaches.  Psychiatric/Behavioral: Negative for depression.  All other systems reviewed and are negative.   BP Readings from Last 3 Encounters:  10/31/18 109/67  10/25/18 117/70  05/26/18 107/69    Physical Exam Vitals signs reviewed.  Constitutional:      Appearance: Normal appearance.  HENT:     Head: Normocephalic and atraumatic.  Eyes:     Extraocular Movements: Extraocular movements intact.     Pupils: Pupils are equal, round, and reactive to light.  Neck:     Musculoskeletal: Normal range of motion and neck supple.  Cardiovascular:     Rate and Rhythm: Normal rate and regular rhythm.     Pulses: Normal pulses.     Heart sounds: Normal heart sounds.  Pulmonary:     Effort: Pulmonary effort is normal.     Breath sounds: Normal breath sounds.  Musculoskeletal: Normal range of motion.     Right lower leg: No edema.     Left lower leg: No edema.  Skin:    General: Skin is warm and dry.     Capillary Refill: Capillary refill takes less than 2 seconds.  Neurological:     General: No focal deficit present.     Mental Status: She is alert and oriented to person, place, and  time.    A total of 25 minutes was spent in the room with the patient, greater than 50% of which was in counseling/coordination of care regarding chronic medical problems, treatments, medications and side effects, importance of nutrition and exercise, COVID-19 precautions, prognosis and need for follow-up.   ASSESSMENT & PLAN: Kamry was seen today for hypertension.  Diagnoses and all orders for this visit:  Essential hypertension -     CBC with Differential/Platelet  Pure hypercholesterolemia -     Lipid panel -     Comprehensive metabolic panel  Hyperglycemia -     Hemoglobin A1c    Patient Instructions       If you have lab work done today you will be contacted with your lab results within the next 2 weeks.  If you have not heard from [place] then please contact us. The fastest way to get your results is to register for My Chart.   IF you received an x-ray today, you will receive an invoice from [facility]. Please contact [facility] at [phone] with questions or concerns regarding your invoice.   IF you received labwork today, you will receive an invoice from [facility]. Please contact [facility] at [phone] with questions or concerns regarding your invoice.   Our billing staff will not be able to assist you with questions regarding bills from these companies.  You will be contacted with the lab results as soon as they are available. The fastest way to get your results is to activate your My Chart account. Instructions are located on the last page of this paperwork. If you have not heard from [place] regarding the results in 2 weeks, please contact this office.     Hypertension, Adult High blood pressure (hypertension) is when the force of blood pumping through the arteries is too strong. The arteries are the blood vessels that  carry blood from the heart throughout the body. Hypertension forces the heart to work harder to pump blood and may cause arteries  to become narrow or stiff. Untreated or uncontrolled hypertension can cause a heart attack, heart failure, a stroke, kidney disease, and other problems. A blood pressure reading consists of a higher number over a lower number. Ideally, your blood pressure should be below 120/80. The first ("top") number is called the systolic pressure. It is a measure of the pressure in your arteries as your heart beats. The second ("bottom") number is called the diastolic pressure. It is a measure of the pressure in your arteries as the heart relaxes. What are the causes? The exact cause of this condition is not known. There are some conditions that result in or are related to high blood pressure. What increases the risk? Some risk factors for high blood pressure are under your control. The following factors may make you more likely to develop this condition:  Smoking.  Having type 2 diabetes mellitus, high cholesterol, or both.  Not getting enough exercise or physical activity.  Being overweight.  Having too much fat, sugar, calories, or salt (sodium) in your diet.  Drinking too much alcohol. Some risk factors for high blood pressure may be difficult or impossible to change. Some of these factors include:  Having chronic kidney disease.  Having a family history of high blood pressure.  Age. Risk increases with age.  Race. You may be at higher risk if you are African American.  Gender. Men are at higher risk than women before age 68. After age 68, women are at higher risk than men.  Having obstructive sleep apnea.  Stress. What are the signs or symptoms? High blood pressure may not cause symptoms. Very high blood pressure (hypertensive crisis) may cause:  Headache.  Anxiety.  Shortness of breath.  Nosebleed.  Nausea and vomiting.  Vision changes.  Severe chest pain.  Seizures. How is this diagnosed? This condition is diagnosed by measuring your blood pressure while you are seated,  with your arm resting on a flat surface, your legs uncrossed, and your feet flat on the floor. The cuff of the blood pressure monitor will be placed directly against the skin of your upper arm at the level of your heart. It should be measured at least twice using the same arm. Certain conditions can cause a difference in blood pressure between your right and left arms. Certain factors can cause blood pressure readings to be lower or higher than normal for a short period of time:  When your blood pressure is higher when you are in a health care provider's office than when you are at home, this is called white coat hypertension. Most people with this condition do not need medicines.  When your blood pressure is higher at home than when you are in a health care provider's office, this is called masked hypertension. Most people with this condition may need medicines to control blood pressure. If you have a high blood pressure reading during one visit or you have normal blood pressure with other risk factors, you may be asked to:  Return on a different day to have your blood pressure checked again.  Monitor your blood pressure at home for 1 week or longer. If you are diagnosed with hypertension, you may have other blood or imaging tests to help your health care provider understand your overall risk for other conditions. How is this treated? This condition is treated by making  healthy lifestyle changes, such as eating healthy foods, exercising more, and reducing your alcohol intake. Your health care provider may prescribe medicine if lifestyle changes are not enough to get your blood pressure under control, and if:  Your systolic blood pressure is above 130.  Your diastolic blood pressure is above 80. Your personal target blood pressure may vary depending on your medical conditions, your age, and other factors. Follow these instructions at home: Eating and drinking   Eat a diet that is high in fiber  and potassium, and low in sodium, added sugar, and fat. An example eating plan is called the DASH (Dietary Approaches to Stop Hypertension) diet. To eat this way: ? Eat plenty of fresh fruits and vegetables. Try to fill one half of your plate at each meal with fruits and vegetables. ? Eat whole grains, such as whole-wheat pasta, brown rice, or whole-grain bread. Fill about one fourth of your plate with whole grains. ? Eat or drink low-fat dairy products, such as skim milk or low-fat yogurt. ? Avoid fatty cuts of meat, processed or cured meats, and poultry with skin. Fill about one fourth of your plate with lean proteins, such as fish, chicken without skin, beans, eggs, or tofu. ? Avoid pre-made and processed foods. These tend to be higher in sodium, added sugar, and fat.  Reduce your daily sodium intake. Most people with hypertension should eat less than 1,500 mg of sodium a day.  Do not drink alcohol if: ? Your health care provider tells you not to drink. ? You are pregnant, may be pregnant, or are planning to become pregnant.  If you drink alcohol: ? Limit how much you use to:  0-1 drink a day for women.  0-2 drinks a day for men. ? Be aware of how much alcohol is in your drink. In the [place] one drink equals one 12 oz bottle of beer (355 mL), one 5 oz glass of wine (148 mL), or one 1 oz glass of hard liquor (44 mL). Lifestyle   Work with your health care provider to maintain a healthy body weight or to lose weight. Ask what an ideal weight is for you.  Get at least 30 minutes of exercise most days of the week. Activities may include walking, swimming, or biking.  Include exercise to strengthen your muscles (resistance exercise), such as Pilates or lifting weights, as part of your weekly exercise routine. Try to do these types of exercises for 30 minutes at least 3 days a week.  Do not use any products that contain nicotine or tobacco, such as cigarettes, e-cigarettes, and chewing  tobacco. If you need help quitting, ask your health care provider.  Monitor your blood pressure at home as told by your health care provider.  Keep all follow-up visits as told by your health care provider. This is important. Medicines  Take over-the-counter and prescription medicines only as told by your health care provider. Follow directions carefully. Blood pressure medicines must be taken as prescribed.  Do not skip doses of blood pressure medicine. Doing this puts you at risk for problems and can make the medicine less effective.  Ask your health care provider about side effects or reactions to medicines that you should watch for. Contact a health care provider if you:  Think you are having a reaction to a medicine you are taking.  Have headaches that keep coming back (recurring).  Feel dizzy.  Have swelling in your ankles.  Have trouble with your vision.  Get help right away if you:  Develop a severe headache or confusion.  Have unusual weakness or numbness.  Feel faint.  Have severe pain in your chest or abdomen.  Vomit repeatedly.  Have trouble breathing. Summary  Hypertension is when the force of blood pumping through your arteries is too strong. If this condition is not controlled, it may put you at risk for serious complications.  Your personal target blood pressure may vary depending on your medical conditions, your age, and other factors. For most people, a normal blood pressure is less than 120/80.  Hypertension is treated with lifestyle changes, medicines, or a combination of both. Lifestyle changes include losing weight, eating a healthy, low-sodium diet, exercising more, and limiting alcohol. This information is not intended to replace advice given to you by your health care provider. Make sure you discuss any questions you have with your health care provider. Document Released: 04/19/2005 Document Revised: 12/28/2017 Document Reviewed: 12/28/2017  Elsevier Patient Education  2020 Elsevier Inc.      Edwina Barth, MD [facility]

## 2018-11-01 LAB — COMPREHENSIVE METABOLIC PANEL
ALT: 12 IU/L (ref 0–32)
AST: 14 IU/L (ref 0–40)
Albumin/Globulin Ratio: 2.4 — ABNORMAL HIGH (ref 1.2–2.2)
Albumin: 4.5 g/dL (ref 3.8–4.8)
Alkaline Phosphatase: 106 IU/L (ref 39–117)
BUN/Creatinine Ratio: 18 (ref 12–28)
BUN: 17 mg/dL (ref 8–27)
Bilirubin Total: 0.3 mg/dL (ref 0.0–1.2)
CO2: 25 mmol/L (ref 20–29)
Calcium: 9.4 mg/dL (ref 8.7–10.3)
Chloride: 101 mmol/L (ref 96–106)
Creatinine, Ser: 0.93 mg/dL (ref 0.57–1.00)
GFR calc Af Amer: 74 mL/min/{1.73_m2} (ref >59)
GFR calc non Af Amer: 64 mL/min/{1.73_m2} (ref >59)
Globulin, Total: 1.9 g/dL (ref 1.5–4.5)
Glucose: 98 mg/dL (ref 65–99)
Potassium: 3.7 mmol/L (ref 3.5–5.2)
Sodium: 140 mmol/L (ref 134–144)
Total Protein: 6.4 g/dL (ref 6.0–8.5)

## 2018-11-01 LAB — CBC WITH DIFFERENTIAL/PLATELET
Basophils Absolute: 0 10*3/uL (ref 0.0–0.2)
Basos: 1 %
EOS (ABSOLUTE): 0.1 10*3/uL (ref 0.0–0.4)
Eos: 3 %
Hematocrit: 36.9 % (ref 34.0–46.6)
Hemoglobin: 12.1 g/dL (ref 11.1–15.9)
Immature Grans (Abs): 0 10*3/uL (ref 0.0–0.1)
Immature Granulocytes: 0 %
Lymphocytes Absolute: 0.9 10*3/uL (ref 0.7–3.1)
Lymphs: 29 %
MCH: 26.4 pg — ABNORMAL LOW (ref 26.6–33.0)
MCHC: 32.8 g/dL (ref 31.5–35.7)
MCV: 80 fL (ref 79–97)
Monocytes Absolute: 0.5 10*3/uL (ref 0.1–0.9)
Monocytes: 16 %
Neutrophils Absolute: 1.5 10*3/uL (ref 1.4–7.0)
Neutrophils: 51 %
Platelets: 234 10*3/uL (ref 150–450)
RBC: 4.59 x10E6/uL (ref 3.77–5.28)
RDW: 13 % (ref 11.7–15.4)
WBC: 2.9 10*3/uL — ABNORMAL LOW (ref 3.4–10.8)

## 2018-11-01 LAB — LIPID PANEL
Chol/HDL Ratio: 3 ratio (ref 0.0–4.4)
Cholesterol, Total: 218 mg/dL — ABNORMAL HIGH (ref 100–199)
HDL: 72 mg/dL (ref >39)
LDL Calculated: 126 mg/dL — ABNORMAL HIGH (ref 0–99)
Triglycerides: 101 mg/dL (ref 0–149)
VLDL Cholesterol Cal: 20 mg/dL (ref 5–40)

## 2018-11-01 LAB — HEMOGLOBIN A1C
Est. average glucose Bld gHb Est-mCnc: 131 mg/dL
Hgb A1c MFr Bld: 6.2 % — ABNORMAL HIGH (ref 4.8–5.6)

## 2018-11-02 ENCOUNTER — Encounter: Payer: Self-pay | Admitting: Emergency Medicine

## 2018-11-15 ENCOUNTER — Telehealth: Payer: Self-pay | Admitting: Emergency Medicine

## 2018-11-15 NOTE — Telephone Encounter (Signed)
Copied from [place] [phone]. Topic: General - Inquiry >> Nov 15, 2018  2:29 PM Percell Belt A wrote: Reason for CRM: pt called in to check on the status of her handcap placard .  She would like it mailed to her. She gave it to Nurse when she was there in June 2020

## 2018-11-16 NOTE — Telephone Encounter (Signed)
Not sure what happened to it. Please look into it. Thanks.

## 2018-11-27 ENCOUNTER — Encounter: Payer: Self-pay | Admitting: Emergency Medicine

## 2018-11-27 NOTE — Telephone Encounter (Signed)
Called left message in vm to call back.

## 2018-11-29 NOTE — Telephone Encounter (Signed)
Please look into this placard thing.  Thanks.

## 2018-12-01 ENCOUNTER — Telehealth: Payer: Self-pay | Admitting: *Deleted

## 2018-12-01 NOTE — Telephone Encounter (Signed)
Spoke to patient, I have a copy of the application for Handicap placard to mail to her. Patient agreed.

## 2018-12-04 NOTE — Telephone Encounter (Signed)
Spoke to patient and mail copy of handicap placard, per patient request.

## 2018-12-06 ENCOUNTER — Telehealth: Payer: Self-pay | Admitting: Neurology

## 2018-12-06 NOTE — Telephone Encounter (Signed)
I called pt and discussed this with her. Pt would prefer to keep it set at 13 cm H2O for now. Pt verbalized understanding of recommendations. Pt had no questions at this time but was encouraged to call back if questions arise.

## 2018-12-06 NOTE — Telephone Encounter (Signed)
Pulled a 30 day download. Will give to Dr. Rexene Alberts review. Pt had been instructed to call after 6 weeks for the pressure reduction check.

## 2018-12-06 NOTE — Telephone Encounter (Signed)
I reviewed patient's compliance data on CPAP therapy from 11/06/2018 through 12/05/2018, which is a Total of 30 days, during which time she was 100% compliant with an average usage of 8 hours and 5 minutes which is excellent, AHI on average is 1.1/h, leak has improved to an average of 14.8 L/min on a pressure of 13 cm.  Please advise patient that the pressure reduction from 14 cm to 13 cm has been very successful.  Her leak has indeed improved.  Her apnea score is low and we could even try to reduce the pressure if she would like to try to 12 cm at this point.  We could also keep it at 13 cm and follow-up yearly as scheduled.

## 2018-12-06 NOTE — Telephone Encounter (Signed)
Pt called in and stated that the adjustment to her pressure is okay , but she still has to do a lot of adjusting with the straps at night.

## 2019-01-04 ENCOUNTER — Other Ambulatory Visit: Payer: Self-pay | Admitting: Emergency Medicine

## 2019-01-15 ENCOUNTER — Other Ambulatory Visit: Payer: Self-pay | Admitting: Emergency Medicine

## 2019-01-15 DIAGNOSIS — I10 Essential (primary) hypertension: Secondary | ICD-10-CM

## 2019-01-16 ENCOUNTER — Other Ambulatory Visit: Payer: Self-pay | Admitting: Neurology

## 2019-01-30 ENCOUNTER — Other Ambulatory Visit: Payer: Self-pay | Admitting: Emergency Medicine

## 2019-01-30 NOTE — Telephone Encounter (Signed)
Please review for refill for simvastatin 20 mg.  /Prescription expired on 11/23/2018 Prescribed by a historical provider.

## 2019-02-01 ENCOUNTER — Telehealth: Payer: Self-pay | Admitting: Emergency Medicine

## 2019-02-01 NOTE — Telephone Encounter (Signed)
Patient is at pharmacy for zocor 20mg  tabs. Refill was sent in yesterday to [facility]. TC to pharmacy who stated they have not received request. Gave verbal order for zocor 20 mg tabs one by mouth qday. 90 tabs 1 refill.

## 2019-02-19 DIAGNOSIS — G4733 Obstructive sleep apnea (adult) (pediatric): Secondary | ICD-10-CM | POA: Diagnosis not present

## 2019-02-23 DIAGNOSIS — G4733 Obstructive sleep apnea (adult) (pediatric): Secondary | ICD-10-CM | POA: Diagnosis not present

## 2019-04-03 ENCOUNTER — Other Ambulatory Visit: Payer: Self-pay | Admitting: Emergency Medicine

## 2019-04-16 ENCOUNTER — Telehealth: Payer: Self-pay | Admitting: Neurology

## 2019-04-16 NOTE — Telephone Encounter (Signed)
I called pt and gave her mychart support to call if needed.  Made appt for mychart VV and then telephone if not able to connect.

## 2019-04-16 NOTE — Telephone Encounter (Signed)
Patient called wanting to know if her visit on Wednesday could be a telephone visit instead of a in person or mychart visit. Patient states she keeps having issues with her password for mychart and has a android phone. Please follow up.

## 2019-04-18 ENCOUNTER — Encounter: Payer: Self-pay | Admitting: Neurology

## 2019-04-18 ENCOUNTER — Telehealth (INDEPENDENT_AMBULATORY_CARE_PROVIDER_SITE_OTHER): Payer: Medicare Other | Admitting: Neurology

## 2019-04-18 ENCOUNTER — Other Ambulatory Visit: Payer: Self-pay

## 2019-04-18 DIAGNOSIS — R404 Transient alteration of awareness: Secondary | ICD-10-CM | POA: Diagnosis not present

## 2019-04-18 MED ORDER — LEVETIRACETAM 750 MG PO TABS: ORAL_TABLET | ORAL | 1 refills | Status: DC

## 2019-04-18 NOTE — Progress Notes (Signed)
    Virtual Visit via Video Note  I connected with [name] [name] on 04/18/19 at 11:15 AM EST by a video enabled telemedicine application and verified that I am speaking with the correct person using two identifiers.  Location: Patient: At her home  Provider: In the office    I discussed the limitations of evaluation and management by telemedicine and the availability of in person appointments. The patient expressed understanding and agreed to proceed.  History of Present Illness: 04/18/2019 SS: [name] [name] is a 68 year old female with history of episodes of staring, likely representing seizures.  She remains on carbamazepine 200 mg twice daily, Keppra 750 mg tablet, 2 tablets twice daily.  Her last seizure occurred in May 2019.  She remains on CPAP.  Laboratory evaluation in June 2020, showed that Keppra and carbamazepine were therapeutic.  She has not had recurrent seizure.  She is tolerating medication without side effect.  She continues to live alone, she drives a car without difficulty.  She does admit she has not been walking as much as she should.  She has not had any falls.  She denies any new problems or concerns.  She indicates her overall health has been well.  She presents today for evaluation via virtual visit.  10/12/2018 SS: [name] [name] is a 68 year old female with history of episodes of staring off, likely representing seizures.  Currently taking carbamazepine 200 mg twice daily, Keppra 750 mg tablets, 2 tablets twice daily.  She remains on vitamin D supplementation while taking carbamazepine.  She reports she is tolerating medications without problem. Her last seizure episode was in May 2019. She has not had any further events. She is living alone and driving a car without difficulty.  She denies any new problems or concerns.  She reports she does need a refill on her medications. She reports compliance with her medications.    Observations/Objective: Via virtual visit, is alert  and oriented, speech clear and concise, answers questions appropriately, follows commands, no arm drift, gait appears intact  Assessment and Plan: 1.  Episodes of transient alteration of awareness, probable seizures  [name] [name] has continued to do quite well, and remained stable.  She has not had recurrent seizure.  She will remain on Keppra 750 mg tablet, 2 tablets twice a day, and carbamazepine 200 mg tablet, 1 tablet twice daily.  I will check lab work at next visit, lab work in June 2020 was normal.  She will follow-up in 6 months or sooner if needed.  She will call for recurrent seizure.  Follow Up Instructions: 10/23/2019 10:15   I discussed the assessment and treatment plan with the patient. The patient was provided an opportunity to ask questions and all were answered. The patient agreed with the plan and demonstrated an understanding of the instructions.   The patient was advised to call back or seek an in-person evaluation if the symptoms worsen or if the condition fails to improve as anticipated.  I provided 15 minutes of non-face-to-face time during this encounter.   Evangeline Dakin, DNP  [facility] [address] [place], [place] [address] [phone]

## 2019-04-18 NOTE — Progress Notes (Signed)
I have read the note, and I agree with the clinical assessment and plan.  [name] [name] [name] [name]   

## 2019-05-23 ENCOUNTER — Encounter: Payer: Medicare Other | Admitting: Emergency Medicine

## 2019-05-24 DIAGNOSIS — G4733 Obstructive sleep apnea (adult) (pediatric): Secondary | ICD-10-CM | POA: Diagnosis not present

## 2019-05-28 ENCOUNTER — Ambulatory Visit (INDEPENDENT_AMBULATORY_CARE_PROVIDER_SITE_OTHER): Payer: Medicare Other | Admitting: Emergency Medicine

## 2019-05-28 ENCOUNTER — Other Ambulatory Visit: Payer: Self-pay

## 2019-05-28 ENCOUNTER — Encounter: Payer: Self-pay | Admitting: Emergency Medicine

## 2019-05-28 VITALS — BP 122/77 | HR 78 | Temp 98.0°F | Ht 65.5 in | Wt 278.4 lb

## 2019-05-28 DIAGNOSIS — I10 Essential (primary) hypertension: Secondary | ICD-10-CM | POA: Diagnosis not present

## 2019-05-28 DIAGNOSIS — R7303 Prediabetes: Secondary | ICD-10-CM | POA: Diagnosis not present

## 2019-05-28 DIAGNOSIS — Z0001 Encounter for general adult medical examination with abnormal findings: Secondary | ICD-10-CM | POA: Diagnosis not present

## 2019-05-28 DIAGNOSIS — G40909 Epilepsy, unspecified, not intractable, without status epilepticus: Secondary | ICD-10-CM

## 2019-05-28 DIAGNOSIS — E7800 Pure hypercholesterolemia, unspecified: Secondary | ICD-10-CM

## 2019-05-28 DIAGNOSIS — Z23 Encounter for immunization: Secondary | ICD-10-CM

## 2019-05-28 DIAGNOSIS — M62830 Muscle spasm of back: Secondary | ICD-10-CM

## 2019-05-28 DIAGNOSIS — G4733 Obstructive sleep apnea (adult) (pediatric): Secondary | ICD-10-CM | POA: Diagnosis not present

## 2019-05-28 LAB — COMPREHENSIVE METABOLIC PANEL
ALT: 10 IU/L (ref 0–32)
AST: 15 IU/L (ref 0–40)
Albumin/Globulin Ratio: 2.4 — ABNORMAL HIGH (ref 1.2–2.2)
Albumin: 4.3 g/dL (ref 3.8–4.8)
Alkaline Phosphatase: 134 IU/L — ABNORMAL HIGH (ref 39–117)
BUN/Creatinine Ratio: 20 (ref 12–28)
BUN: 19 mg/dL (ref 8–27)
Bilirubin Total: 0.2 mg/dL (ref 0.0–1.2)
CO2: 25 mmol/L (ref 20–29)
Calcium: 9.3 mg/dL (ref 8.7–10.3)
Chloride: 99 mmol/L (ref 96–106)
Creatinine, Ser: 0.95 mg/dL (ref 0.57–1.00)
GFR calc Af Amer: 71 mL/min/{1.73_m2} (ref >59)
GFR calc non Af Amer: 62 mL/min/{1.73_m2} (ref >59)
Globulin, Total: 1.8 g/dL (ref 1.5–4.5)
Glucose: 99 mg/dL (ref 65–99)
Potassium: 3.6 mmol/L (ref 3.5–5.2)
Sodium: 139 mmol/L (ref 134–144)
Total Protein: 6.1 g/dL (ref 6.0–8.5)

## 2019-05-28 LAB — CBC WITH DIFFERENTIAL/PLATELET
Basophils Absolute: 0 10*3/uL (ref 0.0–0.2)
Basos: 0 %
EOS (ABSOLUTE): 0.1 10*3/uL (ref 0.0–0.4)
Eos: 4 %
Hematocrit: 34.6 % (ref 34.0–46.6)
Hemoglobin: 11.1 g/dL (ref 11.1–15.9)
Immature Grans (Abs): 0 10*3/uL (ref 0.0–0.1)
Immature Granulocytes: 0 %
Lymphocytes Absolute: 0.9 10*3/uL (ref 0.7–3.1)
Lymphs: 27 %
MCH: 25.8 pg — ABNORMAL LOW (ref 26.6–33.0)
MCHC: 32.1 g/dL (ref 31.5–35.7)
MCV: 80 fL (ref 79–97)
Monocytes Absolute: 0.4 10*3/uL (ref 0.1–0.9)
Monocytes: 13 %
Neutrophils Absolute: 1.8 10*3/uL (ref 1.4–7.0)
Neutrophils: 56 %
Platelets: 247 10*3/uL (ref 150–450)
RBC: 4.31 x10E6/uL (ref 3.77–5.28)
RDW: 12.5 % (ref 11.7–15.4)
WBC: 3.2 10*3/uL — ABNORMAL LOW (ref 3.4–10.8)

## 2019-05-28 LAB — LIPID PANEL
Chol/HDL Ratio: 2.8 ratio (ref 0.0–4.4)
Cholesterol, Total: 210 mg/dL — ABNORMAL HIGH (ref 100–199)
HDL: 75 mg/dL (ref >39)
LDL Chol Calc (NIH): 118 mg/dL — ABNORMAL HIGH (ref 0–99)
Triglycerides: 94 mg/dL (ref 0–149)
VLDL Cholesterol Cal: 17 mg/dL (ref 5–40)

## 2019-05-28 LAB — HEMOGLOBIN A1C
Est. average glucose Bld gHb Est-mCnc: 131 mg/dL
Hgb A1c MFr Bld: 6.2 % — ABNORMAL HIGH (ref 4.8–5.6)

## 2019-05-28 NOTE — Patient Instructions (Addendum)
   If you have lab work done today you will be contacted with your lab results within the next 2 weeks.  If you have not heard from us then please contact us. The fastest way to get your results is to register for My Chart.   IF you received an x-ray today, you will receive an invoice from [facility]. Please contact [facility] at [phone] with questions or concerns regarding your invoice.   IF you received labwork today, you will receive an invoice from [facility]. Please contact [facility] at [phone] with questions or concerns regarding your invoice.   Our billing staff will not be able to assist you with questions regarding bills from these companies.  You will be contacted with the lab results as soon as they are available. The fastest way to get your results is to activate your My Chart account. Instructions are located on the last page of this paperwork. If you have not heard from us regarding the results in 2 weeks, please contact this office.     Health Maintenance After Age 65 After age 65, you are at a higher risk for certain long-term diseases and infections as well as injuries from falls. Falls are a major cause of broken bones and head injuries in people who are older than age 65. Getting regular preventive care can help to keep you healthy and well. Preventive care includes getting regular testing and making lifestyle changes as recommended by your health care provider. Talk with your health care provider about:  Which screenings and tests you should have. A screening is a test that checks for a disease when you have no symptoms.  A diet and exercise plan that is right for you. What should I know about screenings and tests to prevent falls? Screening and testing are the best ways to find a health problem early. Early diagnosis and treatment give you the best chance of managing medical conditions that are common after age 65. Certain conditions and  lifestyle choices may make you more likely to have a fall. Your health care provider may recommend:  Regular vision checks. Poor vision and conditions such as cataracts can make you more likely to have a fall. If you wear glasses, make sure to get your prescription updated if your vision changes.  Medicine review. Work with your health care provider to regularly review all of the medicines you are taking, including over-the-counter medicines. Ask your health care provider about any side effects that may make you more likely to have a fall. Tell your health care provider if any medicines that you take make you feel dizzy or sleepy.  Osteoporosis screening. Osteoporosis is a condition that causes the bones to get weaker. This can make the bones weak and cause them to break more easily.  Blood pressure screening. Blood pressure changes and medicines to control blood pressure can make you feel dizzy.  Strength and balance checks. Your health care provider may recommend certain tests to check your strength and balance while standing, walking, or changing positions.  Foot health exam. Foot pain and numbness, as well as not wearing proper footwear, can make you more likely to have a fall.  Depression screening. You may be more likely to have a fall if you have a fear of falling, feel emotionally low, or feel unable to do activities that you used to do.  Alcohol use screening. Using too much alcohol can affect your balance and may make you more likely to   have a fall. What actions can I take to lower my risk of falls? General instructions  Talk with your health care provider about your risks for falling. Tell your health care provider if: ? You fall. Be sure to tell your health care provider about all falls, even ones that seem minor. ? You feel dizzy, sleepy, or off-balance.  Take over-the-counter and prescription medicines only as told by your health care provider. These include any  supplements.  Eat a healthy diet and maintain a healthy weight. A healthy diet includes low-fat dairy products, low-fat (lean) meats, and fiber from whole grains, beans, and lots of fruits and vegetables. Home safety  Remove any tripping hazards, such as rugs, cords, and clutter.  Install safety equipment such as grab bars in bathrooms and safety rails on stairs.  Keep rooms and walkways well-lit. Activity   Follow a regular exercise program to stay fit. This will help you maintain your balance. Ask your health care provider what types of exercise are appropriate for you.  If you need a cane or walker, use it as recommended by your health care provider.  Wear supportive shoes that have nonskid soles. Lifestyle  Do not drink alcohol if your health care provider tells you not to drink.  If you drink alcohol, limit how much you have: ? 0-1 drink a day for women. ? 0-2 drinks a day for men.  Be aware of how much alcohol is in your drink. In the [place] one drink equals one typical bottle of beer (12 oz), one-half glass of wine (5 oz), or one shot of hard liquor (1 oz).  Do not use any products that contain nicotine or tobacco, such as cigarettes and e-cigarettes. If you need help quitting, ask your health care provider. Summary  Having a healthy lifestyle and getting preventive care can help to protect your health and wellness after age 65.  Screening and testing are the best way to find a health problem early and help you avoid having a fall. Early diagnosis and treatment give you the best chance for managing medical conditions that are more common for people who are older than age 65.  Falls are a major cause of broken bones and head injuries in people who are older than age 65. Take precautions to prevent a fall at home.  Work with your health care provider to learn what changes you can make to improve your health and wellness and to prevent falls. This information is not intended  to replace advice given to you by your health care provider. Make sure you discuss any questions you have with your health care provider. Document Revised: 08/10/2018 Document Reviewed: 03/02/2017 Elsevier Patient Education  2020 Elsevier Inc.  

## 2019-05-28 NOTE — Progress Notes (Signed)
BP Readings from Last 3 Encounters:  05/28/19 122/77  10/31/18 109/67  10/25/18 117/70   Wt Readings from Last 3 Encounters:  05/28/19 278 lb 6.4 oz (126.3 kg)  10/31/18 264 lb (119.7 kg)  10/25/18 270 lb (122.5 kg)   [name] [name] 69 y.o.   Chief Complaint  Patient presents with  . Annual Exam    annual physical with lower back spasm    HISTORY OF PRESENT ILLNESS: This is a 69 y.o. female with multiple chronic medical problems here for follow-up: 1.  Seizure disorder: On Tegretol and Keppra with regular neurology follow-ups.  Doing well.  Seizure-free for 1 year. 2.  Hypertension: On carvedilol and Zestoretic. 3.  Dyslipidemia: On simvastatin 20 mg daily 4.  OSA: On CPAP therapy. Had lumbar spasms 2 weeks ago but is better now. No other complaints or medical concerns today.  HPI   Prior to Admission medications   Medication Sig Start Date End Date Taking? Authorizing Provider  aspirin 81 MG chewable tablet Chew 81 mg by mouth daily.    [provider]  carbamazepine (TEGRETOL) 200 MG tablet HALF TABLET BY MOUTH TWICE DAILY FOR 2 WEEKS. THEN ONE TABLET TWICE DAILY 01/16/19   Glean Salvo, NP  carvedilol (COREG) 3.125 MG tablet TAKE 1 TABLET(3.125 MG) BY MOUTH TWICE DAILY WITH A MEAL 01/15/19   Plummer Matich, Eilleen Kempf, MD  cholecalciferol (VITAMIN D3) 25 MCG (1000 UT) tablet Take 1,000 Units by mouth daily.    [provider]  escitalopram (LEXAPRO) 10 MG tablet TAKE 1 TABLET(10 MG) BY MOUTH AT BEDTIME 04/04/19   Georgina Quint, MD  levETIRAcetam (KEPPRA) 750 MG tablet TAKE 2 TABLETS(1500 MG) BY MOUTH TWICE DAILY 04/18/19   Glean Salvo, NP  lisinopril-hydrochlorothiazide (ZESTORETIC) 20-25 MG tablet TAKE 1 TABLET BY MOUTH DAILY. 04/04/19   Georgina Quint, MD  Multiple Vitamins-Minerals (MULTIVITAMIN PO) Take 1 tablet by mouth daily.    [provider]  simvastatin (ZOCOR) 20 MG tablet TAKE 1 TABLET(20 MG) BY MOUTH DAILY 01/31/19    Georgina Quint, MD    No Known Allergies  Patient Active Problem List   Diagnosis Date Noted  . Seizure disorder (HCC) 05/28/2019  . Prediabetes 05/28/2019  . Staring episodes 11/24/2017  . OSA (obstructive sleep apnea) 11/30/2016  . Chronic insomnia 10/13/2016  . Transient cerebral ischemia 08/26/2016  . Transient memory loss 08/26/2016  . Morbid obesity (HCC) 05/31/2016  . Glucose intolerance (impaired glucose tolerance) 05/18/2016  . 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 Generalized anxiety disorder 03/15/2014  . Memory difficulty 03/14/2014  . Demand myocardial ischemic related infarction 01/23/2014    Class: Diagnosis of  . Depression, reactive 10/12/2012  . Chronic neutropenia (HCC) 04/14/2012  . Hyperlipemia 10/20/2011  . HTN (hypertension) 10/20/2011    Past Medical History:  Diagnosis Date  . Anxiety   . Cataract   . Chronic insomnia 10/13/2016  . Depression   . Gait disorder 03/14/2014  . HTN (hypertension)   . Hx of cardiovascular stress test    Lexiscan Myoview (1/16):  No ischemia, EF 68%, breast atten; Low Risk  . Hyperlipemia   . Memory difficulty 03/14/2014  . Meningitis due to bacteria 01-18-14  . OSA (obstructive sleep apnea) 11/30/2016  . Seizures (HCC)   . TIA (transient ischemic attack)     Past Surgical History:  Procedure Laterality Date  . NO PAST SURGERIES      Social History   Socioeconomic History  .  Marital status: Divorced    Spouse name: Not on file  . Number of children: 1  . Years of education: college  . Highest education level: Some college, no degree  Occupational History  . Occupation: Retired Scientist, research (physical sciences): [employer]  Tobacco Use  . Smoking status: Former Smoker    Packs/day: 0.50    Years: 40.00    Pack years: 20.00    Types: Cigarettes    Quit date: 07/09/2011    Years since quitting: 7.8  . Smokeless tobacco: Never Used  Substance and Sexual Activity  . Alcohol use: Yes    Alcohol/week: 0.0  standard drinks    Comment: Rare  . Drug use: No  . Sexual activity: Never    Birth control/protection: Abstinence  Other Topics Concern  . Not on file  Social History Narrative   Marital status: divorced x 35 years; not dating; not interested in 2019.      Children: 1 child/son (44); 2 grandchildren; [place]      Lives:  Alone in [place] in [place].      Employment: retired 2008; Engineer, technical sales.      Tobacco: quit smoking in 2013.  Smoked x many years       Alcohol: none; holidays only      Exercise: none; quit exercising two years ago in 2015.        Seatbelt: 100%; no texting. Does not drive in [address] due to seizure activity.       ADLs: no driving in [place] due to seizure activity; independent with ADLs.  Has cane for long distances      Advanced Directives: none; [name]/sister.  FULL CODE no prolonged measures.         Patient is single lives at home alone, has 1 child   Patient is right handed   Education level is 1 year of college   Caffeine consumption is 2 cups daily   Social Determinants of Health   Financial Resource Strain:   . Difficulty of Paying Living Expenses: Not on file  Food Insecurity:   . Worried About Charity fundraiser in the Last Year: Not on file  . Ran Out of Food in the Last Year: Not on file  Transportation Needs:   . Lack of Transportation (Medical): Not on file  . Lack of Transportation (Non-Medical): Not on file  Physical Activity:   . Days of Exercise per Week: Not on file  . Minutes of Exercise per Session: Not on file  Stress:   . Feeling of Stress : Not on file  Social Connections:   . Frequency of Communication with Friends and Family: Not on file  . Frequency of Social Gatherings with Friends and Family: Not on file  . Attends Religious Services: Not on file  . Active Member of Clubs or Organizations: Not on file  . Attends Archivist Meetings: Not on file  . Marital Status: Not on file    Intimate Partner Violence:   . Fear of Current or Ex-Partner: Not on file  . Emotionally Abused: Not on file  . Physically Abused: Not on file  . Sexually Abused: Not on file    Family History  Problem Relation Age of Onset  . Heart attack Mother 65  . Stroke Mother   . Hypertension Mother   . Heart attack Father 77  . Cerebral aneurysm Sister   . Hypertension Sister   . Kidney disease Sister  ESRD; HD in last year  . Hypertension Brother   . Hyperlipidemia Brother   . Rheum arthritis Sister   . Arthritis Sister   . Hypertension Brother   . Hyperlipidemia Brother   . Hyperlipidemia Brother   . Hypertension Brother      Review of Systems  Constitutional: Negative.  Negative for fever and malaise/fatigue.  HENT: Negative.  Negative for congestion and sore throat.   Respiratory: Negative.  Negative for cough and shortness of breath.   Cardiovascular: Negative.  Negative for chest pain and palpitations.  Gastrointestinal: Negative.  Negative for abdominal pain, blood in stool, diarrhea, melena, nausea and vomiting.  Genitourinary: Negative.  Negative for dysuria and hematuria.  Musculoskeletal: Positive for back pain.  Skin: Negative.  Negative for rash.  Neurological: Negative.  Negative for dizziness and headaches.  All other systems reviewed and are negative.  Today's Vitals   05/28/19 0904  BP: 122/77  Pulse: 78  Temp: 98 F (36.7 C)  TempSrc: Temporal  SpO2: 98%  Weight: 278 lb 6.4 oz (126.3 kg)  Height: 5' 5.5" (1.664 m)   Body mass index is 45.62 kg/m.   Physical Exam Vitals reviewed.  Constitutional:      Appearance: Normal appearance.  HENT:     Head: Normocephalic.  Eyes:     Extraocular Movements: Extraocular movements intact.     Conjunctiva/sclera: Conjunctivae normal.     Pupils: Pupils are equal, round, and reactive to light.  Cardiovascular:     Rate and Rhythm: Normal rate and regular rhythm.     Pulses: Normal pulses.     Heart  sounds: Normal heart sounds.  Pulmonary:     Effort: Pulmonary effort is normal.     Breath sounds: Normal breath sounds.  Musculoskeletal:        General: Normal range of motion.     Cervical back: Normal range of motion and neck supple.     Right lower leg: No edema.     Left lower leg: No edema.  Skin:    General: Skin is warm and dry.     Capillary Refill: Capillary refill takes less than 2 seconds.  Neurological:     General: No focal deficit present.     Mental Status: She is alert and oriented to person, place, and time.  Psychiatric:        Mood and Affect: Mood normal.        Behavior: Behavior normal.    A total of 30 minutes was spent with the patient, greater than 50% of which was in counseling/coordination of care regarding chronic medical problems, management including medications diet and nutrition, review of most recent office visit notes, review of most recent blood work, review of most recent colonoscopy, DEXA scan, mammogram, health maintenance issues including need for Tdap, weight loss options, prognosis and need for follow-up.   ASSESSMENT & PLAN: [name] [name] was seen today for annual exam.  Diagnoses and all orders for this visit:  Essential hypertension -     Comprehensive metabolic panel -     CBC with Differential  Pure hypercholesterolemia -     Lipid Panel  OSA (obstructive sleep apnea)  Prediabetes -     CBC with Differential -     Hemoglobin A1c  Seizure disorder (HCC) -     CBC with Differential  Muscle spasm of back  Need for diphtheria-tetanus-pertussis (Tdap) vaccine -     Tdap vaccine greater than or equal to 7yo IM  Patient Instructions       If you have lab work done today you will be contacted with your lab results within the next 2 weeks.  If you have not heard from [place] then please contact us. The fastest way to get your results is to register for My Chart.   IF you received an x-ray today, you will receive an invoice from  [facility]. Please contact [facility] at [phone] with questions or concerns regarding your invoice.   IF you received labwork today, you will receive an invoice from [facility]. Please contact [facility] at [phone] with questions or concerns regarding your invoice.   Our billing staff will not be able to assist you with questions regarding bills from these companies.  You will be contacted with the lab results as soon as they are available. The fastest way to get your results is to activate your My Chart account. Instructions are located on the last page of this paperwork. If you have not heard from [place] regarding the results in 2 weeks, please contact this office.      Health Maintenance After Age 14 After age 49, you are at a higher risk for certain long-term diseases and infections as well as injuries from falls. Falls are a major cause of broken bones and head injuries in people who are older than age 105. Getting regular preventive care can help to keep you healthy and well. Preventive care includes getting regular testing and making lifestyle changes as recommended by your health care provider. Talk with your health care provider about:  Which screenings and tests you should have. A screening is a test that checks for a disease when you have no symptoms.  A diet and exercise plan that is right for you. What should I know about screenings and tests to prevent falls? Screening and testing are the best ways to find a health problem early. Early diagnosis and treatment give you the best chance of managing medical conditions that are common after age 44. Certain conditions and lifestyle choices may make you more likely to have a fall. Your health care provider may recommend:  Regular vision checks. Poor vision and conditions such as cataracts can make you more likely to have a fall. If you wear glasses, make sure to get your prescription updated if your vision  changes.  Medicine review. Work with your health care provider to regularly review all of the medicines you are taking, including over-the-counter medicines. Ask your health care provider about any side effects that may make you more likely to have a fall. Tell your health care provider if any medicines that you take make you feel dizzy or sleepy.  Osteoporosis screening. Osteoporosis is a condition that causes the bones to get weaker. This can make the bones weak and cause them to break more easily.  Blood pressure screening. Blood pressure changes and medicines to control blood pressure can make you feel dizzy.  Strength and balance checks. Your health care provider may recommend certain tests to check your strength and balance while standing, walking, or changing positions.  Foot health exam. Foot pain and numbness, as well as not wearing proper footwear, can make you more likely to have a fall.  Depression screening. You may be more likely to have a fall if you have a fear of falling, feel emotionally low, or feel unable to do activities that you used to do.  Alcohol use screening. Using too much alcohol can affect your balance  and may make you more likely to have a fall. What actions can I take to lower my risk of falls? General instructions  Talk with your health care provider about your risks for falling. Tell your health care provider if: ? You fall. Be sure to tell your health care provider about all falls, even ones that seem minor. ? You feel dizzy, sleepy, or off-balance.  Take over-the-counter and prescription medicines only as told by your health care provider. These include any supplements.  Eat a healthy diet and maintain a healthy weight. A healthy diet includes low-fat dairy products, low-fat (lean) meats, and fiber from whole grains, beans, and lots of fruits and vegetables. Home safety  Remove any tripping hazards, such as rugs, cords, and clutter.  Install safety  equipment such as grab bars in bathrooms and safety rails on stairs.  Keep rooms and walkways well-lit. Activity   Follow a regular exercise program to stay fit. This will help you maintain your balance. Ask your health care provider what types of exercise are appropriate for you.  If you need a cane or walker, use it as recommended by your health care provider.  Wear supportive shoes that have nonskid soles. Lifestyle  Do not drink alcohol if your health care provider tells you not to drink.  If you drink alcohol, limit how much you have: ? 0-1 drink a day for women. ? 0-2 drinks a day for men.  Be aware of how much alcohol is in your drink. In the [place] one drink equals one typical bottle of beer (12 oz), one-half glass of wine (5 oz), or one shot of hard liquor (1 oz).  Do not use any products that contain nicotine or tobacco, such as cigarettes and e-cigarettes. If you need help quitting, ask your health care provider. Summary  Having a healthy lifestyle and getting preventive care can help to protect your health and wellness after age 3.  Screening and testing are the best way to find a health problem early and help you avoid having a fall. Early diagnosis and treatment give you the best chance for managing medical conditions that are more common for people who are older than age 43.  Falls are a major cause of broken bones and head injuries in people who are older than age 58. Take precautions to prevent a fall at home.  Work with your health care provider to learn what changes you can make to improve your health and wellness and to prevent falls. This information is not intended to replace advice given to you by your health care provider. Make sure you discuss any questions you have with your health care provider. Document Revised: 08/10/2018 Document Reviewed: 03/02/2017 Elsevier Patient Education  2020 Elsevier Inc.      Edwina Barth, MD [facility]

## 2019-06-07 DIAGNOSIS — G4733 Obstructive sleep apnea (adult) (pediatric): Secondary | ICD-10-CM | POA: Diagnosis not present

## 2019-07-26 ENCOUNTER — Other Ambulatory Visit: Payer: Self-pay | Admitting: Emergency Medicine

## 2019-07-26 DIAGNOSIS — I10 Essential (primary) hypertension: Secondary | ICD-10-CM

## 2019-07-26 MED ORDER — SIMVASTATIN 20 MG PO TABS: ORAL_TABLET | ORAL | 0 refills | Status: DC

## 2019-07-26 MED ORDER — CARVEDILOL 3.125 MG PO TABS: ORAL_TABLET | ORAL | 0 refills | Status: DC

## 2019-07-26 NOTE — Telephone Encounter (Signed)
Approved per protocol.  

## 2019-07-26 NOTE — Telephone Encounter (Signed)
Medication Refill - Medication:  carvedilol (COREG) 3.125 MG tablet  simvastatin (ZOCOR) 20 MG tablet  Has the patient contacted their pharmacy?  Yes advised to call office. Patient stated she is going to be out 2-3  Preferred Pharmacy (with phone number or street name):  [id number] [place], [place] - [address] AT [facility] [address] Phone:  [phone]  Fax:  [phone]     Agent: Please be advised that RX refills may take up to 3 business days. We ask that you follow-up with your pharmacy.

## 2019-08-22 ENCOUNTER — Telehealth: Payer: Self-pay | Admitting: Emergency Medicine

## 2019-08-22 ENCOUNTER — Encounter: Payer: Self-pay | Admitting: Emergency Medicine

## 2019-08-22 ENCOUNTER — Other Ambulatory Visit: Payer: Self-pay

## 2019-08-22 ENCOUNTER — Telehealth (INDEPENDENT_AMBULATORY_CARE_PROVIDER_SITE_OTHER): Payer: Medicare Other | Admitting: Emergency Medicine

## 2019-08-22 VITALS — Wt 273.0 lb

## 2019-08-22 DIAGNOSIS — R1084 Generalized abdominal pain: Secondary | ICD-10-CM | POA: Diagnosis not present

## 2019-08-22 DIAGNOSIS — G4733 Obstructive sleep apnea (adult) (pediatric): Secondary | ICD-10-CM | POA: Diagnosis not present

## 2019-08-22 NOTE — Progress Notes (Signed)
Pt having abdominal pain x 2 weeks and now having dry cough

## 2019-08-22 NOTE — Patient Instructions (Signed)
° ° ° °  If you have lab work done today you will be contacted with your lab results within the next 2 weeks.  If you have not heard from us then please contact us. The fastest way to get your results is to register for My Chart. ° ° °IF you received an x-ray today, you will receive an invoice from [facility]. Please contact [facility] at [phone] with questions or concerns regarding your invoice.  ° °IF you received labwork today, you will receive an invoice from [facility]. Please contact [facility] at [phone] with questions or concerns regarding your invoice.  ° °Our billing staff will not be able to assist you with questions regarding bills from these companies. ° °You will be contacted with the lab results as soon as they are available. The fastest way to get your results is to activate your My Chart account. Instructions are located on the last page of this paperwork. If you have not heard from us regarding the results in 2 weeks, please contact this office. °  ° ° ° °

## 2019-08-22 NOTE — Progress Notes (Signed)
Telemedicine Encounter- SOAP NOTE Established Patient  This telephone encounter was conducted with the patient's (or proxy's) verbal consent via audio telecommunications: yes/no: Yes Patient was instructed to have this encounter in a suitably private space; and to only have persons present to whom they give permission to participate. In addition, patient identity was confirmed by use of name plus two identifiers (DOB and address).  I discussed the limitations, risks, security and privacy concerns of performing an evaluation and management service by telephone and the availability of in person appointments. I also discussed with the patient that there may be a patient responsible charge related to this service. The patient expressed understanding and agreed to proceed.  I spent a total of TIME; 0 MIN TO 60 MIN: 15 minutes talking with the patient or their proxy.  Chief Complaint  Patient presents with  . Abdominal Pain    started about 2 weeks ago, tried changing diet to help with this with no change, feels like burning and churning feeling on her high abdominal, pt also notes she has a dry cough for 3 days so far, pain does not prevent her from normal activity, her stools are dark with small spots of blood, full bladder increases this pain    Subjective   Mariha Samanth Mirkin is a 69 y.o. female established patient. Telephone visit today complaining of upper abdominal pain that started 2 weeks ago and has been gradually getting worse.  Pain moved from left to right.  Burning sensation.  No diarrhea.  Has had a bowel movement every day.  Denies nausea or vomiting.  Denies fever or flulike symptoms.  Fully vaccinated against Covid.  Dark stools but no melena.  Took Tylenol without relief.  No other significant symptoms.  HPI   Patient Active Problem List   Diagnosis Date Noted  . Seizure disorder ([place]) 05/28/2019  . Prediabetes 05/28/2019  . Staring episodes 11/24/2017  . OSA (obstructive  sleep apnea) 11/30/2016  . Chronic insomnia 10/13/2016  . Transient cerebral ischemia 08/26/2016  . Transient memory loss 08/26/2016  . Morbid obesity ([place]) 05/31/2016  . Glucose intolerance (impaired glucose tolerance) 05/18/2016  . 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 Generalized anxiety disorder 03/15/2014  . Memory difficulty 03/14/2014  . Demand myocardial ischemic related infarction 01/23/2014    Class: Diagnosis of  . Depression, reactive 10/12/2012  . Chronic neutropenia ([place]) 04/14/2012  . Hyperlipemia 10/20/2011  . HTN (hypertension) 10/20/2011    Past Medical History:  Diagnosis Date  . Anxiety   . Cataract   . Chronic insomnia 10/13/2016  . Depression   . Gait disorder 03/14/2014  . HTN (hypertension)   . Hx of cardiovascular stress test    Lexiscan Myoview (1/16):  No ischemia, EF 68%, breast atten; Low Risk  . Hyperlipemia   . Memory difficulty 03/14/2014  . Meningitis due to bacteria 01-18-14  . OSA (obstructive sleep apnea) 11/30/2016  . Seizures ([place])   . TIA (transient ischemic attack)     Current Outpatient Medications  Medication Sig Dispense Refill  . aspirin 81 MG chewable tablet Chew 81 mg by mouth daily.    . carbamazepine (TEGRETOL) 200 MG tablet HALF TABLET BY MOUTH TWICE DAILY FOR 2 WEEKS. THEN ONE TABLET TWICE DAILY 180 tablet 3  . carvedilol (COREG) 3.125 MG tablet TAKE 1 TABLET(3.125 MG) BY MOUTH TWICE DAILY WITH A MEAL 180 tablet 0  . cholecalciferol (VITAMIN D3) 25 MCG (1000 UT) tablet Take 1,000 Units by  mouth daily.    Marland Kitchen escitalopram (LEXAPRO) 10 MG tablet TAKE 1 TABLET(10 MG) BY MOUTH AT BEDTIME 90 tablet 1  . levETIRAcetam (KEPPRA) 750 MG tablet TAKE 2 TABLETS(1500 MG) BY MOUTH TWICE DAILY 360 tablet 1  . lisinopril-hydrochlorothiazide (ZESTORETIC) 20-25 MG tablet TAKE 1 TABLET BY MOUTH DAILY. 90 tablet 1  . Multiple Vitamins-Minerals (MULTIVITAMIN PO) Take 1 tablet by mouth daily.    . simvastatin (ZOCOR) 20 MG tablet TAKE  1 TABLET(20 MG) BY MOUTH DAILY 90 tablet 0   No current facility-administered medications for this visit.    No Known Allergies  Social History   Socioeconomic History  . Marital status: Divorced    Spouse name: Not on file  . Number of children: 1  . Years of education: college  . Highest education level: Some college, no degree  Occupational History  . Occupation: Retired Financial planner: [employer]  Tobacco Use  . Smoking status: Former Smoker    Packs/day: 0.50    Years: 40.00    Pack years: 20.00    Types: Cigarettes    Quit date: 07/09/2011    Years since quitting: 8.1  . Smokeless tobacco: Never Used  Substance and Sexual Activity  . Alcohol use: Yes    Alcohol/week: 0.0 standard drinks    Comment: Rare  . Drug use: No  . Sexual activity: Never    Birth control/protection: Abstinence  Other Topics Concern  . Not on file  Social History Narrative   Marital status: divorced x 35 years; not dating; not interested in 2019.      Children: 1 child/son (44); 2 grandchildren; [place]lock      Lives:  Alone in [place] in [place].      Employment: retired 2008; Administrator, arts.      Tobacco: quit smoking in 2013.  Smoked x many years       Alcohol: none; holidays only      Exercise: none; quit exercising two years ago in 2015.        Seatbelt: 100%; no texting. Does not drive in [address]233 due to seizure activity.       ADLs: no driving in 0076-2263 due to seizure activity; independent with ADLs.  Has cane for long distances      Advanced Directives: none; [name]/sister.  FULL CODE no prolonged measures.         Patient is single lives at home alone, has 1 child   Patient is right handed   Education level is 1 year of college   Caffeine consumption is 2 cups daily   Social Determinants of Health   Financial Resource Strain:   . Difficulty of Paying Living Expenses:   Food Insecurity:   . Worried About Programme researcher, broadcasting/film/video in the Last Year:   . Garment/textile technologist in the Last Year:   Transportation Needs:   . Freight forwarder (Medical):   Marland Kitchen Lack of Transportation (Non-Medical):   Physical Activity:   . Days of Exercise per Week:   . Minutes of Exercise per Session:   Stress:   . Feeling of Stress :   Social Connections:   . Frequency of Communication with Friends and Family:   . Frequency of Social Gatherings with Friends and Family:   . Attends Religious Services:   . Active Member of Clubs or Organizations:   . Attends Banker Meetings:   Marland Kitchen Marital Status:   Intimate Programme researcher, broadcasting/film/video  Violence:   . Fear of Current or Ex-Partner:   . Emotionally Abused:   Marland Kitchen Physically Abused:   . Sexually Abused:     Review of Systems  Constitutional: Negative.  Negative for chills and fever.  HENT: Negative.  Negative for congestion and sore throat.   Respiratory: Negative.  Negative for cough and shortness of breath.   Cardiovascular: Negative.  Negative for chest pain and palpitations.  Gastrointestinal: Positive for abdominal pain. Negative for blood in stool, diarrhea, melena, nausea and vomiting.  Genitourinary: Negative.  Negative for dysuria, flank pain, frequency, hematuria and urgency.  Musculoskeletal: Negative.  Negative for back pain, myalgias and neck pain.  Skin: Negative.  Negative for rash.  Neurological: Negative.  Negative for dizziness and headaches.  All other systems reviewed and are negative.   Objective  Alert and oriented x3 in no apparent respiratory distress. Vitals as reported by the patient: Today's Vitals   08/22/19 1319  Weight: 273 lb (123.8 kg)    There are no diagnoses linked to this encounter. Wing was seen today for abdominal pain.  Diagnoses and all orders for this visit:  Generalized abdominal pain  Differential diagnosis discussed with patient.  ED precautions given.  Needs diagnostic work-up. Office visit in the next 48 hours.   I discussed the assessment and treatment plan with  the patient. The patient was provided an opportunity to ask questions and all were answered. The patient agreed with the plan and demonstrated an understanding of the instructions.   The patient was advised to call back or seek an in-person evaluation if the symptoms worsen or if the condition fails to improve as anticipated.  I provided 15 minutes of non-face-to-face time during this encounter.  Georgina Quint, MD  [facility] at [facility]

## 2019-08-24 ENCOUNTER — Ambulatory Visit (INDEPENDENT_AMBULATORY_CARE_PROVIDER_SITE_OTHER): Payer: Medicare Other | Admitting: Registered Nurse

## 2019-08-24 ENCOUNTER — Encounter: Payer: Self-pay | Admitting: Registered Nurse

## 2019-08-24 ENCOUNTER — Other Ambulatory Visit: Payer: Self-pay

## 2019-08-24 VITALS — BP 121/69 | HR 60 | Temp 97.8°F | Resp 16 | Ht 65.5 in | Wt 272.8 lb

## 2019-08-24 DIAGNOSIS — R1013 Epigastric pain: Secondary | ICD-10-CM | POA: Diagnosis not present

## 2019-08-24 MED ORDER — SUCRALFATE 1 GM/10ML PO SUSP: 1.0000 g | Freq: Three times a day (TID) | ORAL | 0 refills | Status: DC

## 2019-08-24 MED ORDER — ESOMEPRAZOLE MAGNESIUM 40 MG PO CPDR: 40.0000 mg | DELAYED_RELEASE_CAPSULE | Freq: Every day | ORAL | 3 refills | Status: DC

## 2019-08-24 NOTE — Patient Instructions (Signed)
° ° ° °  If you have lab work done today you will be contacted with your lab results within the next 2 weeks.  If you have not heard from us then please contact us. The fastest way to get your results is to register for My Chart. ° ° °IF you received an x-ray today, you will receive an invoice from [facility]. Please contact [facility] at [phone] with questions or concerns regarding your invoice.  ° °IF you received labwork today, you will receive an invoice from [facility]. Please contact [facility] at [phone] with questions or concerns regarding your invoice.  ° °Our billing staff will not be able to assist you with questions regarding bills from these companies. ° °You will be contacted with the lab results as soon as they are available. The fastest way to get your results is to activate your My Chart account. Instructions are located on the last page of this paperwork. If you have not heard from us regarding the results in 2 weeks, please contact this office. °  ° ° ° °

## 2019-08-24 NOTE — Telephone Encounter (Signed)
Pt called in and I put her on [place] schedule for today per Dr. Alvy Bimler request.

## 2019-08-25 ENCOUNTER — Encounter: Payer: Self-pay | Admitting: Registered Nurse

## 2019-08-25 LAB — CMP14+EGFR
ALT: 8 IU/L (ref 0–32)
AST: 12 IU/L (ref 0–40)
Albumin/Globulin Ratio: 2.5 — ABNORMAL HIGH (ref 1.2–2.2)
Albumin: 4.5 g/dL (ref 3.8–4.8)
Alkaline Phosphatase: 129 IU/L — ABNORMAL HIGH (ref 39–117)
BUN/Creatinine Ratio: 19 (ref 12–28)
BUN: 16 mg/dL (ref 8–27)
Bilirubin Total: <0.2 mg/dL (ref 0.0–1.2)
CO2: 26 mmol/L (ref 20–29)
Calcium: 9.9 mg/dL (ref 8.7–10.3)
Chloride: 101 mmol/L (ref 96–106)
Creatinine, Ser: 0.83 mg/dL (ref 0.57–1.00)
GFR calc Af Amer: 84 mL/min/{1.73_m2} (ref >59)
GFR calc non Af Amer: 73 mL/min/{1.73_m2} (ref >59)
Globulin, Total: 1.8 g/dL (ref 1.5–4.5)
Glucose: 90 mg/dL (ref 65–99)
Potassium: 3.5 mmol/L (ref 3.5–5.2)
Sodium: 143 mmol/L (ref 134–144)
Total Protein: 6.3 g/dL (ref 6.0–8.5)

## 2019-08-25 LAB — CBC WITH DIFFERENTIAL/PLATELET
Basophils Absolute: 0 10*3/uL (ref 0.0–0.2)
Basos: 1 %
EOS (ABSOLUTE): 0.1 10*3/uL (ref 0.0–0.4)
Eos: 4 %
Hematocrit: 35.9 % (ref 34.0–46.6)
Hemoglobin: 11.6 g/dL (ref 11.1–15.9)
Immature Grans (Abs): 0 10*3/uL (ref 0.0–0.1)
Immature Granulocytes: 0 %
Lymphocytes Absolute: 1.1 10*3/uL (ref 0.7–3.1)
Lymphs: 35 %
MCH: 26.1 pg — ABNORMAL LOW (ref 26.6–33.0)
MCHC: 32.3 g/dL (ref 31.5–35.7)
MCV: 81 fL (ref 79–97)
Monocytes Absolute: 0.3 10*3/uL (ref 0.1–0.9)
Monocytes: 9 %
Neutrophils Absolute: 1.6 10*3/uL (ref 1.4–7.0)
Neutrophils: 51 %
Platelets: 252 10*3/uL (ref 150–450)
RBC: 4.44 x10E6/uL (ref 3.77–5.28)
RDW: 12.5 % (ref 11.7–15.4)
WBC: 3.1 10*3/uL — ABNORMAL LOW (ref 3.4–10.8)

## 2019-08-25 LAB — LIPASE: Lipase: 20 U/L (ref 14–72)

## 2019-08-25 LAB — AMYLASE: Amylase: 74 U/L (ref 31–110)

## 2019-08-25 NOTE — Progress Notes (Signed)
Labs not concerning Letter sent via MyChart  [name] Tresia Revolorio, NP 

## 2019-08-25 NOTE — Progress Notes (Signed)
Acute Office Visit  Subjective:    Patient ID: [name] [name], female    DOB: February 22, 1951, 69 y.o.   MRN: [id number]  Chief Complaint  Patient presents with  . Abdominal Pain    patient states she has been having abdominal pain for two weeks gets worse at night. Per patient she thought it was acid reflux but its not in the chest. Feels like a constant pain that sometimes move to the breast area. Stool is darker than normal stated no its not black its just darker for two weeks as well    HPI Patient is in today for follow up to telemed - Recently seen by PCP Dr. Mitchel Honour for abdominal pain.  Briefly: generalized, but mostly epigastric. Burning in nature. Worse at night. Dark stools but denies tarry quality, melena. No brbpr, nvd, fevers, rigid abdomen, hx of abdominal surgery, abdominal tenderness.  Notes that about two weeks ago she started taking an apple cider vinegar culture for gut health, and it seems her symptoms worsened after this.   Has tried Tums with some relief, but not much. Pain is waking her at night.  Diet has not changed - no food after 7pm, no caffeine after noon, no alcohol, no obvious triggers for GERD  Does have a substantial history of GERD that she had been controlling with lifestyle modification  Had previously been losing weight - but unfortunately recently gained 11lbs  Past Medical History:  Diagnosis Date  . Anxiety   . Cataract   . Chronic insomnia 10/13/2016  . Depression   . Gait disorder 03/14/2014  . HTN (hypertension)   . Hx of cardiovascular stress test    Lexiscan Myoview (1/16):  No ischemia, EF 68%, breast atten; Low Risk  . Hyperlipemia   . Memory difficulty 03/14/2014  . Meningitis due to bacteria 01-18-14  . OSA (obstructive sleep apnea) 11/30/2016  . Seizures ([place])   . TIA (transient ischemic attack)     Past Surgical History:  Procedure Laterality Date  . NO PAST SURGERIES      Family History  Problem Relation Age of  Onset  . Heart attack Mother 24  . Stroke Mother   . Hypertension Mother   . Heart attack Father 68  . Cerebral aneurysm Sister   . Hypertension Sister   . Kidney disease Sister        ESRD; HD in last year  . Hypertension Brother   . Hyperlipidemia Brother   . Rheum arthritis Sister   . Arthritis Sister   . Hypertension Brother   . Hyperlipidemia Brother   . Hyperlipidemia Brother   . Hypertension Brother     Social History   Socioeconomic History  . Marital status: Divorced    Spouse name: Not on file  . Number of children: 1  . Years of education: college  . Highest education level: Some college, no degree  Occupational History  . Occupation: Retired Scientist, research (physical sciences): [employer]  Tobacco Use  . Smoking status: Former Smoker    Packs/day: 0.50    Years: 40.00    Pack years: 20.00    Types: Cigarettes    Quit date: 07/09/2011    Years since quitting: 8.1  . Smokeless tobacco: Never Used  Substance and Sexual Activity  . Alcohol use: Yes    Alcohol/week: 0.0 standard drinks    Comment: Rare  . Drug use: No  . Sexual activity: Never    Birth control/protection: Abstinence  Other Topics Concern  . Not on file  Social History Narrative   Marital status: divorced x 35 years; not dating; not interested in 2019.      Children: 1 child/son (44); 2 grandchildren; [place]lock      Lives:  Alone in [place] in [place].      Employment: retired 2008; Engineer, technical sales.      Tobacco: quit smoking in 2013.  Smoked x many years       Alcohol: none; holidays only      Exercise: none; quit exercising two years ago in 2015.        Seatbelt: 100%; no texting. Does not drive in 3536 due to seizure activity.       ADLs: no driving in 1443-1540 due to seizure activity; independent with ADLs.  Has cane for long distances      Advanced Directives: none; [name]/sister.  FULL CODE no prolonged measures.         Patient is single lives at home alone, has 1 child    Patient is right handed   Education level is 1 year of college   Caffeine consumption is 2 cups daily   Social Determinants of Health   Financial Resource Strain:   . Difficulty of Paying Living Expenses:   Food Insecurity:   . Worried About Charity fundraiser in the Last Year:   . Arboriculturist in the Last Year:   Transportation Needs:   . Film/video editor (Medical):   Marland Kitchen Lack of Transportation (Non-Medical):   Physical Activity:   . Days of Exercise per Week:   . Minutes of Exercise per Session:   Stress:   . Feeling of Stress :   Social Connections:   . Frequency of Communication with Friends and Family:   . Frequency of Social Gatherings with Friends and Family:   . Attends Religious Services:   . Active Member of Clubs or Organizations:   . Attends Archivist Meetings:   Marland Kitchen Marital Status:   Intimate Partner Violence:   . Fear of Current or Ex-Partner:   . Emotionally Abused:   Marland Kitchen Physically Abused:   . Sexually Abused:     Outpatient Medications Prior to Visit  Medication Sig Dispense Refill  . aspirin 81 MG chewable tablet Chew 81 mg by mouth daily.    . carbamazepine (TEGRETOL) 200 MG tablet HALF TABLET BY MOUTH TWICE DAILY FOR 2 WEEKS. THEN ONE TABLET TWICE DAILY 180 tablet 3  . carvedilol (COREG) 3.125 MG tablet TAKE 1 TABLET(3.125 MG) BY MOUTH TWICE DAILY WITH A MEAL 180 tablet 0  . cholecalciferol (VITAMIN D3) 25 MCG (1000 UT) tablet Take 1,000 Units by mouth daily.    Marland Kitchen escitalopram (LEXAPRO) 10 MG tablet TAKE 1 TABLET(10 MG) BY MOUTH AT BEDTIME 90 tablet 1  . levETIRAcetam (KEPPRA) 750 MG tablet TAKE 2 TABLETS(1500 MG) BY MOUTH TWICE DAILY 360 tablet 1  . lisinopril-hydrochlorothiazide (ZESTORETIC) 20-25 MG tablet TAKE 1 TABLET BY MOUTH DAILY. 90 tablet 1  . Multiple Vitamins-Minerals (MULTIVITAMIN PO) Take 1 tablet by mouth daily.    . simvastatin (ZOCOR) 20 MG tablet TAKE 1 TABLET(20 MG) BY MOUTH DAILY 90 tablet 0   No facility-administered  medications prior to visit.    No Known Allergies  Review of Systems  Constitutional: Negative.   HENT: Negative.   Eyes: Negative.   Respiratory: Negative.   Cardiovascular: Negative.   Gastrointestinal: Positive for abdominal pain. Negative  for abdominal distention, anal bleeding, blood in stool, constipation, diarrhea, nausea, rectal pain and vomiting.  Endocrine: Negative.   Genitourinary: Negative.   Musculoskeletal: Negative.   Skin: Negative.   Allergic/Immunologic: Negative.   Neurological: Negative.   Hematological: Negative.   Psychiatric/Behavioral: Negative.   All other systems reviewed and are negative.      Objective:    Physical Exam Vitals reviewed.  Constitutional:      General: She is not in acute distress.    Appearance: She is well-developed. She is obese. She is not ill-appearing, toxic-appearing or diaphoretic.  Cardiovascular:     Rate and Rhythm: Normal rate and regular rhythm.  Abdominal:     General: Abdomen is flat. Bowel sounds are normal. There is no distension or abdominal bruit.     Palpations: Abdomen is soft. There is no shifting dullness or pulsatile mass.     Tenderness: There is abdominal tenderness in the epigastric area. There is no right CVA tenderness or left CVA tenderness. Negative signs include Murphy's sign, Rovsing's sign and McBurney's sign.  Skin:    General: Skin is warm and dry.     Capillary Refill: Capillary refill takes 2 to 3 seconds.     Coloration: Skin is not cyanotic, jaundiced, mottled or pale.     Findings: No erythema or rash.  Neurological:     General: No focal deficit present.     Mental Status: She is alert and oriented to person, place, and time.     Cranial Nerves: No cranial nerve deficit.  Psychiatric:        Mood and Affect: Mood normal. Mood is not depressed.        Behavior: Behavior normal.     BP 121/69   Pulse 60   Temp 97.8 F (36.6 C) (Temporal)   Resp 16   Ht 5' 5.5" (1.664 m)   Wt  272 lb 12.8 oz (123.7 kg)   SpO2 96%   BMI 44.71 kg/m  Wt Readings from Last 3 Encounters:  08/24/19 272 lb 12.8 oz (123.7 kg)  08/22/19 273 lb (123.8 kg)  05/28/19 278 lb 6.4 oz (126.3 kg)    There are no preventive care reminders to display for this patient.  There are no preventive care reminders to display for this patient.   Lab Results  Component Value Date   TSH 2.570 05/26/2018   Lab Results  Component Value Date   WBC 3.1 (L) 08/24/2019   HGB 11.6 08/24/2019   HCT 35.9 08/24/2019   MCV 81 08/24/2019   PLT 252 08/24/2019   Lab Results  Component Value Date   NA 143 08/24/2019   K 3.5 08/24/2019   CO2 26 08/24/2019   GLUCOSE 90 08/24/2019   BUN 16 08/24/2019   CREATININE 0.83 08/24/2019   BILITOT <0.2 08/24/2019   ALKPHOS 129 (H) 08/24/2019   AST 12 08/24/2019   ALT 8 08/24/2019   PROT 6.3 08/24/2019   ALBUMIN 4.5 08/24/2019   CALCIUM 9.9 08/24/2019   ANIONGAP 9 09/25/2016   GFR 95.88 05/17/2014   Lab Results  Component Value Date   CHOL 210 (H) 05/28/2019   Lab Results  Component Value Date   HDL 75 05/28/2019   Lab Results  Component Value Date   LDLCALC 118 (H) 05/28/2019   Lab Results  Component Value Date   TRIG 94 05/28/2019   Lab Results  Component Value Date   CHOLHDL 2.8 05/28/2019   Lab Results  Component Value Date   HGBA1C 6.2 (H) 05/28/2019       Assessment & Plan:   Problem List Items Addressed This Visit    None    Visit Diagnoses    Abdominal pain, epigastric    -  Primary   Relevant Medications   esomeprazole (NEXIUM) 40 MG capsule   sucralfate (CARAFATE) 1 GM/10ML suspension   Other Relevant Orders   CBC with Differential (Completed)   Amylase (Completed)   Lipase (Completed)   CMP14+EGFR (Completed)       Meds ordered this encounter  Medications  . esomeprazole (NEXIUM) 40 MG capsule    Sig: Take 1 capsule (40 mg total) by mouth daily.    Dispense:  30 capsule    Refill:  3    Order Specific  Question:   Supervising Provider    Answer:   Delia Chimes A [id number]  . sucralfate (CARAFATE) 1 GM/10ML suspension    Sig: Take 10 mLs (1 g total) by mouth 4 (four) times daily -  with meals and at bedtime.    Dispense:  420 mL    Refill:  0    Order Specific Question:   Supervising Provider    Answer:   Forrest Moron [id number]   PLAN  Exam with some epigastric tenderness  Labs collected, will follow up as warranted  Feel this is likely GERD given history and exam - will try esomeprazole and sucralfate. Pt to follow up if symptoms worsen or fail to improve  Patient encouraged to call clinic with any questions, comments, or concerns.  Maximiano Coss, NP

## 2019-08-27 ENCOUNTER — Other Ambulatory Visit: Payer: Self-pay | Admitting: Emergency Medicine

## 2019-08-27 DIAGNOSIS — Z1231 Encounter for screening mammogram for malignant neoplasm of breast: Secondary | ICD-10-CM

## 2019-10-02 ENCOUNTER — Other Ambulatory Visit: Payer: Self-pay

## 2019-10-02 ENCOUNTER — Ambulatory Visit
Admission: RE | Admit: 2019-10-02 | Discharge: 2019-10-02 | Disposition: A | Discharge status: Home / Self Care | Payer: Medicare Other | Source: Ambulatory Visit | Attending: Emergency Medicine | Admitting: Emergency Medicine

## 2019-10-02 DIAGNOSIS — Z1231 Encounter for screening mammogram for malignant neoplasm of breast: Secondary | ICD-10-CM

## 2019-10-03 ENCOUNTER — Encounter: Payer: Self-pay | Admitting: Emergency Medicine

## 2019-10-04 ENCOUNTER — Other Ambulatory Visit: Payer: Self-pay | Admitting: Emergency Medicine

## 2019-10-19 ENCOUNTER — Other Ambulatory Visit: Payer: Self-pay

## 2019-10-19 DIAGNOSIS — I10 Essential (primary) hypertension: Secondary | ICD-10-CM

## 2019-10-19 MED ORDER — CARVEDILOL 3.125 MG PO TABS: ORAL_TABLET | ORAL | 0 refills | Status: DC

## 2019-10-23 ENCOUNTER — Encounter: Payer: Self-pay | Admitting: Neurology

## 2019-10-23 ENCOUNTER — Telehealth: Payer: Self-pay | Admitting: Neurology

## 2019-10-23 ENCOUNTER — Ambulatory Visit: Payer: Medicare Other | Admitting: Neurology

## 2019-10-23 VITALS — BP 119/70 | HR 62 | Ht 65.0 in | Wt 273.0 lb

## 2019-10-23 DIAGNOSIS — Z9989 Dependence on other enabling machines and devices: Secondary | ICD-10-CM | POA: Diagnosis not present

## 2019-10-23 DIAGNOSIS — G4733 Obstructive sleep apnea (adult) (pediatric): Secondary | ICD-10-CM

## 2019-10-23 DIAGNOSIS — G40909 Epilepsy, unspecified, not intractable, without status epilepticus: Secondary | ICD-10-CM | POA: Diagnosis not present

## 2019-10-23 MED ORDER — LEVETIRACETAM 750 MG PO TABS: ORAL_TABLET | ORAL | 4 refills | Status: DC

## 2019-10-23 MED ORDER — CARBAMAZEPINE 200 MG PO TABS: 200.0000 mg | ORAL_TABLET | Freq: Two times a day (BID) | ORAL | 4 refills | Status: DC

## 2019-10-23 NOTE — Progress Notes (Signed)
A community message has been sent to [facility] for cpap orders

## 2019-10-23 NOTE — Progress Notes (Signed)
I have read the note, and I agree with the clinical assessment and plan.  Traylon Schimming K Clarann Helvey   

## 2019-10-23 NOTE — Progress Notes (Signed)
PATIENT: [name] [name] DOB: Oct 03, 1950  REASON FOR VISIT: follow up HISTORY FROM: patient  HISTORY OF PRESENT ILLNESS: Today 10/23/19  [name] [name] is a 69 year old female with history of episodes of staring off, likely representing seizures.  She remains on carbamazepine and Keppra.  Her last seizure occurred in May 2019.  She is on CPAP.  Her CPAP download indicates excellent compliance, greater than 4 hours of use 100%, 30/30 days, average usage was 7 hours and 51 minutes.  Set pressure is 13 cmH2O.  Leak in the 95th percentile is 1.7, AHI is 1.2.  She indicates her equipment is working properly, denies significant daytime drowsiness.  ESS was 10.  She does have fatigue felt to be related to being overweight.  She has restarted working on dieting.  Denies any new problems or concerns.  Lives alone, drives a car without difficulty.  Presents today for evaluation unaccompanied.  HISTORY 04/18/2019 SS: [name] [name] is a 69 year old female with history of episodes of staring, likely representing seizures.  She remains on carbamazepine 200 mg twice daily, Keppra 750 mg tablet, 2 tablets twice daily.  Her last seizure occurred in May 2019.  She remains on CPAP.  Laboratory evaluation in June 2020, showed that Keppra and carbamazepine were therapeutic.  She has not had recurrent seizure.  She is tolerating medication without side effect.  She continues to live alone, she drives a car without difficulty.  She does admit she has not been walking as much as she should.  She has not had any falls.  She denies any new problems or concerns.  She indicates her overall health has been well.  She presents today for evaluation via virtual visit.   REVIEW OF SYSTEMS: Out of a complete 14 system review of symptoms, the patient complains only of the following symptoms, and all other reviewed systems are negative.  Seizures  ALLERGIES: No Known Allergies  HOME MEDICATIONS: Outpatient Medications Prior to  Visit  Medication Sig Dispense Refill  . aspirin 81 MG chewable tablet Chew 81 mg by mouth daily.    . carvedilol (COREG) 3.125 MG tablet TAKE 1 TABLET(3.125 MG) BY MOUTH TWICE DAILY WITH A MEAL 180 tablet 0  . cholecalciferol (VITAMIN D3) 25 MCG (1000 UT) tablet Take 1,000 Units by mouth daily.    Marland Kitchen escitalopram (LEXAPRO) 10 MG tablet TAKE 1 TABLET(10 MG) BY MOUTH AT BEDTIME 90 tablet 1  . lisinopril-hydrochlorothiazide (ZESTORETIC) 20-25 MG tablet TAKE 1 TABLET BY MOUTH DAILY. 90 tablet 1  . Multiple Vitamins-Minerals (MULTIVITAMIN PO) Take 1 tablet by mouth daily.    . simvastatin (ZOCOR) 20 MG tablet TAKE 1 TABLET(20 MG) BY MOUTH DAILY 90 tablet 0  . carbamazepine (TEGRETOL) 200 MG tablet HALF TABLET BY MOUTH TWICE DAILY FOR 2 WEEKS. THEN ONE TABLET TWICE DAILY 180 tablet 3  . levETIRAcetam (KEPPRA) 750 MG tablet TAKE 2 TABLETS(1500 MG) BY MOUTH TWICE DAILY 360 tablet 1  . esomeprazole (NEXIUM) 40 MG capsule Take 1 capsule (40 mg total) by mouth daily. 30 capsule 3  . sucralfate (CARAFATE) 1 GM/10ML suspension Take 10 mLs (1 g total) by mouth 4 (four) times daily -  with meals and at bedtime. 420 mL 0   No facility-administered medications prior to visit.    PAST MEDICAL HISTORY: Past Medical History:  Diagnosis Date  . Anxiety   . Cataract   . Chronic insomnia 10/13/2016  . Depression   . Gait disorder 03/14/2014  . HTN (hypertension)   .  Hx of cardiovascular stress test    Lexiscan Myoview (1/16):  No ischemia, EF 68%, breast atten; Low Risk  . Hyperlipemia   . Memory difficulty 03/14/2014  . Meningitis due to bacteria 01-18-14  . OSA (obstructive sleep apnea) 11/30/2016  . Seizures (HCC)   . TIA (transient ischemic attack)     PAST SURGICAL HISTORY: Past Surgical History:  Procedure Laterality Date  . NO PAST SURGERIES      FAMILY HISTORY: Family History  Problem Relation Age of Onset  . Heart attack Mother 31  . Stroke Mother   . Hypertension Mother   . Heart  attack Father 84  . Cerebral aneurysm Sister   . Hypertension Sister   . Kidney disease Sister        ESRD; HD in last year  . Hypertension Brother   . Hyperlipidemia Brother   . Rheum arthritis Sister   . Arthritis Sister   . Hypertension Brother   . Hyperlipidemia Brother   . Hyperlipidemia Brother   . Hypertension Brother     SOCIAL HISTORY: Social History   Socioeconomic History  . Marital status: Divorced    Spouse name: Not on file  . Number of children: 1  . Years of education: college  . Highest education level: Some college, no degree  Occupational History  . Occupation: Retired Financial planner: [employer]  Tobacco Use  . Smoking status: Former Smoker    Packs/day: 0.50    Years: 40.00    Pack years: 20.00    Types: Cigarettes    Quit date: 07/09/2011    Years since quitting: 8.2  . Smokeless tobacco: Never Used  Vaping Use  . Vaping Use: Never used  Substance and Sexual Activity  . Alcohol use: Yes    Alcohol/week: 0.0 standard drinks    Comment: Rare  . Drug use: No  . Sexual activity: Never    Birth control/protection: Abstinence  Other Topics Concern  . Not on file  Social History Narrative   Marital status: divorced x 35 years; not dating; not interested in 2019.      Children: 1 child/son (44); 2 grandchildren; [place]      Lives:  Alone in [place] in [place].      Employment: retired 2008; Administrator, arts.      Tobacco: quit smoking in 2013.  Smoked x many years       Alcohol: none; holidays only      Exercise: none; quit exercising two years ago in 2015.        Seatbelt: 100%; no texting. Does not drive in 2130 due to seizure activity.       ADLs: no driving in [id number] due to seizure activity; independent with ADLs.  Has cane for long distances      Advanced Directives: none; [name]/sister.  FULL CODE no prolonged measures.         Patient is single lives at home alone, has 1 child   Patient is right handed    Education level is 1 year of college   Caffeine consumption is 2 cups daily   Social Determinants of Health   Financial Resource Strain:   . Difficulty of Paying Living Expenses:   Food Insecurity:   . Worried About Programme researcher, broadcasting/film/video in the Last Year:   . Barista in the Last Year:   Transportation Needs:   . Freight forwarder (Medical):   Marland Kitchen Lack  of Transportation (Non-Medical):   Physical Activity:   . Days of Exercise per Week:   . Minutes of Exercise per Session:   Stress:   . Feeling of Stress :   Social Connections:   . Frequency of Communication with Friends and Family:   . Frequency of Social Gatherings with Friends and Family:   . Attends Religious Services:   . Active Member of Clubs or Organizations:   . Attends Banker Meetings:   Marland Kitchen Marital Status:   Intimate Partner Violence:   . Fear of Current or Ex-Partner:   . Emotionally Abused:   Marland Kitchen Physically Abused:   . Sexually Abused:    PHYSICAL EXAM  Vitals:   10/23/19 1005  BP: 119/70  Pulse: 62  Weight: 273 lb (123.8 kg)  Height: 5\' 5"  (1.651 m)   Body mass index is 45.43 kg/m.  Generalized: Well developed, in no acute distress   Neurological examination  Mentation: Alert oriented to time, place, history taking. Follows all commands speech and language fluent Cranial nerve II-XII: Pupils were equal round reactive to light. Extraocular movements were full, visual field were full on confrontational test. Facial sensation and strength were normal. Head turning and shoulder shrug  were normal and symmetric. Motor: The motor testing reveals 5 over 5 strength of all 4 extremities. Good symmetric motor tone is noted throughout.  Sensory: Sensory testing is intact to soft touch on all 4 extremities. No evidence of extinction is noted.  Coordination: Cerebellar testing reveals good finger-nose-finger and heel-to-shin bilaterally.  Gait and station: Gait is normal. Tandem gait is slightly  unsteady. Romberg is negative. No drift is seen.  Reflexes: Deep tendon reflexes are symmetric but depressed throughout DIAGNOSTIC DATA (LABS, IMAGING, TESTING) - I reviewed patient records, labs, notes, testing and imaging myself where available.  Lab Results  Component Value Date   WBC 3.1 (L) 08/24/2019   HGB 11.6 08/24/2019   HCT 35.9 08/24/2019   MCV 81 08/24/2019   PLT 252 08/24/2019      Component Value Date/Time   NA 143 08/24/2019 1746   K 3.5 08/24/2019 1746   CL 101 08/24/2019 1746   CO2 26 08/24/2019 1746   GLUCOSE 90 08/24/2019 1746   GLUCOSE 122 (H) 09/25/2016 1834   BUN 16 08/24/2019 1746   CREATININE 0.83 08/24/2019 1746   CREATININE 0.89 10/28/2015 1004   CALCIUM 9.9 08/24/2019 1746   PROT 6.3 08/24/2019 1746   ALBUMIN 4.5 08/24/2019 1746   AST 12 08/24/2019 1746   ALT 8 08/24/2019 1746   ALKPHOS 129 (H) 08/24/2019 1746   BILITOT <0.2 08/24/2019 1746   GFRNONAA 73 08/24/2019 1746   GFRNONAA 79 10/17/2013 1327   GFRAA 84 08/24/2019 1746   GFRAA >89 10/17/2013 1327   Lab Results  Component Value Date   CHOL 210 (H) 05/28/2019   HDL 75 05/28/2019   LDLCALC 118 (H) 05/28/2019   TRIG 94 05/28/2019   CHOLHDL 2.8 05/28/2019   Lab Results  Component Value Date   HGBA1C 6.2 (H) 05/28/2019   No results found for: 05/30/2019 Lab Results  Component Value Date   TSH 2.570 05/26/2018    ASSESSMENT AND PLAN 69 y.o. year old female  has a past medical history of Anxiety, Cataract, Chronic insomnia (10/13/2016), Depression, Gait disorder (03/14/2014), HTN (hypertension), cardiovascular stress test, Hyperlipemia, Memory difficulty (03/14/2014), Meningitis due to bacteria (01-18-14), OSA (obstructive sleep apnea) (11/30/2016), Seizures (HCC), and TIA (transient ischemic attack). here with:  1.  Episodes of transient alteration of awareness, probable seizures -Continues to do well, no recurrent episodes -Continue carbamazepine 200 mg twice daily -Continue Keppra  750 mg tablet, 2 tablets twice a day -Check routine blood work -Call for recurrent seizure, otherwise follow-up in 1 year  2.  OSA on CPAP -Was scheduled to see Dr. Rexene Alberts next week, will cancel that appointment -Reviewed CPAP compliance, download indicates excellent compliance, greater than 4 hours of use 100%, 30/30 days -We will fax the order to [facility] for supplies  -Encouraged to continue excellent CPAP compliance -Follow-up in 1 year or sooner if needed  I spent 30 minutes of face-to-face and non-face-to-face time with patient.  This included previsit chart review, lab review, study review, order entry, electronic health record documentation, patient education.  Butler Denmark, AGNP-C, DNP 10/23/2019, 10:45 AM [facility] [address], [place], [place] [address] [phone]

## 2019-10-23 NOTE — Patient Instructions (Signed)
Continue current medications  Check blood work today  Continue using CPAP every night > 4 hours  See you back in 1 year

## 2019-10-23 NOTE — Telephone Encounter (Signed)
Pt called stating that she came in this morning for her appt then headed to her sister's house and when she was going to leave her sister's house she ended up having a seizure and she would like to discuss this with the provider. Please advise.

## 2019-10-24 ENCOUNTER — Other Ambulatory Visit: Payer: Self-pay | Admitting: Emergency Medicine

## 2019-10-24 ENCOUNTER — Encounter: Payer: Self-pay | Admitting: Neurology

## 2019-10-24 LAB — COMPREHENSIVE METABOLIC PANEL
ALT: 9 IU/L (ref 0–32)
AST: 13 IU/L (ref 0–40)
Albumin/Globulin Ratio: 2.2 (ref 1.2–2.2)
Albumin: 4.4 g/dL (ref 3.8–4.8)
Alkaline Phosphatase: 132 IU/L — ABNORMAL HIGH (ref 48–121)
BUN/Creatinine Ratio: 24 (ref 12–28)
BUN: 21 mg/dL (ref 8–27)
Bilirubin Total: <0.2 mg/dL (ref 0.0–1.2)
CO2: 28 mmol/L (ref 20–29)
Calcium: 9.3 mg/dL (ref 8.7–10.3)
Chloride: 101 mmol/L (ref 96–106)
Creatinine, Ser: 0.88 mg/dL (ref 0.57–1.00)
GFR calc Af Amer: 78 mL/min/{1.73_m2} (ref >59)
GFR calc non Af Amer: 68 mL/min/{1.73_m2} (ref >59)
Globulin, Total: 2 g/dL (ref 1.5–4.5)
Glucose: 90 mg/dL (ref 65–99)
Potassium: 4.2 mmol/L (ref 3.5–5.2)
Sodium: 140 mmol/L (ref 134–144)
Total Protein: 6.4 g/dL (ref 6.0–8.5)

## 2019-10-24 LAB — CBC WITH DIFFERENTIAL/PLATELET
Basophils Absolute: 0 10*3/uL (ref 0.0–0.2)
Basos: 1 %
EOS (ABSOLUTE): 0.1 10*3/uL (ref 0.0–0.4)
Eos: 4 %
Hematocrit: 35.9 % (ref 34.0–46.6)
Hemoglobin: 11.5 g/dL (ref 11.1–15.9)
Immature Grans (Abs): 0 10*3/uL (ref 0.0–0.1)
Immature Granulocytes: 0 %
Lymphocytes Absolute: 1 10*3/uL (ref 0.7–3.1)
Lymphs: 28 %
MCH: 26 pg — ABNORMAL LOW (ref 26.6–33.0)
MCHC: 32 g/dL (ref 31.5–35.7)
MCV: 81 fL (ref 79–97)
Monocytes Absolute: 0.4 10*3/uL (ref 0.1–0.9)
Monocytes: 12 %
Neutrophils Absolute: 1.9 10*3/uL (ref 1.4–7.0)
Neutrophils: 55 %
Platelets: 235 10*3/uL (ref 150–450)
RBC: 4.42 x10E6/uL (ref 3.77–5.28)
RDW: 12.9 % (ref 11.7–15.4)
WBC: 3.5 10*3/uL (ref 3.4–10.8)

## 2019-10-24 LAB — CARBAMAZEPINE LEVEL, TOTAL: Carbamazepine (Tegretol), S: 8 ug/mL (ref 4.0–12.0)

## 2019-10-24 MED ORDER — CARBAMAZEPINE 200 MG PO TABS: ORAL_TABLET | ORAL | 4 refills | Status: DC

## 2019-10-24 NOTE — Telephone Encounter (Signed)
Approved per protocol.  Requested Prescriptions  Pending Prescriptions Disp Refills  . simvastatin (ZOCOR) 20 MG tablet [Pharmacy Med Name: SIMVASTATIN 20MG  TABLETS] 90 tablet 0    Sig: TAKE 1 TABLET(20 MG) BY MOUTH DAILY     Cardiovascular:  Antilipid - Statins Failed - 10/24/2019  2:14 PM      Failed - Total Cholesterol in normal range and within 360 days    Cholesterol, Total  Date Value Ref Range Status  05/28/2019 210 (H) 100 - 199 mg/dL Final         Failed - LDL in normal range and within 360 days    LDL Chol Calc (NIH)  Date Value Ref Range Status  05/28/2019 118 (H) 0 - 99 mg/dL Final         Passed - HDL in normal range and within 360 days    HDL  Date Value Ref Range Status  05/28/2019 75 >39 mg/dL Final         Passed - Triglycerides in normal range and within 360 days    Triglycerides  Date Value Ref Range Status  05/28/2019 94 0 - 149 mg/dL Final         Passed - Patient is not pregnant      Passed - Valid encounter within last 12 months    Recent Outpatient Visits          2 months ago Abdominal pain, epigastric   Primary Care at 05/30/2019, [place], NP   2 months ago Generalized abdominal pain   Primary Care at [facility], [facility], MD   4 months ago Essential hypertension   Primary Care at [facility], [facility], MD   11 months ago Essential hypertension   Primary Care at [place], [place], MD   1 year ago Essential hypertension   Primary Care at [facility], [facility], MD      Future Appointments            In 4 weeks Sagardia, Eilleen Kempf, MD Primary Care at [place], [facility]

## 2019-10-24 NOTE — Telephone Encounter (Signed)
I sent pt a mychart message with lab results, recommendations.

## 2019-10-24 NOTE — Addendum Note (Signed)
Addended by: Glean Salvo on: 10/24/2019 01:40 PM   Modules accepted: Orders

## 2019-10-24 NOTE — Telephone Encounter (Signed)
If the patient believes she had a true seizure, we can go up on the carbamazepine slightly, 200 mg in the morning, 300 mg in the evening.  She is on full dose therapy of Keppra. Make sure she took her medications yesterday.  Carbamazepine level was in therapeutic range at 8.0, labs showed no significant abnormality. Give her follow-up appointment in 6 months so we can recheck the blood level of carbamazepine at that time, review signs of toxicity with her to watch for.

## 2019-10-24 NOTE — Telephone Encounter (Signed)
Spoke to pt.  She stated that she was leaving her sisters home, (looking down at her pocketbook at the front door, stopped, did not respond to her sister when spoken to, lasted less then a minute.  Drove home by her sister,  She did take her medications as normal.  I relayed that she was not to drive for 6 months after seizure.  I would relay to SS/NP.

## 2019-10-31 ENCOUNTER — Ambulatory Visit: Payer: Medicare Other | Admitting: Neurology

## 2019-11-22 ENCOUNTER — Other Ambulatory Visit: Payer: Self-pay

## 2019-11-22 ENCOUNTER — Ambulatory Visit (INDEPENDENT_AMBULATORY_CARE_PROVIDER_SITE_OTHER): Payer: Medicare Other | Admitting: Emergency Medicine

## 2019-11-22 ENCOUNTER — Encounter: Payer: Self-pay | Admitting: Emergency Medicine

## 2019-11-22 VITALS — BP 106/65 | HR 60 | Temp 97.7°F | Ht 65.5 in | Wt 270.8 lb

## 2019-11-22 DIAGNOSIS — R6889 Other general symptoms and signs: Secondary | ICD-10-CM | POA: Diagnosis not present

## 2019-11-22 DIAGNOSIS — D709 Neutropenia, unspecified: Secondary | ICD-10-CM | POA: Diagnosis not present

## 2019-11-22 DIAGNOSIS — G4733 Obstructive sleep apnea (adult) (pediatric): Secondary | ICD-10-CM | POA: Diagnosis not present

## 2019-11-22 DIAGNOSIS — E785 Hyperlipidemia, unspecified: Secondary | ICD-10-CM | POA: Diagnosis not present

## 2019-11-22 DIAGNOSIS — R7303 Prediabetes: Secondary | ICD-10-CM

## 2019-11-22 DIAGNOSIS — G40909 Epilepsy, unspecified, not intractable, without status epilepticus: Secondary | ICD-10-CM | POA: Diagnosis not present

## 2019-11-22 DIAGNOSIS — I10 Essential (primary) hypertension: Secondary | ICD-10-CM

## 2019-11-22 DIAGNOSIS — Z0000 Encounter for general adult medical examination without abnormal findings: Secondary | ICD-10-CM

## 2019-11-22 DIAGNOSIS — Z6841 Body Mass Index (BMI) 40.0 and over, adult: Secondary | ICD-10-CM

## 2019-11-22 DIAGNOSIS — L83 Acanthosis nigricans: Secondary | ICD-10-CM

## 2019-11-22 NOTE — Patient Instructions (Addendum)
   If you have lab work done today you will be contacted with your lab results within the next 2 weeks.  If you have not heard from us then please contact us. The fastest way to get your results is to register for My Chart.   IF you received an x-ray today, you will receive an invoice from [facility]. Please contact [facility] at [phone] with questions or concerns regarding your invoice.   IF you received labwork today, you will receive an invoice from [facility]. Please contact [facility] at [phone] with questions or concerns regarding your invoice.   Our billing staff will not be able to assist you with questions regarding bills from these companies.  You will be contacted with the lab results as soon as they are available. The fastest way to get your results is to activate your My Chart account. Instructions are located on the last page of this paperwork. If you have not heard from us regarding the results in 2 weeks, please contact this office.     Health Maintenance After Age 65 After age 65, you are at a higher risk for certain long-term diseases and infections as well as injuries from falls. Falls are a major cause of broken bones and head injuries in people who are older than age 65. Getting regular preventive care can help to keep you healthy and well. Preventive care includes getting regular testing and making lifestyle changes as recommended by your health care provider. Talk with your health care provider about:  Which screenings and tests you should have. A screening is a test that checks for a disease when you have no symptoms.  A diet and exercise plan that is right for you. What should I know about screenings and tests to prevent falls? Screening and testing are the best ways to find a health problem early. Early diagnosis and treatment give you the best chance of managing medical conditions that are common after age 65. Certain conditions and  lifestyle choices may make you more likely to have a fall. Your health care provider may recommend:  Regular vision checks. Poor vision and conditions such as cataracts can make you more likely to have a fall. If you wear glasses, make sure to get your prescription updated if your vision changes.  Medicine review. Work with your health care provider to regularly review all of the medicines you are taking, including over-the-counter medicines. Ask your health care provider about any side effects that may make you more likely to have a fall. Tell your health care provider if any medicines that you take make you feel dizzy or sleepy.  Osteoporosis screening. Osteoporosis is a condition that causes the bones to get weaker. This can make the bones weak and cause them to break more easily.  Blood pressure screening. Blood pressure changes and medicines to control blood pressure can make you feel dizzy.  Strength and balance checks. Your health care provider may recommend certain tests to check your strength and balance while standing, walking, or changing positions.  Foot health exam. Foot pain and numbness, as well as not wearing proper footwear, can make you more likely to have a fall.  Depression screening. You may be more likely to have a fall if you have a fear of falling, feel emotionally low, or feel unable to do activities that you used to do.  Alcohol use screening. Using too much alcohol can affect your balance and may make you more likely to   have a fall. What actions can I take to lower my risk of falls? General instructions  Talk with your health care provider about your risks for falling. Tell your health care provider if: ? You fall. Be sure to tell your health care provider about all falls, even ones that seem minor. ? You feel dizzy, sleepy, or off-balance.  Take over-the-counter and prescription medicines only as told by your health care provider. These include any  supplements.  Eat a healthy diet and maintain a healthy weight. A healthy diet includes low-fat dairy products, low-fat (lean) meats, and fiber from whole grains, beans, and lots of fruits and vegetables. Home safety  Remove any tripping hazards, such as rugs, cords, and clutter.  Install safety equipment such as grab bars in bathrooms and safety rails on stairs.  Keep rooms and walkways well-lit. Activity   Follow a regular exercise program to stay fit. This will help you maintain your balance. Ask your health care provider what types of exercise are appropriate for you.  If you need a cane or walker, use it as recommended by your health care provider.  Wear supportive shoes that have nonskid soles. Lifestyle  Do not drink alcohol if your health care provider tells you not to drink.  If you drink alcohol, limit how much you have: ? 0-1 drink a day for women. ? 0-2 drinks a day for men.  Be aware of how much alcohol is in your drink. In the [place] one drink equals one typical bottle of beer (12 oz), one-half glass of wine (5 oz), or one shot of hard liquor (1 oz).  Do not use any products that contain nicotine or tobacco, such as cigarettes and e-cigarettes. If you need help quitting, ask your health care provider. Summary  Having a healthy lifestyle and getting preventive care can help to protect your health and wellness after age 65.  Screening and testing are the best way to find a health problem early and help you avoid having a fall. Early diagnosis and treatment give you the best chance for managing medical conditions that are more common for people who are older than age 65.  Falls are a major cause of broken bones and head injuries in people who are older than age 65. Take precautions to prevent a fall at home.  Work with your health care provider to learn what changes you can make to improve your health and wellness and to prevent falls. This information is not intended  to replace advice given to you by your health care provider. Make sure you discuss any questions you have with your health care provider. Document Revised: 08/10/2018 Document Reviewed: 03/02/2017 Elsevier Patient Education  2020 [employer]  

## 2019-11-22 NOTE — Progress Notes (Signed)
[name] [name] 69 y.o.   Chief Complaint  Patient presents with  . Follow-up    x6 mos on chronic medical conditions    HISTORY OF PRESENT ILLNESS: This is a 69 y.o. female here for follow-up of chronic medical conditions. Has history of seizure disorder.  Sees neurologist regularly.  Recently had doses of carbamazepine and Keppra increased. Also recently had episode of upper abdominal pain with dark stools which responded to esomeprazole.  Asymptomatic at present time. Has been on Lexapro 10 mg for several years.  Doing well. History of hypertension on Zestoretic 20-25 mg daily and carvedilol twice a day. History of dyslipidemia on simvastatin 20 mg daily. Has no complaints or medical concerns today. Colonoscopy done in 2017 completely normal.  Follow-up in 10 years. Up-to-date with mammograms.  Normal. Recent blood work results reviewed with patient.  Has chronically low white cell count. Also has history of prediabetes. Fully vaccinated against Covid. Complaining of rash to left axilla for several months.  HPI   Prior to Admission medications   Medication Sig Start Date End Date Taking? Authorizing Provider  aspirin 81 MG chewable tablet Chew 81 mg by mouth daily.   Yes [provider]  carbamazepine (TEGRETOL) 200 MG tablet Take 1 in the morning, take 1.5 in the evening 10/24/19  Yes Glean Salvo, NP  carvedilol (COREG) 3.125 MG tablet TAKE 1 TABLET(3.125 MG) BY MOUTH TWICE DAILY WITH A MEAL 10/19/19  Yes Jaquayla Hege, Eilleen Kempf, MD  cholecalciferol (VITAMIN D3) 25 MCG (1000 UT) tablet Take 1,000 Units by mouth daily.   Yes [provider]  escitalopram (LEXAPRO) 10 MG tablet TAKE 1 TABLET(10 MG) BY MOUTH AT BEDTIME 04/04/19  Yes Leiam Hopwood, Eilleen Kempf, MD  levETIRAcetam (KEPPRA) 750 MG tablet TAKE 2 TABLETS(1500 MG) BY MOUTH TWICE DAILY 10/23/19  Yes Glean Salvo, NP  lisinopril-hydrochlorothiazide (ZESTORETIC) 20-25 MG tablet TAKE 1 TABLET BY MOUTH DAILY.  10/05/19  Yes Jalan Bodi, Eilleen Kempf, MD  Multiple Vitamins-Minerals (MULTIVITAMIN PO) Take 1 tablet by mouth daily.   Yes [provider]  simvastatin (ZOCOR) 20 MG tablet TAKE 1 TABLET(20 MG) BY MOUTH DAILY 10/24/19  Yes Crisol Muecke, Eilleen Kempf, MD    No Known Allergies  Patient Active Problem List   Diagnosis Date Noted  . Seizure disorder (HCC) 05/28/2019  . Prediabetes 05/28/2019  . Staring episodes 11/24/2017  . OSA (obstructive sleep apnea) 11/30/2016  . Chronic insomnia 10/13/2016  . Transient cerebral ischemia 08/26/2016  . Transient memory loss 08/26/2016  . Morbid obesity (HCC) 05/31/2016  . Glucose intolerance (impaired glucose tolerance) 05/18/2016  . 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 Generalized anxiety disorder 03/15/2014  . Memory difficulty 03/14/2014  . Demand myocardial ischemic related infarction 01/23/2014    Class: Diagnosis of  . Depression, reactive 10/12/2012  . Chronic neutropenia (HCC) 04/14/2012  . Hyperlipemia 10/20/2011  . HTN (hypertension) 10/20/2011    Past Medical History:  Diagnosis Date  . Anxiety   . Cataract   . Chronic insomnia 10/13/2016  . Depression   . Gait disorder 03/14/2014  . HTN (hypertension)   . Hx of cardiovascular stress test    Lexiscan Myoview (1/16):  No ischemia, EF 68%, breast atten; Low Risk  . Hyperlipemia   . Memory difficulty 03/14/2014  . Meningitis due to bacteria 01-18-14  . OSA (obstructive sleep apnea) 11/30/2016  . Seizures (HCC)   . TIA (transient ischemic attack)     Past Surgical History:  Procedure Laterality Date  .  NO PAST SURGERIES      Social History   Socioeconomic History  . Marital status: Divorced    Spouse name: Not on file  . Number of children: 1  . Years of education: college  . Highest education level: Some college, no degree  Occupational History  . Occupation: Retired Financial planner: [employer]  Tobacco Use  . Smoking status: Former Smoker    Packs/day:  0.50    Years: 40.00    Pack years: 20.00    Types: Cigarettes    Quit date: 07/09/2011    Years since quitting: 8.3  . Smokeless tobacco: Never Used  Vaping Use  . Vaping Use: Never used  Substance and Sexual Activity  . Alcohol use: Yes    Alcohol/week: 0.0 standard drinks    Comment: Rare  . Drug use: No  . Sexual activity: Never    Birth control/protection: Abstinence  Other Topics Concern  . Not on file  Social History Narrative   Marital status: divorced x 35 years; not dating; not interested in 2019.      Children: 1 child/son (44); 2 grandchildren; [place]      Lives:  Alone in [place] in [place].      Employment: retired 2008; Administrator, arts.      Tobacco: quit smoking in 2013.  Smoked x many years       Alcohol: none; holidays only      Exercise: none; quit exercising two years ago in 2015.        Seatbelt: 100%; no texting. Does not drive in 8315 due to seizure activity.       ADLs: no driving in 1761-6073 due to seizure activity; independent with ADLs.  Has cane for long distances      Advanced Directives: none; [name]/sister.  FULL CODE no prolonged measures.         Patient is single lives at home alone, has 1 child   Patient is right handed   Education level is 1 year of college   Caffeine consumption is 2 cups daily   Social Determinants of Health   Financial Resource Strain:   . Difficulty of Paying Living Expenses:   Food Insecurity:   . Worried About Programme researcher, broadcasting/film/video in the Last Year:   . Barista in the Last Year:   Transportation Needs:   . Freight forwarder (Medical):   Marland Kitchen Lack of Transportation (Non-Medical):   Physical Activity:   . Days of Exercise per Week:   . Minutes of Exercise per Session:   Stress:   . Feeling of Stress :   Social Connections:   . Frequency of Communication with Friends and Family:   . Frequency of Social Gatherings with Friends and Family:   . Attends Religious Services:     . Active Member of Clubs or Organizations:   . Attends Banker Meetings:   Marland Kitchen Marital Status:   Intimate Partner Violence:   . Fear of Current or Ex-Partner:   . Emotionally Abused:   Marland Kitchen Physically Abused:   . Sexually Abused:     Family History  Problem Relation Age of Onset  . Heart attack Mother 51  . Stroke Mother   . Hypertension Mother   . Heart attack Father 7  . Cerebral aneurysm Sister   . Hypertension Sister   . Kidney disease Sister        ESRD; HD in last  year  . Hypertension Brother   . Hyperlipidemia Brother   . Rheum arthritis Sister   . Arthritis Sister   . Hypertension Brother   . Hyperlipidemia Brother   . Hyperlipidemia Brother   . Hypertension Brother      Review of Systems  Constitutional: Negative.  Negative for chills and fever.  HENT: Negative.  Negative for congestion and sore throat.   Respiratory: Negative.  Negative for cough and shortness of breath.   Cardiovascular: Negative.  Negative for chest pain and palpitations.  Gastrointestinal: Negative for abdominal pain, diarrhea, nausea and vomiting.  Genitourinary: Negative.  Negative for dysuria and hematuria.  Musculoskeletal: Negative.  Negative for back pain, myalgias and neck pain.  Skin: Positive for rash.  Neurological: Negative.  Negative for dizziness and headaches.  Endo/Heme/Allergies: Negative.   All other systems reviewed and are negative.    Vitals:   11/22/19 0839  BP: 106/65  Pulse: 60  Temp: 97.7 F (36.5 C)  SpO2: 96%     Physical Exam Vitals reviewed.  Constitutional:      Appearance: Normal appearance. She is obese.  HENT:     Head: Normocephalic.  Eyes:     Extraocular Movements: Extraocular movements intact.     Conjunctiva/sclera: Conjunctivae normal.     Pupils: Pupils are equal, round, and reactive to light.  Cardiovascular:     Rate and Rhythm: Normal rate and regular rhythm.     Pulses: Normal pulses.     Heart sounds: Normal heart  sounds.  Pulmonary:     Effort: Pulmonary effort is normal.     Breath sounds: Normal breath sounds.  Abdominal:     General: Bowel sounds are normal.     Palpations: Abdomen is soft.  Musculoskeletal:     Cervical back: Normal range of motion and neck supple.  Skin:    General: Skin is warm and dry.     Capillary Refill: Capillary refill takes less than 2 seconds.     Findings: Rash (Left axillary area, see picture below) present.  Neurological:     General: No focal deficit present.     Mental Status: She is alert and oriented to person, place, and time.  Psychiatric:        Mood and Affect: Mood normal.        Behavior: Behavior normal.       A total of 30 minutes was spent with the patient, greater than 50% of which was in counseling/coordination of care regarding multiple chronic medical problems, review of all medications, review of most recent office visit note, review of most recent neurologist notes, review of most recent blood work results, review of most recent colonoscopy report, diet and nutrition prognosis and need for follow-up.   ASSESSMENT & PLAN: Clinically stable.  No medical concerns identified during this visit.  Continue taking present medications.  No changes.  Follow-up in 6 months. [place]asha was seen today for follow-up.  Diagnoses and all orders for this visit:  Seizure disorder (HCC)  Morbid obesity (HCC)  Prediabetes -     Hemoglobin A1c  Essential hypertension -     Comprehensive metabolic panel  OSA (obstructive sleep apnea)  Dyslipidemia -     Lipid panel  Chronic neutropenia (HCC) -     CBC with Differential/Platelet  Acanthosis nigricans -     Ambulatory referral to Dermatology  BMI 40.0-44.9, adult [facility])    Patient Instructions       If you have  lab work done today you will be contacted with your lab results within the next 2 weeks.  If you have not heard from [place] then please contact us. The fastest way to get your results  is to register for My Chart.   IF you received an x-ray today, you will receive an invoice from [facility]. Please contact [facility] at [phone] with questions or concerns regarding your invoice.   IF you received labwork today, you will receive an invoice from [facility]. Please contact [facility] at [phone] with questions or concerns regarding your invoice.   Our billing staff will not be able to assist you with questions regarding bills from these companies.  You will be contacted with the lab results as soon as they are available. The fastest way to get your results is to activate your My Chart account. Instructions are located on the last page of this paperwork. If you have not heard from [place] regarding the results in 2 weeks, please contact this office.     Health Maintenance After Age 44 After age 67, you are at a higher risk for certain long-term diseases and infections as well as injuries from falls. Falls are a major cause of broken bones and head injuries in people who are older than age 55. Getting regular preventive care can help to keep you healthy and well. Preventive care includes getting regular testing and making lifestyle changes as recommended by your health care provider. Talk with your health care provider about:  Which screenings and tests you should have. A screening is a test that checks for a disease when you have no symptoms.  A diet and exercise plan that is right for you. What should I know about screenings and tests to prevent falls? Screening and testing are the best ways to find a health problem early. Early diagnosis and treatment give you the best chance of managing medical conditions that are common after age 3. Certain conditions and lifestyle choices may make you more likely to have a fall. Your health care provider may recommend:  Regular vision checks. Poor vision and conditions such as cataracts can make you more likely to have a  fall. If you wear glasses, make sure to get your prescription updated if your vision changes.  Medicine review. Work with your health care provider to regularly review all of the medicines you are taking, including over-the-counter medicines. Ask your health care provider about any side effects that may make you more likely to have a fall. Tell your health care provider if any medicines that you take make you feel dizzy or sleepy.  Osteoporosis screening. Osteoporosis is a condition that causes the bones to get weaker. This can make the bones weak and cause them to break more easily.  Blood pressure screening. Blood pressure changes and medicines to control blood pressure can make you feel dizzy.  Strength and balance checks. Your health care provider may recommend certain tests to check your strength and balance while standing, walking, or changing positions.  Foot health exam. Foot pain and numbness, as well as not wearing proper footwear, can make you more likely to have a fall.  Depression screening. You may be more likely to have a fall if you have a fear of falling, feel emotionally low, or feel unable to do activities that you used to do.  Alcohol use screening. Using too much alcohol can affect your balance and may make you more likely to have a fall. What actions  can I take to lower my risk of falls? General instructions  Talk with your health care provider about your risks for falling. Tell your health care provider if: ? You fall. Be sure to tell your health care provider about all falls, even ones that seem minor. ? You feel dizzy, sleepy, or off-balance.  Take over-the-counter and prescription medicines only as told by your health care provider. These include any supplements.  Eat a healthy diet and maintain a healthy weight. A healthy diet includes low-fat dairy products, low-fat (lean) meats, and fiber from whole grains, beans, and lots of fruits and vegetables. Home  safety  Remove any tripping hazards, such as rugs, cords, and clutter.  Install safety equipment such as grab bars in bathrooms and safety rails on stairs.  Keep rooms and walkways well-lit. Activity   Follow a regular exercise program to stay fit. This will help you maintain your balance. Ask your health care provider what types of exercise are appropriate for you.  If you need a cane or walker, use it as recommended by your health care provider.  Wear supportive shoes that have nonskid soles. Lifestyle  Do not drink alcohol if your health care provider tells you not to drink.  If you drink alcohol, limit how much you have: ? 0-1 drink a day for women. ? 0-2 drinks a day for men.  Be aware of how much alcohol is in your drink. In the [place] one drink equals one typical bottle of beer (12 oz), one-half glass of wine (5 oz), or one shot of hard liquor (1 oz).  Do not use any products that contain nicotine or tobacco, such as cigarettes and e-cigarettes. If you need help quitting, ask your health care provider. Summary  Having a healthy lifestyle and getting preventive care can help to protect your health and wellness after age 31.  Screening and testing are the best way to find a health problem early and help you avoid having a fall. Early diagnosis and treatment give you the best chance for managing medical conditions that are more common for people who are older than age 34.  Falls are a major cause of broken bones and head injuries in people who are older than age 63. Take precautions to prevent a fall at home.  Work with your health care provider to learn what changes you can make to improve your health and wellness and to prevent falls. This information is not intended to replace advice given to you by your health care provider. Make sure you discuss any questions you have with your health care provider. Document Revised: 08/10/2018 Document Reviewed: 03/02/2017 Elsevier  Patient Education  2020 Elsevier Inc.      Edwina Barth, MD [facility]

## 2019-11-23 ENCOUNTER — Encounter: Payer: Self-pay | Admitting: Emergency Medicine

## 2019-11-23 LAB — CBC WITH DIFFERENTIAL/PLATELET
Basophils Absolute: 0 10*3/uL (ref 0.0–0.2)
Basos: 0 %
EOS (ABSOLUTE): 0.1 10*3/uL (ref 0.0–0.4)
Eos: 4 %
Hematocrit: 34.5 % (ref 34.0–46.6)
Hemoglobin: 11.2 g/dL (ref 11.1–15.9)
Immature Grans (Abs): 0 10*3/uL (ref 0.0–0.1)
Immature Granulocytes: 0 %
Lymphocytes Absolute: 0.7 10*3/uL (ref 0.7–3.1)
Lymphs: 25 %
MCH: 25.9 pg — ABNORMAL LOW (ref 26.6–33.0)
MCHC: 32.5 g/dL (ref 31.5–35.7)
MCV: 80 fL (ref 79–97)
Monocytes Absolute: 0.4 10*3/uL (ref 0.1–0.9)
Monocytes: 13 %
Neutrophils Absolute: 1.7 10*3/uL (ref 1.4–7.0)
Neutrophils: 58 %
Platelets: 217 10*3/uL (ref 150–450)
RBC: 4.32 x10E6/uL (ref 3.77–5.28)
RDW: 12.9 % (ref 11.7–15.4)
WBC: 2.9 10*3/uL — ABNORMAL LOW (ref 3.4–10.8)

## 2019-11-23 LAB — COMPREHENSIVE METABOLIC PANEL
ALT: 12 IU/L (ref 0–32)
AST: 15 IU/L (ref 0–40)
Albumin/Globulin Ratio: 1.9 (ref 1.2–2.2)
Albumin: 4.4 g/dL (ref 3.8–4.8)
Alkaline Phosphatase: 128 IU/L — ABNORMAL HIGH (ref 48–121)
BUN/Creatinine Ratio: 22 (ref 12–28)
BUN: 21 mg/dL (ref 8–27)
Bilirubin Total: 0.2 mg/dL (ref 0.0–1.2)
CO2: 28 mmol/L (ref 20–29)
Calcium: 9.3 mg/dL (ref 8.7–10.3)
Chloride: 98 mmol/L (ref 96–106)
Creatinine, Ser: 0.97 mg/dL (ref 0.57–1.00)
GFR calc Af Amer: 69 mL/min/{1.73_m2} (ref >59)
GFR calc non Af Amer: 60 mL/min/{1.73_m2} (ref >59)
Globulin, Total: 2.3 g/dL (ref 1.5–4.5)
Glucose: 101 mg/dL — ABNORMAL HIGH (ref 65–99)
Potassium: 3.6 mmol/L (ref 3.5–5.2)
Sodium: 141 mmol/L (ref 134–144)
Total Protein: 6.7 g/dL (ref 6.0–8.5)

## 2019-11-23 LAB — HEMOGLOBIN A1C
Est. average glucose Bld gHb Est-mCnc: 128 mg/dL
Hgb A1c MFr Bld: 6.1 % — ABNORMAL HIGH (ref 4.8–5.6)

## 2019-11-23 LAB — LIPID PANEL
Chol/HDL Ratio: 3.1 ratio (ref 0.0–4.4)
Cholesterol, Total: 222 mg/dL — ABNORMAL HIGH (ref 100–199)
HDL: 72 mg/dL (ref >39)
LDL Chol Calc (NIH): 133 mg/dL — ABNORMAL HIGH (ref 0–99)
Triglycerides: 98 mg/dL (ref 0–149)
VLDL Cholesterol Cal: 17 mg/dL (ref 5–40)

## 2019-11-24 ENCOUNTER — Other Ambulatory Visit: Payer: Self-pay | Admitting: Emergency Medicine

## 2019-11-24 DIAGNOSIS — E785 Hyperlipidemia, unspecified: Secondary | ICD-10-CM

## 2019-11-24 MED ORDER — ROSUVASTATIN CALCIUM 20 MG PO TABS: 20.0000 mg | ORAL_TABLET | Freq: Every day | ORAL | 3 refills | Status: DC

## 2019-11-30 DIAGNOSIS — G4733 Obstructive sleep apnea (adult) (pediatric): Secondary | ICD-10-CM | POA: Diagnosis not present

## 2019-12-06 ENCOUNTER — Other Ambulatory Visit: Payer: Self-pay | Admitting: Emergency Medicine

## 2019-12-06 ENCOUNTER — Other Ambulatory Visit: Payer: Self-pay

## 2019-12-06 ENCOUNTER — Telehealth: Payer: Self-pay | Admitting: Neurology

## 2019-12-06 MED ORDER — ESCITALOPRAM OXALATE 10 MG PO TABS: ORAL_TABLET | ORAL | 1 refills | Status: DC

## 2019-12-06 NOTE — Telephone Encounter (Signed)
I called pharmacy they have a prescription available from 10-22-19 and with 3 refills.  I relayed that will get ready for her.  She appreciated call.

## 2019-12-06 NOTE — Telephone Encounter (Signed)
Pt request refill  escitalopram (LEXAPRO) 10 MG tablet  90 day  [id number] - [place], [place] - [address] AT [facility] [address] Phone:  [phone]  Fax:  [phone]

## 2019-12-06 NOTE — Telephone Encounter (Signed)
Pt request refill levETIRAcetam (KEPPRA) 750 MG tablet at [facility] [phone]

## 2019-12-12 DIAGNOSIS — L819 Disorder of pigmentation, unspecified: Secondary | ICD-10-CM | POA: Diagnosis not present

## 2019-12-12 DIAGNOSIS — L309 Dermatitis, unspecified: Secondary | ICD-10-CM | POA: Diagnosis not present

## 2019-12-23 ENCOUNTER — Other Ambulatory Visit: Payer: Self-pay | Admitting: Emergency Medicine

## 2019-12-31 ENCOUNTER — Other Ambulatory Visit: Payer: Self-pay | Admitting: Emergency Medicine

## 2019-12-31 NOTE — Telephone Encounter (Signed)
Call to pharmacy- they did not receive Rx- resent Rx to pharmacy

## 2020-01-12 ENCOUNTER — Other Ambulatory Visit: Payer: Self-pay | Admitting: Emergency Medicine

## 2020-01-12 DIAGNOSIS — I10 Essential (primary) hypertension: Secondary | ICD-10-CM

## 2020-01-12 NOTE — Telephone Encounter (Signed)
Requested Prescriptions  Pending Prescriptions Disp Refills  . carvedilol (COREG) 3.125 MG tablet [Pharmacy Med Name: CARVEDILOL 3.125MG  TABLETS] 180 tablet 1    Sig: TAKE 1 TABLET(3.125 MG) BY MOUTH TWICE DAILY WITH A MEAL     Cardiovascular:  Beta Blockers Passed - 01/12/2020  9:57 AM      Passed - Last BP in normal range    BP Readings from Last 1 Encounters:  11/22/19 106/65         Passed - Last Heart Rate in normal range    Pulse Readings from Last 1 Encounters:  11/22/19 60         Passed - Valid encounter within last 6 months    Recent Outpatient Visits          1 month ago Seizure disorder [facility])   [facility] at [place], Eilleen Kempf, MD   4 months ago Abdominal pain, epigastric   [facility] at [facility], Gerlene Burdock, NP   4 months ago Generalized abdominal pain   [facility] Care at [facility], Eilleen Kempf, MD   7 months ago Essential hypertension   [facility] at [place], Eilleen Kempf, MD   1 year ago Essential hypertension   Primary Care at [facility], Eilleen Kempf, MD      Future Appointments            In 4 months Sagardia, Eilleen Kempf, MD Primary Care at [place], [facility]

## 2020-01-15 ENCOUNTER — Telehealth: Payer: Self-pay | Admitting: Neurology

## 2020-01-15 NOTE — Telephone Encounter (Signed)
Pt has called to report she is having difficulty in getting her carbamazepine (TEGRETOL) 200 MG tablet from the pharmacy.  Pt will soon run out and is concerned, please call.

## 2020-01-15 NOTE — Telephone Encounter (Signed)
I called pharmacy and they had the  Carbamazepine 10/2019 prescription on file.  They will get ready for pt.  She has not had this medication last night or this am,  She is taking keppra.  I relayed that not taking seizure medication puts her at risk for having a seizure.  Any way to pick up today vs tomorrow and she stated she does not drive and is depending on someone else to drive her.  They deliver by fedex but would take 2-3 days.  She will pick up tomorrow.  I would ask pharmacy what is the earliest she can pick up her refills.  She stated she will do.

## 2020-02-18 DIAGNOSIS — G4733 Obstructive sleep apnea (adult) (pediatric): Secondary | ICD-10-CM | POA: Diagnosis not present

## 2020-02-21 ENCOUNTER — Encounter: Payer: Medicare Other | Admitting: Emergency Medicine

## 2020-03-18 DIAGNOSIS — G4733 Obstructive sleep apnea (adult) (pediatric): Secondary | ICD-10-CM | POA: Diagnosis not present

## 2020-03-26 ENCOUNTER — Other Ambulatory Visit: Payer: Self-pay | Admitting: Emergency Medicine

## 2020-04-14 ENCOUNTER — Ambulatory Visit: Payer: Medicare Other | Admitting: Neurology

## 2020-04-14 ENCOUNTER — Encounter: Payer: Self-pay | Admitting: Neurology

## 2020-04-14 VITALS — BP 128/82 | Ht 65.5 in | Wt 272.2 lb

## 2020-04-14 DIAGNOSIS — G40909 Epilepsy, unspecified, not intractable, without status epilepticus: Secondary | ICD-10-CM | POA: Diagnosis not present

## 2020-04-14 NOTE — Patient Instructions (Signed)
Check EEG  Check blood work today  Continue current medications for now No driving until seizure free 6 months  See you back 6 months, call for seizure activity

## 2020-04-14 NOTE — Progress Notes (Signed)
I have read the note, and I agree with the clinical assessment and plan.  Doniqua Saxby K  [place]   

## 2020-04-14 NOTE — Progress Notes (Signed)
PATIENT: [name] [name] DOB: [birth year]  REASON FOR VISIT: follow up HISTORY FROM: patient  HISTORY OF PRESENT ILLNESS: Today 04/14/20 [name] [name] is a 69 year old female with history of episodes of staring off, likely representing seizures.  She is on carbamazepine and Keppra.  She is on CPAP.  Seizure occurred 10/23/2019, carbamazepine increased 200/300 mg daily. Was at her sister's house, she froze, when to reach for purse, then didn't respond, 30 seconds to 1 minute. After, didn't remember was confused, she remembers looking at her purse, she remembers hearing her sister. "Tell me again what happened". Her sister thinks she had another on the phone a few months ago, she says she doesn't remember, "I blocked her out, intentionally". On November 2, she was eating, kept chewing, just didn't respond, sister says "her chew was different". Before felt dizzy, she checked her BP it was okay. Is on CPAP, doing well.  Lives alone, is compliant with medications.  Presents today for evaluation accompanied by her sister.  HISTORY 10/23/2019 SS: [name] [name] is a 69 year old female with history of episodes of staring off, likely representing seizures.  She remains on carbamazepine and Keppra.  Her last seizure occurred in May 2019.  She is on CPAP.  Her CPAP download indicates excellent compliance, greater than 4 hours of use 100%, 30/30 days, average usage was 7 hours and 51 minutes.  Set pressure is 13 cmH2O.  Leak in the 95th percentile is 1.7, AHI is 1.2.  She indicates her equipment is working properly, denies significant daytime drowsiness.  ESS was 10.  She does have fatigue felt to be related to being overweight.  She has restarted working on dieting.  Denies any new problems or concerns.  Lives alone, drives a car without difficulty.  Presents today for evaluation unaccompanied.   REVIEW OF SYSTEMS: Out of a complete 14 system review of symptoms, the patient complains only of the following  symptoms, and all other reviewed systems are negative.  Seizures  ALLERGIES: No Known Allergies  HOME MEDICATIONS: Outpatient Medications Prior to Visit  Medication Sig Dispense Refill   aspirin 81 MG chewable tablet Chew 81 mg by mouth daily.     carbamazepine (TEGRETOL) 200 MG tablet Take 1 in the morning, take 1.5 in the evening 220 tablet 4   carvedilol (COREG) 3.125 MG tablet TAKE 1 TABLET(3.125 MG) BY MOUTH TWICE DAILY WITH A MEAL 180 tablet 1   cholecalciferol (VITAMIN D3) 25 MCG (1000 UT) tablet Take 1,000 Units by mouth daily.     escitalopram (LEXAPRO) 10 MG tablet TAKE 1 TABLET(10 MG) BY MOUTH AT BEDTIME 90 tablet 1   levETIRAcetam (KEPPRA) 750 MG tablet TAKE 2 TABLETS(1500 MG) BY MOUTH TWICE DAILY 360 tablet 4   lisinopril-hydrochlorothiazide (ZESTORETIC) 20-25 MG tablet TAKE 1 TABLET BY MOUTH DAILY. 90 tablet 0   Multiple Vitamins-Minerals (MULTIVITAMIN PO) Take 1 tablet by mouth daily.     rosuvastatin (CRESTOR) 20 MG tablet Take 1 tablet (20 mg total) by mouth daily. 90 tablet 3   No facility-administered medications prior to visit.    PAST MEDICAL HISTORY: Past Medical History:  Diagnosis Date   Anxiety    Cataract    Chronic insomnia 10/13/2016   Depression    Gait disorder 03/14/2014   HTN (hypertension)    Hx of cardiovascular stress test    Lexiscan Myoview (1/16):  No ischemia, EF 68%, breast atten; Low Risk   Hyperlipemia    Memory difficulty 03/14/2014   Meningitis  due to bacteria 01-18-14   OSA (obstructive sleep apnea) 11/30/2016   Seizures (HCC)    TIA (transient ischemic attack)     PAST SURGICAL HISTORY: Past Surgical History:  Procedure Laterality Date   NO PAST SURGERIES      FAMILY HISTORY: Family History  Problem Relation Age of Onset   Heart attack Mother 33   Stroke Mother    Hypertension Mother    Heart attack Father 43   Cerebral aneurysm Sister    Hypertension Sister    Kidney disease Sister         ESRD; HD in last year   Hypertension Brother    Hyperlipidemia Brother    Rheum arthritis Sister    Arthritis Sister    Hypertension Brother    Hyperlipidemia Brother    Hyperlipidemia Brother    Hypertension Brother     SOCIAL HISTORY: Social History   Socioeconomic History   Marital status: Divorced    Spouse name: Not on file   Number of children: 1   Years of education: college   Highest education level: Some college, no degree  Occupational History   Occupation: Retired Financial planner: [employer]  Tobacco Use   Smoking status: Former Smoker    Packs/day: 0.50    Years: 40.00    Pack years: 20.00    Types: Cigarettes    Quit date: 07/09/2011    Years since quitting: 8.7   Smokeless tobacco: Never Used  Vaping Use   Vaping Use: Never used  Substance and Sexual Activity   Alcohol use: Yes    Alcohol/week: 0.0 standard drinks    Comment: Rare   Drug use: No   Sexual activity: Never    Birth control/protection: Abstinence  Other Topics Concern   Not on file  Social History Narrative   Marital status: divorced x 35 years; not dating; not interested in 2019.      Children: 1 child/son (44); 2 grandchildren; [place]      Lives:  Alone in [place] in [place].      Employment: retired 2008; Administrator, arts.      Tobacco: quit smoking in 2013.  Smoked x many years       Alcohol: none; holidays only      Exercise: none; quit exercising two years ago in 2015.        Seatbelt: 100%; no texting. Does not drive in 1610 due to seizure activity.       ADLs: no driving in 9604-5409 due to seizure activity; independent with ADLs.  Has cane for long distances      Advanced Directives: none; [name]/sister.  FULL CODE no prolonged measures.         Patient is single lives at home alone, has 1 child   Patient is right handed   Education level is 1 year of college   Caffeine consumption is 2 cups daily   Social Determinants of Health    Financial Resource Strain: Not on file  Food Insecurity: Not on file  Transportation Needs: Not on file  Physical Activity: Not on file  Stress: Not on file  Social Connections: Not on file  Intimate Partner Violence: Not on file   PHYSICAL EXAM  Vitals:   04/14/20 1511  BP: 128/82  Weight: 272 lb 3.2 oz (123.5 kg)  Height: 5' 5.5" (1.664 m)   Body mass index is 44.61 kg/m.  Generalized: Well developed, in no acute distress  Neurological examination  Mentation: Alert oriented to time, place, history taking. Follows all commands speech and language fluent Cranial nerve II-XII: Pupils were equal round reactive to light. Extraocular movements were full, visual field were full on confrontational test. Facial sensation and strength were normal. Head turning and shoulder shrug  were normal and symmetric. Motor: The motor testing reveals 5 over 5 strength of all 4 extremities. Good symmetric motor tone is noted throughout.  Sensory: Sensory testing is intact to soft touch on all 4 extremities. No evidence of extinction is noted.  Coordination: Cerebellar testing reveals good finger-nose-finger and heel-to-shin bilaterally.  Gait and station: Gait is normal. Tandem gait is unsteady. Romberg is negative. No drift is seen.  Reflexes: Deep tendon reflexes are symmetric but decreased throughout  DIAGNOSTIC DATA (LABS, IMAGING, TESTING) - I reviewed patient records, labs, notes, testing and imaging myself where available.  Lab Results  Component Value Date   WBC 2.9 (L) 11/22/2019   HGB 11.2 11/22/2019   HCT 34.5 11/22/2019   MCV 80 11/22/2019   PLT 217 11/22/2019      Component Value Date/Time   NA 141 11/22/2019 0934   K 3.6 11/22/2019 0934   CL 98 11/22/2019 0934   CO2 28 11/22/2019 0934   GLUCOSE 101 (H) 11/22/2019 0934   GLUCOSE 122 (H) 09/25/2016 1834   BUN 21 11/22/2019 0934   CREATININE 0.97 11/22/2019 0934   CREATININE 0.89 10/28/2015 1004   CALCIUM 9.3 11/22/2019  0934   PROT 6.7 11/22/2019 0934   ALBUMIN 4.4 11/22/2019 0934   AST 15 11/22/2019 0934   ALT 12 11/22/2019 0934   ALKPHOS 128 (H) 11/22/2019 0934   BILITOT 0.2 11/22/2019 0934   GFRNONAA 60 11/22/2019 0934   GFRNONAA 79 10/17/2013 1327   GFRAA 69 11/22/2019 0934   GFRAA >89 10/17/2013 1327   Lab Results  Component Value Date   CHOL 222 (H) 11/22/2019   HDL 72 11/22/2019   LDLCALC 133 (H) 11/22/2019   TRIG 98 11/22/2019   CHOLHDL 3.1 11/22/2019   Lab Results  Component Value Date   HGBA1C 6.1 (H) 11/22/2019   No results found for: VITAMINB12 Lab Results  Component Value Date   TSH 2.570 05/26/2018    ASSESSMENT AND PLAN 69 y.o. year old female  has a past medical history of Anxiety, Cataract, Chronic insomnia (10/13/2016), Depression, Gait disorder (03/14/2014), HTN (hypertension), cardiovascular stress test, Hyperlipemia, Memory difficulty (03/14/2014), Meningitis due to bacteria (01-18-14), OSA (obstructive sleep apnea) (11/30/2016), Seizures (HCC), and TIA (transient ischemic attack). here with:  1.  Episodes of transient alteration of awareness, probable seizures -Several episodes reported in the last 6 months, most recent was in early November (staring spells) -Will check routine labs, including Keppra and carbamazepine levels, to see if there is room for increase -Check EEG, in 2018 was normal, went for about 1.5 years without seizures, nothing to explain recent recurrence, all reported by her sister  -Continue carbamazepine, Keppra for now, may adjust dosing if tolerated with labs -No driving until seizure-free for 6 months -Call for seizure activity, follow-up in 6 months or sooner if needed  I spent 30 minutes of face-to-face and non-face-to-face time with patient.  This included previsit chart review, lab review, study review, order entry, electronic health record documentation, patient education.  Margie Ege, AGNP-C, DNP 04/14/2020, 4:33 PM [facility] [address], [address] [place], [place] [address] [phone]

## 2020-04-16 ENCOUNTER — Telehealth: Payer: Self-pay | Admitting: Neurology

## 2020-04-16 MED ORDER — CARBAMAZEPINE 200 MG PO TABS: ORAL_TABLET | ORAL | 4 refills | Status: DC

## 2020-04-16 NOTE — Telephone Encounter (Signed)
WBC shows mildly low WBC 3.3, carbamazepine level 8.6, CMP shows mildly elevated alkaline phosphatase 149.  Based on report of recurrent seizures, did discuss with Dr. Anne Hahn, will go ahead and recommend increase carbamazepine 300 mg twice a day.  She is on max dose Keppra.  Keppra level is pending.  We are repeating EEG. New dosing script sent in.

## 2020-04-17 MED ORDER — CARBAMAZEPINE 200 MG PO TABS: ORAL_TABLET | ORAL | 4 refills | Status: DC

## 2020-04-17 NOTE — Addendum Note (Signed)
Addended by: Guy Begin on: 04/17/2020 11:28 AM   Modules accepted: Orders

## 2020-04-17 NOTE — Telephone Encounter (Signed)
Spoke to pt relayed results of labs to her.  Instructed on POC.  Prescription redone to [facility] per her request, cheaper.  EEG scheduled 05-06-20, will call for keppra level which is pending right now.  I will forward results to pcp as well.

## 2020-04-18 LAB — CBC WITH DIFFERENTIAL/PLATELET
Basophils Absolute: 0 10*3/uL (ref 0.0–0.2)
Basos: 1 %
EOS (ABSOLUTE): 0.2 10*3/uL (ref 0.0–0.4)
Eos: 5 %
Hematocrit: 35 % (ref 34.0–46.6)
Hemoglobin: 11.4 g/dL (ref 11.1–15.9)
Immature Grans (Abs): 0 10*3/uL (ref 0.0–0.1)
Immature Granulocytes: 0 %
Lymphocytes Absolute: 1 10*3/uL (ref 0.7–3.1)
Lymphs: 30 %
MCH: 26 pg — ABNORMAL LOW (ref 26.6–33.0)
MCHC: 32.6 g/dL (ref 31.5–35.7)
MCV: 80 fL (ref 79–97)
Monocytes Absolute: 0.4 10*3/uL (ref 0.1–0.9)
Monocytes: 12 %
Neutrophils Absolute: 1.8 10*3/uL (ref 1.4–7.0)
Neutrophils: 52 %
Platelets: 237 10*3/uL (ref 150–450)
RBC: 4.39 x10E6/uL (ref 3.77–5.28)
RDW: 12.9 % (ref 11.7–15.4)
WBC: 3.3 10*3/uL — ABNORMAL LOW (ref 3.4–10.8)

## 2020-04-18 LAB — LEVETIRACETAM LEVEL: Levetiracetam Lvl: 41 ug/mL — ABNORMAL HIGH (ref 10.0–40.0)

## 2020-04-18 LAB — COMPREHENSIVE METABOLIC PANEL
ALT: 13 IU/L (ref 0–32)
AST: 13 IU/L (ref 0–40)
Albumin/Globulin Ratio: 2.3 — ABNORMAL HIGH (ref 1.2–2.2)
Albumin: 4.5 g/dL (ref 3.8–4.8)
Alkaline Phosphatase: 149 IU/L — ABNORMAL HIGH (ref 44–121)
BUN/Creatinine Ratio: 18 (ref 12–28)
BUN: 15 mg/dL (ref 8–27)
Bilirubin Total: 0.2 mg/dL (ref 0.0–1.2)
CO2: 29 mmol/L (ref 20–29)
Calcium: 9.7 mg/dL (ref 8.7–10.3)
Chloride: 100 mmol/L (ref 96–106)
Creatinine, Ser: 0.85 mg/dL (ref 0.57–1.00)
GFR calc Af Amer: 81 mL/min/{1.73_m2} (ref >59)
GFR calc non Af Amer: 70 mL/min/{1.73_m2} (ref >59)
Globulin, Total: 2 g/dL (ref 1.5–4.5)
Glucose: 96 mg/dL (ref 65–99)
Potassium: 4.1 mmol/L (ref 3.5–5.2)
Sodium: 143 mmol/L (ref 134–144)
Total Protein: 6.5 g/dL (ref 6.0–8.5)

## 2020-04-18 LAB — CARBAMAZEPINE LEVEL, TOTAL: Carbamazepine (Tegretol), S: 8.6 ug/mL (ref 4.0–12.0)

## 2020-04-24 ENCOUNTER — Ambulatory Visit: Payer: Self-pay | Admitting: Neurology

## 2020-04-28 ENCOUNTER — Telehealth: Payer: Self-pay

## 2020-04-28 NOTE — Telephone Encounter (Signed)
-----  Message from Suzzanne Cloud, NP sent at 04/28/2020  7:55 AM EST ----- Labs show mildly low WBC 3.3, random Keppra level mildly high, we have increased Carbamazepine due to break through seizure, level shows we had room for this, mildly elevated alk phos, will follow overtime.

## 2020-04-28 NOTE — Telephone Encounter (Signed)
FYI: Pt called returning phone call. Informed Pt nurse left a voicemail. Pt will call back if have any questions or concerns after listening to the voicemail.

## 2020-04-28 NOTE — Telephone Encounter (Signed)
Attempted to call pt, LVM for labresults per DPR. Ask pt to call back for questions or concerns. 

## 2020-05-06 ENCOUNTER — Ambulatory Visit: Payer: Medicare Other | Admitting: Neurology

## 2020-05-06 DIAGNOSIS — R6889 Other general symptoms and signs: Secondary | ICD-10-CM | POA: Diagnosis not present

## 2020-05-06 DIAGNOSIS — G40909 Epilepsy, unspecified, not intractable, without status epilepticus: Secondary | ICD-10-CM | POA: Diagnosis not present

## 2020-05-12 ENCOUNTER — Telehealth: Payer: Self-pay | Admitting: Neurology

## 2020-05-12 NOTE — Telephone Encounter (Signed)
I called the patient, I don't see the EEG result. I am not sure what she is seeing on my chart. Will update patient once results are available.

## 2020-05-12 NOTE — Telephone Encounter (Signed)
Pt called wanting to know if her EEG results have come in. Please advise. 

## 2020-05-21 ENCOUNTER — Encounter: Payer: Self-pay | Admitting: Neurology

## 2020-05-21 NOTE — Telephone Encounter (Signed)
Pt called, could nurse call me with my EEG results.

## 2020-05-21 NOTE — Procedures (Signed)
    History:  [name] [name] is a 70 year old patient with a history of a seizure disorder who has been under good control until recently.  She has had a recent seizure-like episode and is being reevaluated for the seizures.  This is a routine EEG.  No skull defects are noted.  Medications include aspirin, carbamazepine, vitamin D, Coreg, Keppra Lexapro, Zestoretic, multivitamins, and Crestor.  EEG classification: Normal awake  Description of the recording: The background rhythms of this recording consists of a fairly well modulated medium amplitude alpha rhythm of 9 Hz that is reactive to eye opening and closure. As the record progresses, the patient appears to remain in the waking state throughout the recording. Photic stimulation was performed, resulting in a bilateral and symmetric photic driving response. Hyperventilation was also performed, resulting in a minimal buildup of the background rhythm activities without significant slowing seen. At no time during the recording does there appear to be evidence of spike or spike wave discharges or evidence of focal slowing. EKG monitor shows no evidence of cardiac rhythm abnormalities with a heart rate of 54.  Impression: This is a normal EEG recording in the waking state. No evidence of ictal or interictal discharges are seen.

## 2020-05-22 NOTE — Telephone Encounter (Signed)
Mychart message sent.

## 2020-05-22 NOTE — Telephone Encounter (Signed)
-----   Message from Glean Salvo, NP sent at 05/21/2020  5:04 PM EST ----- Jung, Your EEG was normal. Continue with current medications.   Take Care, Sarah   Impression: This is a normal EEG recording in the waking state. No evidence of ictal or interictal discharges are seen.

## 2020-05-27 ENCOUNTER — Encounter: Payer: Medicare Other | Admitting: Emergency Medicine

## 2020-06-05 DIAGNOSIS — R6889 Other general symptoms and signs: Secondary | ICD-10-CM | POA: Diagnosis not present

## 2020-06-19 DIAGNOSIS — G4733 Obstructive sleep apnea (adult) (pediatric): Secondary | ICD-10-CM | POA: Diagnosis not present

## 2020-06-22 ENCOUNTER — Other Ambulatory Visit: Payer: Self-pay | Admitting: Emergency Medicine

## 2020-06-22 NOTE — Telephone Encounter (Signed)
Courtesy refill. Pt has appt 07/10/20

## 2020-07-06 ENCOUNTER — Other Ambulatory Visit: Payer: Self-pay | Admitting: Emergency Medicine

## 2020-07-06 DIAGNOSIS — I10 Essential (primary) hypertension: Secondary | ICD-10-CM

## 2020-07-06 NOTE — Telephone Encounter (Signed)
Requested Prescriptions  Pending Prescriptions Disp Refills  . carvedilol (COREG) 3.125 MG tablet [Pharmacy Med Name: CARVEDILOL 3.125MG  TABLETS] 180 tablet 0    Sig: TAKE 1 TABLET(3.125 MG) BY MOUTH TWICE DAILY WITH A MEAL     Cardiovascular:  Beta Blockers Failed - 07/06/2020  3:55 PM      Failed - Valid encounter within last 6 months    Recent Outpatient Visits          7 months ago Seizure disorder [facility])   [facility] at [facility] Eilleen Kempf, MD   10 months ago Abdominal pain, epigastric   [facility] at [facility], Gerlene Burdock, NP   10 months ago Generalized abdominal pain   Primary Care at [facility], Eilleen Kempf, MD   1 year ago Essential hypertension   Primary Care at [place], Eilleen Kempf, MD   1 year ago Essential hypertension   Primary Care at [place], Eilleen Kempf, MD      Future Appointments            In 4 days Sagardia, Eilleen Kempf, MD Primary Care at [place], [facility]           Passed - Last BP in normal range    BP Readings from Last 1 Encounters:  04/14/20 128/82         Passed - Last Heart Rate in normal range    Pulse Readings from Last 1 Encounters:  11/22/19 60

## 2020-07-10 ENCOUNTER — Encounter: Payer: Medicare Other | Admitting: Emergency Medicine

## 2020-07-10 ENCOUNTER — Telehealth: Payer: Self-pay | Admitting: Emergency Medicine

## 2020-07-10 NOTE — Telephone Encounter (Signed)
Spoke with patient to reschedule cpe for 3/21. Patient wants to know if it's ok for her to fast that day. Patient concerned about taking the b/p med too late and doesn't want her blood presure to become elevated. Please advise at [phone].

## 2020-07-11 NOTE — Telephone Encounter (Signed)
Pt will come in for lab only visit one day next week for Lipid and A1c prior to appointment so she can fast. Orders are pended in Chart too soon for CBC or CMP

## 2020-07-16 DIAGNOSIS — Z0000 Encounter for general adult medical examination without abnormal findings: Secondary | ICD-10-CM | POA: Diagnosis not present

## 2020-07-16 NOTE — Addendum Note (Signed)
Addended by: Lorenda Hatchet R on: 07/16/2020 10:33 AM   Modules accepted: Orders

## 2020-07-17 LAB — COMPREHENSIVE METABOLIC PANEL
ALT: 11 IU/L (ref 0–32)
AST: 16 IU/L (ref 0–40)
Albumin/Globulin Ratio: 2.1 (ref 1.2–2.2)
Albumin: 4.4 g/dL (ref 3.8–4.8)
Alkaline Phosphatase: 133 IU/L — ABNORMAL HIGH (ref 44–121)
BUN/Creatinine Ratio: 21 (ref 12–28)
BUN: 19 mg/dL (ref 8–27)
Bilirubin Total: 0.2 mg/dL (ref 0.0–1.2)
CO2: 24 mmol/L (ref 20–29)
Calcium: 9.6 mg/dL (ref 8.7–10.3)
Chloride: 101 mmol/L (ref 96–106)
Creatinine, Ser: 0.89 mg/dL (ref 0.57–1.00)
Globulin, Total: 2.1 g/dL (ref 1.5–4.5)
Glucose: 100 mg/dL — ABNORMAL HIGH (ref 65–99)
Potassium: 4.2 mmol/L (ref 3.5–5.2)
Sodium: 142 mmol/L (ref 134–144)
Total Protein: 6.5 g/dL (ref 6.0–8.5)
eGFR: 70 mL/min/{1.73_m2} (ref >59)

## 2020-07-17 LAB — HEMOGLOBIN A1C
Est. average glucose Bld gHb Est-mCnc: 143 mg/dL
Hgb A1c MFr Bld: 6.6 % — ABNORMAL HIGH (ref 4.8–5.6)

## 2020-07-17 LAB — LIPID PANEL
Chol/HDL Ratio: 2.9 ratio (ref 0.0–4.4)
Cholesterol, Total: 211 mg/dL — ABNORMAL HIGH (ref 100–199)
HDL: 72 mg/dL (ref >39)
LDL Chol Calc (NIH): 116 mg/dL — ABNORMAL HIGH (ref 0–99)
Triglycerides: 131 mg/dL (ref 0–149)
VLDL Cholesterol Cal: 23 mg/dL (ref 5–40)

## 2020-07-21 ENCOUNTER — Other Ambulatory Visit: Payer: Self-pay

## 2020-07-21 ENCOUNTER — Ambulatory Visit (INDEPENDENT_AMBULATORY_CARE_PROVIDER_SITE_OTHER): Payer: Medicare Other | Admitting: Emergency Medicine

## 2020-07-21 ENCOUNTER — Encounter: Payer: Self-pay | Admitting: Emergency Medicine

## 2020-07-21 VITALS — BP 112/57 | HR 55 | Temp 97.6°F | Resp 16 | Ht 65.5 in | Wt 265.0 lb

## 2020-07-21 DIAGNOSIS — G4733 Obstructive sleep apnea (adult) (pediatric): Secondary | ICD-10-CM

## 2020-07-21 DIAGNOSIS — E1165 Type 2 diabetes mellitus with hyperglycemia: Secondary | ICD-10-CM

## 2020-07-21 DIAGNOSIS — Z6841 Body Mass Index (BMI) 40.0 and over, adult: Secondary | ICD-10-CM

## 2020-07-21 DIAGNOSIS — Z0001 Encounter for general adult medical examination with abnormal findings: Secondary | ICD-10-CM

## 2020-07-21 DIAGNOSIS — G40909 Epilepsy, unspecified, not intractable, without status epilepticus: Secondary | ICD-10-CM | POA: Diagnosis not present

## 2020-07-21 DIAGNOSIS — I10 Essential (primary) hypertension: Secondary | ICD-10-CM

## 2020-07-21 DIAGNOSIS — D709 Neutropenia, unspecified: Secondary | ICD-10-CM

## 2020-07-21 DIAGNOSIS — E785 Hyperlipidemia, unspecified: Secondary | ICD-10-CM | POA: Diagnosis not present

## 2020-07-21 NOTE — Progress Notes (Signed)
[name] [name] 70 y.o.   Chief Complaint  Patient presents with  . Annual Exam    HISTORY OF PRESENT ILLNESS: This is a 70 y.o. female here for her annual exam. Has history of seizure disorder.  Saw her neurologist last December 2021.  Presently on Tegretol and Keppra. History of dyslipidemia on Crestor 20 mg daily. History of generalized anxiety depression on Lexapro 10 mg daily. History of hypertension on Zestoretic 20-25 and carvedilol. History of sleep apnea. History of prediabetes with most recent hemoglobin A1c: Lab Results  Component Value Date   HGBA1C 6.6 (H) 07/16/2020  History of chronic neutropenia.  No history of anemia. Normal CMP.  Abnormal lipid profile with slightly elevated total cholesterol and LDL. Also cardiac history on carvedilol. Concerned about recent blood work showing diabetes. No other complaints or medical concerns. Normal mammogram last year. Normal colonoscopy about 5 years ago. Fully vaccinated against COVID with a booster.   HPI   Prior to Admission medications   Medication Sig Start Date End Date Taking? Authorizing Provider  aspirin 81 MG chewable tablet Chew 81 mg by mouth daily.   Yes [provider]  carbamazepine (TEGRETOL) 200 MG tablet Take 1.5 tablets twice daily 04/17/20  Yes Glean Salvo, NP  carvedilol (COREG) 3.125 MG tablet TAKE 1 TABLET(3.125 MG) BY MOUTH TWICE DAILY WITH A MEAL 07/06/20  Yes Kyisha Fowle, Eilleen Kempf, MD  cholecalciferol (VITAMIN D3) 25 MCG (1000 UT) tablet Take 1,000 Units by mouth daily.   Yes [provider]  escitalopram (LEXAPRO) 10 MG tablet TAKE 1 TABLET(10 MG) BY MOUTH AT BEDTIME 06/22/20  Yes Elin Seats, Eilleen Kempf, MD  levETIRAcetam (KEPPRA) 750 MG tablet TAKE 2 TABLETS(1500 MG) BY MOUTH TWICE DAILY 10/23/19  Yes Glean Salvo, NP  lisinopril-hydrochlorothiazide (ZESTORETIC) 20-25 MG tablet TAKE 1 TABLET BY MOUTH DAILY. 06/22/20  Yes Aurea Aronov, Eilleen Kempf, MD  Multiple  Vitamins-Minerals (MULTIVITAMIN PO) Take 1 tablet by mouth daily.   Yes [provider]  rosuvastatin (CRESTOR) 20 MG tablet Take 1 tablet (20 mg total) by mouth daily. 11/24/19  Yes SagardiaEilleen Kempf, MD    No Known Allergies  Patient Active Problem List   Diagnosis Date Noted  . Seizure disorder (HCC) 05/28/2019  . Prediabetes 05/28/2019  . Staring episodes 11/24/2017  . OSA (obstructive sleep apnea) 11/30/2016  . Chronic insomnia 10/13/2016  . Transient cerebral ischemia 08/26/2016  . Transient memory loss 08/26/2016  . Morbid obesity (HCC) 05/31/2016  . Glucose intolerance (impaired glucose tolerance) 05/18/2016  . 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 Generalized anxiety disorder 03/15/2014  . Memory difficulty 03/14/2014  . Demand myocardial ischemic related infarction 01/23/2014    Class: Diagnosis of  . Depression, reactive 10/12/2012  . Chronic neutropenia (HCC) 04/14/2012  . Hyperlipemia 10/20/2011  . HTN (hypertension) 10/20/2011    Past Medical History:  Diagnosis Date  . Anxiety   . Cataract   . Chronic insomnia 10/13/2016  . Depression   . Gait disorder 03/14/2014  . HTN (hypertension)   . Hx of cardiovascular stress test    Lexiscan Myoview (1/16):  No ischemia, EF 68%, breast atten; Low Risk  . Hyperlipemia   . Hyperlipidemia    Phreesia 07/18/2020  . Hypertension    Phreesia 07/18/2020  . Memory difficulty 03/14/2014  . Meningitis due to bacteria 01-18-14  . OSA (obstructive sleep apnea) 11/30/2016  . Seizures (HCC)   . Sleep apnea    Phreesia 07/18/2020  . TIA (transient  ischemic attack)     Past Surgical History:  Procedure Laterality Date  . NO PAST SURGERIES      Social History   Socioeconomic History  . Marital status: Divorced    Spouse name: Not on file  . Number of children: 1  . Years of education: college  . Highest education level: Some college, no degree  Occupational History  . Occupation: Retired  Financial planner: [employer]  Tobacco Use  . Smoking status: Former Smoker    Packs/day: 0.50    Years: 40.00    Pack years: 20.00    Types: Cigarettes    Quit date: 07/09/2011    Years since quitting: 9.0  . Smokeless tobacco: Never Used  Vaping Use  . Vaping Use: Never used  Substance and Sexual Activity  . Alcohol use: Yes    Alcohol/week: 0.0 standard drinks    Comment: Rare  . Drug use: No  . Sexual activity: Never    Birth control/protection: Abstinence  Other Topics Concern  . Not on file  Social History Narrative   Marital status: divorced x 35 years; not dating; not interested in 2019.      Children: 1 child/son (44); 2 grandchildren; [place]      Lives:  Alone in [place] in [place].      Employment: retired 2008; Administrator, arts.      Tobacco: quit smoking in 2013.  Smoked x many years       Alcohol: none; holidays only      Exercise: none; quit exercising two years ago in 2015.        Seatbelt: 100%; no texting. Does not drive in [address] due to seizure activity.       ADLs: no driving in 8850[address]2774 due to seizure activity; independent with ADLs.  Has cane for long distances      Advanced Directives: none; [name]/sister.  FULL CODE no prolonged measures.         Patient is single lives at home alone, has 1 child   Patient is right handed   Education level is 1 year of college   Caffeine consumption is 2 cups daily   Social Determinants of Health   Financial Resource Strain: Not on file  Food Insecurity: Not on file  Transportation Needs: Not on file  Physical Activity: Not on file  Stress: Not on file  Social Connections: Not on file  Intimate Partner Violence: Not on file    Family History  Problem Relation Age of Onset  . Heart attack Mother 53  . Stroke Mother   . Hypertension Mother   . Heart attack Father 43  . Cerebral aneurysm Sister   . Hypertension Sister   . Kidney disease Sister        ESRD; HD in last year  .  Hypertension Brother   . Hyperlipidemia Brother   . Rheum arthritis Sister   . Arthritis Sister   . Hypertension Brother   . Hyperlipidemia Brother   . Hyperlipidemia Brother   . Hypertension Brother      Review of Systems  Constitutional: Negative.  Negative for chills and fever.  HENT: Negative.  Negative for congestion and sore throat.   Respiratory: Negative.  Negative for cough and shortness of breath.   Cardiovascular: Negative.  Negative for chest pain and palpitations.  Gastrointestinal: Negative.  Negative for abdominal pain, blood in stool, diarrhea, melena, nausea and vomiting.  Genitourinary: Negative.  Negative for  dysuria and hematuria.  Musculoskeletal: Negative.  Negative for back pain, myalgias and neck pain.  Skin: Negative.  Negative for rash.  Neurological: Negative.  Negative for dizziness and headaches.  All other systems reviewed and are negative.   Today's Vitals   07/21/20 1428  BP: (!) 112/57  Pulse: (!) 55  Resp: 16  Temp: 97.6 F (36.4 C)  TempSrc: Temporal  SpO2: 97%  Weight: 265 lb (120.2 kg)  Height: 5' 5.5" (1.664 m)   Body mass index is 43.43 kg/m. Wt Readings from Last 3 Encounters:  07/21/20 265 lb (120.2 kg)  04/14/20 272 lb 3.2 oz (123.5 kg)  11/22/19 270 lb 12.8 oz (122.8 kg)    Physical Exam Vitals reviewed.  Constitutional:      Appearance: She is obese.  HENT:     Head: Normocephalic.  Eyes:     Extraocular Movements: Extraocular movements intact.     Conjunctiva/sclera: Conjunctivae normal.     Pupils: Pupils are equal, round, and reactive to light.  Cardiovascular:     Rate and Rhythm: Normal rate and regular rhythm.     Pulses: Normal pulses.     Heart sounds: Normal heart sounds.  Pulmonary:     Effort: Pulmonary effort is normal.     Breath sounds: Normal breath sounds.  Musculoskeletal:        General: Normal range of motion.     Cervical back: Normal range of motion and neck supple.     Right lower leg: No  edema.     Left lower leg: No edema.  Skin:    General: Skin is warm and dry.     Capillary Refill: Capillary refill takes less than 2 seconds.  Neurological:     General: No focal deficit present.     Mental Status: She is alert and oriented to person, place, and time.  Psychiatric:        Mood and Affect: Mood normal.        Behavior: Behavior normal.      ASSESSMENT & PLAN: Serin was seen today for annual exam.  Diagnoses and all orders for this visit:  Encounter for general adult medical examination with abnormal findings  Neutropenia, unspecified type (HCC)  Nonintractable epilepsy without status epilepticus, unspecified epilepsy type (HCC)  Dyslipidemia  Essential hypertension  OSA (obstructive sleep apnea)  Morbid obesity with BMI of 40.0-44.9, adult (HCC)  Type 2 diabetes mellitus with hyperglycemia, without long-term current use of insulin (HCC)    Patient Instructions   Take Metamucil fiber supplement daily. Start Metformin 500 mg twice a day. Continue rosuvastatin 20 mg daily. Follow-up in 6 months.    If you have lab work done today you will be contacted with your lab results within the next 2 weeks.  If you have not heard from [place] then please contact us. The fastest way to get your results is to register for My Chart.   IF you received an x-ray today, you will receive an invoice from [facility]. Please contact [facility] at [phone] with questions or concerns regarding your invoice.   IF you received labwork today, you will receive an invoice from [facility]. Please contact [facility] at [phone] with questions or concerns regarding your invoice.   Our billing staff will not be able to assist you with questions regarding bills from these companies.  You will be contacted with the lab results as soon as they are available. The fastest way to get your results is  to activate your My Chart account. Instructions are located on  the last page of this paperwork. If you have not heard from [place] regarding the results in 2 weeks, please contact this office.     Fiber Content in Foods Fiber is a substance that is found in plant foods, such as fruits, vegetables, whole grains, nuts, seeds, and beans. As part of your treatment and recovery plan, your health care provider may recommend that you eat foods that have specific amounts of dietary fiber. Some conditions may require a high-fiber diet while others may require a low-fiber diet. This sheet gives you information about the dietary fiber content of some common foods. Your health care provider will tell you how much fiber you need in your diet. If you have problems or questions, contact your health care provider or dietitian. What foods are high in fiber? Fruits  Blackberries or raspberries (fresh) --  cup (75 g) has 4 g of fiber.  Pear (fresh) -- 1 medium (180 g) has 5.5 g of fiber.  Prunes (dried) -- 6 to 8 pieces (57-76 g) has 5 g of fiber.  Apple with skin -- 1 medium (182 g) has 4.8 g of fiber.  Guava -- 1 cup (128 g) has 8.9 g of fiber. Vegetables  Peas (frozen) --  cup (80 g) has 4.4 g of fiber.  Potato with skin (baked) -- 1 medium (173 g) has 4.4 g of fiber.  Pumpkin (canned) --  cup (122 g) has 5 g of fiber.  Brussels sprouts (cooked) --  cup (78 g) has 4 g of fiber.  Sweet potato --  cup mashed (124 g) has 4 g of fiber.  Winter squash -- 1 cup cooked (205 g) has 5.7 g of fiber. Grains  Bran cereal --  cup (31 g) has 8.6 g of fiber.  Bulgur (cooked) --  cup (70 g) has 4 g of fiber.  Quinoa (cooked) -- 1 cup (185 g) has 5.2 g of fiber.  Popcorn -- 3 cups (375 g) popped has 5.8 g of fiber.  Spaghetti, whole wheat -- 1 cup (140 g) has 6 g of fiber. Meats and other proteins  Pinto beans (cooked) --  cup (90 g) has 7.7 g of fiber.  Lentils (cooked) --  cup (90 g) has 7.8 g of fiber.  Kidney beans (canned) --  cup (92.5 g) has 5.7 g of  fiber.  Soybeans (canned, frozen, or fresh) --  cup (92.5 g) has 5.2 g of fiber.  Baked beans, plain or vegetarian (canned) --  cup (130 g) has 5.2 g of fiber.  Garbanzo beans or chickpeas (canned) --  cup (90 g) has 6.6 g of fiber.  Black beans (cooked) --  cup (86 g) has 7.5 g of fiber.  White beans or navy beans (cooked) --  cup (91 g) has 9.3 g of fiber. The items listed above may not be a complete list of foods with high fiber. Actual amounts of fiber may be different depending on processing. Contact a dietitian for more information.   What foods are moderate in fiber? Fruits  Banana -- 1 medium (126 g) has 3.2 g of fiber.  Melon -- 1 cup (155 g) has 1.4 g of fiber.  Orange -- 1 small (154 g) has 3.7 g of fiber.  Raisins --  cup (40 g) has 1.8 g of fiber.  Applesauce, sweetened --  cup (125 g) has 1.5 g of fiber.  Blueberries (fresh) --  cup (75 g) has 1.8 g of fiber.  Strawberries (fresh, sliced) -- 1 cup (150 g) has 3 g of fiber.  Cherries -- 1 cup (140 g) has 2.9 g of fiber. Vegetables  Broccoli (cooked) --  cup (77.5 g) has 2.1 g of fiber.  Carrots (cooked) --  cup (77.5 g) has 2.2 g of fiber.  Corn (canned or frozen) --  cup (82.5 g) has 2.1 g of fiber.  Potatoes, mashed --  cup (105 g) has 1.6 g of fiber.  Tomato -- 1 medium (62 g) has 1.5 g of fiber.  Green beans (canned) --  cup (83 g) has 2 g of fiber.  Squash, winter --  cup (58 g) has 1 g of fiber.  Sweet potato, baked -- 1 medium (150 g) has 3 g of fiber.  Cauliflower (cooked) -- 1/2 cup (90 g) has 2.3 g of fiber. Grains  Long-grain brown rice (cooked) -- 1 cup (196 g) has 3.5 g of fiber.  Bagel, plain -- one 4-inch (10 cm) bagel has 2 g of fiber.  Instant oatmeal --  cup (120 g) has about 2 g of fiber.  Macaroni noodles, enriched (cooked) -- 1 cup (140 g) has 2.5 g of fiber.  Multigrain cereal --  cup (15 g) has about 2-4 g of fiber.  Whole-wheat bread -- 1 slice (26 g)  has 2 g of fiber.  Whole-wheat spaghetti noodles --  cup (70 g) has 3.2 g of fiber.  Corn tortilla -- one 6-inch (15 cm) tortilla has 1.5 g of fiber. Meats and other proteins  Almonds --  cup or 1 oz (28 g) has 3.5 g of fiber.  Sunflower seeds in shell --  cup or  oz (11.5 g) has 1.1 g of fiber.  Vegetable or soy patty -- 1 patty (70 g) has 3.4 g of fiber.  Walnuts --  cup or 1 oz (30 g) has 2 g of fiber.  Flax seed -- 1 Tbsp (7 g) has 2.8 g of fiber. The items listed above may not be a complete list of foods that have moderate amounts of fiber. Actual amounts of fiber may be different depending on processing. Contact a dietitian for more information.   What foods are low in fiber? Low-fiber foods contain less than 1 g of fiber per serving. They include: Fruits  Fruit juice --  cup or 4 fl oz (118 mL) has 0.5 g of fiber. Vegetables  Lettuce -- 1 cup (35 g) has 0.5 g of fiber.  Cucumber (slices) --  cup (60 g) has 0.3 g of fiber.  Celery -- 1 stalk (40 g) has 0.1 g of fiber. Grains  Flour tortilla -- one 6-inch (15 cm) tortilla has 0.5 g of fiber.  White rice (cooked) --  cup (81.5 g) has 0.3 g of fiber. Meats and other proteins  Egg -- 1 large (50 g) has 0 g of fiber.  Meat, poultry, or fish -- 3 oz (85 g) has 0 g of fiber. Dairy  Milk -- 1 cup or 8 fl oz (237 mL) has 0 g of fiber.  Yogurt -- 1 cup (245 g) has 0 g of fiber. The items listed above may not be a complete list of foods that are low in fiber. Actual amounts of fiber may be different depending on processing. Contact a dietitian for more information.   Summary  Fiber is a substance that is found in plant foods, such as fruits,  vegetables, whole grains, nuts, seeds, and beans.  As part of your treatment and recovery plan, your health care provider may recommend that you eat foods that have specific amounts of dietary fiber. This information is not intended to replace advice given to you by your  health care provider. Make sure you discuss any questions you have with your health care provider. Document Revised: 08/23/2019 Document Reviewed: 08/23/2019 Elsevier Patient Education  2021 Elsevier Inc.  Diabetes Mellitus and Nutrition, Adult When you have diabetes, or diabetes mellitus, it is very important to have healthy eating habits because your blood sugar (glucose) levels are greatly affected by what you eat and drink. Eating healthy foods in the right amounts, at about the same times every day, can help you:  Control your blood glucose.  Lower your risk of heart disease.  Improve your blood pressure.  Reach or maintain a healthy weight. What can affect my meal plan? Every person with diabetes is different, and each person has different needs for a meal plan. Your health care provider may recommend that you work with a dietitian to make a meal plan that is best for you. Your meal plan may vary depending on factors such as:  The calories you need.  The medicines you take.  Your weight.  Your blood glucose, blood pressure, and cholesterol levels.  Your activity level.  Other health conditions you have, such as heart or kidney disease. How do carbohydrates affect me? Carbohydrates, also called carbs, affect your blood glucose level more than any other type of food. Eating carbs naturally raises the amount of glucose in your blood. Carb counting is a method for keeping track of how many carbs you eat. Counting carbs is important to keep your blood glucose at a healthy level, especially if you use insulin or take certain oral diabetes medicines. It is important to know how many carbs you can safely have in each meal. This is different for every person. Your dietitian can help you calculate how many carbs you should have at each meal and for each snack. How does alcohol affect me? Alcohol can cause a sudden decrease in blood glucose (hypoglycemia), especially if you use insulin or  take certain oral diabetes medicines. Hypoglycemia can be a life-threatening condition. Symptoms of hypoglycemia, such as sleepiness, dizziness, and confusion, are similar to symptoms of having too much alcohol.  Do not drink alcohol if: ? Your health care provider tells you not to drink. ? You are pregnant, may be pregnant, or are planning to become pregnant.  If you drink alcohol: ? Do not drink on an empty stomach. ? Limit how much you use to:  0-1 drink a day for women.  0-2 drinks a day for men. ? Be aware of how much alcohol is in your drink. In the [place] one drink equals one 12 oz bottle of beer (355 mL), one 5 oz glass of wine (148 mL), or one 1 oz glass of hard liquor (44 mL). ? Keep yourself hydrated with water, diet soda, or unsweetened iced tea.  Keep in mind that regular soda, juice, and other mixers may contain a lot of sugar and must be counted as carbs. What are tips for following this plan? Reading food labels  Start by checking the serving size on the "Nutrition Facts" label of packaged foods and drinks. The amount of calories, carbs, fats, and other nutrients listed on the label is based on one serving of the item. Many items contain more  than one serving per package.  Check the total grams (g) of carbs in one serving. You can calculate the number of servings of carbs in one serving by dividing the total carbs by 15. For example, if a food has 30 g of total carbs per serving, it would be equal to 2 servings of carbs.  Check the number of grams (g) of saturated fats and trans fats in one serving. Choose foods that have a low amount or none of these fats.  Check the number of milligrams (mg) of salt (sodium) in one serving. Most people should limit total sodium intake to less than 2,300 mg per day.  Always check the nutrition information of foods labeled as "low-fat" or "nonfat." These foods may be higher in added sugar or refined carbs and should be avoided.  Talk to  your dietitian to identify your daily goals for nutrients listed on the label. Shopping  Avoid buying canned, pre-made, or processed foods. These foods tend to be high in fat, sodium, and added sugar.  Shop around the outside edge of the grocery store. This is where you will most often find fresh fruits and vegetables, bulk grains, fresh meats, and fresh dairy. Cooking  Use low-heat cooking methods, such as baking, instead of high-heat cooking methods like deep frying.  Cook using healthy oils, such as olive, canola, or sunflower oil.  Avoid cooking with butter, cream, or high-fat meats. Meal planning  Eat meals and snacks regularly, preferably at the same times every day. Avoid going long periods of time without eating.  Eat foods that are high in fiber, such as fresh fruits, vegetables, beans, and whole grains. Talk with your dietitian about how many servings of carbs you can eat at each meal.  Eat 4-6 oz (112-168 g) of lean protein each day, such as lean meat, chicken, fish, eggs, or tofu. One ounce (oz) of lean protein is equal to: ? 1 oz (28 g) of meat, chicken, or fish. ? 1 egg. ?  cup (62 g) of tofu.  Eat some foods each day that contain healthy fats, such as avocado, nuts, seeds, and fish.   What foods should I eat? Fruits Berries. Apples. Oranges. Peaches. Apricots. Plums. Grapes. Mango. Papaya. Pomegranate. Kiwi. Cherries. Vegetables Lettuce. Spinach. Leafy greens, including kale, chard, collard greens, and mustard greens. Beets. Cauliflower. Cabbage. Broccoli. Carrots. Green beans. Tomatoes. Peppers. Onions. Cucumbers. Brussels sprouts. Grains Whole grains, such as whole-wheat or whole-grain bread, crackers, tortillas, cereal, and pasta. Unsweetened oatmeal. Quinoa. Brown or wild rice. Meats and other proteins Seafood. Poultry without skin. Lean cuts of poultry and beef. Tofu. Nuts. Seeds. Dairy Low-fat or fat-free dairy products such as milk, yogurt, and cheese. The  items listed above may not be a complete list of foods and beverages you can eat. Contact a dietitian for more information. What foods should I avoid? Fruits Fruits canned with syrup. Vegetables Canned vegetables. Frozen vegetables with butter or cream sauce. Grains Refined white flour and flour products such as bread, pasta, snack foods, and cereals. Avoid all processed foods. Meats and other proteins Fatty cuts of meat. Poultry with skin. Breaded or fried meats. Processed meat. Avoid saturated fats. Dairy Full-fat yogurt, cheese, or milk. Beverages Sweetened drinks, such as soda or iced tea. The items listed above may not be a complete list of foods and beverages you should avoid. Contact a dietitian for more information. Questions to ask a health care provider  Do I need to meet with a diabetes  educator?  Do I need to meet with a dietitian?  What number can I call if I have questions?  When are the best times to check my blood glucose? Where to find more information:  American Diabetes Association: diabetes.org  Academy of Nutrition and Dietetics: www.eatright.[employer] of Diabetes and Digestive and Kidney Diseases: CarFlippers.tn  Association of Diabetes Care and Education Specialists: www.diabeteseducator.org Summary  It is important to have healthy eating habits because your blood sugar (glucose) levels are greatly affected by what you eat and drink.  A healthy meal plan will help you control your blood glucose and maintain a healthy lifestyle.  Your health care provider may recommend that you work with a dietitian to make a meal plan that is best for you.  Keep in mind that carbohydrates (carbs) and alcohol have immediate effects on your blood glucose levels. It is important to count carbs and to use alcohol carefully. This information is not intended to replace advice given to you by your health care provider. Make sure you discuss any questions you  have with your health care provider. Document Revised: 03/27/2019 Document Reviewed: 03/27/2019 Elsevier Patient Education  2021 Elsevier Inc.      Edwina Barth, MD [facility]

## 2020-07-21 NOTE — Patient Instructions (Addendum)
Take Metamucil fiber supplement daily. Start Metformin 500 mg twice a day. Continue rosuvastatin 20 mg daily. Follow-up in 6 months.    If you have lab work done today you will be contacted with your lab results within the next 2 weeks.  If you have not heard from us then please contact us. The fastest way to get your results is to register for My Chart.   IF you received an x-ray today, you will receive an invoice from [facility]. Please contact [facility] at [phone] with questions or concerns regarding your invoice.   IF you received labwork today, you will receive an invoice from [facility]. Please contact [facility] at [phone] with questions or concerns regarding your invoice.   Our billing staff will not be able to assist you with questions regarding bills from these companies.  You will be contacted with the lab results as soon as they are available. The fastest way to get your results is to activate your My Chart account. Instructions are located on the last page of this paperwork. If you have not heard from us regarding the results in 2 weeks, please contact this office.     Fiber Content in Foods Fiber is a substance that is found in plant foods, such as fruits, vegetables, whole grains, nuts, seeds, and beans. As part of your treatment and recovery plan, your health care provider may recommend that you eat foods that have specific amounts of dietary fiber. Some conditions may require a high-fiber diet while others may require a low-fiber diet. This sheet gives you information about the dietary fiber content of some common foods. Your health care provider will tell you how much fiber you need in your diet. If you have problems or questions, contact your health care provider or dietitian. What foods are high in fiber? Fruits  Blackberries or raspberries (fresh) --  cup (75 g) has 4 g of fiber.  Pear (fresh) -- 1 medium (180 g) has 5.5 g of  fiber.  Prunes (dried) -- 6 to 8 pieces (57-76 g) has 5 g of fiber.  Apple with skin -- 1 medium (182 g) has 4.8 g of fiber.  Guava -- 1 cup (128 g) has 8.9 g of fiber. Vegetables  Peas (frozen) --  cup (80 g) has 4.4 g of fiber.  Potato with skin (baked) -- 1 medium (173 g) has 4.4 g of fiber.  Pumpkin (canned) --  cup (122 g) has 5 g of fiber.  Brussels sprouts (cooked) --  cup (78 g) has 4 g of fiber.  Sweet potato --  cup mashed (124 g) has 4 g of fiber.  Winter squash -- 1 cup cooked (205 g) has 5.7 g of fiber. Grains  Bran cereal --  cup (31 g) has 8.6 g of fiber.  Bulgur (cooked) --  cup (70 g) has 4 g of fiber.  Quinoa (cooked) -- 1 cup (185 g) has 5.2 g of fiber.  Popcorn -- 3 cups (375 g) popped has 5.8 g of fiber.  Spaghetti, whole wheat -- 1 cup (140 g) has 6 g of fiber. Meats and other proteins  Pinto beans (cooked) --  cup (90 g) has 7.7 g of fiber.  Lentils (cooked) --  cup (90 g) has 7.8 g of fiber.  Kidney beans (canned) --  cup (92.5 g) has 5.7 g of fiber.  Soybeans (canned, frozen, or fresh) --  cup (92.5 g) has 5.2 g of fiber.  Baked beans,  plain or vegetarian (canned) --  cup (130 g) has 5.2 g of fiber.  Garbanzo beans or chickpeas (canned) --  cup (90 g) has 6.6 g of fiber.  Black beans (cooked) --  cup (86 g) has 7.5 g of fiber.  White beans or navy beans (cooked) --  cup (91 g) has 9.3 g of fiber. The items listed above may not be a complete list of foods with high fiber. Actual amounts of fiber may be different depending on processing. Contact a dietitian for more information.   What foods are moderate in fiber? Fruits  Banana -- 1 medium (126 g) has 3.2 g of fiber.  Melon -- 1 cup (155 g) has 1.4 g of fiber.  Orange -- 1 small (154 g) has 3.7 g of fiber.  Raisins --  cup (40 g) has 1.8 g of fiber.  Applesauce, sweetened --  cup (125 g) has 1.5 g of fiber.  Blueberries (fresh) --  cup (75 g) has 1.8 g of  fiber.  Strawberries (fresh, sliced) -- 1 cup (150 g) has 3 g of fiber.  Cherries -- 1 cup (140 g) has 2.9 g of fiber. Vegetables  Broccoli (cooked) --  cup (77.5 g) has 2.1 g of fiber.  Carrots (cooked) --  cup (77.5 g) has 2.2 g of fiber.  Corn (canned or frozen) --  cup (82.5 g) has 2.1 g of fiber.  Potatoes, mashed --  cup (105 g) has 1.6 g of fiber.  Tomato -- 1 medium (62 g) has 1.5 g of fiber.  Green beans (canned) --  cup (83 g) has 2 g of fiber.  Squash, winter --  cup (58 g) has 1 g of fiber.  Sweet potato, baked -- 1 medium (150 g) has 3 g of fiber.  Cauliflower (cooked) -- 1/2 cup (90 g) has 2.3 g of fiber. Grains  Long-grain brown rice (cooked) -- 1 cup (196 g) has 3.5 g of fiber.  Bagel, plain -- one 4-inch (10 cm) bagel has 2 g of fiber.  Instant oatmeal --  cup (120 g) has about 2 g of fiber.  Macaroni noodles, enriched (cooked) -- 1 cup (140 g) has 2.5 g of fiber.  Multigrain cereal --  cup (15 g) has about 2-4 g of fiber.  Whole-wheat bread -- 1 slice (26 g) has 2 g of fiber.  Whole-wheat spaghetti noodles --  cup (70 g) has 3.2 g of fiber.  Corn tortilla -- one 6-inch (15 cm) tortilla has 1.5 g of fiber. Meats and other proteins  Almonds --  cup or 1 oz (28 g) has 3.5 g of fiber.  Sunflower seeds in shell --  cup or  oz (11.5 g) has 1.1 g of fiber.  Vegetable or soy patty -- 1 patty (70 g) has 3.4 g of fiber.  Walnuts --  cup or 1 oz (30 g) has 2 g of fiber.  Flax seed -- 1 Tbsp (7 g) has 2.8 g of fiber. The items listed above may not be a complete list of foods that have moderate amounts of fiber. Actual amounts of fiber may be different depending on processing. Contact a dietitian for more information.   What foods are low in fiber? Low-fiber foods contain less than 1 g of fiber per serving. They include: Fruits  Fruit juice --  cup or 4 fl oz (118 mL) has 0.5 g of fiber. Vegetables  Lettuce -- 1 cup (35 g) has 0.5  g of  fiber.  Cucumber (slices) --  cup (60 g) has 0.3 g of fiber.  Celery -- 1 stalk (40 g) has 0.1 g of fiber. Grains  Flour tortilla -- one 6-inch (15 cm) tortilla has 0.5 g of fiber.  White rice (cooked) --  cup (81.5 g) has 0.3 g of fiber. Meats and other proteins  Egg -- 1 large (50 g) has 0 g of fiber.  Meat, poultry, or fish -- 3 oz (85 g) has 0 g of fiber. Dairy  Milk -- 1 cup or 8 fl oz (237 mL) has 0 g of fiber.  Yogurt -- 1 cup (245 g) has 0 g of fiber. The items listed above may not be a complete list of foods that are low in fiber. Actual amounts of fiber may be different depending on processing. Contact a dietitian for more information.   Summary  Fiber is a substance that is found in plant foods, such as fruits, vegetables, whole grains, nuts, seeds, and beans.  As part of your treatment and recovery plan, your health care provider may recommend that you eat foods that have specific amounts of dietary fiber. This information is not intended to replace advice given to you by your health care provider. Make sure you discuss any questions you have with your health care provider. Document Revised: 08/23/2019 Document Reviewed: 08/23/2019 Elsevier Patient Education  2021 Elsevier Inc.  Diabetes Mellitus and Nutrition, Adult When you have diabetes, or diabetes mellitus, it is very important to have healthy eating habits because your blood sugar (glucose) levels are greatly affected by what you eat and drink. Eating healthy foods in the right amounts, at about the same times every day, can help you:  Control your blood glucose.  Lower your risk of heart disease.  Improve your blood pressure.  Reach or maintain a healthy weight. What can affect my meal plan? Every person with diabetes is different, and each person has different needs for a meal plan. Your health care provider may recommend that you work with a dietitian to make a meal plan that is best for you. Your meal  plan may vary depending on factors such as:  The calories you need.  The medicines you take.  Your weight.  Your blood glucose, blood pressure, and cholesterol levels.  Your activity level.  Other health conditions you have, such as heart or kidney disease. How do carbohydrates affect me? Carbohydrates, also called carbs, affect your blood glucose level more than any other type of food. Eating carbs naturally raises the amount of glucose in your blood. Carb counting is a method for keeping track of how many carbs you eat. Counting carbs is important to keep your blood glucose at a healthy level, especially if you use insulin or take certain oral diabetes medicines. It is important to know how many carbs you can safely have in each meal. This is different for every person. Your dietitian can help you calculate how many carbs you should have at each meal and for each snack. How does alcohol affect me? Alcohol can cause a sudden decrease in blood glucose (hypoglycemia), especially if you use insulin or take certain oral diabetes medicines. Hypoglycemia can be a life-threatening condition. Symptoms of hypoglycemia, such as sleepiness, dizziness, and confusion, are similar to symptoms of having too much alcohol.  Do not drink alcohol if: ? Your health care provider tells you not to drink. ? You are pregnant, may be pregnant, or are planning  to become pregnant.  If you drink alcohol: ? Do not drink on an empty stomach. ? Limit how much you use to:  0-1 drink a day for women.  0-2 drinks a day for men. ? Be aware of how much alcohol is in your drink. In the [place] one drink equals one 12 oz bottle of beer (355 mL), one 5 oz glass of wine (148 mL), or one 1 oz glass of hard liquor (44 mL). ? Keep yourself hydrated with water, diet soda, or unsweetened iced tea.  Keep in mind that regular soda, juice, and other mixers may contain a lot of sugar and must be counted as carbs. What are tips for  following this plan? Reading food labels  Start by checking the serving size on the "Nutrition Facts" label of packaged foods and drinks. The amount of calories, carbs, fats, and other nutrients listed on the label is based on one serving of the item. Many items contain more than one serving per package.  Check the total grams (g) of carbs in one serving. You can calculate the number of servings of carbs in one serving by dividing the total carbs by 15. For example, if a food has 30 g of total carbs per serving, it would be equal to 2 servings of carbs.  Check the number of grams (g) of saturated fats and trans fats in one serving. Choose foods that have a low amount or none of these fats.  Check the number of milligrams (mg) of salt (sodium) in one serving. Most people should limit total sodium intake to less than 2,300 mg per day.  Always check the nutrition information of foods labeled as "low-fat" or "nonfat." These foods may be higher in added sugar or refined carbs and should be avoided.  Talk to your dietitian to identify your daily goals for nutrients listed on the label. Shopping  Avoid buying canned, pre-made, or processed foods. These foods tend to be high in fat, sodium, and added sugar.  Shop around the outside edge of the grocery store. This is where you will most often find fresh fruits and vegetables, bulk grains, fresh meats, and fresh dairy. Cooking  Use low-heat cooking methods, such as baking, instead of high-heat cooking methods like deep frying.  Cook using healthy oils, such as olive, canola, or sunflower oil.  Avoid cooking with butter, cream, or high-fat meats. Meal planning  Eat meals and snacks regularly, preferably at the same times every day. Avoid going long periods of time without eating.  Eat foods that are high in fiber, such as fresh fruits, vegetables, beans, and whole grains. Talk with your dietitian about how many servings of carbs you can eat at  each meal.  Eat 4-6 oz (112-168 g) of lean protein each day, such as lean meat, chicken, fish, eggs, or tofu. One ounce (oz) of lean protein is equal to: ? 1 oz (28 g) of meat, chicken, or fish. ? 1 egg. ?  cup (62 g) of tofu.  Eat some foods each day that contain healthy fats, such as avocado, nuts, seeds, and fish.   What foods should I eat? Fruits Berries. Apples. Oranges. Peaches. Apricots. Plums. Grapes. Mango. Papaya. Pomegranate. Kiwi. Cherries. Vegetables Lettuce. Spinach. Leafy greens, including kale, chard, collard greens, and mustard greens. Beets. Cauliflower. Cabbage. Broccoli. Carrots. Green beans. Tomatoes. Peppers. Onions. Cucumbers. Brussels sprouts. Grains Whole grains, such as whole-wheat or whole-grain bread, crackers, tortillas, cereal, and pasta. Unsweetened oatmeal. Quinoa. [place] or  wild rice. Meats and other proteins Seafood. Poultry without skin. Lean cuts of poultry and beef. Tofu. Nuts. Seeds. Dairy Low-fat or fat-free dairy products such as milk, yogurt, and cheese. The items listed above may not be a complete list of foods and beverages you can eat. Contact a dietitian for more information. What foods should I avoid? Fruits Fruits canned with syrup. Vegetables Canned vegetables. Frozen vegetables with butter or cream sauce. Grains Refined white flour and flour products such as bread, pasta, snack foods, and cereals. Avoid all processed foods. Meats and other proteins Fatty cuts of meat. Poultry with skin. Breaded or fried meats. Processed meat. Avoid saturated fats. Dairy Full-fat yogurt, cheese, or milk. Beverages Sweetened drinks, such as soda or iced tea. The items listed above may not be a complete list of foods and beverages you should avoid. Contact a dietitian for more information. Questions to ask a health care provider  Do I need to meet with a diabetes educator?  Do I need to meet with a dietitian?  What number can I call if I have  questions?  When are the best times to check my blood glucose? Where to find more information:  American Diabetes Association: diabetes.org  Academy of Nutrition and Dietetics: www.eatright.[employer] of Diabetes and Digestive and Kidney Diseases: CarFlippers.tn  Association of Diabetes Care and Education Specialists: www.diabeteseducator.org Summary  It is important to have healthy eating habits because your blood sugar (glucose) levels are greatly affected by what you eat and drink.  A healthy meal plan will help you control your blood glucose and maintain a healthy lifestyle.  Your health care provider may recommend that you work with a dietitian to make a meal plan that is best for you.  Keep in mind that carbohydrates (carbs) and alcohol have immediate effects on your blood glucose levels. It is important to count carbs and to use alcohol carefully. This information is not intended to replace advice given to you by your health care provider. Make sure you discuss any questions you have with your health care provider. Document Revised: 03/27/2019 Document Reviewed: 03/27/2019 Elsevier Patient Education  2021 [employer].

## 2020-07-22 ENCOUNTER — Encounter: Payer: Self-pay | Admitting: Emergency Medicine

## 2020-07-22 ENCOUNTER — Other Ambulatory Visit: Payer: Self-pay | Admitting: Emergency Medicine

## 2020-07-22 DIAGNOSIS — E1165 Type 2 diabetes mellitus with hyperglycemia: Secondary | ICD-10-CM

## 2020-07-22 MED ORDER — METFORMIN HCL 500 MG PO TABS
500.0000 mg | ORAL_TABLET | Freq: Two times a day (BID) | ORAL | 3 refills | Status: DC
Start: 2020-07-22 — End: 2021-07-12

## 2020-07-22 NOTE — Telephone Encounter (Signed)
Patient was seen yesterday and was supposed have a new med sent over for metFORMIN 500 mg 2 x a day   Pharmacy has not received anything    Please advise   Send to : [id number] [place], [place] - [address] AT [facility] [address]  [address] [place] [address]  Phone:  [phone] Fax:  [phone]  DEA #:  [id number]

## 2020-07-29 ENCOUNTER — Encounter: Payer: Medicare Other | Admitting: Emergency Medicine

## 2020-08-18 DIAGNOSIS — G4733 Obstructive sleep apnea (adult) (pediatric): Secondary | ICD-10-CM | POA: Diagnosis not present

## 2020-08-25 ENCOUNTER — Other Ambulatory Visit: Payer: Self-pay | Admitting: Emergency Medicine

## 2020-08-25 DIAGNOSIS — Z1231 Encounter for screening mammogram for malignant neoplasm of breast: Secondary | ICD-10-CM

## 2020-09-17 DIAGNOSIS — G4733 Obstructive sleep apnea (adult) (pediatric): Secondary | ICD-10-CM | POA: Diagnosis not present

## 2020-09-18 ENCOUNTER — Other Ambulatory Visit: Payer: Self-pay | Admitting: Emergency Medicine

## 2020-10-07 ENCOUNTER — Telehealth: Payer: Self-pay | Admitting: Neurology

## 2020-10-07 NOTE — Telephone Encounter (Signed)
She should be able to drive once seizure free for 6 months.

## 2020-10-07 NOTE — Telephone Encounter (Signed)
Pt called needing to speak to the provider or RN regarding her being able to drive at least short distances and also is needing to discuss the Cpap water chamber that she has been receiving cracked. Pt states she has been using the cpap without the chamber. Please advise.

## 2020-10-07 NOTE — Telephone Encounter (Addendum)
I called patient I relayed that [name] [name] reiterated if no seizures for 6 months she is okay to drive.  Also on the cracked water chamber of her CPAP machine she will get a replacement  in 7 to 10 days.  I relayed  she just needs to clean it daily like she had been doing and just keep it in the machine and then she can replace it when she gets the new part .  She stated she has been using the machine and she has been tolerating it fine.   She verbalized understanding call back as needed. She appreciated call back.

## 2020-10-07 NOTE — Telephone Encounter (Signed)
I called patient.  She stated she has had no seizures tolerating her medications well.  No seizures from November 2021. She is asking about driving and it being over 6 months she should be able to drive.  I relayed that to her.  She does have questions about a cracked water chamber she has ordered a new one and is getting it from her medical equipment company that she questions whether it needs to remain inside or outside her machine.  I told her to really answer the question would be her DME company and they deferred to [place] and I be glad to ask and then just let her know.  She appreciated call back.

## 2020-10-15 ENCOUNTER — Ambulatory Visit
Admission: RE | Admit: 2020-10-15 | Discharge: 2020-10-15 | Disposition: A | Discharge status: Home / Self Care | Payer: Medicare Other | Source: Ambulatory Visit | Attending: Emergency Medicine | Admitting: Emergency Medicine

## 2020-10-15 ENCOUNTER — Other Ambulatory Visit: Payer: Self-pay

## 2020-10-15 DIAGNOSIS — Z1231 Encounter for screening mammogram for malignant neoplasm of breast: Secondary | ICD-10-CM

## 2020-10-27 ENCOUNTER — Other Ambulatory Visit: Payer: Self-pay | Admitting: Emergency Medicine

## 2020-10-27 DIAGNOSIS — I10 Essential (primary) hypertension: Secondary | ICD-10-CM

## 2020-10-29 ENCOUNTER — Ambulatory Visit: Payer: Medicare Other | Admitting: Neurology

## 2020-10-29 ENCOUNTER — Encounter: Payer: Self-pay | Admitting: Neurology

## 2020-10-29 VITALS — BP 106/66 | HR 55 | Ht 65.0 in | Wt 242.0 lb

## 2020-10-29 DIAGNOSIS — G40909 Epilepsy, unspecified, not intractable, without status epilepticus: Secondary | ICD-10-CM

## 2020-10-29 DIAGNOSIS — G4733 Obstructive sleep apnea (adult) (pediatric): Secondary | ICD-10-CM

## 2020-10-29 DIAGNOSIS — Z9989 Dependence on other enabling machines and devices: Secondary | ICD-10-CM | POA: Diagnosis not present

## 2020-10-29 MED ORDER — LEVETIRACETAM 750 MG PO TABS: ORAL_TABLET | ORAL | 4 refills | Status: DC

## 2020-10-29 MED ORDER — CARBAMAZEPINE 200 MG PO TABS: ORAL_TABLET | ORAL | 4 refills | Status: DC

## 2020-10-29 NOTE — Patient Instructions (Signed)
Will check CPAP download  Continue current medications Check labs today Let me know if you have any seizures See you back in 1 year

## 2020-10-29 NOTE — Progress Notes (Signed)
PATIENT: [name] [name] DOB: 1951-02-25  REASON FOR VISIT: follow up HISTORY FROM: patient  HISTORY OF PRESENT ILLNESS: Today 10/29/20 [name] [name] is a 70 year old female with history of seizures (staring episodes).  On carbamazepine and Keppra.  Last seizure event was around November 2021.  Carbamazepine was increased to 300 mg twice a day.  EEG was normal in January 2022. Diagnosed with Type 2 DM, A1C 6.6, started metformin, has lost 20 lbs.   CPAP download is listed below, overall shows excellent compliance, AHI is well treated at 1.0, leak in the 95th percentile is 15.1. Small issue with humidification tub having leaks, sent replacement parts 3 times, all with cracks, wants to go without using humidification at all. Denies any dry mouth. Uses full face mask. Missed 1 night when she was out of town. 2 nights she couldn't wear it well because of 2 molars, keeps having to adjust the mask from this healing. Sometimes seems like a leak.     Update 04/14/2020 SS: [name] [name] is a 70 year old female with history of episodes of staring off, likely representing seizures.  She is on carbamazepine and Keppra.  She is on CPAP.  Seizure occurred 10/23/2019, carbamazepine increased 200/300 mg daily. Was at her sister's house, she froze, when to reach for purse, then didn't respond, 30 seconds to 1 minute. After, didn't remember was confused, she remembers looking at her purse, she remembers hearing her sister. "Tell me again what happened". Her sister thinks she had another on the phone a few months ago, she says she doesn't remember, "I blocked her out, intentionally". On November 2, she was eating, kept chewing, just didn't respond, sister says "her chew was different". Before felt dizzy, she checked her BP it was okay. Is on CPAP, doing well.  Lives alone, is compliant with medications.  Presents today for evaluation accompanied by her sister.  HISTORY 10/23/2019 SS: [name] [name] is a 70 year old  female with history of episodes of staring off, likely representing seizures.  She remains on carbamazepine and Keppra.  Her last seizure occurred in May 2019.  She is on CPAP.  Her CPAP download indicates excellent compliance, greater than 4 hours of use 100%, 30/30 days, average usage was 7 hours and 51 minutes.  Set pressure is 13 cmH2O.  Leak in the 95th percentile is 1.7, AHI is 1.2.  She indicates her equipment is working properly, denies significant daytime drowsiness.  ESS was 10.  She does have fatigue felt to be related to being overweight.  She has restarted working on dieting.  Denies any new problems or concerns.  Lives alone, drives a car without difficulty.  Presents today for evaluation unaccompanied.   REVIEW OF SYSTEMS: Out of a complete 14 system review of symptoms, the patient complains only of the following symptoms, and all other reviewed systems are negative.  Seizures, sleep apnea   ALLERGIES: No Known Allergies  HOME MEDICATIONS: Outpatient Medications Prior to Visit  Medication Sig Dispense Refill   aspirin 81 MG chewable tablet Chew 81 mg by mouth daily.     carvedilol (COREG) 3.125 MG tablet TAKE 1 TABLET(3.125 MG) BY MOUTH TWICE DAILY WITH A MEAL 180 tablet 0   cholecalciferol (VITAMIN D3) 25 MCG (1000 UT) tablet Take 1,000 Units by mouth daily.     escitalopram (LEXAPRO) 10 MG tablet TAKE 1 TABLET(10 MG) BY MOUTH AT BEDTIME 90 tablet 0   lisinopril-hydrochlorothiazide (ZESTORETIC) 20-25 MG tablet TAKE 1 TABLET BY MOUTH DAILY 90  tablet 0   metFORMIN (GLUCOPHAGE) 500 MG tablet Take 1 tablet (500 mg total) by mouth 2 (two) times daily with a meal. 180 tablet 3   Multiple Vitamins-Minerals (MULTIVITAMIN PO) Take 1 tablet by mouth daily.     rosuvastatin (CRESTOR) 20 MG tablet Take 1 tablet (20 mg total) by mouth daily. 90 tablet 3   carbamazepine (TEGRETOL) 200 MG tablet Take 1.5 tablets twice daily 270 tablet 4   levETIRAcetam (KEPPRA) 750 MG tablet TAKE 2 TABLETS(1500  MG) BY MOUTH TWICE DAILY 360 tablet 4   No facility-administered medications prior to visit.    PAST MEDICAL HISTORY: Past Medical History:  Diagnosis Date   Anxiety    Cataract    Chronic insomnia 10/13/2016   Depression    Gait disorder 03/14/2014   HTN (hypertension)    Hx of cardiovascular stress test    Lexiscan Myoview (1/16):  No ischemia, EF 68%, breast atten; Low Risk   Hyperlipemia    Hyperlipidemia    Phreesia 07/18/2020   Hypertension    [employer]reesia 07/18/2020   Memory difficulty 03/14/2014   Meningitis due to bacteria 01-18-14   OSA (obstructive sleep apnea) 11/30/2016   Seizures (HCC)    Sleep apnea    [facility]reesia 07/18/2020   TIA (transient ischemic attack)     PAST SURGICAL HISTORY: Past Surgical History:  Procedure Laterality Date   NO PAST SURGERIES      FAMILY HISTORY: Family History  Problem Relation Age of Onset   Heart attack Mother 59   Stroke Mother    Hypertension Mother    Heart attack Father 50   Cerebral aneurysm Sister    Hypertension Sister    Kidney disease Sister        ESRD; HD in last year   Hypertension Brother    Hyperlipidemia Brother    Rheum arthritis Sister    Arthritis Sister    Hypertension Brother    Hyperlipidemia Brother    Hyperlipidemia Brother    Hypertension Brother     SOCIAL HISTORY: Social History   Socioeconomic History   Marital status: Divorced    Spouse name: Not on file   Number of children: 1   Years of education: college   Highest education level: Some college, no degree  Occupational History   Occupation: Retired Financial planner: [employer]  Tobacco Use   Smoking status: Former    Packs/day: 0.50    Years: 40.00    Pack years: 20.00    Types: Cigarettes    Quit date: 07/09/2011    Years since quitting: 9.3   Smokeless tobacco: Never  Vaping Use   Vaping Use: Never used  Substance and Sexual Activity   Alcohol use: Yes    Alcohol/week: 0.0 standard drinks    Comment: Rare   Drug use: No    Sexual activity: Never    Birth control/protection: Abstinence  Other Topics Concern   Not on file  Social History Narrative   Marital status: divorced x 35 years; not dating; not interested in 2019.      Children: 1 child/son (44); 2 grandchildren; [place]      Lives:  Alone in [place] in [place].      Employment: retired 2008; Administrator, arts.      Tobacco: quit smoking in 2013.  Smoked x many years       Alcohol: none; holidays only      Exercise: none; quit exercising two years ago  in 2015.        Seatbelt: 100%; no texting. Does not drive in [address] due to seizure activity.       ADLs: no driving in [place] due to seizure activity; independent with ADLs.  Has cane for long distances      Advanced Directives: none; [name]/sister.  FULL CODE no prolonged measures.         Patient is single lives at home alone, has 1 child   Patient is right handed   Education level is 1 year of college   Caffeine consumption is 2 cups daily   Social Determinants of Health   Financial Resource Strain: Not on file  Food Insecurity: Not on file  Transportation Needs: Not on file  Physical Activity: Not on file  Stress: Not on file  Social Connections: Not on file  Intimate Partner Violence: Not on file   PHYSICAL EXAM  Vitals:   10/29/20 1112  BP: 106/66  Pulse: (!) 55  Weight: 242 lb (109.8 kg)  Height: 5\' 5"  (1.651 m)    Body mass index is 40.27 kg/m.  Generalized: Well developed, in no acute distress   Neurological examination  Mentation: Alert oriented to time, place, history taking. Follows all commands speech and language fluent Cranial nerve II-XII: Pupils were equal round reactive to light. Extraocular movements were full, visual field were full on confrontational test. Facial sensation and strength were normal. Head turning and shoulder shrug  were normal and symmetric. Motor: The motor testing reveals 5 over 5 strength of all 4 extremities. Good  symmetric motor tone is noted throughout.  Sensory: Sensory testing is intact to soft touch on all 4 extremities. No evidence of extinction is noted.  Coordination: Cerebellar testing reveals good finger-nose-finger and heel-to-shin bilaterally.  Gait and station: Gait is normal.  Reflexes: Deep tendon reflexes are symmetric but decreased throughout  DIAGNOSTIC DATA (LABS, IMAGING, TESTING) - I reviewed patient records, labs, notes, testing and imaging myself where available.  Lab Results  Component Value Date   WBC 3.3 (L) 04/14/2020   HGB 11.4 04/14/2020   HCT 35.0 04/14/2020   MCV 80 04/14/2020   PLT 237 04/14/2020      Component Value Date/Time   NA 142 07/16/2020 1058   K 4.2 07/16/2020 1058   CL 101 07/16/2020 1058   CO2 24 07/16/2020 1058   GLUCOSE 100 (H) 07/16/2020 1058   GLUCOSE 122 (H) 09/25/2016 1834   BUN 19 07/16/2020 1058   CREATININE 0.89 07/16/2020 1058   CREATININE 0.89 10/28/2015 1004   CALCIUM 9.6 07/16/2020 1058   PROT 6.5 07/16/2020 1058   ALBUMIN 4.4 07/16/2020 1058   AST 16 07/16/2020 1058   ALT 11 07/16/2020 1058   ALKPHOS 133 (H) 07/16/2020 1058   BILITOT 0.2 07/16/2020 1058   GFRNONAA 70 04/14/2020 1609   GFRNONAA 79 10/17/2013 1327   GFRAA 81 04/14/2020 1609   GFRAA >89 10/17/2013 1327   Lab Results  Component Value Date   CHOL 211 (H) 07/16/2020   HDL 72 07/16/2020   LDLCALC 116 (H) 07/16/2020   TRIG 131 07/16/2020   CHOLHDL 2.9 07/16/2020   Lab Results  Component Value Date   HGBA1C 6.6 (H) 07/16/2020   No results found for: 07/18/2020 Lab Results  Component Value Date   TSH 2.570 05/26/2018    ASSESSMENT AND PLAN 70 y.o. year old female  has a past medical history of Anxiety, Cataract, Chronic insomnia (10/13/2016), Depression, Gait disorder (03/14/2014),  HTN (hypertension), cardiovascular stress test, Hyperlipemia, Hyperlipidemia, Hypertension, Memory difficulty (03/14/2014), Meningitis due to bacteria (01-18-14), OSA  (obstructive sleep apnea) (11/30/2016), Seizures (HCC), Sleep apnea, and TIA (transient ischemic attack). here with:  1.  Episodes of transient alteration of awareness, probable seizures -No recurrent spells since November 2021 -Continue Keppra 750 mg, 2 tablets twice a day -Continue carbamazepine 200 mg, 1.5 tablets twice a day -Check routine labs -EEG was normal in January 2022 -Follow-up in 1 year or sooner if needed, call for spells   2.  OSA on CPAP -CPAP download shows overall excellent compliance -Encouraged to continue to use nightly greater than 4 hours -Will send an order for new supplies, also reach out to CPAP [employer] rep to determine why she keeps getting a cracked humidifier   I spent 30 minutes of face-to-face and non-face-to-face time with patient.  This included previsit chart review, lab review, study review, order entry, discussing CPAP, medications, management and follow-up.  Margie Ege, AGNP-C, DNP 10/29/2020, 12:33 PM [facility] [address] [place], [place] [address] [phone]

## 2020-10-30 LAB — CBC WITH DIFFERENTIAL/PLATELET
Basophils Absolute: 0 10*3/uL (ref 0.0–0.2)
Basos: 1 %
EOS (ABSOLUTE): 0.1 10*3/uL (ref 0.0–0.4)
Eos: 2 %
Hematocrit: 34 % (ref 34.0–46.6)
Hemoglobin: 10.4 g/dL — ABNORMAL LOW (ref 11.1–15.9)
Immature Grans (Abs): 0 10*3/uL (ref 0.0–0.1)
Immature Granulocytes: 0 %
Lymphocytes Absolute: 0.8 10*3/uL (ref 0.7–3.1)
Lymphs: 25 %
MCH: 25 pg — ABNORMAL LOW (ref 26.6–33.0)
MCHC: 30.6 g/dL — ABNORMAL LOW (ref 31.5–35.7)
MCV: 82 fL (ref 79–97)
Monocytes Absolute: 0.4 10*3/uL (ref 0.1–0.9)
Monocytes: 12 %
Neutrophils Absolute: 2 10*3/uL (ref 1.4–7.0)
Neutrophils: 60 %
Platelets: 252 10*3/uL (ref 150–450)
RBC: 4.16 x10E6/uL (ref 3.77–5.28)
RDW: 13 % (ref 11.7–15.4)
WBC: 3.4 10*3/uL (ref 3.4–10.8)

## 2020-10-30 LAB — COMPREHENSIVE METABOLIC PANEL
ALT: 16 IU/L (ref 0–32)
AST: 14 IU/L (ref 0–40)
Albumin/Globulin Ratio: 2.4 — ABNORMAL HIGH (ref 1.2–2.2)
Albumin: 4.5 g/dL (ref 3.8–4.8)
Alkaline Phosphatase: 99 IU/L (ref 44–121)
BUN/Creatinine Ratio: 29 — ABNORMAL HIGH (ref 12–28)
BUN: 23 mg/dL (ref 8–27)
Bilirubin Total: <0.2 mg/dL (ref 0.0–1.2)
CO2: 24 mmol/L (ref 20–29)
Calcium: 9.4 mg/dL (ref 8.7–10.3)
Chloride: 100 mmol/L (ref 96–106)
Creatinine, Ser: 0.8 mg/dL (ref 0.57–1.00)
Globulin, Total: 1.9 g/dL (ref 1.5–4.5)
Glucose: 97 mg/dL (ref 65–99)
Potassium: 4.1 mmol/L (ref 3.5–5.2)
Sodium: 142 mmol/L (ref 134–144)
Total Protein: 6.4 g/dL (ref 6.0–8.5)
eGFR: 80 mL/min/{1.73_m2} (ref >59)

## 2020-10-30 LAB — CARBAMAZEPINE LEVEL, TOTAL: Carbamazepine (Tegretol), S: 9.2 ug/mL (ref 4.0–12.0)

## 2020-10-30 NOTE — Progress Notes (Signed)
I have read the note, and I agree with the clinical assessment and plan.  Ekin Pilar K Ayden Hardwick   

## 2020-11-02 ENCOUNTER — Encounter: Payer: Self-pay | Admitting: Neurology

## 2020-11-04 ENCOUNTER — Telehealth: Payer: Self-pay

## 2020-11-04 ENCOUNTER — Telehealth: Payer: Self-pay | Admitting: Emergency Medicine

## 2020-11-04 NOTE — Telephone Encounter (Signed)
Patient is asymptomatic with a normal colonoscopy done in 2017.  Watch for GI bleed in the form of rectal bleeding or black stools.  May take over-the-counter multivitamins with iron.  We will address during OV on 01/26/2021 unless clinical picture changes in which case I will see her before then.  Avoid NSAIDs such as Aleve, ibuprofen, Advil, or Motrin.  Thanks.

## 2020-11-04 NOTE — Telephone Encounter (Signed)
I spoke to the patient and provided her with the lab results. She is agreeable to follow up with her PCP. States [employer] left her a message and she will call them back.

## 2020-11-04 NOTE — Telephone Encounter (Signed)
-----   Message from Glean Salvo, NP sent at 10/30/2020  8:35 AM EDT ----- Please call, mildly low HGB 10.4, lower than previous, ensure no blood loss, should monitor for this. Rest of labs show no significant abnormalities. Keep routine follow-up with PCP.  Please continue with current medications for seizure prevention. I called her yesterday and let her know a rep from [employer] should be reaching out about her cracked humidifier chamber.

## 2020-11-04 NOTE — Telephone Encounter (Signed)
   Patient called and said that she had lab work done at her neurologist. She said that they told her hemoglobin was low and recommended that she see her PCP before her next appointment. She can be reached at [phone]. Please advise

## 2020-11-04 NOTE — Telephone Encounter (Signed)
See note in Epic. The lab results have been discussed over the phone with patient.

## 2020-11-04 NOTE — Telephone Encounter (Signed)
Attempted to call pt, LVM for call back  °

## 2020-11-04 NOTE — Telephone Encounter (Signed)
Pt returned call. Please call

## 2020-11-05 NOTE — Telephone Encounter (Signed)
Called and spoke with pt, she verbalized understanding. 

## 2020-11-10 ENCOUNTER — Other Ambulatory Visit: Payer: Self-pay | Admitting: Emergency Medicine

## 2020-11-10 DIAGNOSIS — E785 Hyperlipidemia, unspecified: Secondary | ICD-10-CM

## 2020-12-18 ENCOUNTER — Other Ambulatory Visit: Payer: Self-pay | Admitting: Emergency Medicine

## 2020-12-24 DIAGNOSIS — G4733 Obstructive sleep apnea (adult) (pediatric): Secondary | ICD-10-CM | POA: Diagnosis not present

## 2021-01-22 ENCOUNTER — Other Ambulatory Visit: Payer: Self-pay | Admitting: Emergency Medicine

## 2021-01-22 DIAGNOSIS — I10 Essential (primary) hypertension: Secondary | ICD-10-CM

## 2021-01-26 ENCOUNTER — Ambulatory Visit: Payer: Medicare Other | Admitting: Emergency Medicine

## 2021-01-26 ENCOUNTER — Ambulatory Visit (INDEPENDENT_AMBULATORY_CARE_PROVIDER_SITE_OTHER): Payer: Medicare Other | Admitting: Emergency Medicine

## 2021-01-26 ENCOUNTER — Encounter: Payer: Self-pay | Admitting: Emergency Medicine

## 2021-01-26 ENCOUNTER — Other Ambulatory Visit: Payer: Self-pay

## 2021-01-26 VITALS — BP 122/68 | HR 85 | Temp 98.7°F | Ht 65.0 in | Wt 235.0 lb

## 2021-01-26 DIAGNOSIS — I10 Essential (primary) hypertension: Secondary | ICD-10-CM

## 2021-01-26 DIAGNOSIS — G40909 Epilepsy, unspecified, not intractable, without status epilepticus: Secondary | ICD-10-CM | POA: Diagnosis not present

## 2021-01-26 DIAGNOSIS — G4733 Obstructive sleep apnea (adult) (pediatric): Secondary | ICD-10-CM | POA: Diagnosis not present

## 2021-01-26 DIAGNOSIS — F329 Major depressive disorder, single episode, unspecified: Secondary | ICD-10-CM

## 2021-01-26 DIAGNOSIS — E785 Hyperlipidemia, unspecified: Secondary | ICD-10-CM

## 2021-01-26 DIAGNOSIS — R7303 Prediabetes: Secondary | ICD-10-CM

## 2021-01-26 DIAGNOSIS — F411 Generalized anxiety disorder: Secondary | ICD-10-CM

## 2021-01-26 LAB — CBC WITH DIFFERENTIAL/PLATELET
Basophils Absolute: 0 10*3/uL (ref 0.0–0.1)
Basophils Relative: 0.4 % (ref 0.0–3.0)
Eosinophils Absolute: 0.1 10*3/uL (ref 0.0–0.7)
Eosinophils Relative: 2.8 % (ref 0.0–5.0)
HCT: 33.8 % — ABNORMAL LOW (ref 36.0–46.0)
Hemoglobin: 10.8 g/dL — ABNORMAL LOW (ref 12.0–15.0)
Lymphocytes Relative: 31.1 % (ref 12.0–46.0)
Lymphs Abs: 0.9 10*3/uL (ref 0.7–4.0)
MCHC: 31.9 g/dL (ref 30.0–36.0)
MCV: 80.3 fl (ref 78.0–100.0)
Monocytes Absolute: 0.3 10*3/uL (ref 0.1–1.0)
Monocytes Relative: 10.7 % (ref 3.0–12.0)
Neutro Abs: 1.7 10*3/uL (ref 1.4–7.7)
Neutrophils Relative %: 55 % (ref 43.0–77.0)
Platelets: 227 10*3/uL (ref 150.0–400.0)
RBC: 4.21 Mil/uL (ref 3.87–5.11)
RDW: 13 % (ref 11.5–15.5)
WBC: 3 10*3/uL — ABNORMAL LOW (ref 4.0–10.5)

## 2021-01-26 LAB — COMPREHENSIVE METABOLIC PANEL
ALT: 9 U/L (ref 0–35)
AST: 11 U/L (ref 0–37)
Albumin: 4.2 g/dL (ref 3.5–5.2)
Alkaline Phosphatase: 90 U/L (ref 39–117)
BUN: 22 mg/dL (ref 6–23)
CO2: 32 mEq/L (ref 19–32)
Calcium: 9.6 mg/dL (ref 8.4–10.5)
Chloride: 102 mEq/L (ref 96–112)
Creatinine, Ser: 0.86 mg/dL (ref 0.40–1.20)
GFR: 68.72 mL/min (ref >60.00)
Glucose, Bld: 93 mg/dL (ref 70–99)
Potassium: 4 mEq/L (ref 3.5–5.1)
Sodium: 142 mEq/L (ref 135–145)
Total Bilirubin: 0.3 mg/dL (ref 0.2–1.2)
Total Protein: 6.7 g/dL (ref 6.0–8.3)

## 2021-01-26 LAB — LIPID PANEL
Cholesterol: 194 mg/dL (ref 0–200)
HDL: 73.2 mg/dL (ref >39.00)
LDL Cholesterol: 99 mg/dL (ref 0–99)
NonHDL: 120.37
Total CHOL/HDL Ratio: 3
Triglycerides: 106 mg/dL (ref 0.0–149.0)
VLDL: 21.2 mg/dL (ref 0.0–40.0)

## 2021-01-26 LAB — HEMOGLOBIN A1C: Hgb A1c MFr Bld: 6.4 % (ref 4.6–6.5)

## 2021-01-26 NOTE — Assessment & Plan Note (Signed)
Stable.  On Lexapro 10 mg daily.

## 2021-01-26 NOTE — Assessment & Plan Note (Signed)
Stable.  On CPAP treatment. 

## 2021-01-26 NOTE — Assessment & Plan Note (Signed)
Diet and nutrition discussed.  Continue rosuvastatin 20 mg daily. 

## 2021-01-26 NOTE — Assessment & Plan Note (Signed)
NovemberStable.  Last seizure episode 2021.  On Keppra and Tegretol.  Sees neurologist on a regular basis.

## 2021-01-26 NOTE — Assessment & Plan Note (Signed)
Stable.  Last hemoglobin A1c of 5.5 during home nurse visit last July 26. Diet and nutrition discussed.  Advised to decrease amount of daily carbohydrate intake.  Continue metformin 500 mg twice a day.

## 2021-01-26 NOTE — Assessment & Plan Note (Signed)
Well-controlled hypertension BP Readings from Last 3 Encounters:  01/26/21 122/68  10/29/20 106/66  07/21/20 (!) 112/57  Continue Zestoretic 20-25 mg daily and carvedilol 3.125 mg twice daily. Continue daily baby aspirin.

## 2021-01-26 NOTE — Progress Notes (Signed)
[name] [name] 70 y.o.   Chief Complaint  Patient presents with   Hypertension    6 month F/U on BP.    HISTORY OF PRESENT ILLNESS: This is a 70 y.o. female with history of hypertension here for follow-up. Doing well.  Has no complaints or major medical concerns today. #1 hypertension on Zestoretic 20-25 mg daily.  Also on carvedilol 3.125 mg twice a day. #2 prediabetes: On metformin 500 mg twice a day. #3 dyslipidemia: On rosuvastatin 20 mg daily. Takes 1 baby daily aspirin #4 history of seizure disorder and memory deficits.  Presently on Keppra and Tegretol #5 history of depression on Lexapro 10 mg daily.  Stable. #6 obstructive sleep apnea on CPAP treatment.  Stable.  Hypertension Pertinent negatives include no chest pain, headaches, palpitations or shortness of breath.    Prior to Admission medications   Medication Sig Start Date End Date Taking? Authorizing Provider  aspirin 81 MG chewable tablet Chew 81 mg by mouth daily.   Yes [provider]  carbamazepine (TEGRETOL) 200 MG tablet Take 1.5 tablets twice daily 10/29/20  Yes Glean Salvo, NP  carvedilol (COREG) 3.125 MG tablet TAKE 1 TABLET(3.125 MG) BY MOUTH TWICE DAILY WITH A MEAL 01/23/21  Yes Marguita Venning, Eilleen Kempf, MD  cholecalciferol (VITAMIN D3) 25 MCG (1000 UT) tablet Take 1,000 Units by mouth daily.   Yes [provider]  escitalopram (LEXAPRO) 10 MG tablet TAKE 1 TABLET(10 MG) BY MOUTH AT BEDTIME 12/18/20  Yes Darrion Wyszynski, Eilleen Kempf, MD  levETIRAcetam (KEPPRA) 750 MG tablet TAKE 2 TABLETS(1500 MG) BY MOUTH TWICE DAILY 10/29/20  Yes Glean Salvo, NP  lisinopril-hydrochlorothiazide (ZESTORETIC) 20-25 MG tablet TAKE 1 TABLET BY MOUTH DAILY 12/18/20  Yes Georgina Quint, MD  metFORMIN (GLUCOPHAGE) 500 MG tablet Take 1 tablet (500 mg total) by mouth 2 (two) times daily with a meal. 07/22/20  Yes Krystina Strieter, Eilleen Kempf, MD  Multiple Vitamins-Minerals (MULTIVITAMIN PO) Take 1 tablet by mouth daily.    Yes [provider]  rosuvastatin (CRESTOR) 20 MG tablet TAKE 1 TABLET(20 MG) BY MOUTH DAILY 11/10/20  Yes Georgina Quint, MD    No Known Allergies  Patient Active Problem List   Diagnosis Date Noted   Seizure disorder (HCC) 05/28/2019   Prediabetes 05/28/2019   Staring episodes 11/24/2017   OSA (obstructive sleep apnea) 11/30/2016   Chronic insomnia 10/13/2016   Transient cerebral ischemia 08/26/2016   Transient memory loss 08/26/2016   Morbid obesity (HCC) 05/31/2016   Glucose intolerance (impaired glucose tolerance) 05/18/2016   Pronation deformity of ankle, acquired 04/19/2014   Generalized anxiety disorder 03/15/2014   Memory difficulty 03/14/2014   Demand myocardial ischemic related infarction 01/23/2014    Class: Diagnosis of   Depression, reactive 10/12/2012   Chronic neutropenia (HCC) 04/14/2012   Hyperlipemia 10/20/2011   HTN (hypertension) 10/20/2011    Past Medical History:  Diagnosis Date   Anxiety    Cataract    Chronic insomnia 10/13/2016   Depression    Gait disorder 03/14/2014   HTN (hypertension)    Hx of cardiovascular stress test    Lexiscan Myoview (1/16):  No ischemia, EF 68%, breast atten; Low Risk   Hyperlipemia    Hyperlipidemia    Phreesia 07/18/2020   Hypertension    [employer]reesia 07/18/2020   Memory difficulty 03/14/2014   Meningitis due to bacteria 01-18-14   OSA (obstructive sleep apnea) 11/30/2016   Seizures (HCC)    Sleep apnea    [employer]reesia 07/18/2020  TIA (transient ischemic attack)     Past Surgical History:  Procedure Laterality Date   NO PAST SURGERIES      Social History   Socioeconomic History   Marital status: Divorced    Spouse name: Not on file   Number of children: 1   Years of education: college   Highest education level: Some college, no degree  Occupational History   Occupation: Retired Financial planner: [employer]  Tobacco Use   Smoking status: Former    Packs/day: 0.50    Years: 40.00    Pack years:  20.00    Types: Cigarettes    Quit date: 07/09/2011    Years since quitting: 9.5   Smokeless tobacco: Never  Vaping Use   Vaping Use: Never used  Substance and Sexual Activity   Alcohol use: Yes    Alcohol/week: 0.0 standard drinks    Comment: Rare   Drug use: No   Sexual activity: Never    Birth control/protection: Abstinence  Other Topics Concern   Not on file  Social History Narrative   Marital status: divorced x 35 years; not dating; not interested in 2019.      Children: 1 child/son (44); 2 grandchildren; [place]      Lives:  Alone in [place] in [place].      Employment: retired 2008; Administrator, arts.      Tobacco: quit smoking in 2013.  Smoked x many years       Alcohol: none; holidays only      Exercise: none; quit exercising two years ago in 2015.        Seatbelt: 100%; no texting. Does not drive in [address] due to seizure activity.       ADLs: no driving in 3235[address]32 due to seizure activity; independent with ADLs.  Has cane for long distances      Advanced Directives: none; [name]/sister.  FULL CODE no prolonged measures.         Patient is single lives at home alone, has 1 child   Patient is right handed   Education level is 1 year of college   Caffeine consumption is 2 cups daily   Social Determinants of Health   Financial Resource Strain: Not on file  Food Insecurity: Not on file  Transportation Needs: Not on file  Physical Activity: Not on file  Stress: Not on file  Social Connections: Not on file  Intimate Partner Violence: Not on file    Family History  Problem Relation Age of Onset   Heart attack Mother 59   Stroke Mother    Hypertension Mother    Heart attack Father 37   Cerebral aneurysm Sister    Hypertension Sister    Kidney disease Sister        ESRD; HD in last year   Hypertension Brother    Hyperlipidemia Brother    Rheum arthritis Sister    Arthritis Sister    Hypertension Brother    Hyperlipidemia Brother     Hyperlipidemia Brother    Hypertension Brother      Review of Systems  Constitutional: Negative.  Negative for chills and fever.  HENT: Negative.  Negative for congestion and sore throat.   Respiratory: Negative.  Negative for cough and shortness of breath.   Cardiovascular: Negative.  Negative for chest pain and palpitations.  Gastrointestinal: Negative.  Negative for abdominal pain, diarrhea, nausea and vomiting.  Genitourinary: Negative.   Musculoskeletal: Negative.   Skin:  Negative.  Negative for rash.  Neurological:  Negative for dizziness and headaches.       Memory problems  All other systems reviewed and are negative.  Today's Vitals   01/26/21 1041  BP: 122/68  Pulse: 85  Temp: 98.7 F (37.1 C)  TempSrc: Oral  SpO2: 96%  Weight: 235 lb (106.6 kg)  Height: 5\' 5"  (1.651 m)   Body mass index is 39.11 kg/m.  Physical Exam Vitals reviewed.  HENT:     Head: Normocephalic.  Eyes:     Extraocular Movements: Extraocular movements intact.     Conjunctiva/sclera: Conjunctivae normal.     Pupils: Pupils are equal, round, and reactive to light.  Cardiovascular:     Rate and Rhythm: Normal rate and regular rhythm.     Pulses: Normal pulses.     Heart sounds: Normal heart sounds.  Pulmonary:     Effort: Pulmonary effort is normal.     Breath sounds: Normal breath sounds.  Abdominal:     General: Bowel sounds are normal. There is no distension.     Palpations: Abdomen is soft.     Tenderness: There is no abdominal tenderness.  Musculoskeletal:        General: Normal range of motion.     Cervical back: Normal range of motion and neck supple.  Skin:    General: Skin is warm and dry.     Capillary Refill: Capillary refill takes less than 2 seconds.  Neurological:     General: No focal deficit present.     Mental Status: She is alert and oriented to person, place, and time.  Psychiatric:        Mood and Affect: Mood normal.        Behavior: Behavior normal.      ASSESSMENT & PLAN: HTN (hypertension) Well-controlled hypertension BP Readings from Last 3 Encounters:  01/26/21 122/68  10/29/20 106/66  07/21/20 (!) 112/57  Continue Zestoretic 20-25 mg daily and carvedilol 3.125 mg twice daily. Continue daily baby aspirin.   OSA (obstructive sleep apnea) Stable.  On CPAP treatment.  Seizure disorder (HCC) NovemberStable.  Last seizure episode 2021.  On Keppra and Tegretol.  Sees neurologist on a regular basis.  Depression, reactive Stable.  On Lexapro 10 mg daily.  Dyslipidemia Diet and nutrition discussed.  Continue rosuvastatin 20 mg daily.   Prediabetes Stable.  Last hemoglobin A1c of 5.5 during home nurse visit last July 26. Diet and nutrition discussed.  Advised to decrease amount of daily carbohydrate intake.  Continue metformin 500 mg twice a day.  Kailynne was seen today for hypertension.  Diagnoses and all orders for this visit:  Essential hypertension -     CBC with Differential/Platelet -     Comprehensive metabolic panel  Seizure disorder (HCC)  Prediabetes -     CBC with Differential/Platelet -     Hemoglobin A1c  OSA (obstructive sleep apnea)  Generalized anxiety disorder  Dyslipidemia -     CBC with Differential/Platelet -     Comprehensive metabolic panel -     Lipid panel  Depression, reactive  Patient Instructions  Health Maintenance After Age 66 After age 62, you are at a higher risk for certain long-term diseases and infections as well as injuries from falls. Falls are a major cause of broken bones and head injuries in people who are older than age 74. Getting regular preventive care can help to keep you healthy and well. Preventive care includes getting regular testing and  making lifestyle changes as recommended by your health care provider. Talk with your health care provider about: Which screenings and tests you should have. A screening is a test that checks for a disease when you have no  symptoms. A diet and exercise plan that is right for you. What should I know about screenings and tests to prevent falls? Screening and testing are the best ways to find a health problem early. Early diagnosis and treatment give you the best chance of managing medical conditions that are common after age 51. Certain conditions and lifestyle choices may make you more likely to have a fall. Your health care provider may recommend: Regular vision checks. Poor vision and conditions such as cataracts can make you more likely to have a fall. If you wear glasses, make sure to get your prescription updated if your vision changes. Medicine review. Work with your health care provider to regularly review all of the medicines you are taking, including over-the-counter medicines. Ask your health care provider about any side effects that may make you more likely to have a fall. Tell your health care provider if any medicines that you take make you feel dizzy or sleepy. Osteoporosis screening. Osteoporosis is a condition that causes the bones to get weaker. This can make the bones weak and cause them to break more easily. Blood pressure screening. Blood pressure changes and medicines to control blood pressure can make you feel dizzy. Strength and balance checks. Your health care provider may recommend certain tests to check your strength and balance while standing, walking, or changing positions. Foot health exam. Foot pain and numbness, as well as not wearing proper footwear, can make you more likely to have a fall. Depression screening. You may be more likely to have a fall if you have a fear of falling, feel emotionally low, or feel unable to do activities that you used to do. Alcohol use screening. Using too much alcohol can affect your balance and may make you more likely to have a fall. What actions can I take to lower my risk of falls? General instructions Talk with your health care provider about your risks  for falling. Tell your health care provider if: You fall. Be sure to tell your health care provider about all falls, even ones that seem minor. You feel dizzy, sleepy, or off-balance. Take over-the-counter and prescription medicines only as told by your health care provider. These include any supplements. Eat a healthy diet and maintain a healthy weight. A healthy diet includes low-fat dairy products, low-fat (lean) meats, and fiber from whole grains, beans, and lots of fruits and vegetables. Home safety Remove any tripping hazards, such as rugs, cords, and clutter. Install safety equipment such as grab bars in bathrooms and safety rails on stairs. Keep rooms and walkways well-lit. Activity  Follow a regular exercise program to stay fit. This will help you maintain your balance. Ask your health care provider what types of exercise are appropriate for you. If you need a cane or walker, use it as recommended by your health care provider. Wear supportive shoes that have nonskid soles. Lifestyle Do not drink alcohol if your health care provider tells you not to drink. If you drink alcohol, limit how much you have: 0-1 drink a day for women. 0-2 drinks a day for men. Be aware of how much alcohol is in your drink. In the [place] one drink equals one typical bottle of beer (12 oz), one-half glass of wine (5 oz), or  one shot of hard liquor (1 oz). Do not use any products that contain nicotine or tobacco, such as cigarettes and e-cigarettes. If you need help quitting, ask your health care provider. Summary Having a healthy lifestyle and getting preventive care can help to protect your health and wellness after age 32. Screening and testing are the best way to find a health problem early and help you avoid having a fall. Early diagnosis and treatment give you the best chance for managing medical conditions that are more common for people who are older than age 76. Falls are a major cause of broken bones  and head injuries in people who are older than age 71. Take precautions to prevent a fall at home. Work with your health care provider to learn what changes you can make to improve your health and wellness and to prevent falls. This information is not intended to replace advice given to you by your health care provider. Make sure you discuss any questions you have with your health care provider. Document Revised: 06/27/2020 Document Reviewed: 04/04/2020 Elsevier Patient Education  2022 Elsevier Inc.    Edwina Barth, MD [facility]

## 2021-01-26 NOTE — Patient Instructions (Signed)
Health Maintenance After Age 70 After age 70, you are at a higher risk for certain long-term diseases and infections as well as injuries from falls. Falls are a major cause of broken bones and head injuries in people who are older than age 70. Getting regular preventive care can help to keep you healthy and well. Preventive care includes getting regular testing and making lifestyle changes as recommended by your health care provider. Talk with your health care provider about: Which screenings and tests you should have. A screening is a test that checks for a disease when you have no symptoms. A diet and exercise plan that is right for you. What should I know about screenings and tests to prevent falls? Screening and testing are the best ways to find a health problem early. Early diagnosis and treatment give you the best chance of managing medical conditions that are common after age 70. Certain conditions and lifestyle choices may make you more likely to have a fall. Your health care provider may recommend: Regular vision checks. Poor vision and conditions such as cataracts can make you more likely to have a fall. If you wear glasses, make sure to get your prescription updated if your vision changes. Medicine review. Work with your health care provider to regularly review all of the medicines you are taking, including over-the-counter medicines. Ask your health care provider about any side effects that may make you more likely to have a fall. Tell your health care provider if any medicines that you take make you feel dizzy or sleepy. Osteoporosis screening. Osteoporosis is a condition that causes the bones to get weaker. This can make the bones weak and cause them to break more easily. Blood pressure screening. Blood pressure changes and medicines to control blood pressure can make you feel dizzy. Strength and balance checks. Your health care provider may recommend certain tests to check your strength and  balance while standing, walking, or changing positions. Foot health exam. Foot pain and numbness, as well as not wearing proper footwear, can make you more likely to have a fall. Depression screening. You may be more likely to have a fall if you have a fear of falling, feel emotionally low, or feel unable to do activities that you used to do. Alcohol use screening. Using too much alcohol can affect your balance and may make you more likely to have a fall. What actions can I take to lower my risk of falls? General instructions Talk with your health care provider about your risks for falling. Tell your health care provider if: You fall. Be sure to tell your health care provider about all falls, even ones that seem minor. You feel dizzy, sleepy, or off-balance. Take over-the-counter and prescription medicines only as told by your health care provider. These include any supplements. Eat a healthy diet and maintain a healthy weight. A healthy diet includes low-fat dairy products, low-fat (lean) meats, and fiber from whole grains, beans, and lots of fruits and vegetables. Home safety Remove any tripping hazards, such as rugs, cords, and clutter. Install safety equipment such as grab bars in bathrooms and safety rails on stairs. Keep rooms and walkways well-lit. Activity  Follow a regular exercise program to stay fit. This will help you maintain your balance. Ask your health care provider what types of exercise are appropriate for you. If you need a cane or walker, use it as recommended by your health care provider. Wear supportive shoes that have nonskid soles. Lifestyle Do not   drink alcohol if your health care provider tells you not to drink. If you drink alcohol, limit how much you have: 0-1 drink a day for women. 0-2 drinks a day for men. Be aware of how much alcohol is in your drink. In the [place] one drink equals one typical bottle of beer (12 oz), one-half glass of wine (5 oz), or one shot of  hard liquor (1 oz). Do not use any products that contain nicotine or tobacco, such as cigarettes and e-cigarettes. If you need help quitting, ask your health care provider. Summary Having a healthy lifestyle and getting preventive care can help to protect your health and wellness after age 70. Screening and testing are the best way to find a health problem early and help you avoid having a fall. Early diagnosis and treatment give you the best chance for managing medical conditions that are more common for people who are older than age 70. Falls are a major cause of broken bones and head injuries in people who are older than age 70. Take precautions to prevent a fall at home. Work with your health care provider to learn what changes you can make to improve your health and wellness and to prevent falls. This information is not intended to replace advice given to you by your health care provider. Make sure you discuss any questions you have with your health care provider. Document Revised: 06/27/2020 Document Reviewed: 04/04/2020 Elsevier Patient Education  2022 Elsevier Inc.  

## 2021-03-18 ENCOUNTER — Other Ambulatory Visit: Payer: Self-pay | Admitting: Emergency Medicine

## 2021-03-29 DIAGNOSIS — G4733 Obstructive sleep apnea (adult) (pediatric): Secondary | ICD-10-CM | POA: Diagnosis not present

## 2021-04-21 ENCOUNTER — Other Ambulatory Visit: Payer: Self-pay | Admitting: Emergency Medicine

## 2021-04-21 DIAGNOSIS — I10 Essential (primary) hypertension: Secondary | ICD-10-CM

## 2021-06-29 DIAGNOSIS — G4733 Obstructive sleep apnea (adult) (pediatric): Secondary | ICD-10-CM | POA: Diagnosis not present

## 2021-07-12 ENCOUNTER — Other Ambulatory Visit: Payer: Self-pay | Admitting: Emergency Medicine

## 2021-07-12 DIAGNOSIS — E1165 Type 2 diabetes mellitus with hyperglycemia: Secondary | ICD-10-CM

## 2021-07-16 ENCOUNTER — Other Ambulatory Visit: Payer: Self-pay | Admitting: Emergency Medicine

## 2021-07-28 ENCOUNTER — Encounter: Payer: Self-pay | Admitting: Emergency Medicine

## 2021-07-28 ENCOUNTER — Ambulatory Visit (INDEPENDENT_AMBULATORY_CARE_PROVIDER_SITE_OTHER): Payer: Medicare Other | Admitting: Emergency Medicine

## 2021-07-28 ENCOUNTER — Ambulatory Visit (INDEPENDENT_AMBULATORY_CARE_PROVIDER_SITE_OTHER): Payer: Medicare Other

## 2021-07-28 VITALS — BP 110/68 | HR 60 | Temp 98.2°F | Ht 65.0 in | Wt 238.0 lb

## 2021-07-28 DIAGNOSIS — R7303 Prediabetes: Secondary | ICD-10-CM

## 2021-07-28 DIAGNOSIS — E785 Hyperlipidemia, unspecified: Secondary | ICD-10-CM | POA: Diagnosis not present

## 2021-07-28 DIAGNOSIS — M25551 Pain in right hip: Secondary | ICD-10-CM | POA: Diagnosis not present

## 2021-07-28 DIAGNOSIS — Z0000 Encounter for general adult medical examination without abnormal findings: Secondary | ICD-10-CM | POA: Diagnosis not present

## 2021-07-28 DIAGNOSIS — I10 Essential (primary) hypertension: Secondary | ICD-10-CM | POA: Diagnosis not present

## 2021-07-28 DIAGNOSIS — G40909 Epilepsy, unspecified, not intractable, without status epilepticus: Secondary | ICD-10-CM

## 2021-07-28 LAB — CBC WITH DIFFERENTIAL/PLATELET
Basophils Absolute: 0 10*3/uL (ref 0.0–0.1)
Basophils Relative: 0.6 % (ref 0.0–3.0)
Eosinophils Absolute: 0.1 10*3/uL (ref 0.0–0.7)
Eosinophils Relative: 3.7 % (ref 0.0–5.0)
HCT: 32.4 % — ABNORMAL LOW (ref 36.0–46.0)
Hemoglobin: 10.7 g/dL — ABNORMAL LOW (ref 12.0–15.0)
Lymphocytes Relative: 23.9 % (ref 12.0–46.0)
Lymphs Abs: 0.8 10*3/uL (ref 0.7–4.0)
MCHC: 32.9 g/dL (ref 30.0–36.0)
MCV: 79.5 fl (ref 78.0–100.0)
Monocytes Absolute: 0.4 10*3/uL (ref 0.1–1.0)
Monocytes Relative: 11.4 % (ref 3.0–12.0)
Neutro Abs: 2 10*3/uL (ref 1.4–7.7)
Neutrophils Relative %: 60.4 % (ref 43.0–77.0)
Platelets: 211 10*3/uL (ref 150.0–400.0)
RBC: 4.08 Mil/uL (ref 3.87–5.11)
RDW: 13.2 % (ref 11.5–15.5)
WBC: 3.3 10*3/uL — ABNORMAL LOW (ref 4.0–10.5)

## 2021-07-28 LAB — LIPID PANEL
Cholesterol: 189 mg/dL (ref 0–200)
HDL: 73.9 mg/dL (ref >39.00)
LDL Cholesterol: 97 mg/dL (ref 0–99)
NonHDL: 115.12
Total CHOL/HDL Ratio: 3
Triglycerides: 91 mg/dL (ref 0.0–149.0)
VLDL: 18.2 mg/dL (ref 0.0–40.0)

## 2021-07-28 LAB — COMPREHENSIVE METABOLIC PANEL
ALT: 11 U/L (ref 0–35)
AST: 13 U/L (ref 0–37)
Albumin: 4.1 g/dL (ref 3.5–5.2)
Alkaline Phosphatase: 91 U/L (ref 39–117)
BUN: 27 mg/dL — ABNORMAL HIGH (ref 6–23)
CO2: 32 mEq/L (ref 19–32)
Calcium: 9.4 mg/dL (ref 8.4–10.5)
Chloride: 102 mEq/L (ref 96–112)
Creatinine, Ser: 0.88 mg/dL (ref 0.40–1.20)
GFR: 66.62 mL/min (ref >60.00)
Glucose, Bld: 98 mg/dL (ref 70–99)
Potassium: 4.1 mEq/L (ref 3.5–5.1)
Sodium: 140 mEq/L (ref 135–145)
Total Bilirubin: 0.3 mg/dL (ref 0.2–1.2)
Total Protein: 6.6 g/dL (ref 6.0–8.3)

## 2021-07-28 LAB — HEMOGLOBIN A1C: Hgb A1c MFr Bld: 6.5 % (ref 4.6–6.5)

## 2021-07-28 NOTE — Patient Instructions (Signed)
Health Maintenance After Age 71 After age 71, you are at a higher risk for certain long-term diseases and infections as well as injuries from falls. Falls are a major cause of broken bones and head injuries in people who are older than age 71. Getting regular preventive care can help to keep you healthy and well. Preventive care includes getting regular testing and making lifestyle changes as recommended by your health care provider. Talk with your health care provider about: Which screenings and tests you should have. A screening is a test that checks for a disease when you have no symptoms. A diet and exercise plan that is right for you. What should I know about screenings and tests to prevent falls? Screening and testing are the best ways to find a health problem early. Early diagnosis and treatment give you the best chance of managing medical conditions that are common after age 71. Certain conditions and lifestyle choices may make you more likely to have a fall. Your health care provider may recommend: Regular vision checks. Poor vision and conditions such as cataracts can make you more likely to have a fall. If you wear glasses, make sure to get your prescription updated if your vision changes. Medicine review. Work with your health care provider to regularly review all of the medicines you are taking, including over-the-counter medicines. Ask your health care provider about any side effects that may make you more likely to have a fall. Tell your health care provider if any medicines that you take make you feel dizzy or sleepy. Strength and balance checks. Your health care provider may recommend certain tests to check your strength and balance while standing, walking, or changing positions. Foot health exam. Foot pain and numbness, as well as not wearing proper footwear, can make you more likely to have a fall. Screenings, including: Osteoporosis screening. Osteoporosis is a condition that causes  the bones to get weaker and break more easily. Blood pressure screening. Blood pressure changes and medicines to control blood pressure can make you feel dizzy. Depression screening. You may be more likely to have a fall if you have a fear of falling, feel depressed, or feel unable to do activities that you used to do. Alcohol use screening. Using too much alcohol can affect your balance and may make you more likely to have a fall. Follow these instructions at home: Lifestyle Do not drink alcohol if: Your health care provider tells you not to drink. If you drink alcohol: Limit how much you have to: 0-1 drink a day for women. 0-2 drinks a day for men. Know how much alcohol is in your drink. In the [place] one drink equals one 12 oz bottle of beer (355 mL), one 5 oz glass of wine (148 mL), or one 1 oz glass of hard liquor (44 mL). Do not use any products that contain nicotine or tobacco. These products include cigarettes, chewing tobacco, and vaping devices, such as e-cigarettes. If you need help quitting, ask your health care provider. Activity  Follow a regular exercise program to stay fit. This will help you maintain your balance. Ask your health care provider what types of exercise are appropriate for you. If you need a cane or walker, use it as recommended by your health care provider. Wear supportive shoes that have nonskid soles. Safety  Remove any tripping hazards, such as rugs, cords, and clutter. Install safety equipment such as grab bars in bathrooms and safety rails on stairs. Keep rooms and walkways   well-lit. General instructions Talk with your health care provider about your risks for falling. Tell your health care provider if: You fall. Be sure to tell your health care provider about all falls, even ones that seem minor. You feel dizzy, tiredness (fatigue), or off-balance. Take over-the-counter and prescription medicines only as told by your health care provider. These include  supplements. Eat a healthy diet and maintain a healthy weight. A healthy diet includes low-fat dairy products, low-fat (lean) meats, and fiber from whole grains, beans, and lots of fruits and vegetables. Stay current with your vaccines. Schedule regular health, dental, and eye exams. Summary Having a healthy lifestyle and getting preventive care can help to protect your health and wellness after age 71. Screening and testing are the best way to find a health problem early and help you avoid having a fall. Early diagnosis and treatment give you the best chance for managing medical conditions that are more common for people who are older than age 71. Falls are a major cause of broken bones and head injuries in people who are older than age 71. Take precautions to prevent a fall at home. Work with your health care provider to learn what changes you can make to improve your health and wellness and to prevent falls. This information is not intended to replace advice given to you by your health care provider. Make sure you discuss any questions you have with your health care provider. Document Revised: 09/08/2020 Document Reviewed: 09/08/2020 Elsevier Patient Education  2022 Elsevier Inc.  

## 2021-07-28 NOTE — Progress Notes (Signed)
[name] [name] ?71 y.o. ? ? ?Chief Complaint  ?Patient presents with  ? Annual Exam  ? Spasms  ?  Right hip radiating to her lower back, comes and goes   ? ? ?HISTORY OF PRESENT ILLNESS: ?This is a 71 y.o. female here for annual exam. ?Has history of seizure disorder, hypertension, prediabetes, and dyslipidemia. ?Doing well. ?Occasionally gets pain to right hip radiating to lower back.  Intermittent pain related to movement.  No other associated symptoms. ?No other complaints or medical concerns today. ? ?HPI ? ? ?Prior to Admission medications   ?Medication Sig Start Date End Date Taking? Authorizing Provider  ?aspirin 81 MG chewable tablet Chew 81 mg by mouth daily.   Yes [provider]  ?carbamazepine (TEGRETOL) 200 MG tablet Take 1.5 tablets twice daily 10/29/20  Yes Glean Salvo, NP  ?carvedilol (COREG) 3.125 MG tablet TAKE 1 TABLET(3.125 MG) BY MOUTH TWICE DAILY WITH A MEAL 07/16/21  Yes Rowe Warman, Eilleen Kempf, MD  ?cholecalciferol (VITAMIN D3) 25 MCG (1000 UT) tablet Take 1,000 Units by mouth daily.   Yes [provider]  ?escitalopram (LEXAPRO) 10 MG tablet TAKE 1 TABLET(10 MG) BY MOUTH AT BEDTIME 03/18/21  Yes Caius Silbernagel, Eilleen Kempf, MD  ?levETIRAcetam (KEPPRA) 750 MG tablet TAKE 2 TABLETS(1500 MG) BY MOUTH TWICE DAILY 10/29/20  Yes Glean Salvo, NP  ?lisinopril-hydrochlorothiazide (ZESTORETIC) 20-25 MG tablet TAKE 1 TABLET BY MOUTH DAILY 03/18/21  Yes Aivan Fillingim, Eilleen Kempf, MD  ?metFORMIN (GLUCOPHAGE) 500 MG tablet TAKE 1 TABLET(500 MG) BY MOUTH TWICE DAILY WITH A MEAL 07/12/21  Yes Tejah Brekke, Eilleen Kempf, MD  ?Multiple Vitamins-Minerals (MULTIVITAMIN PO) Take 1 tablet by mouth daily.   Yes [provider]  ?rosuvastatin (CRESTOR) 20 MG tablet TAKE 1 TABLET(20 MG) BY MOUTH DAILY 11/10/20  Yes Georgina Quint, MD  ? ? ?No Known Allergies ? ?Patient Active Problem List  ? Diagnosis Date Noted  ? Seizure disorder (HCC) 05/28/2019  ? Prediabetes 05/28/2019  ? Staring episodes  11/24/2017  ? OSA (obstructive sleep apnea) 11/30/2016  ? Chronic insomnia 10/13/2016  ? Transient cerebral ischemia 08/26/2016  ? Transient memory loss 08/26/2016  ? Morbid obesity (HCC) 05/31/2016  ? Glucose intolerance (impaired glucose tolerance) 05/18/2016  ? Pronation deformity of ankle, acquired 04/19/2014  ? Generalized anxiety disorder 03/15/2014  ? Memory difficulty 03/14/2014  ? Demand myocardial ischemic related infarction 01/23/2014  ?  Class: Diagnosis of  ? Depression, reactive 10/12/2012  ? Chronic neutropenia (HCC) 04/14/2012  ? Dyslipidemia 10/20/2011  ? HTN (hypertension) 10/20/2011  ? ? ?Past Medical History:  ?Diagnosis Date  ? Anxiety   ? Cataract   ? Chronic insomnia 10/13/2016  ? Depression   ? Gait disorder 03/14/2014  ? HTN (hypertension)   ? Hx of cardiovascular stress test   ? Lexiscan Myoview (1/16):  No ischemia, EF 68%, breast atten; Low Risk  ? Hyperlipemia   ? Hyperlipidemia   ? Phreesia 07/18/2020  ? Hypertension   ? [employer]rees[employer] 07/18/2020  ? Memory difficulty 03/14/2014  ? Meningitis due to bacteria 01-18-14  ? OSA (obstructive sleep apnea) 11/30/2016  ? Seizures (HCC)   ? Sleep apnea   ? [employer]reesia 07/18/2020  ? TIA (transient ischemic attack)   ? ? ?Past Surgical History:  ?Procedure Laterality Date  ? NO PAST SURGERIES    ? ? ?Social History  ? ?Socioeconomic History  ? Marital status: Divorced  ?  Spouse name: Not on file  ? Number of children: 1  ?  Years of education: college  ? Highest education level: Some college, no degree  ?Occupational History  ? Occupation: Retired [employer]  ?  Employer: [employer]  ?Tobacco Use  ? Smoking status: Former  ?  Packs/day: 0.50  ?  Years: 40.00  ?  Pack years: 20.00  ?  Types: Cigarettes  ?  Quit date: 07/09/2011  ?  Years since quitting: 10.0  ? Smokeless tobacco: Never  ?Vaping Use  ? Vaping Use: Never used  ?Substance and Sexual Activity  ? Alcohol use: Yes  ?  Alcohol/week: 0.0 standard drinks  ?  Comment: Rare  ? Drug use: No  ? Sexual activity: Never  ?   Birth control/protection: Abstinence  ?Other Topics Concern  ? Not on file  ?Social History Narrative  ? Marital status: divorced x 35 years; not dating; not interested in 2019.  ?    Children: 1 child/son (44); 2 grandchildren; [place]  ?    Lives:  Alone in townhouse in [place].  ?    Employment: retired 2008; Administrator, [employer].  ?    Tobacco: quit smoking in 2013.  Smoked x many years  ?     Alcohol: none; holidays only  ?    Exercise: none; quit exercising two years ago in 2015.    ?    Seatbelt: 100%; no texting. Does not drive in [address]32 due to seizure activity.  ?     ADLs: no driving in 2025-4270 due to seizure activity; independent with ADLs.  Has cane for long distances  ?    Advanced Directives: none; [name]/sister.  FULL CODE no prolonged measures.  ?   ?   ? Patient is single lives at home alone, has 1 child  ? Patient is right handed  ? Education level is 1 year of college  ? Caffeine consumption is 2 cups daily  ? ?Social Determinants of Health  ? ?Financial Resource Strain: Not on file  ?Food Insecurity: Not on file  ?Transportation Needs: Not on file  ?Physical Activity: Not on file  ?Stress: Not on file  ?Social Connections: Not on file  ?Intimate Partner Violence: Not on file  ? ? ?Family History  ?Problem Relation Age of Onset  ? Heart attack Mother 38  ? Stroke Mother   ? Hypertension Mother   ? Heart attack Father 62  ? Cerebral aneurysm Sister   ? Hypertension Sister   ? Kidney disease Sister   ?     ESRD; HD in last year  ? Hypertension Brother   ? Hyperlipidemia Brother   ? Rheum arthritis Sister   ? Arthritis Sister   ? Hypertension Brother   ? Hyperlipidemia Brother   ? Hyperlipidemia Brother   ? Hypertension Brother   ? ? ? ?Review of Systems  ?Constitutional: Negative.  Negative for chills and fever.  ?HENT: Negative.  Negative for congestion and sore throat.   ?Respiratory: Negative.  Negative for cough and shortness of breath.   ?Cardiovascular: Negative.   Negative for chest pain and palpitations.  ?Gastrointestinal:  Negative for abdominal pain, nausea and vomiting.  ?Genitourinary: Negative.   ?Musculoskeletal:  Positive for back pain. Negative for joint pain.  ?Skin: Negative.  Negative for rash.  ?Neurological:  Negative for dizziness and headaches.  ?All other systems reviewed and are negative. ? ?Today's Vitals  ? 07/28/21 0954  ?BP: 110/68  ?Pulse: 60  ?Temp: 98.2 ?F (36.8 ?C)  ?TempSrc: Oral  ?SpO2: 97%  ?  Weight: 238 lb (108 kg)  ?Height: 5\' 5"  (1.651 m)  ? ?Body mass index is 39.61 kg/m?. ? ?Physical Exam ?Vitals reviewed.  ?Constitutional:   ?   Appearance: Normal appearance.  ?HENT:  ?   Head: Normocephalic.  ?Eyes:  ?   Extraocular Movements: Extraocular movements intact.  ?   Pupils: Pupils are equal, round, and reactive to light.  ?Cardiovascular:  ?   Rate and Rhythm: Normal rate and regular rhythm.  ?   Pulses: Normal pulses.  ?   Heart sounds: Normal heart sounds.  ?Pulmonary:  ?   Effort: Pulmonary effort is normal.  ?   Breath sounds: Normal breath sounds.  ?Musculoskeletal:  ?   Cervical back: No tenderness.  ?   Lumbar back: Tenderness (Right sacral area) present.  ?Lymphadenopathy:  ?   Cervical: No cervical adenopathy.  ?Skin: ?   General: Skin is warm and dry.  ?   Capillary Refill: Capillary refill takes less than 2 seconds.  ?Neurological:  ?   General: No focal deficit present.  ?   Mental Status: She is alert and oriented to person, place, and time.  ?Psychiatric:     ?   Mood and Affect: Mood normal.     ?   Behavior: Behavior normal.  ? ?DG HIP UNILAT W OR W/O PELVIS 2-3 VIEWS RIGHT ? ?Result Date: 07/28/2021 ?CLINICAL DATA:  Right hip pain 2.5 weeks.  No known injury EXAM: DG HIP (WITH OR WITHOUT PELVIS) 2-3V RIGHT COMPARISON:  None. FINDINGS: There is no evidence of hip fracture or dislocation. There is no evidence of arthropathy or other focal bone abnormality. IMPRESSION: Negative. Electronically Signed   By: 07/30/2021 M.D.   On:  07/28/2021 11:10   ? ? ?ASSESSMENT & PLAN: ?Problem List Items Addressed This Visit   ? ?  ? Nervous and Auditory  ? Seizure disorder (HCC)  ?  ? Other  ? Dyslipidemia  ? Relevant Orders  ? CBC with Differen

## 2021-08-11 LAB — HM DIABETES EYE EXAM

## 2021-08-12 ENCOUNTER — Telehealth: Payer: Self-pay

## 2021-08-12 DIAGNOSIS — R2689 Other abnormalities of gait and mobility: Secondary | ICD-10-CM

## 2021-08-12 MED ORDER — CANE MISC: 0 refills | Status: DC

## 2021-08-12 NOTE — Telephone Encounter (Signed)
Notified pt rx sent to [facility]  

## 2021-08-12 NOTE — Telephone Encounter (Signed)
Pt is requesting a new Rx for a stick cane to help with balance. ? ?Pharmacy: ?[id number] - [place], [place] - [address] ? ?Please advise. ?

## 2021-08-12 NOTE — Telephone Encounter (Signed)
Okay to provide prescription for a cane.  Thanks.

## 2021-08-13 NOTE — Telephone Encounter (Signed)
Called pt back need to know which medical supply store. Pt states to cancel rx. So ,much a hassall to get rx. Will purchase once at [facility]. Cancel rx for cane ?

## 2021-08-13 NOTE — Addendum Note (Signed)
Addended by: Deatra James on: 08/13/2021 10:52 AM ? ? Modules accepted: Orders ? ?

## 2021-08-13 NOTE — Telephone Encounter (Signed)
NEW NOTE NOT NEEDED °

## 2021-08-13 NOTE — Telephone Encounter (Signed)
Pt called back stating that [facility] don't carry stick canes and that she would like the Rx sent to a medical supply store.  ? ?Please advise.  ? ? ?

## 2021-09-02 ENCOUNTER — Other Ambulatory Visit: Payer: Self-pay | Admitting: Emergency Medicine

## 2021-09-02 DIAGNOSIS — Z1231 Encounter for screening mammogram for malignant neoplasm of breast: Secondary | ICD-10-CM

## 2021-09-15 ENCOUNTER — Other Ambulatory Visit: Payer: Self-pay | Admitting: Emergency Medicine

## 2021-09-18 ENCOUNTER — Ambulatory Visit: Payer: Medicare Other

## 2021-09-25 ENCOUNTER — Ambulatory Visit: Payer: Medicare Other

## 2021-09-29 DIAGNOSIS — G4733 Obstructive sleep apnea (adult) (pediatric): Secondary | ICD-10-CM | POA: Diagnosis not present

## 2021-10-09 ENCOUNTER — Other Ambulatory Visit: Payer: Self-pay | Admitting: Emergency Medicine

## 2021-10-09 DIAGNOSIS — I10 Essential (primary) hypertension: Secondary | ICD-10-CM

## 2021-10-12 ENCOUNTER — Ambulatory Visit (INDEPENDENT_AMBULATORY_CARE_PROVIDER_SITE_OTHER): Payer: Medicare Other

## 2021-10-12 DIAGNOSIS — Z0000 Encounter for general adult medical examination without abnormal findings: Secondary | ICD-10-CM | POA: Diagnosis not present

## 2021-10-12 NOTE — Patient Instructions (Signed)
[name] [name] , Thank you for taking time to come for your Medicare Wellness Visit. I appreciate your ongoing commitment to your health goals. Please review the following plan we discussed and let me know if I can assist you in the future.   Screening recommendations/referrals: Colonoscopy: 06/10/2015 Mammogram: 10/15/2020 Bone Density: 06/16/2017 Recommended yearly ophthalmology/optometry visit for glaucoma screening and checkup Recommended yearly dental visit for hygiene and checkup  Vaccinations: Influenza vaccine: completed  Pneumococcal vaccine: completed  Tdap vaccine: 05/28/2019 Shingles vaccine: completed     Advanced directives: yes   Conditions/risks identified: none   Next appointment: none    Preventive Care 42 Years and Older, Female Preventive care refers to lifestyle choices and visits with your health care provider that can promote health and wellness. What does preventive care include? A yearly physical exam. This is also called an annual well check. Dental exams once or twice a year. Routine eye exams. Ask your health care provider how often you should have your eyes checked. Personal lifestyle choices, including: Daily care of your teeth and gums. Regular physical activity. Eating a healthy diet. Avoiding tobacco and drug use. Limiting alcohol use. Practicing safe sex. Taking low-dose aspirin every day. Taking vitamin and mineral supplements as recommended by your health care provider. What happens during an annual well check? The services and screenings done by your health care provider during your annual well check will depend on your age, overall health, lifestyle risk factors, and family history of disease. Counseling  Your health care provider may ask you questions about your: Alcohol use. Tobacco use. Drug use. Emotional well-being. Home and relationship well-being. Sexual activity. Eating habits. History of falls. Memory and ability to  understand (cognition). Work and work Statistician. Reproductive health. Screening  You may have the following tests or measurements: Height, weight, and BMI. Blood pressure. Lipid and cholesterol levels. These may be checked every 5 years, or more frequently if you are over 55 years old. Skin check. Lung cancer screening. You may have this screening every year starting at age 38 if you have a 30-pack-year history of smoking and currently smoke or have quit within the past 15 years. Fecal occult blood test (FOBT) of the stool. You may have this test every year starting at age 49. Flexible sigmoidoscopy or colonoscopy. You may have a sigmoidoscopy every 5 years or a colonoscopy every 10 years starting at age 59. Hepatitis C blood test. Hepatitis B blood test. Sexually transmitted disease (STD) testing. Diabetes screening. This is done by checking your blood sugar (glucose) after you have not eaten for a while (fasting). You may have this done every 1-3 years. Bone density scan. This is done to screen for osteoporosis. You may have this done starting at age 57. Mammogram. This may be done every 1-2 years. Talk to your health care provider about how often you should have regular mammograms. Talk with your health care provider about your test results, treatment options, and if necessary, the need for more tests. Vaccines  Your health care provider may recommend certain vaccines, such as: Influenza vaccine. This is recommended every year. Tetanus, diphtheria, and acellular pertussis (Tdap, Td) vaccine. You may need a Td booster every 10 years. Zoster vaccine. You may need this after age 42. Pneumococcal 13-valent conjugate (PCV13) vaccine. One dose is recommended after age 35. Pneumococcal polysaccharide (PPSV23) vaccine. One dose is recommended after age 47. Talk to your health care provider about which screenings and vaccines you need and how often you  need them. This information is not  intended to replace advice given to you by your health care provider. Make sure you discuss any questions you have with your health care provider. Document Released: 05/16/2015 Document Revised: 01/07/2016 Document Reviewed: 02/18/2015 Elsevier Interactive Patient Education  2017 [place] Prevention in the Home Falls can cause injuries. They can happen to people of all ages. There are many things you can do to make your home safe and to help prevent falls. What can I do on the outside of my home? Regularly fix the edges of walkways and driveways and fix any cracks. Remove anything that might make you trip as you walk through a door, such as a raised step or threshold. Trim any bushes or trees on the path to your home. Use bright outdoor lighting. Clear any walking paths of anything that might make someone trip, such as rocks or tools. Regularly check to see if handrails are loose or broken. Make sure that both sides of any steps have handrails. Any raised decks and porches should have guardrails on the edges. Have any leaves, snow, or ice cleared regularly. Use sand or salt on walking paths during winter. Clean up any spills in your garage right away. This includes oil or grease spills. What can I do in the bathroom? Use night lights. Install grab bars by the toilet and in the tub and shower. Do not use towel bars as grab bars. Use non-skid mats or decals in the tub or shower. If you need to sit down in the shower, use a plastic, non-slip stool. Keep the floor dry. Clean up any water that spills on the floor as soon as it happens. Remove soap buildup in the tub or shower regularly. Attach bath mats securely with double-sided non-slip rug tape. Do not have throw rugs and other things on the floor that can make you trip. What can I do in the bedroom? Use night lights. Make sure that you have a light by your bed that is easy to reach. Do not use any sheets or blankets that are  too big for your bed. They should not hang down onto the floor. Have a firm chair that has side arms. You can use this for support while you get dressed. Do not have throw rugs and other things on the floor that can make you trip. What can I do in the kitchen? Clean up any spills right away. Avoid walking on wet floors. Keep items that you use a lot in easy-to-reach places. If you need to reach something above you, use a strong step stool that has a grab bar. Keep electrical cords out of the way. Do not use floor polish or wax that makes floors slippery. If you must use wax, use non-skid floor wax. Do not have throw rugs and other things on the floor that can make you trip. What can I do with my stairs? Do not leave any items on the stairs. Make sure that there are handrails on both sides of the stairs and use them. Fix handrails that are broken or loose. Make sure that handrails are as long as the stairways. Check any carpeting to make sure that it is firmly attached to the stairs. Fix any carpet that is loose or worn. Avoid having throw rugs at the top or bottom of the stairs. If you do have throw rugs, attach them to the floor with carpet tape. Make sure that you have a light switch  at the top of the stairs and the bottom of the stairs. If you do not have them, ask someone to add them for you. What else can I do to help prevent falls? Wear shoes that: Do not have high heels. Have rubber bottoms. Are comfortable and fit you well. Are closed at the toe. Do not wear sandals. If you use a stepladder: Make sure that it is fully opened. Do not climb a closed stepladder. Make sure that both sides of the stepladder are locked into place. Ask someone to hold it for you, if possible. Clearly mark and make sure that you can see: Any grab bars or handrails. First and last steps. Where the edge of each step is. Use tools that help you move around (mobility aids) if they are needed. These  include: Canes. Walkers. Scooters. Crutches. Turn on the lights when you go into a dark area. Replace any light bulbs as soon as they burn out. Set up your furniture so you have a clear path. Avoid moving your furniture around. If any of your floors are uneven, fix them. If there are any pets around you, be aware of where they are. Review your medicines with your doctor. Some medicines can make you feel dizzy. This can increase your chance of falling. Ask your doctor what other things that you can do to help prevent falls. This information is not intended to replace advice given to you by your health care provider. Make sure you discuss any questions you have with your health care provider. Document Released: 02/13/2009 Document Revised: 09/25/2015 Document Reviewed: 05/24/2014 Elsevier Interactive Patient Education  2017 [employer].

## 2021-10-12 NOTE — Progress Notes (Signed)
Subjective:   [name] [name] is a 71 y.o. female who presents for Medicare Annual (Subsequent) preventive examination.  I connected with Inda Merlin today by telephone and verified that I am speaking with the correct person using two identifiers. Location patient: home Location provider: work Persons participating in the virtual visit: patient, provider.   I discussed the limitations, risks, security and privacy concerns of performing an evaluation and management service by telephone and the availability of in person appointments. I also discussed with the patient that there may be a patient responsible charge related to this service. The patient expressed understanding and verbally consented to this telephonic visit.    Interactive audio and video telecommunications were attempted between this provider and patient, however failed, due to patient having technical difficulties OR patient did not have access to video capability.  We continued and completed visit with audio only.    Review of Systems     Cardiac Risk Factors include: advanced age (>30men, >67 women)     Objective:    Today's Vitals   There is no height or weight on file to calculate BMI.     10/12/2021    1:19 PM 05/18/2018    8:43 AM 05/17/2017    8:57 AM 09/25/2016    6:01 PM 05/27/2015   10:34 AM 03/14/2014   11:21 AM 01/29/2014    4:41 PM  Advanced Directives  Does Patient Have a Medical Advance Directive? Yes No No No No No No  Type of Paramedic of [place];Living will        Copy of [place] in Chart? No - copy requested        Would patient like information on creating a medical advance directive?  No - Patient declined No - Patient declined No - Patient declined   Yes - Educational materials given    Current Medications (verified) Outpatient Encounter Medications as of 10/12/2021  Medication Sig   aspirin 81 MG chewable tablet Chew 81 mg by mouth daily.    carbamazepine (TEGRETOL) 200 MG tablet Take 1.5 tablets twice daily   carvedilol (COREG) 3.125 MG tablet TAKE 1 TABLET(3.125 MG) BY MOUTH TWICE DAILY WITH A MEAL   cholecalciferol (VITAMIN D3) 25 MCG (1000 UT) tablet Take 1,000 Units by mouth daily.   escitalopram (LEXAPRO) 10 MG tablet TAKE 1 TABLET(10 MG) BY MOUTH AT BEDTIME   levETIRAcetam (KEPPRA) 750 MG tablet TAKE 2 TABLETS(1500 MG) BY MOUTH TWICE DAILY   lisinopril-hydrochlorothiazide (ZESTORETIC) 20-25 MG tablet TAKE 1 TABLET BY MOUTH DAILY   metFORMIN (GLUCOPHAGE) 500 MG tablet TAKE 1 TABLET(500 MG) BY MOUTH TWICE DAILY WITH A MEAL   Multiple Vitamins-Minerals (MULTIVITAMIN PO) Take 1 tablet by mouth daily.   rosuvastatin (CRESTOR) 20 MG tablet TAKE 1 TABLET(20 MG) BY MOUTH DAILY   No facility-administered encounter medications on file as of 10/12/2021.    Allergies (verified) Patient has no known allergies.   History: Past Medical History:  Diagnosis Date   Anxiety    Cataract    Chronic insomnia 10/13/2016   Depression    Gait disorder 03/14/2014   HTN (hypertension)    Hx of cardiovascular stress test    Lexiscan Myoview (1/16):  No ischemia, EF 68%, breast atten; Low Risk   Hyperlipemia    Hyperlipidemia    Phreesia 07/18/2020   Hypertension    Phreesia 07/18/2020   Memory difficulty 03/14/2014   Meningitis due to bacteria 01-18-14   OSA (obstructive  sleep apnea) 11/30/2016   Seizures ([place])    Sleep apnea    Phreesia 07/18/2020   TIA (transient ischemic attack)    Past Surgical History:  Procedure Laterality Date   NO PAST SURGERIES     Family History  Problem Relation Age of Onset   Heart attack Mother 11   Stroke Mother    Hypertension Mother    Heart attack Father 38   Cerebral aneurysm Sister    Hypertension Sister    Kidney disease Sister        ESRD; HD in last year   Hypertension Brother    Hyperlipidemia Brother    Rheum arthritis Sister    Arthritis Sister    Hypertension Brother     Hyperlipidemia Brother    Hyperlipidemia Brother    Hypertension Brother    Social History   Socioeconomic History   Marital status: Divorced    Spouse name: Not on file   Number of children: 1   Years of education: college   Highest education level: Some college, no degree  Occupational History   Occupation: Retired Scientist, research (physical sciences): [employer]  Tobacco Use   Smoking status: Former    Packs/day: 0.50    Years: 40.00    Total pack years: 20.00    Types: Cigarettes    Quit date: 07/09/2011    Years since quitting: 10.2   Smokeless tobacco: Never  Vaping Use   Vaping Use: Never used  Substance and Sexual Activity   Alcohol use: Yes    Alcohol/week: 0.0 standard drinks of alcohol    Comment: Rare   Drug use: No   Sexual activity: Never    Birth control/protection: Abstinence  Other Topics Concern   Not on file  Social History Narrative   Marital status: divorced x 35 years; not dating; not interested in 2019.      Children: 1 child/son (44); 2 grandchildren; [place]      Lives:  Alone in [place] in [place].      Employment: retired 2008; Engineer, technical sales.      Tobacco: quit smoking in 2013.  Smoked x many years       Alcohol: none; holidays only      Exercise: none; quit exercising two years ago in 2015.        Seatbelt: 100%; no texting. Does not drive in [id number] due to seizure activity.       ADLs: no driving in [id number] due to seizure activity; independent with ADLs.  Has cane for long distances      Advanced Directives: none; [name]/sister.  FULL CODE no prolonged measures.         Patient is single lives at home alone, has 1 child   Patient is right handed   Education level is 1 year of college   Caffeine consumption is 2 cups daily   Social Determinants of Health   Financial Resource Strain: Low Risk  (10/12/2021)   Overall Financial Resource Strain (CARDIA)    Difficulty of Paying Living Expenses: Not hard at all  Food Insecurity: No Food  Insecurity (10/12/2021)   Hunger Vital Sign    Worried About Running Out of Food in the Last Year: Never true    Ran Out of Food in the Last Year: Never true  Transportation Needs: No Transportation Needs (10/12/2021)   PRAPARE - Hydrologist (Medical): No    Lack of Transportation (Non-Medical): No  Physical Activity: Insufficiently Active (10/12/2021)   Exercise Vital Sign    Days of Exercise per Week: 2 days    Minutes of Exercise per Session: 20 min  Stress: No Stress Concern Present (10/12/2021)   [place]    Feeling of Stress : Not at all  Social Connections: Moderately Integrated (10/12/2021)   Social Connection and Isolation Panel [NHANES]    Frequency of Communication with Friends and Family: Three times a week    Frequency of Social Gatherings with Friends and Family: Three times a week    Attends Religious Services: More than 4 times per year    Active Member of Clubs or Organizations: Yes    Attends Archivist Meetings: 1 to 4 times per year    Marital Status: Divorced    Tobacco Counseling Counseling given: Not Answered   Clinical Intake:  Pre-visit preparation completed: Yes  Pain : No/denies pain     Nutritional Risks: None Diabetes: Yes CBG done?: No Did pt. bring in CBG monitor from home?: No  How often do you need to have someone help you when you read instructions, pamphlets, or other written materials from your doctor or pharmacy?: 1 - Never What is the last grade level you completed in school?: college  Diabetic?Yes Nutrition Risk Assessment:  Has the patient had any N/V/D within the last 2 months?  No  Does the patient have any non-healing wounds?  No  Has the patient had any unintentional weight loss or weight gain?  No   Diabetes:  Is the patient diabetic?  Yes  If diabetic, was a CBG obtained today?  No  Did the patient bring in their  glucometer from home?  No  How often do you monitor your CBG's? Never .   Financial Strains and Diabetes Management:  Are you having any financial strains with the device, your supplies or your medication? No .  Does the patient want to be seen by Chronic Care Management for management of their diabetes?  No  Would the patient like to be referred to a Nutritionist or for Diabetic Management?  No   Diabetic Exams:  Diabetic Eye Exam: Completed 08/2021 Diabetic Foot Exam: Overdue, Pt has been advised about the importance in completing this exam. Pt is scheduled for diabetic foot exam on next office visit .   Interpreter Needed?: No  Information entered by :: L. Devri Kreher,LPN   Activities of Daily Living    10/12/2021    1:20 PM  In your present state of health, do you have any difficulty performing the following activities:  Hearing? 0  Vision? 0  Difficulty concentrating or making decisions? 0  Walking or climbing stairs? 0  Dressing or bathing? 0  Doing errands, shopping? 0  Preparing Food and eating ? N  Using the Toilet? N  In the past six months, have you accidently leaked urine? N  Do you have problems with loss of bowel control? N  Managing your Medications? N  Managing your Finances? N  Housekeeping or managing your Housekeeping? N    Patient Care Team: Horald Pollen, MD as PCP - General (Internal Medicine) Teola Bradley, MD (Unknown Physician Specialty) Royston Cowper, DDS (Dentistry) Star Age, MD as Attending Physician (Neurology) Kathrynn Ducking, MD (Inactive) as Consulting Physician (Neurology)  Indicate any recent Medical Services you may have received from other than [facility] providers in the past year (date may be approximate).  Assessment:   This is a routine wellness examination for [name] [name].  Hearing/Vision screen Vision Screening - Comments:: Annual eye exam wear glasses   Dietary issues and exercise activities discussed: Current Exercise  Habits: The patient does not participate in regular exercise at present, Exercise limited by: neurologic condition(s)   Goals Addressed             This Visit's Progress    Exercise 3x per week (30 min per time)   On track    Patient states that she wants to try to start exercising more on a daily basis Eating Healthier       Depression Screen    10/12/2021    1:20 PM 10/12/2021    1:18 PM 07/28/2021    9:56 AM 07/21/2020    2:32 PM 11/22/2019    8:40 AM 08/24/2019    4:42 PM 05/28/2019    9:06 AM  PHQ 2/9 Scores  PHQ - 2 Score 0 0 1 0 0 0 0    Fall Risk    10/12/2021    1:23 PM 10/12/2021    1:21 PM 07/28/2021    9:56 AM 07/21/2020    2:32 PM 11/22/2019    8:40 AM  [place] in the past year? 0 0 0 0 0  Number falls in past yr: 0 0   0  Injury with Fall? 0 0   0  Risk for fall due to : Impaired balance/gait Impaired balance/gait     Follow up Falls evaluation completed;Education provided Falls evaluation completed  Falls evaluation completed Falls evaluation completed  Comment uses cane uses cane       FALL RISK PREVENTION PERTAINING TO THE HOME:  Any stairs in or around the home? Yes  If so, are there any without handrails? No  Home free of loose throw rugs in walkways, pet beds, electrical cords, etc? Yes  Adequate lighting in your home to reduce risk of falls? Yes   ASSISTIVE DEVICES UTILIZED TO PREVENT FALLS:  Life alert? No  Use of a cane, walker or w/c? Yes  Grab bars in the bathroom? yes Shower chair or bench in shower? Yes  Elevated toilet seat or a handicapped toilet? No    Cognitive Function:Normal cognitive status assessed by telephone conversation  by this Nurse Health Advisor. No abnormalities found.      08/26/2016    2:03 PM 07/26/2014    8:37 AM  MMSE - Mini Mental State Exam  Orientation to time 5 5  Orientation to Place 5 5  Registration 3 3  Attention/ Calculation 5 5  Recall 3 3  Language- name 2 objects 2 2  Language- repeat 1  1  Language- follow 3 step command 3 3  Language- read & follow direction 1 1  Write a sentence 1 1  Copy design 1 1  Total score 30 30        05/28/2019    9:07 AM 05/18/2018    8:57 AM 05/18/2018    8:51 AM 05/17/2017    9:05 AM  6CIT Screen  What Year? 0 points 0 points 0 points 0 points  What month? 0 points 0 points 0 points 0 points  What time? 0 points 0 points 0 points 0 points  Count back from 20 0 points 0 points 0 points 0 points  Months in reverse 0 points 0 points 0 points 0 points  Repeat phrase 0 points 0 points  2  points  Total Score 0 points 0 points  2 points    Immunizations Immunization History  Administered Date(s) Administered   Influenza, High Dose Seasonal PF 02/03/2018, 12/28/2018   Influenza-Unspecified 02/20/2013, 01/18/2014, 01/09/2016, 02/15/2017, 01/10/2020   Meningococcal Conjugate 10/23/2014   PFIZER(Purple Top)SARS-COV-2 Vaccination 05/25/2019, 06/15/2019, 02/07/2020   Pneumococcal Conjugate-13 04/17/2014   Pneumococcal Polysaccharide-23 04/18/2013, 05/26/2018   Tdap 03/08/2007, 05/28/2019   Zoster Recombinat (Shingrix) 12/28/2017, 03/03/2018   Zoster, Live 05/03/2010    TDAP status: Up to date  Flu Vaccine status: Up to date  Pneumococcal vaccine status: Up to date  Covid-19 vaccine status: Completed vaccines  Qualifies for Shingles Vaccine? Yes   Zostavax completed Yes   Shingrix Completed?: Yes  Screening Tests Health Maintenance  Topic Date Due   COVID-19 Vaccine (4 - Booster for Pfizer series) 04/03/2020   INFLUENZA VACCINE  12/01/2021   MAMMOGRAM  10/16/2022   COLONOSCOPY (Pts 45-77yrs Insurance coverage will need to be confirmed)  06/09/2025   TETANUS/TDAP  05/27/2029   Pneumonia Vaccine 45+ Years old  Completed   DEXA SCAN  Completed   Hepatitis C Screening  Completed   Zoster Vaccines- Shingrix  Completed   HPV VACCINES  Aged Out    Health Maintenance  Health Maintenance Due  Topic Date Due   COVID-19 Vaccine  (4 - Booster for [place] series) 04/03/2020    Colorectal cancer screening: Type of screening: Colonoscopy. Completed 06/10/2015. Repeat every 10 years  Mammogram status: Completed 10/15/2020. Repeat every year  Bone Density status: Completed 06/16/2017. Results reflect: Bone density results: OSTEOPENIA. Repeat every 5 years.  Lung Cancer Screening: (Low Dose CT Chest recommended if Age 56-80 years, 30 pack-year currently smoking OR have quit w/in 15years.) does not qualify.   Lung Cancer Screening Referral: n/a  Additional Screening:  Hepatitis C Screening: does not qualify;   Vision Screening: Recommended annual ophthalmology exams for early detection of glaucoma and other disorders of the eye. Is the patient up to date with their annual eye exam?  Yes  Who is the provider or what is the name of the office in which the patient attends annual eye exams? Dr.Preez  If pt is not established with a provider, would they like to be referred to a provider to establish care? No .   Dental Screening: Recommended annual dental exams for proper oral hygiene  Community Resource Referral / Chronic Care Management: CRR required this visit?  No   CCM required this visit?  No      Plan:     I have personally reviewed and noted the following in the patient's chart:   Medical and social history Use of alcohol, tobacco or illicit drugs  Current medications and supplements including opioid prescriptions.  Functional ability and status Nutritional status Physical activity Advanced directives List of other physicians Hospitalizations, surgeries, and ER visits in previous 12 months Vitals Screenings to include cognitive, depression, and falls Referrals and appointments  In addition, I have reviewed and discussed with patient certain preventive protocols, quality metrics, and best practice recommendations. A written personalized care plan for preventive services as well as general preventive  health recommendations were provided to patient.     Randel Pigg, LPN   [id number]   Nurse Notes: none

## 2021-10-21 ENCOUNTER — Ambulatory Visit
Admission: RE | Admit: 2021-10-21 | Discharge: 2021-10-21 | Disposition: A | Discharge status: Home / Self Care | Payer: Medicare Other | Source: Ambulatory Visit | Attending: Emergency Medicine | Admitting: Emergency Medicine

## 2021-10-21 DIAGNOSIS — Z1231 Encounter for screening mammogram for malignant neoplasm of breast: Secondary | ICD-10-CM | POA: Diagnosis not present

## 2021-10-27 ENCOUNTER — Other Ambulatory Visit: Payer: Self-pay | Admitting: Emergency Medicine

## 2021-10-27 DIAGNOSIS — E785 Hyperlipidemia, unspecified: Secondary | ICD-10-CM

## 2021-10-28 NOTE — Progress Notes (Unsigned)
PATIENT: [name] [name] DOB: May 14, 1950  REASON FOR VISIT: follow up HISTORY FROM: patient  HISTORY OF PRESENT ILLNESS: Today 10/28/21 [name] [name]   Update 10/29/20 SS: [name] [name] is a 71 year old female with history of seizures (staring episodes).  On carbamazepine and Keppra.  Last seizure event was around November 2021.  Carbamazepine was increased to 300 mg twice a day.  EEG was normal in January 2022. Diagnosed with Type 2 DM, A1C 6.6, started metformin, has lost 20 lbs.   CPAP download is listed below, overall shows excellent compliance, AHI is well treated at 1.0, leak in the 95th percentile is 15.1. Small issue with humidification tub having leaks, sent replacement parts 3 times, all with cracks, wants to go without using humidification at all. Denies any dry mouth. Uses full face mask. Missed 1 night when she was out of town. 2 nights she couldn't wear it well because of 2 molars, keeps having to adjust the mask from this healing. Sometimes seems like a leak.       REVIEW OF SYSTEMS: Out of a complete 14 system review of symptoms, the patient complains only of the following symptoms, and all other reviewed systems are negative.  Seizures, sleep apnea   ALLERGIES: No Known Allergies  HOME MEDICATIONS: Outpatient Medications Prior to Visit  Medication Sig Dispense Refill   aspirin 81 MG chewable tablet Chew 81 mg by mouth daily.     carbamazepine (TEGRETOL) 200 MG tablet Take 1.5 tablets twice daily 270 tablet 4   carvedilol (COREG) 3.125 MG tablet TAKE 1 TABLET(3.125 MG) BY MOUTH TWICE DAILY WITH A MEAL 180 tablet 0   cholecalciferol (VITAMIN D3) 25 MCG (1000 UT) tablet Take 1,000 Units by mouth daily.     escitalopram (LEXAPRO) 10 MG tablet TAKE 1 TABLET(10 MG) BY MOUTH AT BEDTIME 90 tablet 1   levETIRAcetam (KEPPRA) 750 MG tablet TAKE 2 TABLETS(1500 MG) BY MOUTH TWICE DAILY 360 tablet 4   lisinopril-hydrochlorothiazide (ZESTORETIC) 20-25 MG tablet  TAKE 1 TABLET BY MOUTH DAILY 90 tablet 1   metFORMIN (GLUCOPHAGE) 500 MG tablet TAKE 1 TABLET(500 MG) BY MOUTH TWICE DAILY WITH A MEAL 180 tablet 3   Multiple Vitamins-Minerals (MULTIVITAMIN PO) Take 1 tablet by mouth daily.     rosuvastatin (CRESTOR) 20 MG tablet TAKE 1 TABLET(20 MG) BY MOUTH DAILY 90 tablet 3   No facility-administered medications prior to visit.    PAST MEDICAL HISTORY: Past Medical History:  Diagnosis Date   Anxiety    Cataract    Chronic insomnia 10/13/2016   Depression    Gait disorder 03/14/2014   HTN (hypertension)    Hx of cardiovascular stress test    Lexiscan Myoview (1/16):  No ischemia, EF 68%, breast atten; Low Risk   Hyperlipemia    Hyperlipidemia    Phreesia 07/18/2020   Hypertension    Phreesia 07/18/2020   Memory difficulty 03/14/2014   Meningitis due to bacteria 01-18-14   OSA (obstructive sleep apnea) 11/30/2016   Seizures (HCC)    Sleep apnea    Phreesia 07/18/2020   TIA (transient ischemic attack)     PAST SURGICAL HISTORY: Past Surgical History:  Procedure Laterality Date   NO PAST SURGERIES      FAMILY HISTORY: Family History  Problem Relation Age of Onset   Heart attack Mother 27   Stroke Mother    Hypertension Mother    Heart attack Father 41   Cerebral aneurysm Sister    Hypertension Sister  Kidney disease Sister        ESRD; HD in last year   Hypertension Brother    Hyperlipidemia Brother    Rheum arthritis Sister    Arthritis Sister    Hypertension Brother    Hyperlipidemia Brother    Hyperlipidemia Brother    Hypertension Brother     SOCIAL HISTORY: Social History   Socioeconomic History   Marital status: Divorced    Spouse name: Not on file   Number of children: 1   Years of education: college   Highest education level: Some college, no degree  Occupational History   Occupation: Retired Financial planner: [employer]  Tobacco Use   Smoking status: Former    Packs/day: 0.50    Years: 40.00    Total pack  years: 20.00    Types: Cigarettes    Quit date: 07/09/2011    Years since quitting: 10.3   Smokeless tobacco: Never  Vaping Use   Vaping Use: Never used  Substance and Sexual Activity   Alcohol use: Yes    Alcohol/week: 0.0 standard drinks of alcohol    Comment: Rare   Drug use: No   Sexual activity: Never    Birth control/protection: Abstinence  Other Topics Concern   Not on file  Social History Narrative   Marital status: divorced x 35 years; not dating; not interested in 2019.      Children: 1 child/son (44); 2 grandchildren; [place]      Lives:  Alone in [place] in [place].      Employment: retired 2008; Administrator, arts.      Tobacco: quit smoking in 2013.  Smoked x many years       Alcohol: none; holidays only      Exercise: none; quit exercising two years ago in 2015.        Seatbelt: 100%; no texting. Does not drive in [address] due to seizure activity.       ADLs: no driving in [place] due to seizure activity; independent with ADLs.  Has cane for long distances      Advanced Directives: none; [name] [name]/sister.  FULL CODE no prolonged measures.         Patient is single lives at home alone, has 1 child   Patient is right handed   Education level is 1 year of college   Caffeine consumption is 2 cups daily   Social Determinants of Health   Financial Resource Strain: Low Risk  (10/12/2021)   Overall Financial Resource Strain (CARDIA)    Difficulty of Paying Living Expenses: Not hard at all  Food Insecurity: No Food Insecurity (10/12/2021)   Hunger Vital Sign    Worried About Running Out of Food in the Last Year: Never true    Ran Out of Food in the Last Year: Never true  Transportation Needs: No Transportation Needs (10/12/2021)   PRAPARE - Administrator, Civil Service (Medical): No    Lack of Transportation (Non-Medical): No  Physical Activity: Insufficiently Active (10/12/2021)   Exercise Vital Sign    Days of Exercise per Week: 2  days    Minutes of Exercise per Session: 20 min  Stress: No Stress Concern Present (10/12/2021)   [employer] of Occupational Health - Occupational Stress Questionnaire    Feeling of Stress : Not at all  Social Connections: Moderately Integrated (10/12/2021)   Social Connection and Isolation Panel [NHANES]    Frequency of Communication with Friends and  Family: Three times a week    Frequency of Social Gatherings with Friends and Family: Three times a week    Attends Religious Services: More than 4 times per year    Active Member of Clubs or Organizations: Yes    Attends Banker Meetings: 1 to 4 times per year    Marital Status: Divorced  Intimate Partner Violence: Not At Risk (10/12/2021)   Humiliation, Afraid, Rape, and Kick questionnaire    Fear of Current or Ex-Partner: No    Emotionally Abused: No    Physically Abused: No    Sexually Abused: No   PHYSICAL EXAM  There were no vitals filed for this visit.   There is no height or weight on file to calculate BMI.  Generalized: Well developed, in no acute distress   Neurological examination  Mentation: Alert oriented to time, place, history taking. Follows all commands speech and language fluent Cranial nerve II-XII: Pupils were equal round reactive to light. Extraocular movements were full, visual field were full on confrontational test. Facial sensation and strength were normal. Head turning and shoulder shrug  were normal and symmetric. Motor: The motor testing reveals 5 over 5 strength of all 4 extremities. Good symmetric motor tone is noted throughout.  Sensory: Sensory testing is intact to soft touch on all 4 extremities. No evidence of extinction is noted.  Coordination: Cerebellar testing reveals good finger-nose-finger and heel-to-shin bilaterally.  Gait and station: Gait is normal.  Reflexes: Deep tendon reflexes are symmetric but decreased throughout  DIAGNOSTIC DATA (LABS, IMAGING, TESTING) - I  reviewed patient records, labs, notes, testing and imaging myself where available.  Lab Results  Component Value Date   WBC 3.3 (L) 07/28/2021   HGB 10.7 (L) 07/28/2021   HCT 32.4 (L) 07/28/2021   MCV 79.5 07/28/2021   PLT 211.0 07/28/2021      Component Value Date/Time   NA 140 07/28/2021 1052   NA 142 10/29/2020 1151   K 4.1 07/28/2021 1052   CL 102 07/28/2021 1052   CO2 32 07/28/2021 1052   GLUCOSE 98 07/28/2021 1052   BUN 27 (H) 07/28/2021 1052   BUN 23 10/29/2020 1151   CREATININE 0.88 07/28/2021 1052   CREATININE 0.89 10/28/2015 1004   CALCIUM 9.4 07/28/2021 1052   PROT 6.6 07/28/2021 1052   PROT 6.4 10/29/2020 1151   ALBUMIN 4.1 07/28/2021 1052   ALBUMIN 4.5 10/29/2020 1151   AST 13 07/28/2021 1052   ALT 11 07/28/2021 1052   ALKPHOS 91 07/28/2021 1052   BILITOT 0.3 07/28/2021 1052   BILITOT <0.2 10/29/2020 1151   GFRNONAA 70 04/14/2020 1609   GFRNONAA 79 10/17/2013 1327   GFRAA 81 04/14/2020 1609   GFRAA >89 10/17/2013 1327   Lab Results  Component Value Date   CHOL 189 07/28/2021   HDL 73.90 07/28/2021   LDLCALC 97 07/28/2021   TRIG 91.0 07/28/2021   CHOLHDL 3 07/28/2021   Lab Results  Component Value Date   HGBA1C 6.5 07/28/2021   No results found for: "VITAMINB12" Lab Results  Component Value Date   TSH 2.570 05/26/2018    ASSESSMENT AND PLAN 71 y.o. year old female  has a past medical history of Anxiety, Cataract, Chronic insomnia (10/13/2016), Depression, Gait disorder (03/14/2014), HTN (hypertension), cardiovascular stress test, Hyperlipemia, Hyperlipidemia, Hypertension, Memory difficulty (03/14/2014), Meningitis due to bacteria (01-18-14), OSA (obstructive sleep apnea) (11/30/2016), Seizures (HCC), Sleep apnea, and TIA (transient ischemic attack). here with:  1.  Episodes of transient alteration  of awareness, probable seizures -No recurrent spells since November 2021 -Continue Keppra 750 mg, 2 tablets twice a day -Continue carbamazepine 200 mg,  1.5 tablets twice a day -Check routine labs -EEG was normal in January 2022 -Follow-up in 1 year or sooner if needed, call for spells   2.  OSA on CPAP -CPAP download shows overall excellent compliance -Encouraged to continue to use nightly greater than 4 hours -Will send an order for new supplies, also reach out to CPAP [employer] health rep to determine why she keeps getting a cracked humidifier    Margie Ege, AGNP-C, DNP 10/28/2021, 4:19 PM [facility] [address] [place], [place] [address] [phone]

## 2021-10-29 ENCOUNTER — Ambulatory Visit: Payer: Medicare Other | Admitting: Neurology

## 2021-10-29 ENCOUNTER — Encounter: Payer: Self-pay | Admitting: Neurology

## 2021-10-29 VITALS — BP 114/69 | HR 57 | Ht 65.5 in | Wt 238.0 lb

## 2021-10-29 DIAGNOSIS — Z9989 Dependence on other enabling machines and devices: Secondary | ICD-10-CM | POA: Diagnosis not present

## 2021-10-29 DIAGNOSIS — G40909 Epilepsy, unspecified, not intractable, without status epilepticus: Secondary | ICD-10-CM

## 2021-10-29 DIAGNOSIS — G4733 Obstructive sleep apnea (adult) (pediatric): Secondary | ICD-10-CM

## 2021-10-29 MED ORDER — CARBAMAZEPINE 200 MG PO TABS: ORAL_TABLET | ORAL | 4 refills | Status: DC

## 2021-10-29 MED ORDER — LEVETIRACETAM 750 MG PO TABS: ORAL_TABLET | ORAL | 4 refills | Status: DC

## 2021-10-31 LAB — LEVETIRACETAM LEVEL: Levetiracetam Lvl: 55.5 ug/mL — ABNORMAL HIGH (ref 10.0–40.0)

## 2021-10-31 LAB — CARBAMAZEPINE LEVEL, TOTAL: Carbamazepine (Tegretol), S: 10.2 ug/mL (ref 4.0–12.0)

## 2021-12-14 ENCOUNTER — Telehealth: Payer: Self-pay | Admitting: Emergency Medicine

## 2021-12-14 NOTE — Telephone Encounter (Signed)
Office visit

## 2021-12-14 NOTE — Telephone Encounter (Signed)
Pt is requesting a call from the nurse to discuss some shortness of breath she has been experiencing over the last few weeks. Pt states she has anxiety and she believes that is what is causing the shortness of breath, but she said she would feel better if she could just discuss it with the nurse.   Please advise

## 2021-12-15 NOTE — Telephone Encounter (Signed)
Pt scheduled for OV.  

## 2021-12-17 ENCOUNTER — Encounter: Payer: Self-pay | Admitting: Emergency Medicine

## 2021-12-17 ENCOUNTER — Ambulatory Visit (INDEPENDENT_AMBULATORY_CARE_PROVIDER_SITE_OTHER): Payer: Medicare Other

## 2021-12-17 ENCOUNTER — Ambulatory Visit (INDEPENDENT_AMBULATORY_CARE_PROVIDER_SITE_OTHER): Payer: Medicare Other | Admitting: Emergency Medicine

## 2021-12-17 VITALS — BP 134/68 | HR 51 | Temp 98.5°F | Ht 65.5 in | Wt 239.5 lb

## 2021-12-17 DIAGNOSIS — R0609 Other forms of dyspnea: Secondary | ICD-10-CM

## 2021-12-17 DIAGNOSIS — R079 Chest pain, unspecified: Secondary | ICD-10-CM | POA: Diagnosis not present

## 2021-12-17 DIAGNOSIS — E785 Hyperlipidemia, unspecified: Secondary | ICD-10-CM

## 2021-12-17 DIAGNOSIS — R0602 Shortness of breath: Secondary | ICD-10-CM | POA: Diagnosis not present

## 2021-12-17 DIAGNOSIS — G4733 Obstructive sleep apnea (adult) (pediatric): Secondary | ICD-10-CM

## 2021-12-17 DIAGNOSIS — I10 Essential (primary) hypertension: Secondary | ICD-10-CM | POA: Diagnosis not present

## 2021-12-17 DIAGNOSIS — R7303 Prediabetes: Secondary | ICD-10-CM

## 2021-12-17 LAB — CBC WITH DIFFERENTIAL/PLATELET
Basophils Absolute: 0 10*3/uL (ref 0.0–0.1)
Basophils Relative: 0.4 % (ref 0.0–3.0)
Eosinophils Absolute: 0.1 10*3/uL (ref 0.0–0.7)
Eosinophils Relative: 2.8 % (ref 0.0–5.0)
HCT: 33.7 % — ABNORMAL LOW (ref 36.0–46.0)
Hemoglobin: 10.9 g/dL — ABNORMAL LOW (ref 12.0–15.0)
Lymphocytes Relative: 30.3 % (ref 12.0–46.0)
Lymphs Abs: 0.9 10*3/uL (ref 0.7–4.0)
MCHC: 32.3 g/dL (ref 30.0–36.0)
MCV: 80.6 fl (ref 78.0–100.0)
Monocytes Absolute: 0.3 10*3/uL (ref 0.1–1.0)
Monocytes Relative: 10.8 % (ref 3.0–12.0)
Neutro Abs: 1.6 10*3/uL (ref 1.4–7.7)
Neutrophils Relative %: 55.7 % (ref 43.0–77.0)
Platelets: 210 10*3/uL (ref 150.0–400.0)
RBC: 4.18 Mil/uL (ref 3.87–5.11)
RDW: 13.5 % (ref 11.5–15.5)
WBC: 2.9 10*3/uL — ABNORMAL LOW (ref 4.0–10.5)

## 2021-12-17 LAB — COMPREHENSIVE METABOLIC PANEL
ALT: 10 U/L (ref 0–35)
AST: 14 U/L (ref 0–37)
Albumin: 4.2 g/dL (ref 3.5–5.2)
Alkaline Phosphatase: 93 U/L (ref 39–117)
BUN: 20 mg/dL (ref 6–23)
CO2: 32 mEq/L (ref 19–32)
Calcium: 9.2 mg/dL (ref 8.4–10.5)
Chloride: 100 mEq/L (ref 96–112)
Creatinine, Ser: 0.88 mg/dL (ref 0.40–1.20)
GFR: 66.43 mL/min (ref >60.00)
Glucose, Bld: 94 mg/dL (ref 70–99)
Potassium: 4.2 mEq/L (ref 3.5–5.1)
Sodium: 139 mEq/L (ref 135–145)
Total Bilirubin: 0.2 mg/dL (ref 0.2–1.2)
Total Protein: 6.6 g/dL (ref 6.0–8.3)

## 2021-12-17 LAB — LIPID PANEL
Cholesterol: 202 mg/dL — ABNORMAL HIGH (ref 0–200)
HDL: 73.6 mg/dL (ref >39.00)
LDL Cholesterol: 105 mg/dL — ABNORMAL HIGH (ref 0–99)
NonHDL: 128.27
Total CHOL/HDL Ratio: 3
Triglycerides: 118 mg/dL (ref 0.0–149.0)
VLDL: 23.6 mg/dL (ref 0.0–40.0)

## 2021-12-17 LAB — HEMOGLOBIN A1C: Hgb A1c MFr Bld: 6.5 % (ref 4.6–6.5)

## 2021-12-17 LAB — TSH: TSH: 1.69 u[IU]/mL (ref 0.35–5.50)

## 2021-12-17 NOTE — Assessment & Plan Note (Signed)
Stable.  Diet and nutrition discussed.  Continue rosuvastatin 20 mg daily.  

## 2021-12-17 NOTE — Assessment & Plan Note (Addendum)
Suspected unstable angina. EKG shows sinus bradycardia with first-degree AV block. Could be related to beta-blocker therapy. Needs cardiology evaluation and work-up. Continue baby aspirin and rosuvastatin 20 mg daily. Referral placed today.

## 2021-12-17 NOTE — Assessment & Plan Note (Signed)
Stable on CPAP treatment. 

## 2021-12-17 NOTE — Assessment & Plan Note (Signed)
Well-controlled hypertension.  May be developing hypertensive heart disease. BP Readings from Last 3 Encounters:  12/17/21 134/68  10/29/21 114/69  07/28/21 110/68  Continue Zestoretic 20-25 mg daily and carvedilol 3.125 mg twice a day. Needs cardiology evaluation.

## 2021-12-17 NOTE — Assessment & Plan Note (Signed)
Most likely cardiac in origin.  Possibly related to ischemic events. Chest x-ray within normal limits. Needs cardiology evaluation. Referral placed today.

## 2021-12-17 NOTE — Assessment & Plan Note (Signed)
Stable.  Diet and nutrition discussed. Advised to decrease amount of daily carbohydrate intake and daily calories and increase amount of plant based protein in her daily diet.

## 2021-12-17 NOTE — Patient Instructions (Signed)
Shortness of Breath, Adult Shortness of breath means you have trouble breathing. Shortness of breath could be a sign of a medical problem. Follow these instructions at home:  Pollution Do not smoke or use any products that contain nicotine or tobacco. If you need help quitting, ask your doctor. Avoid things that can make it harder to breathe, such as: Smoke of all kinds. This includes smoke from campfires or forest fires. Do not smoke or allow others to smoke in your home. Mold. Dust. Air pollution. Chemical smells. Things that can give you an allergic reaction (allergens) if you have allergies. Keep your living space clean. Use products that help remove mold and dust. General instructions Watch for any changes in your symptoms. Take over-the-counter and prescription medicines only as told by your doctor. This includes oxygen therapy and inhaled medicines. Rest as needed. Return to your normal activities when your doctor says that it is safe. Keep all follow-up visits. Contact a doctor if: Your condition does not get better as soon as expected. You have a hard time doing your normal activities, even after you rest. You have new symptoms. You cannot walk up stairs. You cannot exercise the way you normally do. Get help right away if: Your shortness of breath gets worse. You have trouble breathing when you are resting. You feel light-headed or you faint. You have a cough that is not helped by medicines. You cough up blood. You have pain with breathing. You have pain in your chest, arms, shoulders, or belly (abdomen). You have a fever. These symptoms may be an emergency. Get help right away. Call 911. Do not wait to see if the symptoms will go away. Do not drive yourself to the hospital. Summary Shortness of breath is when you have trouble breathing enough air. It can be a sign of a medical problem. Avoid things that make it hard for you to breathe, such as smoking, pollution,  mold, and dust. Watch for any changes in your symptoms. Contact your doctor if you do not get better or you get worse. This information is not intended to replace advice given to you by your health care provider. Make sure you discuss any questions you have with your health care provider. Document Revised: 12/06/2020 Document Reviewed: 12/06/2020 Elsevier Patient Education  2023 [employer]  

## 2021-12-17 NOTE — Progress Notes (Signed)
[name] [name] 71 y.o.   Chief Complaint  Patient presents with   Shortness of Breath    Pt states she was SOB this am while doing home tasks    HISTORY OF PRESENT ILLNESS: This is a 71 y.o. female complaining of several weeks of intermittent episodes of chest pain associated with chest pressure mostly on exertion.  HPI   Prior to Admission medications   Medication Sig Start Date End Date Taking? Authorizing Provider  aspirin 81 MG chewable tablet Chew 81 mg by mouth daily.   Yes [provider]  carbamazepine (TEGRETOL) 200 MG tablet Take 1.5 tablets twice daily 10/29/21  Yes Glean Salvo, NP  carvedilol (COREG) 3.125 MG tablet TAKE 1 TABLET(3.125 MG) BY MOUTH TWICE DAILY WITH A MEAL 10/09/21  Yes Oziah Vitanza, Eilleen Kempf, MD  cholecalciferol (VITAMIN D3) 25 MCG (1000 UT) tablet Take 1,000 Units by mouth daily.   Yes [provider]  escitalopram (LEXAPRO) 10 MG tablet TAKE 1 TABLET(10 MG) BY MOUTH AT BEDTIME 09/15/21  Yes Kyian Obst, Eilleen Kempf, MD  levETIRAcetam (KEPPRA) 750 MG tablet TAKE 2 TABLETS(1500 MG) BY MOUTH TWICE DAILY 10/29/21  Yes Glean Salvo, NP  lisinopril-hydrochlorothiazide (ZESTORETIC) 20-25 MG tablet TAKE 1 TABLET BY MOUTH DAILY 09/15/21  Yes Bitha Fauteux, Eilleen Kempf, MD  metFORMIN (GLUCOPHAGE) 500 MG tablet TAKE 1 TABLET(500 MG) BY MOUTH TWICE DAILY WITH A MEAL 07/12/21  Yes Briggett Tuccillo, Eilleen Kempf, MD  Multiple Vitamins-Minerals (MULTIVITAMIN PO) Take 1 tablet by mouth daily.   Yes [provider]  rosuvastatin (CRESTOR) 20 MG tablet TAKE 1 TABLET(20 MG) BY MOUTH DAILY 10/27/21  Yes Georgina Quint, MD    No Known Allergies  Patient Active Problem List   Diagnosis Date Noted   Seizure disorder (HCC) 05/28/2019   Prediabetes 05/28/2019   Staring episodes 11/24/2017   OSA (obstructive sleep apnea) 11/30/2016   Chronic insomnia 10/13/2016   Transient cerebral ischemia 08/26/2016   Transient memory loss 08/26/2016   Morbid obesity  (HCC) 05/31/2016   Glucose intolerance (impaired glucose tolerance) 05/18/2016   Pronation deformity of ankle, acquired 04/19/2014   Generalized anxiety disorder 03/15/2014   Memory difficulty 03/14/2014   Demand myocardial ischemic related infarction 01/23/2014    Class: Diagnosis of   Depression, reactive 10/12/2012   Chronic neutropenia (HCC) 04/14/2012   Dyslipidemia 10/20/2011   HTN (hypertension) 10/20/2011    Past Medical History:  Diagnosis Date   Anxiety    Cataract    Chronic insomnia 10/13/2016   Depression    Gait disorder 03/14/2014   HTN (hypertension)    Hx of cardiovascular stress test    Lexiscan Myoview (1/16):  No ischemia, EF 68%, breast atten; Low Risk   Hyperlipemia    Hyperlipidemia    Phreesia 07/18/2020   Hypertension    Phreesia 07/18/2020   Memory difficulty 03/14/2014   Meningitis due to bacteria 01-18-14   OSA (obstructive sleep apnea) 11/30/2016   Seizures (HCC)    Sleep apnea    Phreesia 07/18/2020   TIA (transient ischemic attack)     Past Surgical History:  Procedure Laterality Date   NO PAST SURGERIES      Social History   Socioeconomic History   Marital status: Divorced    Spouse name: Not on file   Number of children: 1   Years of education: college   Highest education level: Some college, no degree  Occupational History   Occupation: Retired Financial planner: [employer]  Tobacco  Use   Smoking status: Former    Packs/day: 0.50    Years: 40.00    Total pack years: 20.00    Types: Cigarettes    Quit date: 07/09/2011    Years since quitting: 10.4   Smokeless tobacco: Never  Vaping Use   Vaping Use: Never used  Substance and Sexual Activity   Alcohol use: Yes    Alcohol/week: 0.0 standard drinks of alcohol    Comment: Rare   Drug use: No   Sexual activity: Never    Birth control/protection: Abstinence  Other Topics Concern   Not on file  Social History Narrative   Marital status: divorced x 35 years; not dating; not  interested in 2019.      Children: 1 child/son (44); 2 grandchildren; [place]      Lives:  Alone in [place] in [place].      Employment: retired 2008; Administrator, arts.      Tobacco: quit smoking in 2013.  Smoked x many years       Alcohol: none; holidays only      Exercise: none; quit exercising two years ago in 2015.        Seatbelt: 100%; no texting. Does not drive in [address] due to seizure activity.       ADLs: no driving in [address]10 due to seizure activity; independent with ADLs.  Has cane for long distances      Advanced Directives: none; [name]/sister.  FULL CODE no prolonged measures.         Patient is single lives at home alone, has 1 child   Patient is right handed   Education level is 1 year of college   Caffeine consumption is 2 cups daily   Social Determinants of Health   Financial Resource Strain: Low Risk  (10/12/2021)   Overall Financial Resource Strain (CARDIA)    Difficulty of Paying Living Expenses: Not hard at all  Food Insecurity: No Food Insecurity (10/12/2021)   Hunger Vital Sign    Worried About Running Out of Food in the Last Year: Never true    Ran Out of Food in the Last Year: Never true  Transportation Needs: No Transportation Needs (10/12/2021)   PRAPARE - Administrator, Civil Service (Medical): No    Lack of Transportation (Non-Medical): No  Physical Activity: Insufficiently Active (10/12/2021)   Exercise Vital Sign    Days of Exercise per Week: 2 days    Minutes of Exercise per Session: 20 min  Stress: No Stress Concern Present (10/12/2021)   [employer] of Occupational Health - Occupational Stress Questionnaire    Feeling of Stress : Not at all  Social Connections: Moderately Integrated (10/12/2021)   Social Connection and Isolation Panel [NHANES]    Frequency of Communication with Friends and Family: Three times a week    Frequency of Social Gatherings with Friends and Family: Three times a week     Attends Religious Services: More than 4 times per year    Active Member of Clubs or Organizations: Yes    Attends Banker Meetings: 1 to 4 times per year    Marital Status: Divorced  Intimate Partner Violence: Not At Risk (10/12/2021)   Humiliation, Afraid, Rape, and Kick questionnaire    Fear of Current or Ex-Partner: No    Emotionally Abused: No    Physically Abused: No    Sexually Abused: No    Family History  Problem Relation Age of Onset  Heart attack Mother 39   Stroke Mother    Hypertension Mother    Heart attack Father 78   Cerebral aneurysm Sister    Hypertension Sister    Kidney disease Sister        ESRD; HD in last year   Hypertension Brother    Hyperlipidemia Brother    Rheum arthritis Sister    Arthritis Sister    Hypertension Brother    Hyperlipidemia Brother    Hyperlipidemia Brother    Hypertension Brother      Review of Systems  Constitutional: Negative.  Negative for chills and fever.  HENT: Negative.  Negative for congestion and sore throat.   Respiratory:  Positive for shortness of breath. Negative for cough, hemoptysis and wheezing.   Cardiovascular:  Positive for chest pain. Negative for palpitations.  Gastrointestinal:  Negative for abdominal pain, blood in stool, melena, nausea and vomiting.  Skin: Negative.  Negative for rash.  Neurological: Negative.  Negative for dizziness and headaches.  All other systems reviewed and are negative.   Today's Vitals   12/17/21 1356  BP: 134/68  Pulse: (!) 51  Temp: 98.5 F (36.9 C)  TempSrc: Oral  SpO2: 96%  Weight: 239 lb 8 oz (108.6 kg)  Height: 5' 5.5" (1.664 m)   Body mass index is 39.25 kg/m. Wt Readings from Last 3 Encounters:  12/17/21 239 lb 8 oz (108.6 kg)  10/29/21 238 lb (108 kg)  07/28/21 238 lb (108 kg)    Physical Exam Vitals reviewed.  Constitutional:      Appearance: She is well-developed. She is obese.  HENT:     Head: Normocephalic.     Mouth/Throat:      Mouth: Mucous membranes are moist.     Pharynx: Oropharynx is clear.  Eyes:     Extraocular Movements: Extraocular movements intact.     Conjunctiva/sclera: Conjunctivae normal.     Pupils: Pupils are equal, round, and reactive to light.  Cardiovascular:     Rate and Rhythm: Regular rhythm. Bradycardia present.     Pulses: Normal pulses.     Heart sounds: Normal heart sounds.  Pulmonary:     Effort: Pulmonary effort is normal.     Breath sounds: Normal breath sounds.  Abdominal:     Palpations: Abdomen is soft.     Tenderness: There is no abdominal tenderness.  Musculoskeletal:     Cervical back: No tenderness.  Lymphadenopathy:     Cervical: No cervical adenopathy.  Skin:    General: Skin is warm and dry.     Capillary Refill: Capillary refill takes less than 2 seconds.  Neurological:     General: No focal deficit present.     Mental Status: She is alert and oriented to person, place, and time.  Psychiatric:        Mood and Affect: Mood normal.        Behavior: Behavior normal.    EKG: Sinus bradycardia with first-degree AV block and ventricular rate of 50/min.  No acute ischemic changes.  DG Chest 2 View  Result Date: 12/17/2021 CLINICAL DATA:  Shortness of breath. EXAM: CHEST - 2 VIEW COMPARISON:  Chest x-ray 01/18/2014 FINDINGS: The heart size and mediastinal contours are within normal limits. Both lungs are clear. The visualized skeletal structures are unremarkable. IMPRESSION: No active cardiopulmonary disease. Electronically Signed   By: Darliss Cheney M.D.   On: 12/17/2021 15:12     ASSESSMENT & PLAN: A total of 51 minutes was spent  with the patient and counseling/coordination of care regarding preparing for this visit, review of most recent office visit notes, review of multiple chronic medical problems and their management, review of chest x-ray report, review of all medications, review of most recent blood work results, differential diagnosis of symptoms, need for  cardiology evaluation, prognosis, documentation, and need for follow-up.  Problem List Items Addressed This Visit       Cardiovascular and Mediastinum   HTN (hypertension) (Chronic)    Well-controlled hypertension.  May be developing hypertensive heart disease. BP Readings from Last 3 Encounters:  12/17/21 134/68  10/29/21 114/69  07/28/21 110/68  Continue Zestoretic 20-25 mg daily and carvedilol 3.125 mg twice a day. Needs cardiology evaluation.         Respiratory   OSA (obstructive sleep apnea)    Stable on CPAP treatment.        Other   Dyslipidemia    Stable.  Diet and nutrition discussed. Continue rosuvastatin 20 mg daily.      Prediabetes    Stable.  Diet and nutrition discussed. Advised to decrease amount of daily carbohydrate intake and daily calories and increase amount of plant based protein in her daily diet.      Dyspnea on exertion - Primary    Most likely cardiac in origin.  Possibly related to ischemic events. Chest x-ray within normal limits. Needs cardiology evaluation. Referral placed today.      Relevant Orders   DG Chest 2 View (Completed)   Ambulatory referral to Cardiology   CBC with Differential/Platelet   Comprehensive metabolic panel   Hemoglobin A1c   Lipid panel   TSH   Nonspecific chest pain    Suspected unstable angina. EKG shows sinus bradycardia with first-degree AV block. Could be related to beta-blocker therapy. Needs cardiology evaluation and work-up. Continue baby aspirin and rosuvastatin 20 mg daily. Referral placed today.      Relevant Orders   EKG 12-Lead   Ambulatory referral to Cardiology   CBC with Differential/Platelet   Comprehensive metabolic panel   Hemoglobin A1c   Lipid panel   TSH   Other Visit Diagnoses     Essential hypertension          Patient Instructions  Shortness of Breath, Adult Shortness of breath means you have trouble breathing. Shortness of breath could be a sign of a medical  problem. Follow these instructions at home:  Pollution Do not smoke or use any products that contain nicotine or tobacco. If you need help quitting, ask your doctor. Avoid things that can make it harder to breathe, such as: Smoke of all kinds. This includes smoke from campfires or forest fires. Do not smoke or allow others to smoke in your home. Mold. Dust. Air pollution. Chemical smells. Things that can give you an allergic reaction (allergens) if you have allergies. Keep your living space clean. Use products that help remove mold and dust. General instructions Watch for any changes in your symptoms. Take over-the-counter and prescription medicines only as told by your doctor. This includes oxygen therapy and inhaled medicines. Rest as needed. Return to your normal activities when your doctor says that it is safe. Keep all follow-up visits. Contact a doctor if: Your condition does not get better as soon as expected. You have a hard time doing your normal activities, even after you rest. You have new symptoms. You cannot walk up stairs. You cannot exercise the way you normally do. Get help right away if: Your shortness  of breath gets worse. You have trouble breathing when you are resting. You feel light-headed or you faint. You have a cough that is not helped by medicines. You cough up blood. You have pain with breathing. You have pain in your chest, arms, shoulders, or belly (abdomen). You have a fever. These symptoms may be an emergency. Get help right away. Call 911. Do not wait to see if the symptoms will go away. Do not drive yourself to the hospital. Summary Shortness of breath is when you have trouble breathing enough air. It can be a sign of a medical problem. Avoid things that make it hard for you to breathe, such as smoking, pollution, mold, and dust. Watch for any changes in your symptoms. Contact your doctor if you do not get better or you get worse. This  information is not intended to replace advice given to you by your health care provider. Make sure you discuss any questions you have with your health care provider. Document Revised: 12/06/2020 Document Reviewed: 12/06/2020 Elsevier Patient Education  2023 [employer]    Edwina Barth, MD  Primary Care at [facility]

## 2021-12-20 NOTE — Progress Notes (Unsigned)
Cardiology Office Note:    Date:  12/21/2021   ID:  [name] [name], DOB 1950-05-21, MRN [id number]  PCP:  Georgina Quint, MD  Cardiologist:  Little Ishikawa, MD  Electrophysiologist:  None   Referring MD: Georgina Quint, *   Chief Complaint  Patient presents with   Chest Pain   Shortness of Breath    History of Present Illness:    [name] [name] is a 71 y.o. female with a hx of hypertension, hyperlipidemia, OSA, TIA, bacterial meningitis, seizures who is referred by Dr. Alvy Bimler for evaluation of chest pain and dyspnea on exertion.  She reports that she has had chest pressure recently.  Describes as pressure in central chest, occurring every day.  Can occur with exertion, such as when walking up stairs, but not always.  Also can occur when she is just sitting in a chair resting or when she is coloring.  Can last for hours.  Most active thing she does is grocery shopping, has not reported chest pain with this.  Does report she gets short of breath.  She denies any lightheadedness, syncope, lower extremity edema.  She reports palpitations, occurring once per month and lasting for few seconds.  She smoked for 40 years, about 0.5 packs/day, quit age 72.  Family history includes father died of MI at 60 and mother died of MI at 75.   Past Medical History:  Diagnosis Date   Anxiety    Cataract    Chronic insomnia 10/13/2016   Depression    Gait disorder 03/14/2014   HTN (hypertension)    Hx of cardiovascular stress test    Lexiscan Myoview (1/16):  No ischemia, EF 68%, breast atten; Low Risk   Hyperlipemia    Hyperlipidemia    Phreesia 07/18/2020   Hypertension    Phreesia 07/18/2020   Memory difficulty 03/14/2014   Meningitis due to bacteria 01-18-14   OSA (obstructive sleep apnea) 11/30/2016   Seizures (HCC)    Sleep apnea    Phreesia 07/18/2020   TIA (transient ischemic attack)     Past Surgical History:  Procedure Laterality Date   NO PAST  SURGERIES      Current Medications: Current Meds  Medication Sig   aspirin 81 MG chewable tablet Chew 81 mg by mouth daily.   carbamazepine (TEGRETOL) 200 MG tablet Take 1.5 tablets twice daily   carvedilol (COREG) 3.125 MG tablet TAKE 1 TABLET(3.125 MG) BY MOUTH TWICE DAILY WITH A MEAL   cholecalciferol (VITAMIN D3) 25 MCG (1000 UT) tablet Take 1,000 Units by mouth daily.   escitalopram (LEXAPRO) 10 MG tablet TAKE 1 TABLET(10 MG) BY MOUTH AT BEDTIME   levETIRAcetam (KEPPRA) 750 MG tablet TAKE 2 TABLETS(1500 MG) BY MOUTH TWICE DAILY   lisinopril-hydrochlorothiazide (ZESTORETIC) 20-25 MG tablet TAKE 1 TABLET BY MOUTH DAILY   metFORMIN (GLUCOPHAGE) 500 MG tablet TAKE 1 TABLET(500 MG) BY MOUTH TWICE DAILY WITH A MEAL   Multiple Vitamins-Minerals (MULTIVITAMIN PO) Take 1 tablet by mouth daily.   nitroGLYCERIN (NITROSTAT) 0.4 MG SL tablet Place 1 tablet (0.4 mg total) under the tongue every 5 (five) minutes as needed for chest pain.   rosuvastatin (CRESTOR) 20 MG tablet TAKE 1 TABLET(20 MG) BY MOUTH DAILY     Allergies:   Patient has no known allergies.   Social History   Socioeconomic History   Marital status: Divorced    Spouse name: Not on file   Number of children: 1   Years of education:  college   Highest education level: Some college, no degree  Occupational History   Occupation: Retired Financial planner: [employer]  Tobacco Use   Smoking status: Former    Packs/day: 0.50    Years: 40.00    Total pack years: 20.00    Types: Cigarettes    Quit date: 07/09/2011    Years since quitting: 10.4   Smokeless tobacco: Never  Vaping Use   Vaping Use: Never used  Substance and Sexual Activity   Alcohol use: Yes    Alcohol/week: 0.0 standard drinks of alcohol    Comment: Rare   Drug use: No   Sexual activity: Never    Birth control/protection: Abstinence  Other Topics Concern   Not on file  Social History Narrative   Marital status: divorced x 35 years; not dating; not interested in  2019.      Children: 1 child/son (44); 2 grandchildren; [place]      Lives:  Alone in [place] in [place].      Employment: retired 2008; Administrator, arts.      Tobacco: quit smoking in 2013.  Smoked x many years       Alcohol: none; holidays only      Exercise: none; quit exercising two years ago in 2015.        Seatbelt: 100%; no texting. Does not drive in 1027 due to seizure activity.       ADLs: no driving in 2536-6440 due to seizure activity; independent with ADLs.  Has cane for long distances      Advanced Directives: none; [name]/sister.  FULL CODE no prolonged measures.         Patient is single lives at home alone, has 1 child   Patient is right handed   Education level is 1 year of college   Caffeine consumption is 2 cups daily   Social Determinants of Health   Financial Resource Strain: Low Risk  (10/12/2021)   Overall Financial Resource Strain (CARDIA)    Difficulty of Paying Living Expenses: Not hard at all  Food Insecurity: No Food Insecurity (10/12/2021)   Hunger Vital Sign    Worried About Running Out of Food in the Last Year: Never true    Ran Out of Food in the Last Year: Never true  Transportation Needs: No Transportation Needs (10/12/2021)   PRAPARE - Administrator, Civil Service (Medical): No    Lack of Transportation (Non-Medical): No  Physical Activity: Insufficiently Active (10/12/2021)   Exercise Vital Sign    Days of Exercise per Week: 2 days    Minutes of Exercise per Session: 20 min  Stress: No Stress Concern Present (10/12/2021)   [employer] of Occupational Health - Occupational Stress Questionnaire    Feeling of Stress : Not at all  Social Connections: Moderately Integrated (10/12/2021)   Social Connection and Isolation Panel [NHANES]    Frequency of Communication with Friends and Family: Three times a week    Frequency of Social Gatherings with Friends and Family: Three times a week    Attends Religious  Services: More than 4 times per year    Active Member of Clubs or Organizations: Yes    Attends Banker Meetings: 1 to 4 times per year    Marital Status: Divorced     Family History: The patient's family history includes Arthritis in her sister; Cerebral aneurysm in her sister; Heart attack (age of onset: 31) in her father;  Heart attack (age of onset: 7) in her mother; Hyperlipidemia in her brother, brother, and brother; Hypertension in her brother, brother, brother, mother, and sister; Kidney disease in her sister; Rheum arthritis in her sister; Stroke in her mother.  ROS:   Please see the history of present illness.     All other systems reviewed and are negative.  EKGs/Labs/Other Studies Reviewed:    The following studies were reviewed today:   EKG:   12/21/2021: Sinus bradycardia, rate 54, first-degree AV block  Recent Labs: 12/17/2021: ALT 10; BUN 20; Creatinine, Ser 0.88; Hemoglobin 10.9; Platelets 210.0; Potassium 4.2; Sodium 139; TSH 1.69  Recent Lipid Panel    Component Value Date/Time   CHOL 202 (H) 12/17/2021 1603   CHOL 211 (H) 07/16/2020 1058   TRIG 118.0 12/17/2021 1603   HDL 73.60 12/17/2021 1603   HDL 72 07/16/2020 1058   CHOLHDL 3 12/17/2021 1603   VLDL 23.6 12/17/2021 1603   LDLCALC 105 (H) 12/17/2021 1603   LDLCALC 116 (H) 07/16/2020 1058    Physical Exam:    VS:  BP 120/68   Pulse (!) 54   Ht 5' 5.5" (1.664 m)   Wt 241 lb 6.4 oz (109.5 kg)   SpO2 98%   BMI 39.56 kg/m     Wt Readings from Last 3 Encounters:  12/21/21 241 lb 6.4 oz (109.5 kg)  12/17/21 239 lb 8 oz (108.6 kg)  10/29/21 238 lb (108 kg)     GEN: Well nourished, well developed in no acute distress HEENT: Normal NECK: No JVD; No carotid bruits LYMPHATICS: No lymphadenopathy CARDIAC: RRR, no murmurs, rubs, gallops RESPIRATORY:  Clear to auscultation without rales, wheezing or rhonchi  ABDOMEN: Soft, non-tender, non-distended MUSCULOSKELETAL:  No edema; No deformity   SKIN: Warm and dry NEUROLOGIC:  Alert and oriented x 3 PSYCHIATRIC:  Normal affect   ASSESSMENT:    1. Chest pain of uncertain etiology   2. DOE (dyspnea on exertion)   3. Essential hypertension   4. Hyperlipidemia, unspecified hyperlipidemia type    PLAN:    Chest pain/DOE: Description suggest possible angina, as describes central chest pressure that can occur with exertion, though not reliably brought on by exertion and can also occur at rest.  She does have significant CAD risk factors (age, former tobacco use, hypertension, hyperlipidemia, family history) -Continue ASA, statin -Prn SL NTG.  Discussed that if having chest pain lasting longer than 5 minutes should take nitroglycerin.  If does not resolve after 5 minutes can take second sublingual nitroglycerin.  If does not resolve with second nitroglycerin, should call EMS -Coronary CTA to evaluate for obstructive CAD.  Resting heart rate in 50s, will continue home carvedilol prior to study -Echocardiogram  Hypertension: On Coreg 3.125 mg twice daily and lisinopril-HCTZ 20-25 mg daily.  Appears controlled  Hyperlipidemia: On rosuvastatin 20 mg daily.  LDL 105 on 12/17/21.  We will follow-up results of coronary CTA to guide how aggressive to be in lowering cholesterol  T2DM: A1c 6.5% on 12/17/21  RTC in 71-month   Medication Adjustments/Labs and Tests Ordered: Current medicines are reviewed at length with the patient today.  Concerns regarding medicines are outlined above.  Orders Placed This Encounter  Procedures   CT CORONARY MORPH W/CTA COR W/SCORE W/CA W/CM &/OR WO/CM   EKG 12-Lead   ECHOCARDIOGRAM COMPLETE   Meds ordered this encounter  Medications   nitroGLYCERIN (NITROSTAT) 0.4 MG SL tablet    Sig: Place 1 tablet (0.4 mg total) under  the tongue every 5 (five) minutes as needed for chest pain.    Dispense:  25 tablet    Refill:  1    Patient Instructions  Medication Instructions:  Take sublingual Nitroglycerin for  chest pain as needed. *If you need a refill on your cardiac medications before your next appointment, please call your pharmacy*   Testing/Procedures: Your physician has requested that you have an echocardiogram. Echocardiography is a painless test that uses sound waves to create images of your heart. It provides your doctor with information about the size and shape of your heart and how well your heart's chambers and valves are working. This procedure takes approximately one hour. There are no restrictions for this procedure.   Your physician has requested that you have cardiac CT. Cardiac computed tomography (CT) is a painless test that uses an x-ray machine to take clear, detailed pictures of your heart. For further information please visit [email or web address] Please follow instruction sheet as given.    Follow-Up: At [facility], you and your health needs are our priority.  As part of our continuing mission to provide you with exceptional heart care, we have created designated Provider Care Teams.  These Care Teams include your primary Cardiologist (physician) and Advanced Practice Providers (APPs -  Physician Assistants and Nurse Practitioners) who all work together to provide you with the care you need, when you need it.  We recommend signing up for the patient portal called "MyChart".  Sign up information is provided on this After Visit Summary.  MyChart is used to connect with patients for Virtual Visits (Telemedicine).  Patients are able to view lab/test results, encounter notes, upcoming appointments, etc.  Non-urgent messages can be sent to your provider as well.   To learn more about what you can do with MyChart, go to [email or web address].    Your next appointment:   3 month(s)  The format for your next appointment:   In Person  Provider:   Little Ishikawa, MD {  Other instructions:    Your cardiac CT will be scheduled at one of the below locations:   [facility] [address] [place], [place] [address] [phone]   If scheduled at [facility], please arrive at the [facility] and Children's Entrance (Entrance C2) of [facility] 30 minutes prior to test start time. You can use the FREE valet parking offered at entrance C (encouraged to control the heart rate for the test)  Proceed to the [facility] Radiology Department (first floor) to check-in and test prep.  All radiology patients and guests should use entrance C2 at [facility], accessed from [facility], even though the hospital's physical address listed is [address].    If scheduled at [facility], please arrive 15 mins early for check-in and test prep.  Please follow these instructions carefully (unless otherwise directed):  On the Night Before the Test: Be sure to Drink plenty of water. Do not consume any caffeinated/decaffeinated beverages or chocolate 12 hours prior to your test. Do not take any antihistamines 12 hours prior to your test. On the Day of the Test: Drink plenty of water until 1 hour prior to the test. Do not eat any food 4 hours prior to the test. You may take your regular medications prior to the test.  HOLD lisinopril/Hydrochlorothiazide morning of the test. FEMALES- please wear underwire-free bra if available, avoid dresses & tight clothing  After the Test: Drink plenty of water. After receiving IV contrast, you may experience a mild flushed feeling. This is normal. On occasion, you may experience a mild rash up to 24 hours after the test. This is not dangerous. If this occurs, you can take Benadryl 25 mg and increase your fluid intake. If you experience trouble breathing, this can be serious. If it is severe call 911 IMMEDIATELY. If it is mild, please call our office. If you take any of these medications: Glipizide/Metformin, Avandament, Glucavance, please do not take  48 hours after completing test unless otherwise instructed.  We will call to schedule your test 2-4 weeks out understanding that some insurance companies will need an authorization prior to the service being performed.   For non-scheduling related questions, please contact the cardiac imaging nurse navigator should you have any questions/concerns: Rockwell Alexandria, Cardiac Imaging Nurse Navigator Larey Brick, Cardiac Imaging Nurse Navigator [facility] Direct Office Dial: [phone]   For scheduling needs, including cancellations and rescheduling, please call [place], [phone].      Signed, Little Ishikawa, MD  12/21/2021 12:46 PM    [facility]

## 2021-12-21 ENCOUNTER — Encounter: Payer: Self-pay | Admitting: Cardiology

## 2021-12-21 ENCOUNTER — Ambulatory Visit: Payer: Medicare Other | Admitting: Cardiology

## 2021-12-21 VITALS — BP 120/68 | HR 54 | Ht 65.5 in | Wt 241.4 lb

## 2021-12-21 DIAGNOSIS — R079 Chest pain, unspecified: Secondary | ICD-10-CM | POA: Diagnosis not present

## 2021-12-21 DIAGNOSIS — E785 Hyperlipidemia, unspecified: Secondary | ICD-10-CM | POA: Diagnosis not present

## 2021-12-21 DIAGNOSIS — I10 Essential (primary) hypertension: Secondary | ICD-10-CM

## 2021-12-21 DIAGNOSIS — R0609 Other forms of dyspnea: Secondary | ICD-10-CM | POA: Diagnosis not present

## 2021-12-21 MED ORDER — NITROGLYCERIN 0.4 MG SL SUBL: 0.4000 mg | SUBLINGUAL_TABLET | SUBLINGUAL | 1 refills | Status: AC | PRN

## 2021-12-21 NOTE — Patient Instructions (Addendum)
Medication Instructions:  Take sublingual Nitroglycerin for chest pain as needed. *If you need a refill on your cardiac medications before your next appointment, please call your pharmacy*   Testing/Procedures: Your physician has requested that you have an echocardiogram. Echocardiography is a painless test that uses sound waves to create images of your heart. It provides your doctor with information about the size and shape of your heart and how well your heart's chambers and valves are working. This procedure takes approximately one hour. There are no restrictions for this procedure.   Your physician has requested that you have cardiac CT. Cardiac computed tomography (CT) is a painless test that uses an x-ray machine to take clear, detailed pictures of your heart. For further information please visit [email or web address] Please follow instruction sheet as given.    Follow-Up: At [facility], you and your health needs are our priority.  As part of our continuing mission to provide you with exceptional heart care, we have created designated Provider Care Teams.  These Care Teams include your primary Cardiologist (physician) and Advanced Practice Providers (APPs -  Physician Assistants and Nurse Practitioners) who all work together to provide you with the care you need, when you need it.  We recommend signing up for the patient portal called "MyChart".  Sign up information is provided on this After Visit Summary.  MyChart is used to connect with patients for Virtual Visits (Telemedicine).  Patients are able to view lab/test results, encounter notes, upcoming appointments, etc.  Non-urgent messages can be sent to your provider as well.   To learn more about what you can do with MyChart, go to [email or web address].    Your next appointment:   3 month(s)  The format for your next appointment:   In Person  Provider:   Little Ishikawa, MD {  Other instructions:    Your cardiac CT  will be scheduled at one of the below locations:   [facility] [address] [place], [place] [address] [phone]   If scheduled at [facility], please arrive at the [facility] and Children's Entrance (Entrance C2) of [facility] 30 minutes prior to test start time. You can use the FREE valet parking offered at entrance C (encouraged to control the heart rate for the test)  Proceed to the [facility] Radiology Department (first floor) to check-in and test prep.  All radiology patients and guests should use entrance C2 at [facility], accessed from [facility], even though the hospital's physical address listed is [address]    If scheduled at [facility], please arrive 15 mins early for check-in and test prep.  Please follow these instructions carefully (unless otherwise directed):  On the Night Before the Test: Be sure to Drink plenty of water. Do not consume any caffeinated/decaffeinated beverages or chocolate 12 hours prior to your test. Do not take any antihistamines 12 hours prior to your test. On the Day of the Test: Drink plenty of water until 1 hour prior to the test. Do not eat any food 4 hours prior to the test. You may take your regular medications prior to the test.  HOLD lisinopril/Hydrochlorothiazide morning of the test. FEMALES- please wear underwire-free bra if available, avoid dresses & tight clothing      After the Test: Drink plenty of water. After receiving IV contrast, you may experience a mild flushed feeling. This is normal. On occasion, you may experience a mild rash up  to 24 hours after the test. This is not dangerous. If this occurs, you can take Benadryl 25 mg and increase your fluid intake. If you experience trouble breathing, this can be serious. If it is severe call 911 IMMEDIATELY. If it is mild, please call our office. If you take any of these medications:  Glipizide/Metformin, Avandament, Glucavance, please do not take 48 hours after completing test unless otherwise instructed.  We will call to schedule your test 2-4 weeks out understanding that some insurance companies will need an authorization prior to the service being performed.   For non-scheduling related questions, please contact the cardiac imaging nurse navigator should you have any questions/concerns: Rockwell Alexandria, Cardiac Imaging Nurse Navigator Larey Brick, Cardiac Imaging Nurse Navigator [facility] Direct Office Dial: [phone]   For scheduling needs, including cancellations and rescheduling, please call [place], [phone].

## 2021-12-28 DIAGNOSIS — G4733 Obstructive sleep apnea (adult) (pediatric): Secondary | ICD-10-CM | POA: Diagnosis not present

## 2022-01-01 ENCOUNTER — Ambulatory Visit (HOSPITAL_COMMUNITY): Payer: Medicare Other | Attending: Cardiology

## 2022-01-01 DIAGNOSIS — R079 Chest pain, unspecified: Secondary | ICD-10-CM | POA: Diagnosis not present

## 2022-01-01 LAB — ECHOCARDIOGRAM COMPLETE
Area-P 1/2: 2.88 cm2
S' Lateral: 3.2 cm

## 2022-01-05 ENCOUNTER — Other Ambulatory Visit: Payer: Self-pay | Admitting: Emergency Medicine

## 2022-01-05 DIAGNOSIS — I10 Essential (primary) hypertension: Secondary | ICD-10-CM

## 2022-01-12 ENCOUNTER — Encounter: Payer: Self-pay | Admitting: *Deleted

## 2022-01-12 ENCOUNTER — Telehealth (HOSPITAL_COMMUNITY): Payer: Self-pay | Admitting: Emergency Medicine

## 2022-01-12 NOTE — Telephone Encounter (Signed)
Attempted to call patient regarding upcoming cardiac CT appointment. °Left message on voicemail with name and callback number °Lyonel Morejon RN Navigator Cardiac Imaging °[facility] °[phone] Office °[phone] Cell ° °

## 2022-01-13 ENCOUNTER — Other Ambulatory Visit: Payer: Self-pay | Admitting: Cardiology

## 2022-01-13 ENCOUNTER — Ambulatory Visit (HOSPITAL_COMMUNITY)
Admission: RE | Admit: 2022-01-13 | Discharge: 2022-01-13 | Disposition: A | Discharge status: Home / Self Care | Payer: Medicare Other | Source: Ambulatory Visit | Attending: Cardiology | Admitting: Cardiology

## 2022-01-13 DIAGNOSIS — I2510 Atherosclerotic heart disease of native coronary artery without angina pectoris: Secondary | ICD-10-CM | POA: Diagnosis not present

## 2022-01-13 DIAGNOSIS — R931 Abnormal findings on diagnostic imaging of heart and coronary circulation: Secondary | ICD-10-CM

## 2022-01-13 DIAGNOSIS — R079 Chest pain, unspecified: Secondary | ICD-10-CM | POA: Insufficient documentation

## 2022-01-13 MED ORDER — NITROGLYCERIN 0.4 MG SL SUBL
SUBLINGUAL_TABLET | SUBLINGUAL | Status: AC
  Filled 2022-01-13: qty 1

## 2022-01-13 MED ORDER — NITROGLYCERIN 0.4 MG SL SUBL
0.8000 mg | SUBLINGUAL_TABLET | Freq: Once | SUBLINGUAL | Status: AC
  Administered 2022-01-13: 0.8 mg via SUBLINGUAL

## 2022-01-13 MED ORDER — IOHEXOL 350 MG/ML SOLN
100.0000 mL | Freq: Once | INTRAVENOUS | Status: AC | PRN
  Administered 2022-01-13: 100 mL via INTRAVENOUS

## 2022-01-14 ENCOUNTER — Ambulatory Visit (HOSPITAL_COMMUNITY)
Admission: RE | Admit: 2022-01-14 | Discharge: 2022-01-14 | Disposition: A | Discharge status: Home / Self Care | Payer: Medicare Other | Source: Ambulatory Visit | Attending: Cardiology | Admitting: Cardiology

## 2022-01-14 ENCOUNTER — Ambulatory Visit (HOSPITAL_BASED_OUTPATIENT_CLINIC_OR_DEPARTMENT_OTHER)
Admission: RE | Admit: 2022-01-14 | Discharge: 2022-01-14 | Disposition: A | Discharge status: Home / Self Care | Payer: Medicare Other | Source: Ambulatory Visit | Attending: Cardiology | Admitting: Cardiology

## 2022-01-14 DIAGNOSIS — R931 Abnormal findings on diagnostic imaging of heart and coronary circulation: Secondary | ICD-10-CM | POA: Insufficient documentation

## 2022-01-15 ENCOUNTER — Other Ambulatory Visit: Payer: Self-pay | Admitting: *Deleted

## 2022-01-15 MED ORDER — ROSUVASTATIN CALCIUM 40 MG PO TABS: 40.0000 mg | ORAL_TABLET | Freq: Every day | ORAL | 3 refills | Status: DC

## 2022-01-28 ENCOUNTER — Ambulatory Visit (INDEPENDENT_AMBULATORY_CARE_PROVIDER_SITE_OTHER): Payer: Medicare Other | Admitting: Emergency Medicine

## 2022-01-28 ENCOUNTER — Encounter: Payer: Self-pay | Admitting: Emergency Medicine

## 2022-01-28 VITALS — BP 134/76 | HR 53 | Temp 98.1°F | Ht 65.5 in | Wt 240.4 lb

## 2022-01-28 DIAGNOSIS — I10 Essential (primary) hypertension: Secondary | ICD-10-CM | POA: Diagnosis not present

## 2022-01-28 DIAGNOSIS — R0609 Other forms of dyspnea: Secondary | ICD-10-CM | POA: Diagnosis not present

## 2022-01-28 DIAGNOSIS — E785 Hyperlipidemia, unspecified: Secondary | ICD-10-CM

## 2022-01-28 DIAGNOSIS — G40909 Epilepsy, unspecified, not intractable, without status epilepticus: Secondary | ICD-10-CM

## 2022-01-28 NOTE — Assessment & Plan Note (Signed)
Stable.  Diet and nutrition discussed. Recently had rosuvastatin increased to 40 mg daily.

## 2022-01-28 NOTE — Patient Instructions (Signed)
Health Maintenance After Age 71 After age 71, you are at a higher risk for certain long-term diseases and infections as well as injuries from falls. Falls are a major cause of broken bones and head injuries in people who are older than age 71. Getting regular preventive care can help to keep you healthy and well. Preventive care includes getting regular testing and making lifestyle changes as recommended by your health care provider. Talk with your health care provider about: Which screenings and tests you should have. A screening is a test that checks for a disease when you have no symptoms. A diet and exercise plan that is right for you. What should I know about screenings and tests to prevent falls? Screening and testing are the best ways to find a health problem early. Early diagnosis and treatment give you the best chance of managing medical conditions that are common after age 71. Certain conditions and lifestyle choices may make you more likely to have a fall. Your health care provider may recommend: Regular vision checks. Poor vision and conditions such as cataracts can make you more likely to have a fall. If you wear glasses, make sure to get your prescription updated if your vision changes. Medicine review. Work with your health care provider to regularly review all of the medicines you are taking, including over-the-counter medicines. Ask your health care provider about any side effects that may make you more likely to have a fall. Tell your health care provider if any medicines that you take make you feel dizzy or sleepy. Strength and balance checks. Your health care provider may recommend certain tests to check your strength and balance while standing, walking, or changing positions. Foot health exam. Foot pain and numbness, as well as not wearing proper footwear, can make you more likely to have a fall. Screenings, including: Osteoporosis screening. Osteoporosis is a condition that causes  the bones to get weaker and break more easily. Blood pressure screening. Blood pressure changes and medicines to control blood pressure can make you feel dizzy. Depression screening. You may be more likely to have a fall if you have a fear of falling, feel depressed, or feel unable to do activities that you used to do. Alcohol use screening. Using too much alcohol can affect your balance and may make you more likely to have a fall. Follow these instructions at home: Lifestyle Do not drink alcohol if: Your health care provider tells you not to drink. If you drink alcohol: Limit how much you have to: 0-1 drink a day for women. 0-2 drinks a day for men. Know how much alcohol is in your drink. In the [place] one drink equals one 12 oz bottle of beer (355 mL), one 5 oz glass of wine (148 mL), or one 1 oz glass of hard liquor (44 mL). Do not use any products that contain nicotine or tobacco. These products include cigarettes, chewing tobacco, and vaping devices, such as e-cigarettes. If you need help quitting, ask your health care provider. Activity  Follow a regular exercise program to stay fit. This will help you maintain your balance. Ask your health care provider what types of exercise are appropriate for you. If you need a cane or walker, use it as recommended by your health care provider. Wear supportive shoes that have nonskid soles. Safety  Remove any tripping hazards, such as rugs, cords, and clutter. Install safety equipment such as grab bars in bathrooms and safety rails on stairs. Keep rooms and walkways   well-lit. General instructions Talk with your health care provider about your risks for falling. Tell your health care provider if: You fall. Be sure to tell your health care provider about all falls, even ones that seem minor. You feel dizzy, tiredness (fatigue), or off-balance. Take over-the-counter and prescription medicines only as told by your health care provider. These include  supplements. Eat a healthy diet and maintain a healthy weight. A healthy diet includes low-fat dairy products, low-fat (lean) meats, and fiber from whole grains, beans, and lots of fruits and vegetables. Stay current with your vaccines. Schedule regular health, dental, and eye exams. Summary Having a healthy lifestyle and getting preventive care can help to protect your health and wellness after age 71. Screening and testing are the best way to find a health problem early and help you avoid having a fall. Early diagnosis and treatment give you the best chance for managing medical conditions that are more common for people who are older than age 71. Falls are a major cause of broken bones and head injuries in people who are older than age 71. Take precautions to prevent a fall at home. Work with your health care provider to learn what changes you can make to improve your health and wellness and to prevent falls. This information is not intended to replace advice given to you by your health care provider. Make sure you discuss any questions you have with your health care provider. Document Revised: 09/08/2020 Document Reviewed: 09/08/2020 Elsevier Patient Education  2023 Elsevier Inc.  

## 2022-01-28 NOTE — Assessment & Plan Note (Signed)
Well-controlled hypertension. Continue carvedilol 3.125 mg twice a day and Zestoretic 20-25 mg daily BP Readings from Last 3 Encounters:  01/28/22 134/76  01/13/22 (!) 106/58  12/21/21 120/68

## 2022-01-28 NOTE — Assessment & Plan Note (Signed)
Stable.  Continue Tegretol 300 mg daily and Keppra 750 mg twice a day.  Sees neurologist on a regular basis.

## 2022-01-28 NOTE — Assessment & Plan Note (Addendum)
Clinically improved. Reassuring recent cardiac work-up. No signs of congestive heart failure

## 2022-01-28 NOTE — Progress Notes (Signed)
[name] [name] 71 y.o.   Chief Complaint  Patient presents with   Follow-up    56mnth f/u appt,     HISTORY OF PRESENT ILLNESS: This is a 72 y.o. female here for 62-month follow-up. Recently seen by me on 12/17/2021 with dyspnea on exertion.  Referred to cardiologist. Cardiologist office visit notes assessment and plan as follows: ASSESSMENT:     1. Chest pain of uncertain etiology   2. DOE (dyspnea on exertion)   3. Essential hypertension   4. Hyperlipidemia, unspecified hyperlipidemia type     PLAN:     Chest pain/DOE: Description suggest possible angina, as describes central chest pressure that can occur with exertion, though not reliably brought on by exertion and can also occur at rest.  She does have significant CAD risk factors (age, former tobacco use, hypertension, hyperlipidemia, family history) -Continue ASA, statin -Prn SL NTG.  Discussed that if having chest pain lasting longer than 5 minutes should take nitroglycerin.  If does not resolve after 5 minutes can take second sublingual nitroglycerin.  If does not resolve with second nitroglycerin, should call EMS -Coronary CTA to evaluate for obstructive CAD.  Resting heart rate in 50s, will continue home carvedilol prior to study -Echocardiogram   Hypertension: On Coreg 3.125 mg twice daily and lisinopril-HCTZ 20-25 mg daily.  Appears controlled   Hyperlipidemia: On rosuvastatin 20 mg daily.  LDL 105 on 12/17/21.  We will follow-up results of coronary CTA to guide how aggressive to be in lowering cholesterol   T2DM: A1c 6.5% on 12/17/21   RTC in 43-month  Echocardiogram report reviewed with patient.  Essentially normal. Coronary CTA results discussed with patient.  Unremarkable findings. She had cholesterol medication increased.  HPI   Prior to Admission medications   Medication Sig Start Date End Date Taking? Authorizing Provider  aspirin 81 MG chewable tablet Chew 81 mg by mouth daily.   Yes [provider]  carbamazepine (TEGRETOL) 200 MG tablet Take 1.5 tablets twice daily 10/29/21  Yes Suzzanne Cloud, NP  carvedilol (COREG) 3.125 MG tablet TAKE 1 TABLET(3.125 MG) BY MOUTH TWICE DAILY WITH A MEAL 01/05/22  Yes Eliyanah Elgersma, Ines Bloomer, MD  cholecalciferol (VITAMIN D3) 25 MCG (1000 UT) tablet Take 1,000 Units by mouth daily.   Yes [provider]  escitalopram (LEXAPRO) 10 MG tablet TAKE 1 TABLET(10 MG) BY MOUTH AT BEDTIME 09/15/21  Yes Aubryana Vittorio, Ines Bloomer, MD  levETIRAcetam (KEPPRA) 750 MG tablet TAKE 2 TABLETS(1500 MG) BY MOUTH TWICE DAILY 10/29/21  Yes Suzzanne Cloud, NP  lisinopril-hydrochlorothiazide (ZESTORETIC) 20-25 MG tablet TAKE 1 TABLET BY MOUTH DAILY 09/15/21  Yes Nguyet Mercer, Ines Bloomer, MD  metFORMIN (GLUCOPHAGE) 500 MG tablet TAKE 1 TABLET(500 MG) BY MOUTH TWICE DAILY WITH A MEAL 07/12/21  Yes Kwali Wrinkle, Ines Bloomer, MD  Multiple Vitamins-Minerals (MULTIVITAMIN PO) Take 1 tablet by mouth daily.   Yes [provider]  nitroGLYCERIN (NITROSTAT) 0.4 MG SL tablet Place 1 tablet (0.4 mg total) under the tongue every 5 (five) minutes as needed for chest pain. 12/21/21  Yes Donato Heinz, MD  rosuvastatin (CRESTOR) 40 MG tablet Take 1 tablet (40 mg total) by mouth daily. 01/15/22  Yes Donato Heinz, MD    No Known Allergies  Patient Active Problem List   Diagnosis Date Noted   Dyspnea on exertion 12/17/2021   Nonspecific chest pain 12/17/2021   Seizure disorder ([place]) 05/28/2019   Prediabetes 05/28/2019   Staring episodes 11/24/2017   OSA (obstructive sleep  apnea) 11/30/2016   Chronic insomnia 10/13/2016   Transient cerebral ischemia 08/26/2016   Transient memory loss 08/26/2016   Morbid obesity (HCC) 05/31/2016   Glucose intolerance (impaired glucose tolerance) 05/18/2016   Pronation deformity of ankle, acquired 04/19/2014   Generalized anxiety disorder 03/15/2014   Memory difficulty 03/14/2014   Demand myocardial ischemic related  infarction 01/23/2014    Class: Diagnosis of   Depression, reactive 10/12/2012   Chronic neutropenia (HCC) 04/14/2012   Dyslipidemia 10/20/2011   HTN (hypertension) 10/20/2011    Past Medical History:  Diagnosis Date   Anxiety    Cataract    Chronic insomnia 10/13/2016   Depression    Gait disorder 03/14/2014   HTN (hypertension)    Hx of cardiovascular stress test    Lexiscan Myoview (1/16):  No ischemia, EF 68%, breast atten; Low Risk   Hyperlipemia    Hyperlipidemia    Phreesia 07/18/2020   Hypertension    Phreesia 07/18/2020   Memory difficulty 03/14/2014   Meningitis due to bacteria 01-18-14   OSA (obstructive sleep apnea) 11/30/2016   Seizures (HCC)    Sleep apnea    Phreesia 07/18/2020   TIA (transient ischemic attack)     Past Surgical History:  Procedure Laterality Date   NO PAST SURGERIES      Social History   Socioeconomic History   Marital status: Divorced    Spouse name: Not on file   Number of children: 1   Years of education: college   Highest education level: Some college, no degree  Occupational History   Occupation: Retired Financial planner: [employer]  Tobacco Use   Smoking status: Former    Packs/day: 0.50    Years: 40.00    Total pack years: 20.00    Types: Cigarettes    Quit date: 07/09/2011    Years since quitting: 10.5   Smokeless tobacco: Never  Vaping Use   Vaping Use: Never used  Substance and Sexual Activity   Alcohol use: Yes    Alcohol/week: 0.0 standard drinks of alcohol    Comment: Rare   Drug use: No   Sexual activity: Never    Birth control/protection: Abstinence  Other Topics Concern   Not on file  Social History Narrative   Marital status: divorced x 35 years; not dating; not interested in 2019.      Children: 1 child/son (44); 2 grandchildren; [place]      Lives:  Alone in [place] in [place].      Employment: retired 2008; Administrator, arts.      Tobacco: quit smoking in 2013.  Smoked x many years        Alcohol: none; holidays only      Exercise: none; quit exercising two years ago in 2015.        Seatbelt: 100%; no texting. Does not drive in [address] due to seizure activity.       ADLs: no driving in [place] due to seizure activity; independent with ADLs.  Has cane for long distances      Advanced Directives: none; [name]/sister.  FULL CODE no prolonged measures.         Patient is single lives at home alone, has 1 child   Patient is right handed   Education level is 1 year of college   Caffeine consumption is 2 cups daily   Social Determinants of Health   Financial Resource Strain: Low Risk  (10/12/2021)   Overall Financial Resource Strain (  CARDIA)    Difficulty of Paying Living Expenses: Not hard at all  Food Insecurity: No Food Insecurity (10/12/2021)   Hunger Vital Sign    Worried About Running Out of Food in the Last Year: Never true    Ran Out of Food in the Last Year: Never true  Transportation Needs: No Transportation Needs (10/12/2021)   PRAPARE - Administrator, Civil Service (Medical): No    Lack of Transportation (Non-Medical): No  Physical Activity: Insufficiently Active (10/12/2021)   Exercise Vital Sign    Days of Exercise per Week: 2 days    Minutes of Exercise per Session: 20 min  Stress: No Stress Concern Present (10/12/2021)   [employer] of Occupational Health - Occupational Stress Questionnaire    Feeling of Stress : Not at all  Social Connections: Moderately Integrated (10/12/2021)   Social Connection and Isolation Panel [NHANES]    Frequency of Communication with Friends and Family: Three times a week    Frequency of Social Gatherings with Friends and Family: Three times a week    Attends Religious Services: More than 4 times per year    Active Member of Clubs or Organizations: Yes    Attends Banker Meetings: 1 to 4 times per year    Marital Status: Divorced  Intimate Partner Violence: Not At Risk (10/12/2021)    Humiliation, Afraid, Rape, and Kick questionnaire    Fear of Current or Ex-Partner: No    Emotionally Abused: No    Physically Abused: No    Sexually Abused: No    Family History  Problem Relation Age of Onset   Heart attack Mother 81   Stroke Mother    Hypertension Mother    Heart attack Father 80   Cerebral aneurysm Sister    Hypertension Sister    Kidney disease Sister        ESRD; HD in last year   Hypertension Brother    Hyperlipidemia Brother    Rheum arthritis Sister    Arthritis Sister    Hypertension Brother    Hyperlipidemia Brother    Hyperlipidemia Brother    Hypertension Brother      Review of Systems  Constitutional: Negative.  Negative for chills and fever.  HENT: Negative.  Negative for congestion and sore throat.   Respiratory:  Positive for shortness of breath (Dyspnea on exertion). Negative for cough.   Cardiovascular: Negative.  Negative for chest pain and palpitations.  Gastrointestinal:  Negative for abdominal pain, diarrhea, nausea and vomiting.  Genitourinary:  Negative for dysuria.  Musculoskeletal: Negative.   Skin: Negative.  Negative for rash.  Neurological:  Negative for dizziness and headaches.  All other systems reviewed and are negative.   Today's Vitals   01/28/22 1017  BP: 134/76  Pulse: (!) 53  Temp: 98.1 F (36.7 C)  TempSrc: Oral  SpO2: 97%  Weight: 240 lb 6 oz (109 kg)  Height: 5' 5.5" (1.664 m)   Body mass index is 39.39 kg/m. Wt Readings from Last 3 Encounters:  01/28/22 240 lb 6 oz (109 kg)  12/21/21 241 lb 6.4 oz (109.5 kg)  12/17/21 239 lb 8 oz (108.6 kg)    Physical Exam Constitutional:      Appearance: Normal appearance.  HENT:     Head: Normocephalic.  Eyes:     Extraocular Movements: Extraocular movements intact.  Cardiovascular:     Rate and Rhythm: Normal rate and regular rhythm.     Pulses: Normal pulses.  Heart sounds: Normal heart sounds.  Pulmonary:     Effort: Pulmonary effort is normal.      Breath sounds: Normal breath sounds.  Musculoskeletal:     Cervical back: No tenderness.  Lymphadenopathy:     Cervical: No cervical adenopathy.  Skin:    General: Skin is warm and dry.     Capillary Refill: Capillary refill takes less than 2 seconds.  Neurological:     General: No focal deficit present.     Mental Status: She is alert and oriented to person, place, and time.  Psychiatric:        Mood and Affect: Mood normal.        Behavior: Behavior normal.      ASSESSMENT & PLAN: A total of 46 minutes was spent with the patient and counseling/coordination of care regarding preparing for this visit, review of most recent office visit notes, review of cardiologist most recent office visit notes, review of cardiac CT reports, review of echocardiogram report, review of multiple chronic medical conditions and their management, review of all medications, education on nutrition, prognosis, documentation, and need for follow-up  Problem List Items Addressed This Visit       Cardiovascular and Mediastinum   HTN (hypertension) - Primary (Chronic)    Well-controlled hypertension. Continue carvedilol 3.125 mg twice a day and Zestoretic 20-25 mg daily BP Readings from Last 3 Encounters:  01/28/22 134/76  01/13/22 (!) 106/58  12/21/21 120/68           Nervous and Auditory   Seizure disorder (HCC)    Stable.  Continue Tegretol 300 mg daily and Keppra 750 mg twice a day.  Sees neurologist on a regular basis.        Other   Dyslipidemia    Stable.  Diet and nutrition discussed. Recently had rosuvastatin increased to 40 mg daily.      Dyspnea on exertion    Clinically improved. Reassuring recent cardiac work-up. No signs of congestive heart failure      Patient Instructions  Health Maintenance After Age 34 After age 64, you are at a higher risk for certain long-term diseases and infections as well as injuries from falls. Falls are a major cause of broken bones and head  injuries in people who are older than age 101. Getting regular preventive care can help to keep you healthy and well. Preventive care includes getting regular testing and making lifestyle changes as recommended by your health care provider. Talk with your health care provider about: Which screenings and tests you should have. A screening is a test that checks for a disease when you have no symptoms. A diet and exercise plan that is right for you. What should I know about screenings and tests to prevent falls? Screening and testing are the best ways to find a health problem early. Early diagnosis and treatment give you the best chance of managing medical conditions that are common after age 1. Certain conditions and lifestyle choices may make you more likely to have a fall. Your health care provider may recommend: Regular vision checks. Poor vision and conditions such as cataracts can make you more likely to have a fall. If you wear glasses, make sure to get your prescription updated if your vision changes. Medicine review. Work with your health care provider to regularly review all of the medicines you are taking, including over-the-counter medicines. Ask your health care provider about any side effects that may make you more likely to have  a fall. Tell your health care provider if any medicines that you take make you feel dizzy or sleepy. Strength and balance checks. Your health care provider may recommend certain tests to check your strength and balance while standing, walking, or changing positions. Foot health exam. Foot pain and numbness, as well as not wearing proper footwear, can make you more likely to have a fall. Screenings, including: Osteoporosis screening. Osteoporosis is a condition that causes the bones to get weaker and break more easily. Blood pressure screening. Blood pressure changes and medicines to control blood pressure can make you feel dizzy. Depression screening. You may be more  likely to have a fall if you have a fear of falling, feel depressed, or feel unable to do activities that you used to do. Alcohol use screening. Using too much alcohol can affect your balance and may make you more likely to have a fall. Follow these instructions at home: Lifestyle Do not drink alcohol if: Your health care provider tells you not to drink. If you drink alcohol: Limit how much you have to: 0-1 drink a day for women. 0-2 drinks a day for men. Know how much alcohol is in your drink. In the [place] one drink equals one 12 oz bottle of beer (355 mL), one 5 oz glass of wine (148 mL), or one 1 oz glass of hard liquor (44 mL). Do not use any products that contain nicotine or tobacco. These products include cigarettes, chewing tobacco, and vaping devices, such as e-cigarettes. If you need help quitting, ask your health care provider. Activity  Follow a regular exercise program to stay fit. This will help you maintain your balance. Ask your health care provider what types of exercise are appropriate for you. If you need a cane or walker, use it as recommended by your health care provider. Wear supportive shoes that have nonskid soles. Safety  Remove any tripping hazards, such as rugs, cords, and clutter. Install safety equipment such as grab bars in bathrooms and safety rails on stairs. Keep rooms and walkways well-lit. General instructions Talk with your health care provider about your risks for falling. Tell your health care provider if: You fall. Be sure to tell your health care provider about all falls, even ones that seem minor. You feel dizzy, tiredness (fatigue), or off-balance. Take over-the-counter and prescription medicines only as told by your health care provider. These include supplements. Eat a healthy diet and maintain a healthy weight. A healthy diet includes low-fat dairy products, low-fat (lean) meats, and fiber from whole grains, beans, and lots of fruits and  vegetables. Stay current with your vaccines. Schedule regular health, dental, and eye exams. Summary Having a healthy lifestyle and getting preventive care can help to protect your health and wellness after age 50. Screening and testing are the best way to find a health problem early and help you avoid having a fall. Early diagnosis and treatment give you the best chance for managing medical conditions that are more common for people who are older than age 25. Falls are a major cause of broken bones and head injuries in people who are older than age 50. Take precautions to prevent a fall at home. Work with your health care provider to learn what changes you can make to improve your health and wellness and to prevent falls. This information is not intended to replace advice given to you by your health care provider. Make sure you discuss any questions you have with your health care  provider. Document Revised: 09/08/2020 Document Reviewed: 09/08/2020 Elsevier Patient Education  2023 Elsevier Inc.    Edwina Barth, MD [facility]

## 2022-03-05 ENCOUNTER — Other Ambulatory Visit: Payer: Self-pay | Admitting: Emergency Medicine

## 2022-03-29 DIAGNOSIS — G4733 Obstructive sleep apnea (adult) (pediatric): Secondary | ICD-10-CM | POA: Diagnosis not present

## 2022-03-31 ENCOUNTER — Telehealth: Payer: Self-pay | Admitting: Emergency Medicine

## 2022-03-31 NOTE — Telephone Encounter (Signed)
Pt states she is still having heart palpitations. Went to cardio care, but they didn't find any blockages, [place] increased cholesterol meds.  Pt is worried she is having thyroid problems-itchy, hair loss, tiredness, rashes at neck hairline, going horse, knot in fingers.   Pt is requesting a call from Sagardia's CMA:  Please call Pt at: [phone]

## 2022-03-31 NOTE — Telephone Encounter (Signed)
Multitude of symptoms, these can be anything.  Hard to diagnose over the phone.  Recommend office visit.  Thanks.

## 2022-03-31 NOTE — Telephone Encounter (Signed)
Patient is scheduled to be seen on 04/08/22

## 2022-04-08 ENCOUNTER — Ambulatory Visit (INDEPENDENT_AMBULATORY_CARE_PROVIDER_SITE_OTHER): Payer: Medicare Other | Admitting: Emergency Medicine

## 2022-04-08 ENCOUNTER — Encounter: Payer: Self-pay | Admitting: Emergency Medicine

## 2022-04-08 VITALS — BP 110/60 | HR 58 | Temp 98.7°F

## 2022-04-08 DIAGNOSIS — D709 Neutropenia, unspecified: Secondary | ICD-10-CM

## 2022-04-08 DIAGNOSIS — I10 Essential (primary) hypertension: Secondary | ICD-10-CM | POA: Diagnosis not present

## 2022-04-08 DIAGNOSIS — G4733 Obstructive sleep apnea (adult) (pediatric): Secondary | ICD-10-CM | POA: Diagnosis not present

## 2022-04-08 DIAGNOSIS — R002 Palpitations: Secondary | ICD-10-CM | POA: Insufficient documentation

## 2022-04-08 DIAGNOSIS — R5383 Other fatigue: Secondary | ICD-10-CM | POA: Insufficient documentation

## 2022-04-08 DIAGNOSIS — R531 Weakness: Secondary | ICD-10-CM | POA: Diagnosis not present

## 2022-04-08 DIAGNOSIS — R7303 Prediabetes: Secondary | ICD-10-CM

## 2022-04-08 DIAGNOSIS — G40909 Epilepsy, unspecified, not intractable, without status epilepticus: Secondary | ICD-10-CM

## 2022-04-08 DIAGNOSIS — E785 Hyperlipidemia, unspecified: Secondary | ICD-10-CM

## 2022-04-08 DIAGNOSIS — F411 Generalized anxiety disorder: Secondary | ICD-10-CM

## 2022-04-08 LAB — CBC WITH DIFFERENTIAL/PLATELET
Basophils Absolute: 0 10*3/uL (ref 0.0–0.1)
Basophils Relative: 0.7 % (ref 0.0–3.0)
Eosinophils Absolute: 0.1 10*3/uL (ref 0.0–0.7)
Eosinophils Relative: 3.4 % (ref 0.0–5.0)
HCT: 33.2 % — ABNORMAL LOW (ref 36.0–46.0)
Hemoglobin: 11 g/dL — ABNORMAL LOW (ref 12.0–15.0)
Lymphocytes Relative: 24.7 % (ref 12.0–46.0)
Lymphs Abs: 0.7 10*3/uL (ref 0.7–4.0)
MCHC: 33 g/dL (ref 30.0–36.0)
MCV: 80.1 fl (ref 78.0–100.0)
Monocytes Absolute: 0.4 10*3/uL (ref 0.1–1.0)
Monocytes Relative: 12.1 % — ABNORMAL HIGH (ref 3.0–12.0)
Neutro Abs: 1.8 10*3/uL (ref 1.4–7.7)
Neutrophils Relative %: 59.1 % (ref 43.0–77.0)
Platelets: 201 10*3/uL (ref 150.0–400.0)
RBC: 4.14 Mil/uL (ref 3.87–5.11)
RDW: 13.2 % (ref 11.5–15.5)
WBC: 3 10*3/uL — ABNORMAL LOW (ref 4.0–10.5)

## 2022-04-08 LAB — COMPREHENSIVE METABOLIC PANEL
ALT: 11 U/L (ref 0–35)
AST: 14 U/L (ref 0–37)
Albumin: 4.2 g/dL (ref 3.5–5.2)
Alkaline Phosphatase: 87 U/L (ref 39–117)
BUN: 25 mg/dL — ABNORMAL HIGH (ref 6–23)
CO2: 32 mEq/L (ref 19–32)
Calcium: 9.2 mg/dL (ref 8.4–10.5)
Chloride: 104 mEq/L (ref 96–112)
Creatinine, Ser: 1 mg/dL (ref 0.40–1.20)
GFR: 56.86 mL/min — ABNORMAL LOW (ref >60.00)
Glucose, Bld: 99 mg/dL (ref 70–99)
Potassium: 4.4 mEq/L (ref 3.5–5.1)
Sodium: 142 mEq/L (ref 135–145)
Total Bilirubin: 0.3 mg/dL (ref 0.2–1.2)
Total Protein: 6.5 g/dL (ref 6.0–8.3)

## 2022-04-08 LAB — SEDIMENTATION RATE: Sed Rate: 34 mm/hr — ABNORMAL HIGH (ref 0–30)

## 2022-04-08 LAB — VITAMIN B12: Vitamin B-12: 413 pg/mL (ref 211–911)

## 2022-04-08 LAB — VITAMIN D 25 HYDROXY (VIT D DEFICIENCY, FRACTURES): VITD: 51.3 ng/mL (ref 30.00–100.00)

## 2022-04-08 NOTE — Assessment & Plan Note (Signed)
Symptomatic and affecting quality of life. Will need continuous monitoring for couple weeks.  Recommended Zio patch. Has cardiology follow-up appointment next Monday

## 2022-04-08 NOTE — Assessment & Plan Note (Signed)
On CPAP treatment.  May be contributing to symptoms. Needs follow-up with neurology

## 2022-04-08 NOTE — Assessment & Plan Note (Signed)
Well-controlled hypertension. Continue Zestoretic 20-25 mg daily and carvedilol 3.125 mg twice a day BP Readings from Last 3 Encounters:  04/08/22 110/60  01/28/22 134/76  01/13/22 (!) 106/58

## 2022-04-08 NOTE — Assessment & Plan Note (Signed)
Differential diagnosis discussed.  Needs workup Multifactorial

## 2022-04-08 NOTE — Assessment & Plan Note (Signed)
Multifactorial.  Differential diagnosis discussed. Blood work done today Most recent blood work results reviewed with patient today. No recent viral illness Medication side effects possible Emotional component a possibility.  On Lexapro 10 mg daily. Doubt thyroid condition.

## 2022-04-08 NOTE — Progress Notes (Signed)
[name] [name] 71 y.o.   Chief Complaint  Patient presents with   Acute Visit    Heart palp when doing simple task, fatigue all the time, horse, has been going on since August. And noticed getting worse with the past couple of weeks.    HISTORY OF PRESENT ILLNESS: This is a 71 y.o. female complaining of feeling tired more of the time Complaining of palpitations mostly when doing simple tasks.  No syncope.  No chest pain.  Seen by cardiologist last August.  Unremarkable workup.  Has follow-up appointment next Monday.  On carvedilol 3.125 mg twice a day.  Unremarkable echocardiogram.  Unremarkable cardiac CT scan. History of hypertension on Zestoretic Dyslipidemia on rosuvastatin Concerned about thyroid disorder.  Normal TSH last August. Has history of seizure disorder on Keppra and Tegretol History of prediabetes on metformin   HPI   Prior to Admission medications   Medication Sig Start Date End Date Taking? Authorizing Provider  aspirin 81 MG chewable tablet Chew 81 mg by mouth daily.   Yes [provider]  carbamazepine (TEGRETOL) 200 MG tablet Take 1.5 tablets twice daily 10/29/21  Yes Glean Salvo, NP  carvedilol (COREG) 3.125 MG tablet TAKE 1 TABLET(3.125 MG) BY MOUTH TWICE DAILY WITH A MEAL 01/05/22  Yes Octavia Velador, Eilleen Kempf, MD  cholecalciferol (VITAMIN D3) 25 MCG (1000 UT) tablet Take 1,000 Units by mouth daily.   Yes [provider]  escitalopram (LEXAPRO) 10 MG tablet TAKE 1 TABLET(10 MG) BY MOUTH AT BEDTIME 03/08/22  Yes Joscelyne Renville, Eilleen Kempf, MD  levETIRAcetam (KEPPRA) 750 MG tablet TAKE 2 TABLETS(1500 MG) BY MOUTH TWICE DAILY 10/29/21  Yes Glean Salvo, NP  lisinopril-hydrochlorothiazide (ZESTORETIC) 20-25 MG tablet TAKE 1 TABLET BY MOUTH DAILY 03/08/22  Yes Rachael Zapanta, Eilleen Kempf, MD  metFORMIN (GLUCOPHAGE) 500 MG tablet TAKE 1 TABLET(500 MG) BY MOUTH TWICE DAILY WITH A MEAL 07/12/21  Yes Jonatan Wilsey, Eilleen Kempf, MD  Multiple Vitamins-Minerals  (MULTIVITAMIN PO) Take 1 tablet by mouth daily.   Yes [provider]  nitroGLYCERIN (NITROSTAT) 0.4 MG SL tablet Place 1 tablet (0.4 mg total) under the tongue every 5 (five) minutes as needed for chest pain. 12/21/21  Yes Little Ishikawa, MD  rosuvastatin (CRESTOR) 40 MG tablet Take 1 tablet (40 mg total) by mouth daily. 01/15/22  Yes Little Ishikawa, MD    No Known Allergies  Patient Active Problem List   Diagnosis Date Noted   Dyspnea on exertion 12/17/2021   Seizure disorder (HCC) 05/28/2019   Prediabetes 05/28/2019   Staring episodes 11/24/2017   OSA (obstructive sleep apnea) 11/30/2016   Chronic insomnia 10/13/2016   Transient cerebral ischemia 08/26/2016   Transient memory loss 08/26/2016   Morbid obesity (HCC) 05/31/2016   Glucose intolerance (impaired glucose tolerance) 05/18/2016   Pronation deformity of ankle, acquired 04/19/2014   Generalized anxiety disorder 03/15/2014   Memory difficulty 03/14/2014   Demand myocardial ischemic related infarction 01/23/2014    Class: Diagnosis of   Depression, reactive 10/12/2012   Chronic neutropenia (HCC) 04/14/2012   Dyslipidemia 10/20/2011   HTN (hypertension) 10/20/2011    Past Medical History:  Diagnosis Date   Anxiety    Cataract    Chronic insomnia 10/13/2016   Depression    Gait disorder 03/14/2014   HTN (hypertension)    Hx of cardiovascular stress test    Lexiscan Myoview (1/16):  No ischemia, EF 68%, breast atten; Low Risk   Hyperlipemia    Hyperlipidemia    Phreesia  07/18/2020   Hypertension    Phreesia 07/18/2020   Memory difficulty 03/14/2014   Meningitis due to bacteria 01-18-14   OSA (obstructive sleep apnea) 11/30/2016   Seizures (HCC)    Sleep apnea    Phreesia 07/18/2020   TIA (transient ischemic attack)     Past Surgical History:  Procedure Laterality Date   NO PAST SURGERIES      Social History   Socioeconomic History   Marital status: Divorced    Spouse name: Not  on file   Number of children: 1   Years of education: college   Highest education level: Some college, no degree  Occupational History   Occupation: Retired Financial planner: [employer]  Tobacco Use   Smoking status: Former    Packs/day: 0.50    Years: 40.00    Total pack years: 20.00    Types: Cigarettes    Quit date: 07/09/2011    Years since quitting: 10.7   Smokeless tobacco: Never  Vaping Use   Vaping Use: Never used  Substance and Sexual Activity   Alcohol use: Yes    Alcohol/week: 0.0 standard drinks of alcohol    Comment: Rare   Drug use: No   Sexual activity: Never    Birth control/protection: Abstinence  Other Topics Concern   Not on file  Social History Narrative   Marital status: divorced x 35 years; not dating; not interested in 2019.      Children: 1 child/son (44); 2 grandchildren; [place]      Lives:  Alone in [place] in [place].      Employment: retired 2008; Administrator, arts.      Tobacco: quit smoking in 2013.  Smoked x many years       Alcohol: none; holidays only      Exercise: none; quit exercising two years ago in 2015.        Seatbelt: 100%; no texting. Does not drive in [address] due to seizure activity.       ADLs: no driving in [address] due to seizure activity; independent with ADLs.  Has cane for long distances      Advanced Directives: none; [name]/sister.  FULL CODE no prolonged measures.         Patient is single lives at home alone, has 1 child   Patient is right handed   Education level is 1 year of college   Caffeine consumption is 2 cups daily   Social Determinants of Health   Financial Resource Strain: Low Risk  (10/12/2021)   Overall Financial Resource Strain (CARDIA)    Difficulty of Paying Living Expenses: Not hard at all  Food Insecurity: No Food Insecurity (10/12/2021)   Hunger Vital Sign    Worried About Running Out of Food in the Last Year: Never true    Ran Out of Food in the Last Year: Never true   Transportation Needs: No Transportation Needs (10/12/2021)   PRAPARE - Administrator, Civil Service (Medical): No    Lack of Transportation (Non-Medical): No  Physical Activity: Insufficiently Active (10/12/2021)   Exercise Vital Sign    Days of Exercise per Week: 2 days    Minutes of Exercise per Session: 20 min  Stress: No Stress Concern Present (10/12/2021)   [employer] of Occupational Health - Occupational Stress Questionnaire    Feeling of Stress : Not at all  Social Connections: Moderately Integrated (10/12/2021)   Social Connection and Isolation Panel [NHANES]  Frequency of Communication with Friends and Family: Three times a week    Frequency of Social Gatherings with Friends and Family: Three times a week    Attends Religious Services: More than 4 times per year    Active Member of Clubs or Organizations: Yes    Attends Banker Meetings: 1 to 4 times per year    Marital Status: Divorced  Intimate Partner Violence: Not At Risk (10/12/2021)   Humiliation, Afraid, Rape, and Kick questionnaire    Fear of Current or Ex-Partner: No    Emotionally Abused: No    Physically Abused: No    Sexually Abused: No    Family History  Problem Relation Age of Onset   Heart attack Mother 52   Stroke Mother    Hypertension Mother    Heart attack Father 35   Cerebral aneurysm Sister    Hypertension Sister    Kidney disease Sister        ESRD; HD in last year   Hypertension Brother    Hyperlipidemia Brother    Rheum arthritis Sister    Arthritis Sister    Hypertension Brother    Hyperlipidemia Brother    Hyperlipidemia Brother    Hypertension Brother      Review of Systems  Constitutional:  Positive for malaise/fatigue. Negative for chills and fever.  HENT: Negative.  Negative for congestion and sore throat.   Respiratory: Negative.  Negative for cough and shortness of breath.   Cardiovascular:  Positive for palpitations. Negative for chest pain.   Gastrointestinal:  Negative for abdominal pain, nausea and vomiting.  Genitourinary: Negative.   Musculoskeletal:  Negative for myalgias.  Skin: Negative.  Negative for rash.  Neurological: Negative.  Negative for dizziness and headaches.  All other systems reviewed and are negative.  Today's Vitals   04/08/22 0843  BP: 110/60  Pulse: (!) 58  Temp: 98.7 F (37.1 C)  TempSrc: Oral  SpO2: 96%   There is no height or weight on file to calculate BMI.   Physical Exam Vitals reviewed.  Constitutional:      Appearance: Normal appearance. She is obese.  HENT:     Head: Normocephalic.  Eyes:     Extraocular Movements: Extraocular movements intact.     Conjunctiva/sclera: Conjunctivae normal.     Pupils: Pupils are equal, round, and reactive to light.  Cardiovascular:     Rate and Rhythm: Normal rate and regular rhythm.     Pulses: Normal pulses.     Heart sounds: Normal heart sounds.  Pulmonary:     Effort: Pulmonary effort is normal.     Breath sounds: Normal breath sounds.  Musculoskeletal:     Cervical back: No tenderness.  Lymphadenopathy:     Cervical: No cervical adenopathy.  Skin:    General: Skin is warm and dry.  Neurological:     General: No focal deficit present.     Mental Status: She is alert and oriented to person, place, and time.  Psychiatric:        Mood and Affect: Mood normal.        Behavior: Behavior normal.      ASSESSMENT & PLAN: A total of 49 minutes was spent with the patient and counseling/coordination of care regarding preparing for this visit, review of most recent office visit notes, review of multiple chronic medical conditions under management, review of all medications, review of most recent blood work results, prognosis, documentation, and need for follow-up  Problem List  Items Addressed This Visit       Cardiovascular and Mediastinum   HTN (hypertension) (Chronic)    Well-controlled hypertension. Continue Zestoretic 20-25 mg  daily and carvedilol 3.125 mg twice a day BP Readings from Last 3 Encounters:  04/08/22 110/60  01/28/22 134/76  01/13/22 (!) 106/58          Respiratory   OSA (obstructive sleep apnea)    On CPAP treatment.  May be contributing to symptoms. Needs follow-up with neurology        Nervous and Auditory   Seizure disorder (HCC)    Stable. On Keppra and Tegretol. Symptoms may be related to medication side effects and or underlying seizure disorder condition.  Recommend follow-up with neurology        Other   Dyslipidemia    Stable.  Continue rosuvastatin 40 mg daily.      Chronic neutropenia (HCC)    Stable.  Negative workup in the past.      Generalized anxiety disorder    Continue Lexapro 10 mg daily.      Prediabetes    Stable.  Diet and nutrition discussed. Continues metformin 500 mg twice a day Lab Results  Component Value Date   HGBA1C 6.5 12/17/2021        Palpitation - Primary    Symptomatic and affecting quality of life. Will need continuous monitoring for couple weeks.  Recommended Zio patch. Has cardiology follow-up appointment next Monday      Relevant Orders   Thyroid Panel With TSH   Vitamin B12   VITAMIN D 25 Hydroxy (Vit-D Deficiency, Fractures)   Comprehensive metabolic panel   CBC with Differential/Platelet   Sedimentation rate   Tiredness    Multifactorial.  Differential diagnosis discussed. Blood work done today Most recent blood work results reviewed with patient today. No recent viral illness Medication side effects possible Emotional component a possibility.  On Lexapro 10 mg daily. Doubt thyroid condition.      Relevant Orders   Thyroid Panel With TSH   Vitamin B12   VITAMIN D 25 Hydroxy (Vit-D Deficiency, Fractures)   Comprehensive metabolic panel   CBC with Differential/Platelet   Sedimentation rate   General weakness    Differential diagnosis discussed.  Needs workup Multifactorial      Relevant Orders   Thyroid  Panel With TSH   Vitamin B12   VITAMIN D 25 Hydroxy (Vit-D Deficiency, Fractures)   Comprehensive metabolic panel   CBC with Differential/Platelet   Sedimentation rate   Patient Instructions  Palpitations Palpitations are feelings that your heartbeat is not normal. Your heartbeat may feel like it is: Uneven (irregular). Faster than normal. Fluttering. Skipping a beat. This is usually not a serious problem. However, a doctor will do tests and check your medical history to make sure that you do not have a serious heart problem. Follow these instructions at home: Watch for any changes in your condition. Tell your doctor about any changes. Take these actions to help manage your symptoms: Eating and drinking Follow instructions from your doctor about things to eat and drink. You may be told to avoid these things: Drinks that have caffeine in them, such as coffee, tea, soft drinks, and energy drinks. Chocolate. Alcohol. Diet pills. Lifestyle     Try to lower your stress. These things can help you relax: Yoga. Deep breathing and meditation. Guided imagery. This is using words and images to create positive thoughts. Exercise, including swimming, jogging, and walking. Tell your doctor  if you have more abnormal heartbeats when you are active. If you have chest pain or feel short of breath with exercise, do not keep doing the exercise until you are seen by your doctor. Biofeedback. This is using your mind to control things in your body, such as your heartbeat. Get plenty of rest and sleep. Keep a regular bed time. Do not use drugs, such as cocaine or ecstasy. Do not use marijuana. Do not smoke or use any products that contain nicotine or tobacco. If you need help quitting, ask your doctor. General instructions Take over-the-counter and prescription medicines only as told by your doctor. Keep all follow-up visits. You may need more tests if palpitations do not go away or get worse. Contact  a doctor if: You keep having fast or uneven heartbeats for a long time. Your symptoms happen more often. Get help right away if: You have chest pain. You feel short of breath. You have a very bad headache. You feel dizzy. You faint. These symptoms may be an emergency. Get help right away. Call your local emergency services (911 in the [place] Do not wait to see if the symptoms will go away. Do not drive yourself to the hospital. Summary Palpitations are feelings that your heartbeat is uneven or faster than normal. It may feel like your heart is fluttering or skipping a beat. Avoid food and drinks that may cause this condition. These include caffeine, chocolate, and alcohol. Try to lower your stress. Do not smoke or use drugs. Get help right away if you faint, feel dizzy, feel short of breath, have chest pain, or have a very bad headache. This information is not intended to replace advice given to you by your health care provider. Make sure you discuss any questions you have with your health care provider. Document Revised: 09/10/2020 Document Reviewed: 09/10/2020 Elsevier Patient Education  2023 Elsevier Inc.    Edwina Barth, MD [facility]

## 2022-04-08 NOTE — Patient Instructions (Signed)
Palpitations Palpitations are feelings that your heartbeat is not normal. Your heartbeat may feel like it is: Uneven (irregular). Faster than normal. Fluttering. Skipping a beat. This is usually not a serious problem. However, a doctor will do tests and check your medical history to make sure that you do not have a serious heart problem. Follow these instructions at home: Watch for any changes in your condition. Tell your doctor about any changes. Take these actions to help manage your symptoms: Eating and drinking Follow instructions from your doctor about things to eat and drink. You may be told to avoid these things: Drinks that have caffeine in them, such as coffee, tea, soft drinks, and energy drinks. Chocolate. Alcohol. Diet pills. Lifestyle     Try to lower your stress. These things can help you relax: Yoga. Deep breathing and meditation. Guided imagery. This is using words and images to create positive thoughts. Exercise, including swimming, jogging, and walking. Tell your doctor if you have more abnormal heartbeats when you are active. If you have chest pain or feel short of breath with exercise, do not keep doing the exercise until you are seen by your doctor. Biofeedback. This is using your mind to control things in your body, such as your heartbeat. Get plenty of rest and sleep. Keep a regular bed time. Do not use drugs, such as cocaine or ecstasy. Do not use marijuana. Do not smoke or use any products that contain nicotine or tobacco. If you need help quitting, ask your doctor. General instructions Take over-the-counter and prescription medicines only as told by your doctor. Keep all follow-up visits. You may need more tests if palpitations do not go away or get worse. Contact a doctor if: You keep having fast or uneven heartbeats for a long time. Your symptoms happen more often. Get help right away if: You have chest pain. You feel short of breath. You have a very  bad headache. You feel dizzy. You faint. These symptoms may be an emergency. Get help right away. Call your local emergency services (911 in the [place] Do not wait to see if the symptoms will go away. Do not drive yourself to the hospital. Summary Palpitations are feelings that your heartbeat is uneven or faster than normal. It may feel like your heart is fluttering or skipping a beat. Avoid food and drinks that may cause this condition. These include caffeine, chocolate, and alcohol. Try to lower your stress. Do not smoke or use drugs. Get help right away if you faint, feel dizzy, feel short of breath, have chest pain, or have a very bad headache. This information is not intended to replace advice given to you by your health care provider. Make sure you discuss any questions you have with your health care provider. Document Revised: 09/10/2020 Document Reviewed: 09/10/2020 Elsevier Patient Education  2023 Elsevier Inc.  

## 2022-04-08 NOTE — Assessment & Plan Note (Signed)
Stable.  Diet and nutrition discussed. Continues metformin 500 mg twice a day Lab Results  Component Value Date   HGBA1C 6.5 12/17/2021

## 2022-04-08 NOTE — Assessment & Plan Note (Signed)
Stable.  Continue rosuvastatin 40 mg daily. 

## 2022-04-08 NOTE — Assessment & Plan Note (Signed)
Stable. On Keppra and Tegretol. Symptoms may be related to medication side effects and or underlying seizure disorder condition.  Recommend follow-up with neurology

## 2022-04-08 NOTE — Assessment & Plan Note (Signed)
Continue Lexapro 10 mg daily

## 2022-04-08 NOTE — Assessment & Plan Note (Signed)
Stable.  Negative workup in the past.

## 2022-04-09 LAB — THYROID PANEL WITH TSH
Free Thyroxine Index: 1.8 (ref 1.4–3.8)
T3 Uptake: 31 % (ref 22–35)
T4, Total: 5.8 ug/dL (ref 5.1–11.9)
TSH: 1.74 mIU/L (ref 0.40–4.50)

## 2022-04-11 NOTE — Progress Notes (Unsigned)
Cardiology Office Note:    Date:  04/12/2022   ID:  [name] [name], DOB [birth year], MRN [id number]  PCP:  Georgina Quint, MD  Cardiologist:  Little Ishikawa, MD  Electrophysiologist:  None   Referring MD: Georgina Quint, *   Chief Complaint  Patient presents with   Follow-up   Fatigue    History of Present Illness:    [name] [name] is a 71 y.o. female with a hx of hypertension, hyperlipidemia, OSA, TIA, bacterial meningitis, seizures who presents for follow-up.  She was referred by Dr. Alvy Bimler for evaluation of chest pain and dyspnea on exertion, initially seen on 12/21/2021.  She reports that she has had chest pressure recently.  Describes as pressure in central chest, occurring every day.  Can occur with exertion, such as when walking up stairs, but not always.  Also can occur when she is just sitting in a chair resting or when she is coloring.  Can last for hours.  Most active thing she does is grocery shopping, has not reported chest pain with this.  Does report she gets short of breath.  She denies any lightheadedness, syncope, lower extremity edema.  She reports palpitations, occurring once per month and lasting for few seconds.  She smoked for 40 years, about 0.5 packs/day, quit age 24.  Family history includes father died of MI at 33 and mother died of MI at 26.  Echocardiogram 01/01/2022 showed normal biventricular function, no significant valvular disease.  Coronary CTA on 01/13/2022 showed calcium score 572 (95th percentile), moderate disease in RCA and LCx, no significant CAD by CT FFR.  Since last clinic visit, she reports she has been having palpitations and fatigue.  States that has intermittent palpitations where feels like heart is racing.  Also reports feeling significant fatigue.  She denies any lightheadedness or syncope.  Reports chest pain and dyspnea have improved.   Past Medical History:  Diagnosis Date   Anxiety    Cataract     Chronic insomnia 10/13/2016   Depression    Gait disorder 03/14/2014   HTN (hypertension)    Hx of cardiovascular stress test    Lexiscan Myoview (1/16):  No ischemia, EF 68%, breast atten; Low Risk   Hyperlipemia    Hyperlipidemia    Phreesia 07/18/2020   Hypertension    Phreesia 07/18/2020   Memory difficulty 03/14/2014   Meningitis due to bacteria 01-18-14   OSA (obstructive sleep apnea) 11/30/2016   Seizures (HCC)    Sleep apnea    Phreesia 07/18/2020   TIA (transient ischemic attack)     Past Surgical History:  Procedure Laterality Date   NO PAST SURGERIES      Current Medications: Current Meds  Medication Sig   aspirin 81 MG chewable tablet Chew 81 mg by mouth daily.   carbamazepine (TEGRETOL) 200 MG tablet Take 1.5 tablets twice daily   cholecalciferol (VITAMIN D3) 25 MCG (1000 UT) tablet Take 1,000 Units by mouth daily.   escitalopram (LEXAPRO) 10 MG tablet TAKE 1 TABLET(10 MG) BY MOUTH AT BEDTIME   levETIRAcetam (KEPPRA) 750 MG tablet TAKE 2 TABLETS(1500 MG) BY MOUTH TWICE DAILY   lisinopril-hydrochlorothiazide (ZESTORETIC) 20-25 MG tablet TAKE 1 TABLET BY MOUTH DAILY   metFORMIN (GLUCOPHAGE) 500 MG tablet TAKE 1 TABLET(500 MG) BY MOUTH TWICE DAILY WITH A MEAL   Multiple Vitamins-Minerals (MULTIVITAMIN PO) Take 1 tablet by mouth daily.   nitroGLYCERIN (NITROSTAT) 0.4 MG SL tablet Place 1 tablet (0.4 mg total) under  the tongue every 5 (five) minutes as needed for chest pain.   rosuvastatin (CRESTOR) 40 MG tablet Take 1 tablet (40 mg total) by mouth daily.   [DISCONTINUED] carvedilol (COREG) 3.125 MG tablet TAKE 1 TABLET(3.125 MG) BY MOUTH TWICE DAILY WITH A MEAL     Allergies:   Patient has no known allergies.   Social History   Socioeconomic History   Marital status: Divorced    Spouse name: Not on file   Number of children: 1   Years of education: college   Highest education level: Some college, no degree  Occupational History   Occupation: Retired Radio broadcast assistant: [employer]  Tobacco Use   Smoking status: Former    Packs/day: 0.50    Years: 40.00    Total pack years: 20.00    Types: Cigarettes    Quit date: 07/09/2011    Years since quitting: 10.7   Smokeless tobacco: Never  Vaping Use   Vaping Use: Never used  Substance and Sexual Activity   Alcohol use: Yes    Alcohol/week: 0.0 standard drinks of alcohol    Comment: Rare   Drug use: No   Sexual activity: Never    Birth control/protection: Abstinence  Other Topics Concern   Not on file  Social History Narrative   Marital status: divorced x 35 years; not dating; not interested in 2019.      Children: 1 child/son (44); 2 grandchildren; [place]      Lives:  Alone in [place] in [place].      Employment: retired 2008; Administrator, arts.      Tobacco: quit smoking in 2013.  Smoked x many years       Alcohol: none; holidays only      Exercise: none; quit exercising two years ago in 2015.        Seatbelt: 100%; no texting. Does not drive in [address] due to seizure activity.       ADLs: no driving in 9563[place]756 due to seizure activity; independent with ADLs.  Has cane for long distances      Advanced Directives: none; [name]/sister.  FULL CODE no prolonged measures.         Patient is single lives at home alone, has 1 child   Patient is right handed   Education level is 1 year of college   Caffeine consumption is 2 cups daily   Social Determinants of Health   Financial Resource Strain: Low Risk  (10/12/2021)   Overall Financial Resource Strain (CARDIA)    Difficulty of Paying Living Expenses: Not hard at all  Food Insecurity: No Food Insecurity (10/12/2021)   Hunger Vital Sign    Worried About Running Out of Food in the Last Year: Never true    Ran Out of Food in the Last Year: Never true  Transportation Needs: No Transportation Needs (10/12/2021)   PRAPARE - Administrator, Civil Service (Medical): No    Lack of Transportation (Non-Medical): No   Physical Activity: Insufficiently Active (10/12/2021)   Exercise Vital Sign    Days of Exercise per Week: 2 days    Minutes of Exercise per Session: 20 min  Stress: No Stress Concern Present (10/12/2021)   [employer] of Occupational Health - Occupational Stress Questionnaire    Feeling of Stress : Not at all  Social Connections: Moderately Integrated (10/12/2021)   Social Connection and Isolation Panel [NHANES]    Frequency of Communication with Friends and Family:  Three times a week    Frequency of Social Gatherings with Friends and Family: Three times a week    Attends Religious Services: More than 4 times per year    Active Member of Clubs or Organizations: Yes    Attends Banker Meetings: 1 to 4 times per year    Marital Status: Divorced     Family History: The patient's family history includes Arthritis in her sister; Cerebral aneurysm in her sister; Heart attack (age of onset: 74) in her father; Heart attack (age of onset: 65) in her mother; Hyperlipidemia in her brother, brother, and brother; Hypertension in her brother, brother, brother, mother, and sister; Kidney disease in her sister; Rheum arthritis in her sister; Stroke in her mother.  ROS:   Please see the history of present illness.     All other systems reviewed and are negative.  EKGs/Labs/Other Studies Reviewed:    The following studies were reviewed today:   EKG:   04/12/22: Junctional rhythm heart rate 45, no ST abnormality 12/21/2021: Sinus bradycardia, rate 54, first-degree AV block  Recent Labs: 04/08/2022: ALT 11; BUN 25; Creatinine, Ser 1.00; Hemoglobin 11.0; Platelets 201.0; Potassium 4.4; Sodium 142; TSH 1.74  Recent Lipid Panel    Component Value Date/Time   CHOL 202 (H) 12/17/2021 1603   CHOL 211 (H) 07/16/2020 1058   TRIG 118.0 12/17/2021 1603   HDL 73.60 12/17/2021 1603   HDL 72 07/16/2020 1058   CHOLHDL 3 12/17/2021 1603   VLDL 23.6 12/17/2021 1603   LDLCALC 105 (H)  12/17/2021 1603   LDLCALC 116 (H) 07/16/2020 1058    Physical Exam:    VS:  Ht 5' 5.5" (1.664 m)   Wt 242 lb 6.4 oz (110 kg)   BMI 39.72 kg/m     Wt Readings from Last 3 Encounters:  04/12/22 242 lb 6.4 oz (110 kg)  01/28/22 240 lb 6 oz (109 kg)  12/21/21 241 lb 6.4 oz (109.5 kg)     GEN: Well nourished, well developed in no acute distress HEENT: Normal NECK: No JVD; No carotid bruits LYMPHATICS: No lymphadenopathy CARDIAC: RRR, no murmurs, rubs, gallops RESPIRATORY:  Clear to auscultation without rales, wheezing or rhonchi  ABDOMEN: Soft, non-tender, non-distended MUSCULOSKELETAL:  No edema; No deformity  SKIN: Warm and dry NEUROLOGIC:  Alert and oriented x 3 PSYCHIATRIC:  Normal affect   ASSESSMENT:    1. Bradycardia   2. Palpitations   3. Coronary artery disease involving native coronary artery of native heart without angina pectoris   4. Hyperlipidemia, unspecified hyperlipidemia type   5. Essential hypertension     PLAN:    CAD: reported chest pain with possible angina, as describes central chest pressure that can occur with exertion, though not reliably brought on by exertion and can also occur at rest.  She does have significant CAD risk factors (age, former tobacco use, hypertension, hyperlipidemia, family history).  Echocardiogram 01/01/2022 showed normal biventricular function, no significant valvular disease.  Coronary CTA on 01/13/2022 showed calcium score 572 (95th percentile), moderate disease in RCA and LCx, no significant CAD by CT FFR. -Continue ASA, statin.  Check lipid panel  Bradycardia/palpitations: EKG in clinic today shows junctional rhythm, rate 45.  Rhythm strip shows sinus bradycardia with intermittent junctional rhythm.  Suspect this is contributing to her fatigue.  She denies any lightheadedness or syncope.  TSH normal on 04/08/2022.  Recommend stopping carvedilol.  Will check Zio patch x 7 days.  If symptomatic bradycardia persist with  stopping  carvedilol, will need EP referral for PPM  Hypertension: On Coreg 3.125 mg twice daily and lisinopril-HCTZ 20-25 mg daily.  Stop carvedilol as above.  Asked to check BP daily for next 2 weeks and call with results, may need additional antihypertensive with stopping carvedilol  Hyperlipidemia: On rosuvastatin 20 mg daily.  LDL 105 on 12/17/21.  Rosuvastatin increased to 40 mg daily.  Check lipid panel  T2DM: A1c 6.5% on 12/17/21.  On metformin  OSA: on CPAP, reports compliance  RTC in 6 weeks   Medication Adjustments/Labs and Tests Ordered: Current medicines are reviewed at length with the patient today.  Concerns regarding medicines are outlined above.  Orders Placed This Encounter  Procedures   Lipid panel   LONG TERM MONITOR (3-14 DAYS)   EKG 12-Lead   No orders of the defined types were placed in this encounter.   Patient Instructions  Medication Instructions:  STOP carvedilol (Coreg)  Please check your blood pressure at home daily, write it down.  Call the office or send message via Mychart with the readings in 2 weeks for Dr. Bjorn Pippin to review.   *If you need a refill on your cardiac medications before your next appointment, please call your pharmacy*   Lab Work: Lipid today  If you have labs (blood work) drawn today and your tests are completely normal, you will receive your results only by: MyChart Message (if you have MyChart) OR A paper copy in the mail If you have any lab test that is abnormal or we need to change your treatment, we will call you to review the results.   Testing/Procedures: Christena Deem- Long Term Monitor Instructions   Your physician has requested you wear a ZIO patch monitor for _7_ days.  This is a single patch monitor.   [employer] supplies one patch monitor per enrollment. Additional stickers are not available. Please do not apply patch if you will be having a Nuclear Stress Test, Echocardiogram, Cardiac CT, MRI, or Chest Xray during the period you  would be wearing the monitor. The patch cannot be worn during these tests. You cannot remove and re-apply the ZIO XT patch monitor.  Your ZIO patch monitor will be sent Fed Ex from [employer] directly to your home address. It may take 3-5 days to receive your monitor after you have been enrolled.  Once you have received your monitor, please review the enclosed instructions. Your monitor has already been registered assigning a specific monitor serial # to you.  Billing and Patient Assistance Program Information   We have supplied [employer] with any of your insurance information on file for billing purposes. [employer] offers a sliding scale Patient Assistance Program for patients that do not have insurance, or whose insurance does not completely cover the cost of the ZIO monitor.   You must apply for the Patient Assistance Program to qualify for this discounted rate.     To apply, please call [employer] at [phone], select option 4, then select option 2, and ask to apply for Patient Assistance Program.  [name] will ask your household income, and how many people are in your household.  They will quote your out-of-pocket cost based on that information.  [employer] will also be able to set up a 33-month, interest-free payment plan if needed.  Applying the monitor   Shave hair from upper left chest.  Hold abrader disc by orange tab. Rub abrader in 40 strokes over the upper left chest as indicated in your monitor instructions.  Clean area with 4 enclosed alcohol pads. Let dry.  Apply patch as indicated in monitor instructions. Patch will be placed under collarbone on left side of chest with arrow pointing upward.  Rub patch adhesive wings for 2 minutes. Remove white label marked "1". Remove the white label marked "2". Rub patch adhesive wings for 2 additional minutes.  While looking in a mirror, press and release button in center of patch. A small green light will flash 3-4 times. This will be your  only indicator that the monitor has been turned on. ?  Do not shower for the first 24 hours. You may shower after the first 24 hours.  Press the button if you feel a symptom. You will hear a small click. Record Date, Time and Symptom in the Patient Logbook.  When you are ready to remove the patch, follow instructions on the last 2 pages of the Patient Logbook. Stick patch monitor onto the last page of Patient Logbook.  Place Patient Logbook in the blue and white box.  Use locking tab on box and tape box closed securely.  The blue and white box has prepaid postage on it. Please place it in the mailbox as soon as possible. Your physician should have your test results approximately 7 days after the monitor has been mailed back to [facility].  Call [facility] Customer Care at [phone] if you have questions regarding your ZIO XT patch monitor. Call them immediately if you see an orange light blinking on your monitor.  If your monitor falls off in less than 4 days, contact our Monitor department at [phone]. ?If your monitor becomes loose or falls off after 4 days call [employer] at [phone] for suggestions on securing your monitor.?  Follow-Up: At [facility], you and your health needs are our priority.  As part of our continuing mission to provide you with exceptional heart care, we have created designated Provider Care Teams.  These Care Teams include your primary Cardiologist (physician) and Advanced Practice Providers (APPs -  Physician Assistants and Nurse Practitioners) who all work together to provide you with the care you need, when you need it.  We recommend signing up for the patient portal called "MyChart".  Sign up information is provided on this After Visit Summary.  MyChart is used to connect with patients for Virtual Visits (Telemedicine).  Patients are able to view lab/test results, encounter notes, upcoming appointments, etc.  Non-urgent messages can be sent to  your provider as well.   To learn more about what you can do with MyChart, go to [email or web address].    Your next appointment:   6 week(s)  The format for your next appointment:   In Person  Provider:   Little Ishikawa, MD             Signed, Little Ishikawa, MD  04/12/2022 10:31 AM    [facility]

## 2022-04-12 ENCOUNTER — Ambulatory Visit (INDEPENDENT_AMBULATORY_CARE_PROVIDER_SITE_OTHER): Payer: Medicare Other

## 2022-04-12 ENCOUNTER — Ambulatory Visit: Payer: Medicare Other | Attending: Cardiology | Admitting: Cardiology

## 2022-04-12 VITALS — Ht 65.5 in | Wt 242.4 lb

## 2022-04-12 DIAGNOSIS — R001 Bradycardia, unspecified: Secondary | ICD-10-CM

## 2022-04-12 DIAGNOSIS — I2510 Atherosclerotic heart disease of native coronary artery without angina pectoris: Secondary | ICD-10-CM | POA: Diagnosis not present

## 2022-04-12 DIAGNOSIS — I10 Essential (primary) hypertension: Secondary | ICD-10-CM

## 2022-04-12 DIAGNOSIS — E785 Hyperlipidemia, unspecified: Secondary | ICD-10-CM

## 2022-04-12 DIAGNOSIS — R002 Palpitations: Secondary | ICD-10-CM

## 2022-04-12 NOTE — Progress Notes (Unsigned)
Enrolled for [employer] to mail a ZIO XT long term holter monitor to the patients address on file.  

## 2022-04-12 NOTE — Patient Instructions (Addendum)
Medication Instructions:  STOP carvedilol (Coreg)  Please check your blood pressure at home daily, write it down.  Call the office or send message via Mychart with the readings in 2 weeks for Dr. Bjorn Pippin to review.   *If you need a refill on your cardiac medications before your next appointment, please call your pharmacy*   Lab Work: Lipid today  If you have labs (blood work) drawn today and your tests are completely normal, you will receive your results only by: MyChart Message (if you have MyChart) OR A paper copy in the mail If you have any lab test that is abnormal or we need to change your treatment, we will call you to review the results.   Testing/Procedures: [name] [name]- Long Term Monitor Instructions   Your physician has requested you wear a ZIO patch monitor for _7_ days.  This is a single patch monitor.   [employer] supplies one patch monitor per enrollment. Additional stickers are not available. Please do not apply patch if you will be having a Nuclear Stress Test, Echocardiogram, Cardiac CT, MRI, or Chest Xray during the period you would be wearing the monitor. The patch cannot be worn during these tests. You cannot remove and re-apply the ZIO XT patch monitor.  Your ZIO patch monitor will be sent Fed Ex from [employer] directly to your home address. It may take 3-5 days to receive your monitor after you have been enrolled.  Once you have received your monitor, please review the enclosed instructions. Your monitor has already been registered assigning a specific monitor serial # to you.  Billing and Patient Assistance Program Information   We have supplied [employer] with any of your insurance information on file for billing purposes. [employer] offers a sliding scale Patient Assistance Program for patients that do not have insurance, or whose insurance does not completely cover the cost of the ZIO monitor.   You must apply for the Patient Assistance Program to qualify for this  discounted rate.     To apply, please call [employer] at [phone], select option 4, then select option 2, and ask to apply for Patient Assistance Program.  [name] will ask your household income, and how many people are in your household.  They will quote your out-of-pocket cost based on that information.  [employer] will also be able to set up a 69-month, interest-free payment plan if needed.  Applying the monitor   Shave hair from upper left chest.  Hold abrader disc by orange tab. Rub abrader in 40 strokes over the upper left chest as indicated in your monitor instructions.  Clean area with 4 enclosed alcohol pads. Let dry.  Apply patch as indicated in monitor instructions. Patch will be placed under collarbone on left side of chest with arrow pointing upward.  Rub patch adhesive wings for 2 minutes. Remove white label marked "1". Remove the white label marked "2". Rub patch adhesive wings for 2 additional minutes.  While looking in a mirror, press and release button in center of patch. A small green light will flash 3-4 times. This will be your only indicator that the monitor has been turned on. ?  Do not shower for the first 24 hours. You may shower after the first 24 hours.  Press the button if you feel a symptom. You will hear a small click. Record Date, Time and Symptom in the Patient Logbook.  When you are ready to remove the patch, follow instructions on the last 2 pages of the  Patient Logbook. Stick patch monitor onto the last page of Patient Logbook.  Place Patient Logbook in the blue and white box.  Use locking tab on box and tape box closed securely.  The blue and white box has prepaid postage on it. Please place it in the mailbox as soon as possible. Your physician should have your test results approximately 7 days after the monitor has been mailed back to [facility].  Call [facility] Customer Care at [phone] if you have questions regarding your ZIO XT patch monitor. Call  them immediately if you see an orange light blinking on your monitor.  If your monitor falls off in less than 4 days, contact our Monitor department at [phone]. ?If your monitor becomes loose or falls off after 4 days call [employer] at [phone] for suggestions on securing your monitor.?  Follow-Up: At [facility], you and your health needs are our priority.  As part of our continuing mission to provide you with exceptional heart care, we have created designated Provider Care Teams.  These Care Teams include your primary Cardiologist (physician) and Advanced Practice Providers (APPs -  Physician Assistants and Nurse Practitioners) who all work together to provide you with the care you need, when you need it.  We recommend signing up for the patient portal called "MyChart".  Sign up information is provided on this After Visit Summary.  MyChart is used to connect with patients for Virtual Visits (Telemedicine).  Patients are able to view lab/test results, encounter notes, upcoming appointments, etc.  Non-urgent messages can be sent to your provider as well.   To learn more about what you can do with MyChart, go to [email or web address].    Your next appointment:   6 week(s)  The format for your next appointment:   In Person  Provider:   Little Ishikawa, MD

## 2022-04-13 LAB — LIPID PANEL
Chol/HDL Ratio: 2.3 ratio (ref 0.0–4.4)
Cholesterol, Total: 195 mg/dL (ref 100–199)
HDL: 86 mg/dL (ref >39)
LDL Chol Calc (NIH): 95 mg/dL (ref 0–99)
Triglycerides: 78 mg/dL (ref 0–149)
VLDL Cholesterol Cal: 14 mg/dL (ref 5–40)

## 2022-04-14 ENCOUNTER — Other Ambulatory Visit: Payer: Self-pay | Admitting: *Deleted

## 2022-04-14 DIAGNOSIS — R001 Bradycardia, unspecified: Secondary | ICD-10-CM | POA: Diagnosis not present

## 2022-04-14 DIAGNOSIS — R002 Palpitations: Secondary | ICD-10-CM | POA: Diagnosis not present

## 2022-04-14 MED ORDER — EZETIMIBE 10 MG PO TABS: 10.0000 mg | ORAL_TABLET | Freq: Every day | ORAL | 3 refills | Status: DC

## 2022-04-19 ENCOUNTER — Ambulatory Visit: Payer: Medicare Other | Admitting: Cardiology

## 2022-04-20 ENCOUNTER — Telehealth: Payer: Self-pay | Admitting: Cardiology

## 2022-04-20 NOTE — Telephone Encounter (Signed)
Patient c/o Palpitations:  High priority if patient c/o lightheadedness, shortness of breath, or chest pain  How long have you had palpitations/irregular HR/ Afib? Are you having the symptoms now?  Palpitations for the past 2 days Still currently having symptoms--assumes Zetia is causing these symptoms   Are you currently experiencing lightheadedness, SOB or CP?  No   Do you have a history of afib (atrial fibrillation) or irregular heart rhythm?  Not that patient is aware of  Have you checked your BP or HR? (document readings if available):  BP machine not working for the past 2 days--she assumes it needs new batteries, plans to change them out  Are you experiencing any other symptoms?  Hoarseness when talking

## 2022-04-20 NOTE — Telephone Encounter (Signed)
Patient stated she has has palpitations since 12/11. She has not taken coreg as ordered and felt good for about a week. She started Zetia a week later and began to have palpitations. She wants not to take Zetia tonight or tomorrow night to see if palpitations stop. She is not staying hydrated. Education given on hydration. She will be out of town for a funeral for a few days. She can be reached by cell. Leave a message or use MyChart. Patient's concerns are Zetia and palpitations.

## 2022-04-21 NOTE — Telephone Encounter (Signed)
Left message to call back  

## 2022-04-21 NOTE — Telephone Encounter (Signed)
That is okay to trial stopping Zetia and see if palpitations improve.  Is she wearing her ZIO monitor?

## 2022-04-29 ENCOUNTER — Telehealth: Payer: Self-pay | Admitting: Cardiology

## 2022-04-29 DIAGNOSIS — R001 Bradycardia, unspecified: Secondary | ICD-10-CM | POA: Diagnosis not present

## 2022-04-29 DIAGNOSIS — R002 Palpitations: Secondary | ICD-10-CM | POA: Diagnosis not present

## 2022-04-29 NOTE — Telephone Encounter (Signed)
Patient called to report her BP readings as requested.  Patient stated she was unable to take her BP at the same time every day as she was travelling two days and her memory was not very good.  12/13   BP 121/62  5:00 pm 12/14   BP 126/58  1:20 pm 12/15   BP 112/57   1:45 pm 12/16   BP 111/61   12:10 pm 12/17   BP not working 12/18   BP 124/64   12:55 pm 12/19   BP 119/59   12:20 pm 12/20   BP 135/69   6:03 pm 12/21   BP 125/66   7:16 pm 12/22   BP not working 12/23   BP 100/51  12:40 pm 12/24   BP 127/60   9:55 am 12/25   BP  123/60  12:03 pm 12/26   BP  121/65   5:00 pm

## 2022-04-29 NOTE — Telephone Encounter (Signed)
Spoke to patient-reports she went out of town and did not take Zetia and palpations resolved.  Restarted and palpitations began again.    Advised to stop and would make MD aware.  Monitor completed.

## 2022-04-30 NOTE — Telephone Encounter (Signed)
Blood pressure looks good, would not recommend any changes

## 2022-05-11 NOTE — Telephone Encounter (Signed)
Patient aware and verbalized understanding.   Also followed up on monitor results:   Donato Heinz, MD 05/02/2022  8:20 PM EST     Continues to have intermittent junctional rhythm but rates appear improved.  Has her fatigue improved since stopping carvedilol?    Patient reports fatigue has significantly improved since stopping coreg.  Also palpitations improved since stopping zetia.   Advised to call back with any questions or concerns.

## 2022-05-24 ENCOUNTER — Encounter: Payer: Self-pay | Admitting: Cardiology

## 2022-05-24 ENCOUNTER — Ambulatory Visit: Payer: Medicare Other | Attending: Cardiology | Admitting: Cardiology

## 2022-05-24 VITALS — BP 116/52 | HR 42 | Ht 65.5 in | Wt 242.4 lb

## 2022-05-24 DIAGNOSIS — R001 Bradycardia, unspecified: Secondary | ICD-10-CM | POA: Diagnosis not present

## 2022-05-24 DIAGNOSIS — R002 Palpitations: Secondary | ICD-10-CM

## 2022-05-24 DIAGNOSIS — E785 Hyperlipidemia, unspecified: Secondary | ICD-10-CM | POA: Diagnosis not present

## 2022-05-24 DIAGNOSIS — I2510 Atherosclerotic heart disease of native coronary artery without angina pectoris: Secondary | ICD-10-CM | POA: Diagnosis not present

## 2022-05-24 DIAGNOSIS — I10 Essential (primary) hypertension: Secondary | ICD-10-CM

## 2022-05-24 NOTE — Progress Notes (Signed)
Cardiology Office Note:    Date:  05/24/2022   ID:  [name] [name], DOB 1950-10-07, MRN [id number]  PCP:  Horald Pollen, MD  Cardiologist:  Donato Heinz, MD  Electrophysiologist:  None   Referring MD: Horald Pollen, *   No chief complaint on file.   History of Present Illness:    [name] [name] is a 72 y.o. female with a hx of hypertension, hyperlipidemia, OSA, TIA, bacterial meningitis, seizures who presents for follow-up.  She was referred by Dr. Mitchel Honour for evaluation of chest pain and dyspnea on exertion, initially seen on 12/21/2021.  She reports that she has had chest pressure recently.  Describes as pressure in central chest, occurring every day.  Can occur with exertion, such as when walking up stairs, but not always.  Also can occur when she is just sitting in a chair resting or when she is coloring.  Can last for hours.  Most active thing she does is grocery shopping, has not reported chest pain with this.  Does report she gets short of breath.  She denies any lightheadedness, syncope, lower extremity edema.  She reports palpitations, occurring once per month and lasting for few seconds.  She smoked for 40 years, about 0.5 packs/day, quit age 37.  Family history includes father died of MI at 37 and mother died of MI at 47.  Echocardiogram 01/01/2022 showed normal biventricular function, no significant valvular disease.  Coronary CTA on 01/13/2022 showed calcium score 572 (95th percentile), moderate disease in RCA and LCx, no significant CAD by CT FFR.  She was noted at clinic visit 04/2022 to be in junctional rhythm with rate in 40s.  She was on carvedilol which was discontinued.  Zio patch x 8 days on 04/29/2022 showed episodes of junctional rhythm, but normal rate.  Since last clinic visit, she reports she has been doing okay.  Continues to have intermittent palpitations a few times per week.  Lasts for 5 minutes or so.  Reports her fatigue is  significantly improved since stopping carvedilol.  Reports some lightheadedness but denies any syncope.  Denies any chest pain or dyspnea.  She felt like Zetia was making her palpitations worse so she stopped taking.   Past Medical History:  Diagnosis Date   Anxiety    Cataract    Chronic insomnia 10/13/2016   Depression    Gait disorder 03/14/2014   HTN (hypertension)    Hx of cardiovascular stress test    Lexiscan Myoview (1/16):  No ischemia, EF 68%, breast atten; Low Risk   Hyperlipemia    Hyperlipidemia    Phreesia 07/18/2020   Hypertension    Phreesia 07/18/2020   Memory difficulty 03/14/2014   Meningitis due to bacteria 01-18-14   OSA (obstructive sleep apnea) 11/30/2016   Seizures ([place])    Sleep apnea    Phreesia 07/18/2020   TIA (transient ischemic attack)     Past Surgical History:  Procedure Laterality Date   NO PAST SURGERIES      Current Medications: Current Meds  Medication Sig   aspirin 81 MG chewable tablet Chew 81 mg by mouth daily.   carbamazepine (TEGRETOL) 200 MG tablet Take 1.5 tablets twice daily   cholecalciferol (VITAMIN D3) 25 MCG (1000 UT) tablet Take 1,000 Units by mouth daily.   escitalopram (LEXAPRO) 10 MG tablet TAKE 1 TABLET(10 MG) BY MOUTH AT BEDTIME   ezetimibe (ZETIA) 10 MG tablet Take 10 mg by mouth daily.   levETIRAcetam (KEPPRA) 750  MG tablet TAKE 2 TABLETS(1500 MG) BY MOUTH TWICE DAILY   lisinopril-hydrochlorothiazide (ZESTORETIC) 20-25 MG tablet TAKE 1 TABLET BY MOUTH DAILY   metFORMIN (GLUCOPHAGE) 500 MG tablet TAKE 1 TABLET(500 MG) BY MOUTH TWICE DAILY WITH A MEAL   Multiple Vitamins-Minerals (MULTIVITAMIN PO) Take 1 tablet by mouth daily.   nitroGLYCERIN (NITROSTAT) 0.4 MG SL tablet Place 1 tablet (0.4 mg total) under the tongue every 5 (five) minutes as needed for chest pain.   rosuvastatin (CRESTOR) 40 MG tablet Take 1 tablet (40 mg total) by mouth daily.     Allergies:   Zetia [ezetimibe]   Social History   Socioeconomic  History   Marital status: Divorced    Spouse name: Not on file   Number of children: 1   Years of education: college   Highest education level: Some college, no degree  Occupational History   Occupation: Retired Financial planner: [employer]  Tobacco Use   Smoking status: Former    Packs/day: 0.50    Years: 40.00    Total pack years: 20.00    Types: Cigarettes    Quit date: 07/09/2011    Years since quitting: 10.8   Smokeless tobacco: Never  Vaping Use   Vaping Use: Never used  Substance and Sexual Activity   Alcohol use: Yes    Alcohol/week: 0.0 standard drinks of alcohol    Comment: Rare   Drug use: No   Sexual activity: Never    Birth control/protection: Abstinence  Other Topics Concern   Not on file  Social History Narrative   Marital status: divorced x 35 years; not dating; not interested in 2019.      Children: 1 child/son (44); 2 grandchildren; [place]      Lives:  Alone in [place] in [place].      Employment: retired 2008; Administrator, arts.      Tobacco: quit smoking in 2013.  Smoked x many years       Alcohol: none; holidays only      Exercise: none; quit exercising two years ago in 2015.        Seatbelt: 100%; no texting. Does not drive in [address]209 due to seizure activity.       ADLs: no driving in 4709[place]6283 due to seizure activity; independent with ADLs.  Has cane for long distances      Advanced Directives: none; [name]/sister.  FULL CODE no prolonged measures.         Patient is single lives at home alone, has 1 child   Patient is right handed   Education level is 1 year of college   Caffeine consumption is 2 cups daily   Social Determinants of Health   Financial Resource Strain: Low Risk  (10/12/2021)   Overall Financial Resource Strain (CARDIA)    Difficulty of Paying Living Expenses: Not hard at all  Food Insecurity: No Food Insecurity (10/12/2021)   Hunger Vital Sign    Worried About Running Out of Food in the Last Year: Never true     Ran Out of Food in the Last Year: Never true  Transportation Needs: No Transportation Needs (10/12/2021)   PRAPARE - Administrator, Civil Service (Medical): No    Lack of Transportation (Non-Medical): No  Physical Activity: Insufficiently Active (10/12/2021)   Exercise Vital Sign    Days of Exercise per Week: 2 days    Minutes of Exercise per Session: 20 min  Stress: No Stress Concern Present (  10/12/2021)   [employer]    Feeling of Stress : Not at all  Social Connections: Moderately Integrated (10/12/2021)   Social Connection and Isolation Panel [NHANES]    Frequency of Communication with Friends and Family: Three times a week    Frequency of Social Gatherings with Friends and Family: Three times a week    Attends Religious Services: More than 4 times per year    Active Member of Clubs or Organizations: Yes    Attends Archivist Meetings: 1 to 4 times per year    Marital Status: Divorced     Family History: The patient's family history includes Arthritis in her sister; Cerebral aneurysm in her sister; Heart attack (age of onset: 73) in her father; Heart attack (age of onset: 45) in her mother; Hyperlipidemia in her brother, brother, and brother; Hypertension in her brother, brother, brother, mother, and sister; Kidney disease in her sister; Rheum arthritis in her sister; Stroke in her mother.  ROS:   Please see the history of present illness.     All other systems reviewed and are negative.  EKGs/Labs/Other Studies Reviewed:    The following studies were reviewed today:   EKG:    05/24/22: sinus bradycardia, rate 54, no ST abnormality 04/12/22: Junctional rhythm heart rate 45, no ST abnormality 12/21/2021: Sinus bradycardia, rate 54, first-degree AV block  Recent Labs: 04/08/2022: ALT 11; BUN 25; Creatinine, Ser 1.00; Hemoglobin 11.0; Platelets 201.0; Potassium 4.4; Sodium 142; TSH 1.74  Recent  Lipid Panel    Component Value Date/Time   CHOL 195 04/12/2022 1025   TRIG 78 04/12/2022 1025   HDL 86 04/12/2022 1025   CHOLHDL 2.3 04/12/2022 1025   CHOLHDL 3 12/17/2021 1603   VLDL 23.6 12/17/2021 1603   LDLCALC 95 04/12/2022 1025    Physical Exam:    VS:  BP (!) 116/52   Pulse (!) 42   Ht 5' 5.5" (1.664 m)   Wt 242 lb 6.4 oz (110 kg)   SpO2 100%   BMI 39.72 kg/m     Wt Readings from Last 3 Encounters:  05/24/22 242 lb 6.4 oz (110 kg)  04/12/22 242 lb 6.4 oz (110 kg)  01/28/22 240 lb 6 oz (109 kg)     GEN: Well nourished, well developed in no acute distress HEENT: Normal NECK: No JVD; No carotid bruits LYMPHATICS: No lymphadenopathy CARDIAC: RRR, no murmurs, rubs, gallops RESPIRATORY:  Clear to auscultation without rales, wheezing or rhonchi  ABDOMEN: Soft, non-tender, non-distended MUSCULOSKELETAL:  No edema; No deformity  SKIN: Warm and dry NEUROLOGIC:  Alert and oriented x 3 PSYCHIATRIC:  Normal affect   ASSESSMENT:    1. Coronary artery disease involving native coronary artery of native heart without angina pectoris   2. Bradycardia   3. Palpitations   4. Essential hypertension   5. Hyperlipidemia, unspecified hyperlipidemia type      PLAN:    CAD: reported chest pain with possible angina, as describes central chest pressure that can occur with exertion, though not reliably brought on by exertion and can also occur at rest.  She does have significant CAD risk factors (age, former tobacco use, hypertension, hyperlipidemia, family history).  Echocardiogram 01/01/2022 showed normal biventricular function, no significant valvular disease.  Coronary CTA on 01/13/2022 showed calcium score 572 (95th percentile), moderate disease in RCA and LCx, no significant CAD by CT FFR. -Continue ASA -On rosuvastatin 40 mg daily, LDL 95 on 04/12/2022.  Zetia 10 mg daily added but she stopped taking due to worsening palpitations.  Unclear if related to Zetia use, recommend  rechallenging with Zetia  Bradycardia/palpitations: She was noted at clinic visit 04/2022 to be in junctional rhythm with rate in 40s.  She was on carvedilol which was discontinued.  Reports her fatigue significantly improved since stopping carvedilol.  Zio patch x 8 days on 04/29/2022 showed episodes of junctional rhythm, but normal rate.  Hypertension: On lisinopril-HCTZ 20-25 mg daily.  Appears controlled.  Hyperlipidemia: On rosuvastatin 40 mg daily, LDL 95 on 04/12/2022.  Zetia 10 mg daily added.  Reported worsening palpitations when she started Zetia so she stopped taking.  Not clear that this was related to Zetia use.  Recommend rechallenging with Zetia.  T2DM: A1c 6.5% on 12/17/21.  On metformin  OSA: on CPAP, reports compliance  RTC in 3 months   Medication Adjustments/Labs and Tests Ordered: Current medicines are reviewed at length with the patient today.  Concerns regarding medicines are outlined above.  Orders Placed This Encounter  Procedures   EKG 12-Lead   No orders of the defined types were placed in this encounter.   Patient Instructions  Medication Instructions:  RESTART Zetia --if palpitations return, stop medication and let us know  *If you need a refill on your cardiac medications before your next appointment, please call your pharmacy*  Follow-Up: At [facility], you and your health needs are our priority.  As part of our continuing mission to provide you with exceptional heart care, we have created designated Provider Care Teams.  These Care Teams include your primary Cardiologist (physician) and Advanced Practice Providers (APPs -  Physician Assistants and Nurse Practitioners) who all work together to provide you with the care you need, when you need it.  We recommend signing up for the patient portal called "MyChart".  Sign up information is provided on this After Visit Summary.  MyChart is used to connect with patients for Virtual Visits  (Telemedicine).  Patients are able to view lab/test results, encounter notes, upcoming appointments, etc.  Non-urgent messages can be sent to your provider as well.   To learn more about what you can do with MyChart, go to [email or web address].    Your next appointment:   3 month(s)  Provider:   Little Ishikawa, MD       Signed, Little Ishikawa, MD  05/24/2022 10:12 AM    [facility]

## 2022-05-24 NOTE — Patient Instructions (Signed)
Medication Instructions:  RESTART Zetia --if palpitations return, stop medication and let us know  *If you need a refill on your cardiac medications before your next appointment, please call your pharmacy*  Follow-Up: At [facility], you and your health needs are our priority.  As part of our continuing mission to provide you with exceptional heart care, we have created designated Provider Care Teams.  These Care Teams include your primary Cardiologist (physician) and Advanced Practice Providers (APPs -  Physician Assistants and Nurse Practitioners) who all work together to provide you with the care you need, when you need it.  We recommend signing up for the patient portal called "MyChart".  Sign up information is provided on this After Visit Summary.  MyChart is used to connect with patients for Virtual Visits (Telemedicine).  Patients are able to view lab/test results, encounter notes, upcoming appointments, etc.  Non-urgent messages can be sent to your provider as well.   To learn more about what you can do with MyChart, go to [email or web address].    Your next appointment:   3 month(s)  Provider:   Donato Heinz, MD

## 2022-06-28 ENCOUNTER — Other Ambulatory Visit: Payer: Self-pay | Admitting: Emergency Medicine

## 2022-06-28 DIAGNOSIS — G4733 Obstructive sleep apnea (adult) (pediatric): Secondary | ICD-10-CM | POA: Diagnosis not present

## 2022-06-28 DIAGNOSIS — E1165 Type 2 diabetes mellitus with hyperglycemia: Secondary | ICD-10-CM

## 2022-08-04 ENCOUNTER — Ambulatory Visit (INDEPENDENT_AMBULATORY_CARE_PROVIDER_SITE_OTHER): Payer: Medicare Other | Admitting: Emergency Medicine

## 2022-08-04 ENCOUNTER — Encounter: Payer: Self-pay | Admitting: Emergency Medicine

## 2022-08-04 VITALS — BP 126/74 | HR 55 | Temp 98.3°F | Ht 65.5 in | Wt 237.1 lb

## 2022-08-04 DIAGNOSIS — R7303 Prediabetes: Secondary | ICD-10-CM | POA: Diagnosis not present

## 2022-08-04 DIAGNOSIS — Z130 Encounter for screening for diseases of the blood and blood-forming organs and certain disorders involving the immune mechanism: Secondary | ICD-10-CM | POA: Diagnosis not present

## 2022-08-04 DIAGNOSIS — E785 Hyperlipidemia, unspecified: Secondary | ICD-10-CM

## 2022-08-04 DIAGNOSIS — Z13220 Encounter for screening for lipoid disorders: Secondary | ICD-10-CM | POA: Diagnosis not present

## 2022-08-04 DIAGNOSIS — I10 Essential (primary) hypertension: Secondary | ICD-10-CM | POA: Diagnosis not present

## 2022-08-04 DIAGNOSIS — Z13228 Encounter for screening for other metabolic disorders: Secondary | ICD-10-CM | POA: Diagnosis not present

## 2022-08-04 DIAGNOSIS — Z1329 Encounter for screening for other suspected endocrine disorder: Secondary | ICD-10-CM

## 2022-08-04 DIAGNOSIS — Z0000 Encounter for general adult medical examination without abnormal findings: Secondary | ICD-10-CM

## 2022-08-04 LAB — CBC WITH DIFFERENTIAL/PLATELET
Basophils Absolute: 0 10*3/uL (ref 0.0–0.1)
Basophils Relative: 0.4 % (ref 0.0–3.0)
Eosinophils Absolute: 0.1 10*3/uL (ref 0.0–0.7)
Eosinophils Relative: 2.3 % (ref 0.0–5.0)
HCT: 33.4 % — ABNORMAL LOW (ref 36.0–46.0)
Hemoglobin: 10.9 g/dL — ABNORMAL LOW (ref 12.0–15.0)
Lymphocytes Relative: 27.3 % (ref 12.0–46.0)
Lymphs Abs: 0.7 10*3/uL (ref 0.7–4.0)
MCHC: 32.5 g/dL (ref 30.0–36.0)
MCV: 80.2 fl (ref 78.0–100.0)
Monocytes Absolute: 0.3 10*3/uL (ref 0.1–1.0)
Monocytes Relative: 10.5 % (ref 3.0–12.0)
Neutro Abs: 1.5 10*3/uL (ref 1.4–7.7)
Neutrophils Relative %: 59.5 % (ref 43.0–77.0)
Platelets: 197 10*3/uL (ref 150.0–400.0)
RBC: 4.16 Mil/uL (ref 3.87–5.11)
RDW: 13.2 % (ref 11.5–15.5)
WBC: 2.6 10*3/uL — ABNORMAL LOW (ref 4.0–10.5)

## 2022-08-04 LAB — COMPREHENSIVE METABOLIC PANEL
ALT: 14 U/L (ref 0–35)
AST: 20 U/L (ref 0–37)
Albumin: 4.3 g/dL (ref 3.5–5.2)
Alkaline Phosphatase: 81 U/L (ref 39–117)
BUN: 24 mg/dL — ABNORMAL HIGH (ref 6–23)
CO2: 32 mEq/L (ref 19–32)
Calcium: 9.4 mg/dL (ref 8.4–10.5)
Chloride: 103 mEq/L (ref 96–112)
Creatinine, Ser: 0.93 mg/dL (ref 0.40–1.20)
GFR: 61.9 mL/min (ref >60.00)
Glucose, Bld: 96 mg/dL (ref 70–99)
Potassium: 4.2 mEq/L (ref 3.5–5.1)
Sodium: 140 mEq/L (ref 135–145)
Total Bilirubin: 0.3 mg/dL (ref 0.2–1.2)
Total Protein: 6.5 g/dL (ref 6.0–8.3)

## 2022-08-04 LAB — HEMOGLOBIN A1C: Hgb A1c MFr Bld: 6.4 % (ref 4.6–6.5)

## 2022-08-04 NOTE — Progress Notes (Signed)
[name] [name] 72 y.o.   Chief Complaint  Patient presents with   Annual Exam    Rash behind ear     HISTORY OF PRESENT ILLNESS: This is a 72 y.o. female here for annual exam Overall doing well.  Stable chronic medical conditions Seen and evaluated recently by cardiologist Taking off carvedilol.  Symptoms of fatigue much improved No longer having palpitations BP Readings from Last 3 Encounters:  08/04/22 126/74  05/24/22 (!) 116/52  04/08/22 110/60   Wt Readings from Last 3 Encounters:  08/04/22 237 lb 2 oz (107.6 kg)  05/24/22 242 lb 6.4 oz (110 kg)  04/12/22 242 lb 6.4 oz (110 kg)   Lab Results  Component Value Date   HGBA1C 6.5 12/17/2021     HPI   Prior to Admission medications   Medication Sig Start Date End Date Taking? Authorizing Provider  aspirin 81 MG chewable tablet Chew 81 mg by mouth daily.   Yes [provider]  carbamazepine (TEGRETOL) 200 MG tablet Take 1.5 tablets twice daily 10/29/21  Yes Suzzanne Cloud, NP  cholecalciferol (VITAMIN D3) 25 MCG (1000 UT) tablet Take 1,000 Units by mouth daily.   Yes [provider]  escitalopram (LEXAPRO) 10 MG tablet TAKE 1 TABLET(10 MG) BY MOUTH AT BEDTIME 03/08/22  Yes Khylah Kendra, Ines Bloomer, MD  ezetimibe (ZETIA) 10 MG tablet Take 10 mg by mouth daily.   Yes [provider]  levETIRAcetam (KEPPRA) 750 MG tablet TAKE 2 TABLETS(1500 MG) BY MOUTH TWICE DAILY 10/29/21  Yes Suzzanne Cloud, NP  lisinopril-hydrochlorothiazide (ZESTORETIC) 20-25 MG tablet TAKE 1 TABLET BY MOUTH DAILY 03/08/22  Yes Amillia Biffle, Ines Bloomer, MD  metFORMIN (GLUCOPHAGE) 500 MG tablet TAKE 1 TABLET(500 MG) BY MOUTH TWICE DAILY WITH A MEAL 06/29/22  Yes Mayco Walrond, Ines Bloomer, MD  Multiple Vitamins-Minerals (MULTIVITAMIN PO) Take 1 tablet by mouth daily.   Yes [provider]  nitroGLYCERIN (NITROSTAT) 0.4 MG SL tablet Place 1 tablet (0.4 mg total) under the tongue every 5 (five) minutes as needed for chest pain.  12/21/21  Yes Donato Heinz, MD  rosuvastatin (CRESTOR) 40 MG tablet Take 1 tablet (40 mg total) by mouth daily. 01/15/22  Yes Donato Heinz, MD    Allergies  Allergen Reactions   Zetia [Ezetimibe]     Palpitations     Patient Active Problem List   Diagnosis Date Noted   Tiredness 04/08/2022   Dyspnea on exertion 12/17/2021   Seizure disorder 05/28/2019   Prediabetes 05/28/2019   Staring episodes 11/24/2017   OSA (obstructive sleep apnea) 11/30/2016   Chronic insomnia 10/13/2016   Transient cerebral ischemia 08/26/2016   Transient memory loss 08/26/2016   Morbid obesity 05/31/2016   Glucose intolerance (impaired glucose tolerance) 05/18/2016   Pronation deformity of ankle, acquired 04/19/2014   Generalized anxiety disorder 03/15/2014   Memory difficulty 03/14/2014   Demand myocardial ischemic related infarction 01/23/2014    Class: Diagnosis of   Depression, reactive 10/12/2012   Chronic neutropenia 04/14/2012   Dyslipidemia 10/20/2011   HTN (hypertension) 10/20/2011    Past Medical History:  Diagnosis Date   Anxiety    Cataract    Chronic insomnia 10/13/2016   Depression    Gait disorder 03/14/2014   HTN (hypertension)    Hx of cardiovascular stress test    Lexiscan Myoview (1/16):  No ischemia, EF 68%, breast atten; Low Risk   Hyperlipemia    Hyperlipidemia    Phreesia 07/18/2020   Hypertension  Phreesia 07/18/2020   Memory difficulty 03/14/2014   Meningitis due to bacteria 01-18-14   OSA (obstructive sleep apnea) 11/30/2016   Seizures    Sleep apnea    Phreesia 07/18/2020   TIA (transient ischemic attack)     Past Surgical History:  Procedure Laterality Date   NO PAST SURGERIES      Social History   Socioeconomic History   Marital status: Divorced    Spouse name: Not on file   Number of children: 1   Years of education: college   Highest education level: Some college, no degree  Occupational History   Occupation: Retired Human resources officer: [employer]  Tobacco Use   Smoking status: Former    Packs/day: 0.50    Years: 40.00    Additional pack years: 0.00    Total pack years: 20.00    Types: Cigarettes    Quit date: 07/09/2011    Years since quitting: 11.0   Smokeless tobacco: Never  Vaping Use   Vaping Use: Never used  Substance and Sexual Activity   Alcohol use: Yes    Alcohol/week: 0.0 standard drinks of alcohol    Comment: Rare   Drug use: No   Sexual activity: Never    Birth control/protection: Abstinence  Other Topics Concern   Not on file  Social History Narrative   Marital status: divorced x 35 years; not dating; not interested in 2019.      Children: 1 child/son (44); 2 grandchildren; [place]      Lives:  Alone in [place] in [place].      Employment: retired 2008; Engineer, technical sales.      Tobacco: quit smoking in 2013.  Smoked x many years       Alcohol: none; holidays only      Exercise: none; quit exercising two years ago in 2015.        Seatbelt: 100%; no texting. Does not drive in [id number] due to seizure activity.       ADLs: no driving in [id number] due to seizure activity; independent with ADLs.  Has cane for long distances      Advanced Directives: none; [name]/sister.  FULL CODE no prolonged measures.         Patient is single lives at home alone, has 1 child   Patient is right handed   Education level is 1 year of college   Caffeine consumption is 2 cups daily   Social Determinants of Health   Financial Resource Strain: Low Risk  (10/12/2021)   Overall Financial Resource Strain (CARDIA)    Difficulty of Paying Living Expenses: Not hard at all  Food Insecurity: No Food Insecurity (10/12/2021)   Hunger Vital Sign    Worried About Running Out of Food in the Last Year: Never true    Ran Out of Food in the Last Year: Never true  Transportation Needs: No Transportation Needs (10/12/2021)   PRAPARE - Hydrologist (Medical): No    Lack of  Transportation (Non-Medical): No  Physical Activity: Insufficiently Active (10/12/2021)   Exercise Vital Sign    Days of Exercise per Week: 2 days    Minutes of Exercise per Session: 20 min  Stress: No Stress Concern Present (10/12/2021)   [place]    Feeling of Stress : Not at all  Social Connections: Moderately Integrated (10/12/2021)   Social Connection and Isolation Panel [NHANES]  Frequency of Communication with Friends and Family: Three times a week    Frequency of Social Gatherings with Friends and Family: Three times a week    Attends Religious Services: More than 4 times per year    Active Member of Clubs or Organizations: Yes    Attends Archivist Meetings: 1 to 4 times per year    Marital Status: Divorced  Intimate Partner Violence: Not At Risk (10/12/2021)   Humiliation, Afraid, Rape, and Kick questionnaire    Fear of Current or Ex-Partner: No    Emotionally Abused: No    Physically Abused: No    Sexually Abused: No    Family History  Problem Relation Age of Onset   Heart attack Mother 71   Stroke Mother    Hypertension Mother    Heart attack Father 77   Cerebral aneurysm Sister    Hypertension Sister    Kidney disease Sister        ESRD; HD in last year   Hypertension Brother    Hyperlipidemia Brother    Rheum arthritis Sister    Arthritis Sister    Hypertension Brother    Hyperlipidemia Brother    Hyperlipidemia Brother    Hypertension Brother      Review of Systems  Constitutional: Negative.  Negative for chills and fever.  HENT: Negative.  Negative for congestion and sore throat.   Respiratory: Negative.  Negative for cough and shortness of breath.   Cardiovascular: Negative.  Negative for chest pain and palpitations.  Gastrointestinal:  Negative for abdominal pain, diarrhea, nausea and vomiting.  Genitourinary: Negative.  Negative for dysuria and hematuria.  Skin:  Positive  for rash (Behind left ear).  Neurological: Negative.  Negative for dizziness and headaches.  All other systems reviewed and are negative.   Vitals:   08/04/22 1128  BP: 126/74  Pulse: (!) 55  Temp: 98.3 F (36.8 C)  SpO2: 96%    Physical Exam Vitals reviewed.  Constitutional:      Appearance: Normal appearance.  HENT:     Head: Normocephalic.     Right Ear: Tympanic membrane, ear canal and external ear normal.     Left Ear: Tympanic membrane, ear canal and external ear normal.     Mouth/Throat:     Mouth: Mucous membranes are moist.     Pharynx: Oropharynx is clear.  Eyes:     Extraocular Movements: Extraocular movements intact.     Conjunctiva/sclera: Conjunctivae normal.     Pupils: Pupils are equal, round, and reactive to light.  Cardiovascular:     Rate and Rhythm: Normal rate and regular rhythm.     Pulses: Normal pulses.     Heart sounds: Normal heart sounds.  Pulmonary:     Effort: Pulmonary effort is normal.     Breath sounds: Normal breath sounds.  Abdominal:     Palpations: Abdomen is soft.     Tenderness: There is no abdominal tenderness.  Musculoskeletal:     Cervical back: No tenderness.  Lymphadenopathy:     Cervical: No cervical adenopathy.  Skin:    General: Skin is warm and dry.     Findings: Rash (Flat hyperpigmented rash behind left ear) present.  Neurological:     General: No focal deficit present.     Mental Status: She is alert and oriented to person, place, and time.  Psychiatric:        Mood and Affect: Mood normal.  Behavior: Behavior normal.      ASSESSMENT & PLAN: Problem List Items Addressed This Visit       Cardiovascular and Mediastinum   HTN (hypertension) (Chronic)   Relevant Orders   CBC with Differential   Comprehensive metabolic panel     Other   Dyslipidemia   Prediabetes   Relevant Orders   CBC with Differential   Hemoglobin A1c   Other Visit Diagnoses     Routine general medical examination at a  health care facility    -  Primary   Relevant Orders   CBC with Differential   Comprehensive metabolic panel   Hemoglobin A1c   Screening for deficiency anemia       Relevant Orders   CBC with Differential   Screening for lipoid disorders       Screening for endocrine, metabolic and immunity disorder       Relevant Orders   Comprehensive metabolic panel   Hemoglobin A1c      Modifiable risk factors discussed with patient. Anticipatory guidance according to age provided. The following topics were also discussed: Social Determinants of Health Smoking.  Non-smoker Diet and nutrition.  Eating better Benefits of exercise Cancer screening and review of most recent colonoscopy and mammogram reports Vaccinations reviewed and recommendations Cardiovascular risk assessment Review of multiple chronic medical conditions under management Review of all medications Mental health including depression and anxiety Fall and accident prevention  Patient Instructions  Health Maintenance, Female Adopting a healthy lifestyle and getting preventive care are important in promoting health and wellness. Ask your health care provider about: The right schedule for you to have regular tests and exams. Things you can do on your own to prevent diseases and keep yourself healthy. What should I know about diet, weight, and exercise? Eat a healthy diet  Eat a diet that includes plenty of vegetables, fruits, low-fat dairy products, and lean protein. Do not eat a lot of foods that are high in solid fats, added sugars, or sodium. Maintain a healthy weight Body mass index (BMI) is used to identify weight problems. It estimates body fat based on height and weight. Your health care provider can help determine your BMI and help you achieve or maintain a healthy weight. Get regular exercise Get regular exercise. This is one of the most important things you can do for your health. Most adults should: Exercise for  at least 150 minutes each week. The exercise should increase your heart rate and make you sweat (moderate-intensity exercise). Do strengthening exercises at least twice a week. This is in addition to the moderate-intensity exercise. Spend less time sitting. Even light physical activity can be beneficial. Watch cholesterol and blood lipids Have your blood tested for lipids and cholesterol at 72 years of age, then have this test every 5 years. Have your cholesterol levels checked more often if: Your lipid or cholesterol levels are high. You are older than 72 years of age. You are at high risk for heart disease. What should I know about cancer screening? Depending on your health history and family history, you may need to have cancer screening at various ages. This may include screening for: Breast cancer. Cervical cancer. Colorectal cancer. Skin cancer. Lung cancer. What should I know about heart disease, diabetes, and high blood pressure? Blood pressure and heart disease High blood pressure causes heart disease and increases the risk of stroke. This is more likely to develop in people who have high blood pressure readings or are overweight.  Have your blood pressure checked: Every 3-5 years if you are 77-43 years of age. Every year if you are 70 years old or older. Diabetes Have regular diabetes screenings. This checks your fasting blood sugar level. Have the screening done: Once every three years after age 70 if you are at a normal weight and have a low risk for diabetes. More often and at a younger age if you are overweight or have a high risk for diabetes. What should I know about preventing infection? Hepatitis B If you have a higher risk for hepatitis B, you should be screened for this virus. Talk with your health care provider to find out if you are at risk for hepatitis B infection. Hepatitis C Testing is recommended for: Everyone born from [birth year] through 1965. Anyone with known  risk factors for hepatitis C. Sexually transmitted infections (STIs) Get screened for STIs, including gonorrhea and chlamydia, if: You are sexually active and are younger than 72 years of age. You are older than 72 years of age and your health care provider tells you that you are at risk for this type of infection. Your sexual activity has changed since you were last screened, and you are at increased risk for chlamydia or gonorrhea. Ask your health care provider if you are at risk. Ask your health care provider about whether you are at high risk for HIV. Your health care provider may recommend a prescription medicine to help prevent HIV infection. If you choose to take medicine to prevent HIV, you should first get tested for HIV. You should then be tested every 3 months for as long as you are taking the medicine. Pregnancy If you are about to stop having your period (premenopausal) and you may become pregnant, seek counseling before you get pregnant. Take 400 to 800 micrograms (mcg) of folic acid every day if you become pregnant. Ask for birth control (contraception) if you want to prevent pregnancy. Osteoporosis and menopause Osteoporosis is a disease in which the bones lose minerals and strength with aging. This can result in bone fractures. If you are 71 years old or older, or if you are at risk for osteoporosis and fractures, ask your health care provider if you should: Be screened for bone loss. Take a calcium or vitamin D supplement to lower your risk of fractures. Be given hormone replacement therapy (HRT) to treat symptoms of menopause. Follow these instructions at home: Alcohol use Do not drink alcohol if: Your health care provider tells you not to drink. You are pregnant, may be pregnant, or are planning to become pregnant. If you drink alcohol: Limit how much you have to: 0-1 drink a day. Know how much alcohol is in your drink. In the [place] one drink equals one 12 oz bottle of  beer (355 mL), one 5 oz glass of wine (148 mL), or one 1 oz glass of hard liquor (44 mL). Lifestyle Do not use any products that contain nicotine or tobacco. These products include cigarettes, chewing tobacco, and vaping devices, such as e-cigarettes. If you need help quitting, ask your health care provider. Do not use street drugs. Do not share needles. Ask your health care provider for help if you need support or information about quitting drugs. General instructions Schedule regular health, dental, and eye exams. Stay current with your vaccines. Tell your health care provider if: You often feel depressed. You have ever been abused or do not feel safe at home. Summary Adopting a healthy lifestyle and getting  preventive care are important in promoting health and wellness. Follow your health care provider's instructions about healthy diet, exercising, and getting tested or screened for diseases. Follow your health care provider's instructions on monitoring your cholesterol and blood pressure. This information is not intended to replace advice given to you by your health care provider. Make sure you discuss any questions you have with your health care provider. Document Revised: 09/08/2020 Document Reviewed: 09/08/2020 Elsevier Patient Education  [place], MD [facility]

## 2022-08-04 NOTE — Patient Instructions (Signed)

## 2022-08-15 NOTE — Progress Notes (Unsigned)
Cardiology Office Note:    Date:  08/17/2022   ID:  [name] [name], DOB [birth year], MRN [id number]  PCP:  Georgina Quint, MD  Cardiologist:  Little Ishikawa, MD  Electrophysiologist:  None   Referring MD: Georgina Quint, *   Chief Complaint  Patient presents with   Coronary Artery Disease    History of Present Illness:    [name] [name] is a 72 y.o. female with a hx of hypertension, hyperlipidemia, OSA, TIA, bacterial meningitis, seizures who presents for follow-up.  She was referred by Dr. Alvy Bimler for evaluation of chest pain and dyspnea on exertion, initially seen on 12/21/2021.  She reports that she has had chest pressure recently.  Describes as pressure in central chest, occurring every day.  Can occur with exertion, such as when walking up stairs, but not always.  Also can occur when she is just sitting in a chair resting or when she is coloring.  Can last for hours.  Most active thing she does is grocery shopping, has not reported chest pain with this.  Does report she gets short of breath.  She denies any lightheadedness, syncope, lower extremity edema.  She reports palpitations, occurring once per month and lasting for few seconds.  She smoked for 40 years, about 0.5 packs/day, quit age 44.  Family history includes father died of MI at 63 and mother died of MI at 15.  Echocardiogram 01/01/2022 showed normal biventricular function, no significant valvular disease.  Coronary CTA on 01/13/2022 showed calcium score 572 (95th percentile), moderate disease in RCA and LCx, no significant CAD by CT FFR.  She was noted at clinic visit 04/2022 to be in junctional rhythm with rate in 40s.  She was on carvedilol which was discontinued.  Zio patch x 8 days on 04/29/2022 showed episodes of junctional rhythm, but normal rate.  Since last clinic visit, she reports she is doing okay.  Denies any chest pain, dyspnea, lightheadedness, syncope.  Reports some lower extremity  edema.  Reports rare palpitations.  Reports compliance with CPAP.  Has not been exercising.    Past Medical History:  Diagnosis Date   Anxiety    Cataract    Chronic insomnia 10/13/2016   Depression    Gait disorder 03/14/2014   HTN (hypertension)    Hx of cardiovascular stress test    Lexiscan Myoview (1/16):  No ischemia, EF 68%, breast atten; Low Risk   Hyperlipemia    Hyperlipidemia    Phreesia 07/18/2020   Hypertension    [employer]reesia 07/18/2020   Memory difficulty 03/14/2014   Meningitis due to bacteria 01-18-14   OSA (obstructive sleep apnea) 11/30/2016   Seizures    Sleep apnea    [employer]reesia 07/18/2020   TIA (transient ischemic attack)     Past Surgical History:  Procedure Laterality Date   NO PAST SURGERIES      Current Medications: Current Meds  Medication Sig   aspirin 81 MG chewable tablet Chew 81 mg by mouth daily.   carbamazepine (TEGRETOL) 200 MG tablet Take 1.5 tablets twice daily   cholecalciferol (VITAMIN D3) 25 MCG (1000 UT) tablet Take 1,000 Units by mouth daily.   escitalopram (LEXAPRO) 10 MG tablet TAKE 1 TABLET(10 MG) BY MOUTH AT BEDTIME   ezetimibe (ZETIA) 10 MG tablet Take 10 mg by mouth daily.   levETIRAcetam (KEPPRA) 750 MG tablet TAKE 2 TABLETS(1500 MG) BY MOUTH TWICE DAILY   lisinopril-hydrochlorothiazide (ZESTORETIC) 20-25 MG tablet TAKE 1 TABLET BY MOUTH DAILY  metFORMIN (GLUCOPHAGE) 500 MG tablet TAKE 1 TABLET(500 MG) BY MOUTH TWICE DAILY WITH A MEAL   Multiple Vitamins-Minerals (MULTIVITAMIN PO) Take 1 tablet by mouth daily.   nitroGLYCERIN (NITROSTAT) 0.4 MG SL tablet Place 1 tablet (0.4 mg total) under the tongue every 5 (five) minutes as needed for chest pain.   rosuvastatin (CRESTOR) 40 MG tablet Take 1 tablet (40 mg total) by mouth daily.     Allergies:   Zetia [ezetimibe]   Social History   Socioeconomic History   Marital status: Divorced    Spouse name: Not on file   Number of children: 1   Years of education: college   Highest  education level: Some college, no degree  Occupational History   Occupation: Retired Financial planner: [employer]  Tobacco Use   Smoking status: Former    Packs/day: 0.50    Years: 40.00    Additional pack years: 0.00    Total pack years: 20.00    Types: Cigarettes    Quit date: 07/09/2011    Years since quitting: 11.1   Smokeless tobacco: Never  Vaping Use   Vaping Use: Never used  Substance and Sexual Activity   Alcohol use: Yes    Alcohol/week: 0.0 standard drinks of alcohol    Comment: Rare   Drug use: No   Sexual activity: Never    Birth control/protection: Abstinence  Other Topics Concern   Not on file  Social History Narrative   Marital status: divorced x 35 years; not dating; not interested in 2019.      Children: 1 child/son (44); 2 grandchildren; [place]      Lives:  Alone in [place] in [place].      Employment: retired 2008; Administrator, arts.      Tobacco: quit smoking in 2013.  Smoked x many years       Alcohol: none; holidays only      Exercise: none; quit exercising two years ago in 2015.        Seatbelt: 100%; no texting. Does not drive in [address]34 due to seizure activity.       ADLs: no driving in 2876-8115 due to seizure activity; independent with ADLs.  Has cane for long distances      Advanced Directives: none; [name]/sister.  FULL CODE no prolonged measures.         Patient is single lives at home alone, has 1 child   Patient is right handed   Education level is 1 year of college   Caffeine consumption is 2 cups daily   Social Determinants of Health   Financial Resource Strain: Low Risk  (10/12/2021)   Overall Financial Resource Strain (CARDIA)    Difficulty of Paying Living Expenses: Not hard at all  Food Insecurity: No Food Insecurity (10/12/2021)   Hunger Vital Sign    Worried About Running Out of Food in the Last Year: Never true    Ran Out of Food in the Last Year: Never true  Transportation Needs: No Transportation Needs  (10/12/2021)   PRAPARE - Administrator, Civil Service (Medical): No    Lack of Transportation (Non-Medical): No  Physical Activity: Insufficiently Active (10/12/2021)   Exercise Vital Sign    Days of Exercise per Week: 2 days    Minutes of Exercise per Session: 20 min  Stress: No Stress Concern Present (10/12/2021)   [employer] of Occupational Health - Occupational Stress Questionnaire    Feeling of Stress :  Not at all  Social Connections: Moderately Integrated (10/12/2021)   Social Connection and Isolation Panel [NHANES]    Frequency of Communication with Friends and Family: Three times a week    Frequency of Social Gatherings with Friends and Family: Three times a week    Attends Religious Services: More than 4 times per year    Active Member of Clubs or Organizations: Yes    Attends Banker Meetings: 1 to 4 times per year    Marital Status: Divorced     Family History: The patient's family history includes Arthritis in her sister; Cerebral aneurysm in her sister; Heart attack (age of onset: 64) in her father; Heart attack (age of onset: 18) in her mother; Hyperlipidemia in her brother, brother, and brother; Hypertension in her brother, brother, brother, mother, and sister; Kidney disease in her sister; Rheum arthritis in her sister; Stroke in her mother.  ROS:   Please see the history of present illness.     All other systems reviewed and are negative.  EKGs/Labs/Other Studies Reviewed:    The following studies were reviewed today:   EKG:    05/24/22: sinus bradycardia, rate 54, no ST abnormality 04/12/22: Junctional rhythm heart rate 45, no ST abnormality 12/21/2021: Sinus bradycardia, rate 54, first-degree AV block  Recent Labs: 04/08/2022: TSH 1.74 08/04/2022: ALT 14; BUN 24; Creatinine, Ser 0.93; Hemoglobin 10.9; Platelets 197.0; Potassium 4.2; Sodium 140  Recent Lipid Panel    Component Value Date/Time   CHOL 195 04/12/2022 1025   TRIG 78  04/12/2022 1025   HDL 86 04/12/2022 1025   CHOLHDL 2.3 04/12/2022 1025   CHOLHDL 3 12/17/2021 1603   VLDL 23.6 12/17/2021 1603   LDLCALC 95 04/12/2022 1025    Physical Exam:    VS:  BP 107/69 (BP Location: Left Arm, Patient Position: Sitting, Cuff Size: Large)   Pulse 61   Ht 5' 5.5" (1.664 m)   Wt 239 lb 3.2 oz (108.5 kg)   SpO2 97%   BMI 39.20 kg/m     Wt Readings from Last 3 Encounters:  08/17/22 239 lb 3.2 oz (108.5 kg)  08/04/22 237 lb 2 oz (107.6 kg)  05/24/22 242 lb 6.4 oz (110 kg)     GEN: Well nourished, well developed in no acute distress HEENT: Normal NECK: No JVD; No carotid bruits LYMPHATICS: No lymphadenopathy CARDIAC: RRR, no murmurs, rubs, gallops RESPIRATORY:  Clear to auscultation without rales, wheezing or rhonchi  ABDOMEN: Soft, non-tender, non-distended MUSCULOSKELETAL:  No edema; No deformity  SKIN: Warm and dry NEUROLOGIC:  Alert and oriented x 3 PSYCHIATRIC:  Normal affect   ASSESSMENT:    1. Coronary artery disease involving native coronary artery of native heart without angina pectoris   2. Hyperlipidemia, unspecified hyperlipidemia type   3. Essential hypertension   4. Bradycardia   5. Palpitations      PLAN:    CAD: reported chest pain with possible angina, as describes central chest pressure that can occur with exertion, though not reliably brought on by exertion and can also occur at rest.  She does have significant CAD risk factors (age, former tobacco use, hypertension, hyperlipidemia, family history).  Echocardiogram 01/01/2022 showed normal biventricular function, no significant valvular disease.  Coronary CTA on 01/13/2022 showed calcium score 572 (95th percentile), moderate disease in RCA and LCx, no significant CAD by CT FFR. -Continue ASA -Continue rosuvastatin and Zetia  Bradycardia/palpitations: She was noted at clinic visit 04/2022 to be in junctional rhythm with rate  in 40s.  She was on carvedilol which was discontinued.   Reports her fatigue significantly improved since stopping carvedilol.  Zio patch x 8 days on 04/29/2022 showed episodes of junctional rhythm, but normal rate.  Hypertension: On lisinopril-HCTZ 20-25 mg daily.  Appears controlled.  Hyperlipidemia: On rosuvastatin 40 mg daily, LDL 95 on 04/12/2022.  Zetia 10 mg daily added.  Reported worsening palpitations when she started Zetia so she stopped taking.  Not clear that this was related to Zetia use.  Rechallenged with zetia and reports tolerating well.  Check lipid panel  T2DM: A1c 6.4% on 08/04/22.  On metformin  OSA: on CPAP, reports compliance  RTC in 6 months   Medication Adjustments/Labs and Tests Ordered: Current medicines are reviewed at length with the patient today.  Concerns regarding medicines are outlined above.  Orders Placed This Encounter  Procedures   Lipid panel   No orders of the defined types were placed in this encounter.   Patient Instructions  Medication Instructions:  Your physician recommends that you continue on your current medications as directed. Please refer to the Current Medication list given to you today.  *If you need a refill on your cardiac medications before your next appointment, please call your pharmacy*  Lab Work: Lipid today  If you have labs (blood work) drawn today and your tests are completely normal, you will receive your results only by: MyChart Message (if you have MyChart) OR A paper copy in the mail If you have any lab test that is abnormal or we need to change your treatment, we will call you to review the results.  Follow-Up: At [facility], you and your health needs are our priority.  As part of our continuing mission to provide you with exceptional heart care, we have created designated Provider Care Teams.  These Care Teams include your primary Cardiologist (physician) and Advanced Practice Providers (APPs -  Physician Assistants and Nurse Practitioners) who all work  together to provide you with the care you need, when you need it.  We recommend signing up for the patient portal called "MyChart".  Sign up information is provided on this After Visit Summary.  MyChart is used to connect with patients for Virtual Visits (Telemedicine).  Patients are able to view lab/test results, encounter notes, upcoming appointments, etc.  Non-urgent messages can be sent to your provider as well.   To learn more about what you can do with MyChart, go to [email or web address].    Your next appointment:   6 month(s)  Provider:   Little Ishikawa, MD        Signed, Little Ishikawa, MD  08/17/2022 10:23 AM    [facility]

## 2022-08-17 ENCOUNTER — Encounter: Payer: Self-pay | Admitting: Cardiology

## 2022-08-17 ENCOUNTER — Ambulatory Visit: Payer: Medicare Other | Attending: Cardiology | Admitting: Cardiology

## 2022-08-17 ENCOUNTER — Ambulatory Visit: Payer: Medicare Other | Admitting: Cardiology

## 2022-08-17 VITALS — BP 107/69 | HR 61 | Ht 65.5 in | Wt 239.2 lb

## 2022-08-17 DIAGNOSIS — R001 Bradycardia, unspecified: Secondary | ICD-10-CM | POA: Diagnosis not present

## 2022-08-17 DIAGNOSIS — I2510 Atherosclerotic heart disease of native coronary artery without angina pectoris: Secondary | ICD-10-CM | POA: Diagnosis not present

## 2022-08-17 DIAGNOSIS — I10 Essential (primary) hypertension: Secondary | ICD-10-CM | POA: Diagnosis not present

## 2022-08-17 DIAGNOSIS — R002 Palpitations: Secondary | ICD-10-CM

## 2022-08-17 DIAGNOSIS — E785 Hyperlipidemia, unspecified: Secondary | ICD-10-CM | POA: Diagnosis not present

## 2022-08-17 NOTE — Patient Instructions (Signed)
Medication Instructions:  Your physician recommends that you continue on your current medications as directed. Please refer to the Current Medication list given to you today.  *If you need a refill on your cardiac medications before your next appointment, please call your pharmacy*  Lab Work: Lipid today  If you have labs (blood work) drawn today and your tests are completely normal, you will receive your results only by: MyChart Message (if you have MyChart) OR A paper copy in the mail If you have any lab test that is abnormal or we need to change your treatment, we will call you to review the results.  Follow-Up: At [facility], you and your health needs are our priority.  As part of our continuing mission to provide you with exceptional heart care, we have created designated Provider Care Teams.  These Care Teams include your primary Cardiologist (physician) and Advanced Practice Providers (APPs -  Physician Assistants and Nurse Practitioners) who all work together to provide you with the care you need, when you need it.  We recommend signing up for the patient portal called "MyChart".  Sign up information is provided on this After Visit Summary.  MyChart is used to connect with patients for Virtual Visits (Telemedicine).  Patients are able to view lab/test results, encounter notes, upcoming appointments, etc.  Non-urgent messages can be sent to your provider as well.   To learn more about what you can do with MyChart, go to [email or web address].    Your next appointment:   6 month(s)  Provider:   Little Ishikawa, MD

## 2022-08-18 LAB — LIPID PANEL
Chol/HDL Ratio: 2 ratio (ref 0.0–4.4)
Cholesterol, Total: 174 mg/dL (ref 100–199)
HDL: 85 mg/dL (ref >39)
LDL Chol Calc (NIH): 76 mg/dL (ref 0–99)
Triglycerides: 70 mg/dL (ref 0–149)
VLDL Cholesterol Cal: 13 mg/dL (ref 5–40)

## 2022-08-30 ENCOUNTER — Other Ambulatory Visit: Payer: Self-pay | Admitting: Emergency Medicine

## 2022-09-08 ENCOUNTER — Other Ambulatory Visit: Payer: Self-pay | Admitting: Emergency Medicine

## 2022-09-08 DIAGNOSIS — Z1231 Encounter for screening mammogram for malignant neoplasm of breast: Secondary | ICD-10-CM

## 2022-09-28 DIAGNOSIS — G4733 Obstructive sleep apnea (adult) (pediatric): Secondary | ICD-10-CM | POA: Diagnosis not present

## 2022-10-14 ENCOUNTER — Telehealth: Payer: Self-pay

## 2022-10-14 ENCOUNTER — Ambulatory Visit (INDEPENDENT_AMBULATORY_CARE_PROVIDER_SITE_OTHER): Payer: Medicare Other

## 2022-10-14 VITALS — Ht 65.5 in | Wt 239.0 lb

## 2022-10-14 DIAGNOSIS — Z0000 Encounter for general adult medical examination without abnormal findings: Secondary | ICD-10-CM

## 2022-10-14 NOTE — Telephone Encounter (Signed)
Patient inquiring about injection for weight loss due to diabetes.  Also, she is having a lot of unusual weakness, fatigue and shortness of breath that she is contributing to taking metformin.  She has an appt scheduled for 02/2023.  Please advised.  Verlisa Vara N. Aspasia Rude, LPN.

## 2022-10-14 NOTE — Progress Notes (Signed)
I connected with  [name] on 10/14/22 by a audio enabled telemedicine application and verified that I am speaking with the correct person using two identifiers.  Patient Location: Home  Provider Location: Office/Clinic  I discussed the limitations of evaluation and management by telemedicine. The patient expressed understanding and agreed to proceed.  Subjective:   [name] [name] is a 72 y.o. female who presents for Medicare Annual (Subsequent) preventive examination.  Review of Systems     Cardiac Risk Factors include: advanced age (>50men, >36 women);family history of premature cardiovascular disease;dyslipidemia;hypertension;obesity (BMI >30kg/m2);sedentary lifestyle     Objective:    Today's Vitals   10/14/22 1133  Weight: 239 lb (108.4 kg)  Height: 5' 5.5" (1.664 m)  PainSc: 0-No pain   Body mass index is 39.17 kg/m.     10/14/2022   11:37 AM 10/12/2021    1:19 PM 05/18/2018    8:43 AM 05/17/2017    8:57 AM 09/25/2016    6:01 PM 05/27/2015   10:34 AM 03/14/2014   11:21 AM  Advanced Directives  Does Patient Have a Medical Advance Directive? No Yes No No No No No  Type of Furniture conservator/restorer;Living will       Copy of Healthcare Power of Attorney in Chart?  No - copy requested       Would patient like information on creating a medical advance directive? No - Patient declined  No - Patient declined No - Patient declined No - Patient declined      Current Medications (verified) Outpatient Encounter Medications as of 10/14/2022  Medication Sig   aspirin 81 MG chewable tablet Chew 81 mg by mouth daily.   carbamazepine (TEGRETOL) 200 MG tablet Take 1.5 tablets twice daily   cholecalciferol (VITAMIN D3) 25 MCG (1000 UT) tablet Take 1,000 Units by mouth daily.   escitalopram (LEXAPRO) 10 MG tablet TAKE 1 TABLET(10 MG) BY MOUTH AT BEDTIME   ezetimibe (ZETIA) 10 MG tablet Take 10 mg by mouth daily.   levETIRAcetam (KEPPRA) 750 MG  tablet TAKE 2 TABLETS(1500 MG) BY MOUTH TWICE DAILY   lisinopril-hydrochlorothiazide (ZESTORETIC) 20-25 MG tablet TAKE 1 TABLET BY MOUTH DAILY   metFORMIN (GLUCOPHAGE) 500 MG tablet TAKE 1 TABLET(500 MG) BY MOUTH TWICE DAILY WITH A MEAL   Multiple Vitamins-Minerals (MULTIVITAMIN PO) Take 1 tablet by mouth daily.   nitroGLYCERIN (NITROSTAT) 0.4 MG SL tablet Place 1 tablet (0.4 mg total) under the tongue every 5 (five) minutes as needed for chest pain.   rosuvastatin (CRESTOR) 40 MG tablet Take 1 tablet (40 mg total) by mouth daily.   No facility-administered encounter medications on file as of 10/14/2022.    Allergies (verified) Zetia [ezetimibe]   History: Past Medical History:  Diagnosis Date   Anxiety    Cataract    Chronic insomnia 10/13/2016   Depression    Gait disorder 03/14/2014   HTN (hypertension)    Hx of cardiovascular stress test    Lexiscan Myoview (1/16):  No ischemia, EF 68%, breast atten; Low Risk   Hyperlipemia    Hyperlipidemia    Phreesia 07/18/2020   Hypertension    Phreesia 07/18/2020   Memory difficulty 03/14/2014   Meningitis due to bacteria 01-18-14   OSA (obstructive sleep apnea) 11/30/2016   Seizures (HCC)    Sleep apnea    Phreesia 07/18/2020   TIA (transient ischemic attack)    Past Surgical History:  Procedure Laterality Date   NO PAST SURGERIES  Family History  Problem Relation Age of Onset   Heart attack Mother 106   Stroke Mother    Hypertension Mother    Heart attack Father 61   Cerebral aneurysm Sister    Hypertension Sister    Kidney disease Sister        ESRD; HD in last year   Hypertension Brother    Hyperlipidemia Brother    Rheum arthritis Sister    Arthritis Sister    Hypertension Brother    Hyperlipidemia Brother    Hyperlipidemia Brother    Hypertension Brother    Social History   Socioeconomic History   Marital status: Divorced    Spouse name: Not on file   Number of children: 1   Years of education: college    Highest education level: Some college, no degree  Occupational History   Occupation: Retired Financial planner: [employer]  Tobacco Use   Smoking status: Former    Packs/day: 0.50    Years: 40.00    Additional pack years: 0.00    Total pack years: 20.00    Types: Cigarettes    Quit date: 07/09/2011    Years since quitting: 11.2   Smokeless tobacco: Never  Vaping Use   Vaping Use: Never used  Substance and Sexual Activity   Alcohol use: Yes    Alcohol/week: 0.0 standard drinks of alcohol    Comment: Rare   Drug use: No   Sexual activity: Never    Birth control/protection: Abstinence  Other Topics Concern   Not on file  Social History Narrative   Marital status: divorced x 35 years; not dating; not interested in 2019.      Children: 1 child/son (44); 2 grandchildren; [place]      Lives:  Alone in [place] in [place].      Employment: retired 2008; Administrator, arts.      Tobacco: quit smoking in 2013.  Smoked x many years       Alcohol: none; holidays only      Exercise: none; quit exercising two years ago in 2015.        Seatbelt: 100%; no texting. Does not drive in [address]10 due to seizure activity.       ADLs: no driving in [id number] due to seizure activity; independent with ADLs.  Has cane for long distances      Advanced Directives: none; [name]/sister.  FULL CODE no prolonged measures.         Patient is single lives at home alone, has 1 child   Patient is right handed   Education level is 1 year of college   Caffeine consumption is 2 cups daily   Social Determinants of Health   Financial Resource Strain: Low Risk  (10/14/2022)   Overall Financial Resource Strain (CARDIA)    Difficulty of Paying Living Expenses: Not hard at all  Food Insecurity: No Food Insecurity (10/14/2022)   Hunger Vital Sign    Worried About Running Out of Food in the Last Year: Never true    Ran Out of Food in the Last Year: Never true  Transportation Needs: No Transportation  Needs (10/14/2022)   PRAPARE - Administrator, Civil Service (Medical): No    Lack of Transportation (Non-Medical): No  Physical Activity: Inactive (10/14/2022)   Exercise Vital Sign    Days of Exercise per Week: 0 days    Minutes of Exercise per Session: 0 min  Stress: No Stress Concern  Present (10/14/2022)   [employer] of Occupational Health - Occupational Stress Questionnaire    Feeling of Stress : Not at all  Social Connections: Moderately Integrated (10/14/2022)   Social Connection and Isolation Panel [NHANES]    Frequency of Communication with Friends and Family: Three times a week    Frequency of Social Gatherings with Friends and Family: Three times a week    Attends Religious Services: More than 4 times per year    Active Member of Clubs or Organizations: Yes    Attends Banker Meetings: 1 to 4 times per year    Marital Status: Divorced    Tobacco Counseling Counseling given: Not Answered   Clinical Intake:  Pre-visit preparation completed: Yes  Pain : No/denies pain Pain Score: 0-No pain     BMI - recorded: 39.17 Nutritional Status: BMI > 30  Obese Nutritional Risks: None Diabetes: No  How often do you need to have someone help you when you read instructions, pamphlets, or other written materials from your doctor or pharmacy?: 1 - Never What is the last grade level you completed in school?: 1.5 yeas of college  Diabetic? No  Interpreter Needed?: No  Information entered by :: Lonnetta Kniskern N. Rindi Beechy, LPN.   Activities of Daily Living    10/14/2022   11:40 AM  In your present state of health, do you have any difficulty performing the following activities:  Hearing? 0  Vision? 0  Difficulty concentrating or making decisions? 0  Walking or climbing stairs? 1  Comment Dyspnea on Exertion  Dressing or bathing? 0  Doing errands, shopping? 0  Preparing Food and eating ? N  Using the Toilet? N  In the past six months, have you  accidently leaked urine? N  Do you have problems with loss of bowel control? N  Managing your Medications? N  Managing your Finances? N  Housekeeping or managing your Housekeeping? N    Patient Care Team: Georgina Quint, MD as PCP - General (Internal Medicine) Little Ishikawa, MD as PCP - Cardiology (Cardiology) Juluis Pitch, DDS (Dentistry) Huston Foley, MD as Attending Physician (Neurology) York Spaniel, MD (Inactive) as Consulting Physician (Neurology) Hurshel Party, OD as Consulting Physician (Optometry)  Indicate any recent Medical Services you may have received from other than [facility] providers in the past year (date may be approximate).     Assessment:   This is a routine wellness examination for Bryanne.  Hearing/Vision screen Hearing Screening - Comments:: Denies hearing difficulties   Vision Screening - Comments:: Wears rx glasses - up to date with routine eye exams with Dr. Cephus Richer   Dietary issues and exercise activities discussed: Current Exercise Habits: The patient does not participate in regular exercise at present, Exercise limited by: respiratory conditions(s)   Goals Addressed             This Visit's Progress    Client understands the importance of follow-up with providers by attending scheduled visits.        Depression Screen    10/14/2022   11:43 AM 08/04/2022   11:27 AM 04/08/2022    8:37 AM 01/28/2022   10:20 AM 12/17/2021    2:01 PM 10/12/2021    1:20 PM 10/12/2021    1:18 PM  PHQ 2/9 Scores  PHQ - 2 Score 0 0 2 1 2  0 0  PHQ- 9 Score 0  8  8      Fall Risk    10/14/2022  11:37 AM 08/04/2022   11:27 AM 04/08/2022    8:37 AM 01/28/2022   10:20 AM 12/17/2021    2:01 PM  Fall Risk   Falls in the past year? 1 0 0 0 0  Number falls in past yr: 1 0 0 0 0  Injury with Fall? 0 0 0 0 0  Risk for fall due to :  No Fall Risks No Fall Risks No Fall Risks No Fall Risks  Follow up Falls evaluation completed;Education  provided;Falls prevention discussed Falls evaluation completed Falls evaluation completed Falls evaluation completed Falls evaluation completed    FALL RISK PREVENTION PERTAINING TO THE HOME:  Any stairs in or around the home? Yes  If so, are there any without handrails? No  Home free of loose throw rugs in walkways, pet beds, electrical cords, etc? Yes  Adequate lighting in your home to reduce risk of falls? Yes   ASSISTIVE DEVICES UTILIZED TO PREVENT FALLS:  Life alert? No  has security alarms Use of a cane, walker or w/c? Yes  Grab bars in the bathroom? Yes  Shower chair or bench in shower? Yes  Elevated toilet seat or a handicapped toilet? No   TIMED UP AND GO:  Was the test performed? No . Telephonic Visit  Cognitive Function:    08/26/2016    2:03 PM 07/26/2014    8:37 AM  MMSE - Mini Mental State Exam  Orientation to time 5 5  Orientation to Place 5 5  Registration 3 3  Attention/ Calculation 5 5  Recall 3 3  Language- name 2 objects 2 2  Language- repeat 1 1  Language- follow 3 step command 3 3  Language- read & follow direction 1 1  Write a sentence 1 1  Copy design 1 1  Total score 30 30        10/14/2022   11:39 AM 05/28/2019    9:07 AM 05/18/2018    8:57 AM 05/18/2018    8:51 AM 05/17/2017    9:05 AM  6CIT Screen  What Year? 0 points 0 points 0 points 0 points 0 points  What month? 0 points 0 points 0 points 0 points 0 points  What time? 0 points 0 points 0 points 0 points 0 points  Count back from 20 0 points 0 points 0 points 0 points 0 points  Months in reverse 0 points 0 points 0 points 0 points 0 points  Repeat phrase 0 points 0 points 0 points  2 points  Total Score 0 points 0 points 0 points  2 points    Immunizations Immunization History  Administered Date(s) Administered   Influenza, High Dose Seasonal PF 02/03/2018, 12/28/2018   Influenza-Unspecified 02/20/2013, 01/18/2014, 01/09/2016, 02/15/2017, 01/10/2020, 01/16/2022   Meningococcal  Conjugate 10/23/2014   PFIZER(Purple Top)SARS-COV-2 Vaccination 05/25/2019, 06/15/2019, 02/07/2020   Pneumococcal Conjugate-13 04/17/2014   Pneumococcal Polysaccharide-23 04/18/2013, 05/26/2018   Tdap 03/08/2007, 05/28/2019   Zoster Recombinat (Shingrix) 12/28/2017, 03/03/2018   Zoster, Live 05/03/2010    TDAP status: Up to date  Flu Vaccine status: Up to date  Pneumococcal vaccine status: Up to date  Covid-19 vaccine status: Completed vaccines  Qualifies for Shingles Vaccine? Yes   Zostavax completed Yes   Shingrix Completed?: Yes  Screening Tests Health Maintenance  Topic Date Due   COVID-19 Vaccine (4 - 2023-24 season) 01/01/2022   INFLUENZA VACCINE  12/02/2022   Lung Cancer Screening  01/14/2023   Medicare Annual Wellness (AWV)  10/14/2023   MAMMOGRAM  10/22/2023   Colonoscopy  06/09/2025   DTaP/Tdap/Td (3 - Td or Tdap) 05/27/2029   Pneumonia Vaccine 20+ Years old  Completed   DEXA SCAN  Completed   Hepatitis C Screening  Completed   Zoster Vaccines- Shingrix  Completed   HPV VACCINES  Aged Out    Health Maintenance  Health Maintenance Due  Topic Date Due   COVID-19 Vaccine (4 - 2023-24 season) 01/01/2022    Colorectal cancer screening: Type of screening: Colonoscopy. Completed 06/10/2015. Repeat every 10 years  Mammogram status: Completed 10/21/2021. Repeat every year  Bone Density status: Completed 06/16/2017. Results reflect: Bone density results: NORMAL. Repeat every 5 years.  Lung Cancer Screening: (Low Dose CT Chest recommended if Age 7-80 years, 30 pack-year currently smoking OR have quit w/in 15years.) does qualify.   Lung Cancer Screening Referral: due 01/14/2023  Additional Screening:  Hepatitis C Screening: does qualify; Completed 04/23/2015  Vision Screening: Recommended annual ophthalmology exams for early detection of glaucoma and other disorders of the eye. Is the patient up to date with their annual eye exam?  Yes  Who is the provider or  what is the name of the office in which the patient attends annual eye exams? Cephus Richer, OD. If pt is not established with a provider, would they like to be referred to a provider to establish care? No .   Dental Screening: Recommended annual dental exams for proper oral hygiene  Community Resource Referral / Chronic Care Management: CRR required this visit?  No   CCM required this visit?  No      Plan:     I have personally reviewed and noted the following in the patient's chart:   Medical and social history Use of alcohol, tobacco or illicit drugs  Current medications and supplements including opioid prescriptions. Patient is not currently taking opioid prescriptions. Functional ability and status Nutritional status Physical activity Advanced directives List of other physicians Hospitalizations, surgeries, and ER visits in previous 12 months Vitals Screenings to include cognitive, depression, and falls Referrals and appointments  In addition, I have reviewed and discussed with patient certain preventive protocols, quality metrics, and best practice recommendations. A written personalized care plan for preventive services as well as general preventive health recommendations were provided to patient.     Mickeal Needy, LPN   [id number]/[id number]   Nurse Notes: Normal cognitive status assessed by direct observation via telephone conversation by this Nurse Health Advisor. No abnormalities found.

## 2022-10-14 NOTE — Patient Instructions (Addendum)
[name] [name] , Thank you for taking time to come for your Medicare Wellness Visit. I appreciate your ongoing commitment to your health goals. Please review the following plan we discussed and let me know if I can assist you in the future.   These are the goals we discussed:  Goals      Client understands the importance of follow-up with providers by attending scheduled visits.        This is a list of the screening recommended for you and due dates:  Health Maintenance  Topic Date Due   COVID-19 Vaccine (4 - 2023-24 season) 01/01/2022   Flu Shot  12/02/2022   Screening for Lung Cancer  01/14/2023   Medicare Annual Wellness Visit  10/14/2023   Mammogram  10/22/2023   Colon Cancer Screening  06/09/2025   DTaP/Tdap/Td vaccine (3 - Td or Tdap) 05/27/2029   Pneumonia Vaccine  Completed   DEXA scan (bone density measurement)  Completed   Hepatitis C Screening  Completed   Zoster (Shingles) Vaccine  Completed   HPV Vaccine  Aged Out    Advanced directives: No  Conditions/risks identified: Yes; Type II Diabetes  Next appointment: Follow up in one year for your annual wellness visit via telephone call with Nurse Percell Miller on 10/20/2023 at 11:15 a.m.  If you need to cancel or reschedule please call [phone].   Preventive Care 88 Years and Older, Female Preventive care refers to lifestyle choices and visits with your health care provider that can promote health and wellness. What does preventive care include? A yearly physical exam. This is also called an annual well check. Dental exams once or twice a year. Routine eye exams. Ask your health care provider how often you should have your eyes checked. Personal lifestyle choices, including: Daily care of your teeth and gums. Regular physical activity. Eating a healthy diet. Avoiding tobacco and drug use. Limiting alcohol use. Practicing safe sex. Taking low-dose aspirin every day. Taking vitamin and mineral supplements as  recommended by your health care provider. What happens during an annual well check? The services and screenings done by your health care provider during your annual well check will depend on your age, overall health, lifestyle risk factors, and family history of disease. Counseling  Your health care provider may ask you questions about your: Alcohol use. Tobacco use. Drug use. Emotional well-being. Home and relationship well-being. Sexual activity. Eating habits. History of falls. Memory and ability to understand (cognition). Work and work Astronomer. Reproductive health. Screening  You may have the following tests or measurements: Height, weight, and BMI. Blood pressure. Lipid and cholesterol levels. These may be checked every 5 years, or more frequently if you are over 20 years old. Skin check. Lung cancer screening. You may have this screening every year starting at age 31 if you have a 30-pack-year history of smoking and currently smoke or have quit within the past 15 years. Fecal occult blood test (FOBT) of the stool. You may have this test every year starting at age 78. Flexible sigmoidoscopy or colonoscopy. You may have a sigmoidoscopy every 5 years or a colonoscopy every 10 years starting at age 60. Hepatitis C blood test. Hepatitis B blood test. Sexually transmitted disease (STD) testing. Diabetes screening. This is done by checking your blood sugar (glucose) after you have not eaten for a while (fasting). You may have this done every 1-3 years. Bone density scan. This is done to screen for osteoporosis. You may have this done starting  at age 14. Mammogram. This may be done every 1-2 years. Talk to your health care provider about how often you should have regular mammograms. Talk with your health care provider about your test results, treatment options, and if necessary, the need for more tests. Vaccines  Your health care provider may recommend certain vaccines, such  as: Influenza vaccine. This is recommended every year. Tetanus, diphtheria, and acellular pertussis (Tdap, Td) vaccine. You may need a Td booster every 10 years. Zoster vaccine. You may need this after age 30. Pneumococcal 13-valent conjugate (PCV13) vaccine. One dose is recommended after age 45. Pneumococcal polysaccharide (PPSV23) vaccine. One dose is recommended after age 83. Talk to your health care provider about which screenings and vaccines you need and how often you need them. This information is not intended to replace advice given to you by your health care provider. Make sure you discuss any questions you have with your health care provider. Document Released: 05/16/2015 Document Revised: 01/07/2016 Document Reviewed: 02/18/2015 Elsevier Interactive Patient Education  2017 [employer].  Fall Prevention in the Home Falls can cause injuries. They can happen to people of all ages. There are many things you can do to make your home safe and to help prevent falls. What can I do on the outside of my home? Regularly fix the edges of walkways and driveways and fix any cracks. Remove anything that might make you trip as you walk through a door, such as a raised step or threshold. Trim any bushes or trees on the path to your home. Use bright outdoor lighting. Clear any walking paths of anything that might make someone trip, such as rocks or tools. Regularly check to see if handrails are loose or broken. Make sure that both sides of any steps have handrails. Any raised decks and porches should have guardrails on the edges. Have any leaves, snow, or ice cleared regularly. Use sand or salt on walking paths during winter. Clean up any spills in your garage right away. This includes oil or grease spills. What can I do in the bathroom? Use night lights. Install grab bars by the toilet and in the tub and shower. Do not use towel bars as grab bars. Use non-skid mats or decals in the tub or  shower. If you need to sit down in the shower, use a plastic, non-slip stool. Keep the floor dry. Clean up any water that spills on the floor as soon as it happens. Remove soap buildup in the tub or shower regularly. Attach bath mats securely with double-sided non-slip rug tape. Do not have throw rugs and other things on the floor that can make you trip. What can I do in the bedroom? Use night lights. Make sure that you have a light by your bed that is easy to reach. Do not use any sheets or blankets that are too big for your bed. They should not hang down onto the floor. Have a firm chair that has side arms. You can use this for support while you get dressed. Do not have throw rugs and other things on the floor that can make you trip. What can I do in the kitchen? Clean up any spills right away. Avoid walking on wet floors. Keep items that you use a lot in easy-to-reach places. If you need to reach something above you, use a strong step stool that has a grab bar. Keep electrical cords out of the way. Do not use floor polish or wax that makes  floors slippery. If you must use wax, use non-skid floor wax. Do not have throw rugs and other things on the floor that can make you trip. What can I do with my stairs? Do not leave any items on the stairs. Make sure that there are handrails on both sides of the stairs and use them. Fix handrails that are broken or loose. Make sure that handrails are as long as the stairways. Check any carpeting to make sure that it is firmly attached to the stairs. Fix any carpet that is loose or worn. Avoid having throw rugs at the top or bottom of the stairs. If you do have throw rugs, attach them to the floor with carpet tape. Make sure that you have a light switch at the top of the stairs and the bottom of the stairs. If you do not have them, ask someone to add them for you. What else can I do to help prevent falls? Wear shoes that: Do not have high heels. Have  rubber bottoms. Are comfortable and fit you well. Are closed at the toe. Do not wear sandals. If you use a stepladder: Make sure that it is fully opened. Do not climb a closed stepladder. Make sure that both sides of the stepladder are locked into place. Ask someone to hold it for you, if possible. Clearly mark and make sure that you can see: Any grab bars or handrails. First and last steps. Where the edge of each step is. Use tools that help you move around (mobility aids) if they are needed. These include: Canes. Walkers. Scooters. Crutches. Turn on the lights when you go into a dark area. Replace any light bulbs as soon as they burn out. Set up your furniture so you have a clear path. Avoid moving your furniture around. If any of your floors are uneven, fix them. If there are any pets around you, be aware of where they are. Review your medicines with your doctor. Some medicines can make you feel dizzy. This can increase your chance of falling. Ask your doctor what other things that you can do to help prevent falls. This information is not intended to replace advice given to you by your health care provider. Make sure you discuss any questions you have with your health care provider. Document Released: 02/13/2009 Document Revised: 09/25/2015 Document Reviewed: 05/24/2014 Elsevier Interactive Patient Education  2017 [employer].

## 2022-10-14 NOTE — Telephone Encounter (Signed)
It is very unlikely these symptoms are related to metformin  Ok to continue metformin  Please consider ROV with PCP to evaluate symptoms

## 2022-10-15 NOTE — Telephone Encounter (Signed)
Called patient and informed her of provider recommendation. Patient states the the symptoms that she is having is something that has been going on. She states she will talk to  Dr. Gaynelle Arabian about her symptoms. She declined a sooner OV with PCP.

## 2022-10-26 ENCOUNTER — Ambulatory Visit
Admission: RE | Admit: 2022-10-26 | Discharge: 2022-10-26 | Disposition: A | Discharge status: Home / Self Care | Payer: Medicare Other | Source: Ambulatory Visit | Attending: Emergency Medicine | Admitting: Emergency Medicine

## 2022-10-26 DIAGNOSIS — Z1231 Encounter for screening mammogram for malignant neoplasm of breast: Secondary | ICD-10-CM | POA: Diagnosis not present

## 2022-10-28 ENCOUNTER — Ambulatory Visit: Payer: Medicare Other | Admitting: Neurology

## 2022-10-28 ENCOUNTER — Encounter: Payer: Self-pay | Admitting: Neurology

## 2022-10-28 VITALS — BP 119/70 | HR 54 | Ht 65.5 in | Wt 239.0 lb

## 2022-10-28 DIAGNOSIS — G40909 Epilepsy, unspecified, not intractable, without status epilepticus: Secondary | ICD-10-CM

## 2022-10-28 DIAGNOSIS — G4733 Obstructive sleep apnea (adult) (pediatric): Secondary | ICD-10-CM

## 2022-10-28 MED ORDER — CARBAMAZEPINE 200 MG PO TABS: ORAL_TABLET | ORAL | 4 refills | Status: DC

## 2022-10-28 MED ORDER — LEVETIRACETAM 750 MG PO TABS: ORAL_TABLET | ORAL | 4 refills | Status: DC

## 2022-10-28 NOTE — Patient Instructions (Signed)
Continue seizure medications, check labs  Order sleep test evaluation to re-evaluate sleep apnea, see if can get new machine, for now continue using CPAP

## 2022-10-28 NOTE — Progress Notes (Signed)
PATIENT: [name] [name] DOB: May 16, 1950  REASON FOR VISIT: follow up seizures, CPAP HISTORY FROM: patient PRIMARY NEUROLOGIST: Dr. Frances Furbish  HISTORY OF PRESENT ILLNESS: Today 10/28/22 No seizures reported. Remains on carbamazepine 200 mg 1.5 tablets twice daily, Keppra 750 mg 2 tablets twice daily. Now seeing cardiology. Was having fatigue, palpations, heart racing. Placed on Zetia, feeling better. Uses CPAP diligently, but it is uncomfortable. Has caused rashes various places. Often times she takes it off early morning when she wakes up. We think her machine is more than 72 years old. We think since 2018. Using full face mask. Really isn't sure how much help CPAP is doing but she is so used to it, hard to compare, she doesn't miss nights. She is scared to sleep without it. She would be thrilled if she didn't have sleep apnea. In 2018, spilt night sleep study 1. This study demonstrates severe obstructive sleep apnea, with a total AHI of 48.3/hour, REM AHI of 110.9/hour, supine AHI of n/a  and O2 nadir of 58%. ESS 15. Many nights she tosses and turns, has had insomnia her entire adult life. Cut out caffeine. Is on oral vitamin D.  Official CPAP report is attached, overall nightly use greater than 4 hours 97%.  Average usage 6 hours 39 minutes.  Set pressure 13 cm water.  Leak 13.9.  AHI 1.5.  10/29/21 SS: [name] [name] is here today. No seizures. Remains on Keppra and carbamazepine. DM doing well. Last A1C was 6.5. Using CPAP. Having hard time getting mask, straps adjusted. Right now not an issue.  Is faithful to use CPAP nightly review of download of the last 30 days shows 100% compliance, greater than 4 hours 29 days.  Average usage 7 hours 8 minutes.  Set pressure 13.  Leak in the 95th percentile 14.2, AHI 1.6.  Uses full facemask.  Update 10/29/20 SS: [name] [name] is a 72 year old female with history of seizures (staring episodes).  On carbamazepine and Keppra.  Last seizure event was  around November 2021.  Carbamazepine was increased to 300 mg twice a day.  EEG was normal in January 2022. Diagnosed with Type 2 DM, A1C 6.6, started metformin, has lost 20 lbs.   CPAP download is listed below, overall shows excellent compliance, AHI is well treated at 1.0, leak in the 95th percentile is 15.1. Small issue with humidification tub having leaks, sent replacement parts 3 times, all with cracks, wants to go without using humidification at all. Denies any dry mouth. Uses full face mask. Missed 1 night when she was out of town. 2 nights she couldn't wear it well because of 2 molars, keeps having to adjust the mask from this healing. Sometimes seems like a leak.       REVIEW OF SYSTEMS: Out of a complete 14 system review of symptoms, the patient complains only of the following symptoms, and all other reviewed systems are negative.  See HPI  ALLERGIES: Allergies  Allergen Reactions   Zetia [Ezetimibe]     Palpitations     HOME MEDICATIONS: Outpatient Medications Prior to Visit  Medication Sig Dispense Refill   aspirin 81 MG chewable tablet Chew 81 mg by mouth daily.     carbamazepine (TEGRETOL) 200 MG tablet Take 1.5 tablets twice daily 270 tablet 4   cholecalciferol (VITAMIN D3) 25 MCG (1000 UT) tablet Take 1,000 Units by mouth daily.     escitalopram (LEXAPRO) 10 MG tablet TAKE 1 TABLET(10 MG) BY MOUTH AT BEDTIME 90 tablet  1   ezetimibe (ZETIA) 10 MG tablet Take 10 mg by mouth daily.     levETIRAcetam (KEPPRA) 750 MG tablet TAKE 2 TABLETS(1500 MG) BY MOUTH TWICE DAILY 360 tablet 4   lisinopril-hydrochlorothiazide (ZESTORETIC) 20-25 MG tablet TAKE 1 TABLET BY MOUTH DAILY 90 tablet 1   metFORMIN (GLUCOPHAGE) 500 MG tablet TAKE 1 TABLET(500 MG) BY MOUTH TWICE DAILY WITH A MEAL 180 tablet 3   Multiple Vitamins-Minerals (MULTIVITAMIN PO) Take 1 tablet by mouth daily.     nitroGLYCERIN (NITROSTAT) 0.4 MG SL tablet Place 1 tablet (0.4 mg total) under the tongue every 5 (five)  minutes as needed for chest pain. 25 tablet 1   rosuvastatin (CRESTOR) 40 MG tablet Take 1 tablet (40 mg total) by mouth daily. 90 tablet 3   No facility-administered medications prior to visit.    PAST MEDICAL HISTORY: Past Medical History:  Diagnosis Date   Anxiety    Cataract    Chronic insomnia 10/13/2016   Depression    Gait disorder 03/14/2014   HTN (hypertension)    Hx of cardiovascular stress test    Lexiscan Myoview (1/16):  No ischemia, EF 68%, breast atten; Low Risk   Hyperlipemia    Hyperlipidemia    Phreesia 07/18/2020   Hypertension    Phreesia 07/18/2020   Memory difficulty 03/14/2014   Meningitis due to bacteria 01-18-14   OSA (obstructive sleep apnea) 11/30/2016   Seizures (HCC)    Sleep apnea    Phreesia 07/18/2020   TIA (transient ischemic attack)     PAST SURGICAL HISTORY: Past Surgical History:  Procedure Laterality Date   NO PAST SURGERIES      FAMILY HISTORY: Family History  Problem Relation Age of Onset   Heart attack Mother 23   Stroke Mother    Hypertension Mother    Heart attack Father 67   Cerebral aneurysm Sister    Hypertension Sister    Kidney disease Sister        ESRD; HD in last year   Hypertension Brother    Hyperlipidemia Brother    Rheum arthritis Sister    Arthritis Sister    Hypertension Brother    Hyperlipidemia Brother    Hyperlipidemia Brother    Hypertension Brother     SOCIAL HISTORY: Social History   Socioeconomic History   Marital status: Single    Spouse name: Not on file   Number of children: 1   Years of education: college   Highest education level: Some college, no degree  Occupational History   Occupation: Retired Financial planner: [employer]  Tobacco Use   Smoking status: Former    Packs/day: 0.50    Years: 40.00    Additional pack years: 0.00    Total pack years: 20.00    Types: Cigarettes    Quit date: 07/09/2011    Years since quitting: 11.3   Smokeless tobacco: Never  Vaping Use   Vaping Use:  Never used  Substance and Sexual Activity   Alcohol use: Yes    Alcohol/week: 0.0 standard drinks of alcohol    Comment: Rare   Drug use: No   Sexual activity: Never    Birth control/protection: Abstinence  Other Topics Concern   Not on file  Social History Narrative   Marital status: divorced x 35 years; not dating; not interested in 2019.      Children: 1 child/son (44); 2 grandchildren; [place]      Lives:  Alone in townhouse in  [place].      Employment: retired 2008; Administrator, arts.      Tobacco: quit smoking in 2013.  Smoked x many years       Alcohol: none; holidays only      Exercise: none; quit exercising two years ago in 2015.        Seatbelt: 100%; no texting. Does not drive in 2440 due to seizure activity.       ADLs: no driving in 1027-2536 due to seizure activity; independent with ADLs.  Has cane for long distances      Advanced Directives: none; [name]/sister.  FULL CODE no prolonged measures.         Patient is single lives at home alone, has 1 child   Patient is right handed   Education level is 1 year of college   Caffeine consumption is 2 cups daily   Social Determinants of Health   Financial Resource Strain: Low Risk  (10/14/2022)   Overall Financial Resource Strain (CARDIA)    Difficulty of Paying Living Expenses: Not hard at all  Food Insecurity: No Food Insecurity (10/14/2022)   Hunger Vital Sign    Worried About Running Out of Food in the Last Year: Never true    Ran Out of Food in the Last Year: Never true  Transportation Needs: No Transportation Needs (10/14/2022)   PRAPARE - Administrator, Civil Service (Medical): No    Lack of Transportation (Non-Medical): No  Physical Activity: Inactive (10/14/2022)   Exercise Vital Sign    Days of Exercise per Week: 0 days    Minutes of Exercise per Session: 0 min  Stress: No Stress Concern Present (10/14/2022)   [employer] of Occupational Health - Occupational Stress  Questionnaire    Feeling of Stress : Not at all  Social Connections: Moderately Integrated (10/14/2022)   Social Connection and Isolation Panel [NHANES]    Frequency of Communication with Friends and Family: Three times a week    Frequency of Social Gatherings with Friends and Family: Three times a week    Attends Religious Services: More than 4 times per year    Active Member of Clubs or Organizations: Yes    Attends Banker Meetings: 1 to 4 times per year    Marital Status: Divorced  Intimate Partner Violence: Not At Risk (10/14/2022)   Humiliation, Afraid, Rape, and Kick questionnaire    Fear of Current or Ex-Partner: No    Emotionally Abused: No    Physically Abused: No    Sexually Abused: No   PHYSICAL EXAM  Vitals:   10/28/22 1058  BP: 119/70  Pulse: (!) 54  Weight: 239 lb (108.4 kg)  Height: 5' 5.5" (1.664 m)   Body mass index is 39.17 kg/m.  Generalized: Well developed, in no acute distress   Neurological examination  Mentation: Alert oriented to time, place, history taking. Follows all commands speech and language fluent Cranial nerve II-XII: Pupils were equal round reactive to light. Extraocular movements were full, visual field were full on confrontational test. Facial sensation and strength were normal. Head turning and shoulder shrug  were normal and symmetric. Motor: The motor testing reveals 5 over 5 strength of all 4 extremities. Good symmetric motor tone is noted throughout.  Sensory: Sensory testing is intact to soft touch on all 4 extremities. No evidence of extinction is noted.  Coordination: Cerebellar testing reveals good finger-nose-finger and heel-to-shin bilaterally.  Gait and station: Gait is normal.  Reflexes: Deep tendon reflexes are symmetric but decreased throughout  DIAGNOSTIC DATA (LABS, IMAGING, TESTING) - I reviewed patient records, labs, notes, testing and imaging myself where available.  Lab Results  Component Value Date    WBC 2.6 (L) 08/04/2022   HGB 10.9 (L) 08/04/2022   HCT 33.4 (L) 08/04/2022   MCV 80.2 08/04/2022   PLT 197.0 08/04/2022      Component Value Date/Time   NA 140 08/04/2022 1221   NA 142 10/29/2020 1151   K 4.2 08/04/2022 1221   CL 103 08/04/2022 1221   CO2 32 08/04/2022 1221   GLUCOSE 96 08/04/2022 1221   BUN 24 (H) 08/04/2022 1221   BUN 23 10/29/2020 1151   CREATININE 0.93 08/04/2022 1221   CREATININE 0.89 10/28/2015 1004   CALCIUM 9.4 08/04/2022 1221   PROT 6.5 08/04/2022 1221   PROT 6.4 10/29/2020 1151   ALBUMIN 4.3 08/04/2022 1221   ALBUMIN 4.5 10/29/2020 1151   AST 20 08/04/2022 1221   ALT 14 08/04/2022 1221   ALKPHOS 81 08/04/2022 1221   BILITOT 0.3 08/04/2022 1221   BILITOT <0.2 10/29/2020 1151   GFRNONAA 70 04/14/2020 1609   GFRNONAA 79 10/17/2013 1327   GFRAA 81 04/14/2020 1609   GFRAA >89 10/17/2013 1327   Lab Results  Component Value Date   CHOL 174 08/17/2022   HDL 85 08/17/2022   LDLCALC 76 08/17/2022   TRIG 70 08/17/2022   CHOLHDL 2.0 08/17/2022   Lab Results  Component Value Date   HGBA1C 6.4 08/04/2022   Lab Results  Component Value Date   VITAMINB12 413 04/08/2022   Lab Results  Component Value Date   TSH 1.74 04/08/2022   ASSESSMENT AND PLAN 72 y.o. year old female  1.  Episodes of transient alteration of awareness, probable seizures -Doing well, last seizure spell in November 2021 -Continue current dose of Keppra and carbamazepine -Recheck labs today, refills were sent  2.  OSA on CPAP -Has superb compliance, but would benefit from a new CPAP machine with newer technology, potentially benefit and tolerability, also reevaluate her sleep apnea, given that she reports she is lost 20 pounds since her evaluation in 2018 -Will order HST, PSG and proceed with insurance preference -For now we will continue with CPAP use until we determine insurance approval, I believe her CPAP has been in utilization since 2018 -She is currently using CPAP  with a set pressure of 13 cm water, EPR off.  Leak 13.9, AHI 1.5.  Fullface mask.  Margie Ege, AGNP-C, DNP 10/28/2022, 11:06 AM [facility] [address] [place], [place] [address] [phone]

## 2022-10-29 LAB — CBC WITH DIFFERENTIAL/PLATELET
Basophils Absolute: 0 10*3/uL (ref 0.0–0.2)
Basos: 0 %
EOS (ABSOLUTE): 0.1 10*3/uL (ref 0.0–0.4)
Eos: 4 %
Hematocrit: 31.5 % — ABNORMAL LOW (ref 34.0–46.6)
Hemoglobin: 10 g/dL — ABNORMAL LOW (ref 11.1–15.9)
Immature Grans (Abs): 0 10*3/uL (ref 0.0–0.1)
Immature Granulocytes: 0 %
Lymphocytes Absolute: 0.7 10*3/uL (ref 0.7–3.1)
Lymphs: 25 %
MCH: 26 pg — ABNORMAL LOW (ref 26.6–33.0)
MCHC: 31.7 g/dL (ref 31.5–35.7)
MCV: 82 fL (ref 79–97)
Monocytes Absolute: 0.3 10*3/uL (ref 0.1–0.9)
Monocytes: 11 %
Neutrophils Absolute: 1.7 10*3/uL (ref 1.4–7.0)
Neutrophils: 60 %
Platelets: 202 10*3/uL (ref 150–450)
RBC: 3.84 x10E6/uL (ref 3.77–5.28)
RDW: 13.2 % (ref 11.7–15.4)
WBC: 2.9 10*3/uL — ABNORMAL LOW (ref 3.4–10.8)

## 2022-10-29 LAB — COMPREHENSIVE METABOLIC PANEL
ALT: 15 IU/L (ref 0–32)
AST: 16 IU/L (ref 0–40)
Albumin: 4 g/dL (ref 3.8–4.8)
Alkaline Phosphatase: 94 IU/L (ref 44–121)
BUN/Creatinine Ratio: 23 (ref 12–28)
BUN: 22 mg/dL (ref 8–27)
Bilirubin Total: <0.2 mg/dL (ref 0.0–1.2)
CO2: 25 mmol/L (ref 20–29)
Calcium: 8.9 mg/dL (ref 8.7–10.3)
Chloride: 103 mmol/L (ref 96–106)
Creatinine, Ser: 0.95 mg/dL (ref 0.57–1.00)
Globulin, Total: 1.9 g/dL (ref 1.5–4.5)
Glucose: 90 mg/dL (ref 70–99)
Potassium: 4 mmol/L (ref 3.5–5.2)
Sodium: 140 mmol/L (ref 134–144)
Total Protein: 5.9 g/dL — ABNORMAL LOW (ref 6.0–8.5)
eGFR: 64 mL/min/{1.73_m2} (ref >59)

## 2022-10-29 LAB — CARBAMAZEPINE LEVEL, TOTAL: Carbamazepine (Tegretol), S: 8.9 ug/mL (ref 4.0–12.0)

## 2022-10-29 LAB — LEVETIRACETAM LEVEL: Levetiracetam Lvl: 56.6 ug/mL — ABNORMAL HIGH (ref 10.0–40.0)

## 2022-11-15 ENCOUNTER — Telehealth: Payer: Self-pay | Admitting: Cardiology

## 2022-11-15 NOTE — Telephone Encounter (Signed)
  Pt c/o of Chest Pain: STAT if CP now or developed within 24 hours  1. Are you having CP right now? No   2. Are you experiencing any other symptoms (ex. SOB, nausea, vomiting, sweating)? no  3. How long have you been experiencing CP? Last Wednesday and Thursday   4. Is your CP continuous or coming and going? Coming and going   5. Have you taken Nitroglycerin? Yes  ?  Pt states she felt very sick and was hurting under her jaw bone and neck. She states she had a pain in her chest also. She took  1 nitro and it went away in 20 minutes. She asked what could have caused this to happen and if she should f/u with Dr. Bjorn Pippin or APP. Please advise.

## 2022-11-15 NOTE — Telephone Encounter (Signed)
Patient states soreness Tuesday night and then Wednesday the pain was severe under her jaw and neck moving to the shoulder.  Wednesday night sates pain all over "her body and chest" .  She states her torso , appendages, everywhere.  She states "felt like the flue" but no cold symptoms.  She states Wednesday night took the nitroglycerin and the pain in chest went away after 20 minutes, but the rest of body still hurt.  She states Thursday the chest pain had resolved but she still hurt all over again most in the neck and, jaw, shoulder. Friday felt better and by Saturday back to normal.  Does still have the rapid HR with exertion (which has been since last OV).  She states it scared her but after taking the nitroglycerin she felt better about everything.  She did not test for Covid nor Flu. She took Tylenol Wednesday and Three times on Thursday, and twice on Friday.  No reoccurrence of the symptoms and feels great. Advised if this happens again to go to the ED so she can be evaluated and R/O any heart issues.   Please advise for any recommendations

## 2022-11-16 ENCOUNTER — Encounter (HOSPITAL_BASED_OUTPATIENT_CLINIC_OR_DEPARTMENT_OTHER): Payer: Self-pay

## 2022-11-16 ENCOUNTER — Other Ambulatory Visit: Payer: Self-pay

## 2022-11-16 ENCOUNTER — Emergency Department (HOSPITAL_BASED_OUTPATIENT_CLINIC_OR_DEPARTMENT_OTHER): Payer: Medicare Other

## 2022-11-16 ENCOUNTER — Telehealth: Payer: Self-pay | Admitting: Cardiology

## 2022-11-16 ENCOUNTER — Emergency Department (HOSPITAL_BASED_OUTPATIENT_CLINIC_OR_DEPARTMENT_OTHER): Payer: Medicare Other | Admitting: Radiology

## 2022-11-16 ENCOUNTER — Emergency Department (HOSPITAL_BASED_OUTPATIENT_CLINIC_OR_DEPARTMENT_OTHER)
Admission: EM | Admit: 2022-11-16 | Discharge: 2022-11-16 | Disposition: A | Discharge status: Home / Self Care | Payer: Medicare Other | Attending: Emergency Medicine | Admitting: Emergency Medicine

## 2022-11-16 DIAGNOSIS — Z7982 Long term (current) use of aspirin: Secondary | ICD-10-CM | POA: Diagnosis not present

## 2022-11-16 DIAGNOSIS — R0789 Other chest pain: Secondary | ICD-10-CM | POA: Insufficient documentation

## 2022-11-16 DIAGNOSIS — I700 Atherosclerosis of aorta: Secondary | ICD-10-CM | POA: Diagnosis not present

## 2022-11-16 DIAGNOSIS — R918 Other nonspecific abnormal finding of lung field: Secondary | ICD-10-CM | POA: Diagnosis not present

## 2022-11-16 DIAGNOSIS — R0602 Shortness of breath: Secondary | ICD-10-CM | POA: Diagnosis not present

## 2022-11-16 DIAGNOSIS — R079 Chest pain, unspecified: Secondary | ICD-10-CM | POA: Diagnosis not present

## 2022-11-16 DIAGNOSIS — I2510 Atherosclerotic heart disease of native coronary artery without angina pectoris: Secondary | ICD-10-CM | POA: Diagnosis not present

## 2022-11-16 LAB — CBC
HCT: 32 % — ABNORMAL LOW (ref 36.0–46.0)
Hemoglobin: 10.1 g/dL — ABNORMAL LOW (ref 12.0–15.0)
MCH: 26 pg (ref 26.0–34.0)
MCHC: 31.6 g/dL (ref 30.0–36.0)
MCV: 82.5 fL (ref 80.0–100.0)
Platelets: 258 10*3/uL (ref 150–400)
RBC: 3.88 MIL/uL (ref 3.87–5.11)
RDW: 13.2 % (ref 11.5–15.5)
WBC: 3.4 10*3/uL — ABNORMAL LOW (ref 4.0–10.5)
nRBC: 0 % (ref 0.0–0.2)

## 2022-11-16 LAB — BASIC METABOLIC PANEL
Anion gap: 11 (ref 5–15)
BUN: 25 mg/dL — ABNORMAL HIGH (ref 8–23)
CO2: 27 mmol/L (ref 22–32)
Calcium: 9.3 mg/dL (ref 8.9–10.3)
Chloride: 102 mmol/L (ref 98–111)
Creatinine, Ser: 0.95 mg/dL (ref 0.44–1.00)
GFR, Estimated: >60 mL/min (ref >60)
Glucose, Bld: 99 mg/dL (ref 70–99)
Potassium: 4.2 mmol/L (ref 3.5–5.1)
Sodium: 140 mmol/L (ref 135–145)

## 2022-11-16 LAB — TROPONIN I (HIGH SENSITIVITY)
Troponin I (High Sensitivity): 4 ng/L (ref <18)
Troponin I (High Sensitivity): 5 ng/L (ref <18)

## 2022-11-16 MED ORDER — IOHEXOL 350 MG/ML SOLN
100.0000 mL | Freq: Once | INTRAVENOUS | Status: AC | PRN
  Administered 2022-11-16: 75 mL via INTRAVENOUS

## 2022-11-16 NOTE — Telephone Encounter (Signed)
  Per MyChart scheduling message:  Initial Complaint:    Continuing heart palpitations;Terrible pain for two days a n d had to take nitroglycerin pill to ease chest pain. Something must be done to take care of problem. If I can't see Dr Bjorn Pippin then must see another doctor in your office please. It is obvious that I have a blockage that needs to be addressed   Pt c/o of Chest Pain: STAT if CP now or developed within 24 hours  1. Are you having CP right now?   2. Are you experiencing any other symptoms (ex. SOB, nausea, vomiting, sweating)?   3. How long have you been experiencing CP?   4. Is your CP continuous or coming and going?   5. Have you taken Nitroglycerin?    1. No 2. Shortness if breath after each task then it goes away Chest pain comes and goes. As I told two nurses yesterday at your office I took a nitroglycerin pill Wednesday at 8:30 pm for the first time and chest pain went away in about 15 or 20 mins but body pain continued through Thursday. I don't like communicating this way. Can't you just call me?

## 2022-11-16 NOTE — Discharge Instructions (Signed)
You have been seen and discharged from the emergency department.  Your cardiac workup was normal today.  The CT study of your chest showed a small lung nodule but otherwise was normal.  It is recommended that you follow-up with your primary doctor for repeat imaging on the lung nodule.  I spoke with Dr. Wyline Mood, who is on-call for cardiology.  They recommend that you follow-up in the cardiologist this week or next for further evaluation and further testing.  If you have another event of severe pain please be evaluated at a hospital.  You may continue to take the nitroglycerin for relief if needed.  Follow-up with your primary provider for further evaluation and further care. Take home medications as prescribed. If you have any worsening symptoms or further concerns for your health please return to an emergency department for further evaluation.

## 2022-11-16 NOTE — ED Provider Notes (Signed)
[place] EMERGENCY DEPARTMENT AT [facility] Provider Note   CSN: [id number] Arrival date & time: 11/16/22  1405     History  Chief Complaint  Patient presents with   Chest Pain    Azya Octivia Canion is a 72 y.o. female.  HPI   72 year old female presents emergency department with concern for episode of left-sided chest pain.  Patient states that she has had this intermittent left-sided chest/jaw pain for about a year now.  She follows up with Dr. Jerene Pitch, cardiology.  Had a CT AC study and echo done back in August/2023.  She was being managed medically.  1 week ago patient had an extreme episode of left-sided pain that resulted in her crawling upstairs to her bedroom and taking nitroglycerin.  She did not seek help at this time.  That pain resolved over the next day and now she feels generally weak.  She also endorses extreme fatigue, shortness of breath and palpitations with even minimal exertion.  Denies any swelling of her lower extremities.  No back or flank pain.  No history of aneurysm, DVT/PE.  Was unable to get into the cardiology office in the next couple weeks.  No recent fever or illness.  Home Medications Prior to Admission medications   Medication Sig Start Date End Date Taking? Authorizing Provider  aspirin 81 MG chewable tablet Chew 81 mg by mouth daily.    [provider]  carbamazepine (TEGRETOL) 200 MG tablet Take 1.5 tablets twice daily 10/28/22   Glean Salvo, NP  cholecalciferol (VITAMIN D3) 25 MCG (1000 UT) tablet Take 1,000 Units by mouth daily.    [provider]  escitalopram (LEXAPRO) 10 MG tablet TAKE 1 TABLET(10 MG) BY MOUTH AT BEDTIME 08/30/22   Sagardia, Eilleen Kempf, MD  ezetimibe (ZETIA) 10 MG tablet Take 10 mg by mouth daily.    [provider]  levETIRAcetam (KEPPRA) 750 MG tablet TAKE 2 TABLETS(1500 MG) BY MOUTH TWICE DAILY 10/28/22   Glean Salvo, NP  lisinopril-hydrochlorothiazide (ZESTORETIC) 20-25 MG tablet  TAKE 1 TABLET BY MOUTH DAILY 08/30/22   Georgina Quint, MD  metFORMIN (GLUCOPHAGE) 500 MG tablet TAKE 1 TABLET(500 MG) BY MOUTH TWICE DAILY WITH A MEAL 06/29/22   Sagardia, Eilleen Kempf, MD  Multiple Vitamins-Minerals (MULTIVITAMIN PO) Take 1 tablet by mouth daily.    [provider]  nitroGLYCERIN (NITROSTAT) 0.4 MG SL tablet Place 1 tablet (0.4 mg total) under the tongue every 5 (five) minutes as needed for chest pain. 12/21/21   Little Ishikawa, MD  rosuvastatin (CRESTOR) 40 MG tablet Take 1 tablet (40 mg total) by mouth daily. 01/15/22   Little Ishikawa, MD      Allergies    Zetia [ezetimibe]    Review of Systems   Review of Systems  Constitutional:  Positive for fatigue. Negative for fever.  Respiratory:  Positive for chest tightness and shortness of breath. Negative for cough and wheezing.   Cardiovascular:  Positive for chest pain and palpitations. Negative for leg swelling.  Gastrointestinal:  Negative for abdominal pain, diarrhea and vomiting.  Genitourinary:  Negative for flank pain.  Musculoskeletal:  Negative for back pain.  Skin:  Negative for rash.  Neurological:  Negative for headaches.    Physical Exam Updated Vital Signs BP (!) 156/76 (BP Location: Right Arm)   Pulse 62   Temp (!) 97.4 F (36.3 C)   Resp 18   Ht 5' 5.5" (1.664 m)   Wt 108.4 kg  SpO2 99%   BMI 39.17 kg/m  Physical Exam Vitals and nursing note reviewed.  Constitutional:      General: She is not in acute distress.    Appearance: Normal appearance.  HENT:     Head: Normocephalic.     Mouth/Throat:     Mouth: Mucous membranes are moist.  Cardiovascular:     Rate and Rhythm: Normal rate.     Heart sounds: No murmur heard. Pulmonary:     Effort: Pulmonary effort is normal. No respiratory distress.     Breath sounds: Examination of the right-lower field reveals decreased breath sounds. Examination of the left-lower field reveals decreased breath sounds. Decreased  breath sounds present. No rales.  Abdominal:     Palpations: Abdomen is soft.     Tenderness: There is no abdominal tenderness.  Musculoskeletal:     Right lower leg: No edema.     Left lower leg: No edema.  Skin:    General: Skin is warm.  Neurological:     Mental Status: She is alert and oriented to person, place, and time. Mental status is at baseline.  Psychiatric:        Mood and Affect: Mood normal.     ED Results / Procedures / Treatments   Labs (all labs ordered are listed, but only abnormal results are displayed) Labs Reviewed  BASIC METABOLIC PANEL - Abnormal; Notable for the following components:      Result Value   BUN 25 (*)    All other components within normal limits  CBC - Abnormal; Notable for the following components:   WBC 3.4 (*)    Hemoglobin 10.1 (*)    HCT 32.0 (*)    All other components within normal limits  TROPONIN I (HIGH SENSITIVITY)  TROPONIN I (HIGH SENSITIVITY)    EKG EKG Interpretation Date/Time:  Tuesday November 16 2022 14:15:54 EDT Ventricular Rate:  62 PR Interval:  202 QRS Duration:  70 QT Interval:  412 QTC Calculation: 418 R Axis:   34  Text Interpretation: Normal sinus rhythm Normal ECG When compared with ECG of 25-Sep-2016 18:12, No significant change was found Unchanged from previous Confirmed by Coralee Pesa [id number]) on 11/16/2022 3:13:00 PM  Radiology DG Chest 2 View  Result Date: 11/16/2022 CLINICAL DATA:  CP/SOB EXAM: CHEST - 2 VIEW COMPARISON:  CXR 12/17/21 FINDINGS: No pleural effusion. No pneumothorax. No focal airspace opacity. Normal cardiac and mediastinal contours. No radiographically apparent displaced rib fractures. Visualized upper abdomen is unremarkable. Vertebral body heights are. IMPRESSION: No focal airspace opacity. Electronically Signed   By: Lorenza Cambridge M.D.   On: 11/16/2022 15:35    Procedures Procedures    Medications Ordered in ED Medications - No data to display  ED Course/ Medical Decision  Making/ A&P                             Medical Decision Making Amount and/or Complexity of Data Reviewed Labs: ordered. Radiology: ordered.  Risk Prescription drug management.   72 year old female presents emergency department with intermittent left-sided chest pain.  Patient has had a full evaluation with cardiology just about a year ago, CTAC did not show any flow-limiting findings.  However a week ago she had a more prominent episode of left-sided chest pain, resolved with nitro.  Currently she is chest pain-free but endorsing generalized weakness and exertional shortness of breath.  Vitals are normal and stable on arrival.  Physical  exam is reassuring.  EKG shows no ischemic changes.  Troponin is negative with no delta.  CT PE study shows no acute findings.  Discussed with on-call cardiologist, Dr. Wyline Mood who agrees with outpatient follow-up with the cardiology office this week/next week.  There are notes in her chart from telephone calls and attempts to get her into the office.  They will call the office tomorrow.  The nitroglycerin that she has is up-to-date with 1 refill.  Will plan for outpatient cardiology evaluation.  Instructed the patient if she has a recurring episode of severe left-sided chest and that she needs to be evaluated immediately.  Her and her family member understand and agree.  Patient at this time appears safe and stable for discharge and close outpatient follow up. Discharge plan and strict return to ED precautions discussed, patient verbalizes understanding and agreement.        Final Clinical Impression(s) / ED Diagnoses Final diagnoses:  None    Rx / DC Orders ED Discharge Orders     None         Rozelle Logan, DO 11/16/22 1909

## 2022-11-16 NOTE — ED Notes (Signed)
Discharge instructions and follow up care with cardiology reviewed and explained, pt verbalized understanding and had no further questions on d/c. Pt caox4, ambulatory, NAD on d/c.

## 2022-11-16 NOTE — Telephone Encounter (Signed)
Patient is currently in the ER. 

## 2022-11-16 NOTE — Telephone Encounter (Signed)
Can we schedule with APP?

## 2022-11-16 NOTE — Telephone Encounter (Signed)
Message has already been handled in other encounter

## 2022-11-16 NOTE — ED Triage Notes (Signed)
Patient here POV from Home.  Endorses CP intermittently for months. Worsened more recently. Had some CP Wednesday PM that was relieved with SL NTG. Some SOB as well.   No N/V.  NAD Noted during triage. A&Ox4. GCS 15. Ambulatory.

## 2022-11-16 NOTE — Telephone Encounter (Signed)
I attempted to schedule patient.  She argues with me regarding the appointments as there is nothing today. I advised that if she is currently having issues to go to ED.  She states that it resolved but she still has a problem and needs to be seen. I look for apt and soonest is next week.  I had advised to go to ED for immediate answers and then we can follow up in office.  She wants to schedule appt but then refuses as she does not want to see another provider or to go to another office.  She states at this time she will go to ED and worry about what to do regardign follow up later.  She states it is wrong not to be able to be seen by her doctor even though she was advised, could get an appt. Next week with APP.  She declines and ends call.  She is going to ED at this time.

## 2022-11-20 NOTE — Progress Notes (Unsigned)
Cardiology Clinic Note   Date: 11/22/2022 ID: [name] [name], DOB 1950-08-21, MRN [id number]  Primary Cardiologist:  Little Ishikawa, MD  Patient Profile    [name] [name] is a 72 y.o. female who presents to the clinic today for hospital follow up.     Past medical history significant for: Nonobstructive CAD. Coronary CTA with FFR 01/13/2022: Calcium score 572 (95th percentile).  Moderate RCA and LCx disease.  Normal FFR of RCA.  Distal LAD 0.85 FFR, normal with no flow-limiting disease. Dyspnea. Echo 01/01/2022: EF 60 to 65%.  Mild LVH.  Normal RV function.  Mild LAE.  Mild MR. Palpitations. ZIO monitor 04/29/2022: Junctional rhythm present, normal rate.  Junctional rhythm was present +/- 45 seconds of symptomatic patient event.  Patient triggered events corresponded to sinus rhythm with PACs, junctional rhythm. Hypertension. Hyperlipidemia. Lipid panel 08/17/2022: LDL 76, HDL 85, TG 70, total 174. OSA. TIA. Seizure disorder.     History of Present Illness    [name] [name] first evaluated by cardiology on 12/11/2012 for fatigue, dyspnea, chest pressure at the request of Dr. Merla Riches.  Echo showed normal LV/RV function with Grade I DD and mild LAE.  She had a normal stress test.  She was evaluated by cardiology on 01/19/2014 during a hospital admission for meningitis.  She had a positive troponin consistent with demand ischemia.  Echo showed normal LV function with no RWMA.  Underwent outpatient stress testing which was a low risk study without areas of significant ischemia identified.  Patient was first evaluated by Dr. Bjorn Pippin on 12/21/2021 for chest pain and DOE at the request of Dr. Alvy Bimler.  Symptoms were concerning for possible angina and coronary CTA was ordered which showed no flow-limiting disease (detailed above).  Echo showed normal LV/RV function (details above).  Upon follow-up she reported palpitations and fatigue.  ZIO monitoring showed junctional  rhythm with normal rate.  Carvedilol was stopped with significant improvement of fatigue.  Patient also reported improvement in palpitations since stopping Zetia.  Patient was last seen in the office by Dr. Bjorn Pippin on 08/17/2022 for routine follow-up.  She was doing well at that time and no changes were made.  Patient contacted the office on 11/16/2022 with complaints of jaw and neck pain as well as chest pain for which she took NTG x 1 with resolution of pain and 20 minutes.  Per triage notes "Patient states soreness Tuesday night and then Wednesday the pain was severe under her jaw and neck moving to the shoulder.  Wednesday night sates pain all over "her body and chest" .  She states her torso , appendages, everywhere.  She states "felt like the flue" but no cold symptoms.  She states Wednesday night took the nitroglycerin and the pain in chest went away after 20 minutes, but the rest of body still hurt.  She states Thursday the chest pain had resolved but she still hurt all over again most in the neck and, jaw, shoulder. Friday felt better and by Saturday back to normal.  Does still have the rapid HR with exertion (which has been since last OV).  She states it scared her but after taking the nitroglycerin she felt better about everything. She did not test for Covid nor Flu. She took Tylenol Wednesday and Three times on Thursday, and twice on Friday.  No reoccurrence of the symptoms and feels great. Advised if this happens again to go to the ED so she can be evaluated and R/O any  heart issues."  Patient presented to [facility] on 11/16/2022 for complaints as above.  EKG showed no ischemic changes.  Troponin negative x 2.  CTA negative for PE.  Cardiology consulted and in agreement with plan for outpatient follow-up.  ED precautions provided.  Today, patient is here alone. She reports no further chest pain/jaw pain since ED visit. Patient continues to have palpitations described as racing with exertion  that resolves with rest. This is unchanged since last year. She reports she is relatively sedentary since having bacterial meningitis in 2015 secondary to imbalance. It was suggested she walk in her home several times a day but she still feels unsteady. Discussed using a walker while taking walks in her home for stability. She is willing to try this to see if it improves her palpitations. She will occasionally have mild puffiness to bilateral ankles particularly with long car rides.     ROS: All other systems reviewed and are otherwise negative except as noted in History of Present Illness.  Studies Reviewed     EKG not ordered today.         Physical Exam    VS:  BP 130/78 (BP Location: Left Arm, Patient Position: Sitting, Cuff Size: Normal)   Pulse 89   Ht 5\' 5"  (1.651 m)   Wt 239 lb 12.8 oz (108.8 kg)   SpO2 99%   BMI 39.90 kg/m  , BMI Body mass index is 39.9 kg/m.  GEN: Well nourished, well developed, in no acute distress. Neck: No JVD or carotid bruits. Cardiac:  RRR. No murmurs. No rubs or gallops.   Respiratory:  Respirations regular and unlabored. Clear to auscultation without rales, wheezing or rhonchi. GI: Soft, nontender, nondistended. Extremities: Radials/DP/PT 2+ and equal bilaterally. No clubbing or cyanosis. No edema.  Skin: Warm and dry, no rash. Neuro: Strength intact.  Assessment & Plan    Nonobstructive CAD.  Coronary CTA with FFR September 2023 showed calcium score 572, moderate disease in RCA and LCx with no flow-limiting disease.  Recent ED visit July 2024 for chest pain showed no ischemic changes on EKG and troponin negative x 2.  Patient denies further chest pain/jaw pain since ED visit. No further workup indicated at this time. Discussed ED precautions if pain recurs. Continue aspirin, Crestor, Zetia, as needed SL NTG.  Patient not on beta-blocker secondary to fatigue and history of junctional rhythm. Dyspnea.  Echo September 2023 showed normal LV/RV  function, mild LVH, no significant valvular abnormalities.  Patient denies dyspnea. Lungs clear to auscultation on exam.  Hypertension: BP today 130/78. Patient denies headaches, dizziness or vision changes. Continue Zestoretic. Hyperlipidemia.  LDL April 2024 76, improved from December 2023 after restarting Zetia.  Continue rosuvastatin and Zetia.    Disposition: Return for previously scheduled visit with Dr. Bjorn Pippin or sooner as needed.          Signed, Etta Grandchild. [name] Sawin, DNP, NP-C

## 2022-11-22 ENCOUNTER — Encounter: Payer: Self-pay | Admitting: Student

## 2022-11-22 ENCOUNTER — Ambulatory Visit: Payer: Medicare Other | Attending: Student | Admitting: Student

## 2022-11-22 VITALS — BP 130/78 | HR 89 | Ht 65.0 in | Wt 239.8 lb

## 2022-11-22 DIAGNOSIS — I2510 Atherosclerotic heart disease of native coronary artery without angina pectoris: Secondary | ICD-10-CM | POA: Diagnosis not present

## 2022-11-22 DIAGNOSIS — R0609 Other forms of dyspnea: Secondary | ICD-10-CM

## 2022-11-22 DIAGNOSIS — I10 Essential (primary) hypertension: Secondary | ICD-10-CM

## 2022-11-22 DIAGNOSIS — E785 Hyperlipidemia, unspecified: Secondary | ICD-10-CM

## 2022-11-22 NOTE — Patient Instructions (Signed)
Medication Instructions:  No changes to medication *If you need a refill on your cardiac medications before your next appointment, please call your pharmacy*  Lab Work: Non labs ordered today If you have labs (blood work) drawn today and your tests are completely normal, you will receive your results only by: MyChart Message (if you have MyChart) OR A paper copy in the mail If you have any lab test that is abnormal or we need to change your treatment, we will call you to review the results.  Testing/Procedures: none  Follow-Up: At [facility], you and your health needs are our priority.  As part of our continuing mission to provide you with exceptional heart care, we have created designated Provider Care Teams.  These Care Teams include your primary Cardiologist (physician) and Advanced Practice Providers (APPs -  Physician Assistants and Nurse Practitioners) who all work together to provide you with the care you need, when you need it.  We recommend signing up for the patient portal called "MyChart".  Sign up information is provided on this After Visit Summary.  MyChart is used to connect with patients for Virtual Visits (Telemedicine).  Patients are able to view lab/test results, encounter notes, upcoming appointments, etc.  Non-urgent messages can be sent to your provider as well.   To learn more about what you can do with MyChart, go to [email or web address].    Your next appointment:    Little Ishikawa, MD

## 2022-11-26 ENCOUNTER — Telehealth: Payer: Self-pay

## 2022-11-26 NOTE — Telephone Encounter (Signed)
Transition Care Management Unsuccessful Follow-up Telephone Call  Date of discharge and from where:  11/16/2022 [facility]  Attempts:  1st Attempt  Reason for unsuccessful TCM follow-up call:  No answer/busy  [name] [name]  [name] Population Health Community Resource Care Guide   [email or web address]  ?? [phone]   Website: [email or web address]  [email or web address]

## 2022-12-25 ENCOUNTER — Other Ambulatory Visit: Payer: Self-pay | Admitting: Cardiology

## 2022-12-30 ENCOUNTER — Emergency Department (HOSPITAL_BASED_OUTPATIENT_CLINIC_OR_DEPARTMENT_OTHER): Payer: Medicare Other

## 2022-12-30 ENCOUNTER — Encounter (HOSPITAL_BASED_OUTPATIENT_CLINIC_OR_DEPARTMENT_OTHER): Payer: Self-pay | Admitting: Radiology

## 2022-12-30 ENCOUNTER — Emergency Department (HOSPITAL_BASED_OUTPATIENT_CLINIC_OR_DEPARTMENT_OTHER)
Admission: EM | Admit: 2022-12-30 | Discharge: 2022-12-30 | Disposition: A | Discharge status: Home / Self Care | Payer: Medicare Other | Attending: Emergency Medicine | Admitting: Emergency Medicine

## 2022-12-30 ENCOUNTER — Other Ambulatory Visit: Payer: Self-pay

## 2022-12-30 ENCOUNTER — Emergency Department (HOSPITAL_BASED_OUTPATIENT_CLINIC_OR_DEPARTMENT_OTHER): Payer: Medicare Other | Admitting: Radiology

## 2022-12-30 DIAGNOSIS — I959 Hypotension, unspecified: Secondary | ICD-10-CM | POA: Diagnosis not present

## 2022-12-30 DIAGNOSIS — M8580 Other specified disorders of bone density and structure, unspecified site: Secondary | ICD-10-CM | POA: Diagnosis not present

## 2022-12-30 DIAGNOSIS — M47816 Spondylosis without myelopathy or radiculopathy, lumbar region: Secondary | ICD-10-CM | POA: Diagnosis not present

## 2022-12-30 DIAGNOSIS — R001 Bradycardia, unspecified: Secondary | ICD-10-CM | POA: Insufficient documentation

## 2022-12-30 DIAGNOSIS — D72819 Decreased white blood cell count, unspecified: Secondary | ICD-10-CM | POA: Insufficient documentation

## 2022-12-30 DIAGNOSIS — Q630 Accessory kidney: Secondary | ICD-10-CM | POA: Diagnosis not present

## 2022-12-30 DIAGNOSIS — I700 Atherosclerosis of aorta: Secondary | ICD-10-CM | POA: Diagnosis not present

## 2022-12-30 DIAGNOSIS — M545 Low back pain, unspecified: Secondary | ICD-10-CM | POA: Insufficient documentation

## 2022-12-30 LAB — CBC WITH DIFFERENTIAL/PLATELET
Abs Immature Granulocytes: 0.01 10*3/uL (ref 0.00–0.07)
Basophils Absolute: 0 10*3/uL (ref 0.0–0.1)
Basophils Relative: 0 %
Eosinophils Absolute: 0.1 10*3/uL (ref 0.0–0.5)
Eosinophils Relative: 3 %
HCT: 31.9 % — ABNORMAL LOW (ref 36.0–46.0)
Hemoglobin: 10 g/dL — ABNORMAL LOW (ref 12.0–15.0)
Immature Granulocytes: 0 %
Lymphocytes Relative: 24 %
Lymphs Abs: 0.7 10*3/uL (ref 0.7–4.0)
MCH: 25.8 pg — ABNORMAL LOW (ref 26.0–34.0)
MCHC: 31.3 g/dL (ref 30.0–36.0)
MCV: 82.4 fL (ref 80.0–100.0)
Monocytes Absolute: 0.4 10*3/uL (ref 0.1–1.0)
Monocytes Relative: 13 %
Neutro Abs: 1.7 10*3/uL (ref 1.7–7.7)
Neutrophils Relative %: 60 %
Platelets: 201 10*3/uL (ref 150–400)
RBC: 3.87 MIL/uL (ref 3.87–5.11)
RDW: 12.9 % (ref 11.5–15.5)
WBC: 2.8 10*3/uL — ABNORMAL LOW (ref 4.0–10.5)
nRBC: 0 % (ref 0.0–0.2)

## 2022-12-30 LAB — BASIC METABOLIC PANEL
Anion gap: 10 (ref 5–15)
BUN: 39 mg/dL — ABNORMAL HIGH (ref 8–23)
CO2: 28 mmol/L (ref 22–32)
Calcium: 9.2 mg/dL (ref 8.9–10.3)
Chloride: 103 mmol/L (ref 98–111)
Creatinine, Ser: 1.13 mg/dL — ABNORMAL HIGH (ref 0.44–1.00)
GFR, Estimated: 52 mL/min — ABNORMAL LOW (ref >60)
Glucose, Bld: 89 mg/dL (ref 70–99)
Potassium: 3.9 mmol/L (ref 3.5–5.1)
Sodium: 141 mmol/L (ref 135–145)

## 2022-12-30 LAB — TROPONIN I (HIGH SENSITIVITY)
Troponin I (High Sensitivity): 5 ng/L (ref <18)
Troponin I (High Sensitivity): 6 ng/L (ref <18)

## 2022-12-30 MED ORDER — IOHEXOL 350 MG/ML SOLN
100.0000 mL | Freq: Once | INTRAVENOUS | Status: AC | PRN
  Administered 2022-12-30: 100 mL via INTRAVENOUS

## 2022-12-30 MED ORDER — SODIUM CHLORIDE 0.9 % IV BOLUS
500.0000 mL | Freq: Once | INTRAVENOUS | Status: AC
  Administered 2022-12-30: 500 mL via INTRAVENOUS

## 2022-12-30 MED ORDER — ATROPINE SULFATE 1 MG/ML IV SOLN
0.4000 mg | Freq: Once | INTRAVENOUS | Status: DC
Start: 2022-12-30 — End: 2022-12-30

## 2022-12-30 MED ORDER — ATROPINE SULFATE 1 MG/10ML IJ SOSY
0.5000 mg | PREFILLED_SYRINGE | Freq: Once | INTRAMUSCULAR | Status: DC
  Filled 2022-12-30: qty 10

## 2022-12-30 NOTE — ED Notes (Signed)
Patient transported to CT 

## 2022-12-30 NOTE — ED Notes (Signed)
Reviewed AVS/discharge instruction with patient. Time allotted for and all questions answered. Patient is agreeable for d/c and escorted to ed exit by staff.  

## 2022-12-30 NOTE — ED Triage Notes (Signed)
Pt states that she began having lower R sided back pain radiating down her R leg. Denies known trauma. During triage pt has HR of 41. Denies hx of bradycardia, CP, SOB.

## 2022-12-30 NOTE — Discharge Instructions (Signed)
As we discussed, I would like for you to keep a blood pressure log monitoring her blood pressure once in the morning and once in the evening.  Please hold your blood pressure medication to follow-up with your primary care doctor.  Your primary care doctor should be able to look at the records and look at the x-ray and symptoms and possibly order an MRI.  I will defer to his clinical judgment.  You may return to the Emergency Department at any time for any worsening symptoms.

## 2022-12-30 NOTE — ED Provider Notes (Signed)
[place] EMERGENCY DEPARTMENT AT [facility] Provider Note   CSN: [id number] Arrival date & time: 12/30/22  1226     History Chief Complaint  Patient presents with   Sciatica   Bradycardia    [name] [name] is a 72 y.o. female patient with history of hypertension, hyperlipidemia, diabetes, and bradycardia who presents to the emergency department today with lower back pain.  She states that she has been having lower back pain intermittently for some time.  She states that sometimes her pain radiates down both legs.  Over the last couple of days she was having some sharp radiating pain down the right leg but has now since progressed to the left.  She denies any bowel or bladder incontinence.  She denies any injury.  Patient is hypotensive and bradycardic here.  Chart review reveals that the patient has been worked up in the recent months by cardiology for this.  She has had a ultrasound which was normal.  She has also had coronary CTA which did not reveal any flow-limiting disease.  Patient also wore a Zio patch which showed intermittent junctional rhythm however she had a normal rate at that time.  Patient was stopped off carvedilol which seemed to improve her chronic fatigue.  However, patient does still struggle with dyspnea on exertion and typically has to lean over on a shopping cart while she shops secondary to both her back pain and due to fatigue.  Currently, she denies any palpitations, chest pain, and shortness of breath.  HPI     Home Medications Prior to Admission medications   Medication Sig Start Date End Date Taking? Authorizing Provider  aspirin 81 MG chewable tablet Chew 81 mg by mouth daily.    [provider]  carbamazepine (TEGRETOL) 200 MG tablet Take 1.5 tablets twice daily 10/28/22   Glean Salvo, NP  cholecalciferol (VITAMIN D3) 25 MCG (1000 UT) tablet Take 1,000 Units by mouth daily.    [provider]  escitalopram (LEXAPRO) 10  MG tablet TAKE 1 TABLET(10 MG) BY MOUTH AT BEDTIME 08/30/22   Sagardia, Eilleen Kempf, MD  ezetimibe (ZETIA) 10 MG tablet Take 10 mg by mouth daily.    [provider]  levETIRAcetam (KEPPRA) 750 MG tablet TAKE 2 TABLETS(1500 MG) BY MOUTH TWICE DAILY 10/28/22   Glean Salvo, NP  lisinopril-hydrochlorothiazide (ZESTORETIC) 20-25 MG tablet TAKE 1 TABLET BY MOUTH DAILY 08/30/22   Georgina Quint, MD  metFORMIN (GLUCOPHAGE) 500 MG tablet TAKE 1 TABLET(500 MG) BY MOUTH TWICE DAILY WITH A MEAL 06/29/22   Sagardia, Eilleen Kempf, MD  Multiple Vitamins-Minerals (MULTIVITAMIN PO) Take 1 tablet by mouth daily.    [provider]  nitroGLYCERIN (NITROSTAT) 0.4 MG SL tablet Place 1 tablet (0.4 mg total) under the tongue every 5 (five) minutes as needed for chest pain. 12/21/21   Little Ishikawa, MD  rosuvastatin (CRESTOR) 40 MG tablet TAKE 1 TABLET(40 MG) BY MOUTH DAILY 12/27/22   Little Ishikawa, MD      Allergies    Zetia [ezetimibe]    Review of Systems   Review of Systems  All other systems reviewed and are negative.   Physical Exam Updated Vital Signs BP 115/67   Pulse 60   Temp 97.9 F (36.6 C) (Oral)   Resp 15   SpO2 97%  Physical Exam Vitals and nursing note reviewed.  Constitutional:      General: She is not in acute distress.    Appearance: Normal  appearance.  HENT:     Head: Normocephalic and atraumatic.  Eyes:     General:        Right eye: No discharge.        Left eye: No discharge.  Cardiovascular:     Rate and Rhythm: Bradycardia present.     Pulses: Normal pulses.     Heart sounds: Normal heart sounds.  Pulmonary:     Comments: Clear to auscultation bilaterally.  Normal effort.  No respiratory distress.  No evidence of wheezes, rales, or rhonchi heard throughout. Abdominal:     General: Abdomen is flat. Bowel sounds are normal. There is no distension.     Tenderness: There is no abdominal tenderness. There is no guarding or rebound.   Musculoskeletal:        General: Normal range of motion.     Cervical back: Neck supple.     Comments: No midline tenderness over the thoracic or lumbar spine.  There is mild paralumbar muscular tenderness on the left.  Negative straight leg raise bilaterally.  Skin:    General: Skin is warm and dry.     Findings: No rash.  Neurological:     General: No focal deficit present.     Mental Status: She is alert.  Psychiatric:        Mood and Affect: Mood normal.        Behavior: Behavior normal.     ED Results / Procedures / Treatments   Labs (all labs ordered are listed, but only abnormal results are displayed) Labs Reviewed  CBC WITH DIFFERENTIAL/PLATELET - Abnormal; Notable for the following components:      Result Value   WBC 2.8 (*)    Hemoglobin 10.0 (*)    HCT 31.9 (*)    MCH 25.8 (*)    All other components within normal limits  BASIC METABOLIC PANEL - Abnormal; Notable for the following components:   BUN 39 (*)    Creatinine, Ser 1.13 (*)    GFR, Estimated 52 (*)    All other components within normal limits  TROPONIN I (HIGH SENSITIVITY)  TROPONIN I (HIGH SENSITIVITY)    EKG EKG Interpretation Date/Time:  Thursday December 30 2022 12:40:34 EDT Ventricular Rate:  43 PR Interval:    QRS Duration:  72 QT Interval:  436 QTC Calculation: 368 R Axis:   8  Text Interpretation: Sinus bradycardia with occasional junctional escape beat Abnormal ECG When compared with ECG of 16-Nov-2022 14:15, Junctional rhythm has replaced Sinus rhythm Confirmed by Vonita Moss [phone]) on 12/30/2022 12:51:26 PM  Radiology CT Angio Abd/Pel w/ and/or w/o  Result Date: 12/30/2022 CLINICAL DATA:  Rule out abdominal aortic aneurysm. Lower right-sided back pain radiating down the right leg without trauma. EXAM: CTA ABDOMEN AND PELVIS WITHOUT AND WITH CONTRAST TECHNIQUE: Multidetector CT imaging of the abdomen and pelvis was performed using the standard protocol during bolus administration  of intravenous contrast. Multiplanar reconstructed images and MIPs were obtained and reviewed to evaluate the vascular anatomy. RADIATION DOSE REDUCTION: This exam was performed according to the departmental dose-optimization program which includes automated exposure control, adjustment of the mA and/or kV according to patient size and/or use of iterative reconstruction technique. CONTRAST:  OMNIPAQUE IOHEXOL 350 MG/ML SOLN COMPARISON:  CT chest 11/16/2022 FINDINGS: VASCULAR Aorta: Normal caliber aorta without aneurysm, dissection, vasculitis or significant stenosis. Calcification in the aorta. Celiac: Patent without evidence of aneurysm, dissection, vasculitis or significant stenosis. SMA: Patent without evidence of aneurysm, dissection, vasculitis  or significant stenosis. Renals: Duplicated bilateral renal arteries are patent without aneurysm or occlusion. Nephrograms are symmetrical. IMA: Patent without evidence of aneurysm, dissection, vasculitis or significant stenosis. Inflow: Patent without evidence of aneurysm, dissection, vasculitis or significant stenosis. Proximal Outflow: Bilateral common femoral and visualized portions of the superficial and profunda femoral arteries are patent without evidence of aneurysm, dissection, vasculitis or significant stenosis. Veins: No obvious venous abnormality within the limitations of this arterial phase study. Review of the MIP images confirms the above findings. NON-VASCULAR Lower chest: Lung bases are clear. Hepatobiliary: No focal liver abnormality is seen. No gallstones, gallbladder wall thickening, or biliary dilatation. Pancreas: Unremarkable. No pancreatic ductal dilatation or surrounding inflammatory changes. Spleen: Normal in size without focal abnormality. Adrenals/Urinary Tract: Adrenal glands are unremarkable. Kidneys are normal, without renal calculi, focal lesion, or hydronephrosis. Bladder is unremarkable. Stomach/Bowel: Stomach is within normal  limits. Appendix appears normal. No evidence of bowel wall thickening, distention, or inflammatory changes. Lymphatic: No significant lymphadenopathy. Reproductive: Uterus and ovaries are not enlarged. Calcification in the uterus consistent with small fibroid. Other: No abdominal wall hernia or abnormality. No abdominopelvic ascites. Musculoskeletal: No acute or significant osseous findings. Degenerative changes in the spine. IMPRESSION: VASCULAR Aortic atherosclerosis.  No abdominal aortic aneurysm or dissection. NON-VASCULAR No acute process demonstrated in the abdomen or pelvis. Calcified uterine fibroid. Electronically Signed   By: Burman Nieves M.D.   On: 12/30/2022 17:17   DG Lumbar Spine Complete  Result Date: 12/30/2022 CLINICAL DATA:  Low back pain.  No reported history of trauma EXAM: LUMBAR SPINE - COMPLETE 5 VIEW COMPARISON:  None Available. FINDINGS: Five lumbar-type vertebral bodies. Osteopenia. Transitional L5 segment, partial sacralization. There is some sclerosis along the left side. This can be a source of discomfort. There is mild multilevel disc height loss particularly at L1-2 and L2-3. Scattered endplate osteophytes from L1 through L4. No listhesis. Note is made of moderate colonic stool. Overlapping cardiac leads. IMPRESSION: Osteopenia with moderate degenerative changes. Partial sacralization of L5 as well with some sclerosis Electronically Signed   By: Karen Kays M.D.   On: 12/30/2022 15:00    Procedures Procedures    Medications Ordered in ED Medications  sodium chloride 0.9 % bolus 500 mL (0 mLs Intravenous Stopped 12/30/22 1606)  sodium chloride 0.9 % bolus 500 mL (0 mLs Intravenous Stopped 12/30/22 1817)  iohexol (OMNIPAQUE) 350 MG/ML injection 100 mL (100 mLs Intravenous Contrast Given 12/30/22 1645)    ED Course/ Medical Decision Making/ A&P Clinical Course as of 12/30/22 1921  Thu Dec 30, 2022  1541 Spoke with Dr. Wyline Mood with cardiology who feels that the patient  can follow-up in the outpatient setting as long as her blood pressure continues to normalize.  We will hold her blood pressure medications until she follows up with her primary care doctor.  [CF]  1642 After ordering the atropine, her heart rate and blood pressure have greatly improved after the second half liter of fluid.  Will hold on atropine for now. [CF]  1736 CT Angio Abd/Pel w/ and/or w/o I personally ordered and interpreted the study and do not see any evidence of dissection.  I do agree with the radiologist interpretation. [CF]  1737 CBC with Differential(!) There is some leukopenia and mild anemia.  Anemia is at patient's baseline.  There are no other abnormalities. [CF]  1737 Basic metabolic panel(!) Creatinine is slightly elevated.  Otherwise normal. [CF]  1737 Troponin I (High Sensitivity) Initial troponin is normal. [CF]  1853  Troponin I (High Sensitivity) Delta troponin is normal. [CF]  1917 On reevaluation, patient is doing much better.  Her heart rate and blood pressure have normalized.  I went over all labs and imaging with her at the bedside.  Sister was at the bedside as well.  All questions or concerns addressed.  She will hold her blood pressure medication until she follows up with her primary care doctor.  She will also be keeping a blood pressure log as well to monitor her blood pressure and overall symptoms.  She is safe for discharge at this time. [CF]    Clinical Course User Index [CF] Teressa Lower, PA-C   {   Click here for ABCD2, HEART and other calculators  Medical Decision Making Olie Khamiya Lehrer is a 72 y.o. female patient who presents to the emergency department today for further evaluation of lower back pain.  Patient did arrive bradycardic and hypotensive.  I will plan to give her 500 mL of fluids for her hypotension.  Patient does have known bradycardia but I will add some troponin enzymes to further assess for any emergent cardiac causes.  With  regards to her back pain I am suspicious for spinal canal stenosis.  Will start off by getting x-ray of the lumbar spine in addition to some basic labs to evaluate for electrolyte abnormalities that could explain the muscle cramps that she gets in her legs as well.  Patient does take hydrochlorothiazide.  Although she is bradycardic and hypotensive patient is currently asymptomatic.  Patient will hold her blood pressure medications until following up with her primary doctor.  Her vital signs have normalized and she is feeling much better.  I am suspicious that her back pain is related to spinal canal pathology.  There is some suggestive findings on the x-ray such as degenerative changes.  I will have her follow-up with her primary care doctor for this as well as she might need an MRI to further assess.  Strict return precautions were discussed.  She is safe for discharge.  Amount and/or Complexity of Data Reviewed Labs: ordered. Decision-making details documented in ED Course. Radiology: ordered. Decision-making details documented in ED Course.  Risk Prescription drug management.    Final Clinical Impression(s) / ED Diagnoses Final diagnoses:  Acute low back pain, unspecified back pain laterality, unspecified whether sciatica present  Hypotension, unspecified hypotension type  Bradycardia    Rx / DC Orders ED Discharge Orders     None         Jolyn Lent 12/30/22 1921    Rondel Baton, MD 01/04/23 [phone]

## 2022-12-30 NOTE — ED Notes (Addendum)
Provider notified of low HR and BP. Asymptomatic at this time

## 2023-01-05 ENCOUNTER — Encounter: Payer: Self-pay | Admitting: Family Medicine

## 2023-01-05 ENCOUNTER — Ambulatory Visit (INDEPENDENT_AMBULATORY_CARE_PROVIDER_SITE_OTHER): Payer: Medicare Other | Admitting: Family Medicine

## 2023-01-05 VITALS — BP 122/70 | HR 60 | Temp 97.3°F | Ht 65.0 in | Wt 244.0 lb

## 2023-01-05 DIAGNOSIS — R202 Paresthesia of skin: Secondary | ICD-10-CM

## 2023-01-05 DIAGNOSIS — M5136 Other intervertebral disc degeneration, lumbar region: Secondary | ICD-10-CM

## 2023-01-05 DIAGNOSIS — M8580 Other specified disorders of bone density and structure, unspecified site: Secondary | ICD-10-CM

## 2023-01-05 DIAGNOSIS — R200 Anesthesia of skin: Secondary | ICD-10-CM

## 2023-01-05 DIAGNOSIS — I10 Essential (primary) hypertension: Secondary | ICD-10-CM

## 2023-01-05 DIAGNOSIS — R7303 Prediabetes: Secondary | ICD-10-CM | POA: Diagnosis not present

## 2023-01-05 DIAGNOSIS — M5441 Lumbago with sciatica, right side: Secondary | ICD-10-CM

## 2023-01-05 LAB — VITAMIN B12: Vitamin B-12: 388 pg/mL (ref 211–911)

## 2023-01-05 LAB — TSH: TSH: 2.15 u[IU]/mL (ref 0.35–5.50)

## 2023-01-05 NOTE — Patient Instructions (Signed)
Continue to hold lisinopril-hydrochlorothiazide.   Monitor blood pressure at home and bring in your readings to see Dr. Alvy Bimler in 4 weeks.   You will receive a call regarding MRI of your low back.

## 2023-01-05 NOTE — Progress Notes (Signed)
Subjective:     Patient ID: [name] [name], female    DOB: 06-07-1950, 72 y.o.   MRN: [id number]  Chief Complaint  Patient presents with   Hospitalization Follow-up    Doing much better, better than how she was doing a week before she went to ED. Not having much pain in back anymore.     HPI  Discussed the use of AI scribe software for clinical note transcription with the patient, who gave verbal consent to proceed.  History of Present Illness          C/o right low back pain last week. She went to the ED for the same. No injury.  Tingling down her right leg. Feet and toes numb are night, this is not new.   Holding hydrochlorothiazide and lisinopril and has been since last Thursday per ED physician due to hypotension and bradycardia.     Health Maintenance Due  Topic Date Due   INFLUENZA VACCINE  12/02/2022   COVID-19 Vaccine (4 - 2023-24 season) 01/02/2023    Past Medical History:  Diagnosis Date   Anxiety    Cataract    Chronic insomnia 10/13/2016   Depression    Gait disorder 03/14/2014   HTN (hypertension)    Hx of cardiovascular stress test    Lexiscan Myoview (1/16):  No ischemia, EF 68%, breast atten; Low Risk   Hyperlipemia    Hyperlipidemia    Phreesia 07/18/2020   Hypertension    Phreesia 07/18/2020   Memory difficulty 03/14/2014   Meningitis due to bacteria 01-18-14   OSA (obstructive sleep apnea) 11/30/2016   Seizures (HCC)    Sleep apnea    Phreesia 07/18/2020   TIA (transient ischemic attack)     Past Surgical History:  Procedure Laterality Date   NO PAST SURGERIES      Family History  Problem Relation Age of Onset   Heart attack Mother 47   Stroke Mother    Hypertension Mother    Heart attack Father 4   Cerebral aneurysm Sister    Hypertension Sister    Kidney disease Sister        ESRD; HD in last year   Hypertension Brother    Hyperlipidemia Brother    Rheum arthritis Sister    Arthritis Sister    Hypertension Brother     Hyperlipidemia Brother    Hyperlipidemia Brother    Hypertension Brother     Social History   Socioeconomic History   Marital status: Single    Spouse name: Not on file   Number of children: 1   Years of education: college   Highest education level: Some college, no degree  Occupational History   Occupation: Retired Financial planner: [employer]  Tobacco Use   Smoking status: Former    Current packs/day: 0.00    Average packs/day: 0.5 packs/day for 40.0 years (20.0 ttl pk-yrs)    Types: Cigarettes    Start date: 07/09/1971    Quit date: 07/09/2011    Years since quitting: 11.5   Smokeless tobacco: Never  Vaping Use   Vaping status: Never Used  Substance and Sexual Activity   Alcohol use: Not Currently    Comment: Rare   Drug use: No   Sexual activity: Never    Birth control/protection: Abstinence  Other Topics Concern   Not on file  Social History Narrative   Marital status: divorced x 35 years; not dating; not interested in 2019.  Children: 1 child/son (44); 2 grandchildren; [place]      Lives:  Alone in [place] in [place].      Employment: retired 2008; Administrator, arts.      Tobacco: quit smoking in 2013.  Smoked x many years       Alcohol: none; holidays only      Exercise: none; quit exercising two years ago in 2015.        Seatbelt: 100%; no texting. Does not drive in 1610 due to seizure activity.       ADLs: no driving in 9604-5409 due to seizure activity; independent with ADLs.  Has cane for long distances      Advanced Directives: none; [name]/sister.  FULL CODE no prolonged measures.         Patient is single lives at home alone, has 1 child   Patient is right handed   Education level is 1 year of college   Caffeine consumption is 2 cups daily   Social Determinants of Health   Financial Resource Strain: Low Risk  (10/14/2022)   Overall Financial Resource Strain (CARDIA)    Difficulty of Paying Living Expenses: Not hard at all   Food Insecurity: No Food Insecurity (10/14/2022)   Hunger Vital Sign    Worried About Running Out of Food in the Last Year: Never true    Ran Out of Food in the Last Year: Never true  Transportation Needs: No Transportation Needs (10/14/2022)   PRAPARE - Administrator, Civil Service (Medical): No    Lack of Transportation (Non-Medical): No  Physical Activity: Inactive (10/14/2022)   Exercise Vital Sign    Days of Exercise per Week: 0 days    Minutes of Exercise per Session: 0 min  Stress: No Stress Concern Present (10/14/2022)   [employer] of Occupational Health - Occupational Stress Questionnaire    Feeling of Stress : Not at all  Social Connections: Moderately Integrated (10/14/2022)   Social Connection and Isolation Panel [NHANES]    Frequency of Communication with Friends and Family: Three times a week    Frequency of Social Gatherings with Friends and Family: Three times a week    Attends Religious Services: More than 4 times per year    Active Member of Clubs or Organizations: Yes    Attends Banker Meetings: 1 to 4 times per year    Marital Status: Divorced  Intimate Partner Violence: Not At Risk (10/14/2022)   Humiliation, Afraid, Rape, and Kick questionnaire    Fear of Current or Ex-Partner: No    Emotionally Abused: No    Physically Abused: No    Sexually Abused: No    Outpatient Medications Prior to Visit  Medication Sig Dispense Refill   aspirin 81 MG chewable tablet Chew 81 mg by mouth daily.     carbamazepine (TEGRETOL) 200 MG tablet Take 1.5 tablets twice daily 270 tablet 4   cholecalciferol (VITAMIN D3) 25 MCG (1000 UT) tablet Take 1,000 Units by mouth daily.     escitalopram (LEXAPRO) 10 MG tablet TAKE 1 TABLET(10 MG) BY MOUTH AT BEDTIME 90 tablet 1   ezetimibe (ZETIA) 10 MG tablet Take 10 mg by mouth daily.     levETIRAcetam (KEPPRA) 750 MG tablet TAKE 2 TABLETS(1500 MG) BY MOUTH TWICE DAILY 360 tablet 4    lisinopril-hydrochlorothiazide (ZESTORETIC) 20-25 MG tablet TAKE 1 TABLET BY MOUTH DAILY 90 tablet 1   metFORMIN (GLUCOPHAGE) 500 MG tablet TAKE 1 TABLET(500 MG)  BY MOUTH TWICE DAILY WITH A MEAL 180 tablet 3   Multiple Vitamins-Minerals (MULTIVITAMIN PO) Take 1 tablet by mouth daily.     nitroGLYCERIN (NITROSTAT) 0.4 MG SL tablet Place 1 tablet (0.4 mg total) under the tongue every 5 (five) minutes as needed for chest pain. 25 tablet 1   rosuvastatin (CRESTOR) 40 MG tablet TAKE 1 TABLET(40 MG) BY MOUTH DAILY 90 tablet 3   No facility-administered medications prior to visit.    Allergies  Allergen Reactions   Zetia [Ezetimibe]     Palpitations     Review of Systems  Constitutional:  Negative for chills and fever.  Respiratory:  Negative for shortness of breath.   Cardiovascular:  Negative for chest pain, palpitations and leg swelling.  Gastrointestinal:  Negative for abdominal pain, constipation, diarrhea, nausea and vomiting.  Genitourinary:  Negative for dysuria, frequency and urgency.  Musculoskeletal:  Positive for back pain. Negative for falls.  Neurological:  Negative for dizziness and focal weakness.       Objective:    Physical Exam Constitutional:      General: She is not in acute distress.    Appearance: She is not ill-appearing.  HENT:     Mouth/Throat:     Mouth: Mucous membranes are moist.     Pharynx: Oropharynx is clear.  Eyes:     Extraocular Movements: Extraocular movements intact.     Conjunctiva/sclera: Conjunctivae normal.  Cardiovascular:     Rate and Rhythm: Normal rate and regular rhythm.  Pulmonary:     Effort: Pulmonary effort is normal.     Breath sounds: Normal breath sounds.  Musculoskeletal:     Cervical back: Normal, normal range of motion and neck supple.     Thoracic back: Normal.     Lumbar back: No spasms. Normal range of motion. Negative right straight leg raise test and negative left straight leg raise test.     Comments: Good  sensation and ROM of her lumbar spine. TTP over right SI notch   Skin:    General: Skin is warm and dry.  Neurological:     General: No focal deficit present.     Mental Status: She is alert and oriented to person, place, and time.     Motor: No weakness.     Coordination: Coordination normal.     Gait: Gait normal.  Psychiatric:        Mood and Affect: Mood normal.        Behavior: Behavior normal.        Thought Content: Thought content normal.      BP 122/70 (BP Location: Left Arm, Patient Position: Sitting, Cuff Size: Large)   Pulse 60   Temp (!) 97.3 F (36.3 C) (Temporal)   Ht 5\' 5"  (1.651 m)   Wt 244 lb (110.7 kg)   SpO2 98%   BMI 40.60 kg/m  Wt Readings from Last 3 Encounters:  01/05/23 244 lb (110.7 kg)  11/22/22 239 lb 12.8 oz (108.8 kg)  11/16/22 239 lb (108.4 kg)       Assessment & Plan:   Problem List Items Addressed This Visit       Cardiovascular and Mediastinum   HTN (hypertension) (Chronic)     Other   Prediabetes   Other Visit Diagnoses     Low back pain with neuralgia of right sciatic nerve    -  Primary   Relevant Orders   MR LUMBAR SPINE WO CONTRAST   DDD (degenerative disc  disease), lumbar       Relevant Orders   MR LUMBAR SPINE WO CONTRAST   Osteopenia determined by x-ray       Numbness and tingling of both feet       Relevant Orders   TSH (Completed)   Vitamin B12 (Completed)   Iron, TIBC and Ferritin Panel      Low back pain improving but continues having radiculopathy. Lumbar spine X ray shows osteopenia and DDD. MRI ordered.  Continue to hold HTN medication and closely monitor BP at home. Follow up as schedule with PCP in 4 weeks.    I am having Foy [place] maintain her aspirin, Multiple Vitamins-Minerals (MULTIVITAMIN PO), cholecalciferol, nitroGLYCERIN, ezetimibe, metFORMIN, lisinopril-hydrochlorothiazide, escitalopram, levETIRAcetam, carbamazepine, and rosuvastatin.  No orders of the defined types were placed in this  encounter.

## 2023-01-06 LAB — IRON,TIBC AND FERRITIN PANEL
%SAT: 19 % (ref 16–45)
Ferritin: 26 ng/mL (ref 16–288)
Iron: 55 ug/dL (ref 45–160)
TIBC: 294 ug/dL (ref 250–450)

## 2023-01-12 ENCOUNTER — Telehealth: Payer: Self-pay

## 2023-01-12 NOTE — Telephone Encounter (Signed)
Transition Care Management Unsuccessful Follow-up Telephone Call  Date of discharge and from where:  12/30/2022 [facility]  Attempts:  1st Attempt  Reason for unsuccessful TCM follow-up call:  Left voice message  Gideon Burstein [name]  [facility], [facility] Guide Direct Dial: [phone]  Website: [email or web address]

## 2023-01-12 NOTE — Telephone Encounter (Signed)
Transition Care Management Follow-up Telephone Call Date of discharge and from where: 12/30/2022 [facility] How have you been since you were released from the hospital? Patient is feeling better. Any questions or concerns? No  Items Reviewed: Did the pt receive and understand the discharge instructions provided? Yes  Medications obtained and verified?  No medications prescribed. Other? No  Any new allergies since your discharge? No  Dietary orders reviewed? Yes Do you have support at home? Yes   Follow up appointments reviewed:  PCP Hospital f/u appt confirmed? Yes  Scheduled to see Nolon Lennert. Alvy Bimler, MD on 02/03/2023 @ [facility] at [place]. Specialist Hospital f/u appt confirmed? No  Scheduled to see Vickie L. Suezanne Jacquet, NP-C on 01/05/2023 @ [facility] at [place]. Are transportation arrangements needed? No  If their condition worsens, is the pt aware to call PCP or go to the Emergency Dept.? Yes Was the patient provided with contact information for the PCP's office or ED? Yes Was to pt encouraged to call back with questions or concerns? Yes  [name] [name] [name]  [facility], [facility] Guide Direct Dial: [phone]  Website: [email or web address]

## 2023-01-13 ENCOUNTER — Encounter: Payer: Self-pay | Admitting: Family Medicine

## 2023-01-13 NOTE — Telephone Encounter (Signed)
Please advise 

## 2023-01-23 ENCOUNTER — Other Ambulatory Visit: Payer: Self-pay | Admitting: Neurology

## 2023-01-24 ENCOUNTER — Telehealth: Payer: Self-pay | Admitting: Neurology

## 2023-01-24 DIAGNOSIS — G4733 Obstructive sleep apnea (adult) (pediatric): Secondary | ICD-10-CM

## 2023-01-24 NOTE — Telephone Encounter (Signed)
Pt said new CPAP supply, [facility] is requesting a prescription for CPAP supplies and face to face notes. Phone: [phone] Fax: [phone]

## 2023-01-24 NOTE — Telephone Encounter (Signed)
Sent a community message to [employer] at [employer]

## 2023-01-24 NOTE — Telephone Encounter (Signed)
Called and spoke to patient and she states she tried to get CPAP supplies and they told her they had sent a letter saying everything now needed to be through [facility]. I spoke with Baird Lyons RN who assisting in the matter as [facility] is not a typical DME company we use in our office. We are reaching out to the manager of [employer] to see what has happened because I confirmed she still has [employer] as her insurance. We are waiting for a response to see what next steps should be in regards to a new DME.

## 2023-01-31 NOTE — Addendum Note (Signed)
Addended by: Glean Salvo on: 01/31/2023 04:06 PM   Modules accepted: Orders

## 2023-01-31 NOTE — Telephone Encounter (Signed)
Orders sent via community msg to [facility]

## 2023-01-31 NOTE — Telephone Encounter (Signed)
Orders Placed This Encounter  Procedures   For home use only DME continuous positive airway pressure (CPAP)

## 2023-01-31 NOTE — Telephone Encounter (Signed)
Pt is calling because she is in need of her CPAP supplies, and has yet to be contacted by the new DME she is being assigned to.

## 2023-02-03 ENCOUNTER — Ambulatory Visit: Payer: Medicare Other | Admitting: Emergency Medicine

## 2023-02-03 VITALS — BP 136/78 | HR 102 | Temp 98.0°F | Ht 65.0 in | Wt 235.4 lb

## 2023-02-03 DIAGNOSIS — I10 Essential (primary) hypertension: Secondary | ICD-10-CM | POA: Diagnosis not present

## 2023-02-03 DIAGNOSIS — G40909 Epilepsy, unspecified, not intractable, without status epilepticus: Secondary | ICD-10-CM | POA: Diagnosis not present

## 2023-02-03 DIAGNOSIS — R7303 Prediabetes: Secondary | ICD-10-CM

## 2023-02-03 DIAGNOSIS — F5104 Psychophysiologic insomnia: Secondary | ICD-10-CM

## 2023-02-03 DIAGNOSIS — M5432 Sciatica, left side: Secondary | ICD-10-CM | POA: Diagnosis not present

## 2023-02-03 DIAGNOSIS — R002 Palpitations: Secondary | ICD-10-CM

## 2023-02-03 DIAGNOSIS — M5431 Sciatica, right side: Secondary | ICD-10-CM | POA: Diagnosis not present

## 2023-02-03 DIAGNOSIS — E785 Hyperlipidemia, unspecified: Secondary | ICD-10-CM | POA: Diagnosis not present

## 2023-02-03 DIAGNOSIS — R5383 Other fatigue: Secondary | ICD-10-CM

## 2023-02-03 LAB — CBC WITH DIFFERENTIAL/PLATELET
Basophils Absolute: 0 10*3/uL (ref 0.0–0.1)
Basophils Relative: 0.6 % (ref 0.0–3.0)
Eosinophils Absolute: 0.1 10*3/uL (ref 0.0–0.7)
Eosinophils Relative: 2.3 % (ref 0.0–5.0)
HCT: 33.1 % — ABNORMAL LOW (ref 36.0–46.0)
Hemoglobin: 10.4 g/dL — ABNORMAL LOW (ref 12.0–15.0)
Lymphocytes Relative: 24.6 % (ref 12.0–46.0)
Lymphs Abs: 0.8 10*3/uL (ref 0.7–4.0)
MCHC: 31.4 g/dL (ref 30.0–36.0)
MCV: 80.6 fL (ref 78.0–100.0)
Monocytes Absolute: 0.4 10*3/uL (ref 0.1–1.0)
Monocytes Relative: 11.7 % (ref 3.0–12.0)
Neutro Abs: 2 10*3/uL (ref 1.4–7.7)
Neutrophils Relative %: 60.8 % (ref 43.0–77.0)
Platelets: 218 10*3/uL (ref 150.0–400.0)
RBC: 4.1 Mil/uL (ref 3.87–5.11)
RDW: 13.9 % (ref 11.5–15.5)
WBC: 3.3 10*3/uL — ABNORMAL LOW (ref 4.0–10.5)

## 2023-02-03 LAB — COMPREHENSIVE METABOLIC PANEL
ALT: 10 U/L (ref 0–35)
AST: 14 U/L (ref 0–37)
Albumin: 4.1 g/dL (ref 3.5–5.2)
Alkaline Phosphatase: 87 U/L (ref 39–117)
BUN: 31 mg/dL — ABNORMAL HIGH (ref 6–23)
CO2: 31 meq/L (ref 19–32)
Calcium: 9.4 mg/dL (ref 8.4–10.5)
Chloride: 101 meq/L (ref 96–112)
Creatinine, Ser: 1.19 mg/dL (ref 0.40–1.20)
GFR: 45.88 mL/min — ABNORMAL LOW (ref >60.00)
Glucose, Bld: 99 mg/dL (ref 70–99)
Potassium: 3.8 meq/L (ref 3.5–5.1)
Sodium: 139 meq/L (ref 135–145)
Total Bilirubin: 0.3 mg/dL (ref 0.2–1.2)
Total Protein: 6.7 g/dL (ref 6.0–8.3)

## 2023-02-03 LAB — LIPID PANEL
Cholesterol: 163 mg/dL (ref 0–200)
HDL: 74.1 mg/dL (ref >39.00)
LDL Cholesterol: 71 mg/dL (ref 0–99)
NonHDL: 88.72
Total CHOL/HDL Ratio: 2
Triglycerides: 88 mg/dL (ref 0.0–149.0)
VLDL: 17.6 mg/dL (ref 0.0–40.0)

## 2023-02-03 LAB — HEMOGLOBIN A1C: Hgb A1c MFr Bld: 6.4 % (ref 4.6–6.5)

## 2023-02-03 NOTE — Assessment & Plan Note (Signed)
Intermittent and mostly on exertion Has follow-up with cardiologist in 2 weeks. Clinically stable.  No red flag signs or symptoms No syncope or chest pain with palpitations

## 2023-02-03 NOTE — Assessment & Plan Note (Signed)
Chronic and affecting quality of life Sleep hygiene measures discussed Recommend melatonin 10 to 20 mg at bedtime as needed

## 2023-02-03 NOTE — Assessment & Plan Note (Signed)
Chronic and affecting quality of life Lumbar spine MRI scheduled for next week Pain management discussed

## 2023-02-03 NOTE — Assessment & Plan Note (Signed)
Chronic and affecting quality of life Multifactorial Differential diagnosis discussed Clinically stable.  No red flag signs or symptoms Could be side effects from multiple medications

## 2023-02-03 NOTE — Assessment & Plan Note (Signed)
Diet and nutrition discussed.  Lipid profile done today. Continue rosuvastatin 40 mg daily.

## 2023-02-03 NOTE — Progress Notes (Signed)
[name] [name] 72 y.o.   Chief Complaint  Patient presents with   Medical Management of Chronic Issues    f/u appt, concerns about palpatations, rapid heart beat during different task,   Seen in the ED for sciatica , have a MRI scheduled      HISTORY OF PRESENT ILLNESS: This is a 72 y.o. female complaining of intermittent palpitations for the past several weeks particularly when physically active.  Scheduled to see cardiologist later this month. Recently seen by one of my partners for bilateral sciatica.  Scheduled for MRI lumbar spine next week. Still complaining of chronic tiredness. No other complaint or medical concerns today.  HPI   Prior to Admission medications   Medication Sig Start Date End Date Taking? Authorizing Provider  aspirin 81 MG chewable tablet Chew 81 mg by mouth daily.   Yes [provider]  carbamazepine (TEGRETOL) 200 MG tablet TAKE 1 AND 1/2 TABLETS BY MOUTH TWO TIMES A DAY 01/24/23  Yes Glean Salvo, NP  cholecalciferol (VITAMIN D3) 25 MCG (1000 UT) tablet Take 1,000 Units by mouth daily.   Yes [provider]  escitalopram (LEXAPRO) 10 MG tablet TAKE 1 TABLET(10 MG) BY MOUTH AT BEDTIME 08/30/22  Yes Adalin Vanderploeg, Eilleen Kempf, MD  ezetimibe (ZETIA) 10 MG tablet Take 10 mg by mouth daily.   Yes [provider]  levETIRAcetam (KEPPRA) 750 MG tablet TAKE 2 TABLETS(1500 MG) BY MOUTH TWICE DAILY 10/28/22  Yes Glean Salvo, NP  lisinopril-hydrochlorothiazide (ZESTORETIC) 20-25 MG tablet TAKE 1 TABLET BY MOUTH DAILY 08/30/22  Yes Delane Wessinger, Eilleen Kempf, MD  metFORMIN (GLUCOPHAGE) 500 MG tablet TAKE 1 TABLET(500 MG) BY MOUTH TWICE DAILY WITH A MEAL 06/29/22  Yes Marik Sedore, Eilleen Kempf, MD  Multiple Vitamins-Minerals (MULTIVITAMIN PO) Take 1 tablet by mouth daily.   Yes [provider]  nitroGLYCERIN (NITROSTAT) 0.4 MG SL tablet Place 1 tablet (0.4 mg total) under the tongue every 5 (five) minutes as needed for chest pain.  12/21/21  Yes Little Ishikawa, MD  rosuvastatin (CRESTOR) 40 MG tablet TAKE 1 TABLET(40 MG) BY MOUTH DAILY 12/27/22  Yes Little Ishikawa, MD    Allergies  Allergen Reactions   Zetia [Ezetimibe]     Palpitations     Patient Active Problem List   Diagnosis Date Noted   Tiredness 04/08/2022   Dyspnea on exertion 12/17/2021   Seizure disorder (HCC) 05/28/2019   Prediabetes 05/28/2019   Staring episodes 11/24/2017   OSA (obstructive sleep apnea) 11/30/2016   Chronic insomnia 10/13/2016   Transient cerebral ischemia 08/26/2016   Transient memory loss 08/26/2016   Morbid obesity (HCC) 05/31/2016   Glucose intolerance (impaired glucose tolerance) 05/18/2016   Pronation deformity of ankle, acquired 04/19/2014   Generalized anxiety disorder 03/15/2014   Memory difficulty 03/14/2014   Demand myocardial ischemic related infarction 01/23/2014    Class: Diagnosis of   Depression, reactive 10/12/2012   Chronic neutropenia (HCC) 04/14/2012   Dyslipidemia 10/20/2011   HTN (hypertension) 10/20/2011    Past Medical History:  Diagnosis Date   Anxiety    Cataract    Chronic insomnia 10/13/2016   Depression    Gait disorder 03/14/2014   HTN (hypertension)    Hx of cardiovascular stress test    Lexiscan Myoview (1/16):  No ischemia, EF 68%, breast atten; Low Risk   Hyperlipemia    Hyperlipidemia    Phreesia 07/18/2020   Hypertension    [employer]reesia 07/18/2020   Memory difficulty 03/14/2014   Meningitis  due to bacteria 01-18-14   OSA (obstructive sleep apnea) 11/30/2016   Seizures (HCC)    Sleep apnea    [employer]reesia 07/18/2020   TIA (transient ischemic attack)     Past Surgical History:  Procedure Laterality Date   NO PAST SURGERIES      Social History   Socioeconomic History   Marital status: Single    Spouse name: Not on file   Number of children: 1   Years of education: college   Highest education level: Some college, no degree  Occupational History    Occupation: Retired Financial planner: [employer]  Tobacco Use   Smoking status: Former    Current packs/day: 0.00    Average packs/day: 0.5 packs/day for 40.0 years (20.0 ttl pk-yrs)    Types: Cigarettes    Start date: 07/09/1971    Quit date: 07/09/2011    Years since quitting: 11.5   Smokeless tobacco: Never  Vaping Use   Vaping status: Never Used  Substance and Sexual Activity   Alcohol use: Not Currently    Comment: Rare   Drug use: No   Sexual activity: Never    Birth control/protection: Abstinence  Other Topics Concern   Not on file  Social History Narrative   Marital status: divorced x 35 years; not dating; not interested in 2019.      Children: 1 child/son (44); 2 grandchildren; [place]      Lives:  Alone in [place] in [place].      Employment: retired 2008; Administrator, arts.      Tobacco: quit smoking in 2013.  Smoked x many years       Alcohol: none; holidays only      Exercise: none; quit exercising two years ago in 2015.        Seatbelt: 100%; no texting. Does not drive in 9604 due to seizure activity.       ADLs: no driving in 5409-8119 due to seizure activity; independent with ADLs.  Has cane for long distances      Advanced Directives: none; [name]/sister.  FULL CODE no prolonged measures.         Patient is single lives at home alone, has 1 child   Patient is right handed   Education level is 1 year of college   Caffeine consumption is 2 cups daily   Social Determinants of Health   Financial Resource Strain: Low Risk  (02/03/2023)   Overall Financial Resource Strain (CARDIA)    Difficulty of Paying Living Expenses: Not hard at all  Food Insecurity: No Food Insecurity (02/03/2023)   Hunger Vital Sign    Worried About Running Out of Food in the Last Year: Never true    Ran Out of Food in the Last Year: Never true  Transportation Needs: No Transportation Needs (02/03/2023)   PRAPARE - Administrator, Civil Service (Medical): No     Lack of Transportation (Non-Medical): No  Physical Activity: Inactive (02/03/2023)   Exercise Vital Sign    Days of Exercise per Week: 0 days    Minutes of Exercise per Session: 0 min  Stress: Stress Concern Present (02/03/2023)   [employer] of Occupational Health - Occupational Stress Questionnaire    Feeling of Stress : To some extent  Social Connections: Moderately Integrated (02/03/2023)   Social Connection and Isolation Panel [NHANES]    Frequency of Communication with Friends and Family: More than three times a week    Frequency  of Social Gatherings with Friends and Family: Twice a week    Attends Religious Services: More than 4 times per year    Active Member of [employer] or Organizations: Yes    Attends Engineer, structural: More than 4 times per year    Marital Status: Divorced  Intimate Partner Violence: Not At Risk (10/14/2022)   Humiliation, Afraid, Rape, and Kick questionnaire    Fear of Current or Ex-Partner: No    Emotionally Abused: No    Physically Abused: No    Sexually Abused: No    Family History  Problem Relation Age of Onset   Heart attack Mother 70   Stroke Mother    Hypertension Mother    Heart attack Father 59   Cerebral aneurysm Sister    Hypertension Sister    Kidney disease Sister        ESRD; HD in last year   Hypertension Brother    Hyperlipidemia Brother    Rheum arthritis Sister    Arthritis Sister    Hypertension Brother    Hyperlipidemia Brother    Hyperlipidemia Brother    Hypertension Brother      Review of Systems  Constitutional:  Positive for malaise/fatigue. Negative for chills and fever.  HENT: Negative.  Negative for congestion and sore throat.   Respiratory: Negative.  Negative for cough and shortness of breath.   Cardiovascular:  Positive for palpitations.  Gastrointestinal:  Negative for abdominal pain, diarrhea, nausea and vomiting.  Genitourinary: Negative.  Negative for dysuria and hematuria.  Skin: Negative.   Negative for rash.  Neurological: Negative.  Negative for dizziness and headaches.  All other systems reviewed and are negative.   Vitals:   02/03/23 0943  BP: 136/78  Pulse: (!) 102  Temp: 98 F (36.7 C)  SpO2: 96%    Physical Exam Vitals reviewed.  Constitutional:      Appearance: Normal appearance. She is obese.  HENT:     Head: Normocephalic.     Mouth/Throat:     Mouth: Mucous membranes are moist.     Pharynx: Oropharynx is clear.  Eyes:     Extraocular Movements: Extraocular movements intact.     Pupils: Pupils are equal, round, and reactive to light.  Cardiovascular:     Rate and Rhythm: Normal rate and regular rhythm.     Pulses: Normal pulses.     Heart sounds: Normal heart sounds.  Pulmonary:     Effort: Pulmonary effort is normal.     Breath sounds: Normal breath sounds.  Musculoskeletal:     Cervical back: No tenderness.  Lymphadenopathy:     Cervical: No cervical adenopathy.  Skin:    General: Skin is warm and dry.     Capillary Refill: Capillary refill takes less than 2 seconds.  Neurological:     General: No focal deficit present.     Mental Status: She is alert and oriented to person, place, and time.  Psychiatric:        Mood and Affect: Mood normal.        Behavior: Behavior normal.      ASSESSMENT & PLAN: A total of 46 minutes was spent with the patient and counseling/coordination of care regarding preparing for this visit, review of most recent office visit notes, review of multiple chronic medical conditions under management, review of most recent blood work results, review of all medications, cardiovascular risks associated with hypertension and dyslipidemia, education on nutrition, differential diagnosis of general tiredness, prognosis,  documentation and need for follow-up.  Problem List Items Addressed This Visit       Cardiovascular and Mediastinum   HTN (hypertension) (Chronic)    BP Readings from Last 3 Encounters:  02/03/23  136/78  01/05/23 122/70  12/30/22 115/67  Well-controlled hypertension Continue Zestoretic 20-25 mg daily Cardiovascular risks associated with hypertension discussed Dietary approaches to stop hypertension discussed       Relevant Orders   CBC with Differential/Platelet   Comprehensive metabolic panel   Hemoglobin A1c   Lipid panel     Nervous and Auditory   Seizure disorder (HCC)    Stable and well-controlled Continues carbamazepine and Keppra.  Medications managed by neurologist      Relevant Orders   CBC with Differential/Platelet   Comprehensive metabolic panel   Hemoglobin A1c   Lipid panel   Bilateral sciatica    Chronic and affecting quality of life Lumbar spine MRI scheduled for next week Pain management discussed      Relevant Orders   CBC with Differential/Platelet   Comprehensive metabolic panel   Hemoglobin A1c   Lipid panel     Other   Dyslipidemia    Diet and nutrition discussed Lipid profile done today Continue rosuvastatin 40 mg daily       Morbid obesity (HCC)    Diet and nutrition discussed Advised to decrease amount of daily carbohydrate intake and daily calories and increase amount of plant-based protein in her diet      Chronic insomnia    Chronic and affecting quality of life Sleep hygiene measures discussed Recommend melatonin 10 to 20 mg at bedtime as needed      Prediabetes    Inquiring about metformin and side effects.  Wishes to stop. Hemoglobin A1c done today. Diet and nutrition discussed Okay with stopping metformin for a while and monitor symptoms      Palpitations - Primary    Intermittent and mostly on exertion Has follow-up with cardiologist in 2 weeks. Clinically stable.  No red flag signs or symptoms No syncope or chest pain with palpitations      Relevant Orders   CBC with Differential/Platelet   Comprehensive metabolic panel   Hemoglobin A1c   Lipid panel   Tiredness    Chronic and affecting quality of  life Multifactorial Differential diagnosis discussed Clinically stable.  No red flag signs or symptoms Could be side effects from multiple medications       Relevant Orders   CBC with Differential/Platelet   Comprehensive metabolic panel   Hemoglobin A1c   Lipid panel   Patient Instructions  Health Maintenance After Age 59 After age 60, you are at a higher risk for certain long-term diseases and infections as well as injuries from falls. Falls are a major cause of broken bones and head injuries in people who are older than age 62. Getting regular preventive care can help to keep you healthy and well. Preventive care includes getting regular testing and making lifestyle changes as recommended by your health care provider. Talk with your health care provider about: Which screenings and tests you should have. A screening is a test that checks for a disease when you have no symptoms. A diet and exercise plan that is right for you. What should I know about screenings and tests to prevent falls? Screening and testing are the best ways to find a health problem early. Early diagnosis and treatment give you the best chance of managing medical conditions that are common after  age 54. Certain conditions and lifestyle choices may make you more likely to have a fall. Your health care provider may recommend: Regular vision checks. Poor vision and conditions such as cataracts can make you more likely to have a fall. If you wear glasses, make sure to get your prescription updated if your vision changes. Medicine review. Work with your health care provider to regularly review all of the medicines you are taking, including over-the-counter medicines. Ask your health care provider about any side effects that may make you more likely to have a fall. Tell your health care provider if any medicines that you take make you feel dizzy or sleepy. Strength and balance checks. Your health care provider may recommend  certain tests to check your strength and balance while standing, walking, or changing positions. Foot health exam. Foot pain and numbness, as well as not wearing proper footwear, can make you more likely to have a fall. Screenings, including: Osteoporosis screening. Osteoporosis is a condition that causes the bones to get weaker and break more easily. Blood pressure screening. Blood pressure changes and medicines to control blood pressure can make you feel dizzy. Depression screening. You may be more likely to have a fall if you have a fear of falling, feel depressed, or feel unable to do activities that you used to do. Alcohol use screening. Using too much alcohol can affect your balance and may make you more likely to have a fall. Follow these instructions at home: Lifestyle Do not drink alcohol if: Your health care provider tells you not to drink. If you drink alcohol: Limit how much you have to: 0-1 drink a day for women. 0-2 drinks a day for men. Know how much alcohol is in your drink. In the [place] one drink equals one 12 oz bottle of beer (355 mL), one 5 oz glass of wine (148 mL), or one 1 oz glass of hard liquor (44 mL). Do not use any products that contain nicotine or tobacco. These products include cigarettes, chewing tobacco, and vaping devices, such as e-cigarettes. If you need help quitting, ask your health care provider. Activity  Follow a regular exercise program to stay fit. This will help you maintain your balance. Ask your health care provider what types of exercise are appropriate for you. If you need a cane or walker, use it as recommended by your health care provider. Wear supportive shoes that have nonskid soles. Safety  Remove any tripping hazards, such as rugs, cords, and clutter. Install safety equipment such as grab bars in bathrooms and safety rails on stairs. Keep rooms and walkways well-lit. General instructions Talk with your health care provider about your  risks for falling. Tell your health care provider if: You fall. Be sure to tell your health care provider about all falls, even ones that seem minor. You feel dizzy, tiredness (fatigue), or off-balance. Take over-the-counter and prescription medicines only as told by your health care provider. These include supplements. Eat a healthy diet and maintain a healthy weight. A healthy diet includes low-fat dairy products, low-fat (lean) meats, and fiber from whole grains, beans, and lots of fruits and vegetables. Stay current with your vaccines. Schedule regular health, dental, and eye exams. Summary Having a healthy lifestyle and getting preventive care can help to protect your health and wellness after age 41. Screening and testing are the best way to find a health problem early and help you avoid having a fall. Early diagnosis and treatment give you the best chance  for managing medical conditions that are more common for people who are older than age 63. Falls are a major cause of broken bones and head injuries in people who are older than age 70. Take precautions to prevent a fall at home. Work with your health care provider to learn what changes you can make to improve your health and wellness and to prevent falls. This information is not intended to replace advice given to you by your health care provider. Make sure you discuss any questions you have with your health care provider. Document Revised: 09/08/2020 Document Reviewed: 09/08/2020 Elsevier Patient Education  2024 Elsevier Inc.    Edwina Barth, MD [facility]

## 2023-02-03 NOTE — Assessment & Plan Note (Signed)
Diet and nutrition discussed.  Advised to decrease amount of daily carbohydrate intake and daily calories and increase amount of plant-based protein in her diet.

## 2023-02-03 NOTE — Assessment & Plan Note (Signed)
Inquiring about metformin and side effects.  Wishes to stop. Hemoglobin A1c done today. Diet and nutrition discussed Okay with stopping metformin for a while and monitor symptoms

## 2023-02-03 NOTE — Assessment & Plan Note (Signed)
Stable and well-controlled Continues carbamazepine and Keppra.  Medications managed by neurologist

## 2023-02-03 NOTE — Patient Instructions (Signed)
Health Maintenance After Age 72 After age 72, you are at a higher risk for certain long-term diseases and infections as well as injuries from falls. Falls are a major cause of broken bones and head injuries in people who are older than age 72. Getting regular preventive care can help to keep you healthy and well. Preventive care includes getting regular testing and making lifestyle changes as recommended by your health care provider. Talk with your health care provider about: Which screenings and tests you should have. A screening is a test that checks for a disease when you have no symptoms. A diet and exercise plan that is right for you. What should I know about screenings and tests to prevent falls? Screening and testing are the best ways to find a health problem early. Early diagnosis and treatment give you the best chance of managing medical conditions that are common after age 72. Certain conditions and lifestyle choices may make you more likely to have a fall. Your health care provider may recommend: Regular vision checks. Poor vision and conditions such as cataracts can make you more likely to have a fall. If you wear glasses, make sure to get your prescription updated if your vision changes. Medicine review. Work with your health care provider to regularly review all of the medicines you are taking, including over-the-counter medicines. Ask your health care provider about any side effects that may make you more likely to have a fall. Tell your health care provider if any medicines that you take make you feel dizzy or sleepy. Strength and balance checks. Your health care provider may recommend certain tests to check your strength and balance while standing, walking, or changing positions. Foot health exam. Foot pain and numbness, as well as not wearing proper footwear, can make you more likely to have a fall. Screenings, including: Osteoporosis screening. Osteoporosis is a condition that causes  the bones to get weaker and break more easily. Blood pressure screening. Blood pressure changes and medicines to control blood pressure can make you feel dizzy. Depression screening. You may be more likely to have a fall if you have a fear of falling, feel depressed, or feel unable to do activities that you used to do. Alcohol use screening. Using too much alcohol can affect your balance and may make you more likely to have a fall. Follow these instructions at home: Lifestyle Do not drink alcohol if: Your health care provider tells you not to drink. If you drink alcohol: Limit how much you have to: 0-1 drink a day for women. 0-2 drinks a day for men. Know how much alcohol is in your drink. In the [place] one drink equals one 12 oz bottle of beer (355 mL), one 5 oz glass of wine (148 mL), or one 1 oz glass of hard liquor (44 mL). Do not use any products that contain nicotine or tobacco. These products include cigarettes, chewing tobacco, and vaping devices, such as e-cigarettes. If you need help quitting, ask your health care provider. Activity  Follow a regular exercise program to stay fit. This will help you maintain your balance. Ask your health care provider what types of exercise are appropriate for you. If you need a cane or walker, use it as recommended by your health care provider. Wear supportive shoes that have nonskid soles. Safety  Remove any tripping hazards, such as rugs, cords, and clutter. Install safety equipment such as grab bars in bathrooms and safety rails on stairs. Keep rooms and walkways   well-lit. General instructions Talk with your health care provider about your risks for falling. Tell your health care provider if: You fall. Be sure to tell your health care provider about all falls, even ones that seem minor. You feel dizzy, tiredness (fatigue), or off-balance. Take over-the-counter and prescription medicines only as told by your health care provider. These include  supplements. Eat a healthy diet and maintain a healthy weight. A healthy diet includes low-fat dairy products, low-fat (lean) meats, and fiber from whole grains, beans, and lots of fruits and vegetables. Stay current with your vaccines. Schedule regular health, dental, and eye exams. Summary Having a healthy lifestyle and getting preventive care can help to protect your health and wellness after age 72. Screening and testing are the best way to find a health problem early and help you avoid having a fall. Early diagnosis and treatment give you the best chance for managing medical conditions that are more common for people who are older than age 72. Falls are a major cause of broken bones and head injuries in people who are older than age 72. Take precautions to prevent a fall at home. Work with your health care provider to learn what changes you can make to improve your health and wellness and to prevent falls. This information is not intended to replace advice given to you by your health care provider. Make sure you discuss any questions you have with your health care provider. Document Revised: 09/08/2020 Document Reviewed: 09/08/2020 Elsevier Patient Education  2024 Elsevier Inc.  

## 2023-02-03 NOTE — Assessment & Plan Note (Addendum)
BP Readings from Last 3 Encounters:  02/03/23 136/78  01/05/23 122/70  12/30/22 115/67  Well-controlled hypertension Continue Zestoretic 20-25 mg daily Cardiovascular risks associated with hypertension discussed Dietary approaches to stop hypertension discussed

## 2023-02-10 ENCOUNTER — Ambulatory Visit
Admission: RE | Admit: 2023-02-10 | Discharge: 2023-02-10 | Disposition: A | Discharge status: Home / Self Care | Payer: Medicare Other | Source: Ambulatory Visit | Attending: Family Medicine | Admitting: Family Medicine

## 2023-02-10 DIAGNOSIS — M4316 Spondylolisthesis, lumbar region: Secondary | ICD-10-CM | POA: Diagnosis not present

## 2023-02-10 DIAGNOSIS — M51369 Other intervertebral disc degeneration, lumbar region without mention of lumbar back pain or lower extremity pain: Secondary | ICD-10-CM

## 2023-02-10 DIAGNOSIS — M5441 Lumbago with sciatica, right side: Secondary | ICD-10-CM

## 2023-02-10 DIAGNOSIS — M4726 Other spondylosis with radiculopathy, lumbar region: Secondary | ICD-10-CM | POA: Diagnosis not present

## 2023-02-16 NOTE — Progress Notes (Unsigned)
Cardiology Office Note:    Date:  02/20/2023   ID:  [name] [name], DOB 10-14-1950, MRN [id number]  PCP:  Georgina Quint, MD  Cardiologist:  Little Ishikawa, MD  Electrophysiologist:  None   Referring MD: Georgina Quint, *   Chief Complaint  Patient presents with   Edema    Pt says in ankles and feet. She says it seem to go away when she restarted lisinopril.     History of Present Illness:    [name] [name] is a 72 y.o. female with a hx of hypertension, hyperlipidemia, OSA, TIA, bacterial meningitis, seizures who presents for follow-up.  She was referred by Dr. Alvy Bimler for evaluation of chest pain and dyspnea on exertion, initially seen on 12/21/2021.  She reports that she has had chest pressure recently.  Describes as pressure in central chest, occurring every day.  Can occur with exertion, such as when walking up stairs, but not always.  Also can occur when she is just sitting in a chair resting or when she is coloring.  Can last for hours.  Most active thing she does is grocery shopping, has not reported chest pain with this.  Does report she gets short of breath.  She denies any lightheadedness, syncope, lower extremity edema.  She reports palpitations, occurring once per month and lasting for few seconds.  She smoked for 40 years, about 0.5 packs/day, quit age 42.  Family history includes father died of MI at 16 and mother died of MI at 71.  Echocardiogram 01/01/2022 showed normal biventricular function, no significant valvular disease.  Coronary CTA on 01/13/2022 showed calcium score 572 (95th percentile), moderate disease in RCA and LCx, no significant CAD by CT FFR.  She was noted at clinic visit 04/2022 to be in junctional rhythm with rate in 40s.  She was on carvedilol which was discontinued.  Zio patch x 8 days on 04/29/2022 showed episodes of junctional rhythm, but normal rate.  Since last clinic visit, she reports has been doing okay.  Denies any  chest pain or dyspnea.  Does report palpitations, can occur at rest or notices them when going up stairs and sometimes after eating.  Reports some lightheadedness if looking down for a long time, denies any syncope.  Reports compliance with her CPAP.   Past Medical History:  Diagnosis Date   Anxiety    Cataract    Chronic insomnia 10/13/2016   Depression    Gait disorder 03/14/2014   HTN (hypertension)    Hx of cardiovascular stress test    Lexiscan Myoview (1/16):  No ischemia, EF 68%, breast atten; Low Risk   Hyperlipemia    Hyperlipidemia    Phreesia 07/18/2020   Hypertension    Phreesia 07/18/2020   Memory difficulty 03/14/2014   Meningitis due to bacteria 01-18-14   OSA (obstructive sleep apnea) 11/30/2016   Seizures (HCC)    Sleep apnea    Phreesia 07/18/2020   TIA (transient ischemic attack)     Past Surgical History:  Procedure Laterality Date   NO PAST SURGERIES      Current Medications: Current Meds  Medication Sig   aspirin 81 MG chewable tablet Chew 81 mg by mouth daily.   carbamazepine (TEGRETOL) 200 MG tablet TAKE 1 AND 1/2 TABLETS BY MOUTH TWO TIMES A DAY   cholecalciferol (VITAMIN D3) 25 MCG (1000 UT) tablet Take 1,000 Units by mouth daily.   escitalopram (LEXAPRO) 10 MG tablet TAKE 1 TABLET(10 MG) BY MOUTH  AT BEDTIME   ezetimibe (ZETIA) 10 MG tablet Take 10 mg by mouth daily.   levETIRAcetam (KEPPRA) 750 MG tablet TAKE 2 TABLETS(1500 MG) BY MOUTH TWICE DAILY   lisinopril-hydrochlorothiazide (ZESTORETIC) 20-25 MG tablet TAKE 1 TABLET BY MOUTH DAILY   Multiple Vitamins-Minerals (MULTIVITAMIN PO) Take 1 tablet by mouth daily.   nitroGLYCERIN (NITROSTAT) 0.4 MG SL tablet Place 1 tablet (0.4 mg total) under the tongue every 5 (five) minutes as needed for chest pain.   rosuvastatin (CRESTOR) 40 MG tablet TAKE 1 TABLET(40 MG) BY MOUTH DAILY     Allergies:   Zetia [ezetimibe]   Social History   Socioeconomic History   Marital status: Single    Spouse name:  Not on file   Number of children: 1   Years of education: college   Highest education level: Some college, no degree  Occupational History   Occupation: Retired Financial planner: [employer]  Tobacco Use   Smoking status: Former    Current packs/day: 0.00    Average packs/day: 0.5 packs/day for 40.0 years (20.0 ttl pk-yrs)    Types: Cigarettes    Start date: 07/09/1971    Quit date: 07/09/2011    Years since quitting: 11.6   Smokeless tobacco: Never  Vaping Use   Vaping status: Never Used  Substance and Sexual Activity   Alcohol use: Not Currently    Comment: Rare   Drug use: No   Sexual activity: Never    Birth control/protection: Abstinence  Other Topics Concern   Not on file  Social History Narrative   Marital status: divorced x 35 years; not dating; not interested in 2019.      Children: 1 child/son (44); 2 grandchildren; [place]      Lives:  Alone in [place] in [place].      Employment: retired 2008; Administrator, arts.      Tobacco: quit smoking in 2013.  Smoked x many years       Alcohol: none; holidays only      Exercise: none; quit exercising two years ago in 2015.        Seatbelt: 100%; no texting. Does not drive in [address] due to seizure activity.       ADLs: no driving in 4098[place]1191 due to seizure activity; independent with ADLs.  Has cane for long distances      Advanced Directives: none; [name]/sister.  FULL CODE no prolonged measures.         Patient is single lives at home alone, has 1 child   Patient is right handed   Education level is 1 year of college   Caffeine consumption is 2 cups daily   Social Determinants of Health   Financial Resource Strain: Low Risk  (02/03/2023)   Overall Financial Resource Strain (CARDIA)    Difficulty of Paying Living Expenses: Not hard at all  Food Insecurity: No Food Insecurity (02/03/2023)   Hunger Vital Sign    Worried About Running Out of Food in the Last Year: Never true    Ran Out of Food in the Last  Year: Never true  Transportation Needs: No Transportation Needs (02/03/2023)   PRAPARE - Administrator, Civil Service (Medical): No    Lack of Transportation (Non-Medical): No  Physical Activity: Inactive (02/03/2023)   Exercise Vital Sign    Days of Exercise per Week: 0 days    Minutes of Exercise per Session: 0 min  Stress: Stress Concern Present (02/03/2023)  [employer] of Occupational Health - Occupational Stress Questionnaire    Feeling of Stress : To some extent  Social Connections: Moderately Integrated (02/03/2023)   Social Connection and Isolation Panel [NHANES]    Frequency of Communication with Friends and Family: More than three times a week    Frequency of Social Gatherings with Friends and Family: Twice a week    Attends Religious Services: More than 4 times per year    Active Member of [employer] or Organizations: Yes    Attends Engineer, structural: More than 4 times per year    Marital Status: Divorced     Family History: The patient's family history includes Arthritis in her sister; Cerebral aneurysm in her sister; Heart attack (age of onset: 17) in her father; Heart attack (age of onset: 39) in her mother; Hyperlipidemia in her brother, brother, and brother; Hypertension in her brother, brother, brother, mother, and sister; Kidney disease in her sister; Rheum arthritis in her sister; Stroke in her mother.  ROS:   Please see the history of present illness.     All other systems reviewed and are negative.  EKGs/Labs/Other Studies Reviewed:    The following studies were reviewed today:   EKG:   02/20/2023: Sinus bradycardia with junctional beats, rate 44 05/24/22: sinus bradycardia, rate 54, no ST abnormality 04/12/22: Junctional rhythm heart rate 45, no ST abnormality 12/21/2021: Sinus bradycardia, rate 54, first-degree AV block  Recent Labs: 01/05/2023: TSH 2.15 02/03/2023: ALT 10; BUN 31; Creatinine, Ser 1.19; Hemoglobin 10.4; Platelets  218.0; Potassium 3.8; Sodium 139  Recent Lipid Panel    Component Value Date/Time   CHOL 163 02/03/2023 1053   CHOL 174 08/17/2022 1027   TRIG 88.0 02/03/2023 1053   HDL 74.10 02/03/2023 1053   HDL 85 08/17/2022 1027   CHOLHDL 2 02/03/2023 1053   VLDL 17.6 02/03/2023 1053   LDLCALC 71 02/03/2023 1053   LDLCALC 76 08/17/2022 1027    Physical Exam:    VS:  BP 114/64 (BP Location: Left Arm, Patient Position: Sitting, Cuff Size: Normal)   Pulse (!) 48   Ht 5\' 5"  (1.651 m)   Wt 241 lb 9.6 oz (109.6 kg)   SpO2 97%   BMI 40.20 kg/m     Wt Readings from Last 3 Encounters:  02/18/23 241 lb 9.6 oz (109.6 kg)  02/03/23 235 lb 6 oz (106.8 kg)  01/05/23 244 lb (110.7 kg)     GEN: Well nourished, well developed in no acute distress HEENT: Normal NECK: No JVD; No carotid bruits LYMPHATICS: No lymphadenopathy CARDIAC: RRR, no murmurs, rubs, gallops RESPIRATORY:  Clear to auscultation without rales, wheezing or rhonchi  ABDOMEN: Soft, non-tender, non-distended MUSCULOSKELETAL:  No edema; No deformity  SKIN: Warm and dry NEUROLOGIC:  Alert and oriented x 3 PSYCHIATRIC:  Normal affect   ASSESSMENT:    1. Bradycardia   2. Palpitations   3. Coronary artery disease involving native coronary artery of native heart without angina pectoris   4. Primary hypertension   5. Hyperlipidemia LDL goal <70       PLAN:    CAD: reported chest pain with possible angina, as describes central chest pressure that can occur with exertion, though not reliably brought on by exertion and can also occur at rest.  She does have significant CAD risk factors (age, former tobacco use, hypertension, hyperlipidemia, family history).  Echocardiogram 01/01/2022 showed normal biventricular function, no significant valvular disease.  Coronary CTA on 01/13/2022 showed calcium score 572 (  95th percentile), moderate disease in RCA and LCx, no significant CAD by CT FFR. -Continue ASA -Continue rosuvastatin and  Zetia  Bradycardia/palpitations: She was noted at clinic visit 04/2022 to be in junctional rhythm with rate in 40s.  She was on carvedilol which was discontinued.  Reports her fatigue significantly improved since stopping carvedilol.  Zio patch x 8 days on 04/29/2022 showed episodes of junctional rhythm, but normal rate. -She is reporting recent palpitations, will reevaluate with Zio patch x 7 days  Hypertension: On lisinopril-HCTZ 20-25 mg daily.  Appears controlled.  Hyperlipidemia: On rosuvastatin 40 mg daily, LDL 95 on 04/12/2022.  Zetia 10 mg daily added.  Reported worsening palpitations when she started Zetia so she stopped taking.  Not clear that this was related to Zetia use.  Rechallenged with zetia and reports tolerating well.  LDL 71 on 02/03/2023  T2DM: A1c 6.4% on 02/03/23.  Stopped taking metformin  OSA: on CPAP, reports compliance  RTC in 4 months   Medication Adjustments/Labs and Tests Ordered: Current medicines are reviewed at length with the patient today.  Concerns regarding medicines are outlined above.  Orders Placed This Encounter  Procedures   LONG TERM MONITOR (3-14 DAYS)   EKG 12-Lead   No orders of the defined types were placed in this encounter.   Patient Instructions  Medication Instructions:  Your physician recommends that you continue on your current medications as directed. Please refer to the Current Medication list given to you today.  *If you need a refill on your cardiac medications before your next appointment, please call your pharmacy*  Lab Work: If you have labs (blood work) drawn today and your tests are completely normal, you will receive your results only by: MyChart Message (if you have MyChart) OR A paper copy in the mail If you have any lab test that is abnormal or we need to change your treatment, we will call you to review the results.  Testing/Procedures: Your physician has recommended that you wear a 7 day monitor. Monitors are  medical devices that record the heart's electrical activity. Doctors most often [place] these monitors to diagnose arrhythmias. Arrhythmias are problems with the speed or rhythm of the heartbeat. The monitor is a small, portable device. You can wear one while you do your normal daily activities. This is usually used to diagnose what is causing palpitations/syncope (passing out).  Follow-Up: At [facility], you and your health needs are our priority.  As part of our continuing mission to provide you with exceptional heart care, we have created designated Provider Care Teams.  These Care Teams include your primary Cardiologist (physician) and Advanced Practice Providers (APPs -  Physician Assistants and Nurse Practitioners) who all work together to provide you with the care you need, when you need it.  We recommend signing up for the patient portal called "MyChart".  Sign up information is provided on this After Visit Summary.  MyChart is used to connect with patients for Virtual Visits (Telemedicine).  Patients are able to view lab/test results, encounter notes, upcoming appointments, etc.  Non-urgent messages can be sent to your provider as well.   To learn more about what you can do with MyChart, go to [email or web address].    Your next appointment:   4 month(s)  Provider:   Little Ishikawa, MD        Signed, Little Ishikawa, MD  02/20/2023 9:53 PM    [facility]

## 2023-02-18 ENCOUNTER — Ambulatory Visit: Payer: Medicare Other | Attending: Cardiology

## 2023-02-18 ENCOUNTER — Encounter: Payer: Self-pay | Admitting: Cardiology

## 2023-02-18 ENCOUNTER — Ambulatory Visit: Payer: Medicare Other | Attending: Cardiology | Admitting: Cardiology

## 2023-02-18 VITALS — BP 114/64 | HR 48 | Ht 65.0 in | Wt 241.6 lb

## 2023-02-18 DIAGNOSIS — R002 Palpitations: Secondary | ICD-10-CM

## 2023-02-18 DIAGNOSIS — I10 Essential (primary) hypertension: Secondary | ICD-10-CM | POA: Diagnosis not present

## 2023-02-18 DIAGNOSIS — E785 Hyperlipidemia, unspecified: Secondary | ICD-10-CM | POA: Diagnosis not present

## 2023-02-18 DIAGNOSIS — R001 Bradycardia, unspecified: Secondary | ICD-10-CM | POA: Diagnosis not present

## 2023-02-18 DIAGNOSIS — I2510 Atherosclerotic heart disease of native coronary artery without angina pectoris: Secondary | ICD-10-CM

## 2023-02-18 NOTE — Progress Notes (Unsigned)
Enrolled for [employer] to mail a ZIO XT long term holter monitor to the patients address on file.  

## 2023-02-18 NOTE — Patient Instructions (Addendum)
Medication Instructions:  Your physician recommends that you continue on your current medications as directed. Please refer to the Current Medication list given to you today.  *If you need a refill on your cardiac medications before your next appointment, please call your pharmacy*  Lab Work: If you have labs (blood work) drawn today and your tests are completely normal, you will receive your results only by: MyChart Message (if you have MyChart) OR A paper copy in the mail If you have any lab test that is abnormal or we need to change your treatment, we will call you to review the results.  Testing/Procedures: Your physician has recommended that you wear a 7 day monitor. Monitors are medical devices that record the heart's electrical activity. Doctors most often [place] these monitors to diagnose arrhythmias. Arrhythmias are problems with the speed or rhythm of the heartbeat. The monitor is a small, portable device. You can wear one while you do your normal daily activities. This is usually used to diagnose what is causing palpitations/syncope (passing out).  Follow-Up: At [facility], you and your health needs are our priority.  As part of our continuing mission to provide you with exceptional heart care, we have created designated Provider Care Teams.  These Care Teams include your primary Cardiologist (physician) and Advanced Practice Providers (APPs -  Physician Assistants and Nurse Practitioners) who all work together to provide you with the care you need, when you need it.  We recommend signing up for the patient portal called "MyChart".  Sign up information is provided on this After Visit Summary.  MyChart is used to connect with patients for Virtual Visits (Telemedicine).  Patients are able to view lab/test results, encounter notes, upcoming appointments, etc.  Non-urgent messages can be sent to your provider as well.   To learn more about what you can do with MyChart, go to  [email or web address].    Your next appointment:   4 month(s)  Provider:   Little Ishikawa, MD

## 2023-02-21 ENCOUNTER — Other Ambulatory Visit: Payer: Self-pay | Admitting: Emergency Medicine

## 2023-02-22 DIAGNOSIS — R002 Palpitations: Secondary | ICD-10-CM

## 2023-02-22 DIAGNOSIS — R001 Bradycardia, unspecified: Secondary | ICD-10-CM

## 2023-03-02 NOTE — Progress Notes (Signed)
Please call with findings and recommendations.

## 2023-03-08 DIAGNOSIS — R001 Bradycardia, unspecified: Secondary | ICD-10-CM | POA: Diagnosis not present

## 2023-03-08 DIAGNOSIS — R002 Palpitations: Secondary | ICD-10-CM | POA: Diagnosis not present

## 2023-03-31 ENCOUNTER — Other Ambulatory Visit: Payer: Self-pay | Admitting: Cardiology

## 2023-04-05 NOTE — Telephone Encounter (Signed)
Hello, are we managing Zetia. Pt requesting refill. Please review. Thank you

## 2023-05-24 ENCOUNTER — Other Ambulatory Visit: Payer: Self-pay

## 2023-05-24 ENCOUNTER — Telehealth: Payer: Self-pay | Admitting: Neurology

## 2023-05-24 DIAGNOSIS — G4733 Obstructive sleep apnea (adult) (pediatric): Secondary | ICD-10-CM

## 2023-05-24 NOTE — Telephone Encounter (Signed)
Orders faxed to [facility]

## 2023-05-24 NOTE — Telephone Encounter (Signed)
What orders are needed for CPAP? Back in June 2024 I ordered HST to try for new CPAP machine, hers has been in use since 2018. Currently at set pressure 13, EPR off. Ramp time 20 min, start pressure 4 cm. Thanks

## 2023-06-26 NOTE — Progress Notes (Unsigned)
 Cardiology Office Note:    Date:  06/28/2023   ID:  [name] [name], DOB 1950/05/10, MRN [id number]  PCP:  [name] Quint, MD  Cardiologist:  Little Ishikawa, MD  Electrophysiologist:  None   Referring MD: [name] [name], *   Chief Complaint  Patient presents with   Bradycardia    History of Present Illness:    [name] [name] is a 73 y.o. female with a hx of hypertension, hyperlipidemia, OSA, TIA, bacterial meningitis, seizures who presents for follow-up.  She was referred by Dr. Alvy Bimler for evaluation of chest pain and dyspnea on exertion, initially seen on 12/21/2021.    Echocardiogram 01/01/2022 showed normal biventricular function, no significant valvular disease.  Coronary CTA on 01/13/2022 showed calcium score 572 (95th percentile), moderate disease in RCA and LCx, no significant CAD by CT FFR.  She was noted at clinic visit 04/2022 to be in junctional rhythm with rate in 40s.  She was on carvedilol which was discontinued.  Zio patch x 8 days on 04/29/2022 showed episodes of junctional rhythm, but normal rate.  Zio patch x 7 days 02/2023 showed occasional PVCs (1.7%), junctional rhythm present but no symptoms reported.  Since last clinic visit, she reports she has been feeling very fatigued.  She denies any lightheadedness or syncope.  Denies any chest pain or dyspnea.  Does report some palpitations.  Reports compliance with CPAP.   Past Medical History:  Diagnosis Date   Anxiety    Cataract    Chronic insomnia 10/13/2016   Depression    Gait disorder 03/14/2014   HTN (hypertension)    Hx of cardiovascular stress test    Lexiscan Myoview (1/16):  No ischemia, EF 68%, breast atten; Low Risk   Hyperlipemia    Hyperlipidemia    Phreesia 07/18/2020   Hypertension    Phreesia 07/18/2020   Memory difficulty 03/14/2014   Meningitis due to bacteria 01-18-14   OSA (obstructive sleep apnea) 11/30/2016   Seizures (HCC)    Sleep apnea    Phreesia  07/18/2020   TIA (transient ischemic attack)     Past Surgical History:  Procedure Laterality Date   NO PAST SURGERIES      Current Medications: No outpatient medications have been marked as taking for the 06/28/23 encounter (Office Visit) with Little Ishikawa, MD.     Allergies:   Zetia [ezetimibe]   Social History   Socioeconomic History   Marital status: Single    Spouse name: Not on file   Number of children: 1   Years of education: college   Highest education level: Some college, no degree  Occupational History   Occupation: Retired Financial planner: [employer]  Tobacco Use   Smoking status: Former    Current packs/day: 0.00    Average packs/day: 0.5 packs/day for 40.0 years (20.0 ttl pk-yrs)    Types: Cigarettes    Start date: 07/09/1971    Quit date: 07/09/2011    Years since quitting: 11.9   Smokeless tobacco: Never  Vaping Use   Vaping status: Never Used  Substance and Sexual Activity   Alcohol use: Not Currently    Comment: Rare   Drug use: No   Sexual activity: Never    Birth control/protection: Abstinence  Other Topics Concern   Not on file  Social History Narrative   Marital status: divorced x 35 years; not dating; not interested in 2019.      Children: 1 child/son (44); 2 grandchildren;  [place]      Lives:  Alone in [place] in [place].      Employment: retired 2008; Administrator, arts.      Tobacco: quit smoking in 2013.  Smoked x many years       Alcohol: none; holidays only      Exercise: none; quit exercising two years ago in 2015.        Seatbelt: 100%; no texting. Does not drive in 1610 due to seizure activity.       ADLs: no driving in 9604-5409 due to seizure activity; independent with ADLs.  Has cane for long distances      Advanced Directives: none; [name]/sister.  FULL CODE no prolonged measures.         Patient is single lives at home alone, has 1 child   Patient is right handed   Education level is 1 year of  college   Caffeine consumption is 2 cups daily   Social Drivers of Health   Financial Resource Strain: Low Risk  (02/03/2023)   Overall Financial Resource Strain (CARDIA)    Difficulty of Paying Living Expenses: Not hard at all  Food Insecurity: No Food Insecurity (02/03/2023)   Hunger Vital Sign    Worried About Running Out of Food in the Last Year: Never true    Ran Out of Food in the Last Year: Never true  Transportation Needs: No Transportation Needs (02/03/2023)   PRAPARE - Administrator, Civil Service (Medical): No    Lack of Transportation (Non-Medical): No  Physical Activity: Inactive (02/03/2023)   Exercise Vital Sign    Days of Exercise per Week: 0 days    Minutes of Exercise per Session: 0 min  Stress: Stress Concern Present (02/03/2023)   [employer] of Occupational Health - Occupational Stress Questionnaire    Feeling of Stress : To some extent  Social Connections: Moderately Integrated (02/03/2023)   Social Connection and Isolation Panel [NHANES]    Frequency of Communication with Friends and Family: More than three times a week    Frequency of Social Gatherings with Friends and Family: Twice a week    Attends Religious Services: More than 4 times per year    Active Member of [employer] or Organizations: Yes    Attends Engineer, structural: More than 4 times per year    Marital Status: Divorced     Family History: The patient's family history includes Arthritis in her sister; Cerebral aneurysm in her sister; Heart attack (age of onset: 48) in her father; Heart attack (age of onset: 93) in her mother; Hyperlipidemia in her brother, brother, and brother; Hypertension in her brother, brother, brother, mother, and sister; Kidney disease in her sister; Rheum arthritis in her sister; Stroke in her mother.  ROS:   Please see the history of present illness.     All other systems reviewed and are negative.  EKGs/Labs/Other Studies Reviewed:    The  following studies were reviewed today:   EKG:   02/20/2023: Sinus bradycardia with junctional beats, rate 44 05/24/22: sinus bradycardia, rate 54, no ST abnormality 04/12/22: Junctional rhythm heart rate 45, no ST abnormality 12/21/2021: Sinus bradycardia, rate 54, first-degree AV block 06/28/23: Sinus bradycardia, rate 39  Recent Labs: 01/05/2023: TSH 2.15 02/03/2023: ALT 10; BUN 31; Creatinine, Ser 1.19; Hemoglobin 10.4; Platelets 218.0; Potassium 3.8; Sodium 139  Recent Lipid Panel    Component Value Date/Time   CHOL 163 02/03/2023 1053   CHOL 174  08/17/2022 1027   TRIG 88.0 02/03/2023 1053   HDL 74.10 02/03/2023 1053   HDL 85 08/17/2022 1027   CHOLHDL 2 02/03/2023 1053   VLDL 17.6 02/03/2023 1053   LDLCALC 71 02/03/2023 1053   LDLCALC 76 08/17/2022 1027    Physical Exam:    VS:  BP (!) 109/53   Pulse (!) 54   Ht 5' 5.5" (1.664 m)   Wt 254 lb 12.8 oz (115.6 kg)   SpO2 98%   BMI 41.76 kg/m     Wt Readings from Last 3 Encounters:  06/28/23 254 lb 12.8 oz (115.6 kg)  02/18/23 241 lb 9.6 oz (109.6 kg)  02/03/23 235 lb 6 oz (106.8 kg)     GEN: Well nourished, well developed in no acute distress HEENT: Normal NECK: No JVD; No carotid bruits LYMPHATICS: No lymphadenopathy CARDIAC: RRR, no murmurs, rubs, gallops RESPIRATORY:  Clear to auscultation without rales, wheezing or rhonchi  ABDOMEN: Soft, non-tender, non-distended MUSCULOSKELETAL:  No edema; No deformity  SKIN: Warm and dry NEUROLOGIC:  Alert and oriented x 3 PSYCHIATRIC:  Normal affect   ASSESSMENT:    1. Bradycardia   2. SOB (shortness of breath)   3. Coronary artery disease involving native coronary artery of native heart without angina pectoris   4. Essential hypertension   5. Hyperlipidemia, unspecified hyperlipidemia type     PLAN:    Bradycardia: She was noted at clinic visit 04/2022 to be in junctional rhythm with rate in 40s.  She was on carvedilol which was discontinued.  Zio patch x 8 days on  04/29/2022 showed episodes of junctional rhythm, but normal rate.   -She is in sinus bradycardia with rate 39 in clinic today.  She denies lightheadedness or syncope but reports has been feeling very fatigued, suspect due to bradycardia.  Recommend referral to EP for PPM evaluation -Update echocardiogram  CAD: reported chest pain with possible angina, as describes central chest pressure that can occur with exertion, though not reliably brought on by exertion and can also occur at rest.  She does have significant CAD risk factors (age, former tobacco use, hypertension, hyperlipidemia, family history).  Echocardiogram 01/01/2022 showed normal biventricular function, no significant valvular disease.  Coronary CTA on 01/13/2022 showed calcium score 572 (95th percentile), moderate disease in RCA and LCx, not significant CAD by CT FFR. -Continue ASA -Continue rosuvastatin and Zetia  Hypertension: On lisinopril-HCTZ 20-25 mg daily.  Appears controlled.  Hyperlipidemia: On rosuvastatin 40 mg daily, LDL 95 on 04/12/2022.  Zetia 10 mg daily added.  Reported worsening palpitations when she started Zetia so she stopped taking.  Not clear that this was related to Zetia use.  Rechallenged with zetia and reports tolerating well.  LDL 71 on 02/03/2023  T2DM: A1c 6.4% on 02/03/23.  Stopped taking metformin  OSA: on CPAP, reports compliance  RTC in 4 months   Medication Adjustments/Labs and Tests Ordered: Current medicines are reviewed at length with the patient today.  Concerns regarding medicines are outlined above.  Orders Placed This Encounter  Procedures   Ambulatory referral to Cardiac Electrophysiology   EKG 12-Lead   ECHOCARDIOGRAM COMPLETE   No orders of the defined types were placed in this encounter.   Patient Instructions  Medication Instructions:  Continue current medicaions *If you need a refill on your cardiac medications before your next appointment, please call your pharmacy*   Lab  Work: none If you have labs (blood work) drawn today and your tests are completely normal, you will  receive your results only by: MyChart Message (if you have MyChart) OR A paper copy in the mail If you have any lab test that is abnormal or we need to change your treatment, we will call you to review the results.   Testing/Procedures: Echo  Your physician has requested that you have an echocardiogram. Echocardiography is a painless test that uses sound waves to create images of your heart. It provides your doctor with information about the size and shape of your heart and how well your heart's chambers and valves are working. This procedure takes approximately one hour. There are no restrictions for this procedure. Please do NOT wear cologne, perfume, aftershave, or lotions (deodorant is allowed). Please arrive 15 minutes prior to your appointment time.  Please note: We ask at that you not bring children with you during ultrasound (echo/ vascular) testing. Due to room size and safety concerns, children are not allowed in the ultrasound rooms during exams. Our front office staff cannot provide observation of children in our lobby area while testing is being conducted. An adult accompanying a patient to their appointment will only be allowed in the ultrasound room at the discretion of the ultrasound technician under special circumstances. We apologize for any inconvenience.     Follow-Up: At [facility], you and your health needs are our priority.  As part of our continuing mission to provide you with exceptional heart care, we have created designated Provider Care Teams.  These Care Teams include your primary Cardiologist (physician) and Advanced Practice Providers (APPs -  Physician Assistants and Nurse Practitioners) who all work together to provide you with the care you need, when you need it.  We recommend signing up for the patient portal called "MyChart".  Sign up information is  provided on this After Visit Summary.  MyChart is used to connect with patients for Virtual Visits (Telemedicine).  Patients are able to view lab/test results, encounter notes, upcoming appointments, etc.  Non-urgent messages can be sent to your provider as well.   To learn more about what you can do with MyChart, go to [email or web address].    Your next appointment:   4 month(s)  Provider:   Little Ishikawa, MD     Other Instructions Referral to Electrophysiology       Signed, Little Ishikawa, MD  06/28/2023 5:43 PM     [facility]

## 2023-06-28 ENCOUNTER — Ambulatory Visit: Payer: Medicare Other | Admitting: Cardiology

## 2023-06-28 ENCOUNTER — Encounter: Payer: Self-pay | Admitting: Cardiology

## 2023-06-28 ENCOUNTER — Ambulatory Visit: Payer: Medicare Other | Attending: Cardiology | Admitting: Cardiology

## 2023-06-28 VITALS — BP 109/53 | HR 54 | Ht 65.5 in | Wt 254.8 lb

## 2023-06-28 DIAGNOSIS — E785 Hyperlipidemia, unspecified: Secondary | ICD-10-CM | POA: Diagnosis not present

## 2023-06-28 DIAGNOSIS — R0602 Shortness of breath: Secondary | ICD-10-CM

## 2023-06-28 DIAGNOSIS — I10 Essential (primary) hypertension: Secondary | ICD-10-CM | POA: Diagnosis not present

## 2023-06-28 DIAGNOSIS — I2510 Atherosclerotic heart disease of native coronary artery without angina pectoris: Secondary | ICD-10-CM

## 2023-06-28 DIAGNOSIS — R001 Bradycardia, unspecified: Secondary | ICD-10-CM

## 2023-06-28 NOTE — Patient Instructions (Addendum)
 Medication Instructions:  Continue current medicaions *If you need a refill on your cardiac medications before your next appointment, please call your pharmacy*   Lab Work: none If you have labs (blood work) drawn today and your tests are completely normal, you will receive your results only by: MyChart Message (if you have MyChart) OR A paper copy in the mail If you have any lab test that is abnormal or we need to change your treatment, we will call you to review the results.   Testing/Procedures: Echo  Your physician has requested that you have an echocardiogram. Echocardiography is a painless test that uses sound waves to create images of your heart. It provides your doctor with information about the size and shape of your heart and how well your heart's chambers and valves are working. This procedure takes approximately one hour. There are no restrictions for this procedure. Please do NOT wear cologne, perfume, aftershave, or lotions (deodorant is allowed). Please arrive 15 minutes prior to your appointment time.  Please note: We ask at that you not bring children with you during ultrasound (echo/ vascular) testing. Due to room size and safety concerns, children are not allowed in the ultrasound rooms during exams. Our front office staff cannot provide observation of children in our lobby area while testing is being conducted. An adult accompanying a patient to their appointment will only be allowed in the ultrasound room at the discretion of the ultrasound technician under special circumstances. We apologize for any inconvenience.     Follow-Up: At [facility], you and your health needs are our priority.  As part of our continuing mission to provide you with exceptional heart care, we have created designated Provider Care Teams.  These Care Teams include your primary Cardiologist (physician) and Advanced Practice Providers (APPs -  Physician Assistants and Nurse Practitioners)  who all work together to provide you with the care you need, when you need it.  We recommend signing up for the patient portal called "MyChart".  Sign up information is provided on this After Visit Summary.  MyChart is used to connect with patients for Virtual Visits (Telemedicine).  Patients are able to view lab/test results, encounter notes, upcoming appointments, etc.  Non-urgent messages can be sent to your provider as well.   To learn more about what you can do with MyChart, go to [email or web address].    Your next appointment:   4 month(s)  Provider:   Little Ishikawa, MD     Other Instructions Referral to Electrophysiology

## 2023-07-26 ENCOUNTER — Ambulatory Visit (HOSPITAL_COMMUNITY): Payer: Medicare Other | Attending: Cardiology

## 2023-07-26 DIAGNOSIS — I2510 Atherosclerotic heart disease of native coronary artery without angina pectoris: Secondary | ICD-10-CM | POA: Diagnosis not present

## 2023-07-26 LAB — ECHOCARDIOGRAM COMPLETE
Area-P 1/2: 3.42 cm2
S' Lateral: 3.2 cm

## 2023-07-27 ENCOUNTER — Encounter: Payer: Self-pay | Admitting: *Deleted

## 2023-07-29 ENCOUNTER — Ambulatory Visit: Payer: Medicare Other | Attending: Internal Medicine | Admitting: Cardiovascular Disease

## 2023-07-29 ENCOUNTER — Encounter: Payer: Self-pay | Admitting: Cardiovascular Disease

## 2023-07-29 VITALS — BP 102/70 | HR 54 | Ht 65.5 in | Wt 252.8 lb

## 2023-07-29 DIAGNOSIS — R002 Palpitations: Secondary | ICD-10-CM

## 2023-07-29 DIAGNOSIS — R001 Bradycardia, unspecified: Secondary | ICD-10-CM | POA: Diagnosis not present

## 2023-07-29 NOTE — Progress Notes (Signed)
 Electrophysiology Office Note:    Date:  07/29/2023   ID:  [name] [name], DOB 02/24/1951, MRN [id number]  PCP:  [name] Quint, MD   [facility] Providers Cardiologist:  [name] Ishikawa, MD     Referring MD: [name] [name]*   History of Present Illness:    [name] [name] is a 73 y.o. female with a medical history significant for hypertension, hyperlipidemia, OSA, TIA, bacterial meningitis, seizures, referred for rhythm management.     She saw her primary cardiologist, Dr. Bjorn [name], in December 2023 with complaints of fatigue, shortness of breath.  She was noted to be in a junctional rhythm with rate in the 40s.  Carvedilol was discontinued.  A follow-up ZIO monitor that month showed episodes of junctional rhythm with a minimum rate of 37 bpm and an average rate of 54 bpm.  She was again seen in February 2025 with complaints of fatigue and sinus bradycardia with a heart rate of 39 bpm despite having discontinued carvedilol.    She is here with her sister today.  The patient is significantly limited by sciatic pain.  She also sleeps poorly though she does attempt to use her CPAP as best as possible.  Due to insomnia, she has daytime sleepiness.  She concedes that her fatigue is multifactorial, and bradycardia may be playing 1 part.      Today, she is at baseline with moderate fatigue and using a cane to ambulate due to sciatic pain.  She has not had syncope, presyncope.  EKGs/Labs/Other Studies Reviewed Today:     Echocardiogram:  TTE March 2025 EF 65 to 70%.  Mildly reduced right ventricular systolic dysfunction.  Otherwise normal structure and function   Monitors:  7 day monitor October 2024-- my interpretation Sinus rhythm predominates.  Heart rate 37 to 108 bpm, average 54 bpm There were episodes of competing junctional rhythm with a rate of approximately 40 beats minute 1.7% atrial ectopy No symptoms were reported    EKG:    EKG Interpretation Date/Time:  Friday July 29 2023 14:58:23 EDT Ventricular Rate:  54 PR Interval:  218 QRS Duration:  78 QT Interval:  452 QTC Calculation: 428 R Axis:   54  Text Interpretation: Sinus bradycardia with 1st degree A-V block When compared with ECG of 28-Jun-2023 16:05, No significant change was found Confirmed by York Pellant [phone]) on 07/29/2023 3:10:55 PM     Physical Exam:    VS:  BP 102/70 (BP Location: Left Arm, Patient Position: Sitting, Cuff Size: Large)   Pulse (!) 54   Ht 5' 5.5" (1.664 m)   Wt 252 lb 12.8 oz (114.7 kg)   SpO2 96%   BMI 41.43 kg/m     Wt Readings from Last 3 Encounters:  07/29/23 252 lb 12.8 oz (114.7 kg)  06/28/23 254 lb 12.8 oz (115.6 kg)  02/18/23 241 lb 9.6 oz (109.6 kg)     GEN: Well nourished, well developed in no acute distress CARDIAC: brady RR, no murmurs, rubs, gallops RESPIRATORY:  Normal work of breathing MUSCULOSKELETAL: no edema    ASSESSMENT & PLAN:     Sinus bradycardia With fatigue and daytime somnolence I suspect this is multifactorial with insomnia and deconditioning contributing However, I think that her bradycardia is also contributing, and it appears to be worsening progressively --with a heart rate of 39 during recent clinic visit despite having discontinued carvedilol We discussed management options.  I told her that I did not think she needed  a pacemaker urgently, but that I expected her bradycardia and fatigue to progress without a pacemaker. Using a shared decision-making technique, we decided to proceed with pacemaker placement.  I discussed the indication for the procedure and the logistics, risks, potential benefit, and after care. I specifically explained that risks include but are not limited to infection, bleeding,damage to blood vessels, lung, and the heart -- but risk of prolonged hospitalization, need for surgery, or the event of stroke, heart attack, or death are low but not zero.     Obstructive sleep apnea Compliant with CPAP     Signed, Maurice Small, MD  07/29/2023 3:13 PM    [facility]

## 2023-07-29 NOTE — Patient Instructions (Signed)
 Medication Instructions:  Your physician recommends that you continue on your current medications as directed. Please refer to the Current Medication list given to you today. *If you need a refill on your cardiac medications before your next appointment, please call your pharmacy*  Lab Work: CBC and BMET - please have pre-procedure lab work completed the week of April 21 - this can be completed at [facility] - no appointment required and this does not have to be fasting If you have labs (blood work) drawn today and your tests are completely normal, you will receive your results only by: MyChart Message (if you have MyChart) OR A paper copy in the mail If you have any lab test that is abnormal or we need to change your treatment, we will call you to review the results.  Testing/Procedures: Pacemaker implant - scheduled for Tuesday, May 13  Your physician has recommended that you have a pacemaker inserted. A pacemaker is a small device that is placed under the skin of your chest or abdomen to help control abnormal heart rhythms. This device uses electrical pulses to prompt the heart to beat at a normal rate. Pacemakers are used to treat heart rhythms that are too slow. Wire (leads) are attached to the pacemaker that goes into the chambers of you heart. This is done in the hospital and usually requires and overnight stay. Please see the instruction sheet given to you today for more information.   Follow-Up: At [facility], you and your health needs are our priority.  As part of our continuing mission to provide you with exceptional heart care, our providers are all part of one team.  This team includes your primary Cardiologist (physician) and Advanced Practice Providers or APPs (Physician Assistants and Nurse Practitioners) who all work together to provide you with the care you need, when you need it.  Your next appointment:   We will schedule follow up after your pacemaker  implant  Provider:   York Pellant, MD   We recommend signing up for the patient portal called "MyChart".  Sign up information is provided on this After Visit Summary.  MyChart is used to connect with patients for Virtual Visits (Telemedicine).  Patients are able to view lab/test results, encounter notes, upcoming appointments, etc.  Non-urgent messages can be sent to your provider as well.   To learn more about what you can do with MyChart, go to [email or web address].         1st Floor: - Lobby - Registration  - Pharmacy  - Lab - Cafe  2nd Floor: - PV Lab - Diagnostic Testing (echo, CT, nuclear med)  3rd Floor: - Vacant  4th Floor: - TCTS (cardiothoracic surgery) - AFib Clinic - Structural Heart Clinic - Vascular Surgery  - Vascular Ultrasound  5th Floor: - HeartCare Cardiology (general and EP) - Clinical Pharmacy for coumadin, hypertension, lipid, weight-loss medications, and med management appointments    Valet parking services will be available as well.

## 2023-08-04 ENCOUNTER — Other Ambulatory Visit: Payer: Self-pay | Admitting: Emergency Medicine

## 2023-08-04 ENCOUNTER — Encounter: Payer: Self-pay | Admitting: Emergency Medicine

## 2023-08-04 ENCOUNTER — Ambulatory Visit (INDEPENDENT_AMBULATORY_CARE_PROVIDER_SITE_OTHER): Payer: Medicare Other | Admitting: Emergency Medicine

## 2023-08-04 VITALS — BP 110/84 | HR 48 | Temp 98.4°F | Ht 65.5 in | Wt 251.0 lb

## 2023-08-04 DIAGNOSIS — E785 Hyperlipidemia, unspecified: Secondary | ICD-10-CM

## 2023-08-04 DIAGNOSIS — F5104 Psychophysiologic insomnia: Secondary | ICD-10-CM

## 2023-08-04 DIAGNOSIS — G4733 Obstructive sleep apnea (adult) (pediatric): Secondary | ICD-10-CM | POA: Diagnosis not present

## 2023-08-04 DIAGNOSIS — M5431 Sciatica, right side: Secondary | ICD-10-CM | POA: Diagnosis not present

## 2023-08-04 DIAGNOSIS — M5432 Sciatica, left side: Secondary | ICD-10-CM | POA: Diagnosis not present

## 2023-08-04 DIAGNOSIS — R7303 Prediabetes: Secondary | ICD-10-CM | POA: Diagnosis not present

## 2023-08-04 DIAGNOSIS — R001 Bradycardia, unspecified: Secondary | ICD-10-CM | POA: Insufficient documentation

## 2023-08-04 DIAGNOSIS — G40909 Epilepsy, unspecified, not intractable, without status epilepticus: Secondary | ICD-10-CM

## 2023-08-04 DIAGNOSIS — N1831 Chronic kidney disease, stage 3a: Secondary | ICD-10-CM

## 2023-08-04 DIAGNOSIS — I10 Essential (primary) hypertension: Secondary | ICD-10-CM | POA: Diagnosis not present

## 2023-08-04 LAB — COMPREHENSIVE METABOLIC PANEL WITH GFR
ALT: 12 U/L (ref 0–35)
AST: 16 U/L (ref 0–37)
Albumin: 4.2 g/dL (ref 3.5–5.2)
Alkaline Phosphatase: 108 U/L (ref 39–117)
BUN: 28 mg/dL — ABNORMAL HIGH (ref 6–23)
CO2: 33 meq/L — ABNORMAL HIGH (ref 19–32)
Calcium: 9.5 mg/dL (ref 8.4–10.5)
Chloride: 103 meq/L (ref 96–112)
Creatinine, Ser: 1.15 mg/dL (ref 0.40–1.20)
GFR: 47.64 mL/min — ABNORMAL LOW (ref >60.00)
Glucose, Bld: 96 mg/dL (ref 70–99)
Potassium: 4.3 meq/L (ref 3.5–5.1)
Sodium: 142 meq/L (ref 135–145)
Total Bilirubin: 0.3 mg/dL (ref 0.2–1.2)
Total Protein: 6.7 g/dL (ref 6.0–8.3)

## 2023-08-04 LAB — CBC WITH DIFFERENTIAL/PLATELET
Basophils Absolute: 0 10*3/uL (ref 0.0–0.1)
Basophils Relative: 0.4 % (ref 0.0–3.0)
Eosinophils Absolute: 0.1 10*3/uL (ref 0.0–0.7)
Eosinophils Relative: 2.7 % (ref 0.0–5.0)
HCT: 35 % — ABNORMAL LOW (ref 36.0–46.0)
Hemoglobin: 11.3 g/dL — ABNORMAL LOW (ref 12.0–15.0)
Lymphocytes Relative: 23.4 % (ref 12.0–46.0)
Lymphs Abs: 0.8 10*3/uL (ref 0.7–4.0)
MCHC: 32.2 g/dL (ref 30.0–36.0)
MCV: 81.5 fl (ref 78.0–100.0)
Monocytes Absolute: 0.5 10*3/uL (ref 0.1–1.0)
Monocytes Relative: 14.1 % — ABNORMAL HIGH (ref 3.0–12.0)
Neutro Abs: 2.1 10*3/uL (ref 1.4–7.7)
Neutrophils Relative %: 59.4 % (ref 43.0–77.0)
Platelets: 198 10*3/uL (ref 150.0–400.0)
RBC: 4.3 Mil/uL (ref 3.87–5.11)
RDW: 13.8 % (ref 11.5–15.5)
WBC: 3.5 10*3/uL — ABNORMAL LOW (ref 4.0–10.5)

## 2023-08-04 LAB — LIPID PANEL
Cholesterol: 179 mg/dL (ref 0–200)
HDL: 80.5 mg/dL (ref >39.00)
LDL Cholesterol: 82 mg/dL (ref 0–99)
NonHDL: 98.25
Total CHOL/HDL Ratio: 2
Triglycerides: 83 mg/dL (ref 0.0–149.0)
VLDL: 16.6 mg/dL (ref 0.0–40.0)

## 2023-08-04 LAB — HEMOGLOBIN A1C: Hgb A1c MFr Bld: 6.5 % (ref 4.6–6.5)

## 2023-08-04 LAB — TSH: TSH: 2.16 u[IU]/mL (ref 0.35–5.50)

## 2023-08-04 NOTE — Patient Instructions (Signed)
 Health Maintenance After Age 73 After age 4, you are at a higher risk for certain long-term diseases and infections as well as injuries from falls. Falls are a major cause of broken bones and head injuries in people who are older than age 47. Getting regular preventive care can help to keep you healthy and well. Preventive care includes getting regular testing and making lifestyle changes as recommended by your health care provider. Talk with your health care provider about: Which screenings and tests you should have. A screening is a test that checks for a disease when you have no symptoms. A diet and exercise plan that is right for you. What should I know about screenings and tests to prevent falls? Screening and testing are the best ways to find a health problem early. Early diagnosis and treatment give you the best chance of managing medical conditions that are common after age 37. Certain conditions and lifestyle choices may make you more likely to have a fall. Your health care provider may recommend: Regular vision checks. Poor vision and conditions such as cataracts can make you more likely to have a fall. If you wear glasses, make sure to get your prescription updated if your vision changes. Medicine review. Work with your health care provider to regularly review all of the medicines you are taking, including over-the-counter medicines. Ask your health care provider about any side effects that may make you more likely to have a fall. Tell your health care provider if any medicines that you take make you feel dizzy or sleepy. Strength and balance checks. Your health care provider may recommend certain tests to check your strength and balance while standing, walking, or changing positions. Foot health exam. Foot pain and numbness, as well as not wearing proper footwear, can make you more likely to have a fall. Screenings, including: Osteoporosis screening. Osteoporosis is a condition that causes  the bones to get weaker and break more easily. Blood pressure screening. Blood pressure changes and medicines to control blood pressure can make you feel dizzy. Depression screening. You may be more likely to have a fall if you have a fear of falling, feel depressed, or feel unable to do activities that you used to do. Alcohol use screening. Using too much alcohol can affect your balance and may make you more likely to have a fall. Follow these instructions at home: Lifestyle Do not drink alcohol if: Your health care provider tells you not to drink. If you drink alcohol: Limit how much you have to: 0-1 drink a day for women. 0-2 drinks a day for men. Know how much alcohol is in your drink. In the [place] one drink equals one 12 oz bottle of beer (355 mL), one 5 oz glass of wine (148 mL), or one 1 oz glass of hard liquor (44 mL). Do not use any products that contain nicotine or tobacco. These products include cigarettes, chewing tobacco, and vaping devices, such as e-cigarettes. If you need help quitting, ask your health care provider. Activity  Follow a regular exercise program to stay fit. This will help you maintain your balance. Ask your health care provider what types of exercise are appropriate for you. If you need a cane or walker, use it as recommended by your health care provider. Wear supportive shoes that have nonskid soles. Safety  Remove any tripping hazards, such as rugs, cords, and clutter. Install safety equipment such as grab bars in bathrooms and safety rails on stairs. Keep rooms and walkways  well-lit. General instructions Talk with your health care provider about your risks for falling. Tell your health care provider if: You fall. Be sure to tell your health care provider about all falls, even ones that seem minor. You feel dizzy, tiredness (fatigue), or off-balance. Take over-the-counter and prescription medicines only as told by your health care provider. These include  supplements. Eat a healthy diet and maintain a healthy weight. A healthy diet includes low-fat dairy products, low-fat (lean) meats, and fiber from whole grains, beans, and lots of fruits and vegetables. Stay current with your vaccines. Schedule regular health, dental, and eye exams. Summary Having a healthy lifestyle and getting preventive care can help to protect your health and wellness after age 11. Screening and testing are the best way to find a health problem early and help you avoid having a fall. Early diagnosis and treatment give you the best chance for managing medical conditions that are more common for people who are older than age 28. Falls are a major cause of broken bones and head injuries in people who are older than age 48. Take precautions to prevent a fall at home. Work with your health care provider to learn what changes you can make to improve your health and wellness and to prevent falls. This information is not intended to replace advice given to you by your health care provider. Make sure you discuss any questions you have with your health care provider. Document Revised: 09/08/2020 Document Reviewed: 09/08/2020 Elsevier Patient Education  2024 [employer].

## 2023-08-04 NOTE — Assessment & Plan Note (Addendum)
 EKG from 07/29/2023 reviewed. Sinus bradycardia with first-degree AV block Clinically stable.  Asymptomatic. Patient is scheduled for permanent pacemaker placement May 13

## 2023-08-04 NOTE — Progress Notes (Signed)
 [name] [name] 73 y.o.   Chief Complaint  Patient presents with   Follow-up    Patient states she thought this was her physical informed her the provider will discuss this with her. Wanted to know if there is anything else she can take for the pain, she is only taking tylenol. Patient wants to go over her last MRI. Issues with her left ear. Patient is having an implant surgery in May 13. Needs a new parking placard. Had questions about getting a medical alert.     HISTORY OF PRESENT ILLNESS: This is a 73 y.o. female here for follow-up of chronic medical conditions  Wants to go over the following things: 1.  Recent MRI of lumbar spine showed spondylitis.  Has chronic bilateral sciatica pain.  Has questions about pain management 2.  Chronic insomnia.  Uses CPAP machine.  Has questions about over-the-counter sleeping aids 3.  Scheduled for permanent pacemaker next May 13 due to chronic bradycardia No other complaints or medical concerns today.  HPI   Prior to Admission medications   Medication Sig Start Date End Date Taking? Authorizing Provider  aspirin 81 MG chewable tablet Chew 81 mg by mouth daily.   Yes [provider]  carbamazepine (TEGRETOL) 200 MG tablet TAKE 1 AND 1/2 TABLETS BY MOUTH TWO TIMES A DAY 01/24/23  Yes Glean Salvo, NP  cholecalciferol (VITAMIN D3) 25 MCG (1000 UT) tablet Take 1,000 Units by mouth daily.   Yes [provider]  escitalopram (LEXAPRO) 10 MG tablet TAKE 1 TABLET(10 MG) BY MOUTH AT BEDTIME 02/21/23  Yes Gitel Beste, Eilleen Kempf, MD  ezetimibe (ZETIA) 10 MG tablet TAKE 1 TABLET(10 MG) BY MOUTH DAILY 04/05/23  Yes Little Ishikawa, MD  levETIRAcetam (KEPPRA) 750 MG tablet TAKE 2 TABLETS(1500 MG) BY MOUTH TWICE DAILY 10/28/22  Yes Glean Salvo, NP  lisinopril-hydrochlorothiazide (ZESTORETIC) 20-25 MG tablet TAKE 1 TABLET BY MOUTH DAILY 02/21/23  Yes Nguyen Todorov, Eilleen Kempf, MD  Multiple Vitamins-Minerals (MULTIVITAMIN PO) Take 1  tablet by mouth daily.   Yes [provider]  nitroGLYCERIN (NITROSTAT) 0.4 MG SL tablet Place 1 tablet (0.4 mg total) under the tongue every 5 (five) minutes as needed for chest pain. 12/21/21  Yes Little Ishikawa, MD  rosuvastatin (CRESTOR) 40 MG tablet TAKE 1 TABLET(40 MG) BY MOUTH DAILY 12/27/22  Yes Little Ishikawa, MD    Allergies  Allergen Reactions   Zetia [Ezetimibe]     Palpitations     Patient Active Problem List   Diagnosis Date Noted   Bilateral sciatica 02/03/2023   Palpitations 04/08/2022   Tiredness 04/08/2022   Dyspnea on exertion 12/17/2021   Seizure disorder (HCC) 05/28/2019   Prediabetes 05/28/2019   Staring episodes 11/24/2017   OSA (obstructive sleep apnea) 11/30/2016   Chronic insomnia 10/13/2016   Transient cerebral ischemia 08/26/2016   Transient memory loss 08/26/2016   Morbid obesity (HCC) 05/31/2016   Glucose intolerance (impaired glucose tolerance) 05/18/2016   Pronation deformity of ankle, acquired 04/19/2014   Generalized anxiety disorder 03/15/2014   Memory difficulty 03/14/2014   Demand myocardial ischemic related infarction 01/23/2014    Class: Diagnosis of   Depression, reactive 10/12/2012   Chronic neutropenia (HCC) 04/14/2012   Dyslipidemia 10/20/2011   HTN (hypertension) 10/20/2011    Past Medical History:  Diagnosis Date   Anxiety    Cataract    Chronic insomnia 10/13/2016   Depression    Gait disorder 03/14/2014   HTN (hypertension)    Hx  of cardiovascular stress test    Lexiscan Myoview (1/16):  No ischemia, EF 68%, breast atten; Low Risk   Hyperlipemia    Hyperlipidemia    Phreesia 07/18/2020   Hypertension    Phreesia 07/18/2020   Memory difficulty 03/14/2014   Meningitis due to bacteria 01-18-14   OSA (obstructive sleep apnea) 11/30/2016   Seizures (HCC)    Sleep apnea    Phreesia 07/18/2020   TIA (transient ischemic attack)     Past Surgical History:  Procedure Laterality Date   NO PAST  SURGERIES      Social History   Socioeconomic History   Marital status: Single    Spouse name: Not on file   Number of children: 1   Years of education: college   Highest education level: Some college, no degree  Occupational History   Occupation: Retired Financial planner: [employer]  Tobacco Use   Smoking status: Former    Current packs/day: 0.00    Average packs/day: 0.5 packs/day for 40.0 years (20.0 ttl pk-yrs)    Types: Cigarettes    Start date: 07/09/1971    Quit date: 07/09/2011    Years since quitting: 12.0   Smokeless tobacco: Never  Vaping Use   Vaping status: Never Used  Substance and Sexual Activity   Alcohol use: Not Currently    Comment: Rare   Drug use: No   Sexual activity: Never    Birth control/protection: Abstinence  Other Topics Concern   Not on file  Social History Narrative   Marital status: divorced x 35 years; not dating; not interested in 2019.      Children: 1 child/son (44); 2 grandchildren; [place]      Lives:  Alone in [place] in [place].      Employment: retired 2008; Administrator, arts.      Tobacco: quit smoking in 2013.  Smoked x many years       Alcohol: none; holidays only      Exercise: none; quit exercising two years ago in 2015.        Seatbelt: 100%; no texting. Does not drive in 1610 due to seizure activity.       ADLs: no driving in [id number] due to seizure activity; independent with ADLs.  Has cane for long distances      Advanced Directives: none; [name]/sister.  FULL CODE no prolonged measures.         Patient is single lives at home alone, has 1 child   Patient is right handed   Education level is 1 year of college   Caffeine consumption is 2 cups daily   Social Drivers of Health   Financial Resource Strain: Low Risk  (08/03/2023)   Overall Financial Resource Strain (CARDIA)    Difficulty of Paying Living Expenses: Not hard at all  Food Insecurity: No Food Insecurity (08/03/2023)   Hunger Vital Sign     Worried About Running Out of Food in the Last Year: Never true    Ran Out of Food in the Last Year: Never true  Transportation Needs: No Transportation Needs (08/03/2023)   PRAPARE - Administrator, Civil Service (Medical): No    Lack of Transportation (Non-Medical): No  Physical Activity: Inactive (08/03/2023)   Exercise Vital Sign    Days of Exercise per Week: 0 days    Minutes of Exercise per Session: 0 min  Stress: No Stress Concern Present (08/03/2023)   [employer] of Occupational Health -  Occupational Stress Questionnaire    Feeling of Stress : Only a little  Social Connections: Moderately Integrated (08/03/2023)   Social Connection and Isolation Panel [NHANES]    Frequency of Communication with Friends and Family: More than three times a week    Frequency of Social Gatherings with Friends and Family: Twice a week    Attends Religious Services: More than 4 times per year    Active Member of [employer] or Organizations: Yes    Attends Engineer, structural: More than 4 times per year    Marital Status: Divorced  Intimate Partner Violence: Not At Risk (10/14/2022)   Humiliation, Afraid, Rape, and Kick questionnaire    Fear of Current or Ex-Partner: No    Emotionally Abused: No    Physically Abused: No    Sexually Abused: No    Family History  Problem Relation Age of Onset   Heart attack Mother 52   Stroke Mother    Hypertension Mother    Heart attack Father 30   Cerebral aneurysm Sister    Hypertension Sister    Kidney disease Sister        ESRD; HD in last year   Hypertension Brother    Hyperlipidemia Brother    Rheum arthritis Sister    Arthritis Sister    Hypertension Brother    Hyperlipidemia Brother    Hyperlipidemia Brother    Hypertension Brother      Review of Systems  Constitutional: Negative.  Negative for chills and fever.  HENT: Negative.  Negative for congestion and sore throat.   Respiratory: Negative.  Negative for cough and  shortness of breath.   Cardiovascular: Negative.  Negative for chest pain and palpitations.  Gastrointestinal:  Negative for abdominal pain, diarrhea, nausea and vomiting.  Genitourinary: Negative.  Negative for dysuria and frequency.  Musculoskeletal:  Positive for back pain.  Skin: Negative.  Negative for rash.  Neurological: Negative.  Negative for dizziness and headaches.  Psychiatric/Behavioral:  The patient has insomnia.   All other systems reviewed and are negative.   Vitals:   08/04/23 1009  BP: 110/84  Pulse: (!) 48  Temp: 98.4 F (36.9 C)  SpO2: 97%    Physical Exam Vitals reviewed.  Constitutional:      Appearance: Normal appearance.  HENT:     Head: Normocephalic.     Right Ear: Tympanic membrane, ear canal and external ear normal.     Left Ear: Tympanic membrane, ear canal and external ear normal.     Mouth/Throat:     Mouth: Mucous membranes are moist.     Pharynx: Oropharynx is clear.  Eyes:     Extraocular Movements: Extraocular movements intact.     Pupils: Pupils are equal, round, and reactive to light.  Cardiovascular:     Rate and Rhythm: Regular rhythm. Bradycardia present.     Pulses: Normal pulses.     Heart sounds: Normal heart sounds.  Pulmonary:     Effort: Pulmonary effort is normal.     Breath sounds: Normal breath sounds.  Musculoskeletal:     Cervical back: No tenderness.  Lymphadenopathy:     Cervical: No cervical adenopathy.  Skin:    General: Skin is warm and dry.     Capillary Refill: Capillary refill takes less than 2 seconds.  Neurological:     General: No focal deficit present.     Mental Status: She is alert and oriented to person, place, and time.  Psychiatric:  Mood and Affect: Mood normal.        Behavior: Behavior normal.      ASSESSMENT & PLAN: A total of 44 minutes was spent with the patient and counseling/coordination of care regarding preparing for this visit, review of most recent office visit notes,  review of multiple chronic medical conditions and their management, review of all medications, review of most recent bloodwork results, review of health maintenance items, education on nutrition, prognosis, documentation, and need for follow up.   Problem List Items Addressed This Visit       Cardiovascular and Mediastinum   HTN (hypertension) - Primary (Chronic)   BP Readings from Last 3 Encounters:  08/04/23 110/84  07/29/23 102/70  06/28/23 (!) 109/53  Well-controlled hypertension Continue Zestoretic 20-25 mg daily Cardiovascular risks associated with hypertension discussed Dietary approaches to stop hypertension discussed       Relevant Orders   CBC with Differential/Platelet   Comprehensive metabolic panel with GFR   Hemoglobin A1c   Lipid panel   TSH     Respiratory   OSA (obstructive sleep apnea)   Stable.  On CPAP treatment        Nervous and Auditory   Seizure disorder (HCC)   Stable and well-controlled Continues carbamazepine and Keppra.  Medications managed by neurologist      Relevant Orders   CBC with Differential/Platelet   Comprehensive metabolic panel with GFR   Hemoglobin A1c   Lipid panel   TSH   Bilateral sciatica   Chronic and affecting quality of life Lumbar spine MRI results reviewed Pain management discussed.  Recommend Tylenol as needed      Relevant Orders   CBC with Differential/Platelet   Comprehensive metabolic panel with GFR   Hemoglobin A1c   Lipid panel   TSH     Other   Dyslipidemia   Diet and nutrition discussed Lipid profile done today Continue rosuvastatin 40 mg daily        Relevant Orders   CBC with Differential/Platelet   Comprehensive metabolic panel with GFR   Hemoglobin A1c   Lipid panel   TSH   Morbid obesity (HCC)   Relevant Orders   CBC with Differential/Platelet   Comprehensive metabolic panel with GFR   Hemoglobin A1c   Lipid panel   TSH   Chronic insomnia   Chronic and affecting quality of  life Sleep hygiene measures discussed Recommend melatonin 10 to 20 mg at bedtime as needed      Relevant Orders   CBC with Differential/Platelet   Comprehensive metabolic panel with GFR   Hemoglobin A1c   Lipid panel   TSH   Prediabetes   Diet and nutrition discussed Hemoglobin A1c done today      Relevant Orders   CBC with Differential/Platelet   Comprehensive metabolic panel with GFR   Hemoglobin A1c   Lipid panel   TSH   Bradycardia   EKG from 07/29/2023 reviewed. Sinus bradycardia with first-degree AV block Clinically stable.  Asymptomatic. Patient is scheduled for permanent pacemaker placement May 13      Relevant Orders   CBC with Differential/Platelet   Comprehensive metabolic panel with GFR   Hemoglobin A1c   Lipid panel   TSH   Patient Instructions  Health Maintenance After Age 21 After age 40, you are at a higher risk for certain long-term diseases and infections as well as injuries from falls. Falls are a major cause of broken bones and head injuries in people who are  older than age 21. Getting regular preventive care can help to keep you healthy and well. Preventive care includes getting regular testing and making lifestyle changes as recommended by your health care provider. Talk with your health care provider about: Which screenings and tests you should have. A screening is a test that checks for a disease when you have no symptoms. A diet and exercise plan that is right for you. What should I know about screenings and tests to prevent falls? Screening and testing are the best ways to find a health problem early. Early diagnosis and treatment give you the best chance of managing medical conditions that are common after age 28. Certain conditions and lifestyle choices may make you more likely to have a fall. Your health care provider may recommend: Regular vision checks. Poor vision and conditions such as cataracts can make you more likely to have a fall. If you  wear glasses, make sure to get your prescription updated if your vision changes. Medicine review. Work with your health care provider to regularly review all of the medicines you are taking, including over-the-counter medicines. Ask your health care provider about any side effects that may make you more likely to have a fall. Tell your health care provider if any medicines that you take make you feel dizzy or sleepy. Strength and balance checks. Your health care provider may recommend certain tests to check your strength and balance while standing, walking, or changing positions. Foot health exam. Foot pain and numbness, as well as not wearing proper footwear, can make you more likely to have a fall. Screenings, including: Osteoporosis screening. Osteoporosis is a condition that causes the bones to get weaker and break more easily. Blood pressure screening. Blood pressure changes and medicines to control blood pressure can make you feel dizzy. Depression screening. You may be more likely to have a fall if you have a fear of falling, feel depressed, or feel unable to do activities that you used to do. Alcohol use screening. Using too much alcohol can affect your balance and may make you more likely to have a fall. Follow these instructions at home: Lifestyle Do not drink alcohol if: Your health care provider tells you not to drink. If you drink alcohol: Limit how much you have to: 0-1 drink a day for women. 0-2 drinks a day for men. Know how much alcohol is in your drink. In the [place] one drink equals one 12 oz bottle of beer (355 mL), one 5 oz glass of wine (148 mL), or one 1 oz glass of hard liquor (44 mL). Do not use any products that contain nicotine or tobacco. These products include cigarettes, chewing tobacco, and vaping devices, such as e-cigarettes. If you need help quitting, ask your health care provider. Activity  Follow a regular exercise program to stay fit. This will help you  maintain your balance. Ask your health care provider what types of exercise are appropriate for you. If you need a cane or walker, use it as recommended by your health care provider. Wear supportive shoes that have nonskid soles. Safety  Remove any tripping hazards, such as rugs, cords, and clutter. Install safety equipment such as grab bars in bathrooms and safety rails on stairs. Keep rooms and walkways well-lit. General instructions Talk with your health care provider about your risks for falling. Tell your health care provider if: You fall. Be sure to tell your health care provider about all falls, even ones that seem minor. You feel  dizzy, tiredness (fatigue), or off-balance. Take over-the-counter and prescription medicines only as told by your health care provider. These include supplements. Eat a healthy diet and maintain a healthy weight. A healthy diet includes low-fat dairy products, low-fat (lean) meats, and fiber from whole grains, beans, and lots of fruits and vegetables. Stay current with your vaccines. Schedule regular health, dental, and eye exams. Summary Having a healthy lifestyle and getting preventive care can help to protect your health and wellness after age 68. Screening and testing are the best way to find a health problem early and help you avoid having a fall. Early diagnosis and treatment give you the best chance for managing medical conditions that are more common for people who are older than age 68. Falls are a major cause of broken bones and head injuries in people who are older than age 94. Take precautions to prevent a fall at home. Work with your health care provider to learn what changes you can make to improve your health and wellness and to prevent falls. This information is not intended to replace advice given to you by your health care provider. Make sure you discuss any questions you have with your health care provider. Document Revised: 09/08/2020  Document Reviewed: 09/08/2020 Elsevier Patient Education  2024 [employer] Inc[employer]    Edwina Barth, MD [facility]

## 2023-08-04 NOTE — Assessment & Plan Note (Signed)
 Chronic and affecting quality of life Lumbar spine MRI results reviewed Pain management discussed.  Recommend Tylenol as needed

## 2023-08-04 NOTE — Assessment & Plan Note (Signed)
Diet and nutrition discussed.  Lipid profile done today. Continue rosuvastatin 40 mg daily.

## 2023-08-04 NOTE — Assessment & Plan Note (Signed)
 BP Readings from Last 3 Encounters:  08/04/23 110/84  07/29/23 102/70  06/28/23 (!) 109/53  Well-controlled hypertension Continue Zestoretic 20-25 mg daily Cardiovascular risks associated with hypertension discussed Dietary approaches to stop hypertension discussed

## 2023-08-04 NOTE — Assessment & Plan Note (Signed)
Chronic and affecting quality of life Sleep hygiene measures discussed Recommend melatonin 10 to 20 mg at bedtime as needed

## 2023-08-04 NOTE — Assessment & Plan Note (Signed)
Diet and nutrition discussed.  Hemoglobin A1c done today. 

## 2023-08-04 NOTE — Assessment & Plan Note (Signed)
Stable and well-controlled Continues carbamazepine and Keppra.  Medications managed by neurologist

## 2023-08-04 NOTE — Assessment & Plan Note (Signed)
Stable.  On CPAP treatment. 

## 2023-08-16 LAB — HM DIABETES EYE EXAM

## 2023-08-20 ENCOUNTER — Other Ambulatory Visit: Payer: Self-pay | Admitting: Emergency Medicine

## 2023-08-25 ENCOUNTER — Telehealth (HOSPITAL_COMMUNITY): Payer: Self-pay

## 2023-08-25 NOTE — Telephone Encounter (Signed)
 Spoke with patient to complete pre-procedure call.     New medical conditions?  No Recent hospitalizations or surgeries? No Started any new medications? No Patient made aware to contact office to inform of any new medications started. Any changes in activities of daily living? No  Pre-procedure testing scheduled: lab work ordered  Do not take your aspirin  for 5 days prior to procedure - your last dose will be Wednesday, May 7.  Confirmed patient is scheduled for  Permanent transvenous pacemaker (PPM) on Tuesday, May 13 with Dr. Marlane Silver. Instructed patient to arrive at the Main Entrance A at [facility]: [address] [place], [place] [address] and check in at Admitting at 1:00 PM.  Advised of plan to go home the same day and will only stay overnight if medically necessary. You MUST have a responsible adult to drive you home and MUST be with you the first 24 hours after you arrive home or your procedure could be cancelled.  Patient verbalized understanding to information provided and is agreeable to proceed with procedure.

## 2023-08-26 DIAGNOSIS — R002 Palpitations: Secondary | ICD-10-CM | POA: Diagnosis not present

## 2023-08-26 DIAGNOSIS — R001 Bradycardia, unspecified: Secondary | ICD-10-CM | POA: Diagnosis not present

## 2023-08-26 LAB — BASIC METABOLIC PANEL WITH GFR
BUN/Creatinine Ratio: 23 (ref 12–28)
BUN: 27 mg/dL (ref 8–27)
CO2: 24 mmol/L (ref 20–29)
Calcium: 9.2 mg/dL (ref 8.7–10.3)
Chloride: 102 mmol/L (ref 96–106)
Creatinine, Ser: 1.19 mg/dL — ABNORMAL HIGH (ref 0.57–1.00)
Glucose: 79 mg/dL (ref 70–99)
Potassium: 4.3 mmol/L (ref 3.5–5.2)
Sodium: 142 mmol/L (ref 134–144)
eGFR: 49 mL/min/{1.73_m2} — ABNORMAL LOW (ref >59)

## 2023-08-27 LAB — CBC
Hematocrit: 33.8 % — ABNORMAL LOW (ref 34.0–46.6)
Hemoglobin: 11 g/dL — ABNORMAL LOW (ref 11.1–15.9)
MCH: 26.3 pg — ABNORMAL LOW (ref 26.6–33.0)
MCHC: 32.5 g/dL (ref 31.5–35.7)
MCV: 81 fL (ref 79–97)
Platelets: 211 10*3/uL (ref 150–450)
RBC: 4.19 x10E6/uL (ref 3.77–5.28)
RDW: 13.1 % (ref 11.7–15.4)
WBC: 2.7 10*3/uL — ABNORMAL LOW (ref 3.4–10.8)

## 2023-08-30 ENCOUNTER — Encounter: Payer: Self-pay | Admitting: Cardiovascular Disease

## 2023-09-06 ENCOUNTER — Telehealth (HOSPITAL_COMMUNITY): Payer: Self-pay

## 2023-09-06 NOTE — Telephone Encounter (Signed)
 Call placed to patient to discuss upcoming procedure.   Confirmed patient is scheduled for a Permanent transvenous pacemaker (PPM) on Tuesday, May 13 with Dr. Marlane Silver. Instructed patient to arrive at the Main Entrance A at [facility]: [address] [place], [place] [address] and check in at Admitting at 1:00 PM.   Labs completed  Any recent signs of acute illness or been started on antibiotics? No Any new medications started? No Any medications to hold? Hold ASA for 5 days Medication instructions:  On the morning of your procedure you may take all of your other morning medications as prescribed except Aspirin  with only small sips of water.  You may eat a LIGHT breakfast BEFORE 7 am on the day of procedure - NOTHING to eat or drink after 7 am the day of procedure.   The night before your procedure and the morning of your procedure, wash thoroughly with the CHG surgical soap from the neck down, paying special attention to the area where your procedure will be performed.  Advised of plan to go home the same day and will only stay overnight if medically necessary. You MUST have a responsible adult to drive you home and MUST be with you the first 24 hours after you arrive home.  Patient verbalized understanding to all instructions provided and agreed to proceed with procedure.

## 2023-09-07 ENCOUNTER — Other Ambulatory Visit: Payer: Self-pay

## 2023-09-07 ENCOUNTER — Ambulatory Visit: Payer: Self-pay

## 2023-09-07 ENCOUNTER — Telehealth: Payer: Self-pay | Admitting: Cardiovascular Disease

## 2023-09-07 ENCOUNTER — Inpatient Hospital Stay (HOSPITAL_BASED_OUTPATIENT_CLINIC_OR_DEPARTMENT_OTHER)
Admission: EM | Admit: 2023-09-07 | Discharge: 2023-09-09 | DRG: 378 | Disposition: A | Discharge status: Home / Self Care | Attending: Internal Medicine | Admitting: Internal Medicine

## 2023-09-07 ENCOUNTER — Encounter (HOSPITAL_BASED_OUTPATIENT_CLINIC_OR_DEPARTMENT_OTHER): Payer: Self-pay | Admitting: Emergency Medicine

## 2023-09-07 DIAGNOSIS — I129 Hypertensive chronic kidney disease with stage 1 through stage 4 chronic kidney disease, or unspecified chronic kidney disease: Secondary | ICD-10-CM | POA: Diagnosis present

## 2023-09-07 DIAGNOSIS — Z01810 Encounter for preprocedural cardiovascular examination: Secondary | ICD-10-CM | POA: Diagnosis not present

## 2023-09-07 DIAGNOSIS — K5900 Constipation, unspecified: Secondary | ICD-10-CM | POA: Diagnosis not present

## 2023-09-07 DIAGNOSIS — F419 Anxiety disorder, unspecified: Secondary | ICD-10-CM | POA: Diagnosis present

## 2023-09-07 DIAGNOSIS — G40909 Epilepsy, unspecified, not intractable, without status epilepticus: Secondary | ICD-10-CM

## 2023-09-07 DIAGNOSIS — Z79899 Other long term (current) drug therapy: Secondary | ICD-10-CM

## 2023-09-07 DIAGNOSIS — E669 Obesity, unspecified: Secondary | ICD-10-CM | POA: Diagnosis not present

## 2023-09-07 DIAGNOSIS — I700 Atherosclerosis of aorta: Secondary | ICD-10-CM | POA: Diagnosis not present

## 2023-09-07 DIAGNOSIS — Z87891 Personal history of nicotine dependence: Secondary | ICD-10-CM

## 2023-09-07 DIAGNOSIS — E119 Type 2 diabetes mellitus without complications: Secondary | ICD-10-CM | POA: Diagnosis not present

## 2023-09-07 DIAGNOSIS — K625 Hemorrhage of anus and rectum: Secondary | ICD-10-CM | POA: Diagnosis present

## 2023-09-07 DIAGNOSIS — E785 Hyperlipidemia, unspecified: Secondary | ICD-10-CM | POA: Diagnosis not present

## 2023-09-07 DIAGNOSIS — F5104 Psychophysiologic insomnia: Secondary | ICD-10-CM | POA: Diagnosis not present

## 2023-09-07 DIAGNOSIS — E1122 Type 2 diabetes mellitus with diabetic chronic kidney disease: Secondary | ICD-10-CM | POA: Diagnosis present

## 2023-09-07 DIAGNOSIS — N1832 Chronic kidney disease, stage 3b: Secondary | ICD-10-CM | POA: Diagnosis not present

## 2023-09-07 DIAGNOSIS — Z6841 Body Mass Index (BMI) 40.0 and over, adult: Secondary | ICD-10-CM

## 2023-09-07 DIAGNOSIS — K922 Gastrointestinal hemorrhage, unspecified: Secondary | ICD-10-CM | POA: Diagnosis not present

## 2023-09-07 DIAGNOSIS — I440 Atrioventricular block, first degree: Secondary | ICD-10-CM | POA: Diagnosis present

## 2023-09-07 DIAGNOSIS — K921 Melena: Secondary | ICD-10-CM | POA: Diagnosis not present

## 2023-09-07 DIAGNOSIS — Q630 Accessory kidney: Secondary | ICD-10-CM | POA: Diagnosis not present

## 2023-09-07 DIAGNOSIS — R2689 Other abnormalities of gait and mobility: Secondary | ICD-10-CM | POA: Diagnosis not present

## 2023-09-07 DIAGNOSIS — R9431 Abnormal electrocardiogram [ECG] [EKG]: Secondary | ICD-10-CM | POA: Diagnosis not present

## 2023-09-07 DIAGNOSIS — Z950 Presence of cardiac pacemaker: Secondary | ICD-10-CM

## 2023-09-07 DIAGNOSIS — D631 Anemia in chronic kidney disease: Secondary | ICD-10-CM | POA: Diagnosis not present

## 2023-09-07 DIAGNOSIS — D72819 Decreased white blood cell count, unspecified: Secondary | ICD-10-CM | POA: Diagnosis not present

## 2023-09-07 DIAGNOSIS — G459 Transient cerebral ischemic attack, unspecified: Secondary | ICD-10-CM | POA: Diagnosis present

## 2023-09-07 DIAGNOSIS — I69398 Other sequelae of cerebral infarction: Secondary | ICD-10-CM | POA: Diagnosis not present

## 2023-09-07 DIAGNOSIS — N183 Chronic kidney disease, stage 3 unspecified: Secondary | ICD-10-CM | POA: Insufficient documentation

## 2023-09-07 DIAGNOSIS — N1831 Chronic kidney disease, stage 3a: Secondary | ICD-10-CM | POA: Diagnosis not present

## 2023-09-07 DIAGNOSIS — Z8249 Family history of ischemic heart disease and other diseases of the circulatory system: Secondary | ICD-10-CM

## 2023-09-07 DIAGNOSIS — F32A Depression, unspecified: Secondary | ICD-10-CM | POA: Diagnosis not present

## 2023-09-07 DIAGNOSIS — Z8661 Personal history of infections of the central nervous system: Secondary | ICD-10-CM

## 2023-09-07 DIAGNOSIS — Z888 Allergy status to other drugs, medicaments and biological substances status: Secondary | ICD-10-CM

## 2023-09-07 DIAGNOSIS — Z886 Allergy status to analgesic agent status: Secondary | ICD-10-CM

## 2023-09-07 DIAGNOSIS — R001 Bradycardia, unspecified: Secondary | ICD-10-CM | POA: Diagnosis present

## 2023-09-07 DIAGNOSIS — H269 Unspecified cataract: Secondary | ICD-10-CM | POA: Diagnosis not present

## 2023-09-07 DIAGNOSIS — Z823 Family history of stroke: Secondary | ICD-10-CM

## 2023-09-07 DIAGNOSIS — Z7982 Long term (current) use of aspirin: Secondary | ICD-10-CM

## 2023-09-07 DIAGNOSIS — Z841 Family history of disorders of kidney and ureter: Secondary | ICD-10-CM

## 2023-09-07 DIAGNOSIS — I10 Essential (primary) hypertension: Secondary | ICD-10-CM | POA: Diagnosis not present

## 2023-09-07 DIAGNOSIS — K649 Unspecified hemorrhoids: Secondary | ICD-10-CM | POA: Diagnosis not present

## 2023-09-07 DIAGNOSIS — G4733 Obstructive sleep apnea (adult) (pediatric): Secondary | ICD-10-CM | POA: Diagnosis present

## 2023-09-07 DIAGNOSIS — I69311 Memory deficit following cerebral infarction: Secondary | ICD-10-CM | POA: Diagnosis not present

## 2023-09-07 DIAGNOSIS — Z83438 Family history of other disorder of lipoprotein metabolism and other lipidemia: Secondary | ICD-10-CM

## 2023-09-07 DIAGNOSIS — Z8261 Family history of arthritis: Secondary | ICD-10-CM

## 2023-09-07 LAB — COMPREHENSIVE METABOLIC PANEL WITH GFR
ALT: 14 U/L (ref 0–44)
AST: 17 U/L (ref 15–41)
Albumin: 3.9 g/dL (ref 3.5–5.0)
Alkaline Phosphatase: 109 U/L (ref 38–126)
Anion gap: 11 (ref 5–15)
BUN: 31 mg/dL — ABNORMAL HIGH (ref 8–23)
CO2: 26 mmol/L (ref 22–32)
Calcium: 9.3 mg/dL (ref 8.9–10.3)
Chloride: 103 mmol/L (ref 98–111)
Creatinine, Ser: 1.07 mg/dL — ABNORMAL HIGH (ref 0.44–1.00)
GFR, Estimated: 55 mL/min — ABNORMAL LOW (ref >60)
Glucose, Bld: 90 mg/dL (ref 70–99)
Potassium: 3.8 mmol/L (ref 3.5–5.1)
Sodium: 141 mmol/L (ref 135–145)
Total Bilirubin: <0.2 mg/dL (ref 0.0–1.2)
Total Protein: 6 g/dL — ABNORMAL LOW (ref 6.5–8.1)

## 2023-09-07 LAB — CBC WITH DIFFERENTIAL/PLATELET
Abs Immature Granulocytes: 0.01 10*3/uL (ref 0.00–0.07)
Basophils Absolute: 0 10*3/uL (ref 0.0–0.1)
Basophils Relative: 0 %
Eosinophils Absolute: 0.1 10*3/uL (ref 0.0–0.5)
Eosinophils Relative: 2 %
HCT: 32.5 % — ABNORMAL LOW (ref 36.0–46.0)
Hemoglobin: 10.1 g/dL — ABNORMAL LOW (ref 12.0–15.0)
Immature Granulocytes: 0 %
Lymphocytes Relative: 20 %
Lymphs Abs: 0.7 10*3/uL (ref 0.7–4.0)
MCH: 26.1 pg (ref 26.0–34.0)
MCHC: 31.1 g/dL (ref 30.0–36.0)
MCV: 84 fL (ref 80.0–100.0)
Monocytes Absolute: 0.4 10*3/uL (ref 0.1–1.0)
Monocytes Relative: 10 %
Neutro Abs: 2.4 10*3/uL (ref 1.7–7.7)
Neutrophils Relative %: 68 %
Platelets: 189 10*3/uL (ref 150–400)
RBC: 3.87 MIL/uL (ref 3.87–5.11)
RDW: 13.4 % (ref 11.5–15.5)
WBC: 3.6 10*3/uL — ABNORMAL LOW (ref 4.0–10.5)
nRBC: 0 % (ref 0.0–0.2)

## 2023-09-07 LAB — PROTIME-INR
INR: 0.9 (ref 0.8–1.2)
Prothrombin Time: 12.7 s (ref 11.4–15.2)

## 2023-09-07 LAB — OCCULT BLOOD X 1 CARD TO LAB, STOOL: Fecal Occult Bld: POSITIVE — AB

## 2023-09-07 MED ORDER — ROSUVASTATIN CALCIUM 20 MG PO TABS
40.0000 mg | ORAL_TABLET | Freq: Every day | ORAL | Status: DC
  Administered 2023-09-08 – 2023-09-09 (×2): 40 mg via ORAL
  Filled 2023-09-07 (×2): qty 2

## 2023-09-07 MED ORDER — ONDANSETRON HCL 4 MG/2ML IJ SOLN: 4.0000 mg | Freq: Four times a day (QID) | INTRAMUSCULAR | Status: DC | PRN

## 2023-09-07 MED ORDER — LEVETIRACETAM 500 MG PO TABS
1500.0000 mg | ORAL_TABLET | Freq: Two times a day (BID) | ORAL | Status: DC
Start: 2023-09-07 — End: 2023-09-09
  Administered 2023-09-07 – 2023-09-09 (×4): 1500 mg via ORAL
  Filled 2023-09-07 (×4): qty 3

## 2023-09-07 MED ORDER — SODIUM CHLORIDE 0.9% FLUSH
3.0000 mL | Freq: Two times a day (BID) | INTRAVENOUS | Status: DC
  Administered 2023-09-07 – 2023-09-09 (×4): 3 mL via INTRAVENOUS

## 2023-09-07 MED ORDER — ESCITALOPRAM OXALATE 10 MG PO TABS
10.0000 mg | ORAL_TABLET | Freq: Every day | ORAL | Status: DC
  Administered 2023-09-07 – 2023-09-08 (×2): 10 mg via ORAL
  Filled 2023-09-07 (×2): qty 1

## 2023-09-07 MED ORDER — SODIUM CHLORIDE 0.9 % IV BOLUS
500.0000 mL | Freq: Once | INTRAVENOUS | Status: AC
  Administered 2023-09-07: 500 mL via INTRAVENOUS

## 2023-09-07 MED ORDER — CARBAMAZEPINE 200 MG PO TABS
300.0000 mg | ORAL_TABLET | Freq: Two times a day (BID) | ORAL | Status: DC
  Administered 2023-09-07 – 2023-09-09 (×4): 300 mg via ORAL
  Filled 2023-09-07 (×5): qty 1.5

## 2023-09-07 MED ORDER — ACETAMINOPHEN 500 MG PO TABS
1000.0000 mg | ORAL_TABLET | Freq: Four times a day (QID) | ORAL | Status: DC | PRN
Start: 2023-09-07 — End: 2023-09-09

## 2023-09-07 MED ORDER — MELATONIN 3 MG PO TABS: 6.0000 mg | ORAL_TABLET | Freq: Every evening | ORAL | Status: DC | PRN

## 2023-09-07 NOTE — Telephone Encounter (Signed)
 New Message:     Patient called to let you know that she was on her way to [facility] did not need this at this time.

## 2023-09-07 NOTE — ED Triage Notes (Signed)
 C/o bright red rectal bleeding starting today. Denies ABD pain. Hx of similar episode approx 4 months prior. States blood is "dripping"   Brady (40s) during triage. States she is scheduled for pacemaker on Tuesday . No blood thinners.

## 2023-09-07 NOTE — Telephone Encounter (Signed)
 Copied from CRM [id number]. Topic: Clinical - Red Word Triage >> Sep 07, 2023 12:14 PM Melissa C wrote: Red Word that prompted transfer to Nurse Triage: patient has been going one stool at a time no problem, then had bloody stool just now. Patient is scheduled to get a pacemaker on Tuesday and is concerned about what to do  Chief Complaint: Rectal Bleeding Symptoms: blood in stool Frequency: several months Pertinent Negatives: Patient denies fever, diarrhea, abdominal pain, nausea, vomiting, chest pain,  Disposition: [] ED /[] Urgent Care (no appt availability in office) / [x] Appointment(In office/virtual)/ []  [facility]/ [] Home Care/ [] Refused Recommended Disposition /[] [facility]/ []  Follow-up with PCP Additional Notes: Patient called and advised that she had several small stools today which is unusual. Patient then went to the bathroom again to have another bowel movement and she states that there was a lot of blood at that time. Patient states that she was going to ask her PCP when her last colonoscopy was but she had forgotten. Patient states that she has been having blood in her stools often for months and this is the 2nd incidence of a lot of blood being visualized.  She forgot to speak to her PCP about this. Patient states that she deals with constipation and she figured the blood she saw had to do with recent bouts of some constipation. Patient states today was the last day that she took an 81mg  baby Aspirin  and she denied taking any other blood thinner types of medications. Patient denies fever, diarrhea, abdominal pain, nausea, vomiting, chest pain.  Patient states that her first stools today did not have any blood in them, then she had a stool and states a large dark hick blood per patient that seemed to be just a clump of thick blood. After urinating later, when she wiped her rectum to check she saw some more blood as if it were dripping.  Patient is advised that  with her having more than one episode of this, often having blood within her stools she states, and her describing today what sounds like a large dark red blood clot (she states dark thick blood with no stool in it)--she is advised that at this time the recommendation is to go to the Emergency Room for further evaluation.  Patient is advised that this message will also be sent to her Primary Care Provider.  Patient states that she will get her family member and go to the Emergency Room so she can be checked out further.  Reason for Disposition  MODERATE rectal bleeding (small blood clots, passing blood without stool, or toilet water turns red)  [1] MODERATE rectal bleeding (small blood clots, passing blood without stool, or toilet water turns red) AND [2] more than once a day  Answer Assessment - Initial Assessment Questions 1. APPEARANCE of BLOOD: "What color is it?" "Is it passed separately, on the surface of the stool, or mixed in with the stool?"      Dark red in the water and light red around it 2. AMOUNT: "How much blood was passed?"      Patient states "a lot seemed like dripping as well" 3. FREQUENCY: "How many times has blood been passed with the stools?"      Patient states going on for months 4. ONSET: "When was the blood first seen in the stools?" (Days or weeks)      Patient states months 5. DIARRHEA: "Is there also some diarrhea?" If Yes, ask: "How many  diarrhea stools in the past 24 hours?"      Patient denies 6. CONSTIPATION: "Do you have constipation?" If Yes, ask: "How bad is it?"     "Maybe" 7. RECURRENT SYMPTOMS: "Have you had blood in your stools before?" If Yes, ask: "When was the last time?" and "What happened that time?"      Months ago but didn't get it checked out at that time 8. BLOOD THINNERS: "Do you take any blood thinners?" (e.g., Coumadin/warfarin, Pradaxa/dabigatran, aspirin )     No per patient   Today was the last day of taking an 81mg  Aspirin  before stopping  for pacemaker surgery 9. OTHER SYMPTOMS: "Do you have any other symptoms?"  (e.g., abdomen pain, vomiting, dizziness, fever)     Patient denies  Protocols used: Rectal Bleeding-A-AH

## 2023-09-07 NOTE — H&P (Signed)
 History and Physical    [name] [name] NWG:[id number] DOB: 15-Mar-1951 DOA: 09/07/2023  PCP: Elvira Hammersmith, MD   Patient coming from: [facility]    Chief Complaint:  Chief Complaint  Patient presents with   Rectal Bleeding    HPI:  [name] [name] is a 73 y.o. female with hx of symptomatic bradycardia plan for upcoming PPM implant 5/13, TIA, seizure disorder, CKD 3a, hypertension, hyperlipidemia, diabetes type 2, obesity, OSA, anxiety, who is transferred from [facility] ED with rectal bleeding.  Reports that she initially noted rectal bleeding about 4 to 5 months ago after a period of constipation she passed a large bowel movement with associated rectal pain and mixed dark and bright red blood.  Since then she has noted intermittent dark stool and bright red blood mixed in with her stool.  Today she experienced rectal urgency and passing 2 small normal appearing bowel movements.  Had another episode where she passed just large amount of dark and bright red blood.  Has also noted some leakage of bright red blood with passing urine after this initial episode.  Was taking aspirin , last dose this morning; this was her last dose before stopping for pacemaker next week.  Last colonoscopy in 2017 was normal.   Review of Systems:  ROS complete and negative except as marked above   Allergies  Allergen Reactions   Zetia  [Ezetimibe ] Palpitations    Prior to Admission medications   Medication Sig Start Date End Date Taking? Authorizing Provider  acetaminophen  (TYLENOL ) 325 MG tablet Take 650 mg by mouth every 6 (six) hours as needed for moderate pain (pain score 4-6).    [provider]  aspirin  81 MG chewable tablet Chew 81 mg by mouth daily.    [provider]  carbamazepine  (TEGRETOL ) 200 MG tablet TAKE 1 AND 1/2 TABLETS BY MOUTH TWO TIMES A DAY 01/24/23   Wess Hammed, NP  cholecalciferol (VITAMIN D3) 25 MCG (1000 UT) tablet Take 1,000 Units by mouth daily.     [provider]  escitalopram  (LEXAPRO ) 10 MG tablet TAKE 1 TABLET(10 MG) BY MOUTH AT BEDTIME 08/20/23   Elvira Hammersmith, MD  ezetimibe  (ZETIA ) 10 MG tablet TAKE 1 TABLET(10 MG) BY MOUTH DAILY 04/05/23   Wendie Hamburg, MD  levETIRAcetam  (KEPPRA ) 750 MG tablet TAKE 2 TABLETS(1500 MG) BY MOUTH TWICE DAILY 10/28/22   Wess Hammed, NP  lisinopril -hydrochlorothiazide  (ZESTORETIC ) 20-25 MG tablet TAKE 1 TABLET BY MOUTH DAILY 08/20/23   Elvira Hammersmith, MD  Melatonin 5 MG CHEW Chew 10 mg by mouth at bedtime as needed (sleep).    [provider]  Multiple Vitamins-Minerals (MULTIVITAMIN PO) Take 1 tablet by mouth daily.    [provider]  nitroGLYCERIN  (NITROSTAT ) 0.4 MG SL tablet Place 1 tablet (0.4 mg total) under the tongue every 5 (five) minutes as needed for chest pain. 12/21/21   Wendie Hamburg, MD  rosuvastatin  (CRESTOR ) 40 MG tablet TAKE 1 TABLET(40 MG) BY MOUTH DAILY 12/27/22   Wendie Hamburg, MD    Past Medical History:  Diagnosis Date   Anxiety    Cataract    Chronic insomnia 10/13/2016   Depression    Gait disorder 03/14/2014   HTN (hypertension)    Hx of cardiovascular stress test    Lexiscan  Myoview (1/16):  No ischemia, EF 68%, breast atten; Low Risk   Hyperlipemia    Hyperlipidemia    Phreesia 07/18/2020   Hypertension  Phreesia 07/18/2020   Memory difficulty 03/14/2014   Meningitis due to bacteria 01-18-14   OSA (obstructive sleep apnea) 11/30/2016   Seizures (HCC)    Sleep apnea    Phreesia 07/18/2020   TIA (transient ischemic attack)     Past Surgical History:  Procedure Laterality Date   NO PAST SURGERIES       reports that she quit smoking about 12 years ago. Her smoking use included cigarettes. She started smoking about 52 years ago. She has a 20 pack-year smoking history. She has never used smokeless tobacco. She reports that she does not currently use alcohol. She reports that she does not use  drugs.  Family History  Problem Relation Age of Onset   Heart attack Mother 52   Stroke Mother    Hypertension Mother    Heart attack Father 57   Cerebral aneurysm Sister    Hypertension Sister    Kidney disease Sister        ESRD; HD in last year   Hypertension Brother    Hyperlipidemia Brother    Rheum arthritis Sister    Arthritis Sister    Hypertension Brother    Hyperlipidemia Brother    Hyperlipidemia Brother    Hypertension Brother      Physical Exam: Vitals:   09/07/23 1645 09/07/23 1700 09/07/23 1827 09/07/23 1830  BP: (!) 136/58 (!) 135/45 130/60 (!) 114/59  Pulse: (!) 51 (!) 59 (!) 56 (!) 44  Resp: 11 15 17 14   Temp:  98 F (36.7 C)    SpO2: 99% 100% 95% 95%    Gen: Awake, alert, NAD   CV: Regular, bradycardic. normal S1, S2, no murmurs  Resp: Normal WOB, CTAB  Abd: Flat, normoactive, nontender MSK: Symmetric, no edema  Skin: No rashes or lesions to exposed skin  Neuro: Alert and interactive  Psych: euthymic, appropriate    Data review:   Labs reviewed, notable for:   Hemoglobin 10.1 (baseline 10-11), FOBT +  WBC 3.6. INR WNL Other chemistries unremarkable   Micro:  Results for orders placed or performed during the hospital encounter of 01/18/14  Urine culture     Status: None   Collection Time: 01/18/14  4:32 PM   Specimen: Urine, Catheterized  Result Value Ref Range Status   Specimen Description URINE, CATHETERIZED  Final   Special Requests NONE  Final   Culture  Setup Time   Final    01/18/2014 17:43 Performed at [facility]   Colony Count NO GROWTH Performed at [facility]  Final   Culture NO GROWTH Performed at [facility]  Final   Report Status 01/19/2014 FINAL  Final  Blood Culture (routine x 2)     Status: None   Collection Time: 01/18/14  4:49 PM   Specimen: Blood  Result Value Ref Range Status   Specimen Description BLOOD ARM RIGHT  Final   Special Requests BOTTLES DRAWN AEROBIC AND ANAEROBIC  5CC  Final   Culture  Setup Time   Final    01/18/2014 21:55 Performed at [facility]   Culture   Final    STREPTOCOCCUS PNEUMONIAE Note: Gram Stain Report Called to,Read Back By and Verified With: CHRIS A@ 1034 ON [id number] BY [facility] Performed at [facility]   Report Status 01/21/2014 FINAL  Final   Organism ID, Bacteria STREPTOCOCCUS PNEUMONIAE  Final      Susceptibility   Streptococcus pneumoniae - MIC (ETEST)*    CEFTRIAXONE  0.19 SENSITIVE  Sensitive     LEVOFLOXACIN 1.5 SENSITIVE Sensitive     PENICILLIN 0.25 INTERMEDIATE Intermediate     * STREPTOCOCCUS PNEUMONIAE  Blood Culture (routine x 2)     Status: None   Collection Time: 01/18/14  5:01 PM   Specimen: Blood  Result Value Ref Range Status   Specimen Description BLOOD HAND RIGHT  Final   Special Requests BOTTLES DRAWN AEROBIC AND ANAEROBIC 4CC  Final   Culture  Setup Time   Final    01/18/2014 21:57 Performed at [facility]   Culture   Final    STREPTOCOCCUS PNEUMONIAE Note: SUSCEPTIBILITIES PERFORMED ON PREVIOUS CULTURE WITHIN THE LAST 5 DAYS. Note: Gram Stain Report Called to,Read Back By and Verified With: CHRIS A@ 1034 ON [id number] BY [facility] Performed at [facility]   Report Status 01/21/2014 FINAL  Final  CSF culture     Status: None   Collection Time: 01/18/14  9:20 PM   Specimen: Back; Cerebrospinal Fluid  Result Value Ref Range Status   Specimen Description CSF  Final   Special Requests NO 2 2CC  Final   Gram Stain   Final    WBC PRESENT,BOTH PMN AND MONONUCLEAR NO ORGANISMS SEEN CYTOSPIN Performed at [facility] Performed at [facility]   Culture   Final    NO GROWTH 3 DAYS Performed at [facility]   Report Status 01/22/2014 FINAL  Final  Gram stain     Status: None   Collection Time: 01/18/14  9:20 PM   Specimen: Back; Cerebrospinal Fluid  Result Value Ref Range Status   Specimen Description CSF  Final   Special Requests NO 2 2CC  Final   Gram  Stain   Final    WBC PRESENT,BOTH PMN AND MONONUCLEAR NO ORGANISMS SEEN CYTOSPIN SLIDE   Report Status 01/18/2014 FINAL  Final  Viral culture     Status: None   Collection Time: 01/18/14  9:20 PM   Specimen: Cerebrospinal Fluid  Result Value Ref Range Status   Specimen Description CSF  Final   Special Requests NONE  Final   Culture   Final    No Herpes Simplex Detected in Cell Culture Performed at [facility]   Report Status 01/23/2014 FINAL  Final  MRSA PCR Screening     Status: None   Collection Time: 01/18/14 10:18 PM   Specimen: Nasopharyngeal  Result Value Ref Range Status   MRSA by PCR NEGATIVE NEGATIVE Final    Comment:        The GeneXpert MRSA Assay (FDA approved for NASAL specimens only), is one component of a comprehensive MRSA colonization surveillance program. It is not intended to diagnose MRSA infection nor to guide or monitor treatment for MRSA infections.  Culture, blood (routine x 2)     Status: None   Collection Time: 01/20/14 12:22 PM   Specimen: Blood  Result Value Ref Range Status   Specimen Description BLOOD RIGHT ARM  Final   Special Requests BOTTLES DRAWN AEROBIC ONLY 8CC  Final   Culture  Setup Time   Final    01/20/2014 18:15 Performed at [facility]   Culture   Final    NO GROWTH 5 DAYS Performed at [facility]   Report Status 01/26/2014 FINAL  Final  Culture, blood (routine x 2)     Status: None   Collection Time: 01/20/14  2:10 PM   Specimen: Blood  Result Value Ref Range Status  Specimen Description BLOOD RIGHT ARM  Final   Special Requests BOTTLES DRAWN AEROBIC AND ANAEROBIC 5CC  Final   Culture  Setup Time   Final    01/20/2014 18:14 Performed at [facility]   Culture   Final    NO GROWTH 5 DAYS Performed at [facility]   Report Status 01/26/2014 FINAL  Final    Imaging reviewed:  No results found.  EKG:  Personally reviewed sinus bradycardia with sinus arrhythmia, first-degree  AV block.  ED Course:  Treated with 500 cc IV fluid.  EDP notified [facility] who will see patient in the morning. Transferred from [facility] ED    Assessment/Plan:  73 y.o. female with hx symptomatic bradycardia plan for upcoming PPM implant 5/13, TIA, seizure disorder, CKD 3a, hypertension, hyperlipidemia, diabetes type 2, obesity, OSA, anxiety, who is transferred from [facility] ED for lower GI bleeding.   LGIB  Hx intermittent mixed dark stool / bright red blood x 4-5 months. Rectal urgency followed by large amount of dark and bright red blood this evening. Hemodynamically stable. Hb 10.1 (baseline 10-11). On aspirin  (last dose 5/7; to stop today for PPM next week). Last colonoscopy 2/'17 was normal.  - EDP notified [facility] who will consult in the morning. - Trend CBC, check type and screen - Clear liquid diet, n.p.o. after midnight - Hold aspirin  as already planned for PPM next week  Chronic medical problems: Symptomatic bradycardia: Tele monitoring, plan for upcoming PPM implant 5/13. Aspirin  on hold.  TIA: See above, aspirin  on hold. Continue home rosuvastatin .  Seizure disorder: Continue home Keppra , carbamazepine  CKD 3a: Baseline creatinine near 1.15 Hypertension: Hold home lisinopril -HCTZ in setting of GI bleed Hyperlipidemia: Continue home statin per above DM2: Diet controlled, Last A1c 6.5%. Not requiring SSI.  Obesity: Would benefit from weight loss outpatient.  OSA: CPAP nightly  Anxiety: Continue home Escitalopram     There is no height or weight on file to calculate BMI.    DVT prophylaxis:  SCDs Code Status:  Full Code Diet:  Diet Orders (From admission, onward)     Start     Ordered   09/08/23 0001  Diet NPO time specified  Diet effective midnight        09/07/23 1952   09/07/23 1951  Diet clear liquid Room service appropriate? Yes; Fluid consistency: Thin  Diet effective now       Question Answer Comment  Room service appropriate? Yes   Fluid consistency: Thin       09/07/23 1952           Family Communication:  Yes discussed with sister at bedside   Consults:  [facility]   Admission status:   Observation, Telemetry bed  Severity of Illness: The appropriate patient status for this patient is OBSERVATION. Observation status is judged to be reasonable and necessary in order to provide the required intensity of service to ensure the patient's safety. The patient's presenting symptoms, physical exam findings, and initial radiographic and laboratory data in the context of their medical condition is felt to place them at decreased risk for further clinical deterioration. Furthermore, it is anticipated that the patient will be medically stable for discharge from the hospital within 2 midnights of admission.    Arnulfo Larch, MD [facility]  How to contact the [facility] Attending or Consulting provider 7A - 7P or covering provider during after hours 7P -7A, for this patient.  Check the care team in [facility] and look for  a) attending/consulting [facility] provider listed and b) the [facility] team listed Log into [email or web address] and use [facility] universal password to access. If you do not have the password, please contact the hospital operator. Locate the [facility] provider you are looking for under [facility] and page to a number that you can be directly reached. If you still have difficulty reaching the provider, please page the [facility] (Director on Call) for the Hospitalists listed on amion for assistance.  09/07/2023, 8:19 PM

## 2023-09-07 NOTE — Plan of Care (Signed)
  Problem: Activity: Goal: Risk for activity intolerance will decrease Outcome: Adequate for Discharge   

## 2023-09-07 NOTE — ED Provider Notes (Signed)
 [place] EMERGENCY DEPARTMENT AT [facility] Provider Note   CSN: [id number] Arrival date & time: 09/07/23  1347     History  Chief Complaint  Patient presents with   Rectal Bleeding    [name] [name] is a 73 y.o. female.   Rectal Bleeding    Patient has a history of hypertension hyperlipidemia TIA, bradycardia.  Patient is actually scheduled to have a pacemaker next week.  Patient states she came to the ED today because of rectal bleeding.  Patient states she noticed that she had to go to the bathroom this morning.  When she first went she had a normal bowel movement.  Shortly thereafter she had to go again.  Patient had a third episode and when she went the third time it was just bright red blood.  She has not had any pain.  She denies any fevers or lightheadedness.  Home Medications Prior to Admission medications   Medication Sig Start Date End Date Taking? Authorizing Provider  acetaminophen  (TYLENOL ) 325 MG tablet Take 650 mg by mouth every 6 (six) hours as needed for moderate pain (pain score 4-6).    [provider]  aspirin  81 MG chewable tablet Chew 81 mg by mouth daily.    [provider]  carbamazepine  (TEGRETOL ) 200 MG tablet TAKE 1 AND 1/2 TABLETS BY MOUTH TWO TIMES A DAY 01/24/23   Wess Hammed, NP  cholecalciferol (VITAMIN D3) 25 MCG (1000 UT) tablet Take 1,000 Units by mouth daily.    [provider]  escitalopram  (LEXAPRO ) 10 MG tablet TAKE 1 TABLET(10 MG) BY MOUTH AT BEDTIME 08/20/23   Elvira Hammersmith, MD  ezetimibe  (ZETIA ) 10 MG tablet TAKE 1 TABLET(10 MG) BY MOUTH DAILY 04/05/23   Wendie Hamburg, MD  levETIRAcetam  (KEPPRA ) 750 MG tablet TAKE 2 TABLETS(1500 MG) BY MOUTH TWICE DAILY 10/28/22   Wess Hammed, NP  lisinopril -hydrochlorothiazide  (ZESTORETIC ) 20-25 MG tablet TAKE 1 TABLET BY MOUTH DAILY 08/20/23   Elvira Hammersmith, MD  Melatonin 5 MG CHEW Chew 10 mg by mouth at bedtime as needed (sleep).     [provider]  Multiple Vitamins-Minerals (MULTIVITAMIN PO) Take 1 tablet by mouth daily.    [provider]  nitroGLYCERIN  (NITROSTAT ) 0.4 MG SL tablet Place 1 tablet (0.4 mg total) under the tongue every 5 (five) minutes as needed for chest pain. 12/21/21   Wendie Hamburg, MD  rosuvastatin  (CRESTOR ) 40 MG tablet TAKE 1 TABLET(40 MG) BY MOUTH DAILY 12/27/22   Wendie Hamburg, MD      Allergies    Zetia  [ezetimibe ]    Review of Systems   Review of Systems  Gastrointestinal:  Positive for hematochezia.    Physical Exam Updated Vital Signs BP 94/61   Pulse (!) 48   Temp 97.9 F (36.6 C)   Resp (!) 22   SpO2 97%  Physical Exam Vitals and nursing note reviewed.  Constitutional:      General: She is not in acute distress.    Appearance: She is well-developed.  HENT:     Head: Normocephalic and atraumatic.     Right Ear: External ear normal.     Left Ear: External ear normal.  Eyes:     General: No scleral icterus.       Right eye: No discharge.        Left eye: No discharge.     Conjunctiva/sclera: Conjunctivae normal.  Neck:     Trachea: No tracheal deviation.  Cardiovascular:     Rate and Rhythm: Normal rate and regular rhythm.  Pulmonary:     Effort: Pulmonary effort is normal. No respiratory distress.     Breath sounds: Normal breath sounds. No stridor. No wheezing or rales.  Abdominal:     General: Bowel sounds are normal. There is no distension.     Palpations: Abdomen is soft.     Tenderness: There is no abdominal tenderness. There is no guarding or rebound.  Genitourinary:    Comments: No mass appreciated, blood-tinged mucus noted on rectal exam, no large clots appreciated Musculoskeletal:        General: No tenderness or deformity.     Cervical back: Neck supple.  Skin:    General: Skin is warm and dry.     Findings: No rash.  Neurological:     General: No focal deficit present.     Mental Status: She is alert.      Cranial Nerves: No cranial nerve deficit, dysarthria or facial asymmetry.     Sensory: No sensory deficit.     Motor: No abnormal muscle tone or seizure activity.     Coordination: Coordination normal.  Psychiatric:        Mood and Affect: Mood normal.     ED Results / Procedures / Treatments   Labs (all labs ordered are listed, but only abnormal results are displayed) Labs Reviewed  CBC WITH DIFFERENTIAL/PLATELET - Abnormal; Notable for the following components:      Result Value   WBC 3.6 (*)    Hemoglobin 10.1 (*)    HCT 32.5 (*)    All other components within normal limits  OCCULT BLOOD X 1 CARD TO LAB, STOOL - Abnormal; Notable for the following components:   Fecal Occult Bld POSITIVE (*)    All other components within normal limits  PROTIME-INR  COMPREHENSIVE METABOLIC PANEL WITH GFR    EKG None  Radiology No results found.  Procedures Procedures    Medications Ordered in ED Medications  sodium chloride  0.9 % bolus 500 mL (has no administration in time range)    ED Course/ Medical Decision Making/ A&P                                 Medical Decision Making Problems Addressed: Gastrointestinal hemorrhage associated with anorectal source: acute illness or injury that poses a threat to life or bodily functions  Amount and/or Complexity of Data Reviewed Labs: ordered.   Patient presented to the ED for evaluation of GI bleeding.  Patient also has history of bradycardia and is scheduled for a pacemaker next week.  Patient not feeling lightheaded and dizzy.  Will continue to monitor closely.  SHe does present with acute GI bleeding.  Patient will need to be admitted to the hospital for further treatment of her gi bleeding.  Labs are currently pending.  Care turned over to Dr. Tamela Fake at shift change        Final Clinical Impression(s) / ED Diagnoses Final diagnoses:  Gastrointestinal hemorrhage associated with anorectal source    Rx / DC  Orders ED Discharge Orders     None         Trish Furl, MD 09/07/23 1554

## 2023-09-07 NOTE — ED Provider Notes (Signed)
  Physical Exam  BP 94/61   Pulse (!) 48   Temp 97.9 F (36.6 C)   Resp (!) 22   SpO2 97%   Physical Exam  Procedures  Procedures  ED Course / MDM     Received care of patient from Dr. Monnie Anthony.  Please see his note for prior history, physical and care.  Presents with rectal bleeding, initially stool x2 then an episode of rectal bleeding.  No pain, no lightheadedness.  Nontender exam.  Plan for admission.  Awaiting labs.  Not on anticoagulation.  Scheduled for pacemaker. Dr. Elvin [facility] [place].  Labs completed and personally evaluated and interpreted by me show Cr stable at baseline, Hgb similar to prior 10-11 range.  BP fluctuating, asymptomatic at this time without further episodes of bleeding in the ED.   Will admit with concern for lower GI bleed.  Discussed with [facility] [place].      Scarlette Currier, MD 09/07/23 1650

## 2023-09-07 NOTE — ED Notes (Signed)
[name] [name] with cl called for transport

## 2023-09-07 NOTE — ED Notes (Signed)
 Spoke with lab about occult card order now in place.

## 2023-09-08 ENCOUNTER — Observation Stay (HOSPITAL_COMMUNITY)

## 2023-09-08 ENCOUNTER — Encounter (HOSPITAL_COMMUNITY): Payer: Self-pay | Admitting: Internal Medicine

## 2023-09-08 DIAGNOSIS — I440 Atrioventricular block, first degree: Secondary | ICD-10-CM | POA: Diagnosis not present

## 2023-09-08 DIAGNOSIS — Q630 Accessory kidney: Secondary | ICD-10-CM | POA: Diagnosis not present

## 2023-09-08 DIAGNOSIS — N1831 Chronic kidney disease, stage 3a: Secondary | ICD-10-CM | POA: Diagnosis not present

## 2023-09-08 DIAGNOSIS — R001 Bradycardia, unspecified: Secondary | ICD-10-CM | POA: Diagnosis not present

## 2023-09-08 DIAGNOSIS — K625 Hemorrhage of anus and rectum: Secondary | ICD-10-CM | POA: Diagnosis not present

## 2023-09-08 DIAGNOSIS — Z01810 Encounter for preprocedural cardiovascular examination: Secondary | ICD-10-CM | POA: Diagnosis not present

## 2023-09-08 DIAGNOSIS — N183 Chronic kidney disease, stage 3 unspecified: Secondary | ICD-10-CM | POA: Insufficient documentation

## 2023-09-08 DIAGNOSIS — I700 Atherosclerosis of aorta: Secondary | ICD-10-CM | POA: Diagnosis not present

## 2023-09-08 DIAGNOSIS — E119 Type 2 diabetes mellitus without complications: Secondary | ICD-10-CM

## 2023-09-08 LAB — HEMOGLOBIN AND HEMATOCRIT, BLOOD
HCT: 32.7 % — ABNORMAL LOW (ref 36.0–46.0)
HCT: 33.1 % — ABNORMAL LOW (ref 36.0–46.0)
HCT: 31.4 % — ABNORMAL LOW (ref 36.0–46.0)
Hemoglobin: 10.4 g/dL — ABNORMAL LOW (ref 12.0–15.0)
Hemoglobin: 10.2 g/dL — ABNORMAL LOW (ref 12.0–15.0)
Hemoglobin: 10 g/dL — ABNORMAL LOW (ref 12.0–15.0)

## 2023-09-08 LAB — CBC
HCT: 31.3 % — ABNORMAL LOW (ref 36.0–46.0)
Hemoglobin: 9.7 g/dL — ABNORMAL LOW (ref 12.0–15.0)
MCH: 26.1 pg (ref 26.0–34.0)
MCHC: 31 g/dL (ref 30.0–36.0)
MCV: 84.4 fL (ref 80.0–100.0)
Platelets: 155 10*3/uL (ref 150–400)
RBC: 3.71 MIL/uL — ABNORMAL LOW (ref 3.87–5.11)
RDW: 13.2 % (ref 11.5–15.5)
WBC: 2.7 10*3/uL — ABNORMAL LOW (ref 4.0–10.5)
nRBC: 0 % (ref 0.0–0.2)

## 2023-09-08 LAB — BASIC METABOLIC PANEL WITH GFR
Anion gap: 6 (ref 5–15)
BUN: 22 mg/dL (ref 8–23)
CO2: 27 mmol/L (ref 22–32)
Calcium: 8.8 mg/dL — ABNORMAL LOW (ref 8.9–10.3)
Chloride: 106 mmol/L (ref 98–111)
Creatinine, Ser: 1.06 mg/dL — ABNORMAL HIGH (ref 0.44–1.00)
GFR, Estimated: 56 mL/min — ABNORMAL LOW (ref >60)
Glucose, Bld: 97 mg/dL (ref 70–99)
Potassium: 4 mmol/L (ref 3.5–5.1)
Sodium: 139 mmol/L (ref 135–145)

## 2023-09-08 LAB — PHOSPHORUS: Phosphorus: 3.4 mg/dL (ref 2.5–4.6)

## 2023-09-08 LAB — TYPE AND SCREEN
ABO/RH(D): O POS
Antibody Screen: NEGATIVE

## 2023-09-08 LAB — ABO/RH: ABO/RH(D): O POS

## 2023-09-08 LAB — MAGNESIUM: Magnesium: 1.9 mg/dL (ref 1.7–2.4)

## 2023-09-08 MED ORDER — IOHEXOL 350 MG/ML SOLN
75.0000 mL | Freq: Once | INTRAVENOUS | Status: AC | PRN
  Administered 2023-09-08: 75 mL via INTRAVENOUS

## 2023-09-08 NOTE — Telephone Encounter (Signed)
 Called and spoke to patient and patient verbalized that she was hospitalized yesterday. Patient wanted to make her providers aware that she is still in hospital and may have a colonoscopy tomorrow. Made patient aware this will be forward to provider.

## 2023-09-08 NOTE — Progress Notes (Signed)
 [facility] PROGRESS NOTE    Progress Note  [name] [name]  WJX:[id number] DOB: 03/25/1951 DOA: 09/07/2023 PCP: Elvira Hammersmith, MD     Brief Narrative:   [name] [name] is an 73 y.o. female past medical history of symptomatic bradycardia without upcoming pacemaker placement on 09/13/2023, history of seizure disorder, chronic disease stage III yea, diabetes mellitus type 2 transfer from the [facility] with rectal bleeding the patient relates started about 4 months prior to admission after period of constipation with rectal pain.  Assessment/Plan:   Rectal bleed/lower GI bleed: With intermittent dark stools that started about 4 months prior to admission. Hemoglobin on admission 9.7, previously 10-11.  He also has a mild leukopenia Hemoglobin is relatively stable. Currently n.p.o. hold aspirin  his last dose was yesterday. Awaiting GI further recommendations. Question of this is a mixed picture her history sounds more like hemorrhoidal bleed and she has a leukopenia for some time now question MDS.  Essential HTN (hypertension) Hold ACE inhibitor and hydrochlorothiazide  in the setting of GI bleed. Blood pressure seems to be well-controlled around 120/40 off antihypertensive medication.  Sinus bradycardia: Appears to be symptomatic as she is to for pacemaker placement on 09/13/2023. Holding aspirin . After GI workup we will notify EP.  TIA (transient ischemic attack) Hold aspirin  continue statins.  Seizure disorder (HCC) Continue home dose of Keppra  and carbamazepine .  CKD (chronic kidney disease) stage 3, GFR 30-59 ml/min (HCC) Creatinine appears to be at baseline.  Controlled type 2 diabetes mellitus without complication, without long-term current use of  insulin (HCC) Blood glucose around 90, last A1c a month ago was 6.5. Continue diet control. Patient is currently NPO.    DVT prophylaxis: SCD Family Communication:none Status is: Observation The patient  remains OBS appropriate and will d/c before 2 midnights.    Code Status:     Code Status Orders  (From admission, onward)           Start     Ordered   09/07/23 1951  Full code  Continuous       Question:  By:  Answer:  Other   09/07/23 1952           Code Status History     Date Active Date Inactive Code Status Order ID Comments User Context   01/29/2014 1638 02/09/2014 1424 Full Code [id number]  Jairo Mayer Inpatient   01/18/2014 2226 01/29/2014 1638 Full Code [id number]  Margaree Shark, MD Inpatient         IV Access:   Peripheral IV   Procedures and diagnostic studies:   No results found.   Medical Consultants:   None.   Subjective:    [name] [name] no further bloody bowel movements in house.  Objective:    Vitals:   09/07/23 2021 09/07/23 2258 09/07/23 2321 09/08/23 0316  BP: (!) 119/46  (!) 136/56 (!) 121/44  Pulse: (!) 6  (!) 56 60  Resp: 15 16 20 12   Temp: 97.8 F (36.6 C)   98.3 F (36.8 C)  TempSrc: Oral   Oral  SpO2: 99%  98% 98%   SpO2: 98 % FiO2 (%): 21 %   Intake/Output Summary (Last 24 hours) at 09/08/2023 0731 Last data filed at 09/07/2023 1740 Gross per 24 hour  Intake 500.08 ml  Output --  Net 500.08 ml   There were no vitals filed for this visit.  Exam: General exam: In no acute distress. Respiratory system: Good air movement and  clear to auscultation. Cardiovascular system: S1 & S2 heard, RRR. No JVD. Gastrointestinal system: Abdomen is nondistended, soft and nontender.  Extremities: No pedal edema. Skin: No rashes, lesions or ulcers Psychiatry: Judgement and insight appear normal. Mood & affect appropriate.    Data Reviewed:    Labs: Basic Metabolic Panel: Recent Labs  Lab 09/07/23 1511 09/08/23 0404  NA 141 139  K 3.8 4.0  CL 103 106  CO2 26 27  GLUCOSE 90 97  BUN 31* 22  CREATININE 1.07* 1.06*  CALCIUM  9.3 8.8*  MG  --  1.9  PHOS  --  3.4   GFR CrCl cannot be calculated  (Unknown ideal weight.). Liver Function Tests: Recent Labs  Lab 09/07/23 1511  AST 17  ALT 14  ALKPHOS 109  BILITOT <0.2  PROT 6.0*  ALBUMIN 3.9   No results for input(s): "LIPASE", "AMYLASE" in the last 168 hours. No results for input(s): "AMMONIA" in the last 168 hours. Coagulation profile Recent Labs  Lab 09/07/23 1511  INR 0.9   COVID-19 Labs  No results for input(s): "DDIMER", "FERRITIN", "LDH", "CRP" in the last 72 hours.  No results found for: "SARSCOV2NAA"  CBC: Recent Labs  Lab 09/07/23 1511 09/08/23 0404  WBC 3.6* 2.7*  NEUTROABS 2.4  --   HGB 10.1* 9.7*  HCT 32.5* 31.3*  MCV 84.0 84.4  PLT 189 155   Cardiac Enzymes: No results for input(s): "CKTOTAL", "CKMB", "CKMBINDEX", "TROPONINI" in the last 168 hours. BNP (last 3 results) No results for input(s): "PROBNP" in the last 8760 hours. CBG: No results for input(s): "GLUCAP" in the last 168 hours. D-Dimer: No results for input(s): "DDIMER" in the last 72 hours. Hgb A1c: No results for input(s): "HGBA1C" in the last 72 hours. Lipid Profile: No results for input(s): "CHOL", "HDL", "LDLCALC", "TRIG", "CHOLHDL", "LDLDIRECT" in the last 72 hours. Thyroid  function studies: No results for input(s): "TSH", "T4TOTAL", "T3FREE", "THYROIDAB" in the last 72 hours.  Invalid input(s): "FREET3" Anemia work up: No results for input(s): "VITAMINB12", "FOLATE", "FERRITIN", "TIBC", "IRON", "RETICCTPCT" in the last 72 hours. Sepsis Labs: Recent Labs  Lab 09/07/23 1511 09/08/23 0404  WBC 3.6* 2.7*   Microbiology No results found for this or any previous visit (from the past 240 hours).   Medications:    carbamazepine   300 mg Oral BID   escitalopram   10 mg Oral QHS   levETIRAcetam   1,500 mg Oral BID   rosuvastatin   40 mg Oral Daily   sodium chloride  flush  3 mL Intravenous Q12H   Continuous Infusions:    LOS: 0 days   Macdonald Savoy  [facility]  09/08/2023, 7:31 AM

## 2023-09-08 NOTE — Plan of Care (Signed)
  Problem: Activity: Goal: Risk for activity intolerance will decrease 09/08/2023 0043 by Ned Balint, RN Outcome: Adequate for Discharge 09/07/2023 2317 by Ned Balint, RN Outcome: Adequate for Discharge

## 2023-09-08 NOTE — Consult Note (Addendum)
 Attending physician's note   I have taken a history, reviewed the chart, and examined the patient. I performed a substantive portion of this encounter, including complete performance of at least one of the key components, in conjunction with the APP. I agree with the APP's note, impression, and recommendations with my edits.  73 year old female with medical history as outlined below, to include history of symptomatic bradycardia and was scheduled for pacemaker placement early next week, presenting with BRBPR.  She reports having episode of BRBPR about 4-5 months ago which had not recurred until yesterday.  No associated abdominal pain or UGI symptoms.  Admission evaluation notable for the following: - H/H 9.7/31.3 --> 10.4/32.7 (baseline Hgb ~10-11) - WBC 2.7 (stable from previous) - BUN/creatinine 31/1.1 --> 22/1.06 - INR 0.9 - Heme positive stool - CT angiography performed this afternoon due to recurrent hematochezia and was negative for extravasation.  GI tract was otherwise normal-appearing without any areas of inflammation, obstruction, mass effect.  1) Hematochezia Episode of painless BRBPR 4-5 months ago, then recurrence yesterday and again this morning.  H/H relatively stable from her baseline and slightly up trended since hospital admission.  CTA negative for active bleeding. - Continue serial CBC checks with blood products as needed per protocol - Given symptomatic bradycardia with plan for pacemaker placement next week, since she is currently hemodynamically stable with stable H/H, I am more inclined to wait on performing any nonemergent colonoscopy on this patient at elevated risk.  Bradycardia is not uncommon during colonoscopy, particularly in an elderly female, and therefore would reserve colonoscopy to emergent only if she were having active, significant rebleeding until pacemaker can be placed.  Discussed with inpatient consulting Cardiology service via secure chat  2)  Bradycardia - Management per consulting Cardiology service  3) CKD 3 4) Diabetes 5) OSA 6) History of TIA - Management per primary Hospital service  GI service will continue to follow  Harry Lindau, DO, FACG [phone] office         Consultation  Referring Provider: [facility] Primary Care Physician:  Elvira Hammersmith, MD Primary Gastroenterologist:  Dr. Elvin Hammer  Reason for Consultation: Rectal bleeding  HPI: [name] [name] is a 73 y.o. female, who presented to the emergency room yesterday at [facility] with complaints of rectal bleeding.  She says she had 2 normal bowel movements yesterday, then had a third bowel movement which appeared to be grossly bloody.  She said after that she continued to have some oozing or dripping from the rectum.  She says it is very unusual for her to have more than 1 bowel movement per day.  She has not had any associated abdominal pain or cramping, no rectal pain. She says that she had 1 episode of noting a bloody bowel movement 4 to 5 months ago with just 1 episode which resolved and had not recurred.  She has been paying attention to her stools since then and says she has been having consistently brown stool but has noted small red spots that she thought were blood in with the bowel movements since then.  She has been a little bit more constipated usually going every couple of days. Appetite has been fine, weight has been stable.  She says she has some short-term memory issues and had forgotten to mention her GI symptoms when she was seen by her PCP recently. She is not on any anticoagulation, does take baby aspirin  daily. She had colonoscopy in 2017 per Dr. Elvin Hammer  which was negative.  Other medical problems include seizure disorder, chronic kidney disease stage IIIa, diabetes mellitus, and has been undergoing workup with cardiology for symptomatic bradycardia and is actually scheduled to have pacemaker placed next week on  09/13/2023.  Labs on arrival INR 0.9 BUN 31/creatinine 1.07 WBC 3.6/hemoglobin 10.1/hematocrit 32.5/MCV 84/platelets 189 Potassium 3.8/BUN 31/creatinine 1.07 LFTs within normal limits  Hemoglobin today down to 9.7/hematocrit 31.3 Hemoglobin 1 month ago was 11.3/hematocrit 35.0, hemoglobin 8 months ago 10/hematocrit 31.9 Hemoglobin August 2024 10/hematocrit 31.9    Past Medical History:  Diagnosis Date   Anxiety    Cataract    Chronic insomnia 10/13/2016   Depression    Gait disorder 03/14/2014   HTN (hypertension)    Hx of cardiovascular stress test    Lexiscan  Myoview (1/16):  No ischemia, EF 68%, breast atten; Low Risk   Hyperlipemia    Hyperlipidemia    Phreesia 07/18/2020   Hypertension    Phreesia 07/18/2020   Memory difficulty 03/14/2014   Meningitis due to bacteria 01-18-14   OSA (obstructive sleep apnea) 11/30/2016   Seizures (HCC)    Sleep apnea    Phreesia 07/18/2020   TIA (transient ischemic attack)     Past Surgical History:  Procedure Laterality Date   NO PAST SURGERIES      Prior to Admission medications   Medication Sig Start Date End Date Taking? Authorizing Provider  aspirin  81 MG chewable tablet Chew 81 mg by mouth daily.   Yes [provider]  acetaminophen  (TYLENOL ) 325 MG tablet Take 650 mg by mouth every 6 (six) hours as needed for moderate pain (pain score 4-6).    [provider]  carbamazepine  (TEGRETOL ) 200 MG tablet TAKE 1 AND 1/2 TABLETS BY MOUTH TWO TIMES A DAY Patient taking differently: Take 350 mg by mouth in the morning and at bedtime. 01/24/23   Wess Hammed, NP  cholecalciferol (VITAMIN D3) 25 MCG (1000 UT) tablet Take 1,000 Units by mouth daily.    [provider]  escitalopram  (LEXAPRO ) 10 MG tablet TAKE 1 TABLET(10 MG) BY MOUTH AT BEDTIME Patient taking differently: Take 10 mg by mouth at bedtime. 08/20/23   Elvira Hammersmith, MD  ezetimibe  (ZETIA ) 10 MG tablet TAKE 1 TABLET(10 MG) BY MOUTH  DAILY Patient taking differently: Take 10 mg by mouth daily. 04/05/23   Wendie Hamburg, MD  levETIRAcetam  (KEPPRA ) 750 MG tablet TAKE 2 TABLETS(1500 MG) BY MOUTH TWICE DAILY Patient taking differently: Take 1,500 mg by mouth 2 (two) times daily. 10/28/22   Wess Hammed, NP  lisinopril -hydrochlorothiazide  (ZESTORETIC ) 20-25 MG tablet TAKE 1 TABLET BY MOUTH DAILY 08/20/23   Elvira Hammersmith, MD  Melatonin 5 MG CHEW Chew 10 mg by mouth at bedtime as needed (sleep).    [provider]  Multiple Vitamins-Minerals (MULTIVITAMIN PO) Take 1 tablet by mouth daily.    [provider]  nitroGLYCERIN  (NITROSTAT ) 0.4 MG SL tablet Place 1 tablet (0.4 mg total) under the tongue every 5 (five) minutes as needed for chest pain. 12/21/21   Wendie Hamburg, MD  rosuvastatin  (CRESTOR ) 40 MG tablet TAKE 1 TABLET(40 MG) BY MOUTH DAILY Patient taking differently: Take 40 mg by mouth daily. 12/27/22   Wendie Hamburg, MD    Current Facility-Administered Medications  Medication Dose Route Frequency Provider Last Rate Last Admin   acetaminophen  (TYLENOL ) tablet 1,000 mg  1,000 mg Oral Q6H PRN Arnulfo Larch, MD       carbamazepine  (TEGRETOL ) tablet  300 mg  300 mg Oral BID Arnulfo Larch, MD   300 mg at 09/07/23 2202   escitalopram  (LEXAPRO ) tablet 10 mg  10 mg Oral QHS Arnulfo Larch, MD   10 mg at 09/07/23 2137   levETIRAcetam  (KEPPRA ) tablet 1,500 mg  1,500 mg Oral BID Segars, Jonathan, MD   1,500 mg at 09/07/23 2137   melatonin tablet 6 mg  6 mg Oral QHS PRN Arnulfo Larch, MD       ondansetron (ZOFRAN) injection 4 mg  4 mg Intravenous Q6H PRN Arnulfo Larch, MD       rosuvastatin  (CRESTOR ) tablet 40 mg  40 mg Oral Daily Segars, Arlyce Lambert, MD       sodium chloride  flush (NS) 0.9 % injection 3 mL  3 mL Intravenous Q12H Arnulfo Larch, MD   3 mL at 09/07/23 2137    Allergies as of 09/07/2023 - Review Complete 09/07/2023  Allergen Reaction Noted   Zetia   [ezetimibe ] Palpitations 04/29/2022    Family History  Problem Relation Age of Onset   Heart attack Mother 31   Stroke Mother    Hypertension Mother    Heart attack Father 73   Cerebral aneurysm Sister    Hypertension Sister    Kidney disease Sister        ESRD; HD in last year   Hypertension Brother    Hyperlipidemia Brother    Rheum arthritis Sister    Arthritis Sister    Hypertension Brother    Hyperlipidemia Brother    Hyperlipidemia Brother    Hypertension Brother     Social History   Socioeconomic History   Marital status: Single    Spouse name: Not on file   Number of children: 1   Years of education: college   Highest education level: Some college, no degree  Occupational History   Occupation: Retired Financial planner: [employer]  Tobacco Use   Smoking status: Former    Current packs/day: 0.00    Average packs/day: 0.5 packs/day for 40.0 years (20.0 ttl pk-yrs)    Types: Cigarettes    Start date: 07/09/1971    Quit date: 07/09/2011    Years since quitting: 12.1   Smokeless tobacco: Never  Vaping Use   Vaping status: Never Used  Substance and Sexual Activity   Alcohol use: Not Currently    Comment: Rare   Drug use: No   Sexual activity: Never    Birth control/protection: Abstinence  Other Topics Concern   Not on file  Social History Narrative   Marital status: divorced x 35 years; not dating; not interested in 2019.      Children: 1 child/son (44); 2 grandchildren; [place]      Lives:  Alone in [place] in [place].      Employment: retired 2008; Administrator, arts.      Tobacco: quit smoking in 2013.  Smoked x many years       Alcohol: none; holidays only      Exercise: none; quit exercising two years ago in 2015.        Seatbelt: 100%; no texting. Does not drive in 4098 due to seizure activity.       ADLs: no driving in 1191-4782 due to seizure activity; independent with ADLs.  Has cane for long distances      Advanced Directives: none;  [name]/sister.  FULL CODE no prolonged measures.         Patient is single lives at home alone,  has 1 child   Patient is right handed   Education level is 1 year of college   Caffeine consumption is 2 cups daily   Social Drivers of Health   Financial Resource Strain: Low Risk  (08/03/2023)   Overall Financial Resource Strain (CARDIA)    Difficulty of Paying Living Expenses: Not hard at all  Food Insecurity: No Food Insecurity (09/08/2023)   Hunger Vital Sign    Worried About Running Out of Food in the Last Year: Never true    Ran Out of Food in the Last Year: Never true  Transportation Needs: No Transportation Needs (09/08/2023)   PRAPARE - Administrator, Civil Service (Medical): No    Lack of Transportation (Non-Medical): No  Physical Activity: Inactive (08/03/2023)   Exercise Vital Sign    Days of Exercise per Week: 0 days    Minutes of Exercise per Session: 0 min  Stress: No Stress Concern Present (08/03/2023)   [employer] of Occupational Health - Occupational Stress Questionnaire    Feeling of Stress : Only a little  Social Connections: Unknown (09/08/2023)   Social Connection and Isolation Panel [NHANES]    Frequency of Communication with Friends and Family: Three times a week    Frequency of Social Gatherings with Friends and Family: Three times a week    Attends Religious Services: 1 to 4 times per year    Active Member of Clubs or Organizations: Yes    Attends Banker Meetings: More than 4 times per year    Marital Status: Patient declined  Intimate Partner Violence: Not At Risk (09/08/2023)   Humiliation, Afraid, Rape, and Kick questionnaire    Fear of Current or Ex-Partner: No    Emotionally Abused: No    Physically Abused: No    Sexually Abused: No    Review of Systems: Pertinent positive and negative review of systems were noted in the above HPI section.  All other review of systems was otherwise negative.   Physical Exam: Vital  signs in last 24 hours: Temp:  [97.8 F (36.6 C)-98.3 F (36.8 C)] 97.9 F (36.6 C) (05/08 0948) Pulse Rate:  [6-60] 60 (05/08 0316) Resp:  [11-24] 24 (05/08 0948) BP: (94-138)/(39-79) 128/79 (05/08 0948) SpO2:  [91 %-100 %] 99 % (05/08 0948) FiO2 (%):  [21 %] 21 % (05/07 2258) Last BM Date : 09/07/23 General:   Alert,  Well-developed, well-nourished, older female pleasant and cooperative in NAD, sitting up in the chair Head:  Normocephalic and atraumatic. Eyes:  Sclera clear, no icterus.   Conjunctiva pink. Ears:  Normal auditory acuity. Nose:  No deformity, discharge,  or lesions. Mouth:  No deformity or lesions.   Neck:  Supple; no masses or thyromegaly. Lungs:  Clear throughout to auscultation.   No wheezes, crackles, or rhonchi.  Heart:  Regular rate and rhythm; heart rate in the high 40s, no murmurs, clicks, rubs,  or gallops. Abdomen:  Soft, obese, nontender, BS active,nonpalp mass or hsm.   Rectal: Not done done per ER MD yesterday, no mass appreciated, blunted mucus on rectal exam no large clots appreciated Msk:  Symmetrical without gross deformities. . Pulses:  Normal pulses noted. Extremities:  Without clubbing or edema. Neurologic:  Alert and  oriented x4;  grossly normal neurologically. Skin:  Intact without significant lesions or rashes.. Psych:  Alert and cooperative. Normal mood and affect.  Intake/Output from previous day: 05/07 0701 - 05/08 0700 In: 500.1 [IV Piggyback:500.1] Out: -  Intake/Output this shift: No intake/output data recorded.  Lab Results: Recent Labs    09/07/23 1511 09/08/23 0404  WBC 3.6* 2.7*  HGB 10.1* 9.7*  HCT 32.5* 31.3*  PLT 189 155   BMET Recent Labs    09/07/23 1511 09/08/23 0404  NA 141 139  K 3.8 4.0  CL 103 106  CO2 26 27  GLUCOSE 90 97  BUN 31* 22  CREATININE 1.07* 1.06*  CALCIUM  9.3 8.8*   LFT Recent Labs    09/07/23 1511  PROT 6.0*  ALBUMIN 3.9  AST 17  ALT 14  ALKPHOS 109  BILITOT <0.2    PT/INR Recent Labs    09/07/23 1511  LABPROT 12.7  INR 0.9   Hepatitis Panel No results for input(s): "HEPBSAG", "HCVAB", "HEPAIGM", "HEPBIGM" in the last 72 hours.    IMPRESSION:  #82 73 year old female who presented to the emergency room yesterday with complaints of a bloody bowel movement, then oozing or dripping of blood afterward. She reports having brown stool with spots of blood in her stools over the past several months and then had 1 episode of more overt bloody bowel movement 4 to 5 months ago.  She has been hemodynamically stable, hemoglobin 1 month ago was 11.3, 10 yesterday on presentation and hemoglobin 9.7 today  Reviewing her labs she has a chronic anemia present back at least through 2023 likely secondary to chronic kidney disease.  Last colonoscopy 2017 was negative  Etiology of her bleeding is not entirely clear but is definitely low-grade and has been subacute.  Rule out secondary to internal hemorrhoids, rule out occult colon lesion.  #2 symptomatic bradycardia, patient awaiting pacemaker placement next week on 09/13/2023 Rate currently high 40s to 50  #3 short-term memory issues #4 chronic kidney disease stage IIIa #5.  Diabetes mellitus #6.  Seizure disorder #7.  Sleep apnea #8.  Prior TIA  PLAN: Okay for regular diet today Patient needs cardiology consultation.  I do not think she needs to have a colonoscopy prior to undergoing pacemaker placement, and may be in her best interest to have the pacemaker placed, then undergo colonoscopy either inpatient or outpatient .  GI will follow with you and await cardiac recommendations today Continue to trend hemoglobin   Amy EsterwoodPA-C  09/08/2023, 10:01 AM

## 2023-09-08 NOTE — Plan of Care (Signed)
  Problem: Activity: Goal: Risk for activity intolerance will decrease Outcome: Adequate for Discharge   

## 2023-09-08 NOTE — Consult Note (Addendum)
 Cardiology Consultation   Patient ID: [name] [name] MRN: [id number]; DOB: 1950-07-28  Admit date: 09/07/2023 Date of Consult: 09/08/2023  PCP:  Elvira Hammersmith, MD   [facility] Providers Cardiologist:  Wendie Hamburg, MD   {  Patient Profile:   [name] [name] is a 73 y.o. female with a hx of HTN, HLD, OSA, history of TIA, CKD stage 3a, sinus bradycardia awaiting PPM placement who is being seen 09/08/2023 for the evaluation of pre-op eval at the request of Dr. Sheree Dieter.  History of Present Illness:   [name] presented to [facility] ED with complaints of rectal bleeding. She reported rectal bleeding that began 4-5 months ago after a period of constipation and passing a large BM. Since then she has had intermittent dark stools with bright red blood as well. Her hemoglobin was stable, a month prior was 11.3 and noted to be 9.7 on presentation to ED. She does seem to have a history of chronic anemia secondary to CKD.   GI has seen this patient and is wanting cardiology's input in regards to timing of colonoscopy. They noted that they could perform this inpatient or outpatient if the decision was made to wait until after PPM placement for bradycardia.   After speaking with the patient, she agrees with the history as stated above. She says that she has been noticing some darker stools for around 4-5 months but she came to the ED this time because she had an acute event of bright red blood that led to her being concerned. She denies any lightheadedness, fatigue, dizziness, syncope or pre-syncope. She denies any anal pain.   She is agreeable to either option; outpatient colonoscopy post PPM or inpatient prior to getting her PPM. She would prefer to get the colonoscopy out of the way before if possible but is open to either plan.   Her baseline hemoglobin appears to be around 11. It was 10.1 on presentation and has been stable since admission. She has not required  any blood transfusions and she does not report any symptoms associated with acute blood loss at this time.   Her HR when I was in the room talking with her remained around 50-60s while she was resting in bed. On review of her vitals it appears that he stays around that range, sometimes dropping to the 30-40s. Her telemetry does note some brief ~2 second pauses but again the patient is asymptomatic at this time.   Past Medical History:  Diagnosis Date   Anxiety    Cataract    Chronic insomnia 10/13/2016   Depression    Gait disorder 03/14/2014   HTN (hypertension)    Hx of cardiovascular stress test    Lexiscan  Myoview (1/16):  No ischemia, EF 68%, breast atten; Low Risk   Hyperlipidemia    Phreesia 07/18/2020   Hypertension    Phreesia 07/18/2020   Memory difficulty 03/14/2014   Meningitis due to bacteria 01/18/2014   OSA (obstructive sleep apnea) 11/30/2016   Seizures (HCC)    Sleep apnea    Phreesia 07/18/2020   TIA (transient ischemic attack)    Past Surgical History:  Procedure Laterality Date   NO PAST SURGERIES      Home Medications:  Prior to Admission medications   Medication Sig Start Date End Date Taking? Authorizing Provider  acetaminophen  (TYLENOL ) 325 MG tablet Take 650 mg by mouth at bedtime as needed for moderate pain (pain score 4-6).   Yes [provider]  carbamazepine  (TEGRETOL ) 200 MG tablet TAKE 1 AND 1/2 TABLETS BY MOUTH TWO TIMES A DAY Patient taking differently: No sig reported 01/24/23  Yes Wess Hammed, NP  Cholecalciferol (VITAMIN D -3 PO) Take 1 capsule by mouth daily after supper.   Yes [provider]  escitalopram  (LEXAPRO ) 10 MG tablet TAKE 1 TABLET(10 MG) BY MOUTH AT BEDTIME Patient taking differently: No sig reported 08/20/23  Yes Sagardia, Isidro Margo, MD  ezetimibe  (ZETIA ) 10 MG tablet TAKE 1 TABLET(10 MG) BY MOUTH DAILY Patient taking differently: Take 10 mg by mouth daily with supper. 04/05/23  Yes Wendie Hamburg,  MD  levETIRAcetam  (KEPPRA ) 750 MG tablet TAKE 2 TABLETS(1500 MG) BY MOUTH TWICE DAILY Patient taking differently: Take 1,500 mg by mouth 2 (two) times daily. 10/28/22  Yes Wess Hammed, NP  lisinopril -hydrochlorothiazide  (ZESTORETIC ) 20-25 MG tablet TAKE 1 TABLET BY MOUTH DAILY 08/20/23  Yes Sagardia, Isidro Margo, MD  MELATONIN PO Take 2 each by mouth at bedtime as needed (relaxation). OTC Melatonin Gummies   Yes [provider]  Multiple Vitamins-Minerals (MULTIVITAMIN WOMEN 50+) TABS Take 1 tablet by mouth daily after supper.   Yes [provider]  nitroGLYCERIN  (NITROSTAT ) 0.4 MG SL tablet Place 1 tablet (0.4 mg total) under the tongue every 5 (five) minutes as needed for chest pain. 12/21/21  Yes Wendie Hamburg, MD  rosuvastatin  (CRESTOR ) 40 MG tablet TAKE 1 TABLET(40 MG) BY MOUTH DAILY Patient taking differently: Take 40 mg by mouth daily with supper. 12/27/22  Yes Wendie Hamburg, MD  aspirin  81 MG chewable tablet Chew 81 mg by mouth daily. Patient not taking: Reported on 09/08/2023    [provider]   Inpatient Medications: Scheduled Meds:  carbamazepine   300 mg Oral BID   escitalopram   10 mg Oral QHS   levETIRAcetam   1,500 mg Oral BID   rosuvastatin   40 mg Oral Daily   sodium chloride  flush  3 mL Intravenous Q12H   Continuous Infusions:  PRN Meds: acetaminophen , melatonin, ondansetron  (ZOFRAN ) IV  Allergies:    Allergies  Allergen Reactions   Nsaids Other (See Comments)    CKD stage 3    Zetia  [Ezetimibe ] Palpitations    Pt currently taking 10mg  QD with no issues - still has occasional palpitations but was told by cardiology this medication was not likely the cause.   Social History:   Social History   Socioeconomic History   Marital status: Single    Spouse name: Not on file   Number of children: 1   Years of education: college   Highest education level: Some college, no degree  Occupational History   Occupation: Retired Radio broadcast assistant: [employer]  Tobacco Use   Smoking status: Former    Current packs/day: 0.00    Average packs/day: 0.5 packs/day for 40.0 years (20.0 ttl pk-yrs)    Types: Cigarettes    Start date: 07/09/1971    Quit date: 07/09/2011    Years since quitting: 12.1   Smokeless tobacco: Never  Vaping Use   Vaping status: Never Used  Substance and Sexual Activity   Alcohol use: Not Currently    Comment: Rare   Drug use: No   Sexual activity: Never    Birth control/protection: Abstinence  Other Topics Concern   Not on file  Social History Narrative   Marital status: divorced x 35 years; not dating; not interested in 2019.      Children: 1 child/son (44); 2 grandchildren; [place]  Lives:  Alone in townhouse in [place].      Employment: retired 2008; Administrator, arts.      Tobacco: quit smoking in 2013.  Smoked x many years       Alcohol: none; holidays only      Exercise: none; quit exercising two years ago in 2015.        Seatbelt: 100%; no texting. Does not drive in 2536 due to seizure activity.       ADLs: no driving in 6440-3474 due to seizure activity; independent with ADLs.  Has cane for long distances      Advanced Directives: none; [name]/sister.  FULL CODE no prolonged measures.         Patient is single lives at home alone, has 1 child   Patient is right handed   Education level is 1 year of college   Caffeine consumption is 2 cups daily   Social Drivers of Health   Financial Resource Strain: Low Risk  (08/03/2023)   Overall Financial Resource Strain (CARDIA)    Difficulty of Paying Living Expenses: Not hard at all  Food Insecurity: No Food Insecurity (09/08/2023)   Hunger Vital Sign    Worried About Running Out of Food in the Last Year: Never true    Ran Out of Food in the Last Year: Never true  Transportation Needs: No Transportation Needs (09/08/2023)   PRAPARE - Administrator, Civil Service (Medical): No    Lack of Transportation  (Non-Medical): No  Physical Activity: Inactive (08/03/2023)   Exercise Vital Sign    Days of Exercise per Week: 0 days    Minutes of Exercise per Session: 0 min  Stress: No Stress Concern Present (08/03/2023)   [employer] of Occupational Health - Occupational Stress Questionnaire    Feeling of Stress : Only a little  Social Connections: Unknown (09/08/2023)   Social Connection and Isolation Panel [NHANES]    Frequency of Communication with Friends and Family: Three times a week    Frequency of Social Gatherings with Friends and Family: Three times a week    Attends Religious Services: 1 to 4 times per year    Active Member of Clubs or Organizations: Yes    Attends Banker Meetings: More than 4 times per year    Marital Status: Patient declined  Intimate Partner Violence: Not At Risk (09/08/2023)   Humiliation, Afraid, Rape, and Kick questionnaire    Fear of Current or Ex-Partner: No    Emotionally Abused: No    Physically Abused: No    Sexually Abused: No    Family History:   Family History  Problem Relation Age of Onset   Heart attack Mother 2   Stroke Mother    Hypertension Mother    Heart attack Father 43   Cerebral aneurysm Sister    Hypertension Sister    Kidney disease Sister        ESRD; HD in last year   Hypertension Brother    Hyperlipidemia Brother    Rheum arthritis Sister    Arthritis Sister    Hypertension Brother    Hyperlipidemia Brother    Hyperlipidemia Brother    Hypertension Brother     ROS:  Please see the history of present illness.  All other ROS reviewed and negative.     Physical Exam/Data:   Vitals:   09/08/23 0316 09/08/23 0948 09/08/23 1205 09/08/23 1547  BP: (!) 121/44 128/79 (!) 119/55 121/65  Pulse: 60  92 (!) 54  Resp: 12 (!) 24  12  Temp: 98.3 F (36.8 C) 97.9 F (36.6 C) 97.8 F (36.6 C) 97.8 F (36.6 C)  TempSrc: Oral Oral Oral Oral  SpO2: 98% 99% 99% 99%   Intake/Output Summary (Last 24 hours) at 09/08/2023  1554 Last data filed at 09/07/2023 1740 Gross per 24 hour  Intake 500.08 ml  Output --  Net 500.08 ml      08/04/2023   10:09 AM 07/29/2023    2:49 PM 06/28/2023    3:58 PM  Last 3 Weights  Weight (lbs) 251 lb 252 lb 12.8 oz 254 lb 12.8 oz  Weight (kg) 113.853 kg 114.669 kg 115.577 kg     There is no height or weight on file to calculate BMI.   General:  Well nourished, well developed, in no acute distress, on room air  HEENT: normal Neck: no JVD Vascular: Distal pulses 2+ bilaterally Cardiac:  normal S1, S2; bradycardic; no murmur  Lungs:  clear to auscultation bilaterally, no wheezing, rhonchi or rales  Abd: soft, nontender  Ext: no edema Musculoskeletal:  No deformities Skin: warm and dry  Neuro:  no focal abnormalities noted Psych:  Normal affect   EKG:  The EKG was personally reviewed and demonstrates:  sinus rhythm, HR 60, 1st degree AVB   Telemetry:  Telemetry was personally reviewed and demonstrates:  sinus bradycardia, HR 40-60s, some brief ~2 second pauses   Relevant CV Studies: Echocardiogram, 07/26/2023 IMPRESSIONS  Left ventricular ejection fraction, by estimation, is 65 to 70% . Left ventricular ejection fraction by 3D volume is 68 % . The left ventricle has normal function. The left ventricle has no regional wall motion abnormalities. Left ventricular diastolic parameters are indeterminate. The average left ventricular global longitudinal strain is - 22. 8 % . The global longitudinal strain is normal.  Right ventricular systolic function is mildly reduced. The right ventricular size is moderately enlarged. There is normal pulmonary artery systolic pressure. The estimated right ventricular systolic pressure is 26. 6 mmHg.  The mitral valve is normal in structure. Mild mitral valve regurgitation. No evidence of mitral stenosis.  The aortic valve is normal in structure. Aortic valve regurgitation is not visualized. No aortic stenosis is present.  The inferior vena cava  is normal in size with greater than 50% respiratory variability, suggesting right atrial pressure of 3 mmHg.  Laboratory Data:  High Sensitivity Troponin:  No results for input(s): "TROPONINIHS" in the last 720 hours.   Chemistry Recent Labs  Lab 09/07/23 1511 09/08/23 0404  NA 141 139  K 3.8 4.0  CL 103 106  CO2 26 27  GLUCOSE 90 97  BUN 31* 22  CREATININE 1.07* 1.06*  CALCIUM  9.3 8.8*  MG  --  1.9  GFRNONAA 55* 56*  ANIONGAP 11 6    Recent Labs  Lab 09/07/23 1511  PROT 6.0*  ALBUMIN 3.9  AST 17  ALT 14  ALKPHOS 109  BILITOT <0.2   Lipids No results for input(s): "CHOL", "TRIG", "HDL", "LABVLDL", "LDLCALC", "CHOLHDL" in the last 168 hours.  Hematology Recent Labs  Lab 09/07/23 1511 09/08/23 0404 09/08/23 1400  WBC 3.6* 2.7*  --   RBC 3.87 3.71*  --   HGB 10.1* 9.7* 10.4*  HCT 32.5* 31.3* 32.7*  MCV 84.0 84.4  --   MCH 26.1 26.1  --   MCHC 31.1 31.0  --   RDW 13.4 13.2  --   PLT 189 155  --  Thyroid  No results for input(s): "TSH", "FREET4" in the last 168 hours.  BNPNo results for input(s): "BNP", "PROBNP" in the last 168 hours.  DDimer No results for input(s): "DDIMER" in the last 168 hours.  Radiology/Studies:  CT ANGIO GI BLEED Result Date: 09/08/2023 CLINICAL DATA:  Lower GI bleed, rectal bleeding EXAM: CTA ABDOMEN AND PELVIS WITHOUT AND WITH CONTRAST TECHNIQUE: Multidetector CT imaging of the abdomen and pelvis was performed using the standard protocol during bolus administration of intravenous contrast. Multiplanar reconstructed images and MIPs were obtained and reviewed to evaluate the vascular anatomy. RADIATION DOSE REDUCTION: This exam was performed according to the departmental dose-optimization program which includes automated exposure control, adjustment of the mA and/or kV according to patient size and/or use of iterative reconstruction technique. CONTRAST:  75mL OMNIPAQUE  IOHEXOL  350 MG/ML SOLN COMPARISON:  12/30/2022 FINDINGS: VASCULAR Normal  contour and caliber of the abdominal aorta. No evidence of aneurysm, dissection, or other acute aortic pathology. Duplicated bilateral renal arteries. Replaced right hepatic artery arising from the superior mesenteric trunk. Moderate mixed aortic atherosclerosis. Review of the MIP images confirms the above findings. NON-VASCULAR Lower Chest: No acute findings. Hepatobiliary: No solid liver abnormality is seen. No gallstones, gallbladder wall thickening, or biliary dilatation. Pancreas: Unremarkable. No pancreatic ductal dilatation or surrounding inflammatory changes. Spleen: Normal in size without significant abnormality. Adrenals/Urinary Tract: Adrenal glands are unremarkable. Kidneys are normal, without renal calculi, solid lesion, or hydronephrosis. Bladder is unremarkable. Stomach/Bowel: Stomach is within normal limits. Appendix appears normal. No evidence of bowel wall thickening, distention, or inflammatory changes. No intraluminal contrast extravasation or other findings to specifically localize nidus of GI bleeding. Lymphatic: No enlarged abdominal or pelvic lymph nodes. Reproductive: Calcified uterine fibroid. Other: No abdominal wall hernia or abnormality. No ascites. Musculoskeletal: No acute osseous findings. IMPRESSION: 1. No intraluminal contrast extravasation or other findings to specifically localize nidus of GI bleeding. 2. Normal contour and caliber of the abdominal aorta. No evidence of aneurysm, dissection, or other acute aortic pathology. Moderate mixed aortic atherosclerosis. 3. Calcified uterine fibroid. Aortic Atherosclerosis (ICD10-I70.0). Electronically Signed   By: Fredricka Jenny M.D.   On: 09/08/2023 13:38  Assessment and Plan:   Sinus bradycardia 1st degree AV block Known history of sinus bradycardia, with fatigue and daytime somnolence  Noted to have rates down to the 30s but typically around 40-60s  Scheduled to get pacemaker placement 09/13/2023 with Dr. Arlester Ladd Current HR  50-60s while resting in room talking  Consult based on deciding timing in regards to colonoscopy by GI to evaluate rectal bleeding before or after PPM placement scheduled for 5/13  She is not currently experiencing any fatigue, dizziness, lightheadedness, syncope, pre-syncope  Baseline hemoglobin 10-11. This admission has ranged from 9.7-10.4  Has not required a blood transfusion this admission  Will discuss with MD but patient should be able to pursue inpatient colonoscopy this admission prior to having pacemaker placed  Per primary Rectal bleeding Hypertension History of TIA Seizure disorders CKD  Diabetes   For questions or updates, please contact [facility] Please consult [email or web address] for contact info under    Signed, Jiles Mote, PA-C  09/08/2023 4:39 PM  History and all data above reviewed.  Patient examined.  I agree with the findings as above.  She has symptomatic bradycardia with plans for elective pacemaker.  I reviewed her monitor placed in Nov and she had junctional bradycardia with no episodes of sustained pauses.  There was rare ectopy.  The patient denies any new symptoms  such as chest discomfort, neck or arm discomfort. There has been no new shortness of breath, PND or orthopnea. There have been no reported palpitations, presyncope or syncope.  She has fatigue but she does not sleep well.  She reports treated sleep apnea.   The patient exam reveals COR:RRR  ,  Lungs: Clear  ,  Abd: Positive bowel sounds, no rebound no guarding, Ext No edema  .  All available labs, radiology testing, previous records reviewed. Agree with documented assessment and plan.   Bradycardia:  We were asked to comment on whether she could have a colonoscopy prior to the pacemaker.  I don't think her bradycardia is a prohibitive risk.  She is likely to have transient bradycardia with sedation particularly given her sleep apnea.  However, I suspect that this could be managed and again not  an absolute contraindication to a the procedure and does not require urgent pacing before the procedure.  I discussed this with the patient and her sister.  Final decision on the timing of colonoscopy will be per the GI team.    Eilleen Grates  6:20 PM  09/08/2023  Addendum:  Please call us  with further questions.

## 2023-09-08 NOTE — Care Management Obs Status (Signed)
 MEDICARE OBSERVATION STATUS NOTIFICATION   Patient Details  Name: [name] [name] MRN: [id number] Date of Birth: 11-29-1950   Medicare Observation Status Notification Given:  Yes    Felix Host 09/08/2023, 3:49 PM

## 2023-09-09 DIAGNOSIS — H269 Unspecified cataract: Secondary | ICD-10-CM | POA: Diagnosis present

## 2023-09-09 DIAGNOSIS — K625 Hemorrhage of anus and rectum: Secondary | ICD-10-CM | POA: Diagnosis not present

## 2023-09-09 DIAGNOSIS — K921 Melena: Secondary | ICD-10-CM | POA: Diagnosis present

## 2023-09-09 DIAGNOSIS — Z6841 Body Mass Index (BMI) 40.0 and over, adult: Secondary | ICD-10-CM | POA: Diagnosis not present

## 2023-09-09 DIAGNOSIS — E119 Type 2 diabetes mellitus without complications: Secondary | ICD-10-CM

## 2023-09-09 DIAGNOSIS — K649 Unspecified hemorrhoids: Secondary | ICD-10-CM | POA: Diagnosis present

## 2023-09-09 DIAGNOSIS — R001 Bradycardia, unspecified: Secondary | ICD-10-CM | POA: Diagnosis not present

## 2023-09-09 DIAGNOSIS — E785 Hyperlipidemia, unspecified: Secondary | ICD-10-CM | POA: Diagnosis present

## 2023-09-09 DIAGNOSIS — I10 Essential (primary) hypertension: Secondary | ICD-10-CM | POA: Diagnosis not present

## 2023-09-09 DIAGNOSIS — D72819 Decreased white blood cell count, unspecified: Secondary | ICD-10-CM | POA: Diagnosis present

## 2023-09-09 DIAGNOSIS — R2689 Other abnormalities of gait and mobility: Secondary | ICD-10-CM | POA: Diagnosis present

## 2023-09-09 DIAGNOSIS — F419 Anxiety disorder, unspecified: Secondary | ICD-10-CM | POA: Diagnosis present

## 2023-09-09 DIAGNOSIS — F5104 Psychophysiologic insomnia: Secondary | ICD-10-CM | POA: Diagnosis present

## 2023-09-09 DIAGNOSIS — N1832 Chronic kidney disease, stage 3b: Secondary | ICD-10-CM | POA: Diagnosis present

## 2023-09-09 DIAGNOSIS — F32A Depression, unspecified: Secondary | ICD-10-CM | POA: Diagnosis present

## 2023-09-09 DIAGNOSIS — G4733 Obstructive sleep apnea (adult) (pediatric): Secondary | ICD-10-CM | POA: Diagnosis present

## 2023-09-09 DIAGNOSIS — I69398 Other sequelae of cerebral infarction: Secondary | ICD-10-CM | POA: Diagnosis not present

## 2023-09-09 DIAGNOSIS — G40909 Epilepsy, unspecified, not intractable, without status epilepticus: Secondary | ICD-10-CM | POA: Diagnosis present

## 2023-09-09 DIAGNOSIS — Z8249 Family history of ischemic heart disease and other diseases of the circulatory system: Secondary | ICD-10-CM | POA: Diagnosis not present

## 2023-09-09 DIAGNOSIS — I440 Atrioventricular block, first degree: Secondary | ICD-10-CM | POA: Diagnosis present

## 2023-09-09 DIAGNOSIS — D631 Anemia in chronic kidney disease: Secondary | ICD-10-CM | POA: Diagnosis present

## 2023-09-09 DIAGNOSIS — E669 Obesity, unspecified: Secondary | ICD-10-CM | POA: Diagnosis present

## 2023-09-09 DIAGNOSIS — E1122 Type 2 diabetes mellitus with diabetic chronic kidney disease: Secondary | ICD-10-CM | POA: Diagnosis present

## 2023-09-09 DIAGNOSIS — I129 Hypertensive chronic kidney disease with stage 1 through stage 4 chronic kidney disease, or unspecified chronic kidney disease: Secondary | ICD-10-CM | POA: Diagnosis present

## 2023-09-09 DIAGNOSIS — I69311 Memory deficit following cerebral infarction: Secondary | ICD-10-CM | POA: Diagnosis not present

## 2023-09-09 DIAGNOSIS — K5900 Constipation, unspecified: Secondary | ICD-10-CM | POA: Diagnosis present

## 2023-09-09 DIAGNOSIS — Z79899 Other long term (current) drug therapy: Secondary | ICD-10-CM | POA: Diagnosis not present

## 2023-09-09 DIAGNOSIS — K922 Gastrointestinal hemorrhage, unspecified: Secondary | ICD-10-CM | POA: Diagnosis present

## 2023-09-09 DIAGNOSIS — N1831 Chronic kidney disease, stage 3a: Secondary | ICD-10-CM | POA: Diagnosis not present

## 2023-09-09 LAB — HEMOGLOBIN AND HEMATOCRIT, BLOOD
HCT: 31.8 % — ABNORMAL LOW (ref 36.0–46.0)
Hemoglobin: 9.9 g/dL — ABNORMAL LOW (ref 12.0–15.0)

## 2023-09-09 MED ORDER — LISINOPRIL-HYDROCHLOROTHIAZIDE 20-25 MG PO TABS: 1.0000 | ORAL_TABLET | Freq: Every day | ORAL | 1 refills | Status: AC

## 2023-09-09 NOTE — Progress Notes (Signed)
 Discharge instructions (including medications) discussed with and copy provided to patient/caregiver

## 2023-09-09 NOTE — Plan of Care (Signed)

## 2023-09-09 NOTE — Discharge Summary (Addendum)
 Physician Discharge Summary  [name] [name] WUJ:[id number] DOB: 1950/07/02 DOA: 09/07/2023  PCP: Elvira Hammersmith, MD  Admit date: 09/07/2023 Discharge date: 09/09/2023  Admitted From: home Disposition:  Home  Recommendations for Outpatient Follow-up:  Follow up with EP next week on 09/13/2023 for pacemaker placement Follow-up with GI as an outpatient for colonoscopy post pacemaker placement Please obtain BMP/CBC in one week PCP to refer to oncology for possible bone marrow biopsy when she has completed her workup for pacemaker placement and colonoscopy.   Home Health:No Equipment/Devices:None  Discharge Condition:Stable CODE STATUS:Full Diet recommendation: Heart Healthy  Brief/Interim Summary: 73 y.o. female past medical history of symptomatic bradycardia without upcoming pacemaker placement on 09/13/2023, history of seizure disorder, chronic disease stage III yea, diabetes mellitus type 2 transfer from the [facility] with rectal bleeding the patient relates started about 4 months prior to admission after period of constipation with rectal pain.   Discharge Diagnoses:  Principal Problem:   Rectal bleed Active Problems:   HTN (hypertension)   TIA (transient ischemic attack)   Seizure disorder (HCC)   Symptomatic bradycardia   CKD (chronic kidney disease) stage 3, GFR 30-59 ml/min (HCC)   Controlled type 2 diabetes mellitus without complication, without long-term current use of insulin (HCC)   Lower GI bleed  Rectal bleeding/lower GI bleed: With intermittent dark stools for about 4 months prior to admission hemoglobin on admission 9.7. She usually ranges around 10-11. GI was consulted recommended a CT angio of the abdomen pelvis that showed negative for bleed. Because of her history of bradycardia and scheduled for pacemaker placement 09/13/2018 5 GI held on colonoscopy which will be done post pacemaker placement. We held aspirin  he will ask EP post pacemaker placement if she  can resume her aspirin . I concerned that she is having hemorrhoidal bleeds and she has a mild leukopenia, that this anemia might be multifactorial question MDS PCP to refer her to oncologist for possible bone marrow biopsy post colonoscopy.  Essential hypertension: She can resume her antihypertensive medication in 2 days no changes made to her medication.  Sinus bradycardia: Appears to be asymptomatic, she is scheduled for pacemaker placement 09/13/2023 by EP.  History of TIA: Holding aspirin  continue statins.  History of seizure disorder: Continue Keppra  and carbamazepine .  Chronic kidney disease stage IIIb: Her creatinine appears to be at baseline.  Controlled diabetes mellitus type 2: With last A1c of 6.5. Continue current regimen and carb out of diet.    Discharge Instructions  Discharge Instructions     Diet - low sodium heart healthy   Complete by: As directed    Increase activity slowly   Complete by: As directed       Allergies as of 09/09/2023       Reactions   Nsaids Other (See Comments)   CKD stage 3    Zetia  [ezetimibe ] Palpitations   Pt currently taking 10mg  QD with no issues - still has occasional palpitations but was told by cardiology this medication was not likely the cause.        Medication List     PAUSE taking these medications    aspirin  81 MG chewable tablet Wait to take this until your doctor or other care provider tells you to start again. Chew 81 mg by mouth daily.       TAKE these medications    acetaminophen  325 MG tablet Commonly known as: TYLENOL  Take 650 mg by mouth at bedtime as needed for moderate pain (pain score 4-6).  carbamazepine  200 MG tablet Commonly known as: TEGRETOL  TAKE 1 AND 1/2 TABLETS BY MOUTH TWO TIMES A DAY What changed: additional instructions   escitalopram  10 MG tablet Commonly known as: LEXAPRO  TAKE 1 TABLET(10 MG) BY MOUTH AT BEDTIME What changed: See the new instructions.   ezetimibe  10 MG  tablet Commonly known as: ZETIA  TAKE 1 TABLET(10 MG) BY MOUTH DAILY What changed: See the new instructions.   levETIRAcetam  750 MG tablet Commonly known as: KEPPRA  TAKE 2 TABLETS(1500 MG) BY MOUTH TWICE DAILY What changed:  how much to take how to take this when to take this additional instructions   lisinopril -hydrochlorothiazide  20-25 MG tablet Commonly known as: ZESTORETIC  Take 1 tablet by mouth daily. Start taking on: Sep 11, 2023 What changed: These instructions start on Sep 11, 2023. If you are unsure what to do until then, ask your doctor or other care provider.   MELATONIN PO Take 2 each by mouth at bedtime as needed (relaxation). OTC Melatonin Gummies   Multivitamin Women 50+ Tabs Take 1 tablet by mouth daily after supper.   nitroGLYCERIN  0.4 MG SL tablet Commonly known as: NITROSTAT  Place 1 tablet (0.4 mg total) under the tongue every 5 (five) minutes as needed for chest pain.   rosuvastatin  40 MG tablet Commonly known as: CRESTOR  TAKE 1 TABLET(40 MG) BY MOUTH DAILY What changed: See the new instructions.   VITAMIN D -3 PO Take 1 capsule by mouth daily after supper.        Allergies  Allergen Reactions   Nsaids Other (See Comments)    CKD stage 3    Zetia  [Ezetimibe ] Palpitations    Pt currently taking 10mg  QD with no issues - still has occasional palpitations but was told by cardiology this medication was not likely the cause.    Consultations: Gastroenterology Cardiology   Procedures/Studies: CT ANGIO GI BLEED Result Date: 09/08/2023 CLINICAL DATA:  Lower GI bleed, rectal bleeding EXAM: CTA ABDOMEN AND PELVIS WITHOUT AND WITH CONTRAST TECHNIQUE: Multidetector CT imaging of the abdomen and pelvis was performed using the standard protocol during bolus administration of intravenous contrast. Multiplanar reconstructed images and MIPs were obtained and reviewed to evaluate the vascular anatomy. RADIATION DOSE REDUCTION: This exam was performed according  to the departmental dose-optimization program which includes automated exposure control, adjustment of the mA and/or kV according to patient size and/or use of iterative reconstruction technique. CONTRAST:  75mL OMNIPAQUE  IOHEXOL  350 MG/ML SOLN COMPARISON:  12/30/2022 FINDINGS: VASCULAR Normal contour and caliber of the abdominal aorta. No evidence of aneurysm, dissection, or other acute aortic pathology. Duplicated bilateral renal arteries. Replaced right hepatic artery arising from the superior mesenteric trunk. Moderate mixed aortic atherosclerosis. Review of the MIP images confirms the above findings. NON-VASCULAR Lower Chest: No acute findings. Hepatobiliary: No solid liver abnormality is seen. No gallstones, gallbladder wall thickening, or biliary dilatation. Pancreas: Unremarkable. No pancreatic ductal dilatation or surrounding inflammatory changes. Spleen: Normal in size without significant abnormality. Adrenals/Urinary Tract: Adrenal glands are unremarkable. Kidneys are normal, without renal calculi, solid lesion, or hydronephrosis. Bladder is unremarkable. Stomach/Bowel: Stomach is within normal limits. Appendix appears normal. No evidence of bowel wall thickening, distention, or inflammatory changes. No intraluminal contrast extravasation or other findings to specifically localize nidus of GI bleeding. Lymphatic: No enlarged abdominal or pelvic lymph nodes. Reproductive: Calcified uterine fibroid. Other: No abdominal wall hernia or abnormality. No ascites. Musculoskeletal: No acute osseous findings. IMPRESSION: 1. No intraluminal contrast extravasation or other findings to specifically localize nidus of GI bleeding. 2.  Normal contour and caliber of the abdominal aorta. No evidence of aneurysm, dissection, or other acute aortic pathology. Moderate mixed aortic atherosclerosis. 3. Calcified uterine fibroid. Aortic Atherosclerosis (ICD10-I70.0). Electronically Signed   By: Fredricka Jenny M.D.   On: 09/08/2023  13:38     Subjective: No complaint she feels great  Discharge Exam: Vitals:   09/09/23 0816 09/09/23 1221  BP: 128/72 (!) 115/53  Pulse: (!) 53 62  Resp: 19 13  Temp: 98 F (36.7 C) 97.7 F (36.5 C)  SpO2: 97% 100%   Vitals:   09/09/23 0336 09/09/23 0816 09/09/23 1221 09/09/23 1300  BP: 131/69 128/72 (!) 115/53   Pulse: (!) 59 (!) 53 62   Resp: 20 19 13    Temp: 97.9 F (36.6 C) 98 F (36.7 C) 97.7 F (36.5 C)   TempSrc: Oral Oral Oral   SpO2: 95% 97% 100%   Weight:    114.9 kg  Height:    5' 5.5" (1.664 m)    General: Pt is alert, awake, not in acute distress Cardiovascular: RRR, S1/S2 +, no rubs, no gallops Respiratory: CTA bilaterally, no wheezing, no rhonchi Abdominal: Soft, NT, ND, bowel sounds + Extremities: no edema, no cyanosis    The results of significant diagnostics from this hospitalization (including imaging, microbiology, ancillary and laboratory) are listed below for reference.     Microbiology: No results found for this or any previous visit (from the past 240 hours).   Labs: BNP (last 3 results) No results for input(s): "BNP" in the last 8760 hours. Basic Metabolic Panel: Recent Labs  Lab 09/07/23 1511 09/08/23 0404  NA 141 139  K 3.8 4.0  CL 103 106  CO2 26 27  GLUCOSE 90 97  BUN 31* 22  CREATININE 1.07* 1.06*  CALCIUM  9.3 8.8*  MG  --  1.9  PHOS  --  3.4   Liver Function Tests: Recent Labs  Lab 09/07/23 1511  AST 17  ALT 14  ALKPHOS 109  BILITOT <0.2  PROT 6.0*  ALBUMIN 3.9   No results for input(s): "LIPASE", "AMYLASE" in the last 168 hours. No results for input(s): "AMMONIA" in the last 168 hours. CBC: Recent Labs  Lab 09/07/23 1511 09/08/23 0404 09/08/23 1400 09/08/23 1846 09/08/23 2323 09/09/23 0958  WBC 3.6* 2.7*  --   --   --   --   NEUTROABS 2.4  --   --   --   --   --   HGB 10.1* 9.7* 10.4* 10.2* 10.0* 9.9*  HCT 32.5* 31.3* 32.7* 33.1* 31.4* 31.8*  MCV 84.0 84.4  --   --   --   --   PLT 189 155  --    --   --   --    Cardiac Enzymes: No results for input(s): "CKTOTAL", "CKMB", "CKMBINDEX", "TROPONINI" in the last 168 hours. BNP: Invalid input(s): "POCBNP" CBG: No results for input(s): "GLUCAP" in the last 168 hours. D-Dimer No results for input(s): "DDIMER" in the last 72 hours. Hgb A1c No results for input(s): "HGBA1C" in the last 72 hours. Lipid Profile No results for input(s): "CHOL", "HDL", "LDLCALC", "TRIG", "CHOLHDL", "LDLDIRECT" in the last 72 hours. Thyroid  function studies No results for input(s): "TSH", "T4TOTAL", "T3FREE", "THYROIDAB" in the last 72 hours.  Invalid input(s): "FREET3" Anemia work up No results for input(s): "VITAMINB12", "FOLATE", "FERRITIN", "TIBC", "IRON", "RETICCTPCT" in the last 72 hours. Urinalysis    Component Value Date/Time   COLORURINE YELLOW 01/18/2014 1632   APPEARANCEUR CLEAR  01/18/2014 1632   LABSPEC 1.016 01/18/2014 1632   PHURINE 6.5 01/18/2014 1632   GLUCOSEU NEGATIVE 01/18/2014 1632   HGBUR MODERATE (A) 01/18/2014 1632   BILIRUBINUR negative 05/23/2017 0929   BILIRUBINUR neg 10/17/2013 1327   KETONESUR negative 05/23/2017 0929   KETONESUR NEGATIVE 01/18/2014 1632   PROTEINUR negative 05/23/2017 0929   PROTEINUR 100 (A) 01/18/2014 1632   UROBILINOGEN 0.2 05/23/2017 0929   UROBILINOGEN 0.2 01/18/2014 1632   NITRITE Negative 05/23/2017 0929   NITRITE NEGATIVE 01/18/2014 1632   LEUKOCYTESUR Negative 05/23/2017 0929   Sepsis Labs Recent Labs  Lab 09/07/23 1511 09/08/23 0404  WBC 3.6* 2.7*   Microbiology No results found for this or any previous visit (from the past 240 hours).   SIGNED:   Macdonald Savoy, MD  [facility] 09/09/2023, 3:07 PM Pager   If 7PM-7AM, please contact night-coverage [email or web address] Password [email or web address]

## 2023-09-09 NOTE — Progress Notes (Signed)
 [facility] PROGRESS NOTE    Progress Note  [name]  ZOX:[id number] DOB: [birth year] DOA: 09/07/2023 PCP: Elvira Hammersmith, MD     Brief Narrative:   [name] [name] is an 73 y.o. female past medical history of symptomatic bradycardia without upcoming pacemaker placement on 09/13/2023, history of seizure disorder, chronic disease stage III yea, diabetes mellitus type 2 transfer from the [facility] with rectal bleeding the patient relates started about 4 months prior to admission after period of constipation with rectal pain.  Assessment/Plan:   Rectal bleed/lower GI bleed: With intermittent dark stools that started about 4 months prior to admission. Hemoglobin on admission 9.7, previously 10-11.  He also has a mild leukopenia Hemoglobin is relatively stable. GI was consulted they recommended a CT angio which was negative for bleed. There were thinking about doing a colonoscopy but due to her significant bradycardia and the need for pacemaker next week since her hemoglobin is stable they are more inclined to wait to perform a nonemergent colonoscopy if possible. So first pacemaker placement 09/13/2023 done GI will consider proceeding with colonoscopy. Continue to hold aspirin  Question of this is a mixed picture her history sounds more like hemorrhoidal bleed and she has a leukopenia for some time now question MDS.  Essential HTN (hypertension) Hold ACE inhibitor and hydrochlorothiazide  in the setting of GI bleed. Blood pressure seems to be controlled around 120/40 off antihypertensive medication.  Sinus bradycardia: Appears to be symptomatic. Cardiology recommended pacemaker placement on 09/13/2023  TIA (transient ischemic attack) Hold aspirin  continue statins.  Seizure disorder (HCC) Continue home dose of Keppra  and carbamazepine .  CKD (chronic kidney disease) stage 3, GFR 30-59 ml/min (HCC) Creatinine appears to be at baseline.  Controlled type 2 diabetes  mellitus without complication, without long-term current use of  insulin (HCC) Blood glucose around 90, last A1c a month ago was 6.5. Heart healthy diet.    DVT prophylaxis: SCD Family Communication:none Status is: Observation The patient remains OBS appropriate and will d/c before 2 midnights.    Code Status:     Code Status Orders  (From admission, onward)           Start     Ordered   09/07/23 1951  Full code  Continuous       Question:  By:  Answer:  Other   09/07/23 1952           Code Status History     Date Active Date Inactive Code Status Order ID Comments User Context   01/29/2014 1638 02/09/2014 1424 Full Code [id number]  Jairo Mayer Inpatient   01/18/2014 2226 01/29/2014 1638 Full Code [id number]  Margaree Shark, MD Inpatient         IV Access:   Peripheral IV   Procedures and diagnostic studies:   CT ANGIO GI BLEED Result Date: 09/08/2023 CLINICAL DATA:  Lower GI bleed, rectal bleeding EXAM: CTA ABDOMEN AND PELVIS WITHOUT AND WITH CONTRAST TECHNIQUE: Multidetector CT imaging of the abdomen and pelvis was performed using the standard protocol during bolus administration of intravenous contrast. Multiplanar reconstructed images and MIPs were obtained and reviewed to evaluate the vascular anatomy. RADIATION DOSE REDUCTION: This exam was performed according to the departmental dose-optimization program which includes automated exposure control, adjustment of the mA and/or kV according to patient size and/or use of iterative reconstruction technique. CONTRAST:  75mL OMNIPAQUE  IOHEXOL  350 MG/ML SOLN COMPARISON:  12/30/2022 FINDINGS: VASCULAR Normal contour and caliber of the abdominal aorta. No  evidence of aneurysm, dissection, or other acute aortic pathology. Duplicated bilateral renal arteries. Replaced right hepatic artery arising from the superior mesenteric trunk. Moderate mixed aortic atherosclerosis. Review of the MIP images confirms the above  findings. NON-VASCULAR Lower Chest: No acute findings. Hepatobiliary: No solid liver abnormality is seen. No gallstones, gallbladder wall thickening, or biliary dilatation. Pancreas: Unremarkable. No pancreatic ductal dilatation or surrounding inflammatory changes. Spleen: Normal in size without significant abnormality. Adrenals/Urinary Tract: Adrenal glands are unremarkable. Kidneys are normal, without renal calculi, solid lesion, or hydronephrosis. Bladder is unremarkable. Stomach/Bowel: Stomach is within normal limits. Appendix appears normal. No evidence of bowel wall thickening, distention, or inflammatory changes. No intraluminal contrast extravasation or other findings to specifically localize nidus of GI bleeding. Lymphatic: No enlarged abdominal or pelvic lymph nodes. Reproductive: Calcified uterine fibroid. Other: No abdominal wall hernia or abnormality. No ascites. Musculoskeletal: No acute osseous findings. IMPRESSION: 1. No intraluminal contrast extravasation or other findings to specifically localize nidus of GI bleeding. 2. Normal contour and caliber of the abdominal aorta. No evidence of aneurysm, dissection, or other acute aortic pathology. Moderate mixed aortic atherosclerosis. 3. Calcified uterine fibroid. Aortic Atherosclerosis (ICD10-I70.0). Electronically Signed   By: Fredricka Jenny M.D.   On: 09/08/2023 13:38     Medical Consultants:   None.   Subjective:    [name] no further bloody bowel movement  Objective:    Vitals:   09/08/23 2025 09/08/23 2328 09/09/23 0336 09/09/23 0816  BP: (!) 131/58 120/67 131/69 128/72  Pulse: 62 (!) 51 (!) 59 (!) 53  Resp: 19 20 20 19   Temp: 98.4 F (36.9 C) (!) 97.4 F (36.3 C) 97.9 F (36.6 C) 98 F (36.7 C)  TempSrc: Oral Oral Oral Oral  SpO2: 95% 98% 95% 97%   SpO2: 97 % FiO2 (%): 21 %   Intake/Output Summary (Last 24 hours) at 09/09/2023 1610 Last data filed at 09/08/2023 1837 Gross per 24 hour  Intake 360 ml   Output --  Net 360 ml   There were no vitals filed for this visit.  Exam: General exam: In no acute distress. Respiratory system: Good air movement and clear to auscultation. Cardiovascular system: S1 & S2 heard, RRR. No JVD. Gastrointestinal system: Abdomen is nondistended, soft and nontender.  Extremities: No pedal edema. Skin: No rashes, lesions or ulcers Psychiatry: Judgement and insight appear normal. Mood & affect appropriate.  Data Reviewed:    Labs: Basic Metabolic Panel: Recent Labs  Lab 09/07/23 1511 09/08/23 0404  NA 141 139  K 3.8 4.0  CL 103 106  CO2 26 27  GLUCOSE 90 97  BUN 31* 22  CREATININE 1.07* 1.06*  CALCIUM  9.3 8.8*  MG  --  1.9  PHOS  --  3.4   GFR CrCl cannot be calculated (Unknown ideal weight.). Liver Function Tests: Recent Labs  Lab 09/07/23 1511  AST 17  ALT 14  ALKPHOS 109  BILITOT <0.2  PROT 6.0*  ALBUMIN 3.9   No results for input(s): "LIPASE", "AMYLASE" in the last 168 hours. No results for input(s): "AMMONIA" in the last 168 hours. Coagulation profile Recent Labs  Lab 09/07/23 1511  INR 0.9   COVID-19 Labs  No results for input(s): "DDIMER", "FERRITIN", "LDH", "CRP" in the last 72 hours.  No results found for: "SARSCOV2NAA"  CBC: Recent Labs  Lab 09/07/23 1511 09/08/23 0404 09/08/23 1400 09/08/23 1846 09/08/23 2323  WBC 3.6* 2.7*  --   --   --   NEUTROABS 2.4  --   --   --   --  HGB 10.1* 9.7* 10.4* 10.2* 10.0*  HCT 32.5* 31.3* 32.7* 33.1* 31.4*  MCV 84.0 84.4  --   --   --   PLT 189 155  --   --   --    Cardiac Enzymes: No results for input(s): "CKTOTAL", "CKMB", "CKMBINDEX", "TROPONINI" in the last 168 hours. BNP (last 3 results) No results for input(s): "PROBNP" in the last 8760 hours. CBG: No results for input(s): "GLUCAP" in the last 168 hours. D-Dimer: No results for input(s): "DDIMER" in the last 72 hours. Hgb A1c: No results for input(s): "HGBA1C" in the last 72 hours. Lipid Profile: No  results for input(s): "CHOL", "HDL", "LDLCALC", "TRIG", "CHOLHDL", "LDLDIRECT" in the last 72 hours. Thyroid  function studies: No results for input(s): "TSH", "T4TOTAL", "T3FREE", "THYROIDAB" in the last 72 hours.  Invalid input(s): "FREET3" Anemia work up: No results for input(s): "VITAMINB12", "FOLATE", "FERRITIN", "TIBC", "IRON", "RETICCTPCT" in the last 72 hours. Sepsis Labs: Recent Labs  Lab 09/07/23 1511 09/08/23 0404  WBC 3.6* 2.7*   Microbiology No results found for this or any previous visit (from the past 240 hours).   Medications:    carbamazepine   300 mg Oral BID   escitalopram   10 mg Oral QHS   levETIRAcetam   1,500 mg Oral BID   rosuvastatin   40 mg Oral Daily   sodium chloride  flush  3 mL Intravenous Q12H   Continuous Infusions:    LOS: 0 days   Macdonald Savoy  [facility]  09/09/2023, 8:52 AM

## 2023-09-09 NOTE — Progress Notes (Addendum)
 Patient ID: [name] [name], female   DOB: 10-Nov-1950, 73 y.o.   MRN: [id number]     Attending physician's note   I have taken a history, reviewed the chart, and examined the patient. I performed a substantive portion of this encounter, including complete performance of at least one of the key components, in conjunction with the APP. I agree with the APP's note, impression, and recommendations with my edits.   Discussed options with patient at length today, to include 1) staying in hospital with plan for colonoscopy after pacemaker placement next week vs 2) discharge to home with expedited outpatient colonoscopy after pacemaker placement (scheduled for 5/13).  She is otherwise without any bleeding overnight and H/H stable at 10/31.  She would like to discharge home.  We discussed return precautions at length, and she understands. - Will send message to my outpatient team to arrange for expedited colonoscopy with me or Dr. Elvin Hammer to follow her scheduled pacemaker - Pacemaker placement on 5/13 as scheduled  [name] Zamorano, DO, FACG [phone] office          Progress Note   Subjective   Day # 2 CC; rectal bleeding  Patient has not had any further bowel movement or rectal bleeding since yesterday Hemoglobin 10/hematocrit 31.9> 9.9/31.8  Heart rate 52   Objective   Vital signs in last 24 hours: Temp:  [97.4 F (36.3 C)-98.4 F (36.9 C)] 97.7 F (36.5 C) (05/09 1221) Pulse Rate:  [51-62] 62 (05/09 1221) Resp:  [12-20] 13 (05/09 1221) BP: (115-131)/(53-72) 115/53 (05/09 1221) SpO2:  [95 %-100 %] 100 % (05/09 1221) FiO2 (%):  [21 %] 21 % (05/09 0027) Weight:  [114.9 kg] 114.9 kg (05/09 1300) Last BM Date : 09/08/23 General:   Elderly female in NAD Heart:  Regular rate and rhythm; no murmurs Lungs: Respirations even and unlabored, lungs CTA bilaterally Abdomen:  Soft, nontender and nondistended. Normal bowel sounds. Extremities:  Without edema. Neurologic:  Alert and  oriented,  grossly normal neurologically. Psych:  Cooperative. Normal mood and affect.  Intake/Output from previous day: 05/08 0701 - 05/09 0700 In: 360 [P.O.:360] Out: -  Intake/Output this shift: No intake/output data recorded.  Lab Results: Recent Labs    09/07/23 1511 09/08/23 0404 09/08/23 1400 09/08/23 1846 09/08/23 2323 09/09/23 0958  WBC 3.6* 2.7*  --   --   --   --   HGB 10.1* 9.7*   < > 10.2* 10.0* 9.9*  HCT 32.5* 31.3*   < > 33.1* 31.4* 31.8*  PLT 189 155  --   --   --   --    < > = values in this interval not displayed.   BMET Recent Labs    09/07/23 1511 09/08/23 0404  NA 141 139  K 3.8 4.0  CL 103 106  CO2 26 27  GLUCOSE 90 97  BUN 31* 22  CREATININE 1.07* 1.06*  CALCIUM  9.3 8.8*   LFT Recent Labs    09/07/23 1511  PROT 6.0*  ALBUMIN 3.9  AST 17  ALT 14  ALKPHOS 109  BILITOT <0.2   PT/INR Recent Labs    09/07/23 1511  LABPROT 12.7  INR 0.9    Studies/Results: CT ANGIO GI BLEED Result Date: 09/08/2023 CLINICAL DATA:  Lower GI bleed, rectal bleeding EXAM: CTA ABDOMEN AND PELVIS WITHOUT AND WITH CONTRAST TECHNIQUE: Multidetector CT imaging of the abdomen and pelvis was performed using the standard protocol during bolus administration of intravenous contrast. Multiplanar reconstructed images  and MIPs were obtained and reviewed to evaluate the vascular anatomy. RADIATION DOSE REDUCTION: This exam was performed according to the departmental dose-optimization program which includes automated exposure control, adjustment of the mA and/or kV according to patient size and/or use of iterative reconstruction technique. CONTRAST:  75mL OMNIPAQUE  IOHEXOL  350 MG/ML SOLN COMPARISON:  12/30/2022 FINDINGS: VASCULAR Normal contour and caliber of the abdominal aorta. No evidence of aneurysm, dissection, or other acute aortic pathology. Duplicated bilateral renal arteries. Replaced right hepatic artery arising from the superior mesenteric trunk. Moderate mixed  aortic atherosclerosis. Review of the MIP images confirms the above findings. NON-VASCULAR Lower Chest: No acute findings. Hepatobiliary: No solid liver abnormality is seen. No gallstones, gallbladder wall thickening, or biliary dilatation. Pancreas: Unremarkable. No pancreatic ductal dilatation or surrounding inflammatory changes. Spleen: Normal in size without significant abnormality. Adrenals/Urinary Tract: Adrenal glands are unremarkable. Kidneys are normal, without renal calculi, solid lesion, or hydronephrosis. Bladder is unremarkable. Stomach/Bowel: Stomach is within normal limits. Appendix appears normal. No evidence of bowel wall thickening, distention, or inflammatory changes. No intraluminal contrast extravasation or other findings to specifically localize nidus of GI bleeding. Lymphatic: No enlarged abdominal or pelvic lymph nodes. Reproductive: Calcified uterine fibroid. Other: No abdominal wall hernia or abnormality. No ascites. Musculoskeletal: No acute osseous findings. IMPRESSION: 1. No intraluminal contrast extravasation or other findings to specifically localize nidus of GI bleeding. 2. Normal contour and caliber of the abdominal aorta. No evidence of aneurysm, dissection, or other acute aortic pathology. Moderate mixed aortic atherosclerosis. 3. Calcified uterine fibroid. Aortic Atherosclerosis (ICD10-I70.0). Electronically Signed   By: Fredricka Jenny M.D.   On: 09/08/2023 13:38       Assessment / Plan:    #61 73 year old female who had presented to the ER with complaints of bright red blood per rectum/grossly bloody bowel movement.  She reports in retrospect she had been noticing what she thought were small spots of blood in her stools over the past several months and had 1 episode of more overtly bloody bowel movement 4 to 5 months ago.  She has been very stable since admission from a GI perspective, earlier yesterday she did have 2 small bloody bowel movements.  She was sent for stat  CTA which was negative and also did not show any abnormality in the colon or small bowel.  Her hemoglobin remained stable during that episode, she has not required transfusion and remains in the 10 range.  Colonoscopy 2017 was negative  Etiology of your bleeding is not entirely clear but at this point is clear it has been low-grade and subacute.  #2 symptomatic bradycardia-awaiting pacemaker placement next week on 09/13/2023. Heart rate 52 this morning Cardiology felt that she could be considered for colonoscopy and that her bradycardia was not an absolute contraindication.  #3 short-term memory issues #4 chronic kidney disease stage IIIa 5.  Diabetes mellitus 6.  Seizure disorder 7.  Sleep apnea 8.  Prior TIA  Plan; further discussion with patient today regarding options to stay in the hospital, observed for over the weekend, undergo pacemaker placement next week and then plan for bowel prep and colonoscopy thereafter versus going home this weekend, coming back next week for the planned pacemaker and planning for outpatient colonoscopy thereafter. She has been quite stable and we feel she is stable for discharge at this time.  She wishes to go home and come back for the pacemaker next week and would like to be scheduled for the colonoscopy as an outpatient.  She is  advised that should she have any ongoing issues with bleeding larger volume than what she has been doing or have any other symptoms suggestive of active bleeding with dizziness lightheadedness etc. that she certainly should come back to the hospital here. Okay for discharge today from GI standpoint Should have a repeat hemoglobin next week prior to her pacemaker We will arrange for outpatient colonoscopy with Dr. Elvin Hammer or Dr. Karene Oto within the next 2 weeks.    Principal Problem:   Rectal bleed Active Problems:   HTN (hypertension)   TIA (transient ischemic attack)   Seizure disorder (HCC)   Symptomatic bradycardia    CKD (chronic kidney disease) stage 3, GFR 30-59 ml/min (HCC)   Controlled type 2 diabetes mellitus without complication, without long-term current use of insulin (HCC)   Lower GI bleed     LOS: 0 days   Amy EsterwoodPA-C  09/09/2023, 2:16 PM

## 2023-09-12 ENCOUNTER — Telehealth: Payer: Self-pay

## 2023-09-12 ENCOUNTER — Other Ambulatory Visit: Payer: Self-pay

## 2023-09-12 DIAGNOSIS — D649 Anemia, unspecified: Secondary | ICD-10-CM

## 2023-09-12 DIAGNOSIS — K921 Melena: Secondary | ICD-10-CM

## 2023-09-12 MED ORDER — NA SULFATE-K SULFATE-MG SULF 17.5-3.13-1.6 GM/177ML PO SOLN: 1.0000 | ORAL | 0 refills | Status: DC

## 2023-09-12 NOTE — Transitions of Care (Post Inpatient/ED Visit) (Signed)
 09/12/2023  Name: [name] [name] MRN: [id number] DOB: Jul 28, 1950  Today's TOC FU Call Status: Today's TOC FU Call Status:: Successful TOC FU Call Completed TOC FU Call Complete Date: 09/12/23 Patient's Name and Date of Birth confirmed.  Transition Care Management Follow-up Telephone Call Date of Discharge: 09/09/23 Discharge Facility: [facility]) Type of Discharge: Inpatient Admission Primary Inpatient Discharge Diagnosis:: GI bleed How have you been since you were released from the hospital?: Better Any questions or concerns?: No  Items Reviewed: Did you receive and understand the discharge instructions provided?: Yes Medications obtained,verified, and reconciled?: Yes (Medications Reviewed) Any new allergies since your discharge?: No Dietary orders reviewed?: Yes Do you have support at home?: Yes People in Home [RPT]: sibling(s), child(ren), adult  Medications Reviewed Today: Medications Reviewed Today     Reviewed by Darrall Ellison, LPN (Licensed Practical Nurse) on 09/12/23 at 1303  Med List Status: <None>   Medication Order Taking? Sig Documenting Provider Last Dose Status Informant  acetaminophen  (TYLENOL ) 325 MG tablet [id number] No Take 650 mg by mouth at bedtime as needed for moderate pain (pain score 4-6). [provider] Past Week Active Self, Pharmacy Records  aspirin  81 MG chewable tablet [id number] No Chew 81 mg by mouth daily.  Patient not taking: Reported on 09/08/2023   [provider] Not Taking Active Self, Pharmacy Records           Med Note (COFFELL, Stan Eans   Thu Sep 08, 2023 11:27 AM) Pt states her last dose was AM 09/07/23.  carbamazepine  (TEGRETOL ) 200 MG tablet [id number] No TAKE 1 AND 1/2 TABLETS BY MOUTH TWO TIMES A DAY  Patient taking differently: No sig reported   Wess Hammed, NP 09/07/2023 Morning Active Self, Pharmacy Records           Med Note (COFFELL, Stan Eans   Thu Sep 08, 2023 11:28 AM) No fill history found on  dispense report or Dr. Anson Basta for this medication. Medication name/strength confirmed by pt - she insists compliance.   Cholecalciferol (VITAMIN D -3 PO) [id number] No Take 1 capsule by mouth daily after supper. [provider] Past Week Active Self, Pharmacy Records  escitalopram  (LEXAPRO ) 10 MG tablet [id number] No TAKE 1 TABLET(10 MG) BY MOUTH AT BEDTIME  Patient taking differently: No sig reported   Elvira Hammersmith, MD Past Week Active Self, Pharmacy Records  ezetimibe  (ZETIA ) 10 MG tablet [id number] No TAKE 1 TABLET(10 MG) BY MOUTH DAILY  Patient taking differently: Take 10 mg by mouth daily with supper.   Wendie Hamburg, MD Past Week Active Self, Pharmacy Records           Med Note (COFFELL, Stan Eans   Thu Sep 08, 2023 11:31 AM)    levETIRAcetam  (KEPPRA ) 750 MG tablet [id number] No TAKE 2 TABLETS(1500 MG) BY MOUTH TWICE DAILY  Patient taking differently: Take 1,500 mg by mouth 2 (two) times daily.   Wess Hammed, NP 09/07/2023 Morning Active Self, Pharmacy Records  lisinopril -hydrochlorothiazide  (ZESTORETIC ) 20-25 MG tablet [id number]  Take 1 tablet by mouth daily. Macdonald Savoy, MD  Active   MELATONIN PO [id number] No Take 2 each by mouth at bedtime as needed (relaxation). OTC Melatonin Gummies [provider] Past Month Active Self, Pharmacy Records  Multiple Vitamins-Minerals (MULTIVITAMIN WOMEN 50+) TABS [id number] No Take 1 tablet by mouth daily after supper. [provider] Past Week Active Self, Pharmacy Records  nitroGLYCERIN  (NITROSTAT ) 0.4 MG SL tablet [id number] No Place 1 tablet (0.4  mg total) under the tongue every 5 (five) minutes as needed for chest pain. Wendie Hamburg, MD Unknown Active Self, Pharmacy Records  rosuvastatin  (CRESTOR ) 40 MG tablet [id number] No TAKE 1 TABLET(40 MG) BY MOUTH DAILY  Patient taking differently: Take 40 mg by mouth daily with supper.   Wendie Hamburg, MD Past Week Active Self, Pharmacy Records             Home Care and Equipment/Supplies: Any new equipment or medical supplies ordered?: NA  Functional Questionnaire: Do you need assistance with bathing/showering or dressing?: No Do you need assistance with meal preparation?: No Do you need assistance with eating?: No Do you have difficulty maintaining continence: No Do you need assistance with getting out of bed/getting out of a chair/moving?: No Do you have difficulty managing or taking your medications?: No  Follow up appointments reviewed: PCP Follow-up appointment confirmed?: Yes Date of PCP follow-up appointment?: 09/19/23 Follow-up Provider: [facility] Follow-up appointment confirmed?: No Reason Specialist Follow-Up Not Confirmed: Patient has Specialist Provider Number and will Call for Appointment Do you need transportation to your follow-up appointment?: No Do you understand care options if your condition(s) worsen?: Yes-patient verbalized understanding    SIGNATURE Darrall Ellison, LPN [facility] Nurse Health Advisor Direct Dial [phone]

## 2023-09-12 NOTE — Pre-Procedure Instructions (Signed)
 Instructed patient on the following items: Arrival time 1230 Nothing to eat or drink after midnight No meds AM of procedure Responsible person to drive you home and stay with you for 24 hrs Wash with special soap night before and morning of procedure If on anti-coagulant drug instructions ASA-last dose 09/07/23

## 2023-09-12 NOTE — Telephone Encounter (Signed)
-----   Message from Annis Kinder sent at 09/10/2023 10:57 AM EDT ----- I should have 730 cases set up for add-ons.  With the new [facility] scheduling system, these may appear as 645 or 630 arrival times.  If not able to add for some reason, then yes, would plan to be done with Dr. Elvin Hammer and I'll send him a separate staff message to let him know.  Let me know what you are able to get done.  Thanks. ----- Message ----- From: Diantha Fossa, CMA Sent: 09/09/2023   4:35 PM EDT To: Annis Kinder, DO  Dr Karene Oto,  All of your 730 slots are booked.  You 1st available is 10-12-23 at 9am.  Dr Elvin Hammer has an opening on 09-23-23 and then his next opening is 10-20-23.  Should I just schedule the patient with Dr Elvin Hammer at the 5-23 spot? ----- Message ----- From: Annis Kinder, DO Sent: 09/09/2023   2:27 PM EDT To: Diantha Fossa, CMA  Can you please help me get this patient an appointment in the [facility] with me (she is a Technical sales engineer patient, but he is away).  She needs a colonoscopy, but is scheduled for pacemaker placement for symptomatic bradycardia on 5/13.  Colonoscopy can be after that.  I do not have any openings until June (cannot use 5/14), so would potentially need to use a 730 spot for me.  I am fine with booking into a 730 and this would be for colonoscopy for evaluation of hematochezia and anemia.  She should be discharging home today.  Thanks.

## 2023-09-12 NOTE — Telephone Encounter (Signed)
 Patient aware that she has been scheduled for colonoscopy with Dr Karene Oto on 09-23-23 at 730am.  Patient advised to arrive at 7am.  Suprep sent to pharmacy as requested.  Patient agreed to plan and verbalized understanding.  No further questions.

## 2023-09-13 ENCOUNTER — Ambulatory Visit (HOSPITAL_COMMUNITY)

## 2023-09-13 ENCOUNTER — Telehealth: Payer: Self-pay

## 2023-09-13 ENCOUNTER — Encounter: Payer: Self-pay | Admitting: Gastroenterology

## 2023-09-13 ENCOUNTER — Telehealth: Payer: Self-pay | Admitting: Gastroenterology

## 2023-09-13 ENCOUNTER — Ambulatory Visit (HOSPITAL_COMMUNITY)
Admission: RE | Admit: 2023-09-13 | Discharge: 2023-09-13 | Disposition: A | Discharge status: Home / Self Care | Attending: Cardiovascular Disease | Admitting: Cardiovascular Disease

## 2023-09-13 ENCOUNTER — Encounter (HOSPITAL_COMMUNITY)
Admission: RE | Disposition: A | Discharge status: Home / Self Care | Payer: Self-pay | Source: Home / Self Care | Attending: Cardiovascular Disease

## 2023-09-13 ENCOUNTER — Other Ambulatory Visit: Payer: Self-pay

## 2023-09-13 DIAGNOSIS — Z8673 Personal history of transient ischemic attack (TIA), and cerebral infarction without residual deficits: Secondary | ICD-10-CM | POA: Diagnosis not present

## 2023-09-13 DIAGNOSIS — I495 Sick sinus syndrome: Secondary | ICD-10-CM | POA: Insufficient documentation

## 2023-09-13 DIAGNOSIS — I10 Essential (primary) hypertension: Secondary | ICD-10-CM | POA: Diagnosis not present

## 2023-09-13 DIAGNOSIS — Z79899 Other long term (current) drug therapy: Secondary | ICD-10-CM | POA: Insufficient documentation

## 2023-09-13 DIAGNOSIS — E785 Hyperlipidemia, unspecified: Secondary | ICD-10-CM | POA: Diagnosis not present

## 2023-09-13 DIAGNOSIS — G4733 Obstructive sleep apnea (adult) (pediatric): Secondary | ICD-10-CM | POA: Insufficient documentation

## 2023-09-13 DIAGNOSIS — I517 Cardiomegaly: Secondary | ICD-10-CM | POA: Diagnosis not present

## 2023-09-13 DIAGNOSIS — Z950 Presence of cardiac pacemaker: Secondary | ICD-10-CM | POA: Diagnosis not present

## 2023-09-13 HISTORY — PX: PACEMAKER IMPLANT: EP1218

## 2023-09-13 LAB — GLUCOSE, CAPILLARY: Glucose-Capillary: 95 mg/dL (ref 70–99)

## 2023-09-13 SURGERY — PACEMAKER IMPLANT

## 2023-09-13 MED ORDER — FENTANYL CITRATE (PF) 100 MCG/2ML IJ SOLN
INTRAMUSCULAR | Status: AC
Start: 2023-09-13 — End: ?
  Filled 2023-09-13: qty 2

## 2023-09-13 MED ORDER — ASPIRIN 81 MG PO CHEW: 81.0000 mg | CHEWABLE_TABLET | Freq: Every day | ORAL | Status: AC

## 2023-09-13 MED ORDER — LIDOCAINE HCL (PF) 1 % IJ SOLN
INTRAMUSCULAR | Status: DC | PRN
  Administered 2023-09-13: 60 mL

## 2023-09-13 MED ORDER — HEPARIN (PORCINE) IN NACL 1000-0.9 UT/500ML-% IV SOLN
INTRAVENOUS | Status: DC | PRN
  Administered 2023-09-13: 500 mL

## 2023-09-13 MED ORDER — ONDANSETRON HCL 4 MG/2ML IJ SOLN: 4.0000 mg | Freq: Four times a day (QID) | INTRAMUSCULAR | Status: DC | PRN

## 2023-09-13 MED ORDER — MIDAZOLAM HCL 5 MG/5ML IJ SOLN
INTRAMUSCULAR | Status: DC | PRN
  Administered 2023-09-13 (×2): 1 mg via INTRAVENOUS

## 2023-09-13 MED ORDER — SODIUM CHLORIDE 0.9 % IV SOLN: 80.0000 mg | INTRAVENOUS | Status: AC

## 2023-09-13 MED ORDER — FENTANYL CITRATE (PF) 100 MCG/2ML IJ SOLN
INTRAMUSCULAR | Status: DC | PRN
  Administered 2023-09-13 (×2): 25 ug via INTRAVENOUS

## 2023-09-13 MED ORDER — CEFAZOLIN SODIUM-DEXTROSE 2-4 GM/100ML-% IV SOLN: 2.0000 g | INTRAVENOUS | Status: AC

## 2023-09-13 MED ORDER — POVIDONE-IODINE 10 % EX SWAB
2.0000 | Freq: Once | CUTANEOUS | Status: AC
  Administered 2023-09-13: 2 via TOPICAL

## 2023-09-13 MED ORDER — LIDOCAINE HCL (PF) 1 % IJ SOLN
INTRAMUSCULAR | Status: AC
  Filled 2023-09-13: qty 60

## 2023-09-13 MED ORDER — ACETAMINOPHEN 325 MG PO TABS: 325.0000 mg | ORAL_TABLET | ORAL | Status: DC | PRN

## 2023-09-13 MED ORDER — SODIUM CHLORIDE 0.9 % IV SOLN: INTRAVENOUS | Status: DC

## 2023-09-13 MED ORDER — SODIUM CHLORIDE 0.9 % IV SOLN
INTRAVENOUS | Status: AC
  Administered 2023-09-13: 80 mg
  Filled 2023-09-13: qty 2

## 2023-09-13 MED ORDER — MIDAZOLAM HCL 2 MG/2ML IJ SOLN
INTRAMUSCULAR | Status: AC
Start: 2023-09-13 — End: ?
  Filled 2023-09-13: qty 2

## 2023-09-13 MED ORDER — CEFAZOLIN SODIUM-DEXTROSE 2-4 GM/100ML-% IV SOLN
INTRAVENOUS | Status: AC
  Administered 2023-09-13: 2 g via INTRAVENOUS
  Filled 2023-09-13: qty 100

## 2023-09-13 SURGICAL SUPPLY — 13 items
CABLE SURGICAL S-101-97-12 (CABLE) ×1 IMPLANT
CATH CPS LOCATOR 3D MED XLNG (CATHETERS) IMPLANT
KIT MICROPUNCTURE NIT STIFF (SHEATH) IMPLANT
LEAD ULTIPACE 52 LPA1231/52 (Lead) IMPLANT
LEAD ULTIPACE 65 LPA1231/65 (Lead) IMPLANT
PACEMAKER ASSURITY DR-RF (Pacemaker) IMPLANT
PAD DEFIB RADIO PHYSIO CONN (PAD) ×1 IMPLANT
SHEATH 7FR PRELUDE SNAP 13 (SHEATH) IMPLANT
SHEATH 9FR PRELUDE SNAP 13 (SHEATH) IMPLANT
SLITTER AGILIS HISPRO (INSTRUMENTS) IMPLANT
TOOL HELIX LOCKING (MISCELLANEOUS) IMPLANT
TRAY PACEMAKER INSERTION (PACKS) ×1 IMPLANT
WIRE HI TORQ VERSACORE-J 145CM (WIRE) IMPLANT

## 2023-09-13 NOTE — Telephone Encounter (Signed)
 Patient's emergency contact, Jewelene Morton, came by and said she was contacted to change patient's appointment from the 23rd to the 15th.  She informed whoever called her she would let patient know and that patient would let us  know if she could change.  There was no confirmation that it was OK to move it to the 15th.  However, the appointment got moved and patient as well as emergency contact is livid, does not want the appointment on the 15th, and wants her appointment back on the 23rd.  Please contact patient regarding this, please.

## 2023-09-13 NOTE — Telephone Encounter (Signed)
 Not able to reach the patient by phone this morning, so I contacted her sister Gregary Lean (on [place]) to make her aware that we needed to change the appointment date and time of procedure.  Gregary Lean stated that her sister was not with her at the time of the call, but she would make her aware.  Gregary Lean had several questions regarding pacemaker placement on 09-13-23 and having the procedure on 09-15-23.  I advised Gregary Lean that she and the patient should speak with Cardiologist who is responsible for the pacemaker placement with any further questions.  Gregary Lean verbalized understanding.   Gregary Lean came by our office later during the day to pick up a copy of patient's instructions for procedure and spoke with Stana Ear at our office desk.  Please see Linda's comment as listed below."  "Patient's emergency contact, Jewelene Morton, came by and said she was contacted to change patient's appointment from the 23rd to the 15th.  She informed whoever called her she would let patient know and that patient would let us  know if she could change.  There was no confirmation that it was OK to move it to the 15th.  However, the appointment got moved and patient as well as emergency contact is livid, does not want the appointment on the 15th, and wants her appointment back on the 23rd.  Please contact patient regarding this, please."   Return call to the [facility] to further explain why the appointment was moved from 09-23-23 to 09-15-23. Gregary Lean wanted to move the appointment for the procedure back to 09-23-23. I explained to Gregary Lean that there was a scheduling conflict with Dr Andre Kawasaki schedule as well.   Gregary Lean stated that Cardiology does not wish for the patient to have colonoscopy that soon after the pacemaker was implanted due to swelling and the risk for infection. Gregary Lean advised that Dr Karene Oto would be made aware and I would return call to the patient after he has made a decision as to when the procedure can be performed.  Gregary Lean verbalized understanding and  thanked me for the call.

## 2023-09-13 NOTE — Telephone Encounter (Signed)
-----   Message from Annis Kinder sent at 09/13/2023  9:03 AM EDT ----- I see you scheduled this patient for 5/23 with me at 730.  Looks like somebody just canceled for 5/15 this week.  Think we can call her and get it changed to 5/15 instead and get this done sooner?  She is headed to cardiology this afternoon for her pacemaker, so would likely have to call her this morning so that she is aware that she would be doing clears tomorrow, bowel prep tomorrow night for procedure on 5/15 instead.  Let me know.  On a related note, I have something I need to get to before work on 5/23, so hopefully we can change her so I can still attend that (not that you had any way of knowing that, so my fault for not having said that previously).  Thanks.

## 2023-09-13 NOTE — Discharge Instructions (Addendum)
 After Your Cardiac Device  ACTIVITY Do not lift your arm above shoulder height for 1 week after your procedure. After 7 days, you may progress as below.  You should remove your sling 24 hours after your procedure, unless otherwise instructed by your provider.     Tuesday Sep 20, 2023  Wednesday Sep 21, 2023 Thursday Sep 22, 2023 Friday Sep 23, 2023   Do not lift, push, pull, or carry anything over 10 pounds with the affected arm until 6 weeks (Tuesday October 25, 2023 ) after your procedure.   You may drive AFTER your wound check, unless you have been told otherwise by your provider.   Ask your healthcare provider when you can go back to work   INCISION/Dressing  If large square, outer bandage is left in place, this can be removed after 24 hours from your procedure. Do not remove steri-strips or glue as below.   If a PRESSURE DRESSING (a bulky dressing that usually goes up over your shoulder) was applied or left in place, please follow instructions given by your provider on when to return to have this removed.   Monitor your cardiac device site for redness, swelling, and drainage. Call the device clinic at [phone] if you experience these symptoms or fever/chills.  If your incision is sealed with Steri-strips or staples, you may shower 7 days after your procedure or when told by your provider. Do not remove the steri-strips or let the shower hit directly on your site. You may wash around your site with soap and water.    If you were discharged in a sling, please do not wear this during the day more than 48 hours after your surgery unless otherwise instructed. This may increase the risk of stiffness and soreness in your shoulder.   Avoid lotions, ointments, or perfumes over your incision until it is well-healed.  You may use a hot tub or a pool AFTER your wound check appointment if the incision is completely closed.   DEVICE MANAGEMENT You should receive your ID card for your new  device in 4-8 weeks. Keep this card with you at all times once received. Consider wearing a medical alert bracelet or necklace.  Please follow manufacture instructions for keeping your device charged carefully. This should be performed every 2 weeks at the very least.   Your cardiac device may be MRI compatible. This will be discussed at your next office visit/wound check.  You should avoid contact with strong electric or magnetic fields.   Do not use amateur (ham) radio equipment or electric (arc) welding torches. MP3 player headphones with magnets should not be used. Some devices are safe to use if held at least 12 inches (30 cm) from your cardiac device. These include power tools, lawn mowers, and speakers. If you are unsure if something is safe to use, ask your health care provider.  When using your cell phone, hold it to the ear that is on the opposite side from the cardiac device. Do not leave your cell phone in a pocket over the cardiac device.  You may safely use electric blankets, heating pads, computers, and microwave ovens.  Call the office right away if: You have chest pain. You feel more short of breath than you have felt before. You feel more light-headed than you have felt before. Your incision starts to open up.  This information is not intended to replace advice given to you by your health care provider. Make sure you discuss any questions  you have with your health care provider.

## 2023-09-13 NOTE — H&P (Signed)
 Electrophysiology Office Note:    Date:  09/13/2023   ID:  [name] [name], DOB [birth year], MRN [id number]  PCP:  [name] Hammersmith, MD   [facility] Providers Cardiologist:  Wendie Hamburg, MD     Referring MD: No ref. provider found   History of Present Illness:    [name] [name] is a 73 y.o. female with a medical history significant for hypertension, hyperlipidemia, OSA, TIA, bacterial meningitis, seizures, referred for rhythm management.     She saw her primary cardiologist, Dr. Alda [name], in December 2023 with complaints of fatigue, shortness of breath.  She was noted to be in a junctional rhythm with rate in the 40s.  Carvedilol  was discontinued.  A follow-up ZIO monitor that month showed episodes of junctional rhythm with a minimum rate of 37 bpm and an average rate of 54 bpm.  She was again seen in February 2025 with complaints of fatigue and sinus bradycardia with a heart rate of 39 bpm despite having discontinued carvedilol .    She is here with her sister today.  The patient is significantly limited by sciatic pain.  She also sleeps poorly though she does attempt to use her CPAP as best as possible.  Due to insomnia, she has daytime sleepiness.  She concedes that her fatigue is multifactorial, and bradycardia may be playing 1 part.      Today, she is at baseline with moderate fatigue and using a cane to ambulate due to sciatic pain.  She has not had syncope, presyncope.  She presents today for placement of a dual chamber pacemaker. She was recently discharged from an admission for a GI bleed. Colonoscopy was not performed due to bradycardia during the admission, with heart rates frequently in the upper 30's and 40's. The colonoscopy will be deferred until after the pacemaker is in place.  EKGs/Labs/Other Studies Reviewed Today:     Echocardiogram:  TTE March 2025 EF 65 to 70%.  Mildly reduced right ventricular systolic dysfunction.  Otherwise  normal structure and function   Monitors:  7 day monitor October 2024-- my interpretation Sinus rhythm predominates.  Heart rate 37 to 108 bpm, average 54 bpm There were episodes of competing junctional rhythm with a rate of approximately 40 beats minute 1.7% atrial ectopy No symptoms were reported    EKG:         Physical Exam:    VS:  BP (!) 121/59   Pulse (!) 57   Temp 97.8 F (36.6 C) (Oral)   Resp 17   Ht 5\' 5"  (1.651 m)   Wt 114.8 kg   SpO2 94%   BMI 42.10 kg/m     Wt Readings from Last 3 Encounters:  09/13/23 114.8 kg  09/09/23 114.9 kg  08/04/23 113.9 kg     GEN: Well nourished, well developed in no acute distress CARDIAC: brady RR, no murmurs, rubs, gallops RESPIRATORY:  Normal work of breathing MUSCULOSKELETAL: no edema    ASSESSMENT & PLAN:     Sinus bradycardia With fatigue and daytime somnolence I suspect this is multifactorial with insomnia and deconditioning contributing However, I think that her bradycardia is also contributing, and it appears to be worsening progressively --with a heart rate of 39 during recent clinic visit despite having discontinued carvedilol  We discussed management options.  I told her that I did not think she needed a pacemaker urgently, but that I expected her bradycardia and fatigue to progress without a pacemaker. Using a shared decision-making technique,  we decided to proceed with pacemaker placement.  I discussed the indication for the procedure and the logistics, risks, potential benefit, and after care. I specifically explained that risks include but are not limited to infection, bleeding,damage to blood vessels, lung, and the heart -- but risk of prolonged hospitalization, need for surgery, or the event of stroke, heart attack, or death are low but not zero.    Obstructive sleep apnea Compliant with CPAP  GI bleed With the absence of a BM in the past 2 days, I have low suspicion for active bleeding   After  placement of the pacemaker, she will have strict restrictions for the use of her left arm.   Signed, Efraim Grange, MD  09/13/2023 1:53 PM    [facility]

## 2023-09-13 NOTE — Progress Notes (Signed)
 MD mealor notified of CXR results. Patient okay to discharge

## 2023-09-13 NOTE — Telephone Encounter (Signed)
 Duplicate message.  Please see telephone encounter from 09-13-23.

## 2023-09-14 ENCOUNTER — Encounter (HOSPITAL_COMMUNITY): Payer: Self-pay | Admitting: Cardiovascular Disease

## 2023-09-14 ENCOUNTER — Encounter: Payer: Self-pay | Admitting: Emergency Medicine

## 2023-09-14 MED FILL — Midazolam HCl Inj 2 MG/2ML (Base Equivalent): INTRAMUSCULAR | Qty: 2 | Status: AC

## 2023-09-14 NOTE — Telephone Encounter (Signed)
 Patient's sister is requesting a call back to discuss previous notes. Wished to cancel tomorrow's procedure due to not confirming appointment. Requesting a call back to discuss further. Please advise, thank you

## 2023-09-14 NOTE — Telephone Encounter (Signed)
 Spoke with patient and her sister to make them aware that we are waiting for clearance from cardiology.  Patient advised that we have rescheduled procedure to 09-22-23 arrival time at 7:00am.  We will contact patient once we get a response from cardiology.  Patient and her sister verbalized understanding.

## 2023-09-15 ENCOUNTER — Encounter: Admitting: Gastroenterology

## 2023-09-15 NOTE — Telephone Encounter (Signed)
 Phone call to [facility] to further discuss Dr Cirigliano's/Dr Mealor's recommendations as mentioned below:  Cirigliano, Laquetta Plank, DO  You; [place], [place] L, NP2 hours ago (11:37 AM)    Recommendations reviewed and currently scheduled for 5/22.  Unfortunately, patient will need to be laying on left side for colonoscopy.  If it is preferable to wait another week after that to allow for continued healing after pacemaker implant, we can do so.   Ava Boatman, NP  You; Annis Kinder, DO5 hours ago (8:33 AM)    Please see updated recommendation from Dr. Alroy Jericho, Augustus E, MD  You; [facility]; Cirigliano, Vito V, DO21 hours ago (5:01 PM)   AM I did not think it a good idea to have another procedure under sedation within 5 days of having a pacemaker placed. This is due mainly for management of the relatively fresh incision and leads. The current date mentioned (21st or 22nd?) will be fine. She shouldn't raise her left arm, and it would be better if she lies on her right side rather than her left side, if possible.    Gregary Lean stated that patient has a follow up appointment with Dr Arlester Ladd on 09-28-23, which was a date that was offered for colonoscopy. Appointment for colonoscopy was scheduled for 10-06-23 at 7am.  This will allow more time for pacemaker site to heal and patient to lie on left side during procedure.  New instructions sent to patient in MyChart.  Gregary Lean verbalized understanding and had no further questions.

## 2023-09-19 ENCOUNTER — Ambulatory Visit: Admitting: Emergency Medicine

## 2023-09-19 ENCOUNTER — Encounter: Payer: Self-pay | Admitting: Emergency Medicine

## 2023-09-19 VITALS — BP 112/78 | HR 62 | Temp 98.5°F | Ht 65.0 in | Wt 254.0 lb

## 2023-09-19 DIAGNOSIS — I10 Essential (primary) hypertension: Secondary | ICD-10-CM | POA: Diagnosis not present

## 2023-09-19 DIAGNOSIS — K922 Gastrointestinal hemorrhage, unspecified: Secondary | ICD-10-CM | POA: Diagnosis not present

## 2023-09-19 DIAGNOSIS — G40909 Epilepsy, unspecified, not intractable, without status epilepticus: Secondary | ICD-10-CM | POA: Diagnosis not present

## 2023-09-19 DIAGNOSIS — F411 Generalized anxiety disorder: Secondary | ICD-10-CM

## 2023-09-19 DIAGNOSIS — E785 Hyperlipidemia, unspecified: Secondary | ICD-10-CM

## 2023-09-19 DIAGNOSIS — G4733 Obstructive sleep apnea (adult) (pediatric): Secondary | ICD-10-CM

## 2023-09-19 DIAGNOSIS — F5104 Psychophysiologic insomnia: Secondary | ICD-10-CM

## 2023-09-19 DIAGNOSIS — Z09 Encounter for follow-up examination after completed treatment for conditions other than malignant neoplasm: Secondary | ICD-10-CM

## 2023-09-19 MED ORDER — ZOLPIDEM TARTRATE 5 MG PO TABS: 5.0000 mg | ORAL_TABLET | Freq: Every evening | ORAL | 1 refills | Status: DC | PRN

## 2023-09-19 NOTE — Assessment & Plan Note (Signed)
 Continue Lexapro 10 mg daily

## 2023-09-19 NOTE — Assessment & Plan Note (Signed)
Stable and well-controlled Continues carbamazepine and Keppra.  Medications managed by neurologist

## 2023-09-19 NOTE — Assessment & Plan Note (Signed)
 Diet and nutrition discussed.  Advised to decrease amount of daily carbohydrate intake and daily calories and increase amount of plant-based protein in her diet.

## 2023-09-19 NOTE — Assessment & Plan Note (Addendum)
 BP Readings from Last 3 Encounters:  09/19/23 112/78  09/13/23 (!) 143/63  09/09/23 (!) 115/53  Well-controlled hypertension. Continue Zestoretic  20-25 mg daily

## 2023-09-19 NOTE — Assessment & Plan Note (Signed)
 Chronic and affecting quality of life Over-the-counter sleep aids not helping Recommend Ambien  5 mg at bedtime

## 2023-09-19 NOTE — Assessment & Plan Note (Signed)
Diet and nutrition discussed.  Lipid profile done today. Continue rosuvastatin 40 mg daily.

## 2023-09-19 NOTE — Assessment & Plan Note (Signed)
 Stable and well-controlled. Bowel movement without blood yesterday and today Scheduled for colonoscopy early next month No concerns at this point.

## 2023-09-19 NOTE — Assessment & Plan Note (Signed)
Stable.  On CPAP treatment. 

## 2023-09-19 NOTE — Patient Instructions (Signed)
 Health Maintenance After Age 73 After age 4, you are at a higher risk for certain long-term diseases and infections as well as injuries from falls. Falls are a major cause of broken bones and head injuries in people who are older than age 47. Getting regular preventive care can help to keep you healthy and well. Preventive care includes getting regular testing and making lifestyle changes as recommended by your health care provider. Talk with your health care provider about: Which screenings and tests you should have. A screening is a test that checks for a disease when you have no symptoms. A diet and exercise plan that is right for you. What should I know about screenings and tests to prevent falls? Screening and testing are the best ways to find a health problem early. Early diagnosis and treatment give you the best chance of managing medical conditions that are common after age 37. Certain conditions and lifestyle choices may make you more likely to have a fall. Your health care provider may recommend: Regular vision checks. Poor vision and conditions such as cataracts can make you more likely to have a fall. If you wear glasses, make sure to get your prescription updated if your vision changes. Medicine review. Work with your health care provider to regularly review all of the medicines you are taking, including over-the-counter medicines. Ask your health care provider about any side effects that may make you more likely to have a fall. Tell your health care provider if any medicines that you take make you feel dizzy or sleepy. Strength and balance checks. Your health care provider may recommend certain tests to check your strength and balance while standing, walking, or changing positions. Foot health exam. Foot pain and numbness, as well as not wearing proper footwear, can make you more likely to have a fall. Screenings, including: Osteoporosis screening. Osteoporosis is a condition that causes  the bones to get weaker and break more easily. Blood pressure screening. Blood pressure changes and medicines to control blood pressure can make you feel dizzy. Depression screening. You may be more likely to have a fall if you have a fear of falling, feel depressed, or feel unable to do activities that you used to do. Alcohol use screening. Using too much alcohol can affect your balance and may make you more likely to have a fall. Follow these instructions at home: Lifestyle Do not drink alcohol if: Your health care provider tells you not to drink. If you drink alcohol: Limit how much you have to: 0-1 drink a day for women. 0-2 drinks a day for men. Know how much alcohol is in your drink. In the [place] one drink equals one 12 oz bottle of beer (355 mL), one 5 oz glass of wine (148 mL), or one 1 oz glass of hard liquor (44 mL). Do not use any products that contain nicotine or tobacco. These products include cigarettes, chewing tobacco, and vaping devices, such as e-cigarettes. If you need help quitting, ask your health care provider. Activity  Follow a regular exercise program to stay fit. This will help you maintain your balance. Ask your health care provider what types of exercise are appropriate for you. If you need a cane or walker, use it as recommended by your health care provider. Wear supportive shoes that have nonskid soles. Safety  Remove any tripping hazards, such as rugs, cords, and clutter. Install safety equipment such as grab bars in bathrooms and safety rails on stairs. Keep rooms and walkways  well-lit. General instructions Talk with your health care provider about your risks for falling. Tell your health care provider if: You fall. Be sure to tell your health care provider about all falls, even ones that seem minor. You feel dizzy, tiredness (fatigue), or off-balance. Take over-the-counter and prescription medicines only as told by your health care provider. These include  supplements. Eat a healthy diet and maintain a healthy weight. A healthy diet includes low-fat dairy products, low-fat (lean) meats, and fiber from whole grains, beans, and lots of fruits and vegetables. Stay current with your vaccines. Schedule regular health, dental, and eye exams. Summary Having a healthy lifestyle and getting preventive care can help to protect your health and wellness after age 11. Screening and testing are the best way to find a health problem early and help you avoid having a fall. Early diagnosis and treatment give you the best chance for managing medical conditions that are more common for people who are older than age 28. Falls are a major cause of broken bones and head injuries in people who are older than age 48. Take precautions to prevent a fall at home. Work with your health care provider to learn what changes you can make to improve your health and wellness and to prevent falls. This information is not intended to replace advice given to you by your health care provider. Make sure you discuss any questions you have with your health care provider. Document Revised: 09/08/2020 Document Reviewed: 09/08/2020 Elsevier Patient Education  2024 [employer].

## 2023-09-19 NOTE — Progress Notes (Signed)
 [name] [name] 73 y.o.   Chief Complaint  Patient presents with   Hospitalization Follow-up    Patient here for f/u for her GI bleed. She is feeling better. She has had her pacemaker implant placed last Tuesday, she states as far as her body she feels good but has been a little tired. Patient also mentions that her colonoscopy had to be put off because her heart rate was low. She also mentions having 3 bleeds since she left the hospital the 15th was the first time she had a bowel movement without a bleed.     HISTORY OF PRESENT ILLNESS: This is a 73 y.o. female here for hospital discharge follow-up Was admitted on 09/07/2023 with lower GI bleed and released in stable condition on 09/09/2023 Accompanied by friend today.  Feeling much better. Had permanent pacemaker placed on 09/13/2023.  No complications. Colonoscopy was rescheduled for June 5th No other complaints or medical concerns today. Hospital discharge summary as follows: Physician Discharge Summary  [name] [name] ZOX:[id number] DOB: 1950/11/20 DOA: 09/07/2023   PCP: [name] Hammersmith, MD   Admit date: 09/07/2023 Discharge date: 09/09/2023   Admitted From: home Disposition:  Home   Recommendations for Outpatient Follow-up:  Follow up with EP next week on 09/13/2023 for pacemaker placement Follow-up with GI as an outpatient for colonoscopy post pacemaker placement Please obtain BMP/CBC in one week PCP to refer to oncology for possible bone marrow biopsy when she has completed her workup for pacemaker placement and colonoscopy.     Home Health:No Equipment/Devices:None   Discharge Condition:Stable CODE STATUS:Full Diet recommendation: Heart Healthy   Brief/Interim Summary: 73 y.o. female past medical history of symptomatic bradycardia without upcoming pacemaker placement on 09/13/2023, history of seizure disorder, chronic disease stage III yea, diabetes mellitus type 2 transfer from the [facility] with rectal bleeding the  patient relates started about 4 months prior to admission after period of constipation with rectal pain.    Discharge Diagnoses:  Principal Problem:   Rectal bleed Active Problems:   HTN (hypertension)   TIA (transient ischemic attack)   Seizure disorder (HCC)   Symptomatic bradycardia   CKD (chronic kidney disease) stage 3, GFR 30-59 ml/min (HCC)   Controlled type 2 diabetes mellitus without complication, without long-term current use of insulin (HCC)   Lower GI bleed   Rectal bleeding/lower GI bleed: With intermittent dark stools for about 4 months prior to admission hemoglobin on admission 9.7. She usually ranges around 10-11. GI was consulted recommended a CT angio of the abdomen pelvis that showed negative for bleed. Because of her history of bradycardia and scheduled for pacemaker placement 09/13/2018 5 GI held on colonoscopy which will be done post pacemaker placement. We held aspirin  he will ask EP post pacemaker placement if she can resume her aspirin . I concerned that she is having hemorrhoidal bleeds and she has a mild leukopenia, that this anemia might be multifactorial question MDS PCP to refer her to oncologist for possible bone marrow biopsy post colonoscopy.   Essential hypertension: She can resume her antihypertensive medication in 2 days no changes made to her medication.   Sinus bradycardia: Appears to be asymptomatic, she is scheduled for pacemaker placement 09/13/2023 by EP.   History of TIA: Holding aspirin  continue statins.   History of seizure disorder: Continue Keppra  and carbamazepine .   Chronic kidney disease stage IIIb: Her creatinine appears to be at baseline.   Controlled diabetes mellitus type 2: With last A1c of 6.5. Continue current regimen  and carb out of diet.    HPI   Prior to Admission medications   Medication Sig Start Date End Date Taking? Authorizing Provider  acetaminophen  (TYLENOL ) 325 MG tablet Take 650 mg by mouth at bedtime as  needed for moderate pain (pain score 4-6).   Yes [provider]  aspirin  81 MG chewable tablet Chew 1 tablet (81 mg total) by mouth daily. 09/16/23  Yes Mealor, Augustus E, MD  carbamazepine  (TEGRETOL ) 200 MG tablet TAKE 1 AND 1/2 TABLETS BY MOUTH TWO TIMES A DAY Patient taking differently: No sig reported 01/24/23  Yes Wess Hammed, NP  Cholecalciferol (VITAMIN D -3 PO) Take 1 capsule by mouth daily after supper.   Yes [provider]  escitalopram  (LEXAPRO ) 10 MG tablet TAKE 1 TABLET(10 MG) BY MOUTH AT BEDTIME Patient taking differently: No sig reported 08/20/23  Yes Syd Newsome, Isidro Margo, MD  ezetimibe  (ZETIA ) 10 MG tablet TAKE 1 TABLET(10 MG) BY MOUTH DAILY Patient taking differently: Take 10 mg by mouth daily with supper. 04/05/23  Yes Wendie Hamburg, MD  levETIRAcetam  (KEPPRA ) 750 MG tablet TAKE 2 TABLETS(1500 MG) BY MOUTH TWICE DAILY Patient taking differently: Take 1,500 mg by mouth 2 (two) times daily. 10/28/22  Yes Wess Hammed, NP  lisinopril -hydrochlorothiazide  (ZESTORETIC ) 20-25 MG tablet Take 1 tablet by mouth daily. 09/11/23  Yes Macdonald Savoy, MD  MELATONIN PO Take 2 each by mouth at bedtime as needed (relaxation). OTC Melatonin Gummies   Yes [provider]  Multiple Vitamins-Minerals (MULTIVITAMIN WOMEN 50+) TABS Take 1 tablet by mouth daily after supper.   Yes [provider]  Na Sulfate-K Sulfate-Mg Sulfate concentrate (SUPREP BOWEL PREP KIT) 17.5-3.13-1.6 GM/177ML SOLN Take 1 kit (354 mLs total) by mouth as directed. 09/12/23  Yes Cirigliano, Vito V, DO  nitroGLYCERIN  (NITROSTAT ) 0.4 MG SL tablet Place 1 tablet (0.4 mg total) under the tongue every 5 (five) minutes as needed for chest pain. 12/21/21  Yes Wendie Hamburg, MD  rosuvastatin  (CRESTOR ) 40 MG tablet TAKE 1 TABLET(40 MG) BY MOUTH DAILY Patient taking differently: Take 40 mg by mouth daily with supper. 12/27/22  Yes Wendie Hamburg, MD    Allergies   Allergen Reactions   Nsaids Other (See Comments)    CKD stage 3     Patient Active Problem List   Diagnosis Date Noted   Lower GI bleed 09/09/2023   CKD (chronic kidney disease) stage 3, GFR 30-59 ml/min (HCC) 09/08/2023   Controlled type 2 diabetes mellitus without complication, without long-term current use of insulin (HCC) 09/08/2023   Rectal bleed 09/07/2023   Symptomatic bradycardia 08/04/2023   Bilateral sciatica 02/03/2023   Palpitations 04/08/2022   Tiredness 04/08/2022   Dyspnea on exertion 12/17/2021   Seizure disorder (HCC) 05/28/2019   Prediabetes 05/28/2019   Staring episodes 11/24/2017   OSA (obstructive sleep apnea) 11/30/2016   Chronic insomnia 10/13/2016   TIA (transient ischemic attack) 08/26/2016   Transient memory loss 08/26/2016   Morbid obesity (HCC) 05/31/2016   Glucose intolerance (impaired glucose tolerance) 05/18/2016   Pronation deformity of ankle, acquired 04/19/2014   Generalized anxiety disorder 03/15/2014   Memory difficulty 03/14/2014   Demand myocardial ischemic related infarction 01/23/2014    Class: Diagnosis of   Depression, reactive 10/12/2012   Chronic neutropenia (HCC) 04/14/2012   Dyslipidemia 10/20/2011   HTN (hypertension) 10/20/2011    Past Medical History:  Diagnosis Date   Anxiety    Cataract    Chronic insomnia 10/13/2016  Depression    Gait disorder 03/14/2014   HTN (hypertension)    Hx of cardiovascular stress test    Lexiscan  Myoview (1/16):  No ischemia, EF 68%, breast atten; Low Risk   Hyperlipidemia    Phreesia 07/18/2020   Hypertension    Phreesia 07/18/2020   Memory difficulty 03/14/2014   Meningitis due to bacteria 01/18/2014   OSA (obstructive sleep apnea) 11/30/2016   Seizures (HCC)    Sleep apnea    Phreesia 07/18/2020   TIA (transient ischemic attack)     Past Surgical History:  Procedure Laterality Date   NO PAST SURGERIES     PACEMAKER IMPLANT N/A 09/13/2023   Procedure: PACEMAKER IMPLANT;   Surgeon: Efraim Grange, MD;  Location: [facility];  Service: Cardiovascular;  Laterality: N/A;    Social History   Socioeconomic History   Marital status: Single    Spouse name: Not on file   Number of children: 1   Years of education: college   Highest education level: Some college, no degree  Occupational History   Occupation: Retired Financial planner: [employer]  Tobacco Use   Smoking status: Former    Current packs/day: 0.00    Average packs/day: 0.5 packs/day for 40.0 years (20.0 ttl pk-yrs)    Types: Cigarettes    Start date: 07/09/1971    Quit date: 07/09/2011    Years since quitting: 12.2   Smokeless tobacco: Never  Vaping Use   Vaping status: Never Used  Substance and Sexual Activity   Alcohol use: Not Currently    Comment: Rare   Drug use: No   Sexual activity: Never    Birth control/protection: Abstinence  Other Topics Concern   Not on file  Social History Narrative   Marital status: divorced x 35 years; not dating; not interested in 2019.      Children: 1 child/son (44); 2 grandchildren; [place]      Lives:  Alone in [place] in [place].      Employment: retired 2008; Administrator, arts.      Tobacco: quit smoking in 2013.  Smoked x many years       Alcohol: none; holidays only      Exercise: none; quit exercising two years ago in 2015.        Seatbelt: 100%; no texting. Does not drive in [address] due to seizure activity.       ADLs: no driving in [place] due to seizure activity; independent with ADLs.  Has cane for long distances      Advanced Directives: none; [name]/sister.  FULL CODE no prolonged measures.         Patient is single lives at home alone, has 1 child   Patient is right handed   Education level is 1 year of college   Caffeine consumption is 2 cups daily   Social Drivers of Health   Financial Resource Strain: Low Risk  (08/03/2023)   Overall Financial Resource Strain (CARDIA)    Difficulty of Paying Living  Expenses: Not hard at all  Food Insecurity: No Food Insecurity (09/08/2023)   Hunger Vital Sign    Worried About Running Out of Food in the Last Year: Never true    Ran Out of Food in the Last Year: Never true  Transportation Needs: No Transportation Needs (09/08/2023)   PRAPARE - Administrator, Civil Service (Medical): No    Lack of Transportation (Non-Medical): No  Physical Activity: Inactive (  08/03/2023)   Exercise Vital Sign    Days of Exercise per Week: 0 days    Minutes of Exercise per Session: 0 min  Stress: No Stress Concern Present (08/03/2023)   [employer] of Occupational Health - Occupational Stress Questionnaire    Feeling of Stress : Only a little  Social Connections: Unknown (09/08/2023)   Social Connection and Isolation Panel [NHANES]    Frequency of Communication with Friends and Family: Three times a week    Frequency of Social Gatherings with Friends and Family: Three times a week    Attends Religious Services: 1 to 4 times per year    Active Member of Clubs or Organizations: Yes    Attends Banker Meetings: More than 4 times per year    Marital Status: Patient declined  Intimate Partner Violence: Not At Risk (09/08/2023)   Humiliation, Afraid, Rape, and Kick questionnaire    Fear of Current or Ex-Partner: No    Emotionally Abused: No    Physically Abused: No    Sexually Abused: No    Family History  Problem Relation Age of Onset   Heart attack Mother 59   Stroke Mother    Hypertension Mother    Heart attack Father 64   Cerebral aneurysm Sister    Hypertension Sister    Kidney disease Sister        ESRD; HD in last year   Hypertension Brother    Hyperlipidemia Brother    Rheum arthritis Sister    Arthritis Sister    Hypertension Brother    Hyperlipidemia Brother    Hyperlipidemia Brother    Hypertension Brother      Review of Systems  Constitutional: Negative.  Negative for chills and fever.  HENT: Negative.  Negative for  congestion and sore throat.   Respiratory: Negative.  Negative for cough and shortness of breath.   Cardiovascular: Negative.  Negative for chest pain and palpitations.  Gastrointestinal:  Negative for abdominal pain, diarrhea, nausea and vomiting.  Genitourinary: Negative.  Negative for dysuria and hematuria.  Skin: Negative.  Negative for rash.  Neurological:  Negative for dizziness and headaches.  All other systems reviewed and are negative.   Vitals:   09/19/23 1437  BP: 112/78  Pulse: 62  Temp: 98.5 F (36.9 C)  SpO2: 98%    Physical Exam Vitals reviewed.  Constitutional:      Appearance: Normal appearance.  HENT:     Head: Normocephalic.     Mouth/Throat:     Mouth: Mucous membranes are moist.     Pharynx: Oropharynx is clear.  Eyes:     Extraocular Movements: Extraocular movements intact.     Conjunctiva/sclera: Conjunctivae normal.     Pupils: Pupils are equal, round, and reactive to light.  Cardiovascular:     Rate and Rhythm: Normal rate and regular rhythm.     Pulses: Normal pulses.     Heart sounds: Normal heart sounds.  Pulmonary:     Effort: Pulmonary effort is normal.     Breath sounds: Normal breath sounds.  Abdominal:     Palpations: Abdomen is soft.     Tenderness: There is no abdominal tenderness.  Musculoskeletal:     Cervical back: No tenderness.  Lymphadenopathy:     Cervical: No cervical adenopathy.  Skin:    General: Skin is warm and dry.     Capillary Refill: Capillary refill takes less than 2 seconds.  Neurological:     General: No focal  deficit present.     Mental Status: She is alert and oriented to person, place, and time.  Psychiatric:        Mood and Affect: Mood normal.        Behavior: Behavior normal.      ASSESSMENT & PLAN: A total of 43 minutes was spent with the patient and counseling/coordination of care regarding preparing for this visit, review of most recent office visit notes, review of recent discharge summary,  review of multiple chronic medical conditions and their management, review of all medications, review of most recent bloodwork results, review of health maintenance items, education on nutrition, prognosis, documentation, and need for follow up.  Problem List Items Addressed This Visit       Cardiovascular and Mediastinum   HTN (hypertension) (Chronic)   BP Readings from Last 3 Encounters:  09/19/23 112/78  09/13/23 (!) 143/63  09/09/23 (!) 115/53  Well-controlled hypertension. Continue Zestoretic  20-25 mg daily         Respiratory   OSA (obstructive sleep apnea)   Stable. On CPAP treatment         Digestive   Lower GI bleed - Primary   Stable and well-controlled. Bowel movement without blood yesterday and today Scheduled for colonoscopy early next month No concerns at this point.        Nervous and Auditory   Seizure disorder (HCC)   Stable and well-controlled Continues carbamazepine  and Keppra .  Medications managed by neurologist      Relevant Medications   zolpidem  (AMBIEN ) 5 MG tablet     Other   Dyslipidemia   Diet and nutrition discussed Lipid profile done today Continue rosuvastatin  40 mg daily        Generalized anxiety disorder   Continue Lexapro  10 mg daily.      Morbid obesity (HCC)   Diet and nutrition discussed Advised to decrease amount of daily carbohydrate intake and daily calories and increase amount of plant-based protein in her diet      Chronic insomnia   Chronic and affecting quality of life Over-the-counter sleep aids not helping Recommend Ambien  5 mg at bedtime      Relevant Medications   zolpidem  (AMBIEN ) 5 MG tablet   Other Visit Diagnoses       Hospital discharge follow-up          Patient Instructions  Health Maintenance After Age 57 After age 34, you are at a higher risk for certain long-term diseases and infections as well as injuries from falls. Falls are a major cause of broken bones and head injuries in people  who are older than age 35. Getting regular preventive care can help to keep you healthy and well. Preventive care includes getting regular testing and making lifestyle changes as recommended by your health care provider. Talk with your health care provider about: Which screenings and tests you should have. A screening is a test that checks for a disease when you have no symptoms. A diet and exercise plan that is right for you. What should I know about screenings and tests to prevent falls? Screening and testing are the best ways to find a health problem early. Early diagnosis and treatment give you the best chance of managing medical conditions that are common after age 44. Certain conditions and lifestyle choices may make you more likely to have a fall. Your health care provider may recommend: Regular vision checks. Poor vision and conditions such as cataracts can make you more likely to  have a fall. If you wear glasses, make sure to get your prescription updated if your vision changes. Medicine review. Work with your health care provider to regularly review all of the medicines you are taking, including over-the-counter medicines. Ask your health care provider about any side effects that may make you more likely to have a fall. Tell your health care provider if any medicines that you take make you feel dizzy or sleepy. Strength and balance checks. Your health care provider may recommend certain tests to check your strength and balance while standing, walking, or changing positions. Foot health exam. Foot pain and numbness, as well as not wearing proper footwear, can make you more likely to have a fall. Screenings, including: Osteoporosis screening. Osteoporosis is a condition that causes the bones to get weaker and break more easily. Blood pressure screening. Blood pressure changes and medicines to control blood pressure can make you feel dizzy. Depression screening. You may be more likely to have a  fall if you have a fear of falling, feel depressed, or feel unable to do activities that you used to do. Alcohol use screening. Using too much alcohol can affect your balance and may make you more likely to have a fall. Follow these instructions at home: Lifestyle Do not drink alcohol if: Your health care provider tells you not to drink. If you drink alcohol: Limit how much you have to: 0-1 drink a day for women. 0-2 drinks a day for men. Know how much alcohol is in your drink. In the [place] one drink equals one 12 oz bottle of beer (355 mL), one 5 oz glass of wine (148 mL), or one 1 oz glass of hard liquor (44 mL). Do not use any products that contain nicotine or tobacco. These products include cigarettes, chewing tobacco, and vaping devices, such as e-cigarettes. If you need help quitting, ask your health care provider. Activity  Follow a regular exercise program to stay fit. This will help you maintain your balance. Ask your health care provider what types of exercise are appropriate for you. If you need a cane or walker, use it as recommended by your health care provider. Wear supportive shoes that have nonskid soles. Safety  Remove any tripping hazards, such as rugs, cords, and clutter. Install safety equipment such as grab bars in bathrooms and safety rails on stairs. Keep rooms and walkways well-lit. General instructions Talk with your health care provider about your risks for falling. Tell your health care provider if: You fall. Be sure to tell your health care provider about all falls, even ones that seem minor. You feel dizzy, tiredness (fatigue), or off-balance. Take over-the-counter and prescription medicines only as told by your health care provider. These include supplements. Eat a healthy diet and maintain a healthy weight. A healthy diet includes low-fat dairy products, low-fat (lean) meats, and fiber from whole grains, beans, and lots of fruits and vegetables. Stay  current with your vaccines. Schedule regular health, dental, and eye exams. Summary Having a healthy lifestyle and getting preventive care can help to protect your health and wellness after age 74. Screening and testing are the best way to find a health problem early and help you avoid having a fall. Early diagnosis and treatment give you the best chance for managing medical conditions that are more common for people who are older than age 77. Falls are a major cause of broken bones and head injuries in people who are older than age 3. Take precautions to  prevent a fall at home. Work with your health care provider to learn what changes you can make to improve your health and wellness and to prevent falls. This information is not intended to replace advice given to you by your health care provider. Make sure you discuss any questions you have with your health care provider. Document Revised: 09/08/2020 Document Reviewed: 09/08/2020 Elsevier Patient Education  2024 Elsevier Inc.      Maryagnes Small, MD [facility]

## 2023-09-22 ENCOUNTER — Encounter: Admitting: Gastroenterology

## 2023-09-23 ENCOUNTER — Encounter: Admitting: Gastroenterology

## 2023-09-28 ENCOUNTER — Ambulatory Visit: Attending: Cardiovascular Disease

## 2023-09-28 DIAGNOSIS — R001 Bradycardia, unspecified: Secondary | ICD-10-CM

## 2023-09-28 NOTE — Patient Instructions (Signed)

## 2023-09-28 NOTE — Progress Notes (Signed)
 Normal dual chamber pacemaker wound check. Presenting rhythm: AP/VS 64 . Wound well healed. Routine testing performed. Thresholds, sensing, and impedances consistent with implant measurements and at 3.5V safety margin/auto capture until 3 month visit. No episodes. Reviewed arm restrictions to continue for 6 weeks total post op.  Pt enrolled in remote follow-up.

## 2023-10-02 NOTE — Progress Notes (Unsigned)
 Cardiology Office Note:    Date:  10/03/2023   ID:  [name] [name], DOB [birth year], MRN [id number]  PCP:  Elvira Hammersmith, MD  Cardiologist:  Wendie Hamburg, MD  Electrophysiologist:  None   Referring MD: Elvira Hammersmith, *   Chief Complaint  Patient presents with   Coronary Artery Disease    History of Present Illness:    [name] [name] is a 73 y.o. female with a hx of SSS status post PPM, hypertension, hyperlipidemia, OSA, TIA, bacterial meningitis, seizures who presents for follow-up.  She was referred by Dr. Sagardia for evaluation of chest pain and dyspnea on exertion, initially seen on 12/21/2021.    Echocardiogram 01/01/2022 showed normal biventricular function, no significant valvular disease.  Coronary CTA on 01/13/2022 showed calcium  score 572 (95th percentile), moderate disease in RCA and LCx, no significant CAD by CT FFR.  She was noted at clinic visit 04/2022 to be in junctional rhythm with rate in 40s.  She was on carvedilol  which was discontinued.  Zio patch x 8 days on 04/29/2022 showed episodes of junctional rhythm, but normal rate.  Zio patch x 7 days 02/2023 showed occasional PVCs (1.7%), junctional rhythm present but no symptoms reported.  Since last clinic visit, she was admitted 09/2023 with suspected lower GI bleed.  GI was consulted and recommended CTA abdomen pelvis which was unremarkable.  Plan for colonoscopy but recommended holding off until after patient had pacemaker.  She underwent successful pacemaker placement with Dr. Arlester Ladd on 09/13/2023.  She reports no recent bleeding.  States that her fatigue has improved since pacemaker placement.  Reports a brief episode of lightheadedness when looking down at church yesterday otherwise denies any lightheadedness or syncope.  Denies any chest pain, dyspnea, lower extremity edema, or palpitations. She reports she can walk up a flight of stairs without stopping, denies any exertional symptoms.   Reports compliance with CPAP.  Past Medical History:  Diagnosis Date   Anxiety    Cataract    Chronic insomnia 10/13/2016   Depression    Gait disorder 03/14/2014   HTN (hypertension)    Hx of cardiovascular stress test    Lexiscan  Myoview (1/16):  No ischemia, EF 68%, breast atten; Low Risk   Hyperlipidemia    Phreesia 07/18/2020   Hypertension    Phreesia 07/18/2020   Memory difficulty 03/14/2014   Meningitis due to bacteria 01/18/2014   OSA (obstructive sleep apnea) 11/30/2016   Seizures (HCC)    Sleep apnea    Phreesia 07/18/2020   TIA (transient ischemic attack)     Past Surgical History:  Procedure Laterality Date   NO PAST SURGERIES     PACEMAKER IMPLANT N/A 09/13/2023   Procedure: PACEMAKER IMPLANT;  Surgeon: Efraim Grange, MD;  Location: [facility];  Service: Cardiovascular;  Laterality: N/A;    Current Medications: Current Meds  Medication Sig   acetaminophen  (TYLENOL ) 325 MG tablet Take 650 mg by mouth at bedtime as needed for moderate pain (pain score 4-6).   aspirin  81 MG chewable tablet Chew 1 tablet (81 mg total) by mouth daily.   carbamazepine  (TEGRETOL ) 200 MG tablet TAKE 1 AND 1/2 TABLETS BY MOUTH TWO TIMES A DAY (Patient taking differently: No sig reported)   Cholecalciferol (VITAMIN D -3 PO) Take 1 capsule by mouth daily after supper.   escitalopram  (LEXAPRO ) 10 MG tablet TAKE 1 TABLET(10 MG) BY MOUTH AT BEDTIME (Patient taking differently: No sig reported)   ezetimibe  (ZETIA ) 10 MG  tablet TAKE 1 TABLET(10 MG) BY MOUTH DAILY (Patient taking differently: Take 10 mg by mouth daily with supper.)   levETIRAcetam  (KEPPRA ) 750 MG tablet TAKE 2 TABLETS(1500 MG) BY MOUTH TWICE DAILY (Patient taking differently: Take 1,500 mg by mouth 2 (two) times daily.)   lisinopril -hydrochlorothiazide  (ZESTORETIC ) 20-25 MG tablet Take 1 tablet by mouth daily.   MELATONIN PO Take 2 each by mouth at bedtime as needed (relaxation). OTC Melatonin Gummies   Multiple  Vitamins-Minerals (MULTIVITAMIN WOMEN 50+) TABS Take 1 tablet by mouth daily after supper.   Na Sulfate-K Sulfate-Mg Sulfate concentrate (SUPREP BOWEL PREP KIT) 17.5-3.13-1.6 GM/177ML SOLN Take 1 kit (354 mLs total) by mouth as directed.   nitroGLYCERIN  (NITROSTAT ) 0.4 MG SL tablet Place 1 tablet (0.4 mg total) under the tongue every 5 (five) minutes as needed for chest pain.   rosuvastatin  (CRESTOR ) 40 MG tablet TAKE 1 TABLET(40 MG) BY MOUTH DAILY (Patient taking differently: Take 40 mg by mouth daily with supper.)     Allergies:   Nsaids   Social History   Socioeconomic History   Marital status: Single    Spouse name: Not on file   Number of children: 1   Years of education: college   Highest education level: Some college, no degree  Occupational History   Occupation: Retired Financial planner: [employer]  Tobacco Use   Smoking status: Former    Current packs/day: 0.00    Average packs/day: 0.5 packs/day for 40.0 years (20.0 ttl pk-yrs)    Types: Cigarettes    Start date: 07/09/1971    Quit date: 07/09/2011    Years since quitting: 12.2   Smokeless tobacco: Never  Vaping Use   Vaping status: Never Used  Substance and Sexual Activity   Alcohol use: Not Currently    Comment: Rare   Drug use: No   Sexual activity: Never    Birth control/protection: Abstinence  Other Topics Concern   Not on file  Social History Narrative   Marital status: divorced x 35 years; not dating; not interested in 2019.      Children: 1 child/son (44); 2 grandchildren; [place]      Lives:  Alone in [place] in [place].      Employment: retired 2008; Administrator, arts.      Tobacco: quit smoking in 2013.  Smoked x many years       Alcohol: none; holidays only      Exercise: none; quit exercising two years ago in 2015.        Seatbelt: 100%; no texting. Does not drive in [address]10 due to seizure activity.       ADLs: no driving in 9604-5409 due to seizure activity; independent with ADLs.  Has cane  for long distances      Advanced Directives: none; [name]/sister.  FULL CODE no prolonged measures.         Patient is single lives at home alone, has 1 child   Patient is right handed   Education level is 1 year of college   Caffeine consumption is 2 cups daily   Social Drivers of Health   Financial Resource Strain: Low Risk  (08/03/2023)   Overall Financial Resource Strain (CARDIA)    Difficulty of Paying Living Expenses: Not hard at all  Food Insecurity: No Food Insecurity (09/08/2023)   Hunger Vital Sign    Worried About Running Out of Food in the Last Year: Never true    Ran Out of Food  in the Last Year: Never true  Transportation Needs: No Transportation Needs (09/08/2023)   PRAPARE - Administrator, Civil Service (Medical): No    Lack of Transportation (Non-Medical): No  Physical Activity: Inactive (08/03/2023)   Exercise Vital Sign    Days of Exercise per Week: 0 days    Minutes of Exercise per Session: 0 min  Stress: No Stress Concern Present (08/03/2023)   [employer] of Occupational Health - Occupational Stress Questionnaire    Feeling of Stress : Only a little  Social Connections: Unknown (09/08/2023)   Social Connection and Isolation Panel [NHANES]    Frequency of Communication with Friends and Family: Three times a week    Frequency of Social Gatherings with Friends and Family: Three times a week    Attends Religious Services: 1 to 4 times per year    Active Member of Clubs or Organizations: Yes    Attends Engineer, structural: More than 4 times per year    Marital Status: Patient declined     Family History: The patient's family history includes Arthritis in her sister; Cerebral aneurysm in her sister; Heart attack (age of onset: 74) in her father; Heart attack (age of onset: 34) in her mother; Hyperlipidemia in her brother, brother, and brother; Hypertension in her brother, brother, brother, mother, and sister; Kidney disease in her  sister; Rheum arthritis in her sister; Stroke in her mother.  ROS:   Please see the history of present illness.     All other systems reviewed and are negative.  EKGs/Labs/Other Studies Reviewed:    The following studies were reviewed today:   EKG:   02/20/2023: Sinus bradycardia with junctional beats, rate 44 05/24/22: sinus bradycardia, rate 54, no ST abnormality 04/12/22: Junctional rhythm heart rate 45, no ST abnormality 12/21/2021: Sinus bradycardia, rate 54, first-degree AV block 06/28/23: Sinus bradycardia, rate 39  Recent Labs: 08/04/2023: TSH 2.16 09/07/2023: ALT 14 09/08/2023: BUN 22; Creatinine, Ser 1.06; Magnesium  1.9; Platelets 155; Potassium 4.0; Sodium 139 09/09/2023: Hemoglobin 9.9  Recent Lipid Panel    Component Value Date/Time   CHOL 179 08/04/2023 1103   CHOL 174 08/17/2022 1027   TRIG 83.0 08/04/2023 1103   HDL 80.50 08/04/2023 1103   HDL 85 08/17/2022 1027   CHOLHDL 2 08/04/2023 1103   VLDL 16.6 08/04/2023 1103   LDLCALC 82 08/04/2023 1103   LDLCALC 76 08/17/2022 1027    Physical Exam:    VS:  BP 118/60   Pulse 60   Ht 5\' 5"  (1.651 m)   Wt 255 lb (115.7 kg)   SpO2 97%   BMI 42.43 kg/m     Wt Readings from Last 3 Encounters:  10/03/23 255 lb (115.7 kg)  09/19/23 254 lb (115.2 kg)  09/13/23 253 lb (114.8 kg)     GEN: Well nourished, well developed in no acute distress HEENT: Normal NECK: No JVD; No carotid bruits LYMPHATICS: No lymphadenopathy CARDIAC: RRR, no murmurs, rubs, gallops RESPIRATORY:  Clear to auscultation without rales, wheezing or rhonchi  ABDOMEN: Soft, non-tender, non-distended MUSCULOSKELETAL:  No edema; No deformity  SKIN: Warm and dry NEUROLOGIC:  Alert and oriented x 3 PSYCHIATRIC:  Normal affect   ASSESSMENT:    1. Pacemaker   2. Coronary artery disease involving native coronary artery of native heart without angina pectoris   3. Essential hypertension   4. Hyperlipidemia, unspecified hyperlipidemia type   5. Pre-op  evaluation      PLAN:    SSS  s/p PPM: She was noted at clinic visit 04/2022 to be in junctional rhythm with rate in 40s.  She was on carvedilol  which was discontinued.  Zio patch x 8 days on 04/29/2022 showed episodes of junctional rhythm, but normal rate.  At clinic appointment 06/2023, was in sinus bradycardia with rate 39.  She reported having significant fatigue, suspect symptomatic bradycardia.  Referred to EP and underwent PPM placement with Dr. Arlester Ladd 09/2023, reports significant improvement in fatigue since pacemaker placement  CAD: reported chest pain with possible angina, as describes central chest pressure that can occur with exertion, though not reliably brought on by exertion and can also occur at rest.  She does have significant CAD risk factors (age, former tobacco use, hypertension, hyperlipidemia, family history).  Coronary CTA on 01/13/2022 showed calcium  score 572 (95th percentile), moderate disease in RCA and LCx, not significant CAD by CT FFR.  Echocardiogram 09/2023 showed EF 65 to 70%, mild RV dysfunction, no significant valvular disease. -Continue ASA -Continue rosuvastatin  and Zetia   Hypertension: On lisinopril -HCTZ 20-25 mg daily.  Appears controlled.  Hyperlipidemia: On rosuvastatin  40 mg daily, LDL 95 on 04/12/2022.  Zetia  10 mg daily added.  Reported worsening palpitations when she started Zetia  so she stopped taking.  Not clear that this was related to Zetia  use.  Rechallenged with zetia  and reports tolerating well.  LDL 71 on 02/03/2023  T2DM: A1c 6.5% on 08/04/2023.  Stopped taking metformin   OSA: on CPAP, reports compliance  Preop evaluation: she was admitted 09/2023 with suspected lower GI bleed.  GI was consulted and recommended CTA abdomen pelvis which was unremarkable.  Plan for colonoscopy but recommended holding off until after patient had pacemaker.  She underwent successful pacemaker placement with Dr. Arlester Ladd on 09/13/2023.  Colonoscopy planned for 6/5.  Functional  capacity greater than 4 METS, denies any anginal symptoms.  Low risk procedure, no further cardiac workup recommended prior to procedure.  RTC in 6 months   Medication Adjustments/Labs and Tests Ordered: Current medicines are reviewed at length with the patient today.  Concerns regarding medicines are outlined above.  No orders of the defined types were placed in this encounter.  No orders of the defined types were placed in this encounter.   Patient Instructions  Medication Instructions:  Continue current medications *If you need a refill on your cardiac medications before your next appointment, please call your pharmacy*  Lab Work: none If you have labs (blood work) drawn today and your tests are completely normal, you will receive your results only by: MyChart Message (if you have MyChart) OR A paper copy in the mail If you have any lab test that is abnormal or we need to change your treatment, we will call you to review the results.  Testing/Procedures: none  Follow-Up: At [facility] you and your health needs are our priority.  As part of our continuing mission to provide you with exceptional heart care, our providers are all part of one team.  This team includes your primary Cardiologist (physician) and Advanced Practice Providers or APPs (Physician Assistants and Nurse Practitioners) who all work together to provide you with the care you need, when you need it.  Your next appointment:   6 month(s)  Provider:   Wendie Hamburg, MD    We recommend signing up for the patient portal called "MyChart".  Sign up information is provided on this After Visit Summary.  MyChart is used to connect with patients for Virtual Visits (Telemedicine).  Patients  are able to view lab/test results, encounter notes, upcoming appointments, etc.  Non-urgent messages can be sent to your provider as well.   To learn more about what you can do with MyChart, go to  [email or web address].   Other Instructions none       Signed, Wendie Hamburg, MD  10/03/2023 9:21 AM    [facility]

## 2023-10-03 ENCOUNTER — Ambulatory Visit: Payer: Medicare Other | Attending: Cardiology | Admitting: Cardiology

## 2023-10-03 ENCOUNTER — Encounter: Payer: Self-pay | Admitting: Cardiology

## 2023-10-03 VITALS — BP 118/60 | HR 60 | Ht 65.0 in | Wt 255.0 lb

## 2023-10-03 DIAGNOSIS — Z01818 Encounter for other preprocedural examination: Secondary | ICD-10-CM

## 2023-10-03 DIAGNOSIS — Z950 Presence of cardiac pacemaker: Secondary | ICD-10-CM | POA: Diagnosis not present

## 2023-10-03 DIAGNOSIS — I10 Essential (primary) hypertension: Secondary | ICD-10-CM | POA: Diagnosis not present

## 2023-10-03 DIAGNOSIS — I2510 Atherosclerotic heart disease of native coronary artery without angina pectoris: Secondary | ICD-10-CM

## 2023-10-03 DIAGNOSIS — E785 Hyperlipidemia, unspecified: Secondary | ICD-10-CM | POA: Diagnosis not present

## 2023-10-03 NOTE — Patient Instructions (Addendum)
 Medication Instructions:  Continue current medications *If you need a refill on your cardiac medications before your next appointment, please call your pharmacy*  Lab Work: none If you have labs (blood work) drawn today and your tests are completely normal, you will receive your results only by: MyChart Message (if you have MyChart) OR A paper copy in the mail If you have any lab test that is abnormal or we need to change your treatment, we will call you to review the results.  Testing/Procedures: none  Follow-Up: At [facility], you and your health needs are our priority.  As part of our continuing mission to provide you with exceptional heart care, our providers are all part of one team.  This team includes your primary Cardiologist (physician) and Advanced Practice Providers or APPs (Physician Assistants and Nurse Practitioners) who all work together to provide you with the care you need, when you need it.  Your next appointment:   6 month(s)  Provider:   Wendie Hamburg, MD    We recommend signing up for the patient portal called "MyChart".  Sign up information is provided on this After Visit Summary.  MyChart is used to connect with patients for Virtual Visits (Telemedicine).  Patients are able to view lab/test results, encounter notes, upcoming appointments, etc.  Non-urgent messages can be sent to your provider as well.   To learn more about what you can do with MyChart, go to [email or web address].   Other Instructions none

## 2023-10-06 ENCOUNTER — Encounter: Payer: Self-pay | Admitting: Gastroenterology

## 2023-10-06 ENCOUNTER — Ambulatory Visit: Admitting: Gastroenterology

## 2023-10-06 ENCOUNTER — Telehealth: Payer: Self-pay

## 2023-10-06 VITALS — BP 137/64 | HR 63 | Temp 97.2°F | Resp 13 | Ht 65.0 in | Wt 255.0 lb

## 2023-10-06 DIAGNOSIS — D125 Benign neoplasm of sigmoid colon: Secondary | ICD-10-CM

## 2023-10-06 DIAGNOSIS — G4733 Obstructive sleep apnea (adult) (pediatric): Secondary | ICD-10-CM | POA: Diagnosis not present

## 2023-10-06 DIAGNOSIS — K641 Second degree hemorrhoids: Secondary | ICD-10-CM | POA: Diagnosis not present

## 2023-10-06 DIAGNOSIS — K635 Polyp of colon: Secondary | ICD-10-CM

## 2023-10-06 DIAGNOSIS — D122 Benign neoplasm of ascending colon: Secondary | ICD-10-CM | POA: Diagnosis not present

## 2023-10-06 DIAGNOSIS — K921 Melena: Secondary | ICD-10-CM

## 2023-10-06 DIAGNOSIS — I10 Essential (primary) hypertension: Secondary | ICD-10-CM | POA: Diagnosis not present

## 2023-10-06 DIAGNOSIS — D649 Anemia, unspecified: Secondary | ICD-10-CM

## 2023-10-06 MED ORDER — SODIUM CHLORIDE 0.9 % IV SOLN: 500.0000 mL | Freq: Once | INTRAVENOUS | Status: DC

## 2023-10-06 NOTE — Patient Instructions (Signed)
 Resume previous diet Continue present medications Await pathology results Repeat colonoscopy for surveillance based on pathology results Use fiber, for example Citrucel, Fibercon, Konsyl or Metamucil Internal hemorrhoids were noted  See handouts on polyps and hemorrhoids YOU HAD AN ENDOSCOPIC PROCEDURE TODAY AT THE [facility]:   Refer to the procedure report that was given to you for any specific questions about what was found during the examination.  If the procedure report does not answer your questions, please call your gastroenterologist to clarify.  If you requested that your care partner not be given the details of your procedure findings, then the procedure report has been included in a sealed envelope for you to review at your convenience later.  YOU SHOULD EXPECT: Some feelings of bloating in the abdomen. Passage of more gas than usual.  Walking can help get rid of the air that was put into your GI tract during the procedure and reduce the bloating. If you had a lower endoscopy (such as a colonoscopy or flexible sigmoidoscopy) you may notice spotting of blood in your stool or on the toilet paper. If you underwent a bowel prep for your procedure, you may not have a normal bowel movement for a few days.  Please Note:  You might notice some irritation and congestion in your nose or some drainage.  This is from the oxygen used during your procedure.  There is no need for concern and it should clear up in a day or so.  SYMPTOMS TO REPORT IMMEDIATELY:  Following lower endoscopy (colonoscopy or flexible sigmoidoscopy):  Excessive amounts of blood in the stool  Significant tenderness or worsening of abdominal pains  Swelling of the abdomen that is new, acute  Fever of 100F or higher  For urgent or emergent issues, a gastroenterologist can be reached at any hour by calling [phone]. Do not use MyChart messaging for urgent concerns.   DIET:  We do recommend a small meal at  first, but then you may proceed to your regular diet.  Drink plenty of fluids but you should avoid alcoholic beverages for 24 hours.  ACTIVITY:  You should plan to take it easy for the rest of today and you should NOT DRIVE or use heavy machinery until tomorrow (because of the sedation medicines used during the test).    FOLLOW UP: Our staff will call the number listed on your records the next business day following your procedure.  We will call around 7:15- 8:00 am to check on you and address any questions or concerns that you may have regarding the information given to you following your procedure. If we do not reach you, we will leave a message.     If any biopsies were taken you will be contacted by phone or by letter within the next 1-3 weeks.  Please call us  at [phone] if you have not heard about the biopsies in 3 weeks.   SIGNATURES/CONFIDENTIALITY: You and/or your care partner have signed paperwork which will be entered into your electronic medical record.  These signatures attest to the fact that that the information above on your After Visit Summary has been reviewed and is understood.  Full responsibility of the confidentiality of this discharge information lies with you and/or your care-partner.

## 2023-10-06 NOTE — Telephone Encounter (Signed)
 Patient aware that she is scheduled for hemorrhoid banding with Dr Karene Oto on 12-07-23 at 11:20am.  Patient agreed to plan and verbalized understanding.

## 2023-10-06 NOTE — Progress Notes (Signed)
 Called to room to assist during endoscopic procedure.  Patient ID and intended procedure confirmed with present staff. Received instructions for my participation in the procedure from the performing physician.

## 2023-10-06 NOTE — Progress Notes (Signed)
 Denies being on home oxygen.

## 2023-10-06 NOTE — Op Note (Signed)
 [facility] Patient Name: [name] [name] Procedure Date: 10/06/2023 7:33 AM MRN: [id number] Endoscopist: Harry Lindau , MD, [id number] Age: 73 Referring MD:  Date of Birth: 1950/10/27 Gender: Female Account #: [id number] Procedure:                Colonoscopy Indications:              Hematochezia Medicines:                Monitored Anesthesia Care Procedure:                Pre-Anesthesia Assessment:                           - Prior to the procedure, a History and Physical                            was performed, and patient medications and                            allergies were reviewed. The patient's tolerance of                            previous anesthesia was also reviewed. The risks                            and benefits of the procedure and the sedation                            options and risks were discussed with the patient.                            All questions were answered, and informed consent                            was obtained. Prior Anticoagulants: The patient has                            taken no anticoagulant or antiplatelet agents. ASA                            Grade Assessment: III - A patient with severe                            systemic disease. After reviewing the risks and                            benefits, the patient was deemed in satisfactory                            condition to undergo the procedure.                           After obtaining informed consent, the colonoscope  was passed under direct vision. Throughout the                            procedure, the patient's blood pressure, pulse, and                            oxygen saturations were monitored continuously. The                            CF [id number] #[id number] was introduced through the anus                            and advanced to the the terminal ileum. The                            colonoscopy was performed without difficulty.  The                            patient tolerated the procedure well. The quality                            of the bowel preparation was good. The terminal                            ileum, ileocecal valve, appendiceal orifice, and                            rectum were photographed. Scope In: 7:44:27 AM Scope Out: 8:10:32 AM Scope Withdrawal Time: 0 hours 16 minutes 16 seconds  Total Procedure Duration: 0 hours 26 minutes 5 seconds  Findings:                 The perianal and digital rectal examinations were                            normal.                           A 2 mm polyp was found in the ascending colon. The                            polyp was sessile. The polyp was removed with a                            cold snare. Resection and retrieval were complete.                            Estimated blood loss was minimal.                           A 4 mm polyp was found in the sigmoid colon. The                            polyp was sessile. The polyp was removed  with a                            cold snare. Resection and retrieval were complete.                            Estimated blood loss was minimal.                           The exam was otherwise normal throughout the                            remainder of the colon. No areas of mucosal                            erythema, edema, erosions, ulceration noted on this                            study. No blood in the lower GI lumen.                           The terminal ileum appeared normal.                           Non-bleeding internal hemorrhoids were found during                            retroflexion. The hemorrhoids were medium-sized and                            Grade II (internal hemorrhoids that prolapse but                            reduce spontaneously). Complications:            No immediate complications. Estimated Blood Loss:     Estimated blood loss was minimal. Impression:               - One 2 mm polyp  in the ascending colon, removed                            with a cold snare. Resected and retrieved.                           - One 4 mm polyp in the sigmoid colon, removed with                            a cold snare. Resected and retrieved.                           - The examined portion of the ileum was normal.                           - Non-bleeding internal hemorrhoids. Recommendation:           - Patient has  a contact number available for                            emergencies. The signs and symptoms of potential                            delayed complications were discussed with the                            patient. Return to normal activities tomorrow.                            Written discharge instructions were provided to the                            patient.                           - Resume previous diet.                           - Continue present medications.                           - Await pathology results.                           - Repeat colonoscopy for surveillance based on                            pathology results.                           - Use fiber, for example Citrucel, Fibercon, Konsyl                            or Metamucil.                           - Internal hemorrhoids were noted on this study and                            may be amenable to hemorrhoid band ligation. If you                            are interested in further treatment of these                            hemorrhoids with band ligation, please contact my                            clinic to set up an appointment for evaluation and                            treatment. Harry Lindau, MD 10/06/2023 8:17:20 AM

## 2023-10-06 NOTE — Progress Notes (Signed)
 GASTROENTEROLOGY PROCEDURE H&P NOTE   Primary Care Physician: Elvira Hammersmith, MD    Reason for Procedure:   Hematochezia  Plan:    Colonoscopy  Patient is appropriate for endoscopic procedure(s) in the ambulatory ([facility]) setting.  The nature of the procedure, as well as the risks, benefits, and alternatives were carefully and thoroughly reviewed with the patient. Ample time for discussion and questions allowed. The patient understood, was satisfied, and agreed to proceed.     HPI: [name] [name] is a 73 y.o. female who presents for colonoscopy for evaluation of recent hematochezia.   Last colonoscopy was 2017 and normal.   Recent CTA normal.   Past Medical History:  Diagnosis Date   Anxiety    Cataract    Chronic insomnia 10/13/2016   Depression    Gait disorder 03/14/2014   HTN (hypertension)    Hx of cardiovascular stress test    Lexiscan  Myoview (1/16):  No ischemia, EF 68%, breast atten; Low Risk   Hyperlipidemia    Phreesia 07/18/2020   Hypertension    Phreesia 07/18/2020   Memory difficulty 03/14/2014   Meningitis due to bacteria 01/18/2014   OSA (obstructive sleep apnea) 11/30/2016   Seizures (HCC)    Sleep apnea    Phreesia 07/18/2020   TIA (transient ischemic attack)     Past Surgical History:  Procedure Laterality Date   NO PAST SURGERIES     PACEMAKER IMPLANT N/A 09/13/2023   Procedure: PACEMAKER IMPLANT;  Surgeon: Efraim Grange, MD;  Location: [facility];  Service: Cardiovascular;  Laterality: N/A;    Prior to Admission medications   Medication Sig Start Date End Date Taking? Authorizing Provider  aspirin  81 MG chewable tablet Chew 1 tablet (81 mg total) by mouth daily. 09/16/23  Yes Mealor, Augustus E, MD  carbamazepine  (TEGRETOL ) 200 MG tablet TAKE 1 AND 1/2 TABLETS BY MOUTH TWO TIMES A DAY Patient taking differently: No sig reported 01/24/23  Yes Wess Hammed, NP  Cholecalciferol (VITAMIN D -3 PO) Take 1 capsule by  mouth daily after supper.   Yes [provider]  escitalopram  (LEXAPRO ) 10 MG tablet TAKE 1 TABLET(10 MG) BY MOUTH AT BEDTIME Patient taking differently: No sig reported 08/20/23  Yes Sagardia, Isidro Margo, MD  ezetimibe  (ZETIA ) 10 MG tablet TAKE 1 TABLET(10 MG) BY MOUTH DAILY Patient taking differently: Take 10 mg by mouth daily with supper. 04/05/23  Yes Wendie Hamburg, MD  levETIRAcetam  (KEPPRA ) 750 MG tablet TAKE 2 TABLETS(1500 MG) BY MOUTH TWICE DAILY Patient taking differently: Take 1,500 mg by mouth 2 (two) times daily. 10/28/22  Yes Wess Hammed, NP  lisinopril -hydrochlorothiazide  (ZESTORETIC ) 20-25 MG tablet Take 1 tablet by mouth daily. 09/11/23  Yes Macdonald Savoy, MD  Multiple Vitamins-Minerals (MULTIVITAMIN WOMEN 50+) TABS Take 1 tablet by mouth daily after supper.   Yes [provider]  Na Sulfate-K Sulfate-Mg Sulfate concentrate (SUPREP BOWEL PREP KIT) 17.5-3.13-1.6 GM/177ML SOLN Take 1 kit (354 mLs total) by mouth as directed. 09/12/23  Yes Lillis Nuttle V, DO  rosuvastatin  (CRESTOR ) 40 MG tablet TAKE 1 TABLET(40 MG) BY MOUTH DAILY Patient taking differently: Take 40 mg by mouth daily with supper. 12/27/22  Yes Wendie Hamburg, MD  acetaminophen  (TYLENOL ) 325 MG tablet Take 650 mg by mouth at bedtime as needed for moderate pain (pain score 4-6).    [provider]  MELATONIN PO Take 2 each by mouth at bedtime as needed (relaxation). OTC Melatonin Gummies  [provider]  nitroGLYCERIN  (NITROSTAT ) 0.4 MG SL tablet Place 1 tablet (0.4 mg total) under the tongue every 5 (five) minutes as needed for chest pain. 12/21/21   Wendie Hamburg, MD  zolpidem  (AMBIEN ) 5 MG tablet Take 1 tablet (5 mg total) by mouth at bedtime as needed for sleep. Patient not taking: Reported on 10/06/2023 09/19/23   Elvira Hammersmith, MD    Current Outpatient Medications  Medication Sig Dispense Refill   aspirin  81 MG chewable tablet Chew 1  tablet (81 mg total) by mouth daily.     carbamazepine  (TEGRETOL ) 200 MG tablet TAKE 1 AND 1/2 TABLETS BY MOUTH TWO TIMES A DAY (Patient taking differently: No sig reported) 270 tablet 4   Cholecalciferol (VITAMIN D -3 PO) Take 1 capsule by mouth daily after supper.     escitalopram  (LEXAPRO ) 10 MG tablet TAKE 1 TABLET(10 MG) BY MOUTH AT BEDTIME (Patient taking differently: No sig reported) 90 tablet 1   ezetimibe  (ZETIA ) 10 MG tablet TAKE 1 TABLET(10 MG) BY MOUTH DAILY (Patient taking differently: Take 10 mg by mouth daily with supper.) 90 tablet 3   levETIRAcetam  (KEPPRA ) 750 MG tablet TAKE 2 TABLETS(1500 MG) BY MOUTH TWICE DAILY (Patient taking differently: Take 1,500 mg by mouth 2 (two) times daily.) 360 tablet 4   lisinopril -hydrochlorothiazide  (ZESTORETIC ) 20-25 MG tablet Take 1 tablet by mouth daily. 90 tablet 1   Multiple Vitamins-Minerals (MULTIVITAMIN WOMEN 50+) TABS Take 1 tablet by mouth daily after supper.     Na Sulfate-K Sulfate-Mg Sulfate concentrate (SUPREP BOWEL PREP KIT) 17.5-3.13-1.6 GM/177ML SOLN Take 1 kit (354 mLs total) by mouth as directed. 324 mL 0   rosuvastatin  (CRESTOR ) 40 MG tablet TAKE 1 TABLET(40 MG) BY MOUTH DAILY (Patient taking differently: Take 40 mg by mouth daily with supper.) 90 tablet 3   acetaminophen  (TYLENOL ) 325 MG tablet Take 650 mg by mouth at bedtime as needed for moderate pain (pain score 4-6).     MELATONIN PO Take 2 each by mouth at bedtime as needed (relaxation). OTC Melatonin Gummies     nitroGLYCERIN  (NITROSTAT ) 0.4 MG SL tablet Place 1 tablet (0.4 mg total) under the tongue every 5 (five) minutes as needed for chest pain. 25 tablet 1   zolpidem  (AMBIEN ) 5 MG tablet Take 1 tablet (5 mg total) by mouth at bedtime as needed for sleep. (Patient not taking: Reported on 10/06/2023) 15 tablet 1   Current Facility-Administered Medications  Medication Dose Route Frequency Provider Last Rate Last Admin   0.9 %  sodium chloride  infusion  500 mL Intravenous  Once Leodan Bolyard V, DO        Allergies as of 10/06/2023 - Review Complete 10/06/2023  Allergen Reaction Noted   Nsaids Other (See Comments) 09/08/2023    Family History  Problem Relation Age of Onset   Heart attack Mother 64   Stroke Mother    Hypertension Mother    Heart attack Father 60   Cerebral aneurysm Sister    Hypertension Sister    Kidney disease Sister        ESRD; HD in last year   Hypertension Brother    Hyperlipidemia Brother    Rheum arthritis Sister    Arthritis Sister    Hypertension Brother    Hyperlipidemia Brother    Hyperlipidemia Brother    Hypertension Brother     Social History   Socioeconomic History   Marital status: Single    Spouse name: Not on file   Number  of children: 1   Years of education: college   Highest education level: Some college, no degree  Occupational History   Occupation: Retired Financial planner: [employer]  Tobacco Use   Smoking status: Former    Current packs/day: 0.00    Average packs/day: 0.5 packs/day for 40.0 years (20.0 ttl pk-yrs)    Types: Cigarettes    Start date: 07/09/1971    Quit date: 07/09/2011    Years since quitting: 12.2   Smokeless tobacco: Never  Vaping Use   Vaping status: Never Used  Substance and Sexual Activity   Alcohol use: Never   Drug use: No   Sexual activity: Never    Birth control/protection: Abstinence  Other Topics Concern   Not on file  Social History Narrative   Marital status: divorced x 35 years; not dating; not interested in 2019.      Children: 1 child/son (44); 2 grandchildren; [place]      Lives:  Alone in [place] in [place].      Employment: retired 2008; Administrator, arts.      Tobacco: quit smoking in 2013.  Smoked x many years       Alcohol: none; holidays only      Exercise: none; quit exercising two years ago in 2015.        Seatbelt: 100%; no texting. Does not drive in [address]10 due to seizure activity.       ADLs: no driving in 9604-5409 due to seizure  activity; independent with ADLs.  Has cane for long distances      Advanced Directives: none; [name]/sister.  FULL CODE no prolonged measures.         Patient is single lives at home alone, has 1 child   Patient is right handed   Education level is 1 year of college   Caffeine consumption is 2 cups daily   Social Drivers of Health   Financial Resource Strain: Low Risk  (08/03/2023)   Overall Financial Resource Strain (CARDIA)    Difficulty of Paying Living Expenses: Not hard at all  Food Insecurity: No Food Insecurity (09/08/2023)   Hunger Vital Sign    Worried About Running Out of Food in the Last Year: Never true    Ran Out of Food in the Last Year: Never true  Transportation Needs: No Transportation Needs (09/08/2023)   PRAPARE - Administrator, Civil Service (Medical): No    Lack of Transportation (Non-Medical): No  Physical Activity: Inactive (08/03/2023)   Exercise Vital Sign    Days of Exercise per Week: 0 days    Minutes of Exercise per Session: 0 min  Stress: No Stress Concern Present (08/03/2023)   [employer] of Occupational Health - Occupational Stress Questionnaire    Feeling of Stress : Only a little  Social Connections: Unknown (09/08/2023)   Social Connection and Isolation Panel [NHANES]    Frequency of Communication with Friends and Family: Three times a week    Frequency of Social Gatherings with Friends and Family: Three times a week    Attends Religious Services: 1 to 4 times per year    Active Member of Clubs or Organizations: Yes    Attends Banker Meetings: More than 4 times per year    Marital Status: Patient declined  Intimate Partner Violence: Not At Risk (09/08/2023)   Humiliation, Afraid, Rape, and Kick questionnaire    Fear of Current or Ex-Partner: No    Emotionally  Abused: No    Physically Abused: No    Sexually Abused: No    Physical Exam: Vital signs in last 24 hours: @BP  (!) 125/49   Pulse 60   Temp (!) 97.2 F  (36.2 C) (Temporal)   Resp 11   Ht 5\' 5"  (1.651 m)   Wt 255 lb (115.7 kg)   SpO2 98%   BMI 42.43 kg/m  GEN: NAD EYE: Sclerae anicteric ENT: MMM CV: Non-tachycardic Pulm: CTA b/l GI: Soft, NT/ND NEURO:  Alert & Oriented x 3   Harry Lindau, DO [facility]   10/06/2023 7:35 AM

## 2023-10-06 NOTE — Telephone Encounter (Signed)
-----   Message from Annis Kinder sent at 10/06/2023  8:22 AM EDT ----- Can you please set this patient up for hemorrhoid banding with me.  Next routine available.  Thanks.

## 2023-10-06 NOTE — Progress Notes (Signed)
 To pacu, VSS. Report to [email or web address].tb

## 2023-10-07 ENCOUNTER — Telehealth: Payer: Self-pay

## 2023-10-07 NOTE — Telephone Encounter (Signed)
 Attempted to reach patient for follow up phone call. No answer, left voicemail to contact Dr. Andre Kawasaki office with any questions or concerns.

## 2023-10-11 LAB — SURGICAL PATHOLOGY

## 2023-10-12 ENCOUNTER — Ambulatory Visit: Payer: Self-pay | Admitting: Gastroenterology

## 2023-10-20 ENCOUNTER — Ambulatory Visit: Payer: Medicare Other

## 2023-10-20 VITALS — Ht 65.0 in | Wt 255.0 lb

## 2023-10-20 DIAGNOSIS — Z0000 Encounter for general adult medical examination without abnormal findings: Secondary | ICD-10-CM | POA: Diagnosis not present

## 2023-10-20 DIAGNOSIS — E119 Type 2 diabetes mellitus without complications: Secondary | ICD-10-CM

## 2023-10-20 NOTE — Patient Instructions (Signed)
 Ms. Lemire , Thank you for taking time out of your busy schedule to complete your Annual Wellness Visit with me. I enjoyed our conversation and look forward to speaking with you again next year. I, as well as your care team,  appreciate your ongoing commitment to your health goals. Please review the following plan we discussed and let me know if I can assist you in the future. Your Game plan/ To Do List    Follow up Visits: Next Medicare AWV with our clinical staff: 10/22/2024.   Have you seen your provider in the last 6 months (3 months if uncontrolled diabetes)? Yes Next Office Visit with your provider: 02/08/2024.  Clinician Recommendations:  Aim for 30 minutes of exercise or brisk walking, 6-8 glasses of water, and 5 servings of fruits and vegetables each day. You are due for a foot exam and also a kidney evaluation, which will be done during your next office visit.  I have placed a referral for you to speak with someone about getting a life alert.      This is a list of the screening recommended for you and due dates:  Health Maintenance  Topic Date Due   Complete foot exam   Never done   Yearly kidney health urinalysis for diabetes  Never done   COVID-19 Vaccine (4 - 2024-25 season) 01/02/2023   Screening for Lung Cancer  11/16/2023   Flu Shot  12/02/2023   Hemoglobin A1C  02/03/2024   Eye exam for diabetics  08/15/2024   Yearly kidney function blood test for diabetes  09/07/2024   Medicare Annual Wellness Visit  10/19/2024   Mammogram  10/25/2024   DTaP/Tdap/Td vaccine (3 - Td or Tdap) 05/27/2029   Colon Cancer Screening  10/06/2030   Pneumococcal Vaccine for age over 22  Completed   DEXA scan (bone density measurement)  Completed   Hepatitis C Screening  Completed   Zoster (Shingles) Vaccine  Completed   HPV Vaccine  Aged Out   Meningitis B Vaccine  Aged Out    Advanced directives: (Copy Requested) Please bring a copy of your health care power of attorney and living will to  the office to be added to your chart at your convenience. You can mail to [facility] [address] [place], [place] [address] or email to [email or web address] Advance Care Planning is important because it:  [x]  Makes sure you receive the medical care that is consistent with your values, goals, and preferences  [x]  It provides guidance to your family and loved ones and reduces their decisional burden about whether or not they are making the right decisions based on your wishes.  Follow the link provided in your after visit summary or read over the paperwork we have mailed to you to help you started getting your Advance Directives in place. If you need assistance in completing these, please reach out to us  so that we can help you!  See attachments for Preventive Care and Fall Prevention Tips.

## 2023-10-20 NOTE — Progress Notes (Signed)
 Subjective:   [name] [name] is a 73 y.o. who presents for a Medicare Wellness preventive visit.  As a reminder, Annual Wellness Visits don't include a physical exam, and some assessments may be limited, especially if this visit is performed virtually. We may recommend an in-person follow-up visit with your provider if needed.  Visit Complete: Virtual I connected with  [name] [name] on 10/20/23 by a audio enabled telemedicine application and verified that I am speaking with the correct person using two identifiers.  Patient Location: Home  Provider Location: Home Office  I discussed the limitations of evaluation and management by telemedicine. The patient expressed understanding and agreed to proceed.  Vital Signs: Because this visit was a virtual/telehealth visit, some criteria may be missing or patient reported. Any vitals not documented were not able to be obtained and vitals that have been documented are patient reported.  VideoDeclined- This patient declined Librarian, academic. Therefore the visit was completed with audio only.  Persons Participating in Visit: Patient.  AWV Questionnaire: No: Patient Medicare AWV questionnaire was not completed prior to this visit.  Cardiac Risk Factors include: advanced age (>51men, >55 women);Other (see comment);dyslipidemia;diabetes mellitus;hypertension;obesity (BMI >30kg/m2), Risk factor comments: Osa, TIA, CKD stage 3     Objective:    Today's Vitals   10/20/23 1056  Weight: 255 lb (115.7 kg)  Height: 5' 5 (1.651 m)   Body mass index is 42.43 kg/m.     10/20/2023   11:04 AM 09/13/2023    1:31 PM 09/07/2023    1:59 PM 12/30/2022   12:38 PM 11/16/2022    2:15 PM 10/14/2022   11:37 AM 10/12/2021    1:19 PM  Advanced Directives  Does Patient Have a Medical Advance Directive? No No No No No No Yes  Type of Tax inspector;Living will  Copy of Healthcare Power  of Attorney in Chart?       No - copy requested  Would patient like information on creating a medical advance directive?  No - Patient declined No - Patient declined  No - Patient declined No - Patient declined     Current Medications (verified) Outpatient Encounter Medications as of 10/20/2023  Medication Sig   acetaminophen  (TYLENOL ) 325 MG tablet Take 650 mg by mouth at bedtime as needed for moderate pain (pain score 4-6).   aspirin  81 MG chewable tablet Chew 1 tablet (81 mg total) by mouth daily.   carbamazepine  (TEGRETOL ) 200 MG tablet TAKE 1 AND 1/2 TABLETS BY MOUTH TWO TIMES A DAY (Patient taking differently: No sig reported)   Cholecalciferol (VITAMIN D -3 PO) Take 1 capsule by mouth daily after supper.   escitalopram  (LEXAPRO ) 10 MG tablet TAKE 1 TABLET(10 MG) BY MOUTH AT BEDTIME (Patient taking differently: No sig reported)   ezetimibe  (ZETIA ) 10 MG tablet TAKE 1 TABLET(10 MG) BY MOUTH DAILY (Patient taking differently: Take 10 mg by mouth daily with supper.)   levETIRAcetam  (KEPPRA ) 750 MG tablet TAKE 2 TABLETS(1500 MG) BY MOUTH TWICE DAILY (Patient taking differently: Take 1,500 mg by mouth 2 (two) times daily.)   lisinopril -hydrochlorothiazide  (ZESTORETIC ) 20-25 MG tablet Take 1 tablet by mouth daily.   MELATONIN PO Take 2 each by mouth at bedtime as needed (relaxation). OTC Melatonin Gummies   Multiple Vitamins-Minerals (MULTIVITAMIN WOMEN 50+) TABS Take 1 tablet by mouth daily after supper.   Na Sulfate-K Sulfate-Mg Sulfate concentrate (SUPREP BOWEL PREP KIT) 17.5-3.13-1.6 GM/177ML SOLN  Take 1 kit (354 mLs total) by mouth as directed.   nitroGLYCERIN  (NITROSTAT ) 0.4 MG SL tablet Place 1 tablet (0.4 mg total) under the tongue every 5 (five) minutes as needed for chest pain.   rosuvastatin  (CRESTOR ) 40 MG tablet TAKE 1 TABLET(40 MG) BY MOUTH DAILY (Patient taking differently: Take 40 mg by mouth daily with supper.)   zolpidem  (AMBIEN ) 5 MG tablet Take 1 tablet (5 mg total) by mouth at  bedtime as needed for sleep. (Patient not taking: Reported on 10/20/2023)   No facility-administered encounter medications on file as of 10/20/2023.    Allergies (verified) Nsaids   History: Past Medical History:  Diagnosis Date   Anxiety    Cataract    Chronic insomnia 10/13/2016   Depression    Gait disorder 03/14/2014   HTN (hypertension)    Hx of cardiovascular stress test    Lexiscan  Myoview (1/16):  No ischemia, EF 68%, breast atten; Low Risk   Hyperlipidemia    Phreesia 07/18/2020   Hypertension    Phreesia 07/18/2020   Memory difficulty 03/14/2014   Meningitis due to bacteria 01/18/2014   OSA (obstructive sleep apnea) 11/30/2016   Seizures (HCC)    Sleep apnea    Phreesia 07/18/2020   TIA (transient ischemic attack)    Past Surgical History:  Procedure Laterality Date   NO PAST SURGERIES     PACEMAKER IMPLANT N/A 09/13/2023   Procedure: PACEMAKER IMPLANT;  Surgeon: Efraim Grange, MD;  Location: [facility];  Service: Cardiovascular;  Laterality: N/A;   Family History  Problem Relation Age of Onset   Heart attack Mother 18   Stroke Mother    Hypertension Mother    Heart attack Father 20   Cerebral aneurysm Sister    Hypertension Sister    Kidney disease Sister        ESRD; HD in last year   Hypertension Brother    Hyperlipidemia Brother    Rheum arthritis Sister    Arthritis Sister    Hypertension Brother    Hyperlipidemia Brother    Hyperlipidemia Brother    Hypertension Brother    Social History   Socioeconomic History   Marital status: Single    Spouse name: Not on file   Number of children: 1   Years of education: college   Highest education level: Some college, no degree  Occupational History   Occupation: Retired Financial planner: [employer]  Tobacco Use   Smoking status: Former    Current packs/day: 0.00    Average packs/day: 0.5 packs/day for 40.0 years (20.0 ttl pk-yrs)    Types: Cigarettes    Start date: 07/09/1971    Quit date:  07/09/2011    Years since quitting: 12.2   Smokeless tobacco: Never  Vaping Use   Vaping status: Never Used  Substance and Sexual Activity   Alcohol use: Never   Drug use: No   Sexual activity: Never    Birth control/protection: Abstinence  Other Topics Concern   Not on file  Social History Narrative   Marital status: divorced x 35 years; not dating; not interested in 2019.      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      Tobacco: quit smoking in 2013.  Smoked x many years       Alcohol: none; holidays  only      Exercise: none; quit exercising two years ago in 2015.        Seatbelt: 100%; no texting. Does not drive in [address]10 due to seizure activity.       ADLs: no driving in 9604-5409 due to seizure activity; independent with ADLs.  Has cane for long distances      Advanced Directives: none; [name]/sister.  FULL CODE no prolonged measures.         Patient is single lives at home alone, has 1 child   Patient is right handed   Education level is 1 year of college   Caffeine consumption is 2 cups daily   Social Drivers of Health   Financial Resource Strain: Low Risk  (08/03/2023)   Overall Financial Resource Strain (CARDIA)    Difficulty of Paying Living Expenses: Not hard at all  Food Insecurity: No Food Insecurity (09/08/2023)   Hunger Vital Sign    Worried About Running Out of Food in the Last Year: Never true    Ran Out of Food in the Last Year: Never true  Transportation Needs: No Transportation Needs (10/20/2023)   PRAPARE - Administrator, Civil Service (Medical): No    Lack of Transportation (Non-Medical): No  Physical Activity: Inactive (10/20/2023)   Exercise Vital Sign    Days of Exercise per Week: 0 days    Minutes of Exercise per Session: 0 min  Stress: No Stress Concern Present (10/20/2023)   [employer] of  Occupational Health - Occupational Stress Questionnaire    Feeling of Stress: Not at all  Social Connections: Moderately Isolated (10/20/2023)   Social Connection and Isolation Panel    Frequency of Communication with Friends and Family: More than three times a week    Frequency of Social Gatherings with Friends and Family: Once a week    Attends Religious Services: More than 4 times per year    Active Member of [employer] or Organizations: No    Attends Engineer, structural: Never    Marital Status: Divorced    Tobacco Counseling Counseling given: Not Answered    Clinical Intake:  Pre-visit preparation completed: Yes  Pain : No/denies pain     BMI - recorded: 42.43 Nutritional Status: BMI > 30  Obese Nutritional Risks: None Diabetes: Yes CBG done?: No Did pt. bring in CBG monitor from home?: No  Lab Results  Component Value Date   HGBA1C 6.5 08/04/2023   HGBA1C 6.4 02/03/2023   HGBA1C 6.4 08/04/2022     How often do you need to have someone help you when you read instructions, pamphlets, or other written materials from your doctor or pharmacy?: 1 - Never  Interpreter Needed?: No  Information entered by :: Osamah Schmader, RMA  Activities of Daily Living    10/20/2023   10:58 AM 09/08/2023   12:32 AM  In your present state of health, do you have any difficulty performing the following activities:  Hearing? 1   Comment lt ear issues   Vision? 0   Difficulty concentrating or making decisions? 0   Walking or climbing stairs? 0   Dressing or bathing? 0   Doing errands, shopping? 0 0  Preparing Food and eating ? N   Using the Toilet? N   In the past six months, have you accidently leaked urine? N   Do you have problems with loss of bowel control? N   Managing your Medications? N   Managing your  Finances? N   Housekeeping or managing your Housekeeping? N     Patient Care Team: Elvira Hammersmith, MD as PCP - General (Internal Medicine) Wendie Hamburg, MD as PCP - Cardiology (Cardiology) Doroteo Gasmen, DDS (Dentistry) Debbra Fairy, MD as Attending Physician (Neurology) Brian Campanile, MD (Inactive) as Consulting Physician (Neurology) Velton Gibbon, OD as Consulting Physician (Optometry)  I have updated your Care Teams any recent Medical Services you may have received from other providers in the past year.     Assessment:   This is a routine wellness examination for [name] [name].  Hearing/Vision screen Hearing Screening - Comments:: Denies hearing difficulties   Vision Screening - Comments:: Wears eyeglasses for distance/[place]   Goals Addressed             This Visit's Progress    Client understands the importance of follow-up with providers by attending scheduled visits.   On track      Depression Screen     10/20/2023   11:10 AM 09/19/2023    2:44 PM 08/04/2023   10:21 AM 02/03/2023    9:44 AM 01/05/2023    2:22 PM 10/14/2022   11:43 AM 08/04/2022   11:27 AM  PHQ 2/9 Scores  PHQ - 2 Score 1 3 3 3 2  0 0  PHQ- 9 Score 1 11 11 13 6  0     Fall Risk     10/20/2023   11:06 AM 09/19/2023    2:44 PM 08/04/2023   10:21 AM 02/03/2023    9:44 AM 01/05/2023    2:23 PM  Fall Risk   Falls in the past year? 0 0 1 0 0  Number falls in past yr: 0 0 0 0 0  Injury with Fall? 0 0 0 0 0  Risk for fall due to : Impaired balance/gait No Fall Risks No Fall Risks No Fall Risks Impaired mobility;Impaired balance/gait  Follow up Falls evaluation completed;Falls prevention discussed Falls evaluation completed Falls evaluation completed Falls evaluation completed Falls evaluation completed    MEDICARE RISK AT HOME:  Medicare Risk at Home Any stairs in or around the home?: Yes If so, are there any without handrails?: No Home free of loose throw rugs in walkways, pet beds, electrical cords, etc?: Yes Adequate lighting in your home to reduce risk of falls?: Yes Life alert?: No (would like to get help getting one.) Use of a cane,  walker or w/c?: Yes (cane sometimes.) Grab bars in the bathroom?: Yes Shower chair or bench in shower?: Yes Elevated toilet seat or a handicapped toilet?: Yes  TIMED UP AND GO:  Was the test performed?  No  Cognitive Function: Declined/Normal: No cognitive concerns noted by patient or family. Patient alert, oriented, able to answer questions appropriately and recall recent events. No signs of memory loss or confusion.    08/26/2016    2:03 PM 07/26/2014    8:37 AM  MMSE - Mini Mental State Exam  Orientation to time 5  5   Orientation to Place 5  5   Registration 3  3   Attention/ Calculation 5  5   Recall 3  3   Language- name 2 objects 2  2   Language- repeat 1 1  Language- follow 3 step command 3  3   Language- read & follow direction 1  1   Write a sentence 1  1   Copy design 1  1   Total score 30  30  Data saved with a previous flowsheet row definition        10/14/2022   11:39 AM 05/28/2019    9:07 AM 05/18/2018    8:57 AM 05/18/2018    8:51 AM 05/17/2017    9:05 AM  6CIT Screen  What Year? 0 points 0 points 0 points 0 points 0 points  What month? 0 points 0 points 0 points 0 points 0 points  What time? 0 points 0 points 0 points 0 points 0 points  Count back from 20 0 points 0 points 0 points 0 points 0 points  Months in reverse 0 points 0 points 0 points 0 points 0 points  Repeat phrase 0 points 0 points 0 points  2 points  Total Score 0 points 0 points 0 points  2 points    Immunizations Immunization History  Administered Date(s) Administered   Influenza, High Dose Seasonal PF 02/03/2018, 12/28/2018   Influenza-Unspecified 02/20/2013, 01/18/2014, 01/09/2016, 02/15/2017, 01/10/2020, 01/16/2022   Meningococcal Conjugate 10/23/2014   PFIZER(Purple Top)SARS-COV-2 Vaccination 05/25/2019, 06/15/2019, 02/07/2020   Pneumococcal Conjugate-13 04/17/2014   Pneumococcal Polysaccharide-23 04/18/2013, 05/26/2018   Tdap 03/08/2007, 05/28/2019   Zoster  Recombinant(Shingrix) 12/28/2017, 03/03/2018   Zoster, Live 05/03/2010    Screening Tests Health Maintenance  Topic Date Due   FOOT EXAM  Never done   Diabetic kidney evaluation - Urine ACR  Never done   COVID-19 Vaccine (4 - 2024-25 season) 01/02/2023   Lung Cancer Screening  11/16/2023   INFLUENZA VACCINE  12/02/2023   HEMOGLOBIN A1C  02/03/2024   OPHTHALMOLOGY EXAM  08/15/2024   Diabetic kidney evaluation - eGFR measurement  09/07/2024   Medicare Annual Wellness (AWV)  10/19/2024   MAMMOGRAM  10/25/2024   DTaP/Tdap/Td (3 - Td or Tdap) 05/27/2029   Colonoscopy  10/06/2030   Pneumococcal Vaccine: 50+ Years  Completed   DEXA SCAN  Completed   Hepatitis C Screening  Completed   Zoster Vaccines- Shingrix  Completed   HPV VACCINES  Aged Out   Meningococcal B Vaccine  Aged Out    Health Maintenance  Health Maintenance Due  Topic Date Due   FOOT EXAM  Never done   Diabetic kidney evaluation - Urine ACR  Never done   COVID-19 Vaccine (4 - 2024-25 season) 01/02/2023   Lung Cancer Screening  11/16/2023   Health Maintenance Items Addressed: Diabetic Foot Exam recommended, Labs Ordered: UACR, See Nurse Notes at the end of this note  Additional Screening:  Vision Screening: Recommended annual ophthalmology exams for early detection of glaucoma and other disorders of the eye. Would you like a referral to an eye doctor? No    Dental Screening: Recommended annual dental exams for proper oral hygiene  Community Resource Referral / Chronic Care Management: CRR required this visit?  Yes   CCM required this visit?  No   Plan:    I have personally reviewed and noted the following in the patient's chart:   Medical and social history Use of alcohol, tobacco or illicit drugs  Current medications and supplements including opioid prescriptions. Patient is not currently taking opioid prescriptions. Functional ability and status Nutritional status Physical activity Advanced  directives List of other physicians Hospitalizations, surgeries, and ER visits in previous 12 months Vitals Screenings to include cognitive, depression, and falls Referrals and appointments  In addition, I have reviewed and discussed with patient certain preventive protocols, quality metrics, and best practice recommendations. A written personalized care plan for preventive services as well as general preventive health  recommendations were provided to patient.  [name] [name], CMA   10/20/2023   After Visit Summary: (MyChart) Due to this being a telephonic visit, the after visit summary with patients personalized plan was offered to patient via MyChart   Notes: Patient is due for a foot exam and a UACR, which can be done at her next office visit.  Patient is concerned about a diginose's of CKD stage3.   Patient is eligible for a lung cancer screening, however she has had a pacemaker placed on 09/13/23.  Patient stated that she is not taking the Ambien  5 mg due to reading side effects of it.  I have placed a community resource referral for patient to get a life alert.

## 2023-10-26 ENCOUNTER — Telehealth: Payer: Self-pay | Admitting: Neurology

## 2023-10-26 NOTE — Telephone Encounter (Signed)
 Faxed last ov note to Fax # [phone]

## 2023-10-26 NOTE — Telephone Encounter (Signed)
 [place] [facility]  called  to request Pt  last office note be faxed to office to received CPAP Supplies   Fax # [phone]

## 2023-10-27 ENCOUNTER — Ambulatory Visit: Payer: Medicare Other | Admitting: Neurology

## 2023-10-31 ENCOUNTER — Telehealth: Payer: Self-pay

## 2023-10-31 ENCOUNTER — Ambulatory Visit (INDEPENDENT_AMBULATORY_CARE_PROVIDER_SITE_OTHER)

## 2023-10-31 DIAGNOSIS — R002 Palpitations: Secondary | ICD-10-CM

## 2023-10-31 LAB — CUP PACEART REMOTE DEVICE CHECK
Battery Remaining Longevity: 71 mo
Battery Remaining Percentage: 95.5 %
Battery Voltage: 3.01 V
Brady Statistic AP VP Percent: 2.4 %
Brady Statistic AP VS Percent: 92 %
Brady Statistic AS VP Percent: 1 %
Brady Statistic AS VS Percent: 5.6 %
Brady Statistic RA Percent Paced: 93 %
Brady Statistic RV Percent Paced: 2.5 %
Date Time Interrogation Session: 20250630040226
Implantable Lead Connection Status: 753985
Implantable Lead Connection Status: 753985
Implantable Lead Implant Date: 20250513
Implantable Lead Implant Date: 20250513
Implantable Lead Location: 753859
Implantable Lead Location: 753860
Implantable Pulse Generator Implant Date: 20250513
Lead Channel Impedance Value: 430 Ohm
Lead Channel Impedance Value: 530 Ohm
Lead Channel Pacing Threshold Amplitude: 0.75 V
Lead Channel Pacing Threshold Amplitude: 0.75 V
Lead Channel Pacing Threshold Pulse Width: 0.5 ms
Lead Channel Pacing Threshold Pulse Width: 0.5 ms
Lead Channel Sensing Intrinsic Amplitude: 5 mV
Lead Channel Sensing Intrinsic Amplitude: 7 mV
Lead Channel Setting Pacing Amplitude: 3.5 V
Lead Channel Setting Pacing Amplitude: 3.5 V
Lead Channel Setting Pacing Pulse Width: 0.5 ms
Lead Channel Setting Sensing Sensitivity: 0.5 mV
Pulse Gen Model: 2272
Pulse Gen Serial Number: 8269852

## 2023-10-31 NOTE — Progress Notes (Signed)
   Telephone encounter was:  Successful.  Complex Care Management Note Care Guide Note  10/31/2023 Name: [name] [name] MRN: [id number] DOB: January 10, 1951  [name] [name] is a 73 y.o. year old female who is a primary care patient of Sagardia, Emil Schanz, MD . The community resource team was consulted for assistance with Life alert  SDOH screenings and interventions completed:  No        Care guide performed the following interventions: Patient provided with information about care guide support team and interviewed to confirm resource needs.Pt is looking for information for life alert or life line. Pt will be reaching out to her insurance company to see if they have a program. I will be following up in a few days with other resources   Follow Up Plan:  Care guide will follow up with patient by phone over the next 72 hours  Encounter Outcome:  Patient Visit Completed  SIG   Jon Colt [facility]  [facility] Guide, Phone: [phone] Fax: [phone] Website: [email or web address]

## 2023-11-02 ENCOUNTER — Encounter: Payer: Self-pay | Admitting: Neurology

## 2023-11-02 ENCOUNTER — Ambulatory Visit: Admitting: Neurology

## 2023-11-02 VITALS — BP 108/72 | HR 63 | Ht 65.0 in | Wt 256.0 lb

## 2023-11-02 DIAGNOSIS — G4733 Obstructive sleep apnea (adult) (pediatric): Secondary | ICD-10-CM

## 2023-11-02 DIAGNOSIS — G40909 Epilepsy, unspecified, not intractable, without status epilepticus: Secondary | ICD-10-CM | POA: Diagnosis not present

## 2023-11-02 NOTE — Patient Instructions (Signed)
 Order home sleep study to qualify for new CPAP machine.  Continue seizure medication.  Check labs today.  Please call for seizures.  Follow-up in 1 year.  Thanks!

## 2023-11-02 NOTE — Progress Notes (Signed)
 PATIENT: [name] [name] DOB: 09/16/1950  REASON FOR VISIT: follow up seizures, CPAP HISTORY FROM: patient PRIMARY NEUROLOGIST: Dr. Buck  HISTORY OF PRESENT ILLNESS: Today 11/02/23 Last visit, I ordered HST/PSG for new machine. Her current CPAP was issued in July 2018. Her CPAP irritates her face and head, feels like the straps don't fit right. Uses FFM.  Has chronic insomnia, often times lays there with CPAP on. Download shows 83% usage, 13 cm water, AHI 1.3, leak 21.5. had pacemaker placed May 2025. Is afraid to go to sleep without CPAP, so when she sleeps without it, she just lays there. No seizures. Remains on carbamazepine  and Keppra .   10/28/22 SS: No seizures reported. Remains on carbamazepine  200 mg 1.5 tablets twice daily, Keppra  750 mg 2 tablets twice daily. Now seeing cardiology. Was having fatigue, palpations, heart racing. Placed on Zetia , feeling better. Uses CPAP diligently, but it is uncomfortable. Has caused rashes various places. Often times she takes it off early morning when she wakes up. We think her machine is more than 73 years old. We think since 2018. Using full face mask. Really isn't sure how much help CPAP is doing but she is so used to it, hard to compare, she doesn't miss nights. She is scared to sleep without it. She would be thrilled if she didn't have sleep apnea. In 2018, spilt night sleep study 1. This study demonstrates severe obstructive sleep apnea, with a total AHI of 48.3/hour, REM AHI of 110.9/hour, supine AHI of and O2 nadir of 58%. ESS 15. Many nights she tosses and turns, has had insomnia her entire adult life. Cut out caffeine. Is on oral vitamin D .  Official CPAP report is attached, overall nightly use greater than 4 hours 97%.  Average usage 6 hours 39 minutes.  Set pressure 13 cm water.  Leak 13.9.  AHI 1.5.  10/29/21 SS: [name] [name] is here today. No seizures. Remains on Keppra  and carbamazepine . DM doing well. Last A1C was 6.5. Using CPAP.  Having hard time getting mask, straps adjusted. Right now not an issue.  Is faithful to use CPAP nightly review of download of the last 30 days shows 100% compliance, greater than 4 hours 29 days.  Average usage 7 hours 8 minutes.  Set pressure 13.  Leak in the 95th percentile 14.2, AHI 1.6.  Uses full facemask.  Update 10/29/20 SS: [name] [name] is a 73 year old female with history of seizures (staring episodes).  On carbamazepine  and Keppra .  Last seizure event was around November 2021.  Carbamazepine  was increased to 300 mg twice a day.  EEG was normal in January 2022. Diagnosed with Type 2 DM, A1C 6.6, started metformin , has lost 20 lbs.   CPAP download is listed below, overall shows excellent compliance, AHI is well treated at 1.0, leak in the 95th percentile is 15.1. Small issue with humidification tub having leaks, sent replacement parts 3 times, all with cracks, wants to go without using humidification at all. Denies any dry mouth. Uses full face mask. Missed 1 night when she was out of town. 2 nights she couldn't wear it well because of 2 molars, keeps having to adjust the mask from this healing. Sometimes seems like a leak.       REVIEW OF SYSTEMS: Out of a complete 14 system review of symptoms, the patient complains only of the following symptoms, and all other reviewed systems are negative.  See HPI  ALLERGIES: Allergies  Allergen Reactions   Nsaids Other (  See Comments)    CKD stage 3     HOME MEDICATIONS: Outpatient Medications Prior to Visit  Medication Sig Dispense Refill   acetaminophen  (TYLENOL ) 325 MG tablet Take 650 mg by mouth at bedtime as needed for moderate pain (pain score 4-6).     aspirin  81 MG chewable tablet Chew 1 tablet (81 mg total) by mouth daily.     carbamazepine  (TEGRETOL ) 200 MG tablet TAKE 1 AND 1/2 TABLETS BY MOUTH TWO TIMES A DAY (Patient taking differently: No sig reported) 270 tablet 4   Cholecalciferol (VITAMIN D -3 PO) Take 1 capsule by mouth daily  after supper.     escitalopram  (LEXAPRO ) 10 MG tablet TAKE 1 TABLET(10 MG) BY MOUTH AT BEDTIME (Patient taking differently: No sig reported) 90 tablet 1   ezetimibe  (ZETIA ) 10 MG tablet TAKE 1 TABLET(10 MG) BY MOUTH DAILY (Patient taking differently: Take 10 mg by mouth daily with supper.) 90 tablet 3   levETIRAcetam  (KEPPRA ) 750 MG tablet TAKE 2 TABLETS(1500 MG) BY MOUTH TWICE DAILY (Patient taking differently: Take 1,500 mg by mouth 2 (two) times daily.) 360 tablet 4   lisinopril -hydrochlorothiazide  (ZESTORETIC ) 20-25 MG tablet Take 1 tablet by mouth daily. 90 tablet 1   Multiple Vitamins-Minerals (MULTIVITAMIN WOMEN 50+) TABS Take 1 tablet by mouth daily after supper.     nitroGLYCERIN  (NITROSTAT ) 0.4 MG SL tablet Place 1 tablet (0.4 mg total) under the tongue every 5 (five) minutes as needed for chest pain. 25 tablet 1   rosuvastatin  (CRESTOR ) 40 MG tablet TAKE 1 TABLET(40 MG) BY MOUTH DAILY (Patient taking differently: Take 40 mg by mouth daily with supper.) 90 tablet 3   MELATONIN PO Take 2 each by mouth at bedtime as needed (relaxation). OTC Melatonin Gummies     Na Sulfate-K Sulfate-Mg Sulfate concentrate (SUPREP BOWEL PREP KIT) 17.5-3.13-1.6 GM/177ML SOLN Take 1 kit (354 mLs total) by mouth as directed. 324 mL 0   zolpidem  (AMBIEN ) 5 MG tablet Take 1 tablet (5 mg total) by mouth at bedtime as needed for sleep. (Patient not taking: Reported on 10/20/2023) 15 tablet 1   No facility-administered medications prior to visit.    PAST MEDICAL HISTORY: Past Medical History:  Diagnosis Date   Anxiety    Cataract    Chronic insomnia 10/13/2016   Depression    Gait disorder 03/14/2014   HTN (hypertension)    Hx of cardiovascular stress test    Lexiscan  Myoview (1/16):  No ischemia, EF 68%, breast atten; Low Risk   Hyperlipidemia    Phreesia 07/18/2020   Hypertension    Phreesia 07/18/2020   Memory difficulty 03/14/2014   Meningitis due to bacteria 01/18/2014   OSA (obstructive sleep  apnea) 11/30/2016   Seizures (HCC)    Sleep apnea    Phreesia 07/18/2020   TIA (transient ischemic attack)     PAST SURGICAL HISTORY: Past Surgical History:  Procedure Laterality Date   NO PAST SURGERIES     PACEMAKER IMPLANT N/A 09/13/2023   Procedure: PACEMAKER IMPLANT;  Surgeon: Nancey Eulas BRAVO, MD;  Location: [facility];  Service: Cardiovascular;  Laterality: N/A;    FAMILY HISTORY: Family History  Problem Relation Age of Onset   Heart attack Mother 47   Stroke Mother    Hypertension Mother    Heart attack Father 36   Cerebral aneurysm Sister    Hypertension Sister    Kidney disease Sister        ESRD; HD in last year   Hypertension  Brother    Hyperlipidemia Brother    Rheum arthritis Sister    Arthritis Sister    Hypertension Brother    Hyperlipidemia Brother    Hyperlipidemia Brother    Hypertension Brother     SOCIAL HISTORY: Social History   Socioeconomic History   Marital status: Single    Spouse name: Not on file   Number of children: 1   Years of education: college   Highest education level: Some college, no degree  Occupational History   Occupation: Retired Financial planner: [employer]  Tobacco Use   Smoking status: Former    Current packs/day: 0.00    Average packs/day: 0.5 packs/day for 40.0 years (20.0 ttl pk-yrs)    Types: Cigarettes    Start date: 07/09/1971    Quit date: 07/09/2011    Years since quitting: 12.3   Smokeless tobacco: Never  Vaping Use   Vaping status: Never Used  Substance and Sexual Activity   Alcohol use: Never   Drug use: No   Sexual activity: Never    Birth control/protection: Abstinence  Other Topics Concern   Not on file  Social History Narrative   Marital status: divorced x 35 years; not dating; not interested in 2019.      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      Tobacco: quit  smoking in 2013.  Smoked x many years       Alcohol: none; holidays only      Exercise: none; quit exercising two years ago in 2015.        Seatbelt: 100%; no texting. Does not drive in 7980 due to seizure activity.       ADLs: no driving in 7981[place]7980 due to seizure activity; independent with ADLs.  Has cane for long distances      Advanced Directives: none; [name]/sister.  FULL CODE no prolonged measures.         Patient is single lives at home alone, has 1 child   Patient is right handed   Education level is 1 year of college   Caffeine consumption is 2 cups daily   Social Drivers of Health   Financial Resource Strain: Low Risk  (10/20/2023)   Overall Financial Resource Strain (CARDIA)    Difficulty of Paying Living Expenses: Not hard at all  Food Insecurity: No Food Insecurity (10/20/2023)   Hunger Vital Sign    Worried About Running Out of Food in the Last Year: Never true    Ran Out of Food in the Last Year: Never true  Transportation Needs: No Transportation Needs (10/20/2023)   PRAPARE - Administrator, Civil Service (Medical): No    Lack of Transportation (Non-Medical): No  Physical Activity: Inactive (10/20/2023)   Exercise Vital Sign    Days of Exercise per Week: 0 days    Minutes of Exercise per Session: 0 min  Stress: No Stress Concern Present (10/20/2023)   [employer] of Occupational Health - Occupational Stress Questionnaire    Feeling of Stress: Not at all  Social Connections: Moderately Isolated (10/20/2023)   Social Connection and Isolation Panel    Frequency of Communication with Friends and Family: More than three times a week    Frequency of Social Gatherings with Friends and Family: Once a week    Attends Religious Services: More  than 4 times per year    Active Member of Clubs or Organizations: No    Attends Banker Meetings: Never    Marital Status: Divorced  Catering manager Violence: Patient Unable To Answer (10/20/2023)    Humiliation, Afraid, Rape, and Kick questionnaire    Fear of Current or Ex-Partner: Patient unable to answer    Emotionally Abused: Patient unable to answer    Physically Abused: Patient unable to answer    Sexually Abused: Patient unable to answer   PHYSICAL EXAM  Vitals:   11/02/23 1427  BP: 108/72  Pulse: 63  Weight: 256 lb (116.1 kg)  Height: 5' 5 (1.651 m)    Body mass index is 42.6 kg/m.  Generalized: Well developed, in no acute distress   Neurological examination  Mentation: Alert oriented to time, place, history taking. Follows all commands speech and language fluent Cranial nerve II-XII: Pupils were equal round reactive to light. Extraocular movements were full, visual field were full on confrontational test. Facial sensation and strength were normal. Head turning and shoulder shrug  were normal and symmetric. Motor: The motor testing reveals 5 over 5 strength of all 4 extremities. Good symmetric motor tone is noted throughout.  Sensory: Sensory testing is intact to soft touch on all 4 extremities. No evidence of extinction is noted.  Coordination: Cerebellar testing reveals good finger-nose-finger and heel-to-shin bilaterally.  Gait and station: Gait is normal.  Reflexes: Deep tendon reflexes are symmetric but decreased throughout  DIAGNOSTIC DATA (LABS, IMAGING, TESTING) - I reviewed patient records, labs, notes, testing and imaging myself where available.  Lab Results  Component Value Date   WBC 2.7 (L) 09/08/2023   HGB 9.9 (L) 09/09/2023   HCT 31.8 (L) 09/09/2023   MCV 84.4 09/08/2023   PLT 155 09/08/2023      Component Value Date/Time   NA 139 09/08/2023 0404   NA 142 08/26/2023 1058   K 4.0 09/08/2023 0404   CL 106 09/08/2023 0404   CO2 27 09/08/2023 0404   GLUCOSE 97 09/08/2023 0404   BUN 22 09/08/2023 0404   BUN 27 08/26/2023 1058   CREATININE 1.06 (H) 09/08/2023 0404   CREATININE 0.89 10/28/2015 1004   CALCIUM  8.8 (L) 09/08/2023 0404   PROT  6.0 (L) 09/07/2023 1511   PROT 5.9 (L) 10/28/2022 1132   ALBUMIN 3.9 09/07/2023 1511   ALBUMIN 4.0 10/28/2022 1132   AST 17 09/07/2023 1511   ALT 14 09/07/2023 1511   ALKPHOS 109 09/07/2023 1511   BILITOT <0.2 09/07/2023 1511   BILITOT <0.2 10/28/2022 1132   GFRNONAA 56 (L) 09/08/2023 0404   GFRNONAA 79 10/17/2013 1327   GFRAA 81 04/14/2020 1609   GFRAA >89 10/17/2013 1327   Lab Results  Component Value Date   CHOL 179 08/04/2023   HDL 80.50 08/04/2023   LDLCALC 82 08/04/2023   TRIG 83.0 08/04/2023   CHOLHDL 2 08/04/2023   Lab Results  Component Value Date   HGBA1C 6.5 08/04/2023   Lab Results  Component Value Date   VITAMINB12 388 01/05/2023   Lab Results  Component Value Date   TSH 2.16 08/04/2023   ASSESSMENT AND PLAN 73 y.o. year old female  1.  Episodes of transient alteration of awareness, probable seizures -Doing well, last seizure spell in November 2021 -Continue current dose of Keppra  and carbamazepine , does not want me to send refills due to multiple bottles on hand at home -Recheck labs today  2.  OSA on CPAP -Reevaluate  OSA with HST -PSG in 2018 showed severe OSA with total AHI 48.3/hour, REM AHI 110.9/hour -Has good compliance with current CPAP, current CPAP was issued in 2018.  She finds the mask and tubing to be cumbersome. -She is currently using CPAP with a set pressure of 13 cm water, EPR off.  Leak 21.5, AHI 1.3  Lauraine Born, [place], [place] 11/02/2023, 3:34 PM [facility] [address], [address] [place], [place] [address] [phone]

## 2023-11-03 ENCOUNTER — Telehealth: Payer: Self-pay

## 2023-11-03 NOTE — Progress Notes (Signed)
   Telephone encounter was:  Successful.  Complex Care Management Note Care Guide Note  11/03/2023 Name: [name] [name] MRN: [id number] DOB: [birth year]  [name] [name] is a 73 y.o. year old female who is a primary care patient of Sagardia, Emil Schanz, MD . The community resource team was consulted for assistance with Life line   SDOH screenings and interventions completed:  No        Care guide performed the following interventions: Patient provided with information about care guide support team and interviewed to confirm resource needs.  Follow Up Plan:  Care guide will follow up with patient by phone over the next week  Encounter Outcome:  Patient Request to Call Back   Jon Colt [facility]  [facility] Guide, Phone: [phone] Fax: [phone] Website: [email or web address]

## 2023-11-04 LAB — BASIC METABOLIC PANEL WITH GFR
BUN/Creatinine Ratio: 28 (ref 12–28)
BUN: 30 mg/dL — ABNORMAL HIGH (ref 8–27)
CO2: 24 mmol/L (ref 20–29)
Calcium: 9 mg/dL (ref 8.7–10.3)
Chloride: 101 mmol/L (ref 96–106)
Creatinine, Ser: 1.08 mg/dL — ABNORMAL HIGH (ref 0.57–1.00)
Glucose: 108 mg/dL — ABNORMAL HIGH (ref 70–99)
Potassium: 3.9 mmol/L (ref 3.5–5.2)
Sodium: 142 mmol/L (ref 134–144)
eGFR: 55 mL/min/1.73 — ABNORMAL LOW (ref >59)

## 2023-11-04 LAB — LEVETIRACETAM LEVEL: Levetiracetam Lvl: 57 ug/mL — ABNORMAL HIGH (ref 10.0–40.0)

## 2023-11-04 LAB — CARBAMAZEPINE LEVEL, TOTAL: Carbamazepine (Tegretol), S: 10.2 ug/mL (ref 4.0–12.0)

## 2023-11-06 ENCOUNTER — Ambulatory Visit: Payer: Self-pay | Admitting: Cardiovascular Disease

## 2023-11-07 ENCOUNTER — Ambulatory Visit: Payer: Self-pay | Admitting: Neurology

## 2023-11-08 ENCOUNTER — Ambulatory Visit (INDEPENDENT_AMBULATORY_CARE_PROVIDER_SITE_OTHER): Admitting: Neurology

## 2023-11-08 DIAGNOSIS — G4733 Obstructive sleep apnea (adult) (pediatric): Secondary | ICD-10-CM

## 2023-11-08 DIAGNOSIS — G4734 Idiopathic sleep related nonobstructive alveolar hypoventilation: Secondary | ICD-10-CM

## 2023-11-08 DIAGNOSIS — G4731 Primary central sleep apnea: Secondary | ICD-10-CM

## 2023-11-09 ENCOUNTER — Telehealth: Payer: Self-pay | Admitting: Neurology

## 2023-11-09 DIAGNOSIS — G4733 Obstructive sleep apnea (adult) (pediatric): Secondary | ICD-10-CM

## 2023-11-09 NOTE — Telephone Encounter (Signed)
 Please call, HST 11/08/2023 showed severe OSA with total AHI 30.8/hour and O2 nadir of 78% with significant time below or at 88% saturation of over 15 minutes for the study, indicating nocturnal hypoxemia.  There was a mild or borderline central apnea component as well.  I am going to order her a new CPAP machine, continue current settings at 13 cm water.  She may get a mask of choice if she would like to try something different.  Will need to follow-up within 30 to 90 days of new CPAP machine.   Orders Placed This Encounter  Procedures   For home use only DME continuous positive airway pressure (CPAP)

## 2023-11-09 NOTE — Progress Notes (Signed)
 See procedure note.

## 2023-11-09 NOTE — Procedures (Signed)
 [facility]  HOME SLEEP TEST (Watch PAT) REPORT  STUDY DATE: 11/08/2023  DOB: [birth year]  MRN: [id number]  ORDERING CLINICIAN: True Mar, MD, PhD   REFERRING CLINICIAN: Lauraine Born, NP  CLINICAL INFORMATION/HISTORY: 73 year old right-handed woman with an underlying medical history of OSA on PAP therapy, anxiety, depression, hypertension, hyperlipidemia, history of meningitis in 2015, anemia, gait disorder, memory loss, prior history of smoking, and morbid obesity, who presents for reevaluation of her obstructive sleep apnea.  She has been compliant with her PAP therapy with a set pressure of 13 cm with good tolerance and adequate apnea control.  She should be eligible for a new machine.  BMI: 42.6 kg/m  FINDINGS:   Sleep Summary:   Total Recording Time (hours, min): 8 hours, 31 min  Total Sleep Time (hours, min):  7 hours, 39 min  Percent REM (%):    17.2%   Respiratory Indices:   Calculated pAHI (per hour):  30.8/hour         REM pAHI:    42.2/hour       NREM pAHI: 28.5/hour  Central pAHI: 5.3/hour  Oxygen Saturation Statistics:    Oxygen Saturation (%) Mean: 93%   Minimum oxygen saturation (%):                 78%   O2 Saturation Range (%): 78- 99%    O2 Saturation (minutes) <=88%: 15.6 min  Pulse Rate Statistics:   Pulse Mean (bpm):    61/min    Pulse Range (52-87/min)   IMPRESSION:   OSA (obstructive sleep apnea), severe Central Sleep Apnea  Nocturnal Hypoxemia  RECOMMENDATION:  This home sleep test demonstrates severe obstructive sleep apnea with a total AHI of 30.8/hour and O2 nadir of 78% with significant time below or at 88% saturation of over 15 minutes for the study, indicating nocturnal hypoxemia.  There was a mild or borderline central apnea component as well.  Snoring was detected, in the moderate to loud range fairly consistently throughout the study.  Ongoing treatment with positive airway pressure is highly  recommended. The patient has been compliant with her CPAP of 13 cm with good tolerance and good apnea control.  I recommend prescribing a new CPAP machine and keeping the treatment settings the same, mask of choice, sized to fit. A laboratory attended titration study can be considered in the future for optimization of treatment settings and to improve tolerance and compliance, if needed, down the road. Alternative treatment options are limited secondary to the severity of the patient's sleep disordered breathing, but may include surgical treatment with an implantable hypoglossal nerve stimulator (in carefully selected candidates, meeting criteria).  Concomitant weight loss is recommended (where clinically appropriate). Please note, that untreated obstructive sleep apnea may carry additional perioperative morbidity. Patients with significant obstructive sleep apnea should receive perioperative PAP therapy and the surgeons and particularly the anesthesiologist should be informed of the diagnosis and the severity of the sleep disordered breathing. The patient should be cautioned not to drive, work at heights, or operate dangerous or heavy equipment when tired or sleepy. Review and reiteration of good sleep hygiene measures should be pursued with any patient. Other causes of the patient's symptoms, including circadian rhythm disturbances, an underlying mood disorder, medication effect and/or an underlying medical problem cannot be ruled out based on this test. Clinical correlation is recommended.  The patient and her referring provider will be notified of the test results. The patient will be seen in follow up in  sleep clinic at [facility].  I certify that I have reviewed the raw data recording prior to the issuance of this report in accordance with the standards of the American Academy of Sleep Medicine (AASM).    INTERPRETING PHYSICIAN:   True Mar, MD, PhD Medical Director, [facility]) Diplomat, ABPN (Neurology and Sleep)   [facility] [address] [place], [place] [address] [phone]

## 2023-11-10 NOTE — Telephone Encounter (Signed)
 Lvm 1st attempt by hf 11/10/23

## 2023-11-10 NOTE — Telephone Encounter (Signed)
 Called and gave results to pt:  HST 11/08/2023 showed severe OSA with total AHI 30.8/hour and O2 nadir of 78% with significant time below or at 88% saturation of over 15 minutes for the study, indicating nocturnal hypoxemia.  There was a mild or borderline central apnea component as well.   I am going to order her a new CPAP machine, continue current settings at 13 cm water.  She may get a mask of choice if she would like to try something different.   Will need to follow-up within 30 to 90 days of new CPAP machine.       Orders Placed This Encounter  Procedures   For home use only DME continuous positive airway pressure (CPAP)    Also, I scheduled appt for followup.   Message sent to dme DME [facility]  [phone]    Pt voiced gratitude and understanding of all discussed

## 2023-11-14 ENCOUNTER — Telehealth: Payer: Self-pay

## 2023-11-14 NOTE — Progress Notes (Signed)
   Telephone encounter was:  Successful.  Complex Care Management Note Care Guide Note  11/14/2023 Name: [name] [name] MRN: [id number] DOB: [birth year]  [name] [name] is a 73 y.o. year old female who is a primary care patient of Sagardia, Emil Schanz, MD . The community resource team was consulted for assistance with Life alert  SDOH screenings and interventions completed:  No        Care guide performed the following interventions: Patient provided with information about care guide support team and interviewed to confirm resource needs.Pt stated her insurance does not cover the life line or life alert. Pt requested I mail resources   Follow Up Plan:  No further follow up planned at this time. The patient has been provided with needed resources.  Encounter Outcome:  Patient Visit Completed    [name] [name] [facility]  [facility] Guide, Phone: [phone] Fax: [phone] Website: [email or web address]

## 2023-11-30 NOTE — Progress Notes (Signed)
 Remote pacemaker transmission.

## 2023-12-07 ENCOUNTER — Ambulatory Visit: Admitting: Gastroenterology

## 2023-12-07 ENCOUNTER — Encounter: Payer: Self-pay | Admitting: Gastroenterology

## 2023-12-07 VITALS — BP 118/68 | HR 65 | Ht 65.0 in | Wt 261.0 lb

## 2023-12-07 DIAGNOSIS — Z860100 Personal history of colon polyps, unspecified: Secondary | ICD-10-CM

## 2023-12-07 DIAGNOSIS — R194 Change in bowel habit: Secondary | ICD-10-CM

## 2023-12-07 DIAGNOSIS — K641 Second degree hemorrhoids: Secondary | ICD-10-CM | POA: Diagnosis not present

## 2023-12-07 NOTE — Patient Instructions (Signed)
 _______________________________________________________  If your blood pressure at your visit was 140/90 or greater, please contact your primary care physician to follow up on this.  _______________________________________________________  If you are age 73 or older, your body mass index should be between 23-30. Your Body mass index is 43.43 kg/m. If this is out of the aforementioned range listed, please consider follow up with your Primary Care Provider.  If you are age 14 or younger, your body mass index should be between 19-25. Your Body mass index is 43.43 kg/m. If this is out of the aformentioned range listed, please consider follow up with your Primary Care Provider.   ________________________________________________________  The [facility] providers would like to encourage you to use MYCHART to communicate with providers for non-urgent requests or questions.  Due to long hold times on the telephone, sending your provider a message by [facility] may be a faster and more efficient way to get a response.  Please allow 48 business hours for a response.  Please remember that this is for non-urgent requests.  _______________________________________________________  [facility] is using a team-based approach to care.  Your team is made up of your doctor and two to three APPS. Our APPS (Nurse Practitioners and Physician Assistants) work with your physician to ensure care continuity for you. They are fully qualified to address your health concerns and develop a treatment plan. They communicate directly with your gastroenterologist to care for you. Seeing the Advanced Practice Practitioners on your physician's team can help you by facilitating care more promptly, often allowing for earlier appointments, access to diagnostic testing, procedures, and other specialty referrals.   It was a pleasure to see you today!  Vito Cirigliano, D.O.

## 2023-12-07 NOTE — Progress Notes (Signed)
 Chief Complaint:    Symptomatic Internal Hemorrhoids; Hemorrhoid Band Ligation  GI History: 73 year old female with history of symptomatic bradycardia now s/p pacemaker placement in 09/2023, HTN, HLD, OSA, TIA, CKD 3, seizure disorder, diabetes, initially seen during hospital admission 09/2023 with hematochezia.  Admission Hgb 9.7 (baseline ~10-11).  CT angiography negative for active bleeding and otherwise normal-appearing GI tract.  Inpatient colonoscopy was deferred due to stable hemodynamics, stable hemoglobin, and significant bradycardia requiring pacemaker placement.  Endoscopic History: - 06/2015: Colonoscopy: Normal - 10/06/2023: Colonoscopy: 2 mm ascending colon adenoma, 4 mm sigmoid hyperplastic polyp, otherwise normal colon, normal TI.  Grade 2 medium size internal hemorrhoids.  Repeat in 7 years  HPI:     Patient is a 73 y.o. femalewith a history of symptomatic internal hemorrhoids presenting [facility] for follow-up and ongoing treatment. The patient presents with symptomatic grade 2  hemorrhoids, unresponsive to maximal medical therapy, requesting rubber band ligation of symptomatic hemorrhoidal disease.  Has had constipation for the last couple of days, but that is not typically an issue for her. Otherwise has normal, formed stools without straining.   No change in medical or surgical history, medications, allergies, social history since last appointment with me.   Review of systems:     No chest pain, no SOB, no fevers, no urinary sx   Past Medical History:  Diagnosis Date   Anxiety    Cataract    Chronic insomnia 10/13/2016   Depression    Gait disorder 03/14/2014   HTN (hypertension)    Hx of cardiovascular stress test    Lexiscan  Myoview (1/16):  No ischemia, EF 68%, breast atten; Low Risk   Hyperlipidemia    Phreesia 07/18/2020   Hypertension    Phreesia 07/18/2020   Memory difficulty 03/14/2014   Meningitis due to bacteria 01/18/2014   OSA  (obstructive sleep apnea) 11/30/2016   Seizures (HCC)    Sleep apnea    Phreesia 07/18/2020   TIA (transient ischemic attack)     Patient's surgical history, family medical history, social history, medications and allergies were all reviewed in Epic    Current Outpatient Medications  Medication Sig Dispense Refill   acetaminophen  (TYLENOL ) 325 MG tablet Take 650 mg by mouth at bedtime as needed for moderate pain (pain score 4-6).     aspirin  81 MG chewable tablet Chew 1 tablet (81 mg total) by mouth daily.     carbamazepine  (TEGRETOL ) 200 MG tablet TAKE 1 AND 1/2 TABLETS BY MOUTH TWO TIMES A DAY (Patient taking differently: No sig reported) 270 tablet 4   Cholecalciferol (VITAMIN D -3 PO) Take 1 capsule by mouth daily after supper.     escitalopram  (LEXAPRO ) 10 MG tablet TAKE 1 TABLET(10 MG) BY MOUTH AT BEDTIME (Patient taking differently: No sig reported) 90 tablet 1   ezetimibe  (ZETIA ) 10 MG tablet TAKE 1 TABLET(10 MG) BY MOUTH DAILY (Patient taking differently: Take 10 mg by mouth daily with supper.) 90 tablet 3   levETIRAcetam  (KEPPRA ) 750 MG tablet TAKE 2 TABLETS(1500 MG) BY MOUTH TWICE DAILY (Patient taking differently: Take 1,500 mg by mouth 2 (two) times daily.) 360 tablet 4   lisinopril -hydrochlorothiazide  (ZESTORETIC ) 20-25 MG tablet Take 1 tablet by mouth daily. 90 tablet 1   Multiple Vitamins-Minerals (MULTIVITAMIN WOMEN 50+) TABS Take 1 tablet by mouth daily after supper.     nitroGLYCERIN  (NITROSTAT ) 0.4 MG SL tablet Place 1 tablet (0.4 mg total) under the tongue every 5 (five) minutes as needed for chest  pain. 25 tablet 1   rosuvastatin  (CRESTOR ) 40 MG tablet TAKE 1 TABLET(40 MG) BY MOUTH DAILY (Patient taking differently: Take 40 mg by mouth daily with supper.) 90 tablet 3   No current facility-administered medications for this visit.    Physical Exam:     BP 118/68   Pulse 65   Ht 5' 5 (1.651 m)   Wt 261 lb (118.4 kg)   BMI 43.43 kg/m   GENERAL:  Pleasant female  in NAD PSYCH: : Cooperative, normal affect NEURO: Alert and oriented x 3, no focal neurologic deficits Rectal exam: Sensation intact and preserved anal wink.  Grade 2 hemorrhoids noted in all positions on anoscopy.  No external anal fissures noted. Normal sphincter tone. No palpable mass. No blood on the exam glove. (Chaperone: Loysie, CMA).   IMPRESSION and PLAN:    #1.  Symptomatic internal hemorrhoids: PROCEDURE NOTE: The patient presents with symptomatic grade 2 hemorrhoids, unresponsive to maximal medical therapy, requesting rubber band ligation of symptomatic hemorrhoidal disease.  All risks, benefits and alternative forms of therapy were described and informed consent was obtained.  In the Left Lateral Decubitus position, anoscopic examination revealed grade 2 hemorrhoids in the all position(s).  The anorectum was pre-medicated with RectiCare. The decision was made to band the LL internal hemorrhoid, and the [facility] System was used to perform band ligation without complication.  Digital anorectal examination was then performed to assure proper positioning of the band, and to adjust the banded tissue as required.  The patient was discharged home without pain or other issues.  Dietary and behavioral recommendations were given and along with follow-up instructions.    The patient will return in 4+ weeks for follow-up and possible additional banding as required. No complications were encountered and the patient tolerated the procedure well.      #2.  Change in bowel habits Recent onset constipation.  - Start fiber supplement - Increase water intake to a goal of 64 oz of water/day - If her symptoms persist, trial MiraLAX  #3. Hx of colon polyps - Repeat colonoscopy in 10/2030 for ongoing surveillance, pending overall health and patient wishes at that time (will be age 89).       Sandor GAILS Kaylianna Detert ,DO, FACG 12/07/2023, 11:25 AM

## 2023-12-15 ENCOUNTER — Ambulatory Visit: Attending: Physician Assistant | Admitting: Physician Assistant

## 2023-12-15 VITALS — BP 106/67 | HR 59 | Ht 65.0 in | Wt 254.0 lb

## 2023-12-15 DIAGNOSIS — Z950 Presence of cardiac pacemaker: Secondary | ICD-10-CM

## 2023-12-15 LAB — CUP PACEART INCLINIC DEVICE CHECK
Battery Remaining Longevity: 115 mo
Battery Voltage: 2.99 V
Brady Statistic RA Percent Paced: 92 %
Brady Statistic RV Percent Paced: 2.7 %
Date Time Interrogation Session: 20250814193702
Implantable Lead Connection Status: 753985
Implantable Lead Connection Status: 753985
Implantable Lead Implant Date: 20250513
Implantable Lead Implant Date: 20250513
Implantable Lead Location: 753859
Implantable Lead Location: 753860
Implantable Pulse Generator Implant Date: 20250513
Lead Channel Impedance Value: 437.5 Ohm
Lead Channel Impedance Value: 475 Ohm
Lead Channel Pacing Threshold Amplitude: 0.75 V
Lead Channel Pacing Threshold Amplitude: 0.75 V
Lead Channel Pacing Threshold Amplitude: 1 V
Lead Channel Pacing Threshold Amplitude: 1 V
Lead Channel Pacing Threshold Pulse Width: 0.5 ms
Lead Channel Pacing Threshold Pulse Width: 0.5 ms
Lead Channel Pacing Threshold Pulse Width: 0.5 ms
Lead Channel Pacing Threshold Pulse Width: 0.5 ms
Lead Channel Sensing Intrinsic Amplitude: 5 mV
Lead Channel Sensing Intrinsic Amplitude: 8.3 mV
Lead Channel Setting Pacing Amplitude: 2 V
Lead Channel Setting Pacing Amplitude: 2.5 V
Lead Channel Setting Pacing Pulse Width: 0.5 ms
Lead Channel Setting Sensing Sensitivity: 0.5 mV
Pulse Gen Model: 2272
Pulse Gen Serial Number: 8269852

## 2023-12-15 NOTE — Patient Instructions (Signed)
 Medication Instructions:   Your physician recommends that you continue on your current medications as directed. Please refer to the Current Medication list given to you today.   *If you need a refill on your cardiac medications before your next appointment, please call your pharmacy*    Lab Work: NONE ORDERED  TODAY    If you have labs (blood work) drawn today and your tests are completely normal, you will receive your results only by: MyChart Message (if you have MyChart) OR A paper copy in the mail If you have any lab test that is abnormal or we need to change your treatment, we will call you to review the results.   Testing/Procedures: NONE ORDERED  TODAY    Follow-Up: At [facility], you and your health needs are our priority.  As part of our continuing mission to provide you with exceptional heart care, our providers are all part of one team.  This team includes your primary Cardiologist (physician) and Advanced Practice Providers or APPs (Physician Assistants and Nurse Practitioners) who all work together to provide you with the care you need, when you need it.  Your next appointment:   1 year(s)  Provider:   Eulas Furbish, MD     We recommend signing up for the patient portal called MyChart.  Sign up information is provided on this After Visit Summary.  MyChart is used to connect with patients for Virtual Visits (Telemedicine).  Patients are able to view lab/test results, encounter notes, upcoming appointments, etc.  Non-urgent messages can be sent to your provider as well.   To learn more about what you can do with MyChart, go to [email or web address].   Other Instructions

## 2023-12-15 NOTE — Progress Notes (Signed)
  Cardiology Office Note:  .   Date:  12/15/2023  ID:  [name] [name], DOB 11/08/1950, MRN [id number] PCP: Purcell Emil Schanz, MD  [facility] Providers Cardiologist:  Lonni LITTIE Nanas, MD {  History of Present Illness: [name] [name]   [name] [name] is a 73 y.o. female w/PMHx of  HTN, HLD, OSA, TIA Bacterial meningitis, seizure d/o Sciatic pain is limiting Symptomatic bradycardia > PPM  She was referred to Dr. Nancey, saw him 07/29/23, for persistent episodes of symptomatic bradycardia off nodal blocking agents Planned for PPM  Today's visit is scheduled as her 90 day post implant visit ROS:   She is doing very well Some little pinches, pulls initially at her pocket, but no other concerns No CP, palpitations or cardiac awareness No SOB No near syncope or syncope   Device information SJM dual chamber PPM implanted 09/13/23   Studies Reviewed: [name] [name]    EKG not dine today  DEVICE interrogation done today and reviewed by myself Battery and lead measurements are good No arrhythmias Chronic outputs programmed   TTE March 2025 EF 65 to 70%.  Mildly reduced right ventricular systolic dysfunction.  Otherwise normal structure and function    7 day monitor October 2024 Sinus rhythm predominates.  Heart rate 37 to 108 bpm, average 54 bpm There were episodes of competing junctional rhythm with a rate of approximately 40 beats minute 1.7% atrial ectopy No symptoms were reported   Risk Assessment/Calculations:    Physical Exam:   VS:  There were no vitals taken for this visit.   Wt Readings from Last 3 Encounters:  12/07/23 261 lb (118.4 kg)  11/02/23 256 lb (116.1 kg)  10/20/23 255 lb (115.7 kg)    GEN: Well nourished, well developed in no acute distress NECK: No JVD; No carotid bruits CARDIAC: RRR, no murmurs, rubs, gallops RESPIRATORY:  CTA b/l without rales, wheezing or rhonchi  ABDOMEN: Soft, non-tender, non-distended EXTREMITIES: No edema; No  deformity   PPM site: well healed, site is stable, no thinning, fluctuation, tethering  ASSESSMENT AND PLAN: .    PPM intact function Chronic outputs programmed   Dispo: remotes as usual, in clinic again in a year, sooner if needed  Signed, Charlies Macario Arthur, PA-C

## 2023-12-18 ENCOUNTER — Ambulatory Visit: Payer: Self-pay | Admitting: Cardiovascular Disease

## 2023-12-22 ENCOUNTER — Other Ambulatory Visit: Payer: Self-pay | Admitting: Cardiology

## 2024-01-09 ENCOUNTER — Other Ambulatory Visit: Payer: Self-pay

## 2024-01-09 MED ORDER — LEVETIRACETAM 750 MG PO TABS: ORAL_TABLET | ORAL | 4 refills | Status: AC

## 2024-01-30 ENCOUNTER — Ambulatory Visit

## 2024-01-30 DIAGNOSIS — R002 Palpitations: Secondary | ICD-10-CM | POA: Diagnosis not present

## 2024-01-31 LAB — CUP PACEART REMOTE DEVICE CHECK
Battery Remaining Longevity: 109 mo
Battery Remaining Percentage: 95.5 %
Battery Voltage: 3.01 V
Brady Statistic AP VP Percent: 1.2 %
Brady Statistic AP VS Percent: 91 %
Brady Statistic AS VP Percent: 1 %
Brady Statistic AS VS Percent: 7.3 %
Brady Statistic RA Percent Paced: 91 %
Brady Statistic RV Percent Paced: 1.2 %
Date Time Interrogation Session: 20250929024218
Implantable Lead Connection Status: 753985
Implantable Lead Connection Status: 753985
Implantable Lead Implant Date: 20250513
Implantable Lead Implant Date: 20250513
Implantable Lead Location: 753859
Implantable Lead Location: 753860
Implantable Pulse Generator Implant Date: 20250513
Lead Channel Impedance Value: 440 Ohm
Lead Channel Impedance Value: 480 Ohm
Lead Channel Pacing Threshold Amplitude: 0.75 V
Lead Channel Pacing Threshold Amplitude: 1 V
Lead Channel Pacing Threshold Pulse Width: 0.5 ms
Lead Channel Pacing Threshold Pulse Width: 0.5 ms
Lead Channel Sensing Intrinsic Amplitude: 4.9 mV
Lead Channel Sensing Intrinsic Amplitude: 6.4 mV
Lead Channel Setting Pacing Amplitude: 2 V
Lead Channel Setting Pacing Amplitude: 2.5 V
Lead Channel Setting Pacing Pulse Width: 0.5 ms
Lead Channel Setting Sensing Sensitivity: 0.5 mV
Pulse Gen Model: 2272
Pulse Gen Serial Number: 8269852

## 2024-02-01 NOTE — Progress Notes (Signed)
 Remote PPM Transmission

## 2024-02-07 ENCOUNTER — Ambulatory Visit: Payer: Self-pay | Admitting: Cardiovascular Disease

## 2024-02-08 ENCOUNTER — Ambulatory Visit: Admitting: Emergency Medicine

## 2024-02-08 ENCOUNTER — Encounter: Payer: Self-pay | Admitting: Emergency Medicine

## 2024-02-08 ENCOUNTER — Ambulatory Visit: Payer: Self-pay | Admitting: Emergency Medicine

## 2024-02-08 VITALS — BP 110/86 | HR 61 | Temp 98.2°F | Ht 65.0 in | Wt 260.0 lb

## 2024-02-08 DIAGNOSIS — Z95811 Presence of heart assist device: Secondary | ICD-10-CM

## 2024-02-08 DIAGNOSIS — G40909 Epilepsy, unspecified, not intractable, without status epilepticus: Secondary | ICD-10-CM

## 2024-02-08 DIAGNOSIS — N1831 Chronic kidney disease, stage 3a: Secondary | ICD-10-CM | POA: Diagnosis not present

## 2024-02-08 DIAGNOSIS — G4733 Obstructive sleep apnea (adult) (pediatric): Secondary | ICD-10-CM

## 2024-02-08 DIAGNOSIS — E119 Type 2 diabetes mellitus without complications: Secondary | ICD-10-CM

## 2024-02-08 DIAGNOSIS — I10 Essential (primary) hypertension: Secondary | ICD-10-CM | POA: Diagnosis not present

## 2024-02-08 DIAGNOSIS — E785 Hyperlipidemia, unspecified: Secondary | ICD-10-CM | POA: Diagnosis not present

## 2024-02-08 DIAGNOSIS — F411 Generalized anxiety disorder: Secondary | ICD-10-CM

## 2024-02-08 LAB — COMPREHENSIVE METABOLIC PANEL WITH GFR
ALT: 19 U/L (ref 0–35)
AST: 23 U/L (ref 0–37)
Albumin: 4.1 g/dL (ref 3.5–5.2)
Alkaline Phosphatase: 88 U/L (ref 39–117)
BUN: 31 mg/dL — ABNORMAL HIGH (ref 6–23)
CO2: 30 meq/L (ref 19–32)
Calcium: 9 mg/dL (ref 8.4–10.5)
Chloride: 106 meq/L (ref 96–112)
Creatinine, Ser: 0.88 mg/dL (ref 0.40–1.20)
GFR: 65.44 mL/min (ref >60.00)
Glucose, Bld: 97 mg/dL (ref 70–99)
Potassium: 4.2 meq/L (ref 3.5–5.1)
Sodium: 143 meq/L (ref 135–145)
Total Bilirubin: 0.2 mg/dL (ref 0.2–1.2)
Total Protein: 6.4 g/dL (ref 6.0–8.3)

## 2024-02-08 LAB — CBC WITH DIFFERENTIAL/PLATELET
Basophils Absolute: 0 K/uL (ref 0.0–0.1)
Basophils Relative: 0.6 % (ref 0.0–3.0)
Eosinophils Absolute: 0.1 K/uL (ref 0.0–0.7)
Eosinophils Relative: 3.1 % (ref 0.0–5.0)
HCT: 31.6 % — ABNORMAL LOW (ref 36.0–46.0)
Hemoglobin: 10.1 g/dL — ABNORMAL LOW (ref 12.0–15.0)
Lymphocytes Relative: 21.6 % (ref 12.0–46.0)
Lymphs Abs: 0.7 K/uL (ref 0.7–4.0)
MCHC: 31.8 g/dL (ref 30.0–36.0)
MCV: 81 fl (ref 78.0–100.0)
Monocytes Absolute: 0.3 K/uL (ref 0.1–1.0)
Monocytes Relative: 10.2 % (ref 3.0–12.0)
Neutro Abs: 2.1 K/uL (ref 1.4–7.7)
Neutrophils Relative %: 64.5 % (ref 43.0–77.0)
Platelets: 184 K/uL (ref 150.0–400.0)
RBC: 3.9 Mil/uL (ref 3.87–5.11)
RDW: 13.8 % (ref 11.5–15.5)
WBC: 3.3 K/uL — ABNORMAL LOW (ref 4.0–10.5)

## 2024-02-08 LAB — HEMOGLOBIN A1C: Hgb A1c MFr Bld: 6.6 % — ABNORMAL HIGH (ref 4.6–6.5)

## 2024-02-08 LAB — MICROALBUMIN / CREATININE URINE RATIO
Creatinine,U: 146 mg/dL
Microalb Creat Ratio: 8.5 mg/g (ref 0.0–30.0)
Microalb, Ur: 1.2 mg/dL (ref 0.0–1.9)

## 2024-02-08 LAB — LIPID PANEL
Cholesterol: 169 mg/dL (ref 0–200)
HDL: 69.4 mg/dL (ref >39.00)
LDL Cholesterol: 82 mg/dL (ref 0–99)
NonHDL: 99.63
Total CHOL/HDL Ratio: 2
Triglycerides: 88 mg/dL (ref 0.0–149.0)
VLDL: 17.6 mg/dL (ref 0.0–40.0)

## 2024-02-08 LAB — TSH: TSH: 1.77 u[IU]/mL (ref 0.35–5.50)

## 2024-02-08 NOTE — Assessment & Plan Note (Signed)
Stable.  On CPAP treatment. 

## 2024-02-08 NOTE — Assessment & Plan Note (Signed)
 Continue Lexapro 10 mg daily

## 2024-02-08 NOTE — Assessment & Plan Note (Signed)
 Chronic stable condition. Advised to stay well-hydrated and avoid NSAIDs

## 2024-02-08 NOTE — Assessment & Plan Note (Signed)
Diet and nutrition discussed.  Lipid profile done today. Continue rosuvastatin 40 mg daily.

## 2024-02-08 NOTE — Assessment & Plan Note (Signed)
Stable and well-controlled Continues carbamazepine and Keppra.  Medications managed by neurologist

## 2024-02-08 NOTE — Patient Instructions (Signed)
 Health Maintenance After Age 73 After age 27, you are at a higher risk for certain long-term diseases and infections as well as injuries from falls. Falls are a major cause of broken bones and head injuries in people who are older than age 73. Getting regular preventive care can help to keep you healthy and well. Preventive care includes getting regular testing and making lifestyle changes as recommended by your health care provider. Talk with your health care provider about: Which screenings and tests you should have. A screening is a test that checks for a disease when you have no symptoms. A diet and exercise plan that is right for you. What should I know about screenings and tests to prevent falls? Screening and testing are the best ways to find a health problem early. Early diagnosis and treatment give you the best chance of managing medical conditions that are common after age 90. Certain conditions and lifestyle choices may make you more likely to have a fall. Your health care provider may recommend: Regular vision checks. Poor vision and conditions such as cataracts can make you more likely to have a fall. If you wear glasses, make sure to get your prescription updated if your vision changes. Medicine review. Work with your health care provider to regularly review all of the medicines you are taking, including over-the-counter medicines. Ask your health care provider about any side effects that may make you more likely to have a fall. Tell your health care provider if any medicines that you take make you feel dizzy or sleepy. Strength and balance checks. Your health care provider may recommend certain tests to check your strength and balance while standing, walking, or changing positions. Foot health exam. Foot pain and numbness, as well as not wearing proper footwear, can make you more likely to have a fall. Screenings, including: Osteoporosis screening. Osteoporosis is a condition that causes  the bones to get weaker and break more easily. Blood pressure screening. Blood pressure changes and medicines to control blood pressure can make you feel dizzy. Depression screening. You may be more likely to have a fall if you have a fear of falling, feel depressed, or feel unable to do activities that you used to do. Alcohol  use screening. Using too much alcohol  can affect your balance and may make you more likely to have a fall. Follow these instructions at home: Lifestyle Do not drink alcohol  if: Your health care provider tells you not to drink. If you drink alcohol : Limit how much you have to: 0-1 drink a day for women. 0-2 drinks a day for men. Know how much alcohol  is in your drink. In the [place] one drink equals one 12 oz bottle of beer (355 mL), one 5 oz glass of wine (148 mL), or one 1 oz glass of hard liquor (44 mL). Do not use any products that contain nicotine or tobacco. These products include cigarettes, chewing tobacco, and vaping devices, such as e-cigarettes. If you need help quitting, ask your health care provider. Activity  Follow a regular exercise program to stay fit. This will help you maintain your balance. Ask your health care provider what types of exercise are appropriate for you. If you need a cane or walker, use it as recommended by your health care provider. Wear supportive shoes that have nonskid soles. Safety  Remove any tripping hazards, such as rugs, cords, and clutter. Install safety equipment such as grab bars in bathrooms and safety rails on stairs. Keep rooms and walkways  well-lit. General instructions Talk with your health care provider about your risks for falling. Tell your health care provider if: You fall. Be sure to tell your health care provider about all falls, even ones that seem minor. You feel dizzy, tiredness (fatigue), or off-balance. Take over-the-counter and prescription medicines only as told by your health care provider. These include  supplements. Eat a healthy diet and maintain a healthy weight. A healthy diet includes low-fat dairy products, low-fat (lean) meats, and fiber from whole grains, beans, and lots of fruits and vegetables. Stay current with your vaccines. Schedule regular health, dental, and eye exams. Summary Having a healthy lifestyle and getting preventive care can help to protect your health and wellness after age 15. Screening and testing are the best way to find a health problem early and help you avoid having a fall. Early diagnosis and treatment give you the best chance for managing medical conditions that are more common for people who are older than age 42. Falls are a major cause of broken bones and head injuries in people who are older than age 64. Take precautions to prevent a fall at home. Work with your health care provider to learn what changes you can make to improve your health and wellness and to prevent falls. This information is not intended to replace advice given to you by your health care provider. Make sure you discuss any questions you have with your health care provider. Document Revised: 09/08/2020 Document Reviewed: 09/08/2020 Elsevier Patient Education  2024 [employer].

## 2024-02-08 NOTE — Assessment & Plan Note (Signed)
Pacemaker working well.  No concerns.

## 2024-02-08 NOTE — Progress Notes (Signed)
 [name] [name] 73 y.o.   Chief Complaint  Patient presents with   Follow-up    Patient here for 6 month f/u for HTN, patient mentions supposed to be getting labs/ urine. Patient mentions having a tooth ache and did go to the dentist to have it looked at and was given amoxicillin      HISTORY OF PRESENT ILLNESS: This is a 73 y.o. female here for 1-month follow-up of chronic medical conditions including hypertension Overall doing well. Recently started on amoxicillin  by her dentist due to toothache.  Started last night. No other complaints or medical concerns today.  HPI   Prior to Admission medications   Medication Sig Start Date End Date Taking? Authorizing Provider  acetaminophen  (TYLENOL ) 325 MG tablet Take 650 mg by mouth at bedtime as needed for moderate pain (pain score 4-6).   Yes [provider]  amoxicillin  (AMOXIL ) 500 MG tablet Take 500 mg by mouth 2 (two) times daily. 02/07/24  Yes [provider]  aspirin  81 MG chewable tablet Chew 1 tablet (81 mg total) by mouth daily. 09/16/23  Yes Mealor, Augustus E, MD  carbamazepine  (TEGRETOL ) 200 MG tablet TAKE 1 AND 1/2 TABLETS BY MOUTH TWO TIMES A DAY Patient taking differently: No sig reported 01/24/23  Yes Gayland Lauraine PARAS, NP  Cholecalciferol (VITAMIN D -3 PO) Take 1 capsule by mouth daily after supper.   Yes [provider]  escitalopram  (LEXAPRO ) 10 MG tablet TAKE 1 TABLET(10 MG) BY MOUTH AT BEDTIME Patient taking differently: No sig reported 08/20/23  Yes Freddye Cardamone, Emil Schanz, MD  ezetimibe  (ZETIA ) 10 MG tablet TAKE 1 TABLET(10 MG) BY MOUTH DAILY Patient taking differently: Take 10 mg by mouth daily with supper. 04/05/23  Yes Kate Lonni CROME, MD  levETIRAcetam  (KEPPRA ) 750 MG tablet TAKE 2 TABLETS(1500 MG) BY MOUTH TWICE DAILY 01/09/24  Yes Gayland Lauraine PARAS, NP  lisinopril -hydrochlorothiazide  (ZESTORETIC ) 20-25 MG tablet Take 1 tablet by mouth daily. 09/11/23  Yes Odell Celinda Balo, MD  Multiple  Vitamins-Minerals (MULTIVITAMIN WOMEN 50+) TABS Take 1 tablet by mouth daily after supper.   Yes [provider]  nitroGLYCERIN  (NITROSTAT ) 0.4 MG SL tablet Place 1 tablet (0.4 mg total) under the tongue every 5 (five) minutes as needed for chest pain. 12/21/21  Yes Kate Lonni CROME, MD  rosuvastatin  (CRESTOR ) 40 MG tablet TAKE 1 TABLET(40 MG) BY MOUTH DAILY 12/23/23  Yes Kate Lonni CROME, MD    Allergies  Allergen Reactions   Nsaids Other (See Comments)    CKD stage 3     Patient Active Problem List   Diagnosis Date Noted   Presence of heart assist device (HCC) 02/08/2024   CKD (chronic kidney disease) stage 3, GFR 30-59 ml/min (HCC) 09/08/2023   Controlled type 2 diabetes mellitus without complication, without long-term current use of insulin (HCC) 09/08/2023   Dyspnea on exertion 12/17/2021   Seizure disorder (HCC) 05/28/2019   OSA (obstructive sleep apnea) 11/30/2016   Chronic insomnia 10/13/2016   Transient memory loss 08/26/2016   Morbid obesity (HCC) 05/31/2016   Glucose intolerance (impaired glucose tolerance) 05/18/2016   Pronation deformity of ankle, acquired 04/19/2014   Generalized anxiety disorder 03/15/2014   Memory difficulty 03/14/2014   Demand myocardial ischemic related infarction 01/23/2014    Class: Diagnosis of   Depression, reactive 10/12/2012   Chronic neutropenia 04/14/2012   Dyslipidemia 10/20/2011   HTN (hypertension) 10/20/2011    Past Medical History:  Diagnosis Date   Anxiety    Cataract  Chronic insomnia 10/13/2016   Depression    Gait disorder 03/14/2014   HTN (hypertension)    Hx of cardiovascular stress test    Lexiscan  Myoview (1/16):  No ischemia, EF 68%, breast atten; Low Risk   Hyperlipidemia    Phreesia 07/18/2020   Hypertension    Phreesia 07/18/2020   Memory difficulty 03/14/2014   Meningitis due to bacteria 01/18/2014   OSA (obstructive sleep apnea) 11/30/2016   Seizures (HCC)    Sleep apnea     Phreesia 07/18/2020   TIA (transient ischemic attack)     Past Surgical History:  Procedure Laterality Date   NO PAST SURGERIES     PACEMAKER IMPLANT N/A 09/13/2023   Procedure: PACEMAKER IMPLANT;  Surgeon: Nancey Eulas BRAVO, MD;  Location: [facility];  Service: Cardiovascular;  Laterality: N/A;    Social History   Socioeconomic History   Marital status: Single    Spouse name: Not on file   Number of children: 1   Years of education: college   Highest education level: Some college, no degree  Occupational History   Occupation: Retired Financial planner: [employer]  Tobacco Use   Smoking status: Former    Current packs/day: 0.00    Average packs/day: 0.5 packs/day for 40.0 years (20.0 ttl pk-yrs)    Types: Cigarettes    Start date: 07/09/1971    Quit date: 07/09/2011    Years since quitting: 12.5   Smokeless tobacco: Never  Vaping Use   Vaping status: Never Used  Substance and Sexual Activity   Alcohol use: Never   Drug use: No   Sexual activity: Never    Birth control/protection: Abstinence  Other Topics Concern   Not on file  Social History Narrative   Marital status: divorced x 35 years; not dating; not interested in 2019.      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      Tobacco: quit smoking in 2013.  Smoked x many years       Alcohol: none; holidays only      Exercise: none; quit exercising two years ago in 2015.        Seatbelt: 100%; no texting. Does not drive in [address] due to seizure activity.       ADLs: no driving in [place] due to seizure activity; independent with ADLs.  Has cane for long distances      Advanced Directives: none; [name]/sister.  FULL CODE no prolonged measures.         Patient is single lives at home alone, has 1 child   Patient is right handed   Education level is 1 year of college   Caffeine  consumption is 2 cups daily   Social Drivers of Health   Financial Resource Strain: Low Risk  (02/07/2024)   Overall Financial Resource Strain (CARDIA)    Difficulty of Paying Living Expenses: Not hard at all  Food Insecurity: No Food Insecurity (02/07/2024)   Hunger Vital Sign    Worried About Running Out of Food in the Last Year: Never true    Ran Out of Food in the Last Year: Never true  Transportation Needs: No Transportation Needs (02/07/2024)   PRAPARE - Administrator, Civil Service (Medical): No    Lack of Transportation (Non-Medical): No  Physical Activity: Inactive (02/07/2024)  Exercise Vital Sign    Days of Exercise per Week: 0 days    Minutes of Exercise per Session: Not on file  Stress: No Stress Concern Present (02/07/2024)   [employer] of Occupational Health - Occupational Stress Questionnaire    Feeling of Stress: Only a little  Social Connections: Moderately Integrated (02/07/2024)   Social Connection and Isolation Panel    Frequency of Communication with Friends and Family: More than three times a week    Frequency of Social Gatherings with Friends and Family: Once a week    Attends Religious Services: More than 4 times per year    Active Member of [employer] or Organizations: Yes    Attends Engineer, structural: More than 4 times per year    Marital Status: Divorced  Intimate Partner Violence: Patient Unable To Answer (10/20/2023)   Humiliation, Afraid, Rape, and Kick questionnaire    Fear of Current or Ex-Partner: Patient unable to answer    Emotionally Abused: Patient unable to answer    Physically Abused: Patient unable to answer    Sexually Abused: Patient unable to answer    Family History  Problem Relation Age of Onset   Heart attack Mother 85   Stroke Mother    Hypertension Mother    Heart attack Father 58   Cerebral aneurysm Sister    Hypertension Sister    Kidney disease Sister        ESRD; HD in last year   Hypertension  Brother    Hyperlipidemia Brother    Rheum arthritis Sister    Arthritis Sister    Hypertension Brother    Hyperlipidemia Brother    Hyperlipidemia Brother    Hypertension Brother      Review of Systems  Constitutional: Negative.  Negative for chills and fever.  HENT: Negative.  Negative for congestion and sore throat.   Respiratory: Negative.  Negative for cough and shortness of breath.   Cardiovascular: Negative.  Negative for chest pain and palpitations.  Gastrointestinal:  Negative for abdominal pain, diarrhea, nausea and vomiting.  Genitourinary: Negative.  Negative for dysuria and hematuria.  Skin: Negative.  Negative for rash.  Neurological: Negative.  Negative for dizziness and headaches.  All other systems reviewed and are negative.   Vitals:   02/08/24 1019  BP: 110/86  Pulse: 61  Temp: 98.2 F (36.8 C)  SpO2: 95%    Physical Exam Vitals reviewed.  Constitutional:      Appearance: Normal appearance.  HENT:     Head: Normocephalic.     Mouth/Throat:     Mouth: Mucous membranes are moist.     Pharynx: Oropharynx is clear.  Eyes:     Extraocular Movements: Extraocular movements intact.     Pupils: Pupils are equal, round, and reactive to light.  Cardiovascular:     Rate and Rhythm: Normal rate and regular rhythm.     Pulses: Normal pulses.     Heart sounds: Normal heart sounds.     Comments: Permanent pacemaker left upper chest Pulmonary:     Effort: Pulmonary effort is normal.     Breath sounds: Normal breath sounds.  Abdominal:     Palpations: Abdomen is soft.     Tenderness: There is no abdominal tenderness.  Musculoskeletal:     Cervical back: No tenderness.  Lymphadenopathy:     Cervical: No cervical adenopathy.  Skin:    General: Skin is warm and dry.     Capillary Refill: Capillary refill takes  less than 2 seconds.  Neurological:     General: No focal deficit present.     Mental Status: She is alert and oriented to person, place, and time.   Psychiatric:        Mood and Affect: Mood normal.        Behavior: Behavior normal.      ASSESSMENT & PLAN: A total of 40 minutes was spent with the patient and counseling/coordination of care regarding preparing for this visit, review of most recent office visit notes, review of multiple chronic medical conditions and their management, review of all medications, review of most recent bloodwork results, review of health maintenance items, education on nutrition, prognosis, documentation, and need for follow up.   Problem List Items Addressed This Visit       Cardiovascular and Mediastinum   HTN (hypertension) - Primary (Chronic)   BP Readings from Last 3 Encounters:  02/08/24 110/86  12/15/23 106/67  12/07/23 118/68  Well-controlled hypertension. Continue Zestoretic  20-25 mg daily       Relevant Orders   CBC with Differential/Platelet   Comprehensive metabolic panel with GFR   Lipid panel     Respiratory   OSA (obstructive sleep apnea)   Stable. On CPAP treatment       Relevant Orders   CBC with Differential/Platelet     Endocrine   Controlled type 2 diabetes mellitus without complication, without long-term current use of insulin (HCC)   Lab Results  Component Value Date   HGBA1C 6.5 08/04/2023  Well-controlled diabetes off medications       Relevant Orders   Microalbumin / creatinine urine ratio   Comprehensive metabolic panel with GFR   Hemoglobin A1c     Nervous and Auditory   Seizure disorder (HCC)   Stable and well-controlled Continues carbamazepine  and Keppra .  Medications managed by neurologist        Genitourinary   CKD (chronic kidney disease) stage 3, GFR 30-59 ml/min (HCC)   Chronic stable condition Advised to stay well-hydrated and avoid NSAIDs.      Relevant Orders   Microalbumin / creatinine urine ratio   Comprehensive metabolic panel with GFR     Other   Dyslipidemia   Diet and nutrition discussed Lipid profile done  today Continue rosuvastatin  40 mg daily        Relevant Orders   Comprehensive metabolic panel with GFR   Lipid panel   Generalized anxiety disorder   Continue Lexapro  10 mg daily.      Morbid obesity (HCC)   Diet and nutrition discussed Advised to decrease amount of daily carbohydrate intake and daily calories and increase amount of plant-based protein in her diet      Relevant Orders   CBC with Differential/Platelet   Comprehensive metabolic panel with GFR   Hemoglobin A1c   Lipid panel   TSH   Presence of heart assist device [facility])   Pacemaker working well.  No concerns.      Patient Instructions  Health Maintenance After Age 34 After age 36, you are at a higher risk for certain long-term diseases and infections as well as injuries from falls. Falls are a major cause of broken bones and head injuries in people who are older than age 79. Getting regular preventive care can help to keep you healthy and well. Preventive care includes getting regular testing and making lifestyle changes as recommended by your health care provider. Talk with your health care provider about: Which screenings and tests  you should have. A screening is a test that checks for a disease when you have no symptoms. A diet and exercise plan that is right for you. What should I know about screenings and tests to prevent falls? Screening and testing are the best ways to find a health problem early. Early diagnosis and treatment give you the best chance of managing medical conditions that are common after age 17. Certain conditions and lifestyle choices may make you more likely to have a fall. Your health care provider may recommend: Regular vision checks. Poor vision and conditions such as cataracts can make you more likely to have a fall. If you wear glasses, make sure to get your prescription updated if your vision changes. Medicine review. Work with your health care provider to regularly review all of the  medicines you are taking, including over-the-counter medicines. Ask your health care provider about any side effects that may make you more likely to have a fall. Tell your health care provider if any medicines that you take make you feel dizzy or sleepy. Strength and balance checks. Your health care provider may recommend certain tests to check your strength and balance while standing, walking, or changing positions. Foot health exam. Foot pain and numbness, as well as not wearing proper footwear, can make you more likely to have a fall. Screenings, including: Osteoporosis screening. Osteoporosis is a condition that causes the bones to get weaker and break more easily. Blood pressure screening. Blood pressure changes and medicines to control blood pressure can make you feel dizzy. Depression screening. You may be more likely to have a fall if you have a fear of falling, feel depressed, or feel unable to do activities that you used to do. Alcohol use screening. Using too much alcohol can affect your balance and may make you more likely to have a fall. Follow these instructions at home: Lifestyle Do not drink alcohol if: Your health care provider tells you not to drink. If you drink alcohol: Limit how much you have to: 0-1 drink a day for women. 0-2 drinks a day for men. Know how much alcohol is in your drink. In the [place] one drink equals one 12 oz bottle of beer (355 mL), one 5 oz glass of wine (148 mL), or one 1 oz glass of hard liquor (44 mL). Do not use any products that contain nicotine or tobacco. These products include cigarettes, chewing tobacco, and vaping devices, such as e-cigarettes. If you need help quitting, ask your health care provider. Activity  Follow a regular exercise program to stay fit. This will help you maintain your balance. Ask your health care provider what types of exercise are appropriate for you. If you need a cane or walker, use it as recommended by your health  care provider. Wear supportive shoes that have nonskid soles. Safety  Remove any tripping hazards, such as rugs, cords, and clutter. Install safety equipment such as grab bars in bathrooms and safety rails on stairs. Keep rooms and walkways well-lit. General instructions Talk with your health care provider about your risks for falling. Tell your health care provider if: You fall. Be sure to tell your health care provider about all falls, even ones that seem minor. You feel dizzy, tiredness (fatigue), or off-balance. Take over-the-counter and prescription medicines only as told by your health care provider. These include supplements. Eat a healthy diet and maintain a healthy weight. A healthy diet includes low-fat dairy products, low-fat (lean) meats, and fiber from whole  grains, beans, and lots of fruits and vegetables. Stay current with your vaccines. Schedule regular health, dental, and eye exams. Summary Having a healthy lifestyle and getting preventive care can help to protect your health and wellness after age 45. Screening and testing are the best way to find a health problem early and help you avoid having a fall. Early diagnosis and treatment give you the best chance for managing medical conditions that are more common for people who are older than age 68. Falls are a major cause of broken bones and head injuries in people who are older than age 25. Take precautions to prevent a fall at home. Work with your health care provider to learn what changes you can make to improve your health and wellness and to prevent falls. This information is not intended to replace advice given to you by your health care provider. Make sure you discuss any questions you have with your health care provider. Document Revised: 09/08/2020 Document Reviewed: 09/08/2020 Elsevier Patient Education  2024 Elsevier Inc.    Emil Schaumann, MD [facility]

## 2024-02-08 NOTE — Assessment & Plan Note (Signed)
 Lab Results  Component Value Date   HGBA1C 6.5 08/04/2023  Well-controlled diabetes off medications

## 2024-02-08 NOTE — Assessment & Plan Note (Addendum)
 BP Readings from Last 3 Encounters:  02/08/24 110/86  12/15/23 106/67  12/07/23 118/68  Well-controlled hypertension. Continue Zestoretic  20-25 mg daily

## 2024-02-08 NOTE — Assessment & Plan Note (Signed)
 Diet and nutrition discussed.  Advised to decrease amount of daily carbohydrate intake and daily calories and increase amount of plant-based protein in her diet.

## 2024-02-14 ENCOUNTER — Ambulatory Visit: Admitting: Neurology

## 2024-02-14 ENCOUNTER — Encounter: Payer: Self-pay | Admitting: Neurology

## 2024-02-14 ENCOUNTER — Encounter: Admitting: Gastroenterology

## 2024-02-14 VITALS — BP 118/58 | HR 71 | Resp 17 | Ht 65.0 in | Wt 261.0 lb

## 2024-02-14 DIAGNOSIS — G40909 Epilepsy, unspecified, not intractable, without status epilepticus: Secondary | ICD-10-CM

## 2024-02-14 DIAGNOSIS — G4733 Obstructive sleep apnea (adult) (pediatric): Secondary | ICD-10-CM

## 2024-02-14 NOTE — Progress Notes (Signed)
 PATIENT: [name] [name] DOB: 07-Jan-1951  REASON FOR VISIT: follow up seizures, CPAP HISTORY FROM: patient PRIMARY NEUROLOGIST: Dr. Buck  HISTORY OF PRESENT ILLNESS: Today 02/14/24 02/14/24 SS: HST 11/08/2023 showed severe OSA with total AHI 30.8/hour and O2 nadir of 78% with significant time below or at 88% saturation of over 15 minutes for the study, indicating nocturnal hypoxemia.  There was a mild or borderline central apnea component as well.  CPAP download shows 100% compliance, set pressure of 13 cmH2O, leak 10.7, AHI 1.1. CPAP setup 11/29/2023. Her new machine is quiet, thinks the straps are too short, making mask too tight. Using FFM. Dealing with tooth infection on amoxicillin .   11/02/23 SS: Last visit, I ordered HST/PSG for new machine. Her current CPAP was issued in July 2018. Her CPAP irritates her face and head, feels like the straps don't fit right. Uses FFM.  Has chronic insomnia, often times lays there with CPAP on. Download shows 83% usage, 13 cm water, AHI 1.3, leak 21.5. had pacemaker placed May 2025. Is afraid to go to sleep without CPAP, so when she sleeps without it, she just lays there. No seizures. Remains on carbamazepine  and Keppra .   10/28/22 SS: No seizures reported. Remains on carbamazepine  200 mg 1.5 tablets twice daily, Keppra  750 mg 2 tablets twice daily. Now seeing cardiology. Was having fatigue, palpations, heart racing. Placed on Zetia , feeling better. Uses CPAP diligently, but it is uncomfortable. Has caused rashes various places. Often times she takes it off early morning when she wakes up. We think her machine is more than 73 years old. We think since 2018. Using full face mask. Really isn't sure how much help CPAP is doing but she is so used to it, hard to compare, she doesn't miss nights. She is scared to sleep without it. She would be thrilled if she didn't have sleep apnea. In 2018, spilt night sleep study 1. This study demonstrates severe obstructive sleep  apnea, with a total AHI of 48.3/hour, REM AHI of 110.9/hour, supine AHI of and O2 nadir of 58%. ESS 15. Many nights she tosses and turns, has had insomnia her entire adult life. Cut out caffeine. Is on oral vitamin D .  Official CPAP report is attached, overall nightly use greater than 4 hours 97%.  Average usage 6 hours 39 minutes.  Set pressure 13 cm water.  Leak 13.9.  AHI 1.5.  10/29/21 SS: [name] [name] is here today. No seizures. Remains on Keppra  and carbamazepine . DM doing well. Last A1C was 6.5. Using CPAP. Having hard time getting mask, straps adjusted. Right now not an issue.  Is faithful to use CPAP nightly review of download of the last 30 days shows 100% compliance, greater than 4 hours 29 days.  Average usage 7 hours 8 minutes.  Set pressure 13.  Leak in the 95th percentile 14.2, AHI 1.6.  Uses full facemask.  Update 10/29/20 SS: [name] [name] is a 73 year old female with history of seizures (staring episodes).  On carbamazepine  and Keppra .  Last seizure event was around November 2021.  Carbamazepine  was increased to 300 mg twice a day.  EEG was normal in January 2022. Diagnosed with Type 2 DM, A1C 6.6, started metformin , has lost 20 lbs.   CPAP download is listed below, overall shows excellent compliance, AHI is well treated at 1.0, leak in the 95th percentile is 15.1. Small issue with humidification tub having leaks, sent replacement parts 3 times, all with cracks, wants to go without using  humidification at all. Denies any dry mouth. Uses full face mask. Missed 1 night when she was out of town. 2 nights she couldn't wear it well because of 2 molars, keeps having to adjust the mask from this healing. Sometimes seems like a leak.   REVIEW OF SYSTEMS: Out of a complete 14 system review of symptoms, the patient complains only of the following symptoms, and all other reviewed systems are negative.  See HPI  ALLERGIES: Allergies  Allergen Reactions   Nsaids Other (See Comments)    CKD  stage 3     HOME MEDICATIONS: Outpatient Medications Prior to Visit  Medication Sig Dispense Refill   acetaminophen  (TYLENOL ) 325 MG tablet Take 650 mg by mouth at bedtime as needed for moderate pain (pain score 4-6).     amoxicillin  (AMOXIL ) 500 MG tablet Take 500 mg by mouth 2 (two) times daily.     aspirin  81 MG chewable tablet Chew 1 tablet (81 mg total) by mouth daily.     carbamazepine  (TEGRETOL ) 200 MG tablet TAKE 1 AND 1/2 TABLETS BY MOUTH TWO TIMES A DAY (Patient taking differently: No sig reported) 270 tablet 4   Cholecalciferol (VITAMIN D -3 PO) Take 1 capsule by mouth daily after supper.     escitalopram  (LEXAPRO ) 10 MG tablet TAKE 1 TABLET(10 MG) BY MOUTH AT BEDTIME (Patient taking differently: No sig reported) 90 tablet 1   ezetimibe  (ZETIA ) 10 MG tablet TAKE 1 TABLET(10 MG) BY MOUTH DAILY (Patient taking differently: Take 10 mg by mouth daily with supper.) 90 tablet 3   levETIRAcetam  (KEPPRA ) 750 MG tablet TAKE 2 TABLETS(1500 MG) BY MOUTH TWICE DAILY 360 tablet 4   lisinopril -hydrochlorothiazide  (ZESTORETIC ) 20-25 MG tablet Take 1 tablet by mouth daily. 90 tablet 1   Multiple Vitamins-Minerals (MULTIVITAMIN WOMEN 50+) TABS Take 1 tablet by mouth daily after supper.     nitroGLYCERIN  (NITROSTAT ) 0.4 MG SL tablet Place 1 tablet (0.4 mg total) under the tongue every 5 (five) minutes as needed for chest pain. 25 tablet 1   rosuvastatin  (CRESTOR ) 40 MG tablet TAKE 1 TABLET(40 MG) BY MOUTH DAILY 90 tablet 3   No facility-administered medications prior to visit.    PAST MEDICAL HISTORY: Past Medical History:  Diagnosis Date   Anxiety    Cataract    Chronic insomnia 10/13/2016   Depression    Gait disorder 03/14/2014   HTN (hypertension)    Hx of cardiovascular stress test    Lexiscan  Myoview (1/16):  No ischemia, EF 68%, breast atten; Low Risk   Hyperlipidemia    Phreesia 07/18/2020   Hypertension    Phreesia 07/18/2020   Memory difficulty 03/14/2014   Meningitis due to  bacteria 01/18/2014   OSA (obstructive sleep apnea) 11/30/2016   Seizures (HCC)    Sleep apnea    Phreesia 07/18/2020   TIA (transient ischemic attack)     PAST SURGICAL HISTORY: Past Surgical History:  Procedure Laterality Date   NO PAST SURGERIES     PACEMAKER IMPLANT N/A 09/13/2023   Procedure: PACEMAKER IMPLANT;  Surgeon: Nancey Eulas BRAVO, MD;  Location: [facility];  Service: Cardiovascular;  Laterality: N/A;    FAMILY HISTORY: Family History  Problem Relation Age of Onset   Heart attack Mother 34   Stroke Mother    Hypertension Mother    Heart attack Father 39   Cerebral aneurysm Sister    Hypertension Sister    Kidney disease Sister        ESRD; HD  in last year   Hypertension Brother    Hyperlipidemia Brother    Rheum arthritis Sister    Arthritis Sister    Hypertension Brother    Hyperlipidemia Brother    Hyperlipidemia Brother    Hypertension Brother     SOCIAL HISTORY: Social History   Socioeconomic History   Marital status: Single    Spouse name: Not on file   Number of children: 1   Years of education: college   Highest education level: Some college, no degree  Occupational History   Occupation: Retired Financial planner: [employer]  Tobacco Use   Smoking status: Former    Current packs/day: 0.00    Average packs/day: 0.5 packs/day for 40.0 years (20.0 ttl pk-yrs)    Types: Cigarettes    Start date: 07/09/1971    Quit date: 07/09/2011    Years since quitting: 12.6   Smokeless tobacco: Never  Vaping Use   Vaping status: Never Used  Substance and Sexual Activity   Alcohol use: Never   Drug use: No   Sexual activity: Never    Birth control/protection: Abstinence  Other Topics Concern   Not on file  Social History Narrative   Marital status: divorced x 35 years; not dating; not interested in 2019.      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      Tobacco: quit smoking in 2013.  Smoked x many years       Alcohol: none; holidays only      Exercise: none; quit exercising two years ago in 2015.        Seatbelt: 100%; no texting. Does not drive in [address]80 due to seizure activity.       ADLs: no driving in [place]81[place] due to seizure activity; independent with ADLs.  Has cane for long distances      Advanced Directives: none; [name]/sister.  FULL CODE no prolonged measures.         Patient is single lives at home alone, has 1 child   Patient is right handed   Education level is 1 year of college   Caffeine consumption is 2 cups daily   Social Drivers of Health   Financial Resource Strain: Low Risk  (02/07/2024)   Overall Financial Resource Strain (CARDIA)    Difficulty of Paying Living Expenses: Not hard at all  Food Insecurity: No Food Insecurity (02/07/2024)   Hunger Vital Sign    Worried About Running Out of Food in the Last Year: Never true    Ran Out of Food in the Last Year: Never true  Transportation Needs: No Transportation Needs (02/07/2024)   PRAPARE - Administrator, Civil Service (Medical): No    Lack of Transportation (Non-Medical): No  Physical Activity: Inactive (02/07/2024)   Exercise Vital Sign    Days of Exercise per Week: 0 days    Minutes of Exercise per Session: Not on file  Stress: No Stress Concern Present (02/07/2024)   [employer] of Occupational Health - Occupational Stress Questionnaire    Feeling of Stress: Only a little  Social Connections: Moderately Integrated (02/07/2024)   Social Connection and Isolation Panel    Frequency of Communication with Friends and Family: More than three times a week    Frequency of Social Gatherings with Friends and Family: Once a week  Attends Religious Services: More than 4 times per year    Active Member of Clubs or Organizations: Yes    Attends Banker Meetings: More than 4 times per year     Marital Status: Divorced  Intimate Partner Violence: Patient Unable To Answer (10/20/2023)   Humiliation, Afraid, Rape, and Kick questionnaire    Fear of Current or Ex-Partner: Patient unable to answer    Emotionally Abused: Patient unable to answer    Physically Abused: Patient unable to answer    Sexually Abused: Patient unable to answer   PHYSICAL EXAM  Vitals:   02/14/24 1536  BP: (!) 118/58  Pulse: 71  Resp: 17  SpO2: 96%  Weight: 261 lb (118.4 kg)  Height: 5' 5 (1.651 m)   Body mass index is 43.43 kg/m.  Generalized: Well developed, in no acute distress   Neurological examination  Mentation: Alert oriented to time, place, history taking. Follows all commands speech and language fluent Cranial nerve II-XII: Pupils were equal round reactive to light. Extraocular movements were full, visual field were full on confrontational test. Facial sensation and strength were normal. Head turning and shoulder shrug  were normal and symmetric. Motor: The motor testing reveals 5 over 5 strength of all 4 extremities. Good symmetric motor tone is noted throughout.  Sensory: Sensory testing is intact to soft touch on all 4 extremities. No evidence of extinction is noted.  Coordination: Cerebellar testing reveals good finger-nose-finger and heel-to-shin bilaterally.  Gait and station: Gait is normal.  Reflexes: Deep tendon reflexes are symmetric but decreased throughout  DIAGNOSTIC DATA (LABS, IMAGING, TESTING) - I reviewed patient records, labs, notes, testing and imaging myself where available.  Lab Results  Component Value Date   WBC 3.3 (L) 02/08/2024   HGB 10.1 (L) 02/08/2024   HCT 31.6 (L) 02/08/2024   MCV 81.0 02/08/2024   PLT 184.0 02/08/2024      Component Value Date/Time   NA 143 02/08/2024 1112   NA 142 11/02/2023 1458   K 4.2 02/08/2024 1112   CL 106 02/08/2024 1112   CO2 30 02/08/2024 1112   GLUCOSE 97 02/08/2024 1112   BUN 31 (H) 02/08/2024 1112   BUN 30 (H)  11/02/2023 1458   CREATININE 0.88 02/08/2024 1112   CREATININE 0.89 10/28/2015 1004   CALCIUM  9.0 02/08/2024 1112   PROT 6.4 02/08/2024 1112   PROT 5.9 (L) 10/28/2022 1132   ALBUMIN 4.1 02/08/2024 1112   ALBUMIN 4.0 10/28/2022 1132   AST 23 02/08/2024 1112   ALT 19 02/08/2024 1112   ALKPHOS 88 02/08/2024 1112   BILITOT 0.2 02/08/2024 1112   BILITOT <0.2 10/28/2022 1132   GFRNONAA 56 (L) 09/08/2023 0404   GFRNONAA 79 10/17/2013 1327   GFRAA 81 04/14/2020 1609   GFRAA >89 10/17/2013 1327   Lab Results  Component Value Date   CHOL 169 02/08/2024   HDL 69.40 02/08/2024   LDLCALC 82 02/08/2024   TRIG 88.0 02/08/2024   CHOLHDL 2 02/08/2024   Lab Results  Component Value Date   HGBA1C 6.6 (H) 02/08/2024   Lab Results  Component Value Date   VITAMINB12 388 01/05/2023   Lab Results  Component Value Date   TSH 1.77 02/08/2024   ASSESSMENT AND PLAN 73 y.o. year old female  1.  Episodes of transient alteration of awareness, probable seizures -Doing well, last seizure spell in November 2021 -Continue current dose of Keppra  and carbamazepine ,   2.  OSA on CPAP (Setup July 2025) -  Continue nightly usage minimum 4 hours.  Has excellent compliance.  Continue current settings.  Work with DME to see if straps can be switched, currently too tight, using FFM -HST 11/08/2023 showed severe OSA with total AHI 30.8/hour and O2 nadir of 78% with significant time below or at 88% saturation of over 15 minutes for the study, indicating nocturnal hypoxemia.  There was a mild or borderline central apnea component as well. -PSG in 2018 showed severe OSA with total AHI 48.3/hour, REM AHI 110.9/hour -Keep follow up appointment in July 2026  Lauraine Born, SCHARLENE, DNP 02/14/2024, 3:54 PM [facility] [address], [address] [place], [place] [address] [phone]

## 2024-02-14 NOTE — Patient Instructions (Signed)
 Great to see you today! Continue CPAP usage minimum 4 hours nightly Continue current settings Continue to replace supplies routinely through DME Follow-up in 1 year or sooner if needed. Thanks!!

## 2024-02-21 ENCOUNTER — Encounter: Payer: Self-pay | Admitting: Gastroenterology

## 2024-02-21 ENCOUNTER — Ambulatory Visit: Admitting: Gastroenterology

## 2024-02-21 VITALS — BP 116/64 | HR 60 | Ht 65.0 in | Wt 261.2 lb

## 2024-02-21 DIAGNOSIS — K641 Second degree hemorrhoids: Secondary | ICD-10-CM | POA: Diagnosis not present

## 2024-02-21 DIAGNOSIS — Z860100 Personal history of colon polyps, unspecified: Secondary | ICD-10-CM

## 2024-02-21 DIAGNOSIS — R194 Change in bowel habit: Secondary | ICD-10-CM

## 2024-02-21 NOTE — Progress Notes (Unsigned)
 Chief Complaint:    Symptomatic Internal Hemorrhoids; Hemorrhoid Band Ligation  GI History:  HPI:     Patient is a 73 y.o. femalewith a history of symptomatic internal hemorrhoids presenting to the [facility] for follow-up and ongoing treatment. The patient presents with symptomatic grade ***  hemorrhoids, unresponsive to maximal medical therapy, requesting rubber band ligation of *** hemorrhoidal disease.  Did well   RP***  No change in medical or surgical history, medications, allergies, social history since last appointment with me.   Review of systems:     No chest pain, no SOB, no fevers, no urinary sx   Past Medical History:  Diagnosis Date   Anxiety    Cataract    Chronic insomnia 10/13/2016   Depression    Gait disorder 03/14/2014   HTN (hypertension)    Hx of cardiovascular stress test    Lexiscan  Myoview (1/16):  No ischemia, EF 68%, breast atten; Low Risk   Hyperlipidemia    Phreesia 07/18/2020   Hypertension    Phreesia 07/18/2020   Memory difficulty 03/14/2014   Meningitis due to bacteria 01/18/2014   OSA (obstructive sleep apnea) 11/30/2016   Seizures (HCC)    Sleep apnea    Phreesia 07/18/2020   TIA (transient ischemic attack)     Patient's surgical history, family medical history, social history, medications and allergies were all reviewed in Epic    Current Outpatient Medications  Medication Sig Dispense Refill   acetaminophen  (TYLENOL ) 325 MG tablet Take 650 mg by mouth at bedtime as needed for moderate pain (pain score 4-6).     aspirin  81 MG chewable tablet Chew 1 tablet (81 mg total) by mouth daily.     carbamazepine  (TEGRETOL ) 200 MG tablet TAKE 1 AND 1/2 TABLETS BY MOUTH TWO TIMES A DAY 270 tablet 4   Cholecalciferol (VITAMIN D -3 PO) Take 1 capsule by mouth daily after supper.     escitalopram  (LEXAPRO ) 10 MG tablet TAKE 1 TABLET(10 MG) BY MOUTH AT BEDTIME 90 tablet 1   ezetimibe  (ZETIA ) 10 MG tablet TAKE 1 TABLET(10 MG) BY  MOUTH DAILY 90 tablet 3   levETIRAcetam  (KEPPRA ) 750 MG tablet TAKE 2 TABLETS(1500 MG) BY MOUTH TWICE DAILY 360 tablet 4   lisinopril -hydrochlorothiazide  (ZESTORETIC ) 20-25 MG tablet Take 1 tablet by mouth daily. 90 tablet 1   Multiple Vitamins-Minerals (MULTIVITAMIN WOMEN 50+) TABS Take 1 tablet by mouth daily after supper.     nitroGLYCERIN  (NITROSTAT ) 0.4 MG SL tablet Place 1 tablet (0.4 mg total) under the tongue every 5 (five) minutes as needed for chest pain. 25 tablet 1   rosuvastatin  (CRESTOR ) 40 MG tablet TAKE 1 TABLET(40 MG) BY MOUTH DAILY 90 tablet 3   No current facility-administered medications for this visit.    Physical Exam:     BP 116/64 (BP Location: Left Arm, Patient Position: Bed low/side rails up;Sitting, Cuff Size: Large)   Pulse 60   Ht 5' 5 (1.651 m)   Wt 261 lb 4 oz (118.5 kg)   BMI 43.47 kg/m   GENERAL:  Pleasant *** in NAD PSYCH: : Cooperative, normal affect EENT:  conjunctiva pink, mucous membranes moist, neck supple without masses CARDIAC:  RRR, no murmur heard, no peripheral edema PULM: Normal respiratory effort, lungs CTA bilaterally, no wheezing ABDOMEN:  Nondistended, soft, nontender. No obvious masses, no hepatomegaly,  normal bowel sounds SKIN:  turgor, no lesions seen Musculoskeletal:  Normal muscle tone, normal strength NEURO: Alert and oriented x 3, no focal neurologic deficits  Rectal exam: Sensation intact and preserved anal wink.  Grade 2 hemorrhoids noted in all positions on anoscopy.  No external anal fissures noted. Normal sphincter tone. No palpable mass. No blood on the exam glove. (Chaperone: Nat Sic, CMA).   IMPRESSION and PLAN:    #1.  Symptomatic internal hemorrhoids: PROCEDURE NOTE: The patient presents with symptomatic grade ***  hemorrhoids, unresponsive to maximal medical therapy, requesting rubber band ligation of *** hemorrhoidal disease.  All risks, benefits and alternative forms of therapy were described and informed  consent was obtained.  In the Left Lateral Decubitus position, anoscopic examination revealed grade *** hemorrhoids in the *** position(s).  The anorectum was pre-medicated with RectiCare. The decision was made to band the *** internal hemorrhoid, and the [facility] System was used to perform band ligation without complication.  Digital anorectal examination was then performed to assure proper positioning of the band, and to adjust the banded tissue as required.  The patient was discharged home without pain or other issues.  Dietary and behavioral recommendations were given and along with follow-up instructions.     The following adjunctive treatments were recommended:  -Resume high-fiber diet with fiber supplement (i.e. Citrucel or Benefiber) with goal for soft stools without straining to have a BM. -Resume adequate fluid intake.  The patient will return in 4+ weeks for follow-up and possible additional banding as required. No complications were encountered and the patient tolerated the procedure well.      #[id number] Sandor LULLA San ,DO, FACG 02/21/2024, 11:37 AM

## 2024-02-21 NOTE — Patient Instructions (Signed)
 _______________________________________________________  If your blood pressure at your visit was 140/90 or greater, please contact your primary care physician to follow up on this.  _______________________________________________________  If you are age 73 or older, your body mass index should be between 23-30. Your Body mass index is 43.47 kg/m. If this is out of the aforementioned range listed, please consider follow up with your Primary Care Provider.  If you are age 73 or younger, your body mass index should be between 19-25. Your Body mass index is 43.47 kg/m. If this is out of the aformentioned range listed, please consider follow up with your Primary Care Provider.   ________________________________________________________  The [facility] providers would like to encourage you to use MYCHART to communicate with providers for non-urgent requests or questions.  Due to long hold times on the telephone, sending your provider a message by [facility] may be a faster and more efficient way to get a response.  Please allow 48 business hours for a response.  Please remember that this is for non-urgent requests.  _______________________________________________________  [facility] is using a team-based approach to care.  Your team is made up of your doctor and two to three APPS. Our APPS (Nurse Practitioners and Physician Assistants) work with your physician to ensure care continuity for you. They are fully qualified to address your health concerns and develop a treatment plan. They communicate directly with your gastroenterologist to care for you. Seeing the Advanced Practice Practitioners on your physician's team can help you by facilitating care more promptly, often allowing for earlier appointments, access to diagnostic testing, procedures, and other specialty referrals.   HEMORRHOID BANDING PROCEDURE    FOLLOW-UP CARE   The procedure you have had should have been relatively  painless since the banding of the area involved does not have nerve endings and there is no pain sensation.  The rubber band cuts off the blood supply to the hemorrhoid and the band may fall off as soon as 48 hours after the banding (the band may occasionally be seen in the toilet bowl following a bowel movement). You may notice a temporary feeling of fullness in the rectum which should respond adequately to plain Tylenol  or Motrin.  Following the banding, avoid strenuous exercise that evening and resume full activity the next day.  A sitz bath (soaking in a warm tub) or bidet is soothing, and can be useful for cleansing the area after bowel movements.     To avoid constipation, take two tablespoons of natural wheat bran, natural oat bran, flax, Benefiber or any over the counter fiber supplement and increase your water intake to 7-8 glasses daily.    Unless you have been prescribed anorectal medication, do not put anything inside your rectum for two weeks: No suppositories, enemas, fingers, etc.  Occasionally, you may have more bleeding than usual after the banding procedure.  This is often from the untreated hemorrhoids rather than the treated one.  Don't be concerned if there is a tablespoon or so of blood.  If there is more blood than this, lie flat with your bottom higher than your head and apply an ice pack to the area. If the bleeding does not stop within a half an hour or if you feel faint, call our office at [phone] or go to the emergency room.  Problems are not common; however, if there is a substantial amount of bleeding, severe pain, chills, fever or difficulty passing urine (very rare) or other problems, you should  call us  at [phone] or report to the nearest emergency room.  Do not stay seated continuously for more than 2-3 hours for a day or two after the procedure.  Tighten your buttock muscles 10-15 times every two hours and take 10-15 deep breaths every 1-2 hours.  Do  not spend more than a few minutes on the toilet if you cannot empty your bowel; instead re-visit the toilet at a later time.   It was a pleasure to see you today!  Vito Cirigliano, D.O.

## 2024-02-23 ENCOUNTER — Encounter: Payer: Self-pay | Admitting: *Deleted

## 2024-02-23 NOTE — Progress Notes (Signed)
 Irene Collings                                          MRN: [id number]   02/23/2024   The [facility] Quality Team Specialist reviewed this patient medical record for the purposes of chart review for care gap closure. The following were reviewed: abstraction for care gap closure-glycemic status assessment.    [facility] Quality Team

## 2024-02-24 ENCOUNTER — Telehealth: Payer: Self-pay | Admitting: Emergency Medicine

## 2024-02-24 MED ORDER — ESCITALOPRAM OXALATE 10 MG PO TABS: ORAL_TABLET | ORAL | 1 refills | Status: AC

## 2024-02-24 NOTE — Telephone Encounter (Signed)
 Copied from CRM #[id number]. Topic: Clinical - Medication Refill >> Feb 24, 2024 11:49 AM Rea ORN wrote: Medication:  escitalopram  (LEXAPRO ) 10 MG tablet   Has the patient contacted their pharmacy? Yes (Agent: If no, request that the patient contact the pharmacy for the refill. If patient does not wish to contact the pharmacy document the reason why and proceed with request.) (Agent: If yes, when and what did the pharmacy advise?)  This is the patient's preferred pharmacy:  [id number] - [place], [place] - [address] AT [address] [facility] [address] [place] [address] Phone: [phone] Fax: [phone]  Is this the correct pharmacy for this prescription? Yes If no, delete pharmacy and type the correct one.   Has the prescription been filled recently? No  Is the patient out of the medication? No  Has the patient been seen for an appointment in the last year OR does the patient have an upcoming appointment? Yes  Can we respond through MyChart? No  Agent: Please be advised that Rx refills may take up to 3 business days. We ask that you follow-up with your pharmacy.

## 2024-03-24 ENCOUNTER — Other Ambulatory Visit: Payer: Self-pay | Admitting: Cardiology

## 2024-04-05 ENCOUNTER — Ambulatory Visit: Attending: Cardiology | Admitting: Cardiology

## 2024-04-05 ENCOUNTER — Encounter: Payer: Self-pay | Admitting: Cardiology

## 2024-04-05 VITALS — BP 120/64 | HR 66 | Ht 65.5 in | Wt 260.4 lb

## 2024-04-05 DIAGNOSIS — Z950 Presence of cardiac pacemaker: Secondary | ICD-10-CM

## 2024-04-05 DIAGNOSIS — I10 Essential (primary) hypertension: Secondary | ICD-10-CM | POA: Diagnosis not present

## 2024-04-05 DIAGNOSIS — R002 Palpitations: Secondary | ICD-10-CM | POA: Diagnosis not present

## 2024-04-05 DIAGNOSIS — I2510 Atherosclerotic heart disease of native coronary artery without angina pectoris: Secondary | ICD-10-CM | POA: Diagnosis not present

## 2024-04-05 DIAGNOSIS — E785 Hyperlipidemia, unspecified: Secondary | ICD-10-CM

## 2024-04-05 NOTE — Patient Instructions (Signed)
 Medication Instructions:  No changes *If you need a refill on your cardiac medications before your next appointment, please call your pharmacy*  Lab Work: None ordered If you have labs (blood work) drawn today and your tests are completely normal, you will receive your results only by: MyChart Message (if you have MyChart) OR A paper copy in the mail If you have any lab test that is abnormal or we need to change your treatment, we will call you to review the results.  Testing/Procedures: None ordered  Follow-Up: At [facility], you and your health needs are our priority.  As part of our continuing mission to provide you with exceptional heart care, our providers are all part of one team.  This team includes your primary Cardiologist (physician) and Advanced Practice Providers or APPs (Physician Assistants and Nurse Practitioners) who all work together to provide you with the care you need, when you need it.  Your next appointment:   PharmD- for Lipids- next available appointment  Dr Kate- 6 months  We recommend signing up for the patient portal called MyChart.  Sign up information is provided on this After Visit Summary.  MyChart is used to connect with patients for Virtual Visits (Telemedicine).  Patients are able to view lab/test results, encounter notes, upcoming appointments, etc.  Non-urgent messages can be sent to your provider as well.   To learn more about what you can do with MyChart, go to [email or web address].

## 2024-04-05 NOTE — Progress Notes (Signed)
 Cardiology Office Note:    Date:  04/05/2024   ID:  [name] [name], DOB August 18, 1950, MRN [id number]  PCP:  Purcell Emil Schanz, MD  Cardiologist:  Lonni LITTIE Nanas, MD  Electrophysiologist:  None   Referring MD: Purcell Emil Schanz, *   Chief Complaint  Patient presents with   Fatigue    History of Present Illness:    [name] [name] is a 73 y.o. female with a hx of SSS status post PPM, hypertension, hyperlipidemia, OSA, TIA, bacterial meningitis, seizures who presents for follow-up.  She was referred by Dr. Sagardia for evaluation of chest pain and dyspnea on exertion, initially seen on 12/21/2021.    Echocardiogram 01/01/2022 showed normal biventricular function, no significant valvular disease.  Coronary CTA on 01/13/2022 showed calcium  score 572 (95th percentile), moderate disease in RCA and LCx, no significant CAD by CT FFR.  She was noted at clinic visit 04/2022 to be in junctional rhythm with rate in 40s.  She was on carvedilol  which was discontinued.  Zio patch x 8 days on 04/29/2022 showed episodes of junctional rhythm, but normal rate.  Zio patch x 7 days 02/2023 showed occasional PVCs (1.7%), junctional rhythm present but no symptoms reported.  She underwent pacemaker with Dr. Nancey on 09/13/2023.  Since last clinic visit, she reports she is doing okay.  Does report having some fatigue. Denies any chest pain, dyspnea, lightheadedness, syncope, or palpitations.  Reports some ankle edema if she eats something salty.  Reports compliance with CPAP.  Past Medical History:  Diagnosis Date   Anxiety    Cataract    Chronic insomnia 10/13/2016   Depression    Gait disorder 03/14/2014   HTN (hypertension)    Hx of cardiovascular stress test    Lexiscan  Myoview (1/16):  No ischemia, EF 68%, breast atten; Low Risk   Hyperlipidemia    Phreesia 07/18/2020   Hypertension    Phreesia 07/18/2020   Memory difficulty 03/14/2014   Meningitis due to bacteria 01/18/2014    OSA (obstructive sleep apnea) 11/30/2016   Seizures (HCC)    Sleep apnea    Phreesia 07/18/2020   TIA (transient ischemic attack)     Past Surgical History:  Procedure Laterality Date   NO PAST SURGERIES     PACEMAKER IMPLANT N/A 09/13/2023   Procedure: PACEMAKER IMPLANT;  Surgeon: Nancey Eulas BRAVO, MD;  Location: [facility];  Service: Cardiovascular;  Laterality: N/A;    Current Medications: Current Meds  Medication Sig   acetaminophen  (TYLENOL ) 325 MG tablet Take 650 mg by mouth at bedtime as needed for moderate pain (pain score 4-6).   aspirin  81 MG chewable tablet Chew 1 tablet (81 mg total) by mouth daily.   carbamazepine  (TEGRETOL ) 200 MG tablet TAKE 1 AND 1/2 TABLETS BY MOUTH TWO TIMES A DAY   Cholecalciferol (VITAMIN D -3 PO) Take 1 capsule by mouth daily after supper.   escitalopram  (LEXAPRO ) 10 MG tablet TAKE 1 TABLET(10 MG) BY MOUTH AT BEDTIME   ezetimibe  (ZETIA ) 10 MG tablet TAKE 1 TABLET(10 MG) BY MOUTH DAILY   levETIRAcetam  (KEPPRA ) 750 MG tablet TAKE 2 TABLETS(1500 MG) BY MOUTH TWICE DAILY   lisinopril -hydrochlorothiazide  (ZESTORETIC ) 20-25 MG tablet Take 1 tablet by mouth daily.   Multiple Vitamins-Minerals (MULTIVITAMIN WOMEN 50+) TABS Take 1 tablet by mouth daily after supper.   rosuvastatin  (CRESTOR ) 40 MG tablet TAKE 1 TABLET(40 MG) BY MOUTH DAILY     Allergies:   Nsaids   Social History   Socioeconomic  History   Marital status: Single    Spouse name: Not on file   Number of children: 1   Years of education: college   Highest education level: Some college, no degree  Occupational History   Occupation: Retired Financial Planner: [employer]  Tobacco Use   Smoking status: Former    Current packs/day: 0.00    Average packs/day: 0.5 packs/day for 40.0 years (20.0 ttl pk-yrs)    Types: Cigarettes    Start date: 07/09/1971    Quit date: 07/09/2011    Years since quitting: 12.7   Smokeless tobacco: Never  Vaping Use   Vaping status: Never Used  Substance  and Sexual Activity   Alcohol use: Never   Drug use: No   Sexual activity: Never    Birth control/protection: Abstinence  Other Topics Concern   Not on file  Social History Narrative   Marital status: divorced x 35 years; not dating; not interested in 2019.      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      Tobacco: quit smoking in 2013.  Smoked x many years       Alcohol: none; holidays only      Exercise: none; quit exercising two years ago in 2015.        Seatbelt: 100%; no texting. Does not drive in [address]80 due to seizure activity.       ADLs: no driving in [place]81[place] due to seizure activity; independent with ADLs.  Has cane for long distances      Advanced Directives: none; [name]/sister.  FULL CODE no prolonged measures.         Patient is single lives at home alone, has 1 child   Patient is right handed   Education level is 1 year of college   Caffeine consumption is 2 cups daily   Social Drivers of Health   Financial Resource Strain: Low Risk  (02/07/2024)   Overall Financial Resource Strain (CARDIA)    Difficulty of Paying Living Expenses: Not hard at all  Food Insecurity: No Food Insecurity (02/07/2024)   Hunger Vital Sign    Worried About Running Out of Food in the Last Year: Never true    Ran Out of Food in the Last Year: Never true  Transportation Needs: No Transportation Needs (02/07/2024)   PRAPARE - Administrator, Civil Service (Medical): No    Lack of Transportation (Non-Medical): No  Physical Activity: Inactive (02/07/2024)   Exercise Vital Sign    Days of Exercise per Week: 0 days    Minutes of Exercise per Session: Not on file  Stress: No Stress Concern Present (02/07/2024)   [employer] of Occupational Health - Occupational Stress Questionnaire    Feeling of Stress: Only a little  Social Connections:  Moderately Integrated (02/07/2024)   Social Connection and Isolation Panel    Frequency of Communication with Friends and Family: More than three times a week    Frequency of Social Gatherings with Friends and Family: Once a week    Attends Religious Services: More than 4 times per year    Active Member of [employer] or Organizations: Yes    Attends Engineer, Structural: More than 4 times per year    Marital Status: Divorced     Family History: The patient's family history includes  Arthritis in her sister; Cerebral aneurysm in her sister; Heart attack (age of onset: 48) in her father; Heart attack (age of onset: 37) in her mother; Hyperlipidemia in her brother, brother, and brother; Hypertension in her brother, brother, brother, mother, and sister; Kidney disease in her sister; Rheum arthritis in her sister; Stroke in her mother.  ROS:   Please see the history of present illness.     All other systems reviewed and are negative.  EKGs/Labs/Other Studies Reviewed:    The following studies were reviewed today:   EKG:   02/20/2023: Sinus bradycardia with junctional beats, rate 44 05/24/22: sinus bradycardia, rate 54, no ST abnormality 04/12/22: Junctional rhythm heart rate 45, no ST abnormality 12/21/2021: Sinus bradycardia, rate 54, first-degree AV block 06/28/23: Sinus bradycardia, rate 39 04/05/24: NSR, rate 66, first degree AV blokc.  Recent Labs: 09/08/2023: Magnesium  1.9 02/08/2024: ALT 19; BUN 31; Creatinine, Ser 0.88; Hemoglobin 10.1; Platelets 184.0; Potassium 4.2; Sodium 143; TSH 1.77  Recent Lipid Panel    Component Value Date/Time   CHOL 169 02/08/2024 1112   CHOL 174 08/17/2022 1027   TRIG 88.0 02/08/2024 1112   HDL 69.40 02/08/2024 1112   HDL 85 08/17/2022 1027   CHOLHDL 2 02/08/2024 1112   VLDL 17.6 02/08/2024 1112   LDLCALC 82 02/08/2024 1112   LDLCALC 76 08/17/2022 1027    Physical Exam:    VS:  BP 120/64 (BP Location: Left Arm, Patient Position: Sitting,  Cuff Size: Large)   Pulse 66   Ht 5' 5.5 (1.664 m)   Wt 260 lb 6.4 oz (118.1 kg)   SpO2 95%   BMI 42.67 kg/m     Wt Readings from Last 3 Encounters:  04/05/24 260 lb 6.4 oz (118.1 kg)  02/21/24 261 lb 4 oz (118.5 kg)  02/14/24 261 lb (118.4 kg)     GEN: Well nourished, well developed in no acute distress HEENT: Normal NECK: No JVD; No carotid bruits LYMPHATICS: No lymphadenopathy CARDIAC: RRR, no murmurs, rubs, gallops RESPIRATORY:  Clear to auscultation without rales, wheezing or rhonchi  ABDOMEN: Soft, non-tender, non-distended MUSCULOSKELETAL:  No edema; No deformity  SKIN: Warm and dry NEUROLOGIC:  Alert and oriented x 3 PSYCHIATRIC:  Normal affect   ASSESSMENT:    1. Coronary artery disease involving native coronary artery of native heart without angina pectoris   2. Palpitations   3. Cardiac pacemaker in situ   4. Essential hypertension   5. Hyperlipidemia LDL goal <70       PLAN:    SSS s/p PPM: She was noted at clinic visit 04/2022 to be in junctional rhythm with rate in 40s.  She was on carvedilol  which was discontinued.  Zio patch x 8 days on 04/29/2022 showed episodes of junctional rhythm, but normal rate.  At clinic appointment 06/2023, was in sinus bradycardia with rate 39.  She reported having significant fatigue, suspect symptomatic bradycardia.  Referred to EP and underwent PPM placement with Dr. Nancey 09/2023, reports significant improvement in fatigue since pacemaker placement  CAD: reported chest pain with possible angina, as describes central chest pressure that can occur with exertion, though not reliably brought on by exertion and can also occur at rest.  She does have significant CAD risk factors (age, former tobacco use, hypertension, hyperlipidemia, family history).  Coronary CTA on 01/13/2022 showed calcium  score 572 (95th percentile), moderate disease in RCA and LCx, not significant CAD by CT FFR.  Echocardiogram 09/2023 showed EF 65 to 70%, mild RV  dysfunction, no significant valvular disease. -Continue ASA -Continue rosuvastatin  and Zetia   Hypertension: On lisinopril -HCTZ 20-25 mg daily.  Appears controlled.  Hyperlipidemia: On rosuvastatin  40 mg daily, LDL 95 on 04/12/2022.  Zetia  10 mg daily added.  Reported worsening palpitations when she started Zetia  so she stopped taking.  Not clear that this was related to Zetia  use.  Rechallenged with zetia  and reports tolerating well.  LDL 82 on 02/08/2024 - Given significantly elevated calcium  score and LDL remains above goal less than 70 despite max dose rosuvastatin  and Zetia , recommend referral to pharmacy lipid clinic to consider PCSK9 inhibitor  T2DM: A1c 6.6% on 02/2024.  Stopped taking metformin   OSA: on CPAP, reports compliance   RTC in 6 months   Medication Adjustments/Labs and Tests Ordered: Current medicines are reviewed at length with the patient today.  Concerns regarding medicines are outlined above.  Orders Placed This Encounter  Procedures   AMB Referral to [facility]   EKG 12-Lead   No orders of the defined types were placed in this encounter.   Patient Instructions  Medication Instructions:  No changes *If you need a refill on your cardiac medications before your next appointment, please call your pharmacy*  Lab Work: None ordered If you have labs (blood work) drawn today and your tests are completely normal, you will receive your results only by: MyChart Message (if you have MyChart) OR A paper copy in the mail If you have any lab test that is abnormal or we need to change your treatment, we will call you to review the results.  Testing/Procedures: None ordered  Follow-Up: At [facility], you and your health needs are our priority.  As part of our continuing mission to provide you with exceptional heart care, our providers are all part of one team.  This team includes your primary Cardiologist (physician) and Advanced Practice Providers or  APPs (Physician Assistants and Nurse Practitioners) who all work together to provide you with the care you need, when you need it.  Your next appointment:   PharmD- for Lipids- next available appointment  Dr Kate- 6 months  We recommend signing up for the patient portal called MyChart.  Sign up information is provided on this After Visit Summary.  MyChart is used to connect with patients for Virtual Visits (Telemedicine).  Patients are able to view lab/test results, encounter notes, upcoming appointments, etc.  Non-urgent messages can be sent to your provider as well.   To learn more about what you can do with MyChart, go to [email or web address].      Signed, Lonni LITTIE Kate, MD  04/05/2024 1:49 PM    [facility]

## 2024-04-12 ENCOUNTER — Encounter: Payer: Self-pay | Admitting: Gastroenterology

## 2024-04-12 ENCOUNTER — Ambulatory Visit: Admitting: Gastroenterology

## 2024-04-12 VITALS — Ht 65.5 in | Wt 259.5 lb

## 2024-04-12 DIAGNOSIS — Z860100 Personal history of colon polyps, unspecified: Secondary | ICD-10-CM | POA: Diagnosis not present

## 2024-04-12 DIAGNOSIS — K5900 Constipation, unspecified: Secondary | ICD-10-CM

## 2024-04-12 DIAGNOSIS — K641 Second degree hemorrhoids: Secondary | ICD-10-CM

## 2024-04-12 NOTE — Patient Instructions (Signed)
 _______________________________________________________  If your blood pressure at your visit was 140/90 or greater, please contact your primary care physician to follow up on this.  _______________________________________________________  If you are age 73 or older, your body mass index should be between 23-30. Your Body mass index is 42.53 kg/m. If this is out of the aforementioned range listed, please consider follow up with your Primary Care Provider.  If you are age 33 or younger, your body mass index should be between 19-25. Your Body mass index is 42.53 kg/m. If this is out of the aformentioned range listed, please consider follow up with your Primary Care Provider.   ________________________________________________________  The [facility] providers would like to encourage you to use MYCHART to communicate with providers for non-urgent requests or questions.  Due to long hold times on the telephone, sending your provider a message by [facility] may be a faster and more efficient way to get a response.  Please allow 48 business hours for a response.  Please remember that this is for non-urgent requests.  _______________________________________________________  [facility] is using a team-based approach to care.  Your team is made up of your doctor and two to three APPS. Our APPS (Nurse Practitioners and Physician Assistants) work with your physician to ensure care continuity for you. They are fully qualified to address your health concerns and develop a treatment plan. They communicate directly with your gastroenterologist to care for you. Seeing the Advanced Practice Practitioners on your physician's team can help you by facilitating care more promptly, often allowing for earlier appointments, access to diagnostic testing, procedures, and other specialty referrals.    HEMORRHOID BANDING PROCEDURE    FOLLOW-UP CARE   The procedure you have had should have been relatively  painless since the banding of the area involved does not have nerve endings and there is no pain sensation.  The rubber band cuts off the blood supply to the hemorrhoid and the band may fall off as soon as 48 hours after the banding (the band may occasionally be seen in the toilet bowl following a bowel movement). You may notice a temporary feeling of fullness in the rectum which should respond adequately to plain Tylenol  or Motrin.  Following the banding, avoid strenuous exercise that evening and resume full activity the next day.  A sitz bath (soaking in a warm tub) or bidet is soothing, and can be useful for cleansing the area after bowel movements.     To avoid constipation, take two tablespoons of natural wheat bran, natural oat bran, flax, Benefiber or any over the counter fiber supplement and increase your water intake to 7-8 glasses daily.    Unless you have been prescribed anorectal medication, do not put anything inside your rectum for two weeks: No suppositories, enemas, fingers, etc.  Occasionally, you may have more bleeding than usual after the banding procedure.  This is often from the untreated hemorrhoids rather than the treated one.  Dont be concerned if there is a tablespoon or so of blood.  If there is more blood than this, lie flat with your bottom higher than your head and apply an ice pack to the area. If the bleeding does not stop within a half an hour or if you feel faint, call our office at [phone] or go to the emergency room.  Problems are not common; however, if there is a substantial amount of bleeding, severe pain, chills, fever or difficulty passing urine (very rare) or other problems, you  should call us  at [phone] or report to the nearest emergency room.  Do not stay seated continuously for more than 2-3 hours for a day or two after the procedure.  Tighten your buttock muscles 10-15 times every two hours and take 10-15 deep breaths every 1-2 hours.  Do  not spend more than a few minutes on the toilet if you cannot empty your bowel; instead re-visit the toilet at a later time.  It was a pleasure to see you today!  Vito Cirigliano, D.O.

## 2024-04-12 NOTE — Progress Notes (Signed)
 Chief Complaint:    Symptomatic Internal Hemorrhoids; Hemorrhoid Band Ligation  GI History: 73 year old female with history of symptomatic bradycardia now s/p pacemaker placement in 09/2023, HTN, HLD, OSA, TIA, CKD 3, seizure disorder, diabetes, initially seen during hospital admission 09/2023 with hematochezia.  Admission Hgb 9.7 (baseline ~10-11).  CT angiography negative for active bleeding and otherwise normal-appearing GI tract.  Inpatient colonoscopy was deferred due to stable hemodynamics, stable hemoglobin, and significant bradycardia requiring pacemaker placement.  Completed colonoscopy in 10/2023 and notable for subcentimeter adenoma x 1 and grade 2 internal hemorrhoids.   - 12/07/2023: Banding of LL hemorrhoid - 02/21/2024: Banding of RP hemorrhoid   Endoscopic History: - 06/2015: Colonoscopy: Normal - 10/06/2023: Colonoscopy: 2 mm ascending colon adenoma, 4 mm sigmoid hyperplastic polyp, otherwise normal colon, normal TI.  Grade 2 medium size internal hemorrhoids.  Repeat in 7 years  HPI:     Patient is a 73 y.o. femalewith a history of symptomatic internal hemorrhoids presenting to [facility] for follow-up and ongoing treatment. The patient presents with symptomatic grade 1-2 hemorrhoids, unresponsive to maximal medical therapy, requesting rubber band ligation of symptomatic hemorrhoidal disease.  Has done well with hemorrhoid banding so far with improvement in hemorrhoidal symptoms.  Did have 3 separate episodes of scant BRBPR since last banding, and each of these episodes coincided with straining to have BM.  Presents today for banding #3.  No change in medical or surgical history, medications, allergies, social history since last appointment with me.   Review of systems:     No chest pain, no SOB, no fevers, no urinary sx   Past Medical History:  Diagnosis Date   Anxiety    Cataract    Chronic insomnia 10/13/2016   Depression    Gait disorder 03/14/2014    HTN (hypertension)    Hx of cardiovascular stress test    Lexiscan  Myoview (1/16):  No ischemia, EF 68%, breast atten; Low Risk   Hyperlipidemia    Phreesia 07/18/2020   Hypertension    Phreesia 07/18/2020   Memory difficulty 03/14/2014   Meningitis due to bacteria 01/18/2014   OSA (obstructive sleep apnea) 11/30/2016   Seizures (HCC)    Sleep apnea    Phreesia 07/18/2020   TIA (transient ischemic attack)     Patient's surgical history, family medical history, social history, medications and allergies were all reviewed in Epic    Current Outpatient Medications  Medication Sig Dispense Refill   acetaminophen  (TYLENOL ) 325 MG tablet Take 650 mg by mouth at bedtime as needed for moderate pain (pain score 4-6).     aspirin  81 MG chewable tablet Chew 1 tablet (81 mg total) by mouth daily.     carbamazepine  (TEGRETOL ) 200 MG tablet TAKE 1 AND 1/2 TABLETS BY MOUTH TWO TIMES A DAY 270 tablet 4   Cholecalciferol (VITAMIN D -3 PO) Take 1 capsule by mouth daily after supper.     escitalopram  (LEXAPRO ) 10 MG tablet TAKE 1 TABLET(10 MG) BY MOUTH AT BEDTIME 90 tablet 1   ezetimibe  (ZETIA ) 10 MG tablet TAKE 1 TABLET(10 MG) BY MOUTH DAILY 90 tablet 2   levETIRAcetam  (KEPPRA ) 750 MG tablet TAKE 2 TABLETS(1500 MG) BY MOUTH TWICE DAILY 360 tablet 4   lisinopril -hydrochlorothiazide  (ZESTORETIC ) 20-25 MG tablet Take 1 tablet by mouth daily. 90 tablet 1   Multiple Vitamins-Minerals (MULTIVITAMIN WOMEN 50+) TABS Take 1 tablet by mouth daily after supper.     nitroGLYCERIN  (NITROSTAT ) 0.4 MG SL tablet Place 1 tablet (0.4 mg  total) under the tongue every 5 (five) minutes as needed for chest pain. 25 tablet 1   rosuvastatin  (CRESTOR ) 40 MG tablet TAKE 1 TABLET(40 MG) BY MOUTH DAILY 90 tablet 3   No current facility-administered medications for this visit.    Physical Exam:     Ht 5' 5.5 (1.664 m)   Wt 259 lb 8 oz (117.7 kg)   BMI 42.53 kg/m   GENERAL:  Pleasant female in NAD PSYCH: : Cooperative,  normal affect NEURO: Alert and oriented x 3, no focal neurologic deficits Rectal exam: Sensation intact and preserved anal wink.  Small 1-2 hemorrhoid in RA position on exam.  No external anal fissures noted. Normal sphincter tone. No palpable mass. No blood on the exam glove. (Chaperone: Nat Sic, CMA).   IMPRESSION and PLAN:    #1.  Symptomatic internal hemorrhoids: PROCEDURE NOTE: The patient presents with symptomatic grade 1-2 hemorrhoids, unresponsive to maximal medical therapy, requesting rubber band ligation of symptomatic hemorrhoidal disease.  All risks, benefits and alternative forms of therapy were described and informed consent was obtained.  In the Left Lateral Decubitus position, anoscopic examination revealed grade 1-2 hemorrhoids in the RA position(s).  The anorectum was pre-medicated with RectiCare. The decision was made to band the RA internal hemorrhoid, and the [facility] ORegan System was used to perform band ligation without complication.  Digital anorectal examination was then performed to assure proper positioning of the band, and to adjust the banded tissue as required.  The patient was discharged home without pain or other issues.  Dietary and behavioral recommendations were given and along with follow-up instructions.     The following adjunctive treatments were recommended:  -Resume high-fiber diet with fiber supplement (i.e. Citrucel or Benefiber) with goal for soft stools without straining to have a BM. -Resume adequate fluid intake - Follow-up in the GI clinic as needed if return of hemorrhoidal symptoms for reevaluation and possible repeat banding as appropriate - Avoid straining to have bowel movement       #2.  Change in bowel habits #3.  Constipation - Continue fiber supplement - Continue adequate water intake with at least 64 ounce of water daily - If continued symptoms, start MiraLAX 1 capful daily   #4. Hx of colon polyps - Repeat colonoscopy in  10/2030 for ongoing surveillance, pending overall health and patient wishes at that time (will be age 73).       Sandor LULLA Flatter ,DO, FACG 04/12/2024, 4:08 PM

## 2024-04-20 ENCOUNTER — Other Ambulatory Visit: Payer: Self-pay | Admitting: Neurology

## 2024-04-30 ENCOUNTER — Ambulatory Visit

## 2024-04-30 DIAGNOSIS — I2510 Atherosclerotic heart disease of native coronary artery without angina pectoris: Secondary | ICD-10-CM

## 2024-05-01 LAB — CUP PACEART REMOTE DEVICE CHECK
Battery Remaining Longevity: 107 mo
Battery Remaining Percentage: 95.5 %
Battery Voltage: 3.01 V
Brady Statistic AP VP Percent: 1 %
Brady Statistic AP VS Percent: 69 %
Brady Statistic AS VP Percent: 1 %
Brady Statistic AS VS Percent: 30 %
Brady Statistic RA Percent Paced: 69 %
Brady Statistic RV Percent Paced: 1 %
Date Time Interrogation Session: 20251230002913
Implantable Lead Connection Status: 753985
Implantable Lead Connection Status: 753985
Implantable Lead Implant Date: 20250513
Implantable Lead Implant Date: 20250513
Implantable Lead Location: 753859
Implantable Lead Location: 753860
Implantable Pulse Generator Implant Date: 20250513
Lead Channel Impedance Value: 430 Ohm
Lead Channel Impedance Value: 440 Ohm
Lead Channel Pacing Threshold Amplitude: 0.75 V
Lead Channel Pacing Threshold Amplitude: 1 V
Lead Channel Pacing Threshold Pulse Width: 0.5 ms
Lead Channel Pacing Threshold Pulse Width: 0.5 ms
Lead Channel Sensing Intrinsic Amplitude: 5 mV
Lead Channel Sensing Intrinsic Amplitude: 7.5 mV
Lead Channel Setting Pacing Amplitude: 2 V
Lead Channel Setting Pacing Amplitude: 2.5 V
Lead Channel Setting Pacing Pulse Width: 0.5 ms
Lead Channel Setting Sensing Sensitivity: 0.5 mV
Pulse Gen Model: 2272
Pulse Gen Serial Number: 8269852

## 2024-05-02 ENCOUNTER — Ambulatory Visit: Payer: Self-pay | Admitting: Cardiovascular Disease

## 2024-05-07 NOTE — Progress Notes (Signed)
 Remote PPM Transmission

## 2024-05-24 ENCOUNTER — Encounter: Payer: Self-pay | Admitting: Cardiovascular Disease

## 2024-05-24 ENCOUNTER — Telehealth (HOSPITAL_BASED_OUTPATIENT_CLINIC_OR_DEPARTMENT_OTHER): Payer: Self-pay

## 2024-05-24 NOTE — Telephone Encounter (Signed)
"  ° °  Pre-operative Risk Assessment    Patient Name: [name] [name]  DOB: 1950/08/28 MRN: [id number]   Date of last office visit: 07/29/2023 with Dr. Nancey Date of next office visit: None  Request for Surgical Clearance    Procedure:  Dental Extraction - Amount of Teeth to be Pulled:  ONE tooth (possibly surgical)  Date of Surgery:  Clearance TBD                                 Surgeon:  Jerry R. Nazziola, DDS, M.S. Surgeon's Group or Practice Name:  [facility]  Phone number:  [phone] Fax number:  [phone]   Type of Clearance Requested:   - Medical  - Pharmacy:  Hold Aspirin  3-5 days prior   Type of Anesthesia:  does not specify   Additional requests/questions:  PT HAS A SJ PPM  Signed, Patrcia Iverson CROME   05/24/2024, 4:28 PM   "

## 2024-05-24 NOTE — Progress Notes (Signed)
 PERIOPERATIVE PRESCRIPTION FOR IMPLANTED CARDIAC DEVICE PROGRAMMING  Patient Information: Name:  Mycah Mcdougall  DOB:  [birth year]  MRN:  [id number]  Procedure:  Dental Extraction - Amount of Teeth to be Pulled:  ONE tooth (possibly surgical)   Date of Surgery:  Clearance TBD                                  Surgeon:  Jerry R. Nazziola, DDS, M.S. Surgeon's Group or Practice Name:  [facility]  Phone number:  [phone] Fax number:  [phone]   Type of Clearance Requested:   - Medical  - Pharmacy:  Hold Aspirin  3-5 days prior   Type of Anesthesia:  does not specify Device Information:  Clinic EP Physician:  Dr. Eulas Furbish  Device Type:  Pacemaker Manufacturer and Phone #:  [employer]: [phone] Pacemaker Dependent?:  No. Date of Last Device Check:  05/01/24 Remote  Normal Device Function?:  Yes.    Electrophysiologist's Recommendations:  Have magnet available. Provide continuous ECG monitoring when magnet is used or reprogramming is to be performed.  Procedure should not interfere with device function.  No device programming or magnet placement needed.  Per Device Clinic Standing Orders, Prentice JINNY Silvan, RN  5:23 PM 05/24/2024

## 2024-05-25 NOTE — Telephone Encounter (Signed)
 Left message for the DDS office to call us  to confirm type of anesthesia to be used, local etc.   I will fax these to their office as well. Their office is currently closed and will re-open on Monday

## 2024-05-29 NOTE — Telephone Encounter (Signed)
 I tried to call the DDS office again and they are closed due to inclement weather.

## 2024-05-30 NOTE — Telephone Encounter (Signed)
 Our office has tried x 3 to reach Dr. Nazziola office to confirm if anesthesia , local, etc being used. Office is closed. I will fax these notes to the dental office to please call our office and confirm if any anesthesia being used.   I will also send to our device dept as well for preop clearance for device.

## 2024-06-05 ENCOUNTER — Ambulatory Visit: Admitting: Pharmacist Clinician (PhC)/ Clinical Pharmacy Specialist

## 2024-06-07 ENCOUNTER — Telehealth: Payer: Self-pay

## 2024-06-07 NOTE — Telephone Encounter (Signed)
"  °  Patient Consent for Virtual Visit        [name] [name] has provided verbal consent on 06/07/2024 for a virtual visit (video or telephone).   CONSENT FOR VIRTUAL VISIT FOR:  [name] [name]  By participating in this virtual visit I agree to the following:  I hereby voluntarily request, consent and authorize [facility] and its employed or contracted physicians, physician assistants, nurse practitioners or other licensed health care professionals (the Practitioner), to provide me with telemedicine health care services (the Services) as deemed necessary by the treating Practitioner. I acknowledge and consent to receive the Services by the Practitioner via telemedicine. I understand that the telemedicine visit will involve communicating with the Practitioner through live audiovisual communication technology and the disclosure of certain medical information by electronic transmission. I acknowledge that I have been given the opportunity to request an in-person assessment or other available alternative prior to the telemedicine visit and am voluntarily participating in the telemedicine visit.  I understand that I have the right to withhold or withdraw my consent to the use of telemedicine in the course of my care at any time, without affecting my right to future care or treatment, and that the Practitioner or I may terminate the telemedicine visit at any time. I understand that I have the right to inspect all information obtained and/or recorded in the course of the telemedicine visit and may receive copies of available information for a reasonable fee.  I understand that some of the potential risks of receiving the Services via telemedicine include:  Delay or interruption in medical evaluation due to technological equipment failure or disruption; Information transmitted may not be sufficient (e.g. poor resolution of images) to allow for appropriate medical decision making by the  Practitioner; and/or  In rare instances, security protocols could fail, causing a breach of personal health information.  Furthermore, I acknowledge that it is my responsibility to provide information about my medical history, conditions and care that is complete and accurate to the best of my ability. I acknowledge that Practitioner's advice, recommendations, and/or decision may be based on factors not within their control, such as incomplete or inaccurate data provided by me or distortions of diagnostic images or specimens that may result from electronic transmissions. I understand that the practice of medicine is not an exact science and that Practitioner makes no warranties or guarantees regarding treatment outcomes. I acknowledge that a copy of this consent can be made available to me via my patient portal [facility] MyChart), or I can request a printed copy by calling the office of [facility].    I understand that my insurance will be billed for this visit.   I have read or had this consent read to me. I understand the contents of this consent, which adequately explains the benefits and risks of the Services being provided via telemedicine.  I have been provided ample opportunity to ask questions regarding this consent and the Services and have had my questions answered to my satisfaction. I give my informed consent for the services to be provided through the use of telemedicine in my medical care    "

## 2024-06-07 NOTE — Telephone Encounter (Signed)
 Preop tele appt now scheduled, med rec and consent done.

## 2024-06-07 NOTE — Telephone Encounter (Signed)
" ° °  Name: [name] [name]  DOB: 05/09/1950  MRN: [id number]  Primary Cardiologist: Lonni LITTIE Nanas, MD  Chart reviewed as part of pre-operative protocol coverage. Because of [name]'s past medical history and time since last visit, she will require a follow-up telephone visit in order to better assess preoperative cardiovascular risk.  Pre-op covering staff: - Please schedule appointment and call patient to inform them. If patient already had an upcoming appointment within acceptable timeframe, please add pre-op clearance to the appointment notes so provider is aware. - Please contact requesting surgeon's office via preferred method (i.e, phone, fax) to inform them of need for appointment prior to surgery.  She is on aspirin  for nonobstructive CAD.  As long as asymptomatic at the time of phone call she may hold her aspirin  for 3 to 5 days prior to dental procedure.  Please resume when medically safe to do so.  [name], PA-C  06/07/2024, 12:06 PM   "

## 2024-06-07 NOTE — Telephone Encounter (Signed)
 Dr. Nazziola office calling in stating the pt would be using Local anesthesia.

## 2024-06-13 ENCOUNTER — Ambulatory Visit

## 2024-07-17 ENCOUNTER — Ambulatory Visit: Admitting: Pharmacist Clinician (PhC)/ Clinical Pharmacy Specialist

## 2024-07-30 ENCOUNTER — Encounter

## 2024-08-09 ENCOUNTER — Ambulatory Visit: Admitting: Emergency Medicine

## 2024-10-22 ENCOUNTER — Ambulatory Visit

## 2024-10-29 ENCOUNTER — Encounter

## 2024-11-01 ENCOUNTER — Ambulatory Visit: Admitting: Neurology
